# Patient Record
Sex: Female | Born: 1965 | Race: Black or African American | Hispanic: No | Smoking: Current every day smoker
Health system: Southern US, Community
[De-identification: ages and names within clinical notes are randomized; demographics above are authoritative.]

## PROBLEM LIST (undated history)

## (undated) VITALS — BP 93/71 | HR 100 | Temp 97.8°F | Resp 16 | Ht 62.0 in | Wt 146.0 lb

## (undated) DIAGNOSIS — R569 Unspecified convulsions: Secondary | ICD-10-CM

---

## 1997-12-31 ENCOUNTER — Emergency Department (HOSPITAL_COMMUNITY): Admission: EM | Admit: 1997-12-31 | Discharge: 1997-12-31 | Payer: Self-pay | Admitting: Emergency Medicine

## 1998-07-31 ENCOUNTER — Emergency Department (HOSPITAL_COMMUNITY): Admission: EM | Admit: 1998-07-31 | Discharge: 1998-07-31 | Payer: Self-pay | Admitting: Emergency Medicine

## 1998-08-19 ENCOUNTER — Emergency Department (HOSPITAL_COMMUNITY): Admission: EM | Admit: 1998-08-19 | Discharge: 1998-08-19 | Payer: Self-pay | Admitting: Emergency Medicine

## 1999-03-12 ENCOUNTER — Emergency Department (HOSPITAL_COMMUNITY): Admission: EM | Admit: 1999-03-12 | Discharge: 1999-03-12 | Payer: Self-pay | Admitting: Emergency Medicine

## 1999-03-12 ENCOUNTER — Encounter: Payer: Self-pay | Admitting: Emergency Medicine

## 2000-06-22 ENCOUNTER — Emergency Department (HOSPITAL_COMMUNITY): Admission: EM | Admit: 2000-06-22 | Discharge: 2000-06-22 | Payer: Self-pay | Admitting: Emergency Medicine

## 2003-09-05 ENCOUNTER — Emergency Department (HOSPITAL_COMMUNITY): Admission: EM | Admit: 2003-09-05 | Discharge: 2003-09-05 | Payer: Self-pay | Admitting: Emergency Medicine

## 2005-03-18 ENCOUNTER — Emergency Department (HOSPITAL_COMMUNITY): Admission: EM | Admit: 2005-03-18 | Discharge: 2005-03-18 | Payer: Self-pay | Admitting: Emergency Medicine

## 2005-08-03 ENCOUNTER — Emergency Department (HOSPITAL_COMMUNITY): Admission: EM | Admit: 2005-08-03 | Discharge: 2005-08-03 | Payer: Self-pay | Admitting: Emergency Medicine

## 2005-12-20 ENCOUNTER — Emergency Department (HOSPITAL_COMMUNITY): Admission: EM | Admit: 2005-12-20 | Discharge: 2005-12-20 | Payer: Self-pay | Admitting: Emergency Medicine

## 2006-09-19 ENCOUNTER — Emergency Department (HOSPITAL_COMMUNITY): Admission: EM | Admit: 2006-09-19 | Discharge: 2006-09-19 | Payer: Self-pay | Admitting: Emergency Medicine

## 2007-02-22 ENCOUNTER — Emergency Department (HOSPITAL_COMMUNITY): Admission: EM | Admit: 2007-02-22 | Discharge: 2007-02-22 | Payer: Self-pay | Admitting: *Deleted

## 2007-02-24 ENCOUNTER — Emergency Department (HOSPITAL_COMMUNITY): Admission: EM | Admit: 2007-02-24 | Discharge: 2007-02-24 | Payer: Self-pay | Admitting: Emergency Medicine

## 2007-04-02 ENCOUNTER — Emergency Department (HOSPITAL_COMMUNITY): Admission: EM | Admit: 2007-04-02 | Discharge: 2007-04-02 | Payer: Self-pay | Admitting: Emergency Medicine

## 2008-04-04 ENCOUNTER — Emergency Department (HOSPITAL_COMMUNITY): Admission: EM | Admit: 2008-04-04 | Discharge: 2008-04-04 | Payer: Self-pay | Admitting: Emergency Medicine

## 2008-04-14 ENCOUNTER — Emergency Department (HOSPITAL_COMMUNITY): Admission: EM | Admit: 2008-04-14 | Discharge: 2008-04-14 | Payer: Self-pay | Admitting: Emergency Medicine

## 2008-10-26 ENCOUNTER — Emergency Department (HOSPITAL_COMMUNITY): Admission: EM | Admit: 2008-10-26 | Discharge: 2008-10-26 | Payer: Self-pay | Admitting: Emergency Medicine

## 2010-12-25 LAB — URINALYSIS, ROUTINE W REFLEX MICROSCOPIC
Bilirubin Urine: NEGATIVE
Glucose, UA: NEGATIVE mg/dL
Hgb urine dipstick: NEGATIVE
Ketones, ur: NEGATIVE mg/dL
Nitrite: NEGATIVE
Protein, ur: NEGATIVE mg/dL
Specific Gravity, Urine: 1.022 (ref 1.005–1.030)
Urobilinogen, UA: 1 mg/dL (ref 0.0–1.0)
pH: 5.5 (ref 5.0–8.0)

## 2010-12-25 LAB — URINE MICROSCOPIC-ADD ON

## 2011-03-02 ENCOUNTER — Emergency Department (HOSPITAL_COMMUNITY)
Admission: EM | Admit: 2011-03-02 | Discharge: 2011-03-02 | Disposition: A | Discharge status: Home / Self Care | Payer: Self-pay | Attending: Emergency Medicine | Admitting: Emergency Medicine

## 2011-03-02 DIAGNOSIS — R6884 Jaw pain: Secondary | ICD-10-CM | POA: Insufficient documentation

## 2011-03-02 DIAGNOSIS — L2989 Other pruritus: Secondary | ICD-10-CM | POA: Insufficient documentation

## 2011-03-02 DIAGNOSIS — K029 Dental caries, unspecified: Secondary | ICD-10-CM | POA: Insufficient documentation

## 2011-03-02 DIAGNOSIS — R220 Localized swelling, mass and lump, head: Secondary | ICD-10-CM | POA: Insufficient documentation

## 2011-03-02 DIAGNOSIS — R599 Enlarged lymph nodes, unspecified: Secondary | ICD-10-CM | POA: Insufficient documentation

## 2011-03-02 DIAGNOSIS — K089 Disorder of teeth and supporting structures, unspecified: Secondary | ICD-10-CM | POA: Insufficient documentation

## 2011-03-02 DIAGNOSIS — L298 Other pruritus: Secondary | ICD-10-CM | POA: Insufficient documentation

## 2011-03-02 DIAGNOSIS — R059 Cough, unspecified: Secondary | ICD-10-CM | POA: Insufficient documentation

## 2011-03-02 DIAGNOSIS — R05 Cough: Secondary | ICD-10-CM | POA: Insufficient documentation

## 2011-06-24 LAB — RAPID STREP SCREEN (MED CTR MEBANE ONLY): Streptococcus, Group A Screen (Direct): POSITIVE — AB

## 2011-06-26 LAB — WOUND CULTURE

## 2011-10-03 ENCOUNTER — Encounter (HOSPITAL_COMMUNITY): Payer: Self-pay | Admitting: *Deleted

## 2011-10-03 ENCOUNTER — Emergency Department (HOSPITAL_COMMUNITY)
Admission: EM | Admit: 2011-10-03 | Discharge: 2011-10-03 | Disposition: A | Discharge status: Home / Self Care | Payer: Self-pay | Attending: Emergency Medicine | Admitting: Emergency Medicine

## 2011-10-03 DIAGNOSIS — K089 Disorder of teeth and supporting structures, unspecified: Secondary | ICD-10-CM | POA: Insufficient documentation

## 2011-10-03 DIAGNOSIS — K047 Periapical abscess without sinus: Secondary | ICD-10-CM | POA: Insufficient documentation

## 2011-10-03 DIAGNOSIS — R220 Localized swelling, mass and lump, head: Secondary | ICD-10-CM | POA: Insufficient documentation

## 2011-10-03 MED ORDER — PENICILLIN V POTASSIUM 500 MG PO TABS: 500.0000 mg | ORAL_TABLET | Freq: Three times a day (TID) | ORAL | Status: AC

## 2011-10-03 MED ORDER — HYDROCODONE-ACETAMINOPHEN 5-500 MG PO TABS: 1.0000 | ORAL_TABLET | Freq: Four times a day (QID) | ORAL | Status: AC | PRN

## 2011-10-03 NOTE — ED Notes (Signed)
Pt began having dental pain yesterday.  Pain in left lower jaw.  Pt has increased pain and swelling today and is not able to open her mouth for me to visulaize the affected tooth.  Pt denies any fever or chills with this.  No sob with this.  Pt reports 10/10 pain in this tooth.

## 2011-10-03 NOTE — ED Notes (Signed)
Pt states that she has facial swelling and a left lower molar pain. States that it is a "very bad cavity".  Unable to visualize tooth because she cannot open mouth face enough

## 2011-10-03 NOTE — ED Provider Notes (Signed)
History     CSN: [id number]  Arrival date & time 10/03/11  1223   First MD Initiated Contact with Patient 10/03/11 1359      Chief Complaint  Patient presents with  . Dental Pain    (Consider location/radiation/quality/duration/timing/severity/associated sxs/prior treatment) HPI Comments: Patient has been having a several week history of pain to her left lower tooth. Yesterday she started noticing some swelling to that area. Denies any nausea no vomiting no fevers no drainage from the area. She has not yet seen a dentist. She has been taking some Tylenol without relief.  Patient is a 46 y.o. female presenting with tooth pain. The history is provided by the patient.  Dental PainThe primary symptoms include mouth pain. Primary symptoms do not include headaches, fever or shortness of breath.    History reviewed. No pertinent past medical history.  History reviewed. No pertinent past surgical history.  No family history on file.  History  Substance Use Topics  . Smoking status: Current Everyday Smoker -- 0.5 packs/day    Types: Cigarettes  . Smokeless tobacco: Not on file  . Alcohol Use: Yes    OB History    Grav Para Term Preterm Abortions TAB SAB Ect Mult Living                  Review of Systems  Constitutional: Negative for fever.  HENT: Negative for congestion, rhinorrhea and sneezing.   Eyes: Negative.   Respiratory: Negative for shortness of breath.   Cardiovascular: Negative for chest pain.  Gastrointestinal: Negative for nausea, vomiting and abdominal pain.  Genitourinary: Negative.   Musculoskeletal: Negative.   Skin: Negative for rash.  Neurological: Negative for headaches.    Allergies  Review of patient's allergies indicates no known allergies.  Home Medications   Current Outpatient Rx  Name Route Sig Dispense Refill  . ACETAMINOPHEN 325 MG PO TABS Oral Take 650 mg by mouth every 6 (six) hours as needed. For pain    . HYDROCODONE-ACETAMINOPHEN  5-500 MG PO TABS Oral Take 1-2 tablets by mouth every 6 (six) hours as needed for pain. 15 tablet 0  . PENICILLIN V POTASSIUM 500 MG PO TABS Oral Take 1 tablet (500 mg total) by mouth 3 (three) times daily. 30 tablet 0    BP 138/91  Pulse 96  Temp(Src) 97.8 F (36.6 C) (Oral)  Resp 20  Ht 5' 2.5" (1.588 m)  Wt 151 lb (68.493 kg)  BMI 27.18 kg/m2  SpO2 97%  LMP 09/29/2010  Physical Exam  Constitutional: She is oriented to person, place, and time. She appears well-developed and well-nourished.  HENT:  Head: Normocephalic and atraumatic.       Positive tenderness and decayed tooth involving the left lower first molar. There is some swelling and induration around that area with some improvement in drainage around the tooth. There some mild facial swelling around this area no trismus uvula is midline.  Eyes: Pupils are equal, round, and reactive to light.  Neck: Normal range of motion. Neck supple.  Cardiovascular: Normal rate, regular rhythm and normal heart sounds.   Pulmonary/Chest: Effort normal and breath sounds normal. No respiratory distress.  Abdominal: Soft. Bowel sounds are normal. There is no tenderness.  Musculoskeletal: Normal range of motion. She exhibits no edema.  Lymphadenopathy:    She has no cervical adenopathy.  Neurological: She is alert and oriented to person, place, and time.  Skin: Skin is warm and dry. No rash noted.  Psychiatric: She has  a normal mood and affect.    ED Course  Procedures (including critical care time)  Labs Reviewed - No data to display No results found.   1. Dental abscess       MDM  Patient with small periapical abscess. Will start antibiotics pain medicines encourage followup with Dentist. Return for any worsening swelling or other worsening complaints        Rolan Bucco, MD 10/03/11 1413

## 2012-04-05 ENCOUNTER — Encounter (HOSPITAL_COMMUNITY): Payer: Self-pay | Admitting: Emergency Medicine

## 2012-04-05 ENCOUNTER — Emergency Department (HOSPITAL_COMMUNITY)
Admission: EM | Admit: 2012-04-05 | Discharge: 2012-04-05 | Disposition: A | Discharge status: Home / Self Care | Payer: Self-pay | Attending: Emergency Medicine | Admitting: Emergency Medicine

## 2012-04-05 DIAGNOSIS — F17200 Nicotine dependence, unspecified, uncomplicated: Secondary | ICD-10-CM | POA: Insufficient documentation

## 2012-04-05 DIAGNOSIS — M62838 Other muscle spasm: Secondary | ICD-10-CM | POA: Insufficient documentation

## 2012-04-05 DIAGNOSIS — R51 Headache: Secondary | ICD-10-CM | POA: Insufficient documentation

## 2012-04-05 MED ORDER — DIAZEPAM 5 MG PO TABS
5.0000 mg | ORAL_TABLET | Freq: Once | ORAL | Status: AC
  Administered 2012-04-05: 5 mg via ORAL
  Filled 2012-04-05: qty 1

## 2012-04-05 MED ORDER — DIAZEPAM 5 MG PO TABS: 5.0000 mg | ORAL_TABLET | Freq: Three times a day (TID) | ORAL | Status: AC | PRN

## 2012-04-05 NOTE — ED Provider Notes (Signed)
History     CSN: [id number]  Arrival date & time 04/05/12  0455   First MD Initiated Contact with Patient 04/05/12 [phone]      Chief Complaint  Patient presents with  . Neck Pain    (Consider location/radiation/quality/duration/timing/severity/associated sxs/prior treatment) HPI Comments: Patient reports she has had three weeks of left sided neck pain resulting in headache.  Headache is occipital.  Pain is worse with turning head from side to side, moving neck.  Has taken acetaminophen without relief.  States she has been very stressed recently and worried about her sister recently.  Denies fevers, trauma, recent illness, focal neurological deficits.    Patient is a 46 y.o. female presenting with neck pain. The history is provided by the patient and the spouse.  Neck Pain  Pertinent negatives include no chest pain.    History reviewed. No pertinent past medical history.  History reviewed. No pertinent past surgical history.  History reviewed. No pertinent family history.  History  Substance Use Topics  . Smoking status: Current Everyday Smoker -- 0.5 packs/day    Types: Cigarettes  . Smokeless tobacco: Not on file  . Alcohol Use: Yes    OB History    Grav Para Term Preterm Abortions TAB SAB Ect Mult Living                  Review of Systems  Constitutional: Negative for fever.  HENT: Positive for neck pain. Negative for sore throat and dental problem.   Respiratory: Negative for cough and shortness of breath.   Cardiovascular: Negative for chest pain.    Allergies  Review of patient's allergies indicates no known allergies.  Home Medications   Current Outpatient Rx  Name Route Sig Dispense Refill  . IBUPROFEN 200 MG PO TABS Oral Take 400 mg by mouth every 8 (eight) hours as needed. For pain      BP 133/97  Pulse 90  Temp 97.6 F (36.4 C) (Oral)  Resp 16  SpO2 100%  Physical Exam  Nursing note and vitals reviewed. Constitutional: She is oriented to  person, place, and time. She appears well-developed and well-nourished. No distress.  HENT:  Head: Normocephalic and atraumatic.    Mouth/Throat: Oropharynx is clear and moist. No oropharyngeal exudate.  Eyes: Conjunctivae are normal.  Neck: Normal range of motion. Neck supple.  Cardiovascular: Normal rate and regular rhythm.   Pulmonary/Chest: Effort normal and breath sounds normal. No stridor. No respiratory distress. She has no wheezes. She has no rales.  Musculoskeletal:       Cervical back: She exhibits no bony tenderness.       Thoracic back: She exhibits no bony tenderness.       Lumbar back: She exhibits no bony tenderness.  Lymphadenopathy:    She has no cervical adenopathy.  Neurological: She is alert and oriented to person, place, and time. She has normal strength. No sensory deficit. She exhibits normal muscle tone. Coordination and gait normal. GCS eye subscore is 4. GCS verbal subscore is 5. GCS motor subscore is 6.  Skin: She is not diaphoretic.    ED Course  Procedures (including critical care time)  Labs Reviewed - No data to display No results found.   1. Muscle spasm   2. Headache       MDM  Patient with muscle spasm of left neck radiating into occiput causing tension headache. No meningeal signs, no trauma.  Neurologically intact.  Ongoing for three weeks. No red flags  for headache.  Discussed plan and follow up with patient and spouse.  Pt given return precautions.  Pt verbalizes understanding and agrees with plan.           [place], [place] 04/05/12 [phone]

## 2012-04-05 NOTE — ED Notes (Signed)
Patient with neck pain and headache.  She states that it radiates from of her neck and up around to front.  No nausea or vomiting.  No double vision.

## 2012-04-05 NOTE — ED Provider Notes (Signed)
Medical screening examination/treatment/procedure(s) were performed by non-physician practitioner and as supervising physician I was immediately available for consultation/collaboration.  Olivia Mackie, MD 04/05/12 [phone]

## 2014-05-17 ENCOUNTER — Encounter (HOSPITAL_COMMUNITY): Payer: Self-pay | Admitting: Emergency Medicine

## 2014-05-17 ENCOUNTER — Emergency Department (HOSPITAL_COMMUNITY)
Admission: EM | Admit: 2014-05-17 | Discharge: 2014-05-17 | Discharge status: Against Medical Advice | Payer: BC Managed Care – PPO | Attending: Emergency Medicine | Admitting: Emergency Medicine

## 2014-05-17 DIAGNOSIS — K029 Dental caries, unspecified: Secondary | ICD-10-CM | POA: Insufficient documentation

## 2014-05-17 DIAGNOSIS — K089 Disorder of teeth and supporting structures, unspecified: Secondary | ICD-10-CM | POA: Diagnosis not present

## 2014-05-17 DIAGNOSIS — F17200 Nicotine dependence, unspecified, uncomplicated: Secondary | ICD-10-CM | POA: Diagnosis not present

## 2014-05-17 NOTE — ED Notes (Signed)
Pt. reports progressing left lower molar cavity/ pain onset 2 days ago .

## 2014-05-17 NOTE — ED Notes (Signed)
Patient turned in pager and left after triage and before being seen by a physician.

## 2015-06-25 ENCOUNTER — Emergency Department (HOSPITAL_COMMUNITY): Payer: BLUE CROSS/BLUE SHIELD

## 2015-06-25 ENCOUNTER — Emergency Department (HOSPITAL_COMMUNITY)
Admission: EM | Admit: 2015-06-25 | Discharge: 2015-06-25 | Disposition: A | Discharge status: Home / Self Care | Payer: BLUE CROSS/BLUE SHIELD | Attending: Emergency Medicine | Admitting: Emergency Medicine

## 2015-06-25 ENCOUNTER — Encounter (HOSPITAL_COMMUNITY): Payer: Self-pay | Admitting: Emergency Medicine

## 2015-06-25 DIAGNOSIS — X58XXXA Exposure to other specified factors, initial encounter: Secondary | ICD-10-CM | POA: Insufficient documentation

## 2015-06-25 DIAGNOSIS — Y9389 Activity, other specified: Secondary | ICD-10-CM | POA: Insufficient documentation

## 2015-06-25 DIAGNOSIS — Z720 Tobacco use: Secondary | ICD-10-CM | POA: Diagnosis not present

## 2015-06-25 DIAGNOSIS — Y9289 Other specified places as the place of occurrence of the external cause: Secondary | ICD-10-CM | POA: Insufficient documentation

## 2015-06-25 DIAGNOSIS — S8991XA Unspecified injury of right lower leg, initial encounter: Secondary | ICD-10-CM | POA: Insufficient documentation

## 2015-06-25 DIAGNOSIS — Y998 Other external cause status: Secondary | ICD-10-CM | POA: Insufficient documentation

## 2015-06-25 MED ORDER — TRAMADOL HCL 50 MG PO TABS: 50.0000 mg | ORAL_TABLET | Freq: Four times a day (QID) | ORAL | Status: DC | PRN

## 2015-06-25 MED ORDER — TRAMADOL HCL 50 MG PO TABS
50.0000 mg | ORAL_TABLET | Freq: Once | ORAL | Status: AC
  Administered 2015-06-25: 50 mg via ORAL
  Filled 2015-06-25: qty 1

## 2015-06-25 NOTE — ED Notes (Signed)
Hot pack applied to R knee per pt request

## 2015-06-25 NOTE — Discharge Instructions (Signed)
Please contact orthopedics immediately for follow-up evaluation. Please use medications only as directed, return to emergency room if Worsening signs or symptoms present

## 2015-06-25 NOTE — ED Provider Notes (Signed)
CSN: [id number]     Arrival date & time 06/25/15  0544 History   First MD Initiated Contact with Patient 06/25/15 [phone]     Chief Complaint  Patient presents with  . Knee Pain   HPI   49 year old female presents with knee pain. Patient reports that approximately one month ago she fell landing on her right anterior knee, she reports immediate pain and swelling at that time with difficulty ambulating. Notes that symptoms improved but did not completely resolved, but have progressively became worse over the last week. She reports pain, swelling, difficulty with walking. She states that she has continued to work, but this is becoming very difficult. She denies any loss of sensation, strength, function of the distal extremity other than those due to pain. Patient denies any significant past medical history of trauma to the right knee other than those noted above. Patient reports she is able to flex the knee to approximately 90 but has significant pain due to swelling. Patient reports she's been using icy hot at home without symptomatic improvement. Patient denies fever, chills, nausea, vomiting, or any other concerning signs or symptoms  History reviewed. No pertinent past medical history. History reviewed. No pertinent past surgical history. History reviewed. No pertinent family history. Social History  Substance Use Topics  . Smoking status: Current Every Day Smoker -- 1.00 packs/day    Types: Cigarettes  . Smokeless tobacco: None  . Alcohol Use: Yes     Comment: occasionally   OB History    No data available     Review of Systems  All other systems reviewed and are negative.   Allergies  Review of patient's allergies indicates no known allergies.  Home Medications   Prior to Admission medications   Medication Sig Start Date End Date Taking? Authorizing Provider  ibuprofen (ADVIL,MOTRIN) 200 MG tablet Take 400 mg by mouth every 8 (eight) hours as needed. For pain    Historical  Provider, MD  traMADol (ULTRAM) 50 MG tablet Take 1 tablet (50 mg total) by mouth every 6 (six) hours as needed. 06/25/15   Zakiah Beckerman, PA-C   BP 105/58 mmHg  Pulse 92  Temp(Src) 98.8 F (37.1 C) (Oral)  Resp 18  Ht 5\' 2"  (1.575 m)  Wt 153 lb (69.4 kg)  BMI 27.98 kg/m2  SpO2 97%   Physical Exam  Constitutional: She is oriented to person, place, and time. She appears well-developed and well-nourished.  HENT:  Head: Normocephalic and atraumatic.  Eyes: Conjunctivae are normal. Pupils are equal, round, and reactive to light. Right eye exhibits no discharge. Left eye exhibits no discharge. No scleral icterus.  Neck: Normal range of motion. No JVD present. No tracheal deviation present.  Pulmonary/Chest: Effort normal. No stridor.  Musculoskeletal:  Obvious swelling to the right knee, tenderness to palpation of the anterior knee diffusely. Difficult exam due to pain, unable to assess Lachman's or posterior drawer, no gross abnormalities noted. Valgus varus normal. Distal sensation, cap refill, strength intact. Pedal pulses 2+. Not warm to touch, no redness, no other signs of infection. Flexion 90  Neurological: She is alert and oriented to person, place, and time. Coordination normal.  Skin: Skin is warm and dry.  Psychiatric: She has a normal mood and affect. Her behavior is normal. Judgment and thought content normal.  Nursing note and vitals reviewed.   ED Course  Procedures (including critical care time) Labs Review Labs Reviewed - No data to display  Imaging Review Dg Knee Complete 4  Views Right  06/25/2015  CLINICAL DATA:  Right knee pain for 1 month EXAM: RIGHT KNEE - COMPLETE 4+ VIEW COMPARISON:  None. FINDINGS: There is no evidence of fracture, dislocation, or joint effusion. There is no evidence of arthropathy or other focal bone abnormality. Soft tissues are unremarkable. IMPRESSION: Negative. Electronically Signed   By: Burman Nieves M.D.   On: 06/25/2015 06:57    I have personally reviewed and evaluated these images and lab results as part of my medical decision-making.   EKG Interpretation None      MDM   Final diagnoses:  Knee injury, right, initial encounter    Labs:  Imaging: DG knee- No significant findings  Consults:  Therapeutics:  Discharge Meds: Tramadol, ibuprofen  Assessment/Plan:Patient presents with obvious knee injury. Unable to completely evaluate knee due to pain, suspect significant ligamentous or meniscus injury. No signs of septic or gouty joint. Patient will be placed in a knee immobilizer, given crutches, and orthopedic follow-up information. I discussed the importance of immediate follow-up with orthopedic as delaying follow-up could result in disability to the knee. She is instructed to use the knee immobilizer when walking, keep knee elevated and extended with no pillow under the knee but rather under the heel.. Patient requests to go back to work, she was given light duty. Patient struck to use medication only as directed, follow-up with orthopedics, she is given return precautions.         Eyvonne Mechanic, PA-C 06/25/15 0730  Geoffery Lyons, MD 06/25/15 1004

## 2015-06-25 NOTE — Progress Notes (Signed)
Orthopedic Tech Progress Note Patient Details:  Theresa Rios 08-27-1966 [id number]  Ortho Devices Type of Ortho Device: Knee Immobilizer, Crutches Ortho Device/Splint Interventions: Application   [name] 06/25/2015, 7:58 AM

## 2015-06-25 NOTE — ED Notes (Addendum)
Pt reports fall on RIGHT knee one month ago that caused pain and swelling, symptoms improved but now worse; pt reports pain, swelling, and difficulty walking today; CMS intact distally; pt ambulatory to room from triage

## 2015-11-09 ENCOUNTER — Emergency Department (HOSPITAL_COMMUNITY)
Admission: EM | Admit: 2015-11-09 | Discharge: 2015-11-10 | Disposition: A | Discharge status: Home / Self Care | Payer: BLUE CROSS/BLUE SHIELD | Attending: Emergency Medicine | Admitting: Emergency Medicine

## 2015-11-09 ENCOUNTER — Encounter (HOSPITAL_COMMUNITY): Payer: Self-pay | Admitting: *Deleted

## 2015-11-09 ENCOUNTER — Emergency Department (HOSPITAL_COMMUNITY): Payer: BLUE CROSS/BLUE SHIELD

## 2015-11-09 DIAGNOSIS — F17210 Nicotine dependence, cigarettes, uncomplicated: Secondary | ICD-10-CM | POA: Insufficient documentation

## 2015-11-09 DIAGNOSIS — J9801 Acute bronchospasm: Secondary | ICD-10-CM | POA: Insufficient documentation

## 2015-11-09 DIAGNOSIS — J069 Acute upper respiratory infection, unspecified: Secondary | ICD-10-CM | POA: Diagnosis not present

## 2015-11-09 DIAGNOSIS — S8991XA Unspecified injury of right lower leg, initial encounter: Secondary | ICD-10-CM | POA: Diagnosis not present

## 2015-11-09 DIAGNOSIS — Y998 Other external cause status: Secondary | ICD-10-CM | POA: Insufficient documentation

## 2015-11-09 DIAGNOSIS — R079 Chest pain, unspecified: Secondary | ICD-10-CM | POA: Diagnosis present

## 2015-11-09 DIAGNOSIS — Y9289 Other specified places as the place of occurrence of the external cause: Secondary | ICD-10-CM | POA: Insufficient documentation

## 2015-11-09 DIAGNOSIS — Y9389 Activity, other specified: Secondary | ICD-10-CM | POA: Insufficient documentation

## 2015-11-09 DIAGNOSIS — M25561 Pain in right knee: Secondary | ICD-10-CM

## 2015-11-09 DIAGNOSIS — R0789 Other chest pain: Secondary | ICD-10-CM

## 2015-11-09 DIAGNOSIS — W010XXA Fall on same level from slipping, tripping and stumbling without subsequent striking against object, initial encounter: Secondary | ICD-10-CM | POA: Diagnosis not present

## 2015-11-09 LAB — CBC
HCT: 45.8 % (ref 36.0–46.0)
Hemoglobin: 15 g/dL (ref 12.0–15.0)
MCH: 28.2 pg (ref 26.0–34.0)
MCHC: 32.8 g/dL (ref 30.0–36.0)
MCV: 86.3 fL (ref 78.0–100.0)
PLATELETS: 229 10*3/uL (ref 150–400)
RBC: 5.31 MIL/uL — AB (ref 3.87–5.11)
RDW: 13.5 % (ref 11.5–15.5)
WBC: 10.4 10*3/uL (ref 4.0–10.5)

## 2015-11-09 LAB — BASIC METABOLIC PANEL
Anion gap: 12 (ref 5–15)
BUN: 10 mg/dL (ref 6–20)
CALCIUM: 9.4 mg/dL (ref 8.9–10.3)
CHLORIDE: 97 mmol/L — AB (ref 101–111)
CO2: 24 mmol/L (ref 22–32)
CREATININE: 0.75 mg/dL (ref 0.44–1.00)
GFR calc non Af Amer: >60 mL/min (ref >60)
Glucose, Bld: 123 mg/dL — ABNORMAL HIGH (ref 65–99)
Potassium: 3.8 mmol/L (ref 3.5–5.1)
SODIUM: 133 mmol/L — AB (ref 135–145)

## 2015-11-09 LAB — I-STAT TROPONIN, ED: TROPONIN I, POC: 0 ng/mL (ref 0.00–0.08)

## 2015-11-09 NOTE — ED Notes (Signed)
Pt complains of pain in her right knee for the past 2 weeks. Pt also complains of cough and chest tightness for the past 2 weeks. Pt states she has been coughing green mucus for the past 2 weeks.

## 2015-11-09 NOTE — ED Notes (Signed)
Holding off on EKG due to extended cough

## 2015-11-10 MED ORDER — ALBUTEROL SULFATE HFA 108 (90 BASE) MCG/ACT IN AERS
4.0000 | INHALATION_SPRAY | Freq: Once | RESPIRATORY_TRACT | Status: AC
  Administered 2015-11-10: 4 via RESPIRATORY_TRACT
  Filled 2015-11-10: qty 6.7

## 2015-11-10 MED ORDER — METHYLPREDNISOLONE SODIUM SUCC 125 MG IJ SOLR
125.0000 mg | Freq: Once | INTRAMUSCULAR | Status: AC
  Administered 2015-11-10: 125 mg via INTRAVENOUS
  Filled 2015-11-10: qty 2

## 2015-11-10 MED ORDER — KETOROLAC TROMETHAMINE 30 MG/ML IJ SOLN
30.0000 mg | Freq: Once | INTRAMUSCULAR | Status: AC
  Administered 2015-11-10: 30 mg via INTRAVENOUS
  Filled 2015-11-10: qty 1

## 2015-11-10 MED ORDER — GUAIFENESIN-CODEINE 100-10 MG/5ML PO SOLN
10.0000 mL | Freq: Once | ORAL | Status: AC
  Administered 2015-11-10: 10 mL via ORAL
  Filled 2015-11-10: qty 10

## 2015-11-10 MED ORDER — IBUPROFEN 800 MG PO TABS: 800.0000 mg | ORAL_TABLET | Freq: Three times a day (TID) | ORAL | Status: DC | PRN

## 2015-11-10 MED ORDER — GUAIFENESIN-CODEINE 100-10 MG/5ML PO SOLN: 5.0000 mL | Freq: Four times a day (QID) | ORAL | Status: DC | PRN

## 2015-11-10 MED ORDER — PREDNISONE 20 MG PO TABS: 60.0000 mg | ORAL_TABLET | Freq: Every day | ORAL | Status: DC

## 2015-11-10 NOTE — Discharge Instructions (Signed)
You may use your albuterol inhaler 2 to 4 puffs every 2 to 4 hours as needed for wheezing, cough, shortness of breath.   Bronchospasm, Adult A bronchospasm is a spasm or tightening of the airways going into the lungs. During a bronchospasm breathing becomes more difficult because the airways get smaller. When this happens there can be coughing, a whistling sound when breathing (wheezing), and difficulty breathing. Bronchospasm is often associated with asthma, but not all patients who experience a bronchospasm have asthma. CAUSES  A bronchospasm is caused by inflammation or irritation of the airways. The inflammation or irritation may be triggered by:   Allergies (such as to animals, pollen, food, or mold). Allergens that cause bronchospasm may cause wheezing immediately after exposure or many hours later.   Infection. Viral infections are believed to be the most common cause of bronchospasm.   Exercise.   Irritants (such as pollution, cigarette smoke, strong odors, aerosol sprays, and paint fumes).   Weather changes. Winds increase molds and pollens in the air. Rain refreshes the air by washing irritants out. Cold air may cause inflammation.   Stress and emotional upset.  SIGNS AND SYMPTOMS   Wheezing.   Excessive nighttime coughing.   Frequent or severe coughing with a simple cold.   Chest tightness.   Shortness of breath.  DIAGNOSIS  Bronchospasm is usually diagnosed through a history and physical exam. Tests, such as chest X-rays, are sometimes done to look for other conditions. TREATMENT   Inhaled medicines can be given to open up your airways and help you breathe. The medicines can be given using either an inhaler or a nebulizer machine.  Corticosteroid medicines may be given for severe bronchospasm, usually when it is associated with asthma. HOME CARE INSTRUCTIONS   Always have a plan prepared for seeking medical care. Know when to call your health care  provider and local emergency services (911 in the [place] Know where you can access local emergency care.  Only take medicines as directed by your health care provider.  If you were prescribed an inhaler or nebulizer machine, ask your health care provider to explain how to use it correctly. Always use a spacer with your inhaler if you were given one.  It is necessary to remain calm during an attack. Try to relax and breathe more slowly.  Control your home environment in the following ways:   Change your heating and air conditioning filter at least once a month.   Limit your use of fireplaces and wood stoves.  Do not smoke and do not allow smoking in your home.   Avoid exposure to perfumes and fragrances.   Get rid of pests (such as roaches and mice) and their droppings.   Throw away plants if you see mold on them.   Keep your house clean and dust free.   Replace carpet with wood, tile, or vinyl flooring. Carpet can trap dander and dust.   Use allergy-proof pillows, mattress covers, and box spring covers.   Wash bed sheets and blankets every week in hot water and dry them in a dryer.   Use blankets that are made of polyester or cotton.   Wash hands frequently. SEEK MEDICAL CARE IF:   You have muscle aches.   You have chest pain.   The sputum changes from clear or white to yellow, green, gray, or bloody.   The sputum you cough up gets thicker.   There are problems that may be related to the medicine you  are given, such as a rash, itching, swelling, or trouble breathing.  SEEK IMMEDIATE MEDICAL CARE IF:   You have worsening wheezing and coughing even after taking your prescribed medicines.   You have increased difficulty breathing.   You develop severe chest pain. MAKE SURE YOU:   Understand these instructions.  Will watch your condition.  Will get help right away if you are not doing well or get worse.   This information is not intended to  replace advice given to you by your health care provider. Make sure you discuss any questions you have with your health care provider.   Document Released: 08/29/2003 Document Revised: 09/16/2014 Document Reviewed: 02/15/2013 Elsevier Interactive Patient Education 2016 Elsevier Inc[employer]  Upper Respiratory Infection, Adult Most upper respiratory infections (URIs) are a viral infection of the air passages leading to the lungs. A URI affects the nose, throat, and upper air passages. The most common type of URI is nasopharyngitis and is typically referred to as "the common cold." URIs run their course and usually go away on their own. Most of the time, a URI does not require medical attention, but sometimes a bacterial infection in the upper airways can follow a viral infection. This is called a secondary infection. Sinus and middle ear infections are common types of secondary upper respiratory infections. Bacterial pneumonia can also complicate a URI. A URI can worsen asthma and chronic obstructive pulmonary disease (COPD). Sometimes, these complications can require emergency medical care and may be life threatening.  CAUSES Almost all URIs are caused by viruses. A virus is a type of germ and can spread from one person to another.  RISKS FACTORS You may be at risk for a URI if:   You smoke.   You have chronic heart or lung disease.  You have a weakened defense (immune) system.   You are very young or very old.   You have nasal allergies or asthma.  You work in crowded or poorly ventilated areas.  You work in health care facilities or schools. SIGNS AND SYMPTOMS  Symptoms typically develop 2-3 days after you come in contact with a cold virus. Most viral URIs last 7-10 days. However, viral URIs from the influenza virus (flu virus) can last 14-18 days and are typically more severe. Symptoms may include:   Runny or stuffy (congested) nose.   Sneezing.   Cough.   Sore throat.    Headache.   Fatigue.   Fever.   Loss of appetite.   Pain in your forehead, behind your eyes, and over your cheekbones (sinus pain).  Muscle aches.  DIAGNOSIS  Your health care provider may diagnose a URI by:  Physical exam.  Tests to check that your symptoms are not due to another condition such as:  Strep throat.  Sinusitis.  Pneumonia.  Asthma. TREATMENT  A URI goes away on its own with time. It cannot be cured with medicines, but medicines may be prescribed or recommended to relieve symptoms. Medicines may help:  Reduce your fever.  Reduce your cough.  Relieve nasal congestion. HOME CARE INSTRUCTIONS   Take medicines only as directed by your health care provider.   Gargle warm saltwater or take cough drops to comfort your throat as directed by your health care provider.  Use a warm mist humidifier or inhale steam from a shower to increase air moisture. This may make it easier to breathe.  Drink enough fluid to keep your urine clear or pale yellow.   Eat soups  and other clear broths and maintain good nutrition.   Rest as needed.   Return to work when your temperature has returned to normal or as your health care provider advises. You may need to stay home longer to avoid infecting others. You can also use a face mask and careful hand washing to prevent spread of the virus.  Increase the usage of your inhaler if you have asthma.   Do not use any tobacco products, including cigarettes, chewing tobacco, or electronic cigarettes. If you need help quitting, ask your health care provider. PREVENTION  The best way to protect yourself from getting a cold is to practice good hygiene.   Avoid oral or hand contact with people with cold symptoms.   Wash your hands often if contact occurs.  There is no clear evidence that vitamin C, vitamin E, echinacea, or exercise reduces the chance of developing a cold. However, it is always recommended to get plenty  of rest, exercise, and practice good nutrition.  SEEK MEDICAL CARE IF:   You are getting worse rather than better.   Your symptoms are not controlled by medicine.   You have chills.  You have worsening shortness of breath.  You have brown or red mucus.  You have yellow or brown nasal discharge.  You have pain in your face, especially when you bend forward.  You have a fever.  You have swollen neck glands.  You have pain while swallowing.  You have white areas in the back of your throat. SEEK IMMEDIATE MEDICAL CARE IF:   You have severe or persistent:  Headache.  Ear pain.  Sinus pain.  Chest pain.  You have chronic lung disease and any of the following:  Wheezing.  Prolonged cough.  Coughing up blood.  A change in your usual mucus.  You have a stiff neck.  You have changes in your:  Vision.  Hearing.  Thinking.  Mood. MAKE SURE YOU:   Understand these instructions.  Will watch your condition.  Will get help right away if you are not doing well or get worse.   This information is not intended to replace advice given to you by your health care provider. Make sure you discuss any questions you have with your health care provider.   Document Released: 02/19/2001 Document Revised: 01/10/2015 Document Reviewed: 12/01/2013 Elsevier Interactive Patient Education 2016 [employer] Smoking Cessation, Tips for Success If you are ready to quit smoking, congratulations! You have chosen to help yourself be healthier. Cigarettes bring nicotine, tar, carbon monoxide, and other irritants into your body. Your lungs, heart, and blood vessels will be able to work better without these poisons. There are many different ways to quit smoking. Nicotine gum, nicotine patches, a nicotine inhaler, or nicotine nasal spray can help with physical craving. Hypnosis, support groups, and medicines help break the habit of smoking. WHAT THINGS CAN I DO TO MAKE QUITTING EASIER?   Here are some tips to help you quit for good:  Pick a date when you will quit smoking completely. Tell all of your friends and family about your plan to quit on that date.  Do not try to slowly cut down on the number of cigarettes you are smoking. Pick a quit date and quit smoking completely starting on that day.  Throw away all cigarettes.   Clean and remove all ashtrays from your home, work, and car.  On a card, write down your reasons for quitting. Carry the card with you and read  it when you get the urge to smoke.  Cleanse your body of nicotine. Drink enough water and fluids to keep your urine clear or pale yellow. Do this after quitting to flush the nicotine from your body.  Learn to predict your moods. Do not let a bad situation be your excuse to have a cigarette. Some situations in your life might tempt you into wanting a cigarette.  Never have "just one" cigarette. It leads to wanting another and another. Remind yourself of your decision to quit.  Change habits associated with smoking. If you smoked while driving or when feeling stressed, try other activities to replace smoking. Stand up when drinking your coffee. Brush your teeth after eating. Sit in a different chair when you read the paper. Avoid alcohol while trying to quit, and try to drink fewer caffeinated beverages. Alcohol and caffeine may urge you to smoke.  Avoid foods and drinks that can trigger a desire to smoke, such as sugary or spicy foods and alcohol.  Ask people who smoke not to smoke around you.  Have something planned to do right after eating or having a cup of coffee. For example, plan to take a walk or exercise.  Try a relaxation exercise to calm you down and decrease your stress. Remember, you may be tense and nervous for the first 2 weeks after you quit, but this will pass.  Find new activities to keep your hands busy. Play with a pen, coin, or rubber band. Doodle or draw things on paper.  Brush your  teeth right after eating. This will help cut down on the craving for the taste of tobacco after meals. You can also try mouthwash.   Use oral substitutes in place of cigarettes. Try using lemon drops, carrots, cinnamon sticks, or chewing gum. Keep them handy so they are available when you have the urge to smoke.  When you have the urge to smoke, try deep breathing.  Designate your home as a nonsmoking area.  If you are a heavy smoker, ask your health care provider about a prescription for nicotine chewing gum. It can ease your withdrawal from nicotine.  Reward yourself. Set aside the cigarette money you save and buy yourself something nice.  Look for support from others. Join a support group or smoking cessation program. Ask someone at home or at work to help you with your plan to quit smoking.  Always ask yourself, "Do I need this cigarette or is this just a reflex?" Tell yourself, "Today, I choose not to smoke," or "I do not want to smoke." You are reminding yourself of your decision to quit.  Do not replace cigarette smoking with electronic cigarettes (commonly called e-cigarettes). The safety of e-cigarettes is unknown, and some may contain harmful chemicals.  If you relapse, do not give up! Plan ahead and think about what you will do the next time you get the urge to smoke. HOW WILL I FEEL WHEN I QUIT SMOKING? You may have symptoms of withdrawal because your body is used to nicotine (the addictive substance in cigarettes). You may crave cigarettes, be irritable, feel very hungry, cough often, get headaches, or have difficulty concentrating. The withdrawal symptoms are only temporary. They are strongest when you first quit but will go away within 10-14 days. When withdrawal symptoms occur, stay in control. Think about your reasons for quitting. Remind yourself that these are signs that your body is healing and getting used to being without cigarettes. Remember that withdrawal symptoms are  easier to treat than the major diseases that smoking can cause.  Even after the withdrawal is over, expect periodic urges to smoke. However, these cravings are generally short lived and will go away whether you smoke or not. Do not smoke! WHAT RESOURCES ARE AVAILABLE TO HELP ME QUIT SMOKING? Your health care provider can direct you to community resources or hospitals for support, which may include:  Group support.  Education.  Hypnosis.  Therapy.   This information is not intended to replace advice given to you by your health care provider. Make sure you discuss any questions you have with your health care provider.   Document Released: 05/24/2004 Document Revised: 09/16/2014 Document Reviewed: 02/11/2013 Elsevier Interactive Patient Education 2016 Elsevier Inc.    Knee Pain Knee pain is a very common symptom and can have many causes. Knee pain often goes away when you follow your health care provider's instructions for relieving pain and discomfort at home. However, knee pain can develop into a condition that needs treatment. Some conditions may include:  Arthritis caused by wear and tear (osteoarthritis).  Arthritis caused by swelling and irritation (rheumatoid arthritis or gout).  A cyst or growth in your knee.  An infection in your knee joint.  An injury that will not heal.  Damage, swelling, or irritation of the tissues that support your knee (torn ligaments or tendinitis). If your knee pain continues, additional tests may be ordered to diagnose your condition. Tests may include X-rays or other imaging studies of your knee. You may also need to have fluid removed from your knee. Treatment for ongoing knee pain depends on the cause, but treatment may include:  Medicines to relieve pain or swelling.  Steroid injections in your knee.  Physical therapy.  Surgery. HOME CARE INSTRUCTIONS  Take medicines only as directed by your health care provider.  Rest your knee and  keep it raised (elevated) while you are resting.  Do not do things that cause or worsen pain.  Avoid high-impact activities or exercises, such as running, jumping rope, or doing jumping jacks.  Apply ice to the knee area:  Put ice in a plastic bag.  Place a towel between your skin and the bag.  Leave the ice on for 20 minutes, 2-3 times a day.  Ask your health care provider if you should wear an elastic knee support.  Keep a pillow under your knee when you sleep.  Lose weight if you are overweight. Extra weight can put pressure on your knee.  Do not use any tobacco products, including cigarettes, chewing tobacco, or electronic cigarettes. If you need help quitting, ask your health care provider. Smoking may slow the healing of any bone and joint problems that you may have. SEEK MEDICAL CARE IF:  Your knee pain continues, changes, or gets worse.  You have a fever along with knee pain.  Your knee buckles or locks up.  Your knee becomes more swollen. SEEK IMMEDIATE MEDICAL CARE IF:   Your knee joint feels hot to the touch.  You have chest pain or trouble breathing.   This information is not intended to replace advice given to you by your health care provider. Make sure you discuss any questions you have with your health care provider.   Document Released: 06/23/2007 Document Revised: 09/16/2014 Document Reviewed: 04/11/2014 Elsevier Interactive Patient Education 2016 Elsevier Inc.   RICE for Routine Care of Injuries Theroutine careofmanyinjuriesincludes rest, ice, compression, and elevation (RICE therapy). RICE therapy is often recommended for injuries  to soft tissues, such as a muscle strain, ligament injuries, bruises, and overuse injuries. It can also be used for some bony injuries. Using RICE therapy can help to relieve pain, lessen swelling, and enable your body to heal. Rest Rest is required to allow your body to heal. This usually involves reducing your normal  activities and avoiding use of the injured part of your body. Generally, you can return to your normal activities when you are comfortable and have been given permission by your health care provider. Ice Icing your injury helps to keep the swelling down, and it lessens pain. Do not apply ice directly to your skin.  Put ice in a plastic bag.  Place a towel between your skin and the bag.  Leave the ice on for 20 minutes, 2-3 times a day. Do this for as long as you are directed by your health care provider. Compression Compression means putting pressure on the injured area. Compression helps to keep swelling down, gives support, and helps with discomfort. Compression may be done with an elastic bandage. If an elastic bandage has been applied, follow these general tips:  Remove and reapply the bandage every 3-4 hours or as directed by your health care provider.  Make sure the bandage is not wrapped too tightly, because this can cut off circulation. If part of your body beyond the bandage becomes blue, numb, cold, swollen, or more painful, your bandage is most likely too tight. If this occurs, remove your bandage and reapply it more loosely.  See your health care provider if the bandage seems to be making your problems worse rather than better. Elevation Elevation means keeping the injured area raised. This helps to lessen swelling and decrease pain. If possible, your injured area should be elevated at or above the level of your heart or the center of your chest. WHEN SHOULD I SEEK MEDICAL CARE? You should seek medical care if:  Your pain and swelling continue.  Your symptoms are getting worse rather than improving. These symptoms may indicate that further evaluation or further X-rays are needed. Sometimes, X-rays may not show a small broken bone (fracture) until a number of days later. Make a follow-up appointment with your health care provider. WHEN SHOULD I SEEK IMMEDIATE MEDICAL CARE? You  should seek immediate medical care if:  You have sudden severe pain at or below the area of your injury.  You have redness or increased swelling around your injury.  You have tingling or numbness at or below the area of your injury that does not improve after you remove the elastic bandage.   This information is not intended to replace advice given to you by your health care provider. Make sure you discuss any questions you have with your health care provider.   Document Released: 12/08/2000 Document Revised: 05/17/2015 Document Reviewed: 08/03/2014 Elsevier Interactive Patient Education 2016 [employer].  [facility] - Urgent Care Centers    Other Local Resources (Updated 09/2015)  Urgent Care Centers   Hours of Operation and Other Information    Address and Phone Number   at [facility]  Hours: Monday - Friday, 7 am - 8 pm, Saturday and Sunday, 8 am - 4 pm  Accepts Medicare, Medicaid, and private insurance  Offers sliding scale for uninsured [phone] [address] [place], [place] [address]   [facility], [place] [facility]    Hours: Monday - Friday, 8 am - 8 pm, Saturday 9 am - 6 pm, and Sunday  11 am - 6 pm  Accepts Medicare, Medicaid, and private insurance  Offers sliding scale for uninsured [phone] [address] [place], [place] [address]  [facility]   Hours: Monday - Sunday, 1 pm - 8 pm  Accepts Medicare, Medicaid, and private insurance  Offers sliding scale for uninsured [phone] [address] [place], [place] [address]   [facility]   Hours: Monday - Thursday, 8 am - 8:30 pm, Friday 8 am - 6 pm, Saturday and Sunday 8 am - 4 pm  Accepts Medicare, Medicaid, and private insurance  Offers sliding scale for uninsured [phone] [address], [place], [place]

## 2015-11-10 NOTE — ED Provider Notes (Signed)
By signing my name below, I, Theresa Rios, attest that this documentation has been prepared under the direction and in the presence of Antion Andres N Masayo Fera, DO. Electronically Signed: Bethel BornBritney Rios, ED Scribe. 11/10/2015. 12:49 AM.  TIME SEEN: 12:41 AM  CHIEF COMPLAINT: right knee pain; cough  HPI: Theresa Rios is a 50 y.o. female who presents to the Emergency Department complaining of intermittent, moderate,  right knee pain with onset 1 month ago after a fall where she slipped and fell onto her right knee. Did not hit her head. She was not seen after the fall because she thought the pain would go away. Worse with walking.  Is not taking anything at home.  Denies calf tenderness or swelling.   Pt also complains of chest tightness, a cough productive of green sputum, and sore throat with onset 2 weeks ago. She had 3 episodes of diarrhea today. Pt denies SOB, LE swelling, and vomiting. Chest pain only present with coughing. She did not have a flu shot this year. Pt is a smoker.  No history of DVT/PE. No recent long travel by car or plane, surgery or hospitalization, bone fracture, or hormonal therapy.   ROS: See HPI Constitutional: no fever  Eyes: no drainage  ENT: no runny nose   Cardiovascular:  chest pain  Resp: no SOB  GI: no vomiting GU: no dysuria Integumentary: no rash  Allergy: no hives  Musculoskeletal: no leg swelling  Neurological: no slurred speech ROS otherwise negative  PAST MEDICAL HISTORY/PAST SURGICAL HISTORY:  History reviewed. No pertinent past medical history.  MEDICATIONS:  Prior to Admission medications   Medication Sig Start Date End Date Taking? Authorizing Provider  ibuprofen (ADVIL,MOTRIN) 200 MG tablet Take 400 mg by mouth every 8 (eight) hours as needed. For pain   Yes Historical Provider, MD  traMADol (ULTRAM) 50 MG tablet Take 1 tablet (50 mg total) by mouth every 6 (six) hours as needed. Patient not taking: Reported on 11/09/2015 06/25/15   Eyvonne MechanicJeffrey  Hedges, PA-C    ALLERGIES:  No Known Allergies  SOCIAL HISTORY:  Social History  Substance Use Topics  . Smoking status: Current Every Day Smoker -- 1.00 packs/day    Types: Cigarettes  . Smokeless tobacco: Not on file  . Alcohol Use: Yes     Comment: occasionally    FAMILY HISTORY: No family history on file.  EXAM: BP 120/75 mmHg  Pulse 101  Temp(Src) 98.6 F (37 C) (Oral)  Resp 18  SpO2 99% CONSTITUTIONAL: Alert and oriented and responds appropriately to questions. Well-appearing; well-nourished HEAD: Normocephalic EYES: Conjunctivae clear, PERRL ENT: normal nose; no rhinorrhea; moist mucous membranes NECK: Supple, no meningismus, no LAD  CARD: RRR; S1 and S2 appreciated; no murmurs, no clicks, no rubs, no gallops RESP: Normal chest excursion without splinting or tachypnea; breath sounds equal bilaterally; scattered expiratory wheezing, non productive cough,  no rhonchi, no rales, no hypoxia or respiratory distress, speaking full sentences ABD/GI: Normal bowel sounds; non-distended; soft, non-tender, no rebound, no guarding, no peritoneal signs BACK:  The back appears normal and is non-tender to palpation, there is no CVA tenderness EXT: TTP over the medial joint line of the right knee with a small joint effusion, no erythema or warmth, no ligamentous laxity, 2+ DP pulse on the right,  Normal ROM in all joints; otherwise extremities are  non-tender to palpation; no edema; normal capillary refill; no cyanosis, no calf tenderness or swelling    SKIN: Normal color for age and race; warm;  no rash NEURO: Moves all extremities equally, sensation to light touch intact diffusely, cranial nerves II through XII intact PSYCH: The patient's mood and manner are appropriate. Grooming and personal hygiene are appropriate.  MEDICAL DECISION MAKING: Pt here with two separate complaints.  First patient is complaining of right knee pain. Tender over the medial joint line with small joint  effusion. Doubt septic arthritis, gout. No calf tenderness or swelling. Neurovascular intact distally. X-ray shows no fracture dislocation. Advised her to use ibuprofen, ice and elevation. She is requesting crutches. We'll provide knee sleeve. No ligamentous laxity on exam. Will give orthopedic follow-up.   As for patient's chest tightness suspect this is secondary to viral illness. Chest x-ray shows no infiltrate. Atypical for ACS or pulmonary embolus. EKG shows no ischemic abdomen or melena and troponin is negative. Will treat symptomatically with albuterol, Solu-Medrol, guaifenesin with codeine. Will give dose of 4 Toradol for her pain. I do not feel she needs further emergent workup. I do not feel she needs antibiotics. I feel she is safe to be discharged home. Provided with outpatient resources for follow-up. Discussed return precautions. She verbalizes understanding and is comfortable with this plan.  EKG Interpretation  Date/Time:  Thursday November 09 2015 20:20:33 EST Ventricular Rate:  97 PR Interval:  139 QRS Duration: 93 QT Interval:  359 QTC Calculation: 456 R Axis:   -15 Text Interpretation:  Sinus rhythm Left atrial enlargement Left ventricular hypertrophy Nonspecific T abnormalities, diffuse leads New since previous tracing Baseline wander in lead(s) V4 Confirmed by Ethelda Chick  MD, SAM [phone]) on 11/09/2015 8:30:17 PM        I personally performed the services described in this documentation, which was scribed in my presence. The recorded information has been reviewed and is accurate.     Layla Maw Rylinn Linzy, DO 11/10/15 [phone]

## 2016-04-29 ENCOUNTER — Emergency Department (HOSPITAL_COMMUNITY): Payer: BLUE CROSS/BLUE SHIELD

## 2016-04-29 ENCOUNTER — Encounter (HOSPITAL_COMMUNITY): Payer: Self-pay

## 2016-04-29 ENCOUNTER — Emergency Department (HOSPITAL_COMMUNITY)
Admission: EM | Admit: 2016-04-29 | Discharge: 2016-04-29 | Disposition: A | Discharge status: Home / Self Care | Payer: BLUE CROSS/BLUE SHIELD | Attending: Emergency Medicine | Admitting: Emergency Medicine

## 2016-04-29 DIAGNOSIS — Z791 Long term (current) use of non-steroidal anti-inflammatories (NSAID): Secondary | ICD-10-CM | POA: Insufficient documentation

## 2016-04-29 DIAGNOSIS — M79672 Pain in left foot: Secondary | ICD-10-CM | POA: Diagnosis present

## 2016-04-29 DIAGNOSIS — F17210 Nicotine dependence, cigarettes, uncomplicated: Secondary | ICD-10-CM | POA: Insufficient documentation

## 2016-04-29 DIAGNOSIS — M722 Plantar fascial fibromatosis: Secondary | ICD-10-CM | POA: Insufficient documentation

## 2016-04-29 MED ORDER — NAPROXEN 500 MG PO TABS: 500.0000 mg | ORAL_TABLET | Freq: Two times a day (BID) | ORAL | 0 refills | Status: DC | PRN

## 2016-04-29 MED ORDER — NAPROXEN 500 MG PO TABS
500.0000 mg | ORAL_TABLET | Freq: Once | ORAL | Status: AC
  Administered 2016-04-29: 500 mg via ORAL
  Filled 2016-04-29: qty 1

## 2016-04-29 NOTE — Discharge Instructions (Signed)
Ice affected area with frozen water bottle as discussed multiple times throughout the day. Avoid prolonged standing as possible over the next few days. If no improvement in the next week, follow up with the podiatrist (foot doctor) listed. Return to ER for new or worsening symptoms, any additional concerns.

## 2016-04-29 NOTE — ED Provider Notes (Signed)
[facility]-EMERGENCY DEPT Provider Note   CSN: [id number] Arrival date & time: 04/29/16  1154  By [id number] my name below, I, Soijett Blue, attest that this documentation has been prepared under the direction and in the presence of Elizabeth SauerJaime Ward, PA-C Electronically Signed: Soijett Blue, ED Scribe. 04/29/16. 1:56 PM.   History   Chief Complaint Chief Complaint  Patient presents with  . Foot Pain    HPI Theresa Rios is a 50 y.o. female  who presents to the Emergency Department complaining of left foot pain onset 2 weeks. Pt notes that her left foot pain is worsened with ambulation and with the first steps of the day. Pt left foot pain is alleviated mildly with using a frozen water bottle to roll her left foot over. Pt denies having these symptoms in the past. Pt states that she stands for prolonged periods at her job where she has works 12 hour shifts. She has worked this same job for 12 years. Pt denies any recent injury or falls. No increase in activity. Pt is having associated symptoms of gait problem due to pain. She notes that she has tried ibuprofen for the relief of her symptoms. She denies color change, wound, rash, swelling, and any other symptoms. Pt denies having a PCP or ever seeing a provider for her symptoms.    The history is provided by the patient. No language interpreter was used.    History reviewed. No pertinent past medical history.  There are no active problems to display for this patient.   History reviewed. No pertinent surgical history.  OB History    No data available       Home Medications    Prior to Admission medications   Medication Sig Start Date End Date Taking? Authorizing Provider  guaiFENesin-codeine 100-10 MG/5ML syrup Take 5-10 mLs by mouth every 6 (six) hours as needed for cough. 11/10/15   Kristen N Ward, DO  ibuprofen (ADVIL,MOTRIN) 800 MG tablet Take 1 tablet (800 mg total) by mouth every 8 (eight) hours as needed for mild pain. 11/10/15   Kristen N  Ward, DO  naproxen (NAPROSYN) 500 MG tablet Take 1 tablet (500 mg total) by mouth 2 (two) times daily as needed. 04/29/16   Chase PicketJaime Pilcher Ward, PA-C  predniSONE (DELTASONE) 20 MG tablet Take 3 tablets (60 mg total) by mouth daily. 11/10/15   Layla MawKristen N Ward, DO    Family History History reviewed. No pertinent family history.  Social History Social History  Substance Use Topics  . Smoking status: Current Every Day Smoker    Packs/day: 1.00    Types: Cigarettes  . Smokeless tobacco: Never Used  . Alcohol use Yes     Comment: occasionally     Allergies   Review of patient's allergies indicates no known allergies.   Review of Systems Review of Systems  Musculoskeletal: Positive for arthralgias (right foot) and gait problem (due to pain). Negative for joint swelling.  Skin: Negative for color change, rash and wound.     Physical Exam Updated Vital Signs BP 135/99 (BP Location: Left Arm)   Pulse 89   Temp 98.6 F (37 C) (Oral)   Resp 16   SpO2 100%   Physical Exam  Constitutional: She is oriented to person, place, and time. She appears well-developed and well-nourished. No distress.  HENT:  Head: Normocephalic and atraumatic.  Cardiovascular: Normal rate, regular rhythm, normal heart sounds and intact distal pulses.  Exam reveals no gallop and no friction rub.  No murmur heard. Pulmonary/Chest: Effort normal and breath sounds normal. No respiratory distress. She has no wheezes. She has no rales. She exhibits no tenderness.  Musculoskeletal: She exhibits no edema.  FROM of left foot able to wiggle toes. NVI. Full strength. Diffuse TTP of the plantar surface of foot from heel to the ball of foot.   Neurological: She is alert and oriented to person, place, and time.  Skin: Skin is warm and dry.  Nursing note and vitals reviewed.    ED Treatments / Results  DIAGNOSTIC STUDIES: Oxygen Saturation is 97% on RA, nl by my interpretation.    COORDINATION OF CARE: 1:53 PM  Discussed treatment plan with pt at bedside which includes left foot xray, post-op shoe, referral and follow up with podiatrist, and pt agreed to plan.   Radiology Dg Foot Complete Left  Result Date: 04/29/2016 CLINICAL DATA:  Right foot pain for 2 weeks. No known injury. Initial encounter. EXAM: LEFT FOOT - COMPLETE 3+ VIEW COMPARISON:  None. FINDINGS: No acute bony or joint abnormality is identified. No evidence of arthropathy is seen. Dorsal and plantar calcaneal spurs are identified. Soft tissues are unremarkable. IMPRESSION: No acute abnormality or finding to explain the patient's symptoms. Calcaneal spurring. Electronically Signed   By: Drusilla Kannerhomas  Dalessio M.D.   On: 04/29/2016 13:37    Procedures Procedures (including critical care time)  Medications Ordered in ED Medications  naproxen (NAPROSYN) tablet 500 mg (500 mg Oral Given 04/29/16 1402)     Initial Impression / Assessment and Plan / ED Course  I have reviewed the triage vital signs and the nursing notes.  Pertinent imaging results that were available during my care of the patient were reviewed by me and considered in my medical decision making (see chart for details).  Clinical Course   Theresa Rios is a 50 y.o. female who presents to ED for left foot pain 2 weeks. No acute injury or increased activity. Left foot with full range of motion and neurovascularly intact. She does exhibit tenderness along the plantar surface of the foot. X-ray obtained which shows a heel spur, but no other acute findings. Post op shoe provided for comfort at request of patient. Podiatry follow up information given. Home care information and return precautions discussed. All questions answered.   Final Clinical Impressions(s) / ED Diagnoses   Final diagnoses:  Plantar fasciitis    New Prescriptions Discharge Medication List as of 04/29/2016  2:12 PM    START taking these medications   Details  naproxen (NAPROSYN) 500 MG tablet Take 1 tablet  (500 mg total) by mouth 2 (two) times daily as needed., Starting Mon 04/29/2016, Print        I personally performed the services described in this documentation, which was scribed in my presence. The recorded information has been reviewed and is accurate.   Tristar Skyline Medical CenterJaime Pilcher Ward, PA-C 04/29/16 1446    Laurence Spatesachel Morgan Little, MD 05/03/16 [phone]

## 2016-04-29 NOTE — ED Triage Notes (Signed)
Pt here with rt foot pain.  No injury noted.  Pain x 2 weeks.

## 2017-08-22 ENCOUNTER — Emergency Department (HOSPITAL_COMMUNITY): Payer: BLUE CROSS/BLUE SHIELD

## 2017-08-22 ENCOUNTER — Other Ambulatory Visit: Payer: Self-pay

## 2017-08-22 ENCOUNTER — Emergency Department (HOSPITAL_COMMUNITY)
Admission: EM | Admit: 2017-08-22 | Discharge: 2017-08-22 | Disposition: A | Discharge status: Home / Self Care | Payer: BLUE CROSS/BLUE SHIELD | Attending: Emergency Medicine | Admitting: Emergency Medicine

## 2017-08-22 ENCOUNTER — Encounter (HOSPITAL_COMMUNITY): Payer: Self-pay

## 2017-08-22 DIAGNOSIS — Z79899 Other long term (current) drug therapy: Secondary | ICD-10-CM | POA: Insufficient documentation

## 2017-08-22 DIAGNOSIS — M19011 Primary osteoarthritis, right shoulder: Secondary | ICD-10-CM | POA: Diagnosis not present

## 2017-08-22 DIAGNOSIS — G8929 Other chronic pain: Secondary | ICD-10-CM

## 2017-08-22 DIAGNOSIS — M25511 Pain in right shoulder: Secondary | ICD-10-CM | POA: Diagnosis present

## 2017-08-22 DIAGNOSIS — F17210 Nicotine dependence, cigarettes, uncomplicated: Secondary | ICD-10-CM | POA: Insufficient documentation

## 2017-08-22 MED ORDER — IBUPROFEN 600 MG PO TABS: 600.0000 mg | ORAL_TABLET | Freq: Three times a day (TID) | ORAL | 0 refills | Status: DC | PRN

## 2017-08-22 MED ORDER — ACETAMINOPHEN 500 MG PO TABS: 500.0000 mg | ORAL_TABLET | Freq: Four times a day (QID) | ORAL | 0 refills | Status: DC | PRN

## 2017-08-22 MED ORDER — IBUPROFEN 200 MG PO TABS
600.0000 mg | ORAL_TABLET | Freq: Once | ORAL | Status: AC
  Administered 2017-08-22: 600 mg via ORAL
  Filled 2017-08-22: qty 3

## 2017-08-22 NOTE — ED Triage Notes (Signed)
Patient states she has wear and tear of right shoulder from her job of 13 years. 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 Patient states she [facility].

## 2017-08-22 NOTE — Discharge Instructions (Signed)
Medications: Ibuprofen, Tylenol  Treatment: Take ibuprofen every 6 hours as needed for your pain.  You can alternate with Tylenol as prescribed in between.  Use ice and heat alternating 20 minutes on, 20 minutes off.  Wear your sling for comfort, but attempt the shoulder range of motion exercises a few times daily.    Follow-up: Please follow-up with the orthopedic doctor for further evaluation and treatment of your symptoms.  Please return to the emergency department if you develop any new or worsening symptoms including fever, redness of your shoulder, complete numbness of your arm, or any other new or concerning symptoms.

## 2017-08-22 NOTE — ED Provider Notes (Signed)
[facility]-EMERGENCY DEPT Provider Note   CSN: [id number] Arrival date & time: 08/22/17  1451     History   Chief Complaint Chief Complaint  Patient presents with  . Shoulder Pain    HPI Theresa Rios is a 51 y.o. female who presents with a 02-5852-month history of right shoulder pain.  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  She is right-handed.  She has been having worsening pain over the past several months with movement of her shoulder.  She has tried ice and an over-the-counter antihistamine without significant relief.  She denies any numbness or tingling.  She denies any pain in the left shoulder or in her hands.  HPI  History reviewed. No pertinent past medical history.  There are no active problems to display for this patient.   History reviewed. No pertinent surgical history.  OB History    No data available       Home Medications    Prior to Admission medications   Medication Sig Start Date End Date Taking? Authorizing Provider  acetaminophen (TYLENOL) 500 MG tablet Take 1 tablet (500 mg total) by mouth every 6 (six) hours as needed. 08/22/17   Merric Yost, Waylan BogaAlexandra M, PA-C  guaiFENesin-codeine 100-10 MG/5ML syrup Take 5-10 mLs by mouth every 6 (six) hours as needed for cough. 11/10/15   Ward, Layla MawKristen N, DO  ibuprofen (ADVIL,MOTRIN) 600 MG tablet Take 1 tablet (600 mg total) by mouth every 8 (eight) hours as needed for mild pain or moderate pain. 08/22/17   Blaike Vickers, Waylan BogaAlexandra M, PA-C  naproxen (NAPROSYN) 500 MG tablet Take 1 tablet (500 mg total) by mouth 2 (two) times daily as needed. 04/29/16   Ward, Chase PicketJaime Pilcher, PA-C  predniSONE (DELTASONE) 20 MG tablet Take 3 tablets (60 mg total) by mouth daily. 11/10/15   Ward, Layla MawKristen N, DO    Family History History reviewed. No pertinent family history.  Social History Social History   Tobacco Use  . Smoking status: Current Every Day  Smoker    Packs/day: 1.00    Types: Cigarettes  . Smokeless tobacco: Never Used  Substance Use Topics  . Alcohol use: Yes    Comment: occasionally  . Drug use: No     Allergies   Patient has no known allergies.   Review of Systems Review of Systems  Constitutional: Negative for fever.  Musculoskeletal: Positive for arthralgias (R shoulder).     Physical Exam Updated Vital Signs BP 120/79 (BP Location: Left Arm)   Pulse (!) 104   Temp 98 F (36.7 C) (Oral)   Resp 18   Ht 5\' 2"  (1.575 m)   Wt 68 kg (150 lb)   SpO2 98%   BMI 27.44 kg/m   Physical Exam  Constitutional: She appears well-developed and well-nourished. No distress.  HENT:  Head: Normocephalic and atraumatic.  Mouth/Throat: Oropharynx is clear and moist. No oropharyngeal exudate.  Eyes: Conjunctivae are normal. Pupils are equal, round, and reactive to light. Right eye exhibits no discharge. Left eye exhibits no discharge. No scleral icterus.  Neck: Normal range of motion. Neck supple. No thyromegaly present.  Cardiovascular: Normal rate, regular rhythm, normal heart sounds and intact distal pulses. Exam reveals no gallop and no friction rub.  No murmur heard. Pulmonary/Chest: Effort normal and breath sounds normal. No stridor. No respiratory distress. She has no wheezes. She has no rales.  Musculoskeletal: She exhibits no edema.  Right shoulder: She exhibits decreased range of motion, tenderness, bony tenderness, spasm (L upper trapezius) and decreased strength. She exhibits normal pulse.  Bony tenderness over the lateral, anterior, posterior right shoulder and scapula; positive empty can test; range of motion intact, however limited due to pain; no warmth or erythema noted to the joint  Lymphadenopathy:    She has no cervical adenopathy.  Neurological: She is alert. Coordination normal.  Skin: Skin is warm and dry. No rash noted. She is not diaphoretic. No pallor.  Psychiatric: She has a normal mood  and affect.  Nursing note and vitals reviewed.    ED Treatments / Results  Labs (all labs ordered are listed, but only abnormal results are displayed) Labs Reviewed - No data to display  EKG  EKG Interpretation None       Radiology Dg Shoulder Right  Result Date: 08/22/2017 CLINICAL DATA:  Progressive right shoulder pain due to "wear and tear" from her job of 13 years. EXAM: RIGHT SHOULDER - 2+ VIEW COMPARISON:  None. FINDINGS: There is no evidence of fracture or dislocation. Mild AC joint osteoarthritis. Minimal degenerate spurring off the greater tuberosity of the humeral head and along the course of the coracoclavicular ligament. The adjacent ribs and lung are nonacute. IMPRESSION: 1. Mild degenerative spurring off the humeral head and coracoid. 2. Mild AC joint osteoarthritis. 3. No acute osseous abnormality. Electronically Signed   By: Tollie Ethavid  Kwon M.D.   On: 08/22/2017 16:33    Procedures Procedures (including critical care time)  Medications Ordered in ED Medications  ibuprofen (ADVIL,MOTRIN) tablet 600 mg (600 mg Oral Given 08/22/17 1634)     Initial Impression / Assessment and Plan / ED Course  I have reviewed the triage vital signs and the nursing notes.  Pertinent labs & imaging results that were available during my care of the patient were reviewed by me and considered in my medical decision making (see chart for details).     Patient with chronic right shoulder pain, worsening over the past 6-7 months.  X-ray shows mild degenerative spurring of the humeral head and coracoid as well as mild AC joint osteoarthritis.  Will treat supportively with sling, range of motion exercises, ibuprofen, Tylenol, ice/heat.  Refer to orthopedics for further evaluation and treatment.  Return precautions discussed.  Patient understands and agrees with plan.  Patient vitals stable throughout ED course and discharged in satisfactory condition.  Final Clinical Impressions(s) / ED  Diagnoses   Final diagnoses:  Osteoarthritis of right shoulder, unspecified osteoarthritis type  Chronic right shoulder pain    ED Discharge Orders        Ordered    ibuprofen (ADVIL,MOTRIN) 600 MG tablet  Every 8 hours PRN     08/22/17 1654    acetaminophen (TYLENOL) 500 MG tablet  Every 6 hours PRN     08/22/17 1654       Awilda Covin, Waylan Bogalexandra M, PA-C 08/22/17 1713    Lorre NickAllen, Anthony, MD 08/23/17 [phone]

## 2018-01-09 ENCOUNTER — Encounter (HOSPITAL_COMMUNITY): Payer: Self-pay

## 2018-01-09 ENCOUNTER — Emergency Department (HOSPITAL_COMMUNITY)
Admission: EM | Admit: 2018-01-09 | Discharge: 2018-01-09 | Disposition: A | Discharge status: Home / Self Care | Payer: BLUE CROSS/BLUE SHIELD | Attending: Emergency Medicine | Admitting: Emergency Medicine

## 2018-01-09 ENCOUNTER — Other Ambulatory Visit: Payer: Self-pay

## 2018-01-09 ENCOUNTER — Emergency Department (HOSPITAL_COMMUNITY): Payer: BLUE CROSS/BLUE SHIELD

## 2018-01-09 DIAGNOSIS — Y93E1 Activity, personal bathing and showering: Secondary | ICD-10-CM | POA: Insufficient documentation

## 2018-01-09 DIAGNOSIS — R0789 Other chest pain: Secondary | ICD-10-CM | POA: Diagnosis present

## 2018-01-09 DIAGNOSIS — R05 Cough: Secondary | ICD-10-CM | POA: Insufficient documentation

## 2018-01-09 DIAGNOSIS — Y999 Unspecified external cause status: Secondary | ICD-10-CM | POA: Insufficient documentation

## 2018-01-09 DIAGNOSIS — R0981 Nasal congestion: Secondary | ICD-10-CM | POA: Insufficient documentation

## 2018-01-09 DIAGNOSIS — F17210 Nicotine dependence, cigarettes, uncomplicated: Secondary | ICD-10-CM | POA: Diagnosis not present

## 2018-01-09 DIAGNOSIS — W1830XA Fall on same level, unspecified, initial encounter: Secondary | ICD-10-CM | POA: Diagnosis not present

## 2018-01-09 DIAGNOSIS — Y92031 Bathroom in apartment as the place of occurrence of the external cause: Secondary | ICD-10-CM | POA: Diagnosis not present

## 2018-01-09 DIAGNOSIS — R0781 Pleurodynia: Secondary | ICD-10-CM

## 2018-01-09 DIAGNOSIS — R059 Cough, unspecified: Secondary | ICD-10-CM

## 2018-01-09 DIAGNOSIS — S20212A Contusion of left front wall of thorax, initial encounter: Secondary | ICD-10-CM

## 2018-01-09 MED ORDER — HYDROCODONE-ACETAMINOPHEN 5-325 MG PO TABS
1.0000 | ORAL_TABLET | Freq: Once | ORAL | Status: AC
  Administered 2018-01-09: 1 via ORAL
  Filled 2018-01-09: qty 1

## 2018-01-09 MED ORDER — NAPROXEN 500 MG PO TABS: 500.0000 mg | ORAL_TABLET | Freq: Two times a day (BID) | ORAL | 0 refills | Status: DC | PRN

## 2018-01-09 MED ORDER — HYDROCODONE-ACETAMINOPHEN 5-325 MG PO TABS: 1.0000 | ORAL_TABLET | Freq: Four times a day (QID) | ORAL | 0 refills | Status: DC | PRN

## 2018-01-09 NOTE — ED Triage Notes (Signed)
Patient reports she slipped and fell in the shower on Wednesday 01/07/18, and hit the left side of her chest on the side of the shower. Patient reports, since then, she has experienced a sharp pain "like something's poking me" to the left side of her rib cage. Patient also reports congestion and coughing up "a lot of phlegm." Patient denies fever.

## 2018-01-09 NOTE — Discharge Instructions (Addendum)
Your xray does not show a rib fracture, however sometimes rib fractures are hard to see on xray. It's very important that you use the incentive spirometer hourly while awake, in order to keep your lungs fully inflated. Also perform a good hard cough every hour after the incentive spirometer use. You can consider using a LOOSELY WRAPPED ace wrap around the area, but do NOT put it on too tight where it would cause you to not be able to take a full deep breath. Alternate between naprosyn and norco as directed, as needed for pain but do not drive or operate machinery with pain medication use. You may consider using ice and/or heat to the areas of pain, no more than 20 minutes every hour for either one. Continue to stay well-hydrated. Use Mucinex for cough suppression/expectoration of mucus. Use netipot and flonase to help with nasal congestion. May consider over-the-counter Benadryl or other antihistamine to decrease secretions and for help with your symptoms. Follow up with your primary care doctor in 5-7 days for recheck of ongoing symptoms. Return to emergency department for emergent changing or worsening of symptoms.

## 2018-01-09 NOTE — ED Notes (Signed)
Pt given IS and ace wrap at this time. Pt observed using IS and was able to met 1250 goal. View 5 practices and pt performed satisfactory.

## 2018-01-09 NOTE — ED Provider Notes (Signed)
[facility] Note   CSN: [id number] Arrival date & time: 01/09/18  2007     History   Chief Complaint Chief Complaint  Patient presents with  . Fall  . Rib Injury    HPI Theresa Rios is a 52 y.o. female with no reported PMHx, who presents to the ED with complaints of mechanical fall that occurred 2 days ago.  Patient states that she was in the shower and slipped on some soap, falling on her left rib cage on the edge of the tub.  She denies head injury or LOC, denies prodromal lightheadedness or other symptoms leading up to the fall.  Since then she has had gradually worsening left-sided rib cage pain which she describes as 10/10 constant sharp nonradiating left rib cage pain, worse with raising her arm, coughing, sneezing, or any movement, and unrelieved with Tylenol.  She also has cough with clearish green sputum production, rhinorrhea, and sneezing.  She denies any hemoptysis, fevers, chills, shortness of breath, wheezing, sore throat, abdominal pain, n/v/d/c, dysuria, hematuria, myalgias elsewhere, other injuries or arthralgias, numbness, tingling, focal weakness, bruises, abrasions, neck or back pain, or any other complaints at this time.  No other injury sustained during the incident.  She is not on any blood thinners.  The history is provided by the patient and medical records. No language interpreter was used.  Fall  Associated symptoms include chest pain (L ribs). Pertinent negatives include no abdominal pain and no shortness of breath.    History reviewed. No pertinent past medical history.  There are no active problems to display for this patient.   History reviewed. No pertinent surgical history.   OB History   None      Home Medications    Prior to Admission medications   Medication Sig Start Date End Date Taking? Authorizing Provider  acetaminophen (TYLENOL) 500 MG tablet Take 1 tablet (500 mg total) by mouth every 6  (six) hours as needed. 08/22/17  Yes Law, Waylan Boga, PA-C  ibuprofen (ADVIL,MOTRIN) 600 MG tablet Take 1 tablet (600 mg total) by mouth every 8 (eight) hours as needed for mild pain or moderate pain. 08/22/17  Yes Law, Waylan Boga, PA-C  guaiFENesin-codeine 100-10 MG/5ML syrup Take 5-10 mLs by mouth every 6 (six) hours as needed for cough. 11/10/15   Ward, Layla Maw, DO  naproxen (NAPROSYN) 500 MG tablet Take 1 tablet (500 mg total) by mouth 2 (two) times daily as needed. 04/29/16   Ward, Chase Picket, PA-C  predniSONE (DELTASONE) 20 MG tablet Take 3 tablets (60 mg total) by mouth daily. 11/10/15   Ward, Layla Maw, DO    Family History History reviewed. No pertinent family history.  Social History Social History   Tobacco Use  . Smoking status: Current Every Day Smoker    Packs/day: 1.00    Types: Cigarettes  . Smokeless tobacco: Never Used  Substance Use Topics  . Alcohol use: Yes    Comment: occasionally  . Drug use: No     Allergies   Patient has no known allergies.   Review of Systems Review of Systems  Constitutional: Negative for chills and fever.  HENT: Positive for rhinorrhea and sneezing. Negative for facial swelling (no head inj) and sore throat.   Respiratory: Positive for cough. Negative for shortness of breath and wheezing.        No hemoptysis  Cardiovascular: Positive for chest pain (L ribs).  Gastrointestinal: Negative for abdominal pain, constipation,  diarrhea, nausea and vomiting.  Genitourinary: Negative for dysuria and hematuria.  Musculoskeletal: Negative for arthralgias, back pain, myalgias and neck pain.  Skin: Negative for color change and wound.  Allergic/Immunologic: Negative for immunocompromised state.  Neurological: Negative for syncope, weakness, light-headedness and numbness.  Hematological: Does not bruise/bleed easily.  Psychiatric/Behavioral: Negative for confusion.   All other systems reviewed and are negative for acute change except as  noted in the HPI.    Physical Exam Updated Vital Signs BP 139/87 (BP Location: Right Arm)   Pulse 100   Temp 98.1 F (36.7 C) (Oral)   Resp 18   Ht  (1.575 m)   Wt 68 kg (150 lb)   SpO2 100%   BMI 27.44 kg/m   Physical Exam  Constitutional: She is oriented to person, place, and time. Vital signs are normal. She appears well-developed and well-nourished.  Non-toxic appearance. No distress.  Afebrile, nontoxic, NAD  HENT:  Head: Normocephalic and atraumatic.  Mouth/Throat: Oropharynx is clear and moist and mucous membranes are normal.  Eyes: Conjunctivae and EOM are normal. Right eye exhibits no discharge. Left eye exhibits no discharge.  Neck: Normal range of motion. Neck supple. No spinous process tenderness and no muscular tenderness present. No neck rigidity. Normal range of motion present.  FROM intact without spinous process TTP, no bony stepoffs or deformities, no paraspinous muscle TTP or muscle spasms. No rigidity or meningeal signs. No bruising or swelling.   Cardiovascular: Normal rate, regular rhythm, normal heart sounds and intact distal pulses. Exam reveals no gallop and no friction rub.  No murmur heard. Pulmonary/Chest: Effort normal and breath sounds normal. No respiratory distress. She has no decreased breath sounds. She has no wheezes. She has no rhonchi. She has no rales. She exhibits tenderness. She exhibits no crepitus, no deformity and no retraction.  Poor inspiratory effort due to chest wall pain but overall CTAB in all lung fields, no w/r/r, no hypoxia or increased WOB, speaking in full sentences, SpO2 100% on RA Chest wall with moderate TTP along left lower rib cage, wrapping around the the posterior ribs on that side, no bruising/abrasions, no crepitus or deformities, no retractions or subQ air.     Abdominal: Soft. Normal appearance and bowel sounds are normal. She exhibits no distension. There is no tenderness. There is no rigidity, no rebound, no  guarding, no CVA tenderness, no tenderness at McBurney's point and negative Murphy's sign.  Musculoskeletal: Normal range of motion.  C-spine as above, all other spinal levels nonTTP without bony stepoffs or deformities  MAE x4 Strength and sensation grossly intact in all extremities Distal pulses intact Gait steady  Neurological: She is alert and oriented to person, place, and time. She has normal strength. No sensory deficit.  Skin: Skin is warm, dry and intact. No rash noted.  Psychiatric: She has a normal mood and affect.  Nursing note and vitals reviewed.    ED Treatments / Results  Labs (all labs ordered are listed, but only abnormal results are displayed) Labs Reviewed - No data to display  EKG EKG Interpretation  Date/Time:  Friday Jan 09 2018 20:29:53 EDT Ventricular Rate:  90 PR Interval:    QRS Duration: 94 QT Interval:  376 QTC Calculation: 461 R Axis:   -13 Text Interpretation:  Sinus rhythm Left ventricular hypertrophy Borderline T abnormalities, diffuse leads since last tracing no significant change Confirmed by Rolan Bucco [id number]) on 01/09/2018 8:59:03 PM   Radiology Dg Chest 2 View  Result  Date: 01/09/2018 CLINICAL DATA:  LEFT lower rib pain EXAM: CHEST - 2 VIEW COMPARISON:  11/09/2015 FINDINGS: Normal cardiac silhouette. There is mild LEFT basilar atelectasis. No displaced rib fractures identified. No pleural fluid. No pneumothorax. IMPRESSION: LEFT lower lobe atelectasis.  No displaced rib fracture identified. Electronically Signed   By: Genevive Bi M.D.   On: 01/09/2018 20:44    Procedures Procedures (including critical care time)  Medications Ordered in ED Medications  HYDROcodone-acetaminophen (NORCO/VICODIN) 5-325 MG per tablet 1 tablet (1 tablet Oral Given 01/09/18 2144)     Initial Impression / Assessment and Plan / ED Course  I have reviewed the triage vital signs and the nursing notes.  Pertinent labs & imaging results that were  available during my care of the patient were reviewed by me and considered in my medical decision making (see chart for details).     52 y.o. female here with mechanical fall 2 days ago complaining of L rib pain and cough. On exam, diffuse L lower rib cage TTP, no crepitus or deformity, no obvious bruising, no retractions. Clear lungs although she doesn't want to fully inspire due to pain. No increased WOB or hypoxia. Exam otherwise benign. CXR showing LLL atelectasis likely from poor inspiratory effort due to pain, no definite rib fx found. EKG nonischemic. Overall, symptoms c/w rib contusion vs occult rib fx. Will give pain meds and incentive spirometer, advised importance of using this, advised use of OTC remedies for symptomatic relief of cough/symptoms. Will give ace wrap to loosely wrap around the area for comfort, but discussed not placing this too tightly. Ice/heat use advised. F/up with PCP in 5-7 days for recheck. I explained the diagnosis and have given explicit precautions to return to the ER including for any other new or worsening symptoms. The patient understands and accepts the medical plan as it's been dictated and I have answered their questions. Discharge instructions concerning home care and prescriptions have been given. The patient is STABLE and is discharged to home in good condition.   [place] reviewed prior to dispensing controlled substance medications, and 2 year search was notable for: none found. Risks/benefits/alternatives and expectations discussed regarding controlled substances. Side effects of medications discussed. Informed consent obtained.    Final Clinical Impressions(s) / ED Diagnoses   Final diagnoses:  Rib pain on left side  Cough  Contusion of rib on left side, initial encounter    ED Discharge Orders        Ordered    naproxen (NAPROSYN) 500 MG tablet  2 times daily PRN     01/09/18 2122    HYDROcodone-acetaminophen (NORCO) 5-325 MG tablet   Every 6 hours PRN     01/09/18 2122       Ahnyla Mendel, [place], [place] 01/09/18 2157    Rolan Bucco, MD 01/09/18 2207

## 2018-05-08 ENCOUNTER — Other Ambulatory Visit: Payer: Self-pay

## 2018-05-08 ENCOUNTER — Encounter (HOSPITAL_COMMUNITY): Payer: Self-pay | Admitting: Emergency Medicine

## 2018-05-08 ENCOUNTER — Emergency Department (HOSPITAL_COMMUNITY)
Admission: EM | Admit: 2018-05-08 | Discharge: 2018-05-09 | Disposition: A | Discharge status: Home / Self Care | Payer: BLUE CROSS/BLUE SHIELD | Attending: Emergency Medicine | Admitting: Emergency Medicine

## 2018-05-08 DIAGNOSIS — J40 Bronchitis, not specified as acute or chronic: Secondary | ICD-10-CM | POA: Diagnosis not present

## 2018-05-08 DIAGNOSIS — F17210 Nicotine dependence, cigarettes, uncomplicated: Secondary | ICD-10-CM | POA: Insufficient documentation

## 2018-05-08 DIAGNOSIS — R05 Cough: Secondary | ICD-10-CM | POA: Diagnosis present

## 2018-05-08 DIAGNOSIS — R51 Headache: Secondary | ICD-10-CM | POA: Diagnosis not present

## 2018-05-08 DIAGNOSIS — Z79899 Other long term (current) drug therapy: Secondary | ICD-10-CM | POA: Diagnosis not present

## 2018-05-08 NOTE — ED Triage Notes (Signed)
C/o productive cough with yellowish-green phlegm, nasal congestion, and headache x 1 week.

## 2018-05-09 ENCOUNTER — Emergency Department (HOSPITAL_COMMUNITY): Payer: BLUE CROSS/BLUE SHIELD

## 2018-05-09 MED ORDER — ACETAMINOPHEN 500 MG PO TABS
1000.0000 mg | ORAL_TABLET | Freq: Once | ORAL | Status: AC
  Administered 2018-05-09: 1000 mg via ORAL

## 2018-05-09 MED ORDER — ALBUTEROL SULFATE HFA 108 (90 BASE) MCG/ACT IN AERS: 1.0000 | INHALATION_SPRAY | Freq: Once | RESPIRATORY_TRACT | Status: DC

## 2018-05-09 MED ORDER — FLUTICASONE PROPIONATE 50 MCG/ACT NA SUSP: 1.0000 | Freq: Every day | NASAL | 0 refills | Status: DC

## 2018-05-09 MED ORDER — BENZONATATE 100 MG PO CAPS: 100.0000 mg | ORAL_CAPSULE | Freq: Three times a day (TID) | ORAL | 0 refills | Status: DC

## 2018-05-09 MED ORDER — PREDNISONE 20 MG PO TABS: 40.0000 mg | ORAL_TABLET | Freq: Every day | ORAL | 0 refills | Status: AC

## 2018-05-09 MED ORDER — IPRATROPIUM-ALBUTEROL 0.5-2.5 (3) MG/3ML IN SOLN
3.0000 mL | Freq: Once | RESPIRATORY_TRACT | Status: AC
  Administered 2018-05-09: 3 mL via RESPIRATORY_TRACT
  Filled 2018-05-09: qty 3

## 2018-05-09 MED ORDER — IBUPROFEN 400 MG PO TABS: 400.0000 mg | ORAL_TABLET | Freq: Four times a day (QID) | ORAL | 0 refills | Status: DC | PRN

## 2018-05-09 NOTE — Discharge Instructions (Signed)
Use Flonase to help with nasal congestion.  This will help her headache.  He can also take Tylenol and ibuprofen for headache.  Take prednisone as directed.  Take albuterol inhaler as directed to help with coughing.  Use Tessalon Perles as directed.  Follow-up with the [facility] to establish primary care.  Return to emergency department for any fever, difficulty breathing, worsening cough or worsening concerning symptoms.

## 2018-05-09 NOTE — ED Provider Notes (Signed)
[facility] Provider Note   CSN: [id number] Arrival date & time: 05/08/18  2327     History   Chief Complaint Chief Complaint  Patient presents with  . Cough  . Headache  . Nasal Congestion    HPI Theresa Rios is a 52 y.o. female who presents for evaluation of 1 week of cough, nasal congestion, sinus pressure, rhinorrhea, and headache.  Patient reports cough is productive of yellow and green sputum.  She does state that when she gets a coughing fit, she feels like she cannot breathe otherwise and difficulty breathing.  She reports that she has also had intermittent headache.  She states she has been trying to take Tylenol and ibuprofen with minimal improvement.  She states that she has not tried any over-the-counter cough medications.  She does report that she is a current smoker and smokes approximately half pack of cigarettes a day.  Patient reports that she does not know if she has any history of COPD or asthma.  Patient denies any fevers, numbness/weakness of her arms or legs, chest pain.   The history is provided by the patient.    History reviewed. No pertinent past medical history.  There are no active problems to display for this patient.   History reviewed. No pertinent surgical history.   OB History   None      Home Medications    Prior to Admission medications   Medication Sig Start Date End Date Taking? Authorizing Provider  acetaminophen (TYLENOL) 500 MG tablet Take 1 tablet (500 mg total) by mouth every 6 (six) hours as needed. 08/22/17   Law, Waylan BogaAlexandra M, PA-C  benzonatate (TESSALON) 100 MG capsule Take 1 capsule (100 mg total) by mouth every 8 (eight) hours. 05/09/18   Maxwell CaulLayden, Lindsey A, PA-C  fluticasone (FLONASE) 50 MCG/ACT nasal spray Place 1 spray into both nostrils daily for 7 days. 05/09/18 05/16/18  Maxwell CaulLayden, Lindsey A, PA-C  guaiFENesin-codeine 100-10 MG/5ML syrup Take 5-10 mLs by mouth every 6 (six) hours as  needed for cough. 11/10/15   Ward, Layla MawKristen N, DO  HYDROcodone-acetaminophen (NORCO) 5-325 MG tablet Take 1 tablet by mouth every 6 (six) hours as needed for severe pain. 01/09/18   Street, OakvaleMercedes, PA-C  ibuprofen (ADVIL,MOTRIN) 400 MG tablet Take 1 tablet (400 mg total) by mouth every 6 (six) hours as needed. 05/09/18   Maxwell CaulLayden, Lindsey A, PA-C  ibuprofen (ADVIL,MOTRIN) 600 MG tablet Take 1 tablet (600 mg total) by mouth every 8 (eight) hours as needed for mild pain or moderate pain. 08/22/17   Law, Waylan BogaAlexandra M, PA-C  naproxen (NAPROSYN) 500 MG tablet Take 1 tablet (500 mg total) by mouth 2 (two) times daily as needed. 04/29/16   Ward, Chase PicketJaime Pilcher, PA-C  naproxen (NAPROSYN) 500 MG tablet Take 1 tablet (500 mg total) by mouth 2 (two) times daily as needed for mild pain, moderate pain or headache (TAKE WITH MEALS.). 01/09/18   Street, Mercedes, PA-C  predniSONE (DELTASONE) 20 MG tablet Take 2 tablets (40 mg total) by mouth daily for 4 days. 05/09/18 05/13/18  Maxwell CaulLayden, Lindsey A, PA-C    Family History No family history on file.  Social History Social History   Tobacco Use  . Smoking status: Current Every Day Smoker    Packs/day: 1.00    Types: Cigarettes  . Smokeless tobacco: Never Used  Substance Use Topics  . Alcohol use: Yes  . Drug use: No     Allergies  Patient has no known allergies.   Review of Systems Review of Systems  Constitutional: Negative for fever.  HENT: Positive for congestion and sinus pressure.   Respiratory: Positive for cough. Negative for shortness of breath.   Cardiovascular: Negative for chest pain.  Gastrointestinal: Negative for abdominal pain, nausea and vomiting.  Neurological: Positive for headaches.  All other systems reviewed and are negative.    Physical Exam Updated Vital Signs BP (!) 141/88 (BP Location: Left Arm)   Pulse 93   Temp 98.9 F (37.2 C) (Oral)   Resp 18   LMP 05/17/2014   SpO2 96%   Physical Exam  Constitutional: She appears  well-developed and well-nourished.  HENT:  Head: Normocephalic and atraumatic.  Nose: Mucosal edema present. Right sinus exhibits maxillary sinus tenderness. Left sinus exhibits maxillary sinus tenderness.  Mouth/Throat: Uvula is midline, oropharynx is clear and moist and mucous membranes are normal.  Erythematous and edematous nasal turbinates bilaterally.  Eyes: Conjunctivae and EOM are normal. Right eye exhibits no discharge. Left eye exhibits no discharge. No scleral icterus.  Cardiovascular: Normal rate and regular rhythm.  Pulmonary/Chest: Effort normal. She has wheezes.  Intermittently coughing.  Able to speak in full sentences without any difficulty.  Faint wheezing noted throughout all lung fields.  Neurological: She is alert.  CN II-XII intact without difficulty.   Skin: Skin is warm and dry.  Psychiatric: She has a normal mood and affect. Her speech is normal and behavior is normal.  Nursing note and vitals reviewed.    ED Treatments / Results  Labs (all labs ordered are listed, but only abnormal results are displayed) Labs Reviewed - No data to display  EKG None  Radiology Dg Chest 2 View  Result Date: 05/09/2018 CLINICAL DATA:  Patient with productive cough EXAM: CHEST - 2 VIEW COMPARISON:  Chest radiograph 01/09/2018 FINDINGS: Stable enlarged cardiac and mediastinal contours. No consolidative pulmonary opacities. No pleural effusion or pneumothorax. Thoracic spine degenerative changes. IMPRESSION: No acute cardiopulmonary process. Electronically Signed   By: Annia Belt M.D.   On: 05/09/2018 01:02    Procedures Procedures (including critical care time)  Medications Ordered in ED Medications  ipratropium-albuterol (DUONEB) 0.5-2.5 (3) MG/3ML nebulizer solution 3 mL (3 mLs Nebulization Given 05/09/18 0017)  acetaminophen (TYLENOL) tablet 1,000 mg (1,000 mg Oral Given 05/09/18 0136)     Initial Impression / Assessment and Plan / ED Course  I have reviewed the  triage vital signs and the nursing notes.  Pertinent labs & imaging results that were available during my care of the patient were reviewed by me and considered in my medical decision making (see chart for details).     52 year old female who presents for evaluation of 1 week of cough, nasal congestion, rhinorrhea, headache.  Was taking over-the-counter ibuprofen and Tylenol with minimal improvement.  Has not tried any other medications.  Does report she is a smoker.  Does not know if she has a history of COPD or asthma. Patient is afebrile, non-toxic appearing, sitting comfortably on examination table. Vital signs reviewed and stable.  On exam, patient is intermittent coughing.  She does have some faint wheezing noted throughout all lung fields.  Patient also with swollen nasal turbinates.  Consider viral URI versus bronchitis versus pneumonia.  Given wheezing, will plan for breathing treatment and chest x-ray.  Chest x-ray reviewed.  No acute infectious etiology.  Reevaluation after breathing treatments.  Patient has some wheezing.  We will plan to treat as bronchitis.  No  indication for antibiotics at this time.  Will give supportive at home therapies.  Encourage primary care follow-up. Patient had ample opportunity for questions and discussion. All patient's questions were answered with full understanding. Strict return precautions discussed. Patient expresses understanding and agreement to plan.   Final Clinical Impressions(s) / ED Diagnoses   Final diagnoses:  Bronchitis    ED Discharge Orders         Ordered    predniSONE (DELTASONE) 20 MG tablet  Daily     05/09/18 0111    fluticasone (FLONASE) 50 MCG/ACT nasal spray  Daily     05/09/18 0111    ibuprofen (ADVIL,MOTRIN) 400 MG tablet  Every 6 hours PRN     05/09/18 0111    benzonatate (TESSALON) 100 MG capsule  Every 8 hours,   Status:  Discontinued     05/09/18 0112    benzonatate (TESSALON) 100 MG capsule  Every 8 hours      05/09/18 0112           Maxwell Caul, PA-C 05/09/18 1558    Geoffery Lyons, MD 05/10/18 [phone]

## 2018-05-09 NOTE — ED Notes (Signed)
Pt returned after discharge requesting tylenol. Pt undischarged, and given tylenol.

## 2018-05-09 NOTE — ED Notes (Signed)
Pt to the desk requesting discharge paperwork. Pt given paperwork, voiced understanding. Albuterol inhaler given for home use, pt refused discharge vitals.

## 2018-05-09 NOTE — ED Notes (Signed)
Patient transported to X-ray 

## 2018-10-05 ENCOUNTER — Encounter (HOSPITAL_COMMUNITY): Payer: Self-pay | Admitting: *Deleted

## 2018-10-05 ENCOUNTER — Emergency Department (HOSPITAL_COMMUNITY): Payer: Self-pay

## 2018-10-05 ENCOUNTER — Emergency Department (HOSPITAL_COMMUNITY)
Admission: EM | Admit: 2018-10-05 | Discharge: 2018-10-05 | Disposition: A | Discharge status: Home / Self Care | Payer: Self-pay | Attending: Emergency Medicine | Admitting: Emergency Medicine

## 2018-10-05 DIAGNOSIS — Y929 Unspecified place or not applicable: Secondary | ICD-10-CM | POA: Insufficient documentation

## 2018-10-05 DIAGNOSIS — Z79899 Other long term (current) drug therapy: Secondary | ICD-10-CM | POA: Insufficient documentation

## 2018-10-05 DIAGNOSIS — S29012A Strain of muscle and tendon of back wall of thorax, initial encounter: Secondary | ICD-10-CM | POA: Insufficient documentation

## 2018-10-05 DIAGNOSIS — Y999 Unspecified external cause status: Secondary | ICD-10-CM | POA: Insufficient documentation

## 2018-10-05 DIAGNOSIS — F17210 Nicotine dependence, cigarettes, uncomplicated: Secondary | ICD-10-CM | POA: Insufficient documentation

## 2018-10-05 DIAGNOSIS — Y9389 Activity, other specified: Secondary | ICD-10-CM | POA: Insufficient documentation

## 2018-10-05 MED ORDER — CYCLOBENZAPRINE HCL 10 MG PO TABS: 10.0000 mg | ORAL_TABLET | Freq: Three times a day (TID) | ORAL | 0 refills | Status: AC

## 2018-10-05 MED ORDER — ACETAMINOPHEN 500 MG PO TABS
1000.0000 mg | ORAL_TABLET | Freq: Once | ORAL | Status: AC
  Administered 2018-10-05: 1000 mg via ORAL
  Filled 2018-10-05: qty 2
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
## 2018-10-07 ENCOUNTER — Telehealth (HOSPITAL_COMMUNITY): Payer: Self-pay

## 2018-10-07 NOTE — Telephone Encounter (Signed)
Left message with patient regarding mammo scholarship application that was received. Left number for her to call back to schedule that appointment.

## 2018-10-14 ENCOUNTER — Telehealth (HOSPITAL_COMMUNITY): Payer: Self-pay

## 2018-10-14 NOTE — Telephone Encounter (Signed)
Left messagel with patient regarding mammo scholarship application that was received. Left number for her to call back to schedule that appointment.

## 2019-03-03 ENCOUNTER — Emergency Department (HOSPITAL_COMMUNITY)
Admission: EM | Admit: 2019-03-03 | Discharge: 2019-03-04 | Discharge status: Against Medical Advice | Payer: Self-pay | Attending: Emergency Medicine | Admitting: Emergency Medicine

## 2019-03-03 ENCOUNTER — Other Ambulatory Visit: Payer: Self-pay

## 2019-03-03 ENCOUNTER — Emergency Department (HOSPITAL_COMMUNITY): Payer: Self-pay

## 2019-03-03 ENCOUNTER — Encounter (HOSPITAL_COMMUNITY): Payer: Self-pay | Admitting: Emergency Medicine

## 2019-03-03 DIAGNOSIS — Z5321 Procedure and treatment not carried out due to patient leaving prior to being seen by health care provider: Secondary | ICD-10-CM | POA: Insufficient documentation

## 2019-03-03 NOTE — ED Triage Notes (Signed)
Pt c/o shortness of breath x 3 weeks and nose bleed x 2 days. No bleeding noted at this time. Hx everyday smoker.

## 2019-10-11 ENCOUNTER — Emergency Department (HOSPITAL_COMMUNITY): Payer: No Typology Code available for payment source

## 2019-10-11 ENCOUNTER — Encounter (HOSPITAL_COMMUNITY): Payer: Self-pay

## 2019-10-11 ENCOUNTER — Emergency Department (HOSPITAL_COMMUNITY)
Admission: EM | Admit: 2019-10-11 | Discharge: 2019-10-12 | Disposition: A | Discharge status: Home / Self Care | Payer: No Typology Code available for payment source | Attending: Emergency Medicine | Admitting: Emergency Medicine

## 2019-10-11 ENCOUNTER — Other Ambulatory Visit: Payer: Self-pay

## 2019-10-11 DIAGNOSIS — E279 Disorder of adrenal gland, unspecified: Secondary | ICD-10-CM | POA: Diagnosis not present

## 2019-10-11 DIAGNOSIS — R079 Chest pain, unspecified: Secondary | ICD-10-CM

## 2019-10-11 DIAGNOSIS — Y92410 Unspecified street and highway as the place of occurrence of the external cause: Secondary | ICD-10-CM | POA: Insufficient documentation

## 2019-10-11 DIAGNOSIS — Y999 Unspecified external cause status: Secondary | ICD-10-CM | POA: Insufficient documentation

## 2019-10-11 DIAGNOSIS — F17210 Nicotine dependence, cigarettes, uncomplicated: Secondary | ICD-10-CM | POA: Diagnosis not present

## 2019-10-11 DIAGNOSIS — Y939 Activity, unspecified: Secondary | ICD-10-CM | POA: Insufficient documentation

## 2019-10-11 DIAGNOSIS — Z79899 Other long term (current) drug therapy: Secondary | ICD-10-CM | POA: Insufficient documentation

## 2019-10-11 DIAGNOSIS — M25572 Pain in left ankle and joints of left foot: Secondary | ICD-10-CM | POA: Diagnosis not present

## 2019-10-11 DIAGNOSIS — S301XXA Contusion of abdominal wall, initial encounter: Secondary | ICD-10-CM | POA: Insufficient documentation

## 2019-10-11 DIAGNOSIS — M25561 Pain in right knee: Secondary | ICD-10-CM | POA: Diagnosis not present

## 2019-10-11 LAB — URINALYSIS, ROUTINE W REFLEX MICROSCOPIC
Bilirubin Urine: NEGATIVE
Glucose, UA: NEGATIVE mg/dL
Hgb urine dipstick: NEGATIVE
Ketones, ur: NEGATIVE mg/dL
Leukocytes,Ua: NEGATIVE
Nitrite: NEGATIVE
Protein, ur: NEGATIVE mg/dL
Specific Gravity, Urine: 1.008 (ref 1.005–1.030)
pH: 5 (ref 5.0–8.0)

## 2019-10-11 LAB — I-STAT BETA HCG BLOOD, ED (MC, WL, AP ONLY): I-stat hCG, quantitative: <5 m[IU]/mL (ref <5)

## 2019-10-11 LAB — CBC WITH DIFFERENTIAL/PLATELET
Abs Immature Granulocytes: 0.05 10*3/uL (ref 0.00–0.07)
Basophils Absolute: 0.1 10*3/uL (ref 0.0–0.1)
Basophils Relative: 1 %
Eosinophils Absolute: 0.1 10*3/uL (ref 0.0–0.5)
Eosinophils Relative: 1 %
HCT: 42 % (ref 36.0–46.0)
Hemoglobin: 13.3 g/dL (ref 12.0–15.0)
Immature Granulocytes: 1 %
Lymphocytes Relative: 20 %
Lymphs Abs: 2.2 10*3/uL (ref 0.7–4.0)
MCH: 29.2 pg (ref 26.0–34.0)
MCHC: 31.7 g/dL (ref 30.0–36.0)
MCV: 92.3 fL (ref 80.0–100.0)
Monocytes Absolute: 0.9 10*3/uL (ref 0.1–1.0)
Monocytes Relative: 8 %
Neutro Abs: 7.6 10*3/uL (ref 1.7–7.7)
Neutrophils Relative %: 69 %
Platelets: 200 10*3/uL (ref 150–400)
RBC: 4.55 MIL/uL (ref 3.87–5.11)
RDW: 13.5 % (ref 11.5–15.5)
WBC: 10.9 10*3/uL — ABNORMAL HIGH (ref 4.0–10.5)
nRBC: 0 % (ref 0.0–0.2)

## 2019-10-11 LAB — BASIC METABOLIC PANEL
Anion gap: 14 (ref 5–15)
BUN: 8 mg/dL (ref 6–20)
CO2: 22 mmol/L (ref 22–32)
Calcium: 9.1 mg/dL (ref 8.9–10.3)
Chloride: 106 mmol/L (ref 98–111)
Creatinine, Ser: 0.6 mg/dL (ref 0.44–1.00)
GFR calc Af Amer: >60 mL/min (ref >60)
GFR calc non Af Amer: >60 mL/min (ref >60)
Glucose, Bld: 79 mg/dL (ref 70–99)
Potassium: 3.8 mmol/L (ref 3.5–5.1)
Sodium: 142 mmol/L (ref 135–145)

## 2019-10-11 LAB — TROPONIN I (HIGH SENSITIVITY): Troponin I (High Sensitivity): 4 ng/L (ref <18)

## 2019-10-11 MED ORDER — MORPHINE SULFATE (PF) 4 MG/ML IV SOLN
4.0000 mg | Freq: Once | INTRAVENOUS | Status: AC
  Administered 2019-10-11: 20:00:00 4 mg via INTRAVENOUS
  Filled 2019-10-11: qty 1

## 2019-10-11 MED ORDER — METHOCARBAMOL 500 MG PO TABS: 500.0000 mg | ORAL_TABLET | Freq: Two times a day (BID) | ORAL | 0 refills | Status: AC

## 2019-10-11 MED ORDER — SODIUM CHLORIDE 0.9 % IV BOLUS
500.0000 mL | Freq: Once | INTRAVENOUS | Status: AC
  Administered 2019-10-11: 20:00:00 500 mL via INTRAVENOUS

## 2019-10-11 MED ORDER — IOHEXOL 300 MG/ML  SOLN
100.0000 mL | Freq: Once | INTRAMUSCULAR | Status: AC | PRN
  Administered 2019-10-11: 100 mL via INTRAVENOUS
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
## 2019-10-12 LAB — TROPONIN I (HIGH SENSITIVITY): Troponin I (High Sensitivity): 4 ng/L (ref <18)

## 2019-10-12 NOTE — ED Notes (Signed)
Discharge instructions discussed with pt. Pt verbalized understanding. Pt stable and ambulating well with cam walker and crutches

## 2019-10-12 NOTE — ED Notes (Signed)
Pt ambulating in to bathroom with one assist. C/o lower abd pain

## 2020-02-02 ENCOUNTER — Ambulatory Visit: Payer: Self-pay | Attending: Internal Medicine

## 2020-02-02 DIAGNOSIS — Z23 Encounter for immunization: Secondary | ICD-10-CM

## 2020-02-02 NOTE — Progress Notes (Signed)
   Covid-19 Vaccination Clinic  Name:  Theresa Rios    MRN: [id number] DOB: 29-Aug-1966  02/02/2020  [name] was observed post Covid-19 immunization for 15 minutes without incident. She was provided with Vaccine Information Sheet and instruction to access the V-Safe system.   [name] was instructed to call 911 with any severe reactions post vaccine: Marland Kitchen Difficulty breathing  . Swelling of face and throat  . A fast heartbeat  . A bad rash all over body  . Dizziness and weakness   Immunizations Administered    Name Date Dose VIS Date Route   Pfizer COVID-19 Vaccine 02/02/2020  9:34 AM 0.3 mL 11/03/2018 Intramuscular   Manufacturer: [employer], [employer]   Lot: [id number]   NDC: [address]-8

## 2020-02-28 ENCOUNTER — Ambulatory Visit: Payer: Self-pay | Attending: Internal Medicine

## 2020-02-28 ENCOUNTER — Ambulatory Visit: Payer: Self-pay

## 2020-02-28 DIAGNOSIS — Z23 Encounter for immunization: Secondary | ICD-10-CM

## 2020-02-28 NOTE — Progress Notes (Signed)
° °  Covid-19 Vaccination Clinic  Name:  ELGA SANTY    MRN: [id number] DOB: 1966-06-20  02/28/2020  Ms. Borunda was observed post Covid-19 immunization for 15 minutes without incident. She was provided with Vaccine Information Sheet and instruction to access the V-Safe system.   Ms. Oyer was instructed to call 911 with any severe reactions post vaccine:  Difficulty breathing   Swelling of face and throat   A fast heartbeat   A bad rash all over body   Dizziness and weakness   Immunizations Administered    Name Date Dose VIS Date Route   Pfizer COVID-19 Vaccine 02/28/2020 11:50 AM 0.3 mL 11/03/2018 Intramuscular   Manufacturer: [employer], [employer]   Lot: [id number]   NDC: [address]-2

## 2020-11-03 ENCOUNTER — Emergency Department (HOSPITAL_COMMUNITY)
Admission: EM | Admit: 2020-11-03 | Discharge: 2020-11-03 | Disposition: A | Discharge status: Home / Self Care | Payer: Self-pay | Attending: Emergency Medicine | Admitting: Emergency Medicine

## 2020-11-03 ENCOUNTER — Emergency Department (HOSPITAL_COMMUNITY): Payer: Self-pay

## 2020-11-03 ENCOUNTER — Other Ambulatory Visit: Payer: Self-pay

## 2020-11-03 DIAGNOSIS — R42 Dizziness and giddiness: Secondary | ICD-10-CM | POA: Insufficient documentation

## 2020-11-03 DIAGNOSIS — R519 Headache, unspecified: Secondary | ICD-10-CM | POA: Insufficient documentation

## 2020-11-03 DIAGNOSIS — M545 Low back pain, unspecified: Secondary | ICD-10-CM | POA: Insufficient documentation

## 2020-11-03 DIAGNOSIS — W228XXA Striking against or struck by other objects, initial encounter: Secondary | ICD-10-CM | POA: Insufficient documentation

## 2020-11-03 DIAGNOSIS — M549 Dorsalgia, unspecified: Secondary | ICD-10-CM

## 2020-11-03 DIAGNOSIS — F17210 Nicotine dependence, cigarettes, uncomplicated: Secondary | ICD-10-CM | POA: Insufficient documentation

## 2020-11-03 DIAGNOSIS — Y990 Civilian activity done for income or pay: Secondary | ICD-10-CM | POA: Insufficient documentation

## 2020-11-03 LAB — CBC WITH DIFFERENTIAL/PLATELET
Abs Immature Granulocytes: 0.02 10*3/uL (ref 0.00–0.07)
Basophils Absolute: 0 10*3/uL (ref 0.0–0.1)
Basophils Relative: 0 %
Eosinophils Absolute: 0.1 10*3/uL (ref 0.0–0.5)
Eosinophils Relative: 1 %
HCT: 46.4 % — ABNORMAL HIGH (ref 36.0–46.0)
Hemoglobin: 14.4 g/dL (ref 12.0–15.0)
Immature Granulocytes: 0 %
Lymphocytes Relative: 29 %
Lymphs Abs: 2.4 10*3/uL (ref 0.7–4.0)
MCH: 28.5 pg (ref 26.0–34.0)
MCHC: 31 g/dL (ref 30.0–36.0)
MCV: 91.7 fL (ref 80.0–100.0)
Monocytes Absolute: 0.9 10*3/uL (ref 0.1–1.0)
Monocytes Relative: 10 %
Neutro Abs: 4.8 10*3/uL (ref 1.7–7.7)
Neutrophils Relative %: 60 %
Platelets: 267 10*3/uL (ref 150–400)
RBC: 5.06 MIL/uL (ref 3.87–5.11)
RDW: 13.3 % (ref 11.5–15.5)
WBC: 8.3 10*3/uL (ref 4.0–10.5)
nRBC: 0 % (ref 0.0–0.2)

## 2020-11-03 LAB — BASIC METABOLIC PANEL
Anion gap: 13 (ref 5–15)
BUN: <5 mg/dL — ABNORMAL LOW (ref 6–20)
CO2: 22 mmol/L (ref 22–32)
Calcium: 9.5 mg/dL (ref 8.9–10.3)
Chloride: 103 mmol/L (ref 98–111)
Creatinine, Ser: 0.66 mg/dL (ref 0.44–1.00)
GFR, Estimated: >60 mL/min (ref >60)
Glucose, Bld: 124 mg/dL — ABNORMAL HIGH (ref 70–99)
Potassium: 3.7 mmol/L (ref 3.5–5.1)
Sodium: 138 mmol/L (ref 135–145)

## 2020-11-03 MED ORDER — METHOCARBAMOL 500 MG PO TABS: 500.0000 mg | ORAL_TABLET | Freq: Two times a day (BID) | ORAL | 0 refills | Status: DC

## 2020-11-03 MED ORDER — ACETAMINOPHEN 500 MG PO TABS
1000.0000 mg | ORAL_TABLET | Freq: Once | ORAL | Status: AC
  Administered 2020-11-03: 1000 mg via ORAL
  Filled 2020-11-03: qty 2

## 2020-11-03 MED ORDER — LIDOCAINE 5 % EX PTCH
2.0000 | MEDICATED_PATCH | Freq: Once | CUTANEOUS | Status: DC
  Administered 2020-11-03: 2 via TRANSDERMAL
  Filled 2020-11-03: qty 2

## 2020-11-03 MED ORDER — METHOCARBAMOL 500 MG PO TABS
500.0000 mg | ORAL_TABLET | Freq: Once | ORAL | Status: AC
  Administered 2020-11-03: 500 mg via ORAL
  Filled 2020-11-03: qty 1

## 2020-11-03 NOTE — ED Provider Notes (Signed)
[facility] EMERGENCY DEPARTMENT Provider Note   CSN: [id number] Arrival date & time: 11/03/20  1109     History Chief Complaint  Patient presents with  . Back Pain  . Dizziness    SAMIYAH Rios is a 55 y.o. female.  Theresa Rios is a 55 y.o. female is otherwise healthy, does not routinely, presents reporting facial pain and back pain she had boxes fall on her at work several months ago causing her to fall forward striking her face.  She is not sure exactly how many months it has been, initially said August but then thought it had been more recent than that.  She has not been seen or evaluated since this incident occurred at work.  Patient reports she had struck her in the back causing her to move fall forward hitting her face on a rail, she did not lose consciousness, but was worried she may have broken a bone in her face.  Reports initially after the incident she had swelling in her face.  She reports ever since this incident she has been having pain in her mid and upper back that seems to come and go with is worse with movement.  She denies any associated abdominal exam and tingling or weakness in her extremities.  She has been able to walk around.  Patient also reports that since that she has intermittently been feeling dizzy.  She primarily reports that she feels this when she goes to first get up in the morning and if she looks up for prolonged period of time but otherwise does not have dizziness and is currently not dizzy.  She also reports that for several months she has had some blurry vision in her left eye is unsure if this was present before having the incident at work.  Denies any headaches.  Denies chest pain, shortness of breath, abdominal pain, nausea or vomiting.  Patient also mentions that she thinks she may have had a seizure about a month ago, is unsure exactly how long ago this was, states that she called EMS but they told her she was okay and she was not  transported to the hospital, no prior history, is unable to provide further details but reports no similar episodes since then.        No past medical history on file.  There are no problems to display for this patient.   No past surgical history on file.   OB History   No obstetric history on file.     No family history on file.  Social History   Tobacco Use  . Smoking status: Current Every Day Smoker    Packs/day: 1.00    Types: Cigarettes  . Smokeless tobacco: Never Used  Vaping Use  . Vaping Use: Never used  Substance Use Topics  . Alcohol use: Yes  . Drug use: No    Home Medications Prior to Admission medications   Medication Sig Start Date End Date Taking? Authorizing Provider  methocarbamol (ROBAXIN) 500 MG tablet Take 1 tablet (500 mg total) by mouth 2 (two) times daily. 11/03/20  Yes Dartha Lodge, PA-C  acetaminophen (TYLENOL) 500 MG tablet Take 1 tablet (500 mg total) by mouth every 6 (six) hours as needed. 08/22/17   Law, Waylan Boga, PA-C  benzonatate (TESSALON) 100 MG capsule Take 1 capsule (100 mg total) by mouth every 8 (eight) hours. 05/09/18   Maxwell Caul, PA-C  fluticasone (FLONASE) 50 MCG/ACT nasal spray Place  1 spray into both nostrils daily for 7 days. 05/09/18 05/16/18  Maxwell CaulLayden, Lindsey A, PA-C  guaiFENesin-codeine 100-10 MG/5ML syrup Take 5-10 mLs by mouth every 6 (six) hours as needed for cough. 11/10/15   Ward, Layla MawKristen N, DO  HYDROcodone-acetaminophen (NORCO) 5-325 MG tablet Take 1 tablet by mouth every 6 (six) hours as needed for severe pain. 01/09/18   Street, PoulanMercedes, PA-C  ibuprofen (ADVIL,MOTRIN) 400 MG tablet Take 1 tablet (400 mg total) by mouth every 6 (six) hours as needed. 05/09/18   Maxwell CaulLayden, Lindsey A, PA-C  ibuprofen (ADVIL,MOTRIN) 600 MG tablet Take 1 tablet (600 mg total) by mouth every 8 (eight) hours as needed for mild pain or moderate pain. 08/22/17   Law, Waylan BogaAlexandra M, PA-C  naproxen (NAPROSYN) 500 MG tablet Take 1 tablet (500 mg  total) by mouth 2 (two) times daily as needed. 04/29/16   Ward, Chase PicketJaime Pilcher, PA-C  naproxen (NAPROSYN) 500 MG tablet Take 1 tablet (500 mg total) by mouth 2 (two) times daily as needed for mild pain, moderate pain or headache (TAKE WITH MEALS.). 01/09/18   Street, FairplayMercedes, PA-C    Allergies    Patient has no known allergies.  Review of Systems   Review of Systems  Constitutional: Negative for chills and fever.  HENT: Negative for congestion, facial swelling and rhinorrhea.   Eyes: Negative for pain and visual disturbance.  Respiratory: Negative for shortness of breath.   Cardiovascular: Negative for chest pain.  Gastrointestinal: Negative for abdominal pain, nausea and vomiting.  Genitourinary: Negative for dysuria and frequency.  Musculoskeletal: Positive for back pain.  Skin: Negative for color change and rash.  Neurological: Positive for dizziness. Negative for syncope, weakness, numbness and headaches.  All other systems reviewed and are negative.   Physical Exam Updated Vital Signs BP 130/70 (BP Location: Right Arm)   Pulse 94   Temp 98.3 F (36.8 C) (Oral)   Resp 20   LMP 05/17/2014   Physical Exam Vitals and nursing note reviewed.  Constitutional:      General: She is not in acute distress.    Appearance: Normal appearance. She is well-developed, normal weight and well-nourished. She is not ill-appearing or diaphoretic.  HENT:     Head: Normocephalic and atraumatic.     Comments: No evidence of head trauma, no step-off, hematoma or deformity and no significant facial tenderness or swelling.    Mouth/Throat:     Mouth: Oropharynx is clear and moist. Mucous membranes are moist.     Pharynx: Oropharynx is clear.  Eyes:     General:        Right eye: No discharge.        Left eye: No discharge.     Extraocular Movements: Extraocular movements intact and EOM normal.     Pupils: Pupils are equal, round, and reactive to light.  Neck:     Comments: Mild tenderness over  the midline C-spine with no step-off or deformity Cardiovascular:     Rate and Rhythm: Normal rate and regular rhythm.     Pulses: Intact distal pulses.     Heart sounds: Normal heart sounds. No murmur heard. No friction rub. No gallop.   Pulmonary:     Effort: Pulmonary effort is normal. No respiratory distress.     Breath sounds: Normal breath sounds. No wheezing or rales.     Comments: Respirations equal and unlabored, patient able to speak in full sentences, lungs clear to auscultation bilaterally  Abdominal:  General: Bowel sounds are normal. There is no distension.     Palpations: Abdomen is soft. There is no mass.     Tenderness: There is no abdominal tenderness. There is no guarding.     Comments: Abdomen soft, nondistended, nontender to palpation in all quadrants without guarding or peritoneal signs   Musculoskeletal:        General: Tenderness present. No deformity or edema.     Cervical back: Neck supple.     Comments: Tenderness noted throughout the thoracic and lumbar spine both midline and paraspinally without palpable deformity, step-off, patient has increased pain with movement, but is able to lift lower legs without difficulty.  Skin:    General: Skin is warm and dry.     Capillary Refill: Capillary refill takes less than 2 seconds.  Neurological:     Mental Status: She is alert.     Coordination: Coordination normal.     Comments: Speech is clear, able to follow commands CN III-XII intact Normal strength in upper and lower extremities bilaterally including dorsiflexion and plantar flexion, strong and equal grip strength Sensation normal to light and sharp touch Moves extremities without ataxia, coordination intact  Psychiatric:        Mood and Affect: Mood normal.        Behavior: Behavior normal.     ED Results / Procedures / Treatments   Labs (all labs ordered are listed, but only abnormal results are displayed) Labs Reviewed  BASIC METABOLIC PANEL -  Abnormal; Notable for the following components:      Result Value   Glucose, Bld 124 (*)    BUN <5 (*)    All other components within normal limits  CBC WITH DIFFERENTIAL/PLATELET - Abnormal; Notable for the following components:   HCT 46.4 (*)    All other components within normal limits    EKG None  Radiology DG Thoracic Spine 2 View  Result Date: 11/03/2020 CLINICAL DATA:  Back pain, left greater than right. No reported injury. EXAM: THORACIC SPINE 2 VIEWS COMPARISON:  10/05/2018 thoracic spine radiograph FINDINGS: Thoracic vertebral body heights are preserved, with no fracture or subluxation. No suspicious focal osseous lesions. 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 Moderate degenerative disc disease throughout the visualized cervical spine. IMPRESSION: 1. No thoracic spine fracture or subluxation. 2. 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 Electronically Signed   By: Delbert Phenix M.D.   On: 11/03/2020 12:36   DG Lumbar Spine Complete  Result Date: 11/03/2020 CLINICAL DATA:  Back pain beginning at work, left greater than right. No reported injury. EXAM: LUMBAR SPINE - COMPLETE 4+ VIEW COMPARISON:  10/11/2019 CT abdomen/pelvis. FINDINGS: This report assumes 5 non rib-bearing lumbar vertebrae. Lumbar vertebral body heights are preserved, with no fracture. Mild multilevel lumbar degenerative disc disease, most prominent at L4-5 with minimal spondylosis. No spondylolisthesis. No significant facet arthropathy. No aggressive appearing focal osseous lesions. Minimal abdominal aortic atherosclerosis. IMPRESSION: Mild multilevel lumbar degenerative disc disease, most prominent at L4-5. No acute osseous abnormality in the lumbar spine. Electronically Signed   By: Delbert Phenix M.D.   On: 11/03/2020 12:40   CT Head Wo Contrast  Result Date: 11/03/2020 CLINICAL DATA:  Intermittent dizziness since facial trauma a few months ago. EXAM: CT HEAD WITHOUT  CONTRAST CT MAXILLOFACIAL WITHOUT CONTRAST CT CERVICAL SPINE WITHOUT CONTRAST TECHNIQUE: Multidetector CT imaging of the head, cervical spine, and maxillofacial structures were performed using the standard protocol without intravenous contrast. Multiplanar CT image  reconstructions of the cervical spine and maxillofacial structures were also generated. COMPARISON:  CT head and cervical spine dated October 11, 2019. FINDINGS: CT HEAD FINDINGS Brain: No evidence of acute infarction, hemorrhage, hydrocephalus, extra-axial collection or mass lesion/mass effect. Vascular: Atherosclerotic vascular calcification of the carotid siphons. No hyperdense vessel. Skull: Normal. Negative for fracture or focal lesion. Other: None. CT MAXILLOFACIAL FINDINGS Osseous: No acute fracture. Old deformities of the right lamina papyracea and left zygomatic arch, unchanged since February 2021. Orbits: Negative. No acute traumatic or inflammatory finding. Unchanged medial intraorbital fat and slight medial rectus muscle herniation into the lamina papyracea defect. Sinuses: Chronic right maxillary sinus mucosal thickening with small air-fluid level, not significantly changed. Remaining paranasal sinuses and mastoid air cells are clear. Soft tissues: None. CT CERVICAL SPINE FINDINGS Alignment: Reversal of the normal cervical lordosis. No traumatic malalignment. Skull base and vertebrae: No acute fracture. No primary bone lesion or focal pathologic process. Soft tissues and spinal canal: No prevertebral fluid or swelling. No visible canal hematoma. Disc levels: Similar moderate disc height loss with small posterior disc osteophyte complexes and moderate uncovertebral hypertrophy from C4-C5 through C6-C7. Upper chest: Negative. Other: None. IMPRESSION: 1. No acute intracranial abnormality. 2. No acute maxillofacial fracture. Old deformities of the right lamina papyracea and left zygomatic arch, unchanged since February 2021. 3. No acute cervical  spine fracture or traumatic listhesis. Electronically Signed   By: Obie Dredge M.D.   On: 11/03/2020 13:42   CT Cervical Spine Wo Contrast  Result Date: 11/03/2020 CLINICAL DATA:  Intermittent dizziness since facial trauma a few months ago. EXAM: CT HEAD WITHOUT CONTRAST CT MAXILLOFACIAL WITHOUT CONTRAST CT CERVICAL SPINE WITHOUT CONTRAST TECHNIQUE: Multidetector CT imaging of the head, cervical spine, and maxillofacial structures were performed using the standard protocol without intravenous contrast. Multiplanar CT image reconstructions of the cervical spine and maxillofacial structures were also generated. COMPARISON:  CT head and cervical spine dated October 11, 2019. FINDINGS: CT HEAD FINDINGS Brain: No evidence of acute infarction, hemorrhage, hydrocephalus, extra-axial collection or mass lesion/mass effect. Vascular: Atherosclerotic vascular calcification of the carotid siphons. No hyperdense vessel. Skull: Normal. Negative for fracture or focal lesion. Other: None. CT MAXILLOFACIAL FINDINGS Osseous: No acute fracture. Old deformities of the right lamina papyracea and left zygomatic arch, unchanged since February 2021. Orbits: Negative. No acute traumatic or inflammatory finding. Unchanged medial intraorbital fat and slight medial rectus muscle herniation into the lamina papyracea defect. Sinuses: Chronic right maxillary sinus mucosal thickening with small air-fluid level, not significantly changed. Remaining paranasal sinuses and mastoid air cells are clear. Soft tissues: None. CT CERVICAL SPINE FINDINGS Alignment: Reversal of the normal cervical lordosis. No traumatic malalignment. Skull base and vertebrae: No acute fracture. No primary bone lesion or focal pathologic process. Soft tissues and spinal canal: No prevertebral fluid or swelling. No visible canal hematoma. Disc levels: Similar moderate disc height loss with small posterior disc osteophyte complexes and moderate uncovertebral hypertrophy  from C4-C5 through C6-C7. Upper chest: Negative. Other: None. IMPRESSION: 1. No acute intracranial abnormality. 2. No acute maxillofacial fracture. Old deformities of the right lamina papyracea and left zygomatic arch, unchanged since February 2021. 3. No acute cervical spine fracture or traumatic listhesis. Electronically Signed   By: Obie Dredge M.D.   On: 11/03/2020 13:42   CT Maxillofacial WO CM  Result Date: 11/03/2020 CLINICAL DATA:  Intermittent dizziness since facial trauma a few months ago. EXAM: CT HEAD WITHOUT CONTRAST CT MAXILLOFACIAL WITHOUT CONTRAST CT CERVICAL SPINE WITHOUT CONTRAST TECHNIQUE:  Multidetector CT imaging of the head, cervical spine, and maxillofacial structures were performed using the standard protocol without intravenous contrast. Multiplanar CT image reconstructions of the cervical spine and maxillofacial structures were also generated. COMPARISON:  CT head and cervical spine dated October 11, 2019. FINDINGS: CT HEAD FINDINGS Brain: No evidence of acute infarction, hemorrhage, hydrocephalus, extra-axial collection or mass lesion/mass effect. Vascular: Atherosclerotic vascular calcification of the carotid siphons. No hyperdense vessel. Skull: Normal. Negative for fracture or focal lesion. Other: None. CT MAXILLOFACIAL FINDINGS Osseous: No acute fracture. Old deformities of the right lamina papyracea and left zygomatic arch, unchanged since February 2021. Orbits: Negative. No acute traumatic or inflammatory finding. Unchanged medial intraorbital fat and slight medial rectus muscle herniation into the lamina papyracea defect. Sinuses: Chronic right maxillary sinus mucosal thickening with small air-fluid level, not significantly changed. Remaining paranasal sinuses and mastoid air cells are clear. Soft tissues: None. CT CERVICAL SPINE FINDINGS Alignment: Reversal of the normal cervical lordosis. No traumatic malalignment. Skull base and vertebrae: No acute fracture. No primary  bone lesion or focal pathologic process. Soft tissues and spinal canal: No prevertebral fluid or swelling. No visible canal hematoma. Disc levels: Similar moderate disc height loss with small posterior disc osteophyte complexes and moderate uncovertebral hypertrophy from C4-C5 through C6-C7. Upper chest: Negative. Other: None. IMPRESSION: 1. No acute intracranial abnormality. 2. No acute maxillofacial fracture. Old deformities of the right lamina papyracea and left zygomatic arch, unchanged since February 2021. 3. No acute cervical spine fracture or traumatic listhesis. Electronically Signed   By: Obie Dredge M.D.   On: 11/03/2020 13:42    Procedures Procedures   Medications Ordered in ED Medications  lidocaine (LIDODERM) 5 % 2 patch (2 patches Transdermal Patch Applied 11/03/20 1224)  acetaminophen (TYLENOL) tablet 1,000 mg (1,000 mg Oral Given 11/03/20 1224)  methocarbamol (ROBAXIN) tablet 500 mg (500 mg Oral Given 11/03/20 1224)    ED Course  I have reviewed the triage vital signs and the nursing notes.  Pertinent labs & imaging results that were available during my care of the patient were reviewed by me and considered in my medical decision making (see chart for details).    MDM Rules/Calculators/A&P                         55 year old female presents for evaluation of left-sided facial pain and back pain after an injury at work that occurred several months ago, the exact timeline is unclear, patient has never sought evaluation for this until today.  She states she just thought that symptoms would go away.  She has had mid to low back pain persistently that waxes and wanes in intensity.  Also reports striking the left cheek and arm: Concerned she could have a broken bone there.  I explained to the patient that even if she did is been several months and this is likely that she is very concerned about this.  Has been having intermittent episodes of dizziness, typically when she gets up in  the morning but not persistent throughout the day also reports some blurred vision in the left eye which she is not sure how long this has been going on and if it was present prior to the injury.  Also adds that she thinks she may have had a seizure about a month ago, unable to provide further details but states that EMS came out and checked her out, told her she was okay so she did not come  to the hospital.  Overall today her primary complaint seems to be pain in her mid and lower back, she has no focal neurologic deficits.  We will get CTs of the head, maxillofacial bones and neck as well as x-rays of the thoracic and lumbar spine.  Will check basic labs.  Will give symptomatic control for back pain.  I have independently ordered, reviewed and interpreted all labs and imaging: CBC: No leukocytosis, normal hemoglobin BMP: Glucose 124, no other electrolyte derangements, normal renal function  CTs of the head, maxillofacial bones and C-spine overall reassuring, no acute intracranial abnormality, no acute facial fracture, old deformities of the right lamina and left zygomatic arch which are unchanged when compared to imaging on February 2021.  No acute C-spine fracture or traumatic malalignment.  Plain films of the thoracic and lumbar spine show multilevel degenerative changes but no acute injury.  I discussed these reassuring results with the patient, after treatment of her pain here in the ED she has improved mobility.  Stressed the importance of following up with her primary care provider regarding the symptoms that have been ongoing for several months.  At this time there does not appear to be any evidence of an acute emergency medical condition and the patient appears stable for discharge with appropriate outpatient follow up.Diagnosis was discussed with patient who verbalizes understanding and is agreeable to discharge.   Final Clinical Impression(s) / ED Diagnoses Final diagnoses:  Back pain due  to injury  Facial pain  Dizziness    Rx / DC Orders ED Discharge Orders         Ordered    methocarbamol (ROBAXIN) 500 MG tablet  2 times daily        11/03/20 1429           Legrand Rams 11/03/20 1538    Pricilla Loveless, MD 11/04/20 [phone]

## 2020-11-03 NOTE — ED Notes (Signed)
PT transported to CT>

## 2020-11-03 NOTE — ED Triage Notes (Addendum)
Pt states a few months ago she was at work when some boxes fell on her back which then made her hit her face on a rail... States she thinks she broke a bone in her face. States she has dizziness on and off from that. Pt states she had a sz earlier this month.

## 2020-11-03 NOTE — Discharge Instructions (Addendum)
Your imaging and lab work today has been reassuring.  I see no evidence of broken bones from your fall several months ago.  It is important that you follow-up with your regular doctor regarding the symptoms that have been ongoing for several months now.  You can use ibuprofen, Tylenol and prescribed Robaxin to help with back pain you can also use over-the-counter Salonpas lidocaine patches.  Avoid using alcohol because this can contribute to dizziness.  Make sure you are getting up slowly in the morning.  If dizziness becomes more persistent or you develop other new or concerning symptoms please return for reevaluation.

## 2021-12-30 ENCOUNTER — Emergency Department (HOSPITAL_COMMUNITY)
Admission: EM | Admit: 2021-12-30 | Discharge: 2021-12-31 | Disposition: A | Discharge status: Home / Self Care | Payer: Self-pay | Attending: Emergency Medicine | Admitting: Emergency Medicine

## 2021-12-30 ENCOUNTER — Emergency Department (HOSPITAL_COMMUNITY): Payer: Self-pay

## 2021-12-30 ENCOUNTER — Encounter (HOSPITAL_COMMUNITY): Payer: Self-pay

## 2021-12-30 ENCOUNTER — Other Ambulatory Visit: Payer: Self-pay

## 2021-12-30 DIAGNOSIS — R569 Unspecified convulsions: Secondary | ICD-10-CM | POA: Insufficient documentation

## 2021-12-30 LAB — CBC WITH DIFFERENTIAL/PLATELET
Abs Immature Granulocytes: 0.04 K/uL (ref 0.00–0.07)
Basophils Absolute: 0 K/uL (ref 0.0–0.1)
Basophils Relative: 0 %
Eosinophils Absolute: 0.1 K/uL (ref 0.0–0.5)
Eosinophils Relative: 1 %
HCT: 44 % (ref 36.0–46.0)
Hemoglobin: 14.3 g/dL (ref 12.0–15.0)
Immature Granulocytes: 0 %
Lymphocytes Relative: 19 %
Lymphs Abs: 1.8 K/uL (ref 0.7–4.0)
MCH: 28.7 pg (ref 26.0–34.0)
MCHC: 32.5 g/dL (ref 30.0–36.0)
MCV: 88.2 fL (ref 80.0–100.0)
Monocytes Absolute: 0.7 K/uL (ref 0.1–1.0)
Monocytes Relative: 8 %
Neutro Abs: 6.5 K/uL (ref 1.7–7.7)
Neutrophils Relative %: 72 %
Platelets: 248 K/uL (ref 150–400)
RBC: 4.99 MIL/uL (ref 3.87–5.11)
RDW: 13.9 % (ref 11.5–15.5)
WBC: 9.1 K/uL (ref 4.0–10.5)
nRBC: 0 % (ref 0.0–0.2)

## 2021-12-30 LAB — ETHANOL: Alcohol, Ethyl (B): <10 mg/dL (ref <10)

## 2021-12-30 NOTE — ED Provider Notes (Signed)
?[facility] DEPT ?Provider Note ? ? ?CSN: [id number] ?Arrival date & time: 12/30/21  2210 ? ?  ? ?History ? ?Chief Complaint  ?Patient presents with  ? Seizures  ? ? ?Theresa Rios is a 56 y.o. female. ? ?The history is provided by the patient and medical records.  ?Seizures ? ?56 year old female with history of seasonal allergies, presenting to the ED with new onset seizure.  Seizure occurred at home witnessed by husband approximately 45 minutes ago.  Unclear how long this lasted, but self resolved.  Patient states last thing she remembers was lying down to go to sleep.  She denies any new medications, no substance abuse, no history of seizures.  She denies any pain or injuries.  Did have a head injury at work in October 2022, reports some issues since that time. ? ?Home Medications ?Prior to Admission medications   ?Medication Sig Start Date End Date Taking? Authorizing Provider  ?acetaminophen (TYLENOL) 500 MG tablet Take 1 tablet (500 mg total) by mouth every 6 (six) hours as needed. 08/22/17   Frederica Kuster, PA-C  ?benzonatate (TESSALON) 100 MG capsule Take 1 capsule (100 mg total) by mouth every 8 (eight) hours. 05/09/18   Volanda Napoleon, PA-C  ?fluticasone (FLONASE) 50 MCG/ACT nasal spray Place 1 spray into both nostrils daily for 7 days. 05/09/18 05/16/18  [place]idence Lanius A, PA-C  ?guaiFENesin-codeine 100-10 MG/5ML syrup Take 5-10 mLs by mouth every 6 (six) hours as needed for cough. 11/10/15   Ward, Delice Bison, DO  ?HYDROcodone-acetaminophen (NORCO) 5-325 MG tablet Take 1 tablet by mouth every 6 (six) hours as needed for severe pain. 01/09/18   Street, [place], PA-C  ?ibuprofen (ADVIL,MOTRIN) 400 MG tablet Take 1 tablet (400 mg total) by mouth every 6 (six) hours as needed. 05/09/18   Volanda Napoleon, PA-C  ?ibuprofen (ADVIL,MOTRIN) 600 MG tablet Take 1 tablet (600 mg total) by mouth every 8 (eight) hours as needed for mild pain or moderate pain. 08/22/17   Frederica Kuster,  PA-C  ?methocarbamol (ROBAXIN) 500 MG tablet Take 1 tablet (500 mg total) by mouth 2 (two) times daily. 11/03/20   Jacqlyn Larsen, PA-C  ?naproxen (NAPROSYN) 500 MG tablet Take 1 tablet (500 mg total) by mouth 2 (two) times daily as needed. 04/29/16   Ward, Ozella Almond, PA-C  ?naproxen (NAPROSYN) 500 MG tablet Take 1 tablet (500 mg total) by mouth 2 (two) times daily as needed for mild pain, moderate pain or headache (TAKE WITH MEALS.). 01/09/18   Street, [place], PA-C  ?   ? ?Allergies    ?Patient has no known allergies.   ? ?Review of Systems   ?Review of Systems  ?Neurological:  Positive for seizures.  ?All other systems reviewed and are negative. ? ?Physical Exam ?Updated Vital Signs ?BP (!) 146/80 (BP Location: Right Arm)   Pulse 97   Temp 98.1 ?F (36.7 ?C) (Oral)   Resp 14   LMP 05/17/2014   SpO2 96%  ? ?Physical Exam ?Vitals and nursing note reviewed.  ?Constitutional:   ?   Appearance: She is well-developed.  ?HENT:  ?   Head: Normocephalic and atraumatic.  ?   Comments: No visible head trauma ?Eyes:  ?   Conjunctiva/sclera: Conjunctivae normal.  ?   Pupils: Pupils are equal, round, and reactive to light.  ?Cardiovascular:  ?   Rate and Rhythm: Normal rate and regular rhythm.  ?   Heart sounds: Normal heart sounds.  ?Pulmonary:  ?  Effort: Pulmonary effort is normal.  ?   Breath sounds: Normal breath sounds.  ?Abdominal:  ?   General: Bowel sounds are normal.  ?   Palpations: Abdomen is soft.  ?Musculoskeletal:     ?   General: Normal range of motion.  ?   Cervical back: Normal range of motion.  ?Skin: ?   General: Skin is warm and dry.  ?Neurological:  ?   Mental Status: She is alert.  ?   Comments: Alert but seems confused, constantly fidgeting, moving al 4 extremities, does answer some questions and follows most commands (cannot remember to leave arm still for BP cuff reading)  ? ? ?ED Results / Procedures / Treatments   ?Labs ?(all labs ordered are listed, but only abnormal results are  displayed) ?Labs Reviewed - No data to display ? ?EKG ?None ? ?Radiology ?CT HEAD WO CONTRAST (5MM) ? ?Result Date: 12/30/2021 ?CLINICAL DATA:  New onset seizures, with head and neck trauma. EXAM: CT HEAD WITHOUT CONTRAST CT CERVICAL SPINE WITHOUT CONTRAST TECHNIQUE: Multidetector CT imaging of the head and cervical spine was performed following the standard protocol without intravenous contrast. Multiplanar CT image reconstructions of the cervical spine were also generated. RADIATION DOSE REDUCTION: This exam was performed according to the departmental dose-optimization program which includes automated exposure control, adjustment of the mA and/or kV according to patient size and/or use of iterative reconstruction technique. COMPARISON:  Cervical spine CT 11/03/2020, head CT 11/03/2020. FINDINGS: CT HEAD FINDINGS Brain: No evidence of acute infarction, hemorrhage, hydrocephalus, extra-axial collection or mass lesion/mass effect. Vascular: There calcifications at both carotid siphons but no hyperdense central vessels. Skull: Normal. Negative for fracture or focal lesion. Sinuses/Orbits: Chronic right maxillary sinus mucosal thickening with small air-fluid level is unchanged. There is increased left maxillary sinus concentric membrane disease and scattered opacity in the ethmoids. The frontal and sphenoid sinus and mastoid air cells are clear. A chronic depressed fracture of the medial wall right orbit is again noted chronic fracture deformity left zygomatic arch. Findings are unchanged. Other: None. CT CERVICAL SPINE FINDINGS Factors affecting image quality: Patient motion artifact. Image acquisition was attempted x2 with multilevel motion degradation on both attempts. Alignment: Reversed cervical lordosis again noted and similar to the previous exam. There is no AP listhesis or scoliosis. The facet joints are normally aligned but there is up to 3 mm new widening of the left C3-4 facet joint, with facet hypertrophy  disproportionately at this level unclear whether this is due to a left facet joint effusion or left-sided ligamentous injury. There are no joint surface erosions visible. The prior study demonstrated asymmetric facet hypertrophy at this level as well but no joint space widening. The other facet joints show lesser degenerative features without widening. Skull base and vertebrae: No acute fracture. No primary bone lesion or focal pathologic process. Soft tissues and spinal canal: No prevertebral fluid or swelling. No visible canal hematoma. There are mild calcifications in the proximal cervical ICA. Disc levels: Moderately advanced degenerative discopathy and bidirectional osteophytes are again noted C4-5 through C7-T1 associated with ventral cord surface flattening and deformity and spinal canal stenosis which is most significant at C4-5, C5-6 and C7-T1, where there is suspected mild spondylotic cord compression. This was seen previously. At C1-2, osteophytes and narrowing of the anterior atlantodental joint appear similar to the previous exam. The C2-3 and C3-4 discs are normal in heights. As above there is mild facet spurring except at the left C3-4 facet joint where the osteophytes  are more prominent. Additional uncinate spurring is seen with foraminal stenosis which is moderate on the left at C3-4, moderate to severe on the right at C4-5, bilaterally moderate to severe C5-6, bilaterally moderate at C6-7, mild-to-moderate on the left at C7-T1. Upper chest: Negative. Other: None. IMPRESSION: 1. No acute intracranial CT findings, depressed skull fractures or interval changes. 2. Chronic membrane thickening and fluid right maxillary sinus, new moderate membrane thickening left maxillary sinus common bilateral ethmoids. 3. Old right lamina papyracea depressed fracture. 4. No evidence of cervical spine fractures or listhesis. 5. Up to 3 mm new widening of the left C3-4 facet joint. There is disproportionately prominent  facet hypertrophy of this joint. The findings could be due to a new joint effusion or ligamentous injury. No joint surface erosion is seen. 6. Degenerative changes with 3 levels with posterior disc osteophyte complexes mo
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
## 2021-12-31 LAB — COMPREHENSIVE METABOLIC PANEL
ALT: 21 U/L (ref 0–44)
AST: 27 U/L (ref 15–41)
Albumin: 4.1 g/dL (ref 3.5–5.0)
Alkaline Phosphatase: 76 U/L (ref 38–126)
Anion gap: 5 (ref 5–15)
BUN: 10 mg/dL (ref 6–20)
CO2: 26 mmol/L (ref 22–32)
Calcium: 9.5 mg/dL (ref 8.9–10.3)
Chloride: 108 mmol/L (ref 98–111)
Creatinine, Ser: 0.56 mg/dL (ref 0.44–1.00)
GFR, Estimated: >60 mL/min (ref >60)
Glucose, Bld: 133 mg/dL — ABNORMAL HIGH (ref 70–99)
Potassium: 3.9 mmol/L (ref 3.5–5.1)
Sodium: 139 mmol/L (ref 135–145)
Total Bilirubin: 0.5 mg/dL (ref 0.3–1.2)
Total Protein: 7.8 g/dL (ref 6.5–8.1)

## 2021-12-31 LAB — MAGNESIUM: Magnesium: 2.4 mg/dL (ref 1.7–2.4)

## 2021-12-31 NOTE — Discharge Instructions (Signed)
No driving until seizure free for 6 months or until cleared by neurology. ?Please make sure to follow up in the neurology clinic-- I have placed a referral, they should be in contact with you to get this scheduled.  If you do not hear from them in the next few days, please call and follow-up on this. ?Return here for any new or acute changes. ?

## 2022-03-14 ENCOUNTER — Emergency Department (HOSPITAL_COMMUNITY): Payer: Self-pay

## 2022-03-14 ENCOUNTER — Other Ambulatory Visit: Payer: Self-pay

## 2022-03-14 ENCOUNTER — Encounter (HOSPITAL_COMMUNITY): Payer: Self-pay

## 2022-03-14 ENCOUNTER — Emergency Department (HOSPITAL_COMMUNITY)
Admission: EM | Admit: 2022-03-14 | Discharge: 2022-03-14 | Discharge status: Against Medical Advice | Payer: Self-pay | Attending: Emergency Medicine | Admitting: Emergency Medicine

## 2022-03-14 DIAGNOSIS — Z5321 Procedure and treatment not carried out due to patient leaving prior to being seen by health care provider: Secondary | ICD-10-CM | POA: Insufficient documentation

## 2022-03-14 DIAGNOSIS — R4182 Altered mental status, unspecified: Secondary | ICD-10-CM

## 2022-03-14 DIAGNOSIS — R569 Unspecified convulsions: Secondary | ICD-10-CM | POA: Insufficient documentation

## 2022-03-14 DIAGNOSIS — F19929 Other psychoactive substance use, unspecified with intoxication, unspecified: Secondary | ICD-10-CM

## 2022-03-14 HISTORY — DX: Unspecified convulsions: R56.9

## 2022-03-14 LAB — AMMONIA: Ammonia: 48 umol/L — ABNORMAL HIGH (ref 9–35)

## 2022-03-14 LAB — CBC WITH DIFFERENTIAL/PLATELET
Abs Immature Granulocytes: 0.02 10*3/uL (ref 0.00–0.07)
Basophils Absolute: 0 10*3/uL (ref 0.0–0.1)
Basophils Relative: 1 %
Eosinophils Absolute: 0.1 10*3/uL (ref 0.0–0.5)
Eosinophils Relative: 2 %
HCT: 39.6 % (ref 36.0–46.0)
Hemoglobin: 12.7 g/dL (ref 12.0–15.0)
Immature Granulocytes: 0 %
Lymphocytes Relative: 43 %
Lymphs Abs: 2.8 10*3/uL (ref 0.7–4.0)
MCH: 28.7 pg (ref 26.0–34.0)
MCHC: 32.1 g/dL (ref 30.0–36.0)
MCV: 89.4 fL (ref 80.0–100.0)
Monocytes Absolute: 0.7 10*3/uL (ref 0.1–1.0)
Monocytes Relative: 11 %
Neutro Abs: 2.8 10*3/uL (ref 1.7–7.7)
Neutrophils Relative %: 43 %
Platelets: 263 10*3/uL (ref 150–400)
RBC: 4.43 MIL/uL (ref 3.87–5.11)
RDW: 14.4 % (ref 11.5–15.5)
WBC: 6.5 10*3/uL (ref 4.0–10.5)
nRBC: 0 % (ref 0.0–0.2)

## 2022-03-14 LAB — URINALYSIS, ROUTINE W REFLEX MICROSCOPIC
Bacteria, UA: NONE SEEN
Bilirubin Urine: NEGATIVE
Glucose, UA: NEGATIVE mg/dL
Hgb urine dipstick: NEGATIVE
Ketones, ur: NEGATIVE mg/dL
Nitrite: NEGATIVE
Protein, ur: NEGATIVE mg/dL
Specific Gravity, Urine: 1.011 (ref 1.005–1.030)
pH: 5 (ref 5.0–8.0)

## 2022-03-14 LAB — COMPREHENSIVE METABOLIC PANEL
ALT: 19 U/L (ref 0–44)
AST: 21 U/L (ref 15–41)
Albumin: 3.9 g/dL (ref 3.5–5.0)
Alkaline Phosphatase: 67 U/L (ref 38–126)
Anion gap: 8 (ref 5–15)
BUN: 10 mg/dL (ref 6–20)
CO2: 23 mmol/L (ref 22–32)
Calcium: 9.4 mg/dL (ref 8.9–10.3)
Chloride: 110 mmol/L (ref 98–111)
Creatinine, Ser: 0.75 mg/dL (ref 0.44–1.00)
GFR, Estimated: >60 mL/min (ref >60)
Glucose, Bld: 130 mg/dL — ABNORMAL HIGH (ref 70–99)
Potassium: 3.7 mmol/L (ref 3.5–5.1)
Sodium: 141 mmol/L (ref 135–145)
Total Bilirubin: 0.3 mg/dL (ref 0.3–1.2)
Total Protein: 7.5 g/dL (ref 6.5–8.1)

## 2022-03-14 LAB — RAPID URINE DRUG SCREEN, HOSP PERFORMED
Amphetamines: NOT DETECTED
Barbiturates: NOT DETECTED
Benzodiazepines: NOT DETECTED
Cocaine: POSITIVE — AB
Opiates: NOT DETECTED
Tetrahydrocannabinol: NOT DETECTED

## 2022-03-14 LAB — TROPONIN I (HIGH SENSITIVITY)
Troponin I (High Sensitivity): 4 ng/L (ref <18)
Troponin I (High Sensitivity): 4 ng/L (ref <18)

## 2022-03-14 LAB — ACETAMINOPHEN LEVEL: Acetaminophen (Tylenol), Serum: <10 ug/mL — ABNORMAL LOW (ref 10–30)

## 2022-03-14 LAB — LACTIC ACID, PLASMA: Lactic Acid, Venous: 1.6 mmol/L (ref 0.5–1.9)

## 2022-03-14 LAB — ETHANOL: Alcohol, Ethyl (B): 202 mg/dL — ABNORMAL HIGH (ref <10)

## 2022-03-14 LAB — CK: Total CK: 69 U/L (ref 38–234)

## 2022-03-14 LAB — SALICYLATE LEVEL: Salicylate Lvl: <7 mg/dL — ABNORMAL LOW (ref 7.0–30.0)

## 2022-03-14 LAB — CBG MONITORING, ED: Glucose-Capillary: 127 mg/dL — ABNORMAL HIGH (ref 70–99)
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
## 2022-03-14 NOTE — ED Notes (Signed)
Pt A&Ox4, pt ambulates w/out assistance needed and is able to tolerate water and food w/out complications, nausea, or vomiting.  PA [place] notified and aware.

## 2022-03-14 NOTE — ED Notes (Signed)
Pt refusing to wait for dc by provider.  Pt left ama.  Pt refused to sign ama form.  Pt states understanding of possible risks of leaving AMA.  Pt ambulated out of ed with steady gait.
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
## 2022-03-19 ENCOUNTER — Encounter (HOSPITAL_COMMUNITY): Payer: Self-pay | Admitting: Emergency Medicine

## 2022-03-19 ENCOUNTER — Emergency Department (HOSPITAL_COMMUNITY)
Admission: EM | Admit: 2022-03-19 | Discharge: 2022-03-21 | Disposition: A | Discharge status: Other Acute Inpatient Hospital | Payer: Self-pay | Attending: Emergency Medicine | Admitting: Emergency Medicine

## 2022-03-19 DIAGNOSIS — T6592XA Toxic effect of unspecified substance, intentional self-harm, initial encounter: Secondary | ICD-10-CM

## 2022-03-19 DIAGNOSIS — F1010 Alcohol abuse, uncomplicated: Secondary | ICD-10-CM | POA: Insufficient documentation

## 2022-03-19 DIAGNOSIS — Z20822 Contact with and (suspected) exposure to covid-19: Secondary | ICD-10-CM | POA: Insufficient documentation

## 2022-03-19 DIAGNOSIS — R000 Tachycardia, unspecified: Secondary | ICD-10-CM | POA: Insufficient documentation

## 2022-03-19 DIAGNOSIS — X838XXA Intentional self-harm by other specified means, initial encounter: Secondary | ICD-10-CM | POA: Insufficient documentation

## 2022-03-19 DIAGNOSIS — T50902A Poisoning by unspecified drugs, medicaments and biological substances, intentional self-harm, initial encounter: Secondary | ICD-10-CM | POA: Insufficient documentation

## 2022-03-19 DIAGNOSIS — Y906 Blood alcohol level of 120-199 mg/100 ml: Secondary | ICD-10-CM | POA: Insufficient documentation

## 2022-03-19 DIAGNOSIS — F1490 Cocaine use, unspecified, uncomplicated: Secondary | ICD-10-CM | POA: Insufficient documentation

## 2022-03-19 DIAGNOSIS — Z79899 Other long term (current) drug therapy: Secondary | ICD-10-CM | POA: Insufficient documentation

## 2022-03-19 HISTORY — DX: Poisoning by unspecified drugs, medicaments and biological substances, intentional self-harm, initial encounter: T50.902A

## 2022-03-19 LAB — URINALYSIS, ROUTINE W REFLEX MICROSCOPIC
Bilirubin Urine: NEGATIVE
Glucose, UA: NEGATIVE mg/dL
Ketones, ur: NEGATIVE mg/dL
Nitrite: NEGATIVE
Protein, ur: NEGATIVE mg/dL
Specific Gravity, Urine: 1.004 — ABNORMAL LOW (ref 1.005–1.030)
pH: 5 (ref 5.0–8.0)

## 2022-03-19 LAB — CBC WITH DIFFERENTIAL/PLATELET
Abs Immature Granulocytes: 0.02 10*3/uL (ref 0.00–0.07)
Basophils Absolute: 0.1 10*3/uL (ref 0.0–0.1)
Basophils Relative: 1 %
Eosinophils Absolute: 0.1 10*3/uL (ref 0.0–0.5)
Eosinophils Relative: 2 %
HCT: 41.3 % (ref 36.0–46.0)
Hemoglobin: 13.4 g/dL (ref 12.0–15.0)
Immature Granulocytes: 0 %
Lymphocytes Relative: 45 %
Lymphs Abs: 3.1 10*3/uL (ref 0.7–4.0)
MCH: 28.2 pg (ref 26.0–34.0)
MCHC: 32.4 g/dL (ref 30.0–36.0)
MCV: 86.9 fL (ref 80.0–100.0)
Monocytes Absolute: 0.9 10*3/uL (ref 0.1–1.0)
Monocytes Relative: 12 %
Neutro Abs: 2.8 10*3/uL (ref 1.7–7.7)
Neutrophils Relative %: 40 %
Platelets: 233 10*3/uL (ref 150–400)
RBC: 4.75 MIL/uL (ref 3.87–5.11)
RDW: 14.3 % (ref 11.5–15.5)
WBC: 6.9 10*3/uL (ref 4.0–10.5)
nRBC: 0 % (ref 0.0–0.2)

## 2022-03-19 LAB — COMPREHENSIVE METABOLIC PANEL
ALT: 21 U/L (ref 0–44)
AST: 22 U/L (ref 15–41)
Albumin: 4.1 g/dL (ref 3.5–5.0)
Alkaline Phosphatase: 68 U/L (ref 38–126)
Anion gap: 14 (ref 5–15)
BUN: 8 mg/dL (ref 6–20)
CO2: 22 mmol/L (ref 22–32)
Calcium: 9.9 mg/dL (ref 8.9–10.3)
Chloride: 108 mmol/L (ref 98–111)
Creatinine, Ser: 0.58 mg/dL (ref 0.44–1.00)
GFR, Estimated: >60 mL/min (ref >60)
Glucose, Bld: 94 mg/dL (ref 70–99)
Potassium: 3.6 mmol/L (ref 3.5–5.1)
Sodium: 144 mmol/L (ref 135–145)
Total Bilirubin: 0.4 mg/dL (ref 0.3–1.2)
Total Protein: 7.7 g/dL (ref 6.5–8.1)

## 2022-03-19 LAB — RESP PANEL BY RT-PCR (FLU A&B, COVID) ARPGX2
Influenza A by PCR: NEGATIVE
Influenza B by PCR: NEGATIVE
SARS Coronavirus 2 by RT PCR: NEGATIVE

## 2022-03-19 LAB — RAPID URINE DRUG SCREEN, HOSP PERFORMED
Amphetamines: NOT DETECTED
Barbiturates: NOT DETECTED
Benzodiazepines: NOT DETECTED
Cocaine: POSITIVE — AB
Opiates: NOT DETECTED
Tetrahydrocannabinol: NOT DETECTED

## 2022-03-19 LAB — ETHANOL: Alcohol, Ethyl (B): 178 mg/dL — ABNORMAL HIGH (ref <10)

## 2022-03-19 LAB — ACETAMINOPHEN LEVEL: Acetaminophen (Tylenol), Serum: <10 ug/mL — ABNORMAL LOW (ref 10–30)

## 2022-03-19 LAB — PREGNANCY, URINE: Preg Test, Ur: NEGATIVE

## 2022-03-19 LAB — CBG MONITORING, ED: Glucose-Capillary: 127 mg/dL — ABNORMAL HIGH (ref 70–99)

## 2022-03-19 LAB — SALICYLATE LEVEL: Salicylate Lvl: <7 mg/dL — ABNORMAL LOW (ref 7.0–30.0)

## 2022-03-19 LAB — MAGNESIUM: Magnesium: 2.4 mg/dL (ref 1.7–2.4)

## 2022-03-19 MED ORDER — HALOPERIDOL LACTATE 5 MG/ML IJ SOLN: 1.0000 mg | Freq: Once | INTRAMUSCULAR | Status: DC

## 2022-03-19 MED ORDER — PANTOPRAZOLE SODIUM 40 MG IV SOLR
40.0000 mg | Freq: Once | INTRAVENOUS | Status: AC
  Administered 2022-03-19: 40 mg via INTRAVENOUS
  Filled 2022-03-19: qty 10

## 2022-03-19 MED ORDER — SODIUM CHLORIDE 0.9 % IV BOLUS
1000.0000 mL | Freq: Once | INTRAVENOUS | Status: AC
  Administered 2022-03-19: 1000 mL via INTRAVENOUS

## 2022-03-19 MED ORDER — LORAZEPAM 2 MG/ML IJ SOLN: 0.0000 mg | Freq: Two times a day (BID) | INTRAMUSCULAR | Status: DC

## 2022-03-19 MED ORDER — LORAZEPAM 2 MG/ML IJ SOLN
0.0000 mg | Freq: Four times a day (QID) | INTRAMUSCULAR | Status: DC
  Administered 2022-03-19: 2 mg via INTRAVENOUS
  Filled 2022-03-19: qty 1

## 2022-03-19 MED ORDER — LORAZEPAM 1 MG PO TABS
0.0000 mg | ORAL_TABLET | Freq: Four times a day (QID) | ORAL | Status: DC
  Administered 2022-03-19: 1 mg via ORAL
  Filled 2022-03-19: qty 1

## 2022-03-19 MED ORDER — LORAZEPAM 1 MG PO TABS
0.0000 mg | ORAL_TABLET | Freq: Two times a day (BID) | ORAL | Status: DC
  Filled 2022-03-19: qty 1

## 2022-03-19 MED ORDER — THIAMINE HCL 100 MG/ML IJ SOLN
100.0000 mg | Freq: Every day | INTRAMUSCULAR | Status: DC
  Administered 2022-03-19: 100 mg via INTRAVENOUS
  Filled 2022-03-19: qty 1
  Filled 2022-03-19: qty 2

## 2022-03-19 MED ORDER — LORAZEPAM 2 MG/ML IJ SOLN
0.5000 mg | Freq: Once | INTRAMUSCULAR | Status: AC
Start: 2022-03-19 — End: 2022-03-19
  Administered 2022-03-19: 0.5 mg via INTRAVENOUS
  Filled 2022-03-19: qty 1

## 2022-03-19 MED ORDER — SODIUM CHLORIDE 0.9 % IV BOLUS
500.0000 mL | Freq: Once | INTRAVENOUS | Status: AC
Start: 2022-03-19 — End: 2022-03-19
  Administered 2022-03-19: 500 mL via INTRAVENOUS

## 2022-03-19 MED ORDER — THIAMINE HCL 100 MG PO TABS
100.0000 mg | ORAL_TABLET | Freq: Every day | ORAL | Status: DC
  Administered 2022-03-20: 100 mg via ORAL
  Filled 2022-03-19: qty 1

## 2022-03-19 NOTE — ED Provider Notes (Signed)
I provided a substantive portion of the care of this patient.  I personally performed the entirety of the medical decision making for this encounter.      Patient is EKG per my interpretation shows borderline prolonged QT.  The patient has no significant QRS widening.  Patient took a handful of melatonin and likely some cyclobenzaprine.  She required four-point restraints as well as chemical sedation with Ativan.  Plan will be for admission for observation   Lorre Nick, MD 03/19/22 [phone]

## 2022-03-19 NOTE — Progress Notes (Signed)
Inpatient Behavioral Health Placement  Pt meets inpatient criteria per Eligha Bridegroom, NP. There are no available beds at [facility] Joslyn Devon, RN. Referral was sent to the following facilities;    Destination Service Provider Address Phone Fax  [facility]  [address], [place] [address] [phone]  [facility]  [address] [place] [address] [phone]  [facility] [address]  [address] [place] [address] [phone]  [facility]  [address] [place], [place] [address] [phone]  [facility]  [address] [place] [address] [phone]  [facility]  [address] [place], [place] [address] [phone]  [facility]  [address] [place] [address] [phone]  [facility]  [address] [place] [address] [phone]  [facility]  [address], [place] [address] [phone]  [facility]  [address] [place] [address] [phone]  [facility]  [address] [place] [address] [phone]  [facility]  [address] [place] [address] [phone]  [facility]  [address], [place] [address] [phone]  [facility]  [address] [place] [address] [phone]  [facility]  [address] [place], [place] [address] [phone]  [facility]  [address] [place] [address] [phone]  [facility]  [address],  [place]  [place] [address] [phone]   Situation ongoing,  CSW will follow up.   Maryjean Ka, MSW, LCSWA 03/19/2022  @ 1:32 PM

## 2022-03-19 NOTE — ED Notes (Signed)
Placed bilateral wrist restraints on pt. Will wait to place ankle restraints, (although order covers these as well). Pt's significant other is at the bedside. Pt continues to be agitated and trying to get out of the bed, pulling at lines, not following directions. Pt's significant other understands purpose of restraints and is remaining at bedside at this time.

## 2022-03-19 NOTE — ED Notes (Signed)
Pt has experienced intermittent episodes of gross body jerking and urinary incontinence. Pt remains difficult to arouse.  Charge RN made aware of same and pt assigned room.
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
## 2022-03-19 NOTE — ED Notes (Signed)
Pt now agitated and trying to get out of the bed. Pt's significant other at bedside along with Pa. Pt is still very confused and not following instructions.

## 2022-03-19 NOTE — Consult Note (Signed)
[facility] ED ASSESSMENT   Reason for Consult:  Suicide attempt Referring Physician:  Karmen Bongo, PA Patient Identification: Theresa Rios MRN:  [id number] ED Chief Complaint: Suicide attempt by drug ingestion [facility])  Diagnosis:  Principal Problem:   Suicide attempt by drug ingestion [facility])   ED Assessment Time Calculation: Start Time: 1300 Stop Time: 1330 Total Time in Minutes (Assessment Completion): 30   Subjective:   Theresa Rios is a 56 y.o. female patient BIB EMS for ingestion of unknown amount of melatonin. Husband was initially present at admission and reported he watched her ingest medications.  HPI:   Patient seen in her room at [facility] for face to face evaluation. Pt was previously in 4 pt restraints due to agitation and attempting to get out of bed, pull at lines, and not following directions. She was given IV Ativan 0.5mg  for agitation. When I see patient she is alert, oriented, and restraints have been removed. She is able to tell me her name, DOB, and current location. Her voice is hoarse and she says it hurts to speak so her responses are minimal. I ask her if she had a suicide attempt today and she tells me no, she only took 4 ibuprofen and there was no suicide attempt. When I tell her that usually 4 ibuprofen does not result in an ED admission for possible overdose she shrugs her shoulders and has no response. When I ask her if she has any suicidal thoughts she stares at me and will not give me an answer. When I ask if she has been struggling with depression recently she becomes tearful and shakes her head indicating yes. She denies any auditory or visual hallucinations. She would not talk to me about her cocaine use. I asked if it was okay if I could call her husband, she agreed to this.   Attempted to call her husband, Theresa Rios, at [phone] two separate times. He did not answer and his voicemail is full so I was unable to leave VM.   Theresa Rios is very minimal with her  responses, and it appears she is denying a suicide attempt although her husband has confirmed he watched her ingest unknown amount of pills. Theresa Rios is not able to tell me any details about previous psych hx or go into depth with current symptoms leading up to potential SA. She would not respond when I asked if she is taking any psychotropic medications. She is selective with her responses, and appears to be very dismissive of today's events. At this time psychiatry will recommend inpatient psychiatric treatment.     Past Psychiatric History:  unknown  Risk to Self or Others: Is the patient at risk to self? Yes Has the patient been a risk to self in the past 6 months? No Has the patient been a risk to self within the distant past? No Is the patient a risk to others? No Has the patient been a risk to others in the past 6 months? No Has the patient been a risk to others within the distant past? No  [place] Scale:  Flowsheet Row ED from 03/14/2022 in [facility] from 12/30/2021 in [facility] from 11/03/2020 in [facility] EMERGENCY DEPARTMENT  C-SSRS RISK CATEGORY No Risk No Risk No Risk       Past Medical History:  Past Medical History:  Diagnosis Date   Seizures (HCC)    History reviewed. No pertinent surgical  history. Family History: History reviewed. No pertinent family history.  Social History:  Social History   Substance and Sexual Activity  Alcohol Use Yes     Social History   Substance and Sexual Activity  Drug Use No    Social History   Socioeconomic History   Marital status: Single    Spouse name: Not on file   Number of children: Not on file   Years of education: Not on file   Highest education level: Not on file  Occupational History   Not on file  Tobacco Use   Smoking status: Every Day    Packs/day: 1.00    Types: Cigarettes   Smokeless tobacco: Never  Vaping Use    Vaping Use: Never used  Substance and Sexual Activity   Alcohol use: Yes   Drug use: No   Sexual activity: Not on file  Other Topics Concern   Not on file  Social History Narrative   Not on file   Social Determinants of Health   Financial Resource Strain: Not on file  Food Insecurity: Not on file  Transportation Needs: Not on file  Physical Activity: Not on file  Stress: Not on file  Social Connections: Not on file   Additional Social History:    Allergies:  No Known Allergies  Labs:  Results for orders placed or performed during the hospital encounter of 03/19/22 (from the past 48 hour(s))  Comprehensive metabolic panel     Status: None   Collection Time: 03/19/22  6:51 AM  Result Value Ref Range   Sodium 144 135 - 145 mmol/L   Potassium 3.6 3.5 - 5.1 mmol/L   Chloride 108 98 - 111 mmol/L   CO2 22 22 - 32 mmol/L   Glucose, Bld 94 70 - 99 mg/dL    Comment: Glucose reference range applies only to samples taken after fasting for at least 8 hours.   BUN 8 6 - 20 mg/dL   Creatinine, Ser 1.69 0.44 - 1.00 mg/dL   Calcium 9.9 8.9 - 67.8 mg/dL   Total Protein 7.7 6.5 - 8.1 g/dL   Albumin 4.1 3.5 - 5.0 g/dL   AST 22 15 - 41 U/L   ALT 21 0 - 44 U/L   Alkaline Phosphatase 68 38 - 126 U/L   Total Bilirubin 0.4 0.3 - 1.2 mg/dL   GFR, Estimated >93 >81 mL/min    Comment: (NOTE) Calculated using the CKD-EPI Creatinine Equation (2021)    Anion gap 14 5 - 15    Comment: Performed at [facility], [address] [place], [place] [address]  Salicylate level     Status: Abnormal   Collection Time: 03/19/22  6:51 AM  Result Value Ref Range   Salicylate Lvl <7.0 (L) 7.0 - 30.0 mg/dL    Comment: Performed at [facility], [address], [place], [place] [address]  Acetaminophen level     Status: Abnormal   Collection Time: 03/19/22  6:51 AM  Result Value Ref Range   Acetaminophen (Tylenol), Serum <10 (L) 10 - 30 ug/mL    Comment: (NOTE) Therapeutic concentrations  vary significantly. A range of 10-30 ug/mL  may be an effective concentration for many patients. However, some  are best treated at concentrations outside of this range. Acetaminophen concentrations >150 ug/mL at 4 hours after ingestion  and >50 ug/mL at 12 hours after ingestion are often associated with  toxic reactions.  Performed at [facility], [address] [place], [place] [address]   CBC with Differential     Status: None   Collection Time: 03/19/22  6:51 AM  Result Value Ref Range   WBC 6.9 4.0 - 10.5 K/uL   RBC 4.75 3.87 - 5.11 MIL/uL   Hemoglobin 13.4 12.0 - 15.0 g/dL   HCT 91.441.3 78.236.0 - 95.646.0 %   MCV 86.9 80.0 - 100.0 fL   MCH 28.2 26.0 - 34.0 pg   MCHC 32.4 30.0 - 36.0 g/dL   RDW 21.314.3 08.611.5 - 57.815.5 %   Platelets 233 150 - 400 K/uL   nRBC 0.0 0.0 - 0.2 %   Neutrophils Relative % 40 %   Neutro Abs 2.8 1.7 - 7.7 K/uL   Lymphocytes Relative 45 %   Lymphs Abs 3.1 0.7 - 4.0 K/uL   Monocytes Relative 12 %   Monocytes Absolute 0.9 0.1 - 1.0 K/uL   Eosinophils Relative 2 %   Eosinophils Absolute 0.1 0.0 - 0.5 K/uL   Basophils Relative 1 %   Basophils Absolute 0.1 0.0 - 0.1 K/uL   Immature Granulocytes 0 %   Abs Immature Granulocytes 0.02 0.00 - 0.07 K/uL    Comment: Performed at [facility], [address] [place], [place] [address]  Ethanol     Status: Abnormal   Collection Time: 03/19/22  6:51 AM  Result Value Ref Range   Alcohol, Ethyl (B) 178 (H) <10 mg/dL    Comment: (NOTE) Lowest detectable limit for serum alcohol is 10 mg/dL.  For medical purposes only. Performed at [facility], [address] [place], [place] [address]   Magnesium     Status: None   Collection Time: 03/19/22  7:36 AM  Result Value Ref Range   Magnesium 2.4 1.7 - 2.4 mg/dL    Comment: Performed at [facility]  Lab, [address] [place], [place] [address]  CBG monitoring, ED     Status: Abnormal   Collection Time: 03/19/22  8:21 AM  Result Value Ref Range    Glucose-Capillary 127 (H) 70 - 99 mg/dL    Comment: Glucose reference range applies only to samples taken after fasting for at least 8 hours.  Urinalysis, Routine w reflex microscopic Urine, Clean Catch     Status: Abnormal   Collection Time: 03/19/22  8:32 AM  Result Value Ref Range   Color, Urine YELLOW YELLOW   APPearance CLOUDY (A) CLEAR   Specific Gravity, Urine 1.004 (L) 1.005 - 1.030   pH 5.0 5.0 - 8.0   Glucose, UA NEGATIVE NEGATIVE mg/dL   Hgb urine dipstick LARGE (A) NEGATIVE   Bilirubin Urine NEGATIVE NEGATIVE   Ketones, ur NEGATIVE NEGATIVE mg/dL   Protein, ur NEGATIVE NEGATIVE mg/dL   Nitrite NEGATIVE NEGATIVE   Leukocytes,Ua MODERATE (A) NEGATIVE   RBC / HPF 6-10 0 - 5 RBC/hpf   WBC, UA 11-20 0 - 5 WBC/hpf   Bacteria, UA RARE (A) NONE SEEN   Squamous Epithelial / LPF 11-20 0 - 5   Mucus PRESENT     Comment: Performed at [facility]  Lab, [address] [place], [place] [address]  Rapid urine drug screen (hospital performed)     Status: Abnormal   Collection Time: 03/19/22  8:32 AM  Result Value Ref Range   Opiates NONE DETECTED NONE DETECTED   Cocaine POSITIVE (A) NONE DETECTED   Benzodiazepines NONE DETECTED NONE DETECTED   Amphetamines NONE DETECTED NONE DETECTED   Tetrahydrocannabinol NONE  DETECTED NONE DETECTED   Barbiturates NONE DETECTED NONE DETECTED    Comment: (NOTE) DRUG SCREEN FOR MEDICAL PURPOSES ONLY.  IF CONFIRMATION IS NEEDED FOR ANY PURPOSE, NOTIFY LAB WITHIN 5 DAYS.  LOWEST DETECTABLE LIMITS FOR URINE DRUG SCREEN Drug Class                     Cutoff (ng/mL) Amphetamine and metabolites    1000 Barbiturate and metabolites    200 Benzodiazepine                 200 Tricyclics and metabolites     300 Opiates and metabolites        300 Cocaine and metabolites        300 THC                            50 Performed at [facility] Lab, [address], [place], [place] [address]   Resp Panel by RT-PCR (Flu A&B, Covid) Anterior Nasal Swab      Status: None   Collection Time: 03/19/22 10:52 AM   Specimen: Anterior Nasal Swab  Result Value Ref Range   SARS Coronavirus 2 by RT PCR NEGATIVE NEGATIVE    Comment: (NOTE) SARS-CoV-2 target nucleic acids are NOT DETECTED.  The SARS-CoV-2 RNA is generally detectable in upper respiratory specimens during the acute phase of infection. The lowest concentration of SARS-CoV-2 viral copies this assay can detect is 138 copies/mL. A negative result does not preclude SARS-Cov-2 infection and should not be used as the sole basis for treatment or other patient management decisions. A negative result may occur with  improper specimen collection/handling, submission of specimen other than nasopharyngeal swab, presence of viral mutation(s) within the areas targeted by this assay, and inadequate number of viral copies(<138 copies/mL). A negative result must be combined with clinical observations, patient history, and epidemiological information. The expected result is Negative.  Fact Sheet for Patients:  BloggerCourse.com  Fact Sheet for Healthcare Providers:  SeriousBroker.it  This test is no t yet approved or cleared by the [place] FDA and  has been authorized for detection and/or diagnosis of SARS-CoV-2 by FDA under an Emergency Use Authorization (EUA). This EUA will remain  in effect (meaning this test can be used) for the duration of the COVID-19 declaration under Section 564(b)(1) of the Act, 21 U.S.C.section 360bbb-3(b)(1), unless the authorization is terminated  or revoked sooner.       Influenza A by PCR NEGATIVE NEGATIVE   Influenza B by PCR NEGATIVE NEGATIVE    Comment: (NOTE) The Xpert Xpress SARS-CoV-2/FLU/RSV plus assay is intended as an aid in the diagnosis of influenza from Nasopharyngeal swab specimens and should not be used as a sole basis for treatment. Nasal washings and aspirates are unacceptable for Xpert  Xpress SARS-CoV-2/FLU/RSV testing.  Fact Sheet for Patients: BloggerCourse.com  Fact Sheet for Healthcare Providers: SeriousBroker.it  This test is not yet approved or cleared by the [place] FDA and has been authorized for detection and/or diagnosis of SARS-CoV-2 by FDA under an Emergency Use Authorization (EUA). This EUA will remain in effect (meaning this test can be used) for the duration of the COVID-19 declaration under Section 564(b)(1) of the Act, 21 U.S.C. section 360bbb-3(b)(1), unless the authorization is terminated or revoked.  Performed at [facility] Lab, [address], [place], [place] [address]   Pregnancy, urine     Status: None  Collection Time: 03/19/22 11:08 AM  Result Value Ref Range   Preg Test, Ur NEGATIVE NEGATIVE    Comment:        THE SENSITIVITY OF THIS METHODOLOGY IS >20 mIU/mL. Performed at [facility] Lab, [address] [place], [place] [address]     Current Facility-Administered Medications  Medication Dose Route Frequency Provider Last Rate Last Admin   LORazepam (ATIVAN) injection 0-4 mg  0-4 mg Intravenous Q6H Al Decant, PA-C   2 mg at 03/19/22 1130   Or   LORazepam (ATIVAN) tablet 0-4 mg  0-4 mg Oral Q6H Al Decant, PA-C       [START ON 03/21/2022] LORazepam (ATIVAN) injection 0-4 mg  0-4 mg Intravenous Q12H Delice Bison F, PA-C       Or   [START ON 03/21/2022] LORazepam (ATIVAN) tablet 0-4 mg  0-4 mg Oral Q12H Delice Bison F, PA-C       thiamine tablet 100 mg  100 mg Oral Daily Delice Bison F, PA-C       Or   thiamine (B-1) injection 100 mg  100 mg Intravenous Daily Delice Bison F, PA-C   100 mg at 03/19/22 1129   Current Outpatient Medications  Medication Sig Dispense Refill   amoxicillin (AMOXIL) 500 MG tablet Take 500 mg by mouth 3 (three) times daily.     cyclobenzaprine (FLEXERIL) 10 MG tablet Take 10 mg by mouth at bedtime  as needed.     HYDROcodone-acetaminophen (NORCO) 5-325 MG tablet Take 1 tablet by mouth every 6 (six) hours as needed for severe pain. 10 tablet 0   ibuprofen (ADVIL) 800 MG tablet Take 800 mg by mouth 3 (three) times daily as needed for moderate pain.      Psychiatric Specialty Exam: Presentation  General Appearance: Disheveled  Eye Contact:Minimal  Speech:Slow  Speech Volume:Decreased  Handedness:No data recorded  Mood and Affect  Mood:Depressed  Affect:Congruent   Thought Process  Thought Processes:Coherent  Descriptions of Associations:Intact  Orientation:Full (Time, Place and Person)  Thought Content:Logical  History of Schizophrenia/Schizoaffective disorder:No data recorded Duration of Psychotic Symptoms:No data recorded Hallucinations:Hallucinations: None  Ideas of Reference:None  Suicidal Thoughts:Suicidal Thoughts: -- (would not answer)  Homicidal Thoughts:Homicidal Thoughts: No   Sensorium  Memory:Immediate Fair  Judgment:Poor  Insight:Poor   Executive Functions  Concentration:Fair  Attention Span:Fair  Recall:No data recorded Fund of Knowledge:Fair  Language:Fair   Psychomotor Activity  Psychomotor Activity:Psychomotor Activity: Normal   Assets  Assets:Social Support; Physical Health; Housing    Sleep  Sleep:Sleep: Fair   Physical Exam: Physical Exam Neurological:     Mental Status: She is alert.  Psychiatric:        Mood and Affect: Mood is depressed. Affect is flat.    Review of Systems  Psychiatric/Behavioral:  Positive for depression and substance abuse.    Blood pressure (!) 172/99, pulse (!) 110, temperature (!) 97.4 F (36.3 C), temperature source Axillary, resp. rate (!) 32, last menstrual period 05/17/2014, SpO2 96 %. There is no height or weight on file to calculate BMI.  Medical Decision Making: Case reviewed and discussed with Dr. Lucianne Muss. Will recommend psychiatric inpatient treatment. No current available  beds at [facility]. Psychiatry CSW notified to seek placement. LCSW, RN, and EDP notified of disposition.   - Will not start any psychotropic medications at this time. Would like to get a better history from patient or from husband about family history, past possible diagnosis or psychotropic medication trials.   Disposition: Recommend  psychiatric Inpatient admission when medically cleared.  Eligha Bridegroom, NP 03/19/2022 1:33 PM

## 2022-03-19 NOTE — ED Notes (Signed)
Pt appears alert now. Soft wrist restraints removed,

## 2022-03-19 NOTE — ED Notes (Addendum)
IVC documents complete and served; copy in medical records, original in [employer] forces logistics/support/administrative officer. Informed Dr. Freida Busman and RN [facility] of status of IVC, placed copies of IVC paper work in [employer] in [employer].IVC expires 03/26/22

## 2022-03-19 NOTE — ED Notes (Signed)
Poison control called to check on patient. They are closing there file with no further follow up since patient is back to baseline.

## 2022-03-19 NOTE — ED Provider Triage Note (Signed)
Emergency Medicine Provider Triage Evaluation Note  Theresa Rios , a 56 y.o. female  was evaluated in triage.  Pt complains of altered mental status.  Level 5 caveat.  Brought in by EMS with complaint of unknown amount of melatonin ingestion.  Per EMS the patient had erratic behavior and then fell asleep.  Patient is difficult to arouse in triage with snoring respirations.  She will open her eyes to sternal rub and then goes immediately back to sleep without a verbal response.  She also has intermittent episodes of shaking of her right upper extremity and has urinated herself.  Review of Systems  Positive: See above Negative:   Physical Exam  BP (!) 152/80 (BP Location: Right Arm)   Pulse 88   Temp (!) 97.4 F (36.3 C) (Axillary)   Resp 12   LMP 05/17/2014   SpO2 96%  Gen:   Somnolent Resp:  Normal effort, snoring MSK:    Other:  Abdomen is soft  Medical Decision Making  Medically screening exam initiated at 6:51 AM.  Appropriate orders placed.  Theresa Rios was informed that the remainder of the evaluation will be completed by another provider, this initial triage assessment does not replace that evaluation, and the importance of remaining in the ED until their evaluation is complete.  Triage RN aware that patient needs expedited room   Cristopher Peru, PA-C 03/19/22 [id number]

## 2022-03-19 NOTE — ED Triage Notes (Signed)
Pt brought to ED by [facility] with c/o ingestion of unknown amount if melatonin. EMS reports pt initially had erratic behavior and then fell asleep in transit to this facility. Pt currently asleep and difficult to arouse at time of triage.

## 2022-03-19 NOTE — ED Notes (Signed)
Pt up to the bathroom and to use the telephone.

## 2022-03-20 NOTE — Progress Notes (Signed)
Pt was accepted to [facility] 03/20/22; Bed Assignment 406-2   Please fax IVC faxed to [phone].  Pt meets inpatient criteria per   Attending Physician will be Dr. Phineas Inches  Report can be called to: Adult unit: [phone]  Pt can arrive after: 2200  Care Team notified: Irena Reichmann, RN, [facility] Rona Ravens, RN, and Eligha Bridegroom, NP.  Kelton Pillar, LCSWA 03/20/2022 @ 1:40 PM

## 2022-03-20 NOTE — ED Notes (Signed)
Breakfast order placed ?

## 2022-03-20 NOTE — ED Notes (Signed)
Patient will be transported to [facility] per request of receiving RN at [facility] to be called at 12am for transport due to IVC-Monique,RN

## 2022-03-20 NOTE — Progress Notes (Signed)
[facility] Psych ED Progress Note  03/20/2022 11:37 AM Theresa Rios  MRN:  [id number]   Subjective:   Patient seen this morning in her room for face to face evaluation. Pt states she does not remember anything from yesterday, and only remembers waking up in the hospital. She denies a suicide attempt by OD. She states she was just taking some ibuprofen. She is still very minimal in her responses and dismissive of the situation. She tells me her relationship with her partner is fine, and she does not need to be in the hospital. She denies current SI, HI, and AVH. She denies any depressive symptoms outside the hospital, and denies wanting to start any psychotropic medications. She denies any hx of psychiatric dx or medication trials. I ask her if she feels like she needs treatment for her substance abuse and she laughed and stated no.   I was able to speak with her partner, Mr. Gwynneth Aliment. He tells me they are not married, but have been together for a long time. He says that she is very depressed and needs help. He has been trying to leave her for around a year, and each time he tells her he does not want to continue the relationship she threatens to kill herself. He stated she previously tried overdosing on ibuprofen after an argument. He said prior to hospital they were drinking and using cocaine and they got into an argument where he said he did not want to be together and then she took a handful of purple pills. He also mentions to me that they are homeless. He is concerned for her and feels like she is lying about her current mental health status.   At this time, we will continue to recommend an IP search. Her partner brought up concerning facts, and I do feel like the patient is being very dismissive of the events of yesterday. She has little insight and is just focused on discharging.   Principal Problem: Suicide attempt by drug ingestion (HCC) Diagnosis:  Principal Problem:   Suicide attempt by drug  ingestion [facility])   ED Assessment Time Calculation: Start Time: 0940 Stop Time: 1000 Total Time in Minutes (Assessment Completion): 20   Past Psychiatric History:  No previous hx  [place] Scale:  Flowsheet Row ED from 03/19/2022 in [facility] EMERGENCY DEPART[facility] ED from 03/14/2022 in [facility] from 12/30/2021 in [facility]  C-SSRS RISK CATEGORY No Risk No Risk No Risk       Past Medical History:  Past Medical History:  Diagnosis Date   Seizures (HCC)    History reviewed. No pertinent surgical history. Family History: History reviewed. No pertinent family history.  Social History:  Social History   Substance and Sexual Activity  Alcohol Use Yes     Social History   Substance and Sexual Activity  Drug Use No    Social History   Socioeconomic History   Marital status: Single    Spouse name: Not on file   Number of children: Not on file   Years of education: Not on file   Highest education level: Not on file  Occupational History   Not on file  Tobacco Use   Smoking status: Every Day    Packs/day: 1.00    Types: Cigarettes   Smokeless tobacco: Never  Vaping Use   Vaping Use: Never used  Substance and Sexual Activity   Alcohol use: Yes   Drug  use: No   Sexual activity: Not on file  Other Topics Concern   Not on file  Social History Narrative   Not on file   Social Determinants of Health   Financial Resource Strain: Not on file  Food Insecurity: Not on file  Transportation Needs: Not on file  Physical Activity: Not on file  Stress: Not on file  Social Connections: Not on file    Sleep: Good  Appetite:  Good  Current Medications: Current Facility-Administered Medications  Medication Dose Route Frequency Provider Last Rate Last Admin   LORazepam (ATIVAN) injection 0-4 mg  0-4 mg Intravenous Q6H Delice Bison F, PA-C   2 mg at 03/19/22 1130   Or    LORazepam (ATIVAN) tablet 0-4 mg  0-4 mg Oral Q6H Delice Bison F, PA-C   1 mg at 03/19/22 1646   [START ON 03/21/2022] LORazepam (ATIVAN) injection 0-4 mg  0-4 mg Intravenous Q12H Delice Bison F, PA-C       Or   [START ON 03/21/2022] LORazepam (ATIVAN) tablet 0-4 mg  0-4 mg Oral Q12H Delice Bison F, PA-C       thiamine tablet 100 mg  100 mg Oral Daily Delice Bison F, PA-C   100 mg at 03/20/22 1039   Or   thiamine (B-1) injection 100 mg  100 mg Intravenous Daily Delice Bison F, PA-C   100 mg at 03/19/22 1129   Current Outpatient Medications  Medication Sig Dispense Refill   ibuprofen (ADVIL) 800 MG tablet Take 800 mg by mouth 3 (three) times daily as needed for moderate pain.     HYDROcodone-acetaminophen (NORCO) 5-325 MG tablet Take 1 tablet by mouth every 6 (six) hours as needed for severe pain. (Patient not taking: Reported on 03/19/2022) 10 tablet 0    Lab Results:  Results for orders placed or performed during the hospital encounter of 03/19/22 (from the past 48 hour(s))  Comprehensive metabolic panel     Status: None   Collection Time: 03/19/22  6:51 AM  Result Value Ref Range   Sodium 144 135 - 145 mmol/L   Potassium 3.6 3.5 - 5.1 mmol/L   Chloride 108 98 - 111 mmol/L   CO2 22 22 - 32 mmol/L   Glucose, Bld 94 70 - 99 mg/dL    Comment: Glucose reference range applies only to samples taken after fasting for at least 8 hours.   BUN 8 6 - 20 mg/dL   Creatinine, Ser 3.21 0.44 - 1.00 mg/dL   Calcium 9.9 8.9 - 22.4 mg/dL   Total Protein 7.7 6.5 - 8.1 g/dL   Albumin 4.1 3.5 - 5.0 g/dL   AST 22 15 - 41 U/L   ALT 21 0 - 44 U/L   Alkaline Phosphatase 68 38 - 126 U/L   Total Bilirubin 0.4 0.3 - 1.2 mg/dL   GFR, Estimated >82 >50 mL/min    Comment: (NOTE) Calculated using the CKD-EPI Creatinine Equation (2021)    Anion gap 14 5 - 15    Comment: Performed at [facility], [address], [place], [place] [address]  Salicylate level     Status: Abnormal    Collection Time: 03/19/22  6:51 AM  Result Value Ref Range   Salicylate Lvl <7.0 (L) 7.0 - 30.0 mg/dL    Comment: Performed at [facility], [address] [place], [place] [address]  Acetaminophen level     Status: Abnormal   Collection Time: 03/19/22  6:51 AM  Result Value Ref Range   Acetaminophen (Tylenol), Serum <10 (L) 10 - 30 ug/mL    Comment: (NOTE) Therapeutic concentrations vary significantly. A range of 10-30 ug/mL  may be an effective concentration for many patients. However, some  are best treated at concentrations outside of this range. Acetaminophen concentrations >150 ug/mL at 4 hours after ingestion  and >50 ug/mL at 12 hours after ingestion are often associated with  toxic reactions.  Performed at [facility], [place] [address] [place], [place] [address]   CBC with Differential     Status: None   Collection Time: 03/19/22  6:51 AM  Result Value Ref Range   WBC 6.9 4.0 - 10.5 K/uL   RBC 4.75 3.87 - 5.11 MIL/uL   Hemoglobin 13.4 12.0 - 15.0 g/dL   HCT 41.3 36.0 - 46.0 %   MCV 86.9 80.0 - 100.0 fL   MCH 28.2 26.0 - 34.0 pg   MCHC 32.4 30.0 - 36.0 g/dL   RDW 14.3 11.5 - 15.5 %   Platelets 233 150 - 400 K/uL   nRBC 0.0 0.0 - 0.2 %   Neutrophils Relative % 40 %   Neutro Abs 2.8 1.7 - 7.7 K/uL   Lymphocytes Relative 45 %   Lymphs Abs 3.1 0.7 - 4.0 K/uL   Monocytes Relative 12 %   Monocytes Absolute 0.9 0.1 - 1.0 K/uL   Eosinophils Relative 2 %   Eosinophils Absolute 0.1 0.0 - 0.5 K/uL   Basophils Relative 1 %   Basophils Absolute 0.1 0.0 - 0.1 K/uL   Immature Granulocytes 0 %   Abs Immature Granulocytes 0.02 0.00 - 0.07 K/uL    Comment: Performed at [facility], [address] [place], [place] [address]  Ethanol     Status: Abnormal   Collection Time: 03/19/22  6:51 AM  Result Value Ref Range   Alcohol, Ethyl (B) 178 (H) <10 mg/dL    Comment: (NOTE) Lowest detectable limit for serum alcohol is 10 mg/dL.  For medical purposes  only. Performed at [facility], [place] [address] [place], [place] [address]   Magnesium     Status: None   Collection Time: 03/19/22  7:36 AM  Result Value Ref Range   Magnesium 2.4 1.7 - 2.4 mg/dL    Comment: Performed at [place] [address], [place],  [address]  CBG monitoring, ED     Status: Abnormal   Collection Time: 03/19/22  8:21 AM  Result Value Ref Range   Glucose-Capillary 127 (H) 70 - 99 mg/dL    Comment: Glucose reference range applies only to samples taken after fasting for at least 8 hours.  Urinalysis, Routine w reflex microscopic Urine, Clean Catch     Status: Abnormal   Collection Time: 03/19/22  8:32 AM  Result Value Ref Range   Color, Urine YELLOW YELLOW   APPearance CLOUDY (A) CLEAR   Specific Gravity, Urine 1.004 (L) 1.005 - 1.030   pH 5.0 5.0 - 8.0   Glucose, UA NEGATIVE NEGATIVE mg/dL   Hgb urine dipstick LARGE (A) NEGATIVE   Bilirubin Urine NEGATIVE NEGATIVE   Ketones, ur NEGATIVE NEGATIVE mg/dL   Protein, ur NEGATIVE NEGATIVE mg/dL   Nitrite NEGATIVE NEGATIVE   Leukocytes,Ua MODERATE (A) NEGATIVE   RBC / HPF 6-10 0 - 5 RBC/hpf   WBC, UA 11-20 0 - 5 WBC/hpf   Bacteria, UA RARE (A) NONE SEEN   Squamous Epithelial / LPF 11-20  0 - 5   Mucus PRESENT     Comment: Performed at [facility], [place] [address] [place], [place] [address]  Rapid urine drug screen (hospital performed)     Status: Abnormal   Collection Time: 03/19/22  8:32 AM  Result Value Ref Range   Opiates NONE DETECTED NONE DETECTED   Cocaine POSITIVE (A) NONE DETECTED   Benzodiazepines NONE DETECTED NONE DETECTED   Amphetamines NONE DETECTED NONE DETECTED   Tetrahydrocannabinol NONE DETECTED NONE DETECTED   Barbiturates NONE DETECTED NONE DETECTED    Comment: (NOTE) DRUG SCREEN FOR MEDICAL PURPOSES ONLY.  IF CONFIRMATION IS NEEDED FOR ANY PURPOSE, NOTIFY LAB WITHIN 5 DAYS.  LOWEST DETECTABLE LIMITS FOR URINE DRUG SCREEN Drug Class                      Cutoff (ng/mL) Amphetamine and metabolites    1000 Barbiturate and metabolites    200 Benzodiazepine                 [id number] Tricyclics and metabolites     300 Opiates and metabolites        300 Cocaine and metabolites        300 THC                            50 Performed at [facility], [place] [address] [place], [place] [address]   Resp Panel by RT-PCR (Flu A&B, Covid) Anterior Nasal Swab     Status: None   Collection Time: 03/19/22 10:52 AM   Specimen: Anterior Nasal Swab  Result Value Ref Range   SARS Coronavirus 2 by RT PCR NEGATIVE NEGATIVE    Comment: (NOTE) SARS-CoV-2 target nucleic acids are NOT DETECTED.  The SARS-CoV-2 RNA is generally detectable in upper respiratory specimens during the acute phase of infection. The lowest concentration of SARS-CoV-2 viral copies this assay can detect is 138 copies/mL. A negative result does not preclude SARS-Cov-2 infection and should not be used as the sole basis for treatment or other patient management decisions. A negative result may occur with  improper specimen collection/handling, submission of specimen other than nasopharyngeal swab, presence of viral mutation(s) within the areas targeted by this assay, and inadequate number of viral copies(<138 copies/mL). A negative result must be combined with clinical observations, patient history, and epidemiological information. The expected result is Negative.  Fact Sheet for Patients:  EntrepreneurPulse.com.au  Fact Sheet for Healthcare Providers:  IncredibleEmployment.be  This test is no t yet approved or cleared by the [place] FDA and  has been authorized for detection and/or diagnosis of SARS-CoV-2 by FDA under an Emergency Use Authorization (EUA). This EUA will remain  in effect (meaning this test can be used) for the duration of the COVID-19 declaration under Section 564(b)(1) of the Act, 21 U.S.C.section 360bbb-3(b)(1), unless the  authorization is terminated  or revoked sooner.       Influenza A by PCR NEGATIVE NEGATIVE   Influenza B by PCR NEGATIVE NEGATIVE    Comment: (NOTE) The Xpert Xpress SARS-CoV-2/FLU/RSV plus assay is intended as an aid in the diagnosis of influenza from Nasopharyngeal swab specimens and should not be used as a sole basis for treatment. Nasal washings and aspirates are unacceptable for Xpert Xpress SARS-CoV-2/FLU/RSV testing.  Fact Sheet for Patients: EntrepreneurPulse.com.au  Fact Sheet for Healthcare Providers: IncredibleEmployment.be  This test is not yet approved or cleared by the  [place] Architectural technologist and has been authorized for detection and/or diagnosis of SARS-CoV-2 by FDA under an Print production planner (EUA). This EUA will remain in effect (meaning this test can be used) for the duration of the COVID-19 declaration under Section 564(b)(1) of the Act, 21 U.S.C. section 360bbb-3(b)(1), unless the authorization is terminated or revoked.  Performed at [facility], [place] [address] [place], [place] [address]   Pregnancy, urine     Status: None   Collection Time: 03/19/22 11:08 AM  Result Value Ref Range   Preg Test, Ur NEGATIVE NEGATIVE    Comment:        THE SENSITIVITY OF THIS METHODOLOGY IS >20 mIU/mL. Performed at [facility], [place] [address] [place],  [address]     Blood Alcohol level:  Lab Results  Component Value Date   ETH 178 (H) 03/19/2022   ETH 202 (H) 03/14/2022    Physical Findings:  CIWA:  CIWA-Ar Total: 0  Psychiatric Specialty Exam:  Presentation  General Appearance: Fairly Groomed  Eye Contact:Fair  Speech:Clear and Coherent  Speech Volume:Normal  Handedness:No data recorded  Mood and Affect  Mood:Depressed  Affect:Flat   Thought Process  Thought Processes:Coherent  Descriptions of Associations:Intact  Orientation:Full (Time, Place and Person)  Thought  Content:Logical  History of Schizophrenia/Schizoaffective disorder:No data recorded Duration of Psychotic Symptoms:No data recorded Hallucinations:Hallucinations: None  Ideas of Reference:None  Suicidal Thoughts:Suicidal Thoughts: No  Homicidal Thoughts:Homicidal Thoughts: No   Sensorium  Memory:Immediate Fair  Judgment:Poor  Insight:Fair   Executive Functions  Concentration:Fair  Attention Span:Good  [place] of Knowledge:Good  Language:Good   Psychomotor Activity  Psychomotor Activity:Psychomotor Activity: Normal   Assets  Assets:Desire for Improvement; Physical Health; Social Support   Sleep  Sleep:Sleep: Fair    Physical Exam: Physical Exam Neurological:     Mental Status: She is alert and oriented to person, place, and time.    Review of Systems  Psychiatric/Behavioral:  Positive for depression.   All other systems reviewed and are negative.  Blood pressure 118/66, pulse 81, temperature 97.9 F (36.6 C), temperature source Oral, resp. rate 15, last menstrual period 05/17/2014, SpO2 97 %. There is no height or weight on file to calculate BMI.   Medical Decision Making: -Offered to start antidepressant trial if she felt like she was struggling with depression, pt declined - At this time will continue to recommend IVC and inpatient treatment. LCSW, EDP, and RN notified. There are no [facility] beds available at this time, CSW is faxing out to other facilities.   Vesta Mixer, NP 03/20/2022, 11:37 AM

## 2022-03-20 NOTE — Progress Notes (Addendum)
Pt accepted to [facility] 03/20/22.  Kelton Pillar, LCSWA 03/20/2022 @ 1:19 PM

## 2022-03-21 ENCOUNTER — Other Ambulatory Visit: Payer: Self-pay

## 2022-03-21 ENCOUNTER — Encounter (HOSPITAL_COMMUNITY): Payer: Self-pay | Admitting: Nurse Practitioner

## 2022-03-21 ENCOUNTER — Inpatient Hospital Stay (HOSPITAL_COMMUNITY)
Admission: AD | Admit: 2022-03-21 | Discharge: 2022-03-27 | DRG: 885 | Disposition: A | Discharge status: Home / Self Care | Payer: Federal, State, Local not specified - Other | Source: Intra-hospital | Attending: Psychiatry | Admitting: Psychiatry

## 2022-03-21 DIAGNOSIS — N390 Urinary tract infection, site not specified: Secondary | ICD-10-CM | POA: Diagnosis present

## 2022-03-21 DIAGNOSIS — F17210 Nicotine dependence, cigarettes, uncomplicated: Secondary | ICD-10-CM | POA: Diagnosis present

## 2022-03-21 DIAGNOSIS — R7303 Prediabetes: Secondary | ICD-10-CM | POA: Diagnosis present

## 2022-03-21 DIAGNOSIS — Z5900 Homelessness unspecified: Secondary | ICD-10-CM | POA: Diagnosis not present

## 2022-03-21 DIAGNOSIS — F1090 Alcohol use, unspecified, uncomplicated: Secondary | ICD-10-CM

## 2022-03-21 DIAGNOSIS — F411 Generalized anxiety disorder: Secondary | ICD-10-CM

## 2022-03-21 DIAGNOSIS — F322 Major depressive disorder, single episode, severe without psychotic features: Secondary | ICD-10-CM | POA: Diagnosis present

## 2022-03-21 DIAGNOSIS — F329 Major depressive disorder, single episode, unspecified: Secondary | ICD-10-CM | POA: Diagnosis present

## 2022-03-21 DIAGNOSIS — G4700 Insomnia, unspecified: Secondary | ICD-10-CM

## 2022-03-21 DIAGNOSIS — F332 Major depressive disorder, recurrent severe without psychotic features: Secondary | ICD-10-CM | POA: Insufficient documentation

## 2022-03-21 DIAGNOSIS — M549 Dorsalgia, unspecified: Secondary | ICD-10-CM | POA: Diagnosis present

## 2022-03-21 DIAGNOSIS — Z23 Encounter for immunization: Secondary | ICD-10-CM

## 2022-03-21 DIAGNOSIS — F1490 Cocaine use, unspecified, uncomplicated: Secondary | ICD-10-CM | POA: Diagnosis present

## 2022-03-21 DIAGNOSIS — F1010 Alcohol abuse, uncomplicated: Secondary | ICD-10-CM | POA: Diagnosis present

## 2022-03-21 HISTORY — DX: Generalized anxiety disorder: F41.1

## 2022-03-21 LAB — URINE CULTURE

## 2022-03-21 MED ORDER — LORAZEPAM 1 MG PO TABS: 1.0000 mg | ORAL_TABLET | Freq: Four times a day (QID) | ORAL | Status: AC | PRN

## 2022-03-21 MED ORDER — MAGNESIUM HYDROXIDE 400 MG/5ML PO SUSP: 30.0000 mL | Freq: Every day | ORAL | Status: DC | PRN

## 2022-03-21 MED ORDER — ACETAMINOPHEN 325 MG PO TABS
650.0000 mg | ORAL_TABLET | Freq: Four times a day (QID) | ORAL | Status: DC | PRN
  Administered 2022-03-22 – 2022-03-26 (×8): 650 mg via ORAL
  Filled 2022-03-21 (×9): qty 2

## 2022-03-21 MED ORDER — LORAZEPAM 1 MG PO TABS
1.0000 mg | ORAL_TABLET | Freq: Four times a day (QID) | ORAL | Status: AC
  Administered 2022-03-21 (×3): 1 mg via ORAL
  Filled 2022-03-21 (×3): qty 1

## 2022-03-21 MED ORDER — ONDANSETRON 4 MG PO TBDP: 4.0000 mg | ORAL_TABLET | Freq: Four times a day (QID) | ORAL | Status: AC | PRN

## 2022-03-21 MED ORDER — LOPERAMIDE HCL 2 MG PO CAPS: 2.0000 mg | ORAL_CAPSULE | ORAL | Status: AC | PRN

## 2022-03-21 MED ORDER — SERTRALINE HCL 50 MG PO TABS
50.0000 mg | ORAL_TABLET | Freq: Every day | ORAL | Status: DC
  Administered 2022-03-21 – 2022-03-22 (×2): 50 mg via ORAL
  Filled 2022-03-21 (×5): qty 1

## 2022-03-21 MED ORDER — HYDROXYZINE HCL 25 MG PO TABS
25.0000 mg | ORAL_TABLET | Freq: Three times a day (TID) | ORAL | Status: DC | PRN
  Administered 2022-03-21 – 2022-03-27 (×6): 25 mg via ORAL
  Filled 2022-03-21 (×2): qty 10
  Filled 2022-03-21 (×6): qty 1

## 2022-03-21 MED ORDER — ADULT MULTIVITAMIN W/MINERALS CH
1.0000 | ORAL_TABLET | Freq: Every day | ORAL | Status: DC
  Administered 2022-03-21 – 2022-03-27 (×7): 1 via ORAL
  Filled 2022-03-21 (×10): qty 1

## 2022-03-21 MED ORDER — LORAZEPAM 1 MG PO TABS
1.0000 mg | ORAL_TABLET | Freq: Two times a day (BID) | ORAL | Status: AC
  Administered 2022-03-23 (×2): 1 mg via ORAL
  Filled 2022-03-21 (×2): qty 1

## 2022-03-21 MED ORDER — NICOTINE 14 MG/24HR TD PT24
14.0000 mg | MEDICATED_PATCH | Freq: Every day | TRANSDERMAL | Status: DC
  Administered 2022-03-21 – 2022-03-27 (×7): 14 mg via TRANSDERMAL
  Filled 2022-03-21 (×10): qty 1

## 2022-03-21 MED ORDER — THIAMINE HCL 100 MG PO TABS
100.0000 mg | ORAL_TABLET | Freq: Every day | ORAL | Status: DC
  Administered 2022-03-22 – 2022-03-27 (×6): 100 mg via ORAL
  Filled 2022-03-21 (×8): qty 1

## 2022-03-21 MED ORDER — PNEUMOCOCCAL 20-VAL CONJ VACC 0.5 ML IM SUSY
0.5000 mL | PREFILLED_SYRINGE | INTRAMUSCULAR | Status: AC
  Administered 2022-03-22: 0.5 mL via INTRAMUSCULAR
  Filled 2022-03-21: qty 0.5

## 2022-03-21 MED ORDER — GABAPENTIN 100 MG PO CAPS
100.0000 mg | ORAL_CAPSULE | Freq: Three times a day (TID) | ORAL | Status: DC
  Administered 2022-03-21 – 2022-03-22 (×3): 100 mg via ORAL
  Filled 2022-03-21 (×9): qty 1

## 2022-03-21 MED ORDER — LORAZEPAM 1 MG PO TABS
1.0000 mg | ORAL_TABLET | Freq: Three times a day (TID) | ORAL | Status: AC
  Administered 2022-03-22 (×3): 1 mg via ORAL
  Filled 2022-03-21 (×3): qty 1

## 2022-03-21 MED ORDER — ALUM & MAG HYDROXIDE-SIMETH 200-200-20 MG/5ML PO SUSP: 30.0000 mL | ORAL | Status: DC | PRN

## 2022-03-21 MED ORDER — LORAZEPAM 1 MG PO TABS
1.0000 mg | ORAL_TABLET | Freq: Every day | ORAL | Status: AC
  Administered 2022-03-24: 1 mg via ORAL
  Filled 2022-03-21: qty 1

## 2022-03-21 NOTE — [facility] Counselor (Signed)
Adult Comprehensive Assessment  Patient ID: Theresa Rios, female   DOB: 01-26-1966, 56 y.o.   MRN: [id number]  Information Source: Information source: Patient  Current Stressors:  Patient states their primary concerns and needs for treatment are:: "Depression, anxiety, and a suicide attempt" Patient states their goals for this hospitilization and ongoing recovery are:: "To get help with housing and finances" Educational / Learning stressors: Pt reports having a 12th grade education Employment / Job issues: Pt reports being unemployed Family Relationships: Pt reports being close to 3 of her 12 siblings Surveyor, quantity / Lack of resources (include bankruptcy): Pt reports having no income and no Water engineer / Lack of housing: Pt reports they are experiencing homelessness Physical health (include injuries & life threatening diseases): Pt reports no stressors Social relationships: Pt reports having few social relationships Substance abuse: Pt reports using Alcohol daily Bereavement / Loss: Pt reports her mother passed away 3 years ago and her father passed away "several years ago"  Living/Environment/Situation:  Living Arrangements: Alone Living conditions (as described by patient or guardian): Experiencing Homelessness Who else lives in the home?: Alone How long has patient lived in current situation?: 2 months What is atmosphere in current home: Temporary, Dangerous  Family History:  Marital status: Separated Separated, when?: 6 months ago What types of issues is patient dealing with in the relationship?: "He moved back to his mother's home" Are you sexually active?: No What is your sexual orientation?: Heterosexual Has your sexual activity been affected by drugs, alcohol, medication, or emotional stress?: No Does patient have children?: No  Childhood History:  By whom was/is the patient raised?: Both parents Description of patient's relationship with caregiver when they  were a child: "I was close with my mother but I dont really remember my father but I know he was there" Patient's description of current relationship with people who raised him/her: "My mother passed away 3 years ago and my father passed away several years ago" How were you disciplined when you got in trouble as a child/adolescent?: Spankings Does patient have siblings?: Yes Number of Siblings: 51 Description of patient's current relationship with siblings: "I had 11 siblings and I only talk to 3 of them now but I am distant with them too" Did patient suffer any verbal/emotional/physical/sexual abuse as a child?: No Did patient suffer from severe childhood neglect?: No Has patient ever been sexually abused/assaulted/raped as an adolescent or adult?: No Was the patient ever a victim of a crime or a disaster?: No Witnessed domestic violence?: No Has patient been affected by domestic violence as an adult?: No  Education:  Highest grade of school patient has completed: 12th grade Currently a student?: No Learning disability?: No  Employment/Work Situation:   Employment Situation: Unemployed Patient's Job has Been Impacted by Current Illness: No What is the Longest Time Patient has Held a Job?: 13 years Where was the Patient Employed at that Time?: [employer] Has Patient ever Been in the [employer]?: No  Financial Resources:   Surveyor, quantity resources: [employer], No income Does patient have a Lawyer or guardian?: No  Alcohol/Substance Abuse:   What has been your use of drugs/alcohol within the last 12 months?: Pt reports using Alcohol daily If attempted suicide, did drugs/alcohol play a role in this?: No Alcohol/Substance Abuse Treatment Hx: Denies past history Has alcohol/substance abuse ever caused legal problems?: No  Social Support System:   Conservation officer, nature Support System: None Describe Community Support System: "I don't have one" Type of faith/religion:  Christian How  does patient's faith help to cope with current illness?: Church and Prayer  Leisure/Recreation:   Do You Have Hobbies?: Yes Leisure and Hobbies: "Going to the park"  Strengths/Needs:   What is the patient's perception of their strengths?: Being consistant and resiliant Patient states they can use these personal strengths during their treatment to contribute to their recovery: Pt did not specify Patient states these barriers may affect/interfere with their treatment: None Patient states these barriers may affect their return to the community: None Other important information patient would like considered in planning for their treatment: None  Discharge Plan:   Currently receiving community mental health services: No Patient states concerns and preferences for aftercare planning are: Pt is interested in therapy and medication management Patient states they will know when they are safe and ready for discharge when: "I am ready to leave now" Does patient have access to transportation?: Yes (Husband) Does patient have financial barriers related to discharge medications?: Yes Patient description of barriers related to discharge medications: No income or medical insurance Plan for living situation after discharge: Pt will be provided with a list of shelter and housing resources Will patient be returning to same living situation after discharge?: No  Summary/Recommendations:   Summary and Recommendations (to be completed by the evaluator): Theresa Rios is a 56 year old, female, who was admitted to the hospital due to worsening depression, anxiety, and a suicide attempt.  The Pt reports taking an unknown amound of sleep aids.  She states that her symptoms began to worsen 2 months ago when she began experiencing homelessness.  The Pt reports that she is current separated from her husband due to him moving back to his mother's home.  She states that she has contact with 3 of her 11 siblings and only  speaks to them occasionally.  She reports that her mother passed away 3 years ago and her father passed away "several years ago".  She denies any childhood abuse or trauma.  The Pt reports being unemployed and states that she has no income and no Aeronautical engineer.  The Pt reports that she drinks alcohol daily and she denies any other type of substance use at this time.  The Pt also denies any current or previous substance use treatment and states that she is not interested in attendinig those services at this time.  While in the hospital the Pt can benefit from crisis stabilization, medication evaluation, group therapy, psycho-education, case management, and discharge planning.  Upon discharge the Pt would like to go to a shelter.  The Pt will be provided with a list of shelter and housing resources within the local area.  It is recommended that the Pt follow-up with a local outpatient provider for therapy and medication management services.  Aram Beecham. 03/21/2022

## 2022-03-21 NOTE — [facility] Counselor (Signed)
CSW provided the Pt with a packet that contains information including shelter and housing resources, list of [place] [facility], free and reduced price food information, clothing resources, crisis center information, a GoodRX card, and suicide prevention information.   

## 2022-03-21 NOTE — Tx Team (Signed)
Initial Treatment Plan 03/21/2022 4:52 AM Theresa Rios SPQ:[id number]    PATIENT STRESSORS: Financial difficulties   Occupational concerns   Substance abuse     PATIENT STRENGTHS: Average or above average intelligence  Motivation for treatment/growth  Supportive family/friends    PATIENT IDENTIFIED PROBLEMS: Substance abuse - cocaine use d/o and alcohol use d/o and tobacco use d/o  Depression  Possible o/d on melatonin  Homeless  (Pt wants to get help with housing, coping and however else she can be helped)             DISCHARGE CRITERIA:  Adequate post-discharge living arrangements Improved stabilization in mood, thinking, and/or behavior Motivation to continue treatment in a less acute level of care Verbal commitment to aftercare and medication compliance  PRELIMINARY DISCHARGE PLAN: Attend aftercare/continuing care group Outpatient therapy Placement in alternative living arrangements  PATIENT/FAMILY INVOLVEMENT: This treatment plan has been presented to and reviewed with the patient, Theresa Rios, and/or family member.  The patient and family have been given the opportunity to ask questions and make suggestions.  Victorino December, RN 03/21/2022, 4:52 AM

## 2022-03-21 NOTE — [facility] Suicide Risk Assessment (Cosign Needed)
Suicide Risk Assessment  Admission Assessment    [facility] Admission Suicide Risk Assessment   Nursing information obtained from:  Patient Demographic factors:  Low socioeconomic status, Unemployed Current Mental Status:  NA Loss Factors:  Financial problems / change in socioeconomic status, Decrease in vocational status (recently lost job and place to stay) Historical Factors:  NA Risk Reduction Factors:  Positive social support, Living with another person, especially a relative  Total Time spent with patient: 1.5 hours Principal Problem: MDD (major depressive episode), single episode, severe, no psychosis (HCC) Diagnosis:  Principal Problem:   MDD (major depressive episode), single episode, severe, no psychosis (HCC) Active Problems:   Alcohol use disorder   Anxiety state   Insomnia  History of Present Illness: Theresa Rios is a 56 y.o [place] American female with no prior mental health diagnoses & a history of ETOH abuse who was taken to the [facility] ED by EMS after suspected overdose on Melatonin. She was medically cleared and transferred to this [facility] for treatment and stabilization of her mood.   Continued Clinical Symptoms:  She reports feelings of  hopelessness, worthlessness, helplessness, poor appetite, insomnia as well as feelings of sadness, loneliness & feeling overwhelmed. She reports that she took the overdose after her boyfriend stated that he was going to leave her. She reports that her intent was to die after taking the pills. Current symptoms along with suicide attempt place pt at a high risk for suicide and continous hospitalization is necessary to treat and stabilize mood.  Alcohol Use Disorder Identification Test Final Score (AUDIT): 11 The "Alcohol Use Disorders Identification Test", Guidelines for Use in Primary Care, Second Edition.  World Science writer [facility]). Score between 0-7:  no or low risk or alcohol related problems. Score between 8-15:  moderate risk  of alcohol related problems. Score between 16-19:  high risk of alcohol related problems. Score 20 or above:  warrants further diagnostic evaluation for alcohol dependence and treatment.  CLINICAL FACTORS:   Depression:   Anhedonia Comorbid alcohol abuse/dependence Hopelessness Impulsivity Insomnia Severe  Musculoskeletal: Strength & Muscle Tone: within normal limits Gait & Station: normal Patient leans: N/A  Psychiatric Specialty Exam:  Presentation  General Appearance: Disheveled  Eye Contact:Good  Speech:Clear and Coherent  Speech Volume:Normal  Handedness:Right   Mood and Affect  Mood:Anxious  Affect:Congruent   Thought Process  Thought Processes:Coherent  Descriptions of Associations:Intact  Orientation:Full (Time, Place and Person)  Thought Content:Logical  History of Schizophrenia/Schizoaffective disorder:No data recorded Duration of Psychotic Symptoms:No data recorded Hallucinations:Hallucinations: None  Ideas of Reference:None  Suicidal Thoughts:Suicidal Thoughts: No  Homicidal Thoughts:Homicidal Thoughts: No   Sensorium  Memory:Immediate Good; Remote Poor  Judgment:Fair  Insight:Fair   Executive Functions  Concentration:Fair  Attention Span:Fair  Recall:Fair  Fund of Knowledge:Fair  Language:Fair   Psychomotor Activity  Psychomotor Activity:Psychomotor Activity: Restlessness   Assets  Assets:Communication Skills   Sleep  Sleep:Sleep: Poor    Physical Exam: Physical Exam Constitutional:      Appearance: Normal appearance.  HENT:     Head: Normocephalic.     Nose: Nose normal. No congestion or rhinorrhea.  Eyes:     Pupils: Pupils are equal, round, and reactive to light.  Pulmonary:     Effort: Pulmonary effort is normal.  Musculoskeletal:     Cervical back: Normal range of motion.  Neurological:     Mental Status: She is alert and oriented to person, place, and time.     Sensory: No sensory deficit.  Coordination: Coordination normal.  Psychiatric:        Behavior: Behavior normal.        Thought Content: Thought content normal.    Review of Systems  Constitutional: Negative.  Negative for fever.  HENT: Negative.    Eyes: Negative.   Respiratory: Negative.    Cardiovascular: Negative.   Gastrointestinal: Negative.   Genitourinary: Negative.   Musculoskeletal: Negative.   Skin: Negative.   Neurological: Negative.   Endo/Heme/Allergies: Negative.   Psychiatric/Behavioral:  Positive for depression and substance abuse. Negative for hallucinations, memory loss and suicidal ideas. The patient is nervous/anxious and has insomnia.    Blood pressure (!) 145/90, pulse 89, temperature 98.5 F (36.9 C), temperature source Oral, resp. rate 16, height 5\' 2"  (1.575 m), weight 66.7 kg, last menstrual period 05/17/2014, SpO2 99 %. Body mass index is 26.89 kg/m.   COGNITIVE FEATURES THAT CONTRIBUTE TO RISK:  None    SUICIDE RISK:   Moderate:  Frequent suicidal ideation with limited intensity, and duration, some specificity in terms of plans, no associated intent, good self-control, limited dysphoria/symptomatology, some risk factors present, and identifiable protective factors, including available and accessible social support.  Treatment Plan Summary: Daily contact with patient to assess and evaluate symptoms and progress in treatment and Medication management   Observation Level/Precautions:  15 minute checks  Laboratory:  Labs reviewed   Psychotherapy:  Unit Group sessions  Medications:  See [facility]  Consultations:  To be determined   Discharge Concerns:  Safety, medication compliance, mood stability  Estimated LOS: 5-7 days  Other:  N/A    PLAN Safety and Monitoring: Voluntary admission to inpatient psychiatric unit for safety, stabilization and treatment Daily contact with patient to assess and evaluate symptoms and progress in treatment Patient's case to be discussed in  multi-disciplinary team meeting Observation Level : q15 minute checks Vital signs: q12 hours Precautions: safety Monitor for signs of withdrawal Abstinence from substances encouraged  SW to look into options for outpatient SA treatment at discharge    Long Term Goal(s): Improvement in symptoms so as ready for discharge   Short Term Goals: Ability to identify changes in lifestyle to reduce recurrence of condition will improve, Ability to disclose and discuss suicidal ideas, Ability to demonstrate self-control will improve, Ability to identify and develop effective coping behaviors will improve, Ability to maintain clinical measurements within normal limits will improve, and Ability to identify triggers associated with substance abuse/mental health issues will improve   Diagnoses Principal Problem:   MDD (major depressive episode), single episode, severe, no psychosis (HCC) Active Problems:   Alcohol use disorder   Anxiety state   Insomnia   Medications -Start Ativan detox protocol for alcohol use -Start Hydroxyzine 25 mg TID PRN for anxiety -Start Oral thiamine and MVI replacement as per MAR -Start Gabapentin 100 mg TID for alcohol use d/o/anxiety -Start Zoloft 50 mg daily for depression -Start Nicotine patch 14 mg daily for nicotine dependence   Other PRNS -Continue Tylenol 650 mg every 6 hours PRN for mild pain -Continue Maalox 30 mg every 4 hrs PRN for indigestion -Continue Imodium 2-4 mg as needed for diarrhea -Continue Milk of Magnesia as needed every 6 hrs for constipation -Continue Zofran disintegrating tabs every 6 hrs PRN for nausea    Labs Reviewed: Repeat UA ordered due to large amounts of blood in urine and moderate leukocytes. Lipid panel, TSH & hemoglobin A1C ordered. Repeat EKG ordered for prolonged QT of 499.   Discharge Planning: Social work  and case management to assist with discharge planning and identification of hospital follow-up needs prior to  discharge Estimated LOS: 5-7 days Discharge Concerns: Need to establish a safety plan; Medication compliance and effectiveness Discharge Goals: Return home with outpatient referrals for mental health follow-up including medication management/psychotherapy  I certify that inpatient services furnished can reasonably be expected to improve the patient's condition.   Starleen Blue, NP 03/21/2022, 5:14 PM

## 2022-03-21 NOTE — Progress Notes (Signed)
   03/21/22 0547  Sleep  Number of Hours 1 (late admission)

## 2022-03-21 NOTE — Progress Notes (Signed)
[facility] Group Notes:  (Nursing/MHT/Case Management/Adjunct)  Date:  03/21/2022  Time:  2000  Type of Therapy:   wrap up group  Participation Level:  Active  Participation Quality:  Appropriate, Attentive, Sharing, and Supportive  Affect:  Appropriate  Cognitive:  Alert  Insight:  Improving  Engagement in Group:  Engaged  Modes of Intervention:  Clarification, Education, and Support  Summary of Progress/Problems: Positive thinking and positive change were discussed.   Marcille Buffy 03/21/2022, 9:59 PM

## 2022-03-21 NOTE — H&P (Signed)
Psychiatric Admission Assessment Adult  Patient Identification: Theresa Rios MRN:  [id number] Date of Evaluation:  03/21/2022 Chief Complaint:  MDD (major depressive disorder) [F32.9] Principal Diagnosis: MDD (major depressive episode), single episode, severe, no psychosis (HCC) Diagnosis:  Principal Problem:   MDD (major depressive episode), single episode, severe, no psychosis (HCC) Active Problems:   Alcohol use disorder   Anxiety state   Insomnia  History of Present Illness: Theresa Rios is a 56 y.o [place] American female with no prior mental health diagnoses & a history of ETOH abuse who was taken to the [facility] ED by EMS after suspected overdose on Melatonin. She was medically cleared and transferred to this [facility] for treatment and stabilization of her mood.   On assessment, pt reports depressive symptoms "for a while now", which worsened over the past for  couple of weeks. She reports feelings of  hopelessness, worthlessness, helplessness, poor appetite, insomnia as well as feelings of sadness, loneliness & feeling overwhelmed. She reports that she took the overdose after her boyfriend stated that he was going to leave her. She reports that her intent was to die after taking the pills. She reports that the pills consisted of sleep aides that she purchased from [employer]", and is not sure what ingredients were in them. `  Past Psychiatric Hx: Patient denies any prior mental health diagnoses, and denies any behavioral health related hospitalizations prior to this one. She denies any suicide attempts in the past, but as per chart review, collateral information obtained from her boyfriend while she was at the [facility] suggests that she has a history of ingesting substances whenever BF threatens to leave. Pt does not collaborate this information. She denies any history self injurious behaviors, denies any history of physical/emotional or sexual abuse.   Substance use history:  Patient  reports a history of ETOH use which started 10 yrs ago. She denies any ETOH related blackouts, but states that she  had episodes of "shaking. As per chart review, pt has a history of visiting the ER for seizures, and while at the [facility], she was observed to have jerky movements of her extremities with incontinence. She reports that she drinks 6 bottles of beer nightly. As per collateral information from BF obtained by the ER, pt drinks 12 beers nightly. She reports a history of cocaine use twice monthly, and states that she uses $10 worth each time that she uses. She reports that she smokes a pack of cigarettes daily. Nicotine patch ordered. She denies any other substance abuse. She reports that she is currently not interested in substance abuse rehab.  Past psychiatric medication history: Pt denies any past trials of psychotropic medications.  Family history:  Patient denies any history of mental illness in her family. She reports that her parents had 8 children, but only 3 including herself are alive.  Past Medical History: Pt denies any current medical conditions  Prior Surgeries: Denies Head trauma, LOC, concussions, seizures: Denies a history of head trauma, but there is a history of ER visits for seizure related activity. Pt denies that she takes any medications for seizures. Allergies: Denies LMP: unable to recall Contraception: none PCP: No PCP, but would like referral to a PCP at discharge  Additional Social History: Pt reports that she is currently homeless, and was living with her boyfriend. She reports that she plans to get a motel room at discharge, and plans to go to "[employer]" to get a job and build up income  so that she can get a one bedroom apartment. She reports that she does not have any children, but has a sister who is supportive.   Current Presentation: During this encounter, pt presents with a euphoric & anxious mood, & affect is congruent. She talks rapidly with some  pressured speech and is visibly restless. She is oriented to person, place, time & situation, and knows the current president.  Her attention to personal hygiene and grooming is poor, and the need to tend to personal hygiene and grooming is reiterated. Eye contact is good, speech is clear & coherent. Thought contents are organized and logical, and pt currently denies SI/HI/AVH. She denies paranoia, and there is no evidence of delusional thinking.  Medication plan: Medications discussed with pt, and pt educated on the rationales, benefits and possible side effects of Zoloft for the management of her depressive symptoms and anxiety. Pt agreeable to trial of Zoloft. Zoloft 50 mg daily ordered, Hydroxyzine 25 mg TID PRN ordered for anxiety, Trazodone 50 mg PRN nightly for insomnia ordered. Gabapentin 100 mg TID started for alcohol use d/o and questionable seizures and anxiety. Ativan alcohol detox protocol ordered. Will continue to monitor.  Total Time spent with patient: 1.5 hours  Associated Signs/Symptoms: Depression Symptoms:   depressed mood, anhedonia, insomnia, feelings of worthlessness/guilt, difficulty concentrating, hopelessness, suicidal attempt, anxiety, Duration of Depression Symptoms: No data recorded (Hypo) Manic Symptoms:   Impulsivity, Anxiety Symptoms:   Excessive Worry, Psychotic Symptoms:   N/a PTSD Symptoms: NA   Associated Signs/Symptoms: Depression Symptoms:  depressed mood, insomnia, feelings of worthlessness/guilt, difficulty concentrating, suicidal attempt, anxiety, Duration of Depression Symptoms: At least 2 weeks (Hypo) Manic Symptoms:  Impulsivity, Anxiety Symptoms:  Excessive Worry, Psychotic Symptoms:   n/a PTSD Symptoms: NA Total Time spent with patient: 1.5 hours  Past Psychiatric History: ETOH abuse  Is the patient at risk to self? Yes.    Has the patient been a risk to self in the past 6 months? Yes.    Has the patient been a risk to self  within the distant past? No.  Is the patient a risk to others? No.  Has the patient been a risk to others in the past 6 months? No.  Has the patient been a risk to others within the distant past? No.   Prior Inpatient Therapy:   Prior Outpatient Therapy:    Alcohol Screening: 1. How often do you have a drink containing alcohol?: 4 or more times a week 2. How many drinks containing alcohol do you have on a typical day when you are drinking?: 5 or 6 3. How often do you have six or more drinks on one occasion?: Less than monthly AUDIT-C Score: 7 4. How often during the last year have you found that you were not able to stop drinking once you had started?: Never 5. How often during the last year have you failed to do what was normally expected from you because of drinking?: Never 6. How often during the last year have you needed a first drink in the morning to get yourself going after a heavy drinking session?: Never 7. How often during the last year have you had a feeling of guilt of remorse after drinking?: Never 8. How often during the last year have you been unable to remember what happened the night before because you had been drinking?: Never 9. Have you or someone else been injured as a result of your drinking?: No 10. Has a relative or friend  or a doctor or another health worker been concerned about your drinking or suggested you cut down?: Yes, during the last year Alcohol Use Disorder Identification Test Final Score (AUDIT): 11 Alcohol Brief Interventions/Follow-up: Patient Refused Substance Abuse History in the last 12 months:  Yes.   Consequences of Substance Abuse: Medical Consequences:  Questionable seizure activity Previous Psychotropic Medications: No  Psychological Evaluations: No  Past Medical History:  Past Medical History:  Diagnosis Date   Seizures (HCC)    History reviewed. No pertinent surgical history. Family History: History reviewed. No pertinent family  history. Family Psychiatric  History: none reported Tobacco Screening:   Social History:  Social History   Substance and Sexual Activity  Alcohol Use Yes   Comment: 4-5 beers a day most days     Social History   Substance and Sexual Activity  Drug Use Yes   Types: Cocaine, Marijuana   Comment: last marijuana use: 2 mos; last cocaine use: 3 days ago    Additional Social History: Marital status: Separated Separated, when?: 6 months ago What types of issues is patient dealing with in the relationship?: "He moved back to his mother's home" Are you sexually active?: No What is your sexual orientation?: Heterosexual Has your sexual activity been affected by drugs, alcohol, medication, or emotional stress?: No Does patient have children?: No     Allergies:  No Known Allergies Lab Results: No results found for this or any previous visit (from the past 48 hour(s)).  Blood Alcohol level:  Lab Results  Component Value Date   ETH 178 (H) 03/19/2022   ETH 202 (H) 03/14/2022   Metabolic Disorder Labs:  No results found for: "HGBA1C", "MPG" No results found for: "PROLACTIN" No results found for: "CHOL", "TRIG", "HDL", "CHOLHDL", "VLDL", "LDLCALC"  Current Medications: Current Facility-Administered Medications  Medication Dose Route Frequency Provider Last Rate Last Admin   acetaminophen (TYLENOL) tablet 650 mg  650 mg Oral Q6H PRN Eligha Bridegroom, NP       alum & mag hydroxide-simeth (MAALOX/MYLANTA) 200-200-20 MG/5ML suspension 30 mL  30 mL Oral Q4H PRN Eligha Bridegroom, NP       gabapentin (NEURONTIN) capsule 100 mg  100 mg Oral TID Starleen Blue, NP   100 mg at 03/21/22 1707   hydrOXYzine (ATARAX) tablet 25 mg  25 mg Oral TID PRN Eligha Bridegroom, NP       loperamide (IMODIUM) capsule 2-4 mg  2-4 mg Oral PRN Starleen Blue, NP       LORazepam (ATIVAN) tablet 1 mg  1 mg Oral Q6H PRN Starleen Blue, NP       LORazepam (ATIVAN) tablet 1 mg  1 mg Oral QID Starleen Blue, NP   1 mg  at 03/21/22 1707   Followed by   Melene Muller ON 03/22/2022] LORazepam (ATIVAN) tablet 1 mg  1 mg Oral TID Starleen Blue, NP       Followed by   Melene Muller ON 03/23/2022] LORazepam (ATIVAN) tablet 1 mg  1 mg Oral BID Starleen Blue, NP       Followed by   Melene Muller ON 03/24/2022] LORazepam (ATIVAN) tablet 1 mg  1 mg Oral Daily Niva Murren, NP       magnesium hydroxide (MILK OF MAGNESIA) suspension 30 mL  30 mL Oral Daily PRN Eligha Bridegroom, NP       multivitamin with minerals tablet 1 tablet  1 tablet Oral Daily Starleen Blue, NP   1 tablet at 03/21/22 1311   nicotine (NICODERM CQ -  dosed in mg/24 hours) patch 14 mg  14 mg Transdermal Daily Onuoha, Chinwendu V, NP   14 mg at 03/21/22 1310   ondansetron (ZOFRAN-ODT) disintegrating tablet 4 mg  4 mg Oral Q6H PRN Starleen BlueNkwenti, Kree Rafter, NP       [START ON 03/22/2022] pneumococcal 20-valent conjugate vaccine (PREVNAR 20) injection 0.5 mL  0.5 mL Intramuscular Tomorrow-1000 Onuoha, Chinwendu V, NP       sertraline (ZOLOFT) tablet 50 mg  50 mg Oral Daily Emiliya Chretien, NP   50 mg at 03/21/22 1311   [START ON 03/22/2022] thiamine tablet 100 mg  100 mg Oral Daily Philopater Mucha, Tyler Aasoris, NP       PTA Medications: Medications Prior to Admission  Medication Sig Dispense Refill Last Dose   HYDROcodone-acetaminophen (NORCO) 5-325 MG tablet Take 1 tablet by mouth every 6 (six) hours as needed for severe pain. (Patient not taking: Reported on 03/19/2022) 10 tablet 0    ibuprofen (ADVIL) 800 MG tablet Take 800 mg by mouth 3 (three) times daily as needed for moderate pain.      Musculoskeletal: Strength & Muscle Tone: within normal limits Gait & Station: normal Patient leans: N/A  Psychiatric Specialty Exam:  Presentation  General Appearance: Disheveled  Eye Contact:Good  Speech:Clear and Coherent  Speech Volume:Normal  Handedness:Right   Mood and Affect  Mood:Anxious  Affect:Congruent   Thought Process  Thought Processes:Coherent  Duration of Psychotic  Symptoms: No data recorded Past Diagnosis of Schizophrenia or Psychoactive disorder: No data recorded Descriptions of Associations:Intact  Orientation:Full (Time, Place and Person)  Thought Content:Logical  Hallucinations:Hallucinations: None  Ideas of Reference:None  Suicidal Thoughts:Suicidal Thoughts: No  Homicidal Thoughts:Homicidal Thoughts: No   Sensorium  Memory:Immediate Good; Remote Poor  Judgment:Fair  Insight:Fair   Executive Functions  Concentration:Fair  Attention Span:Fair  Recall:Fair  Fund of Knowledge:Fair  Language:Fair   Psychomotor Activity  Psychomotor Activity:Psychomotor Activity: Restlessness   Assets  Assets:Communication Skills   Sleep  Sleep:Sleep: Poor    Physical Exam: Physical Exam Constitutional:      Appearance: Normal appearance.  HENT:     Head: Normocephalic.     Nose: Nose normal. No congestion or rhinorrhea.  Eyes:     Pupils: Pupils are equal, round, and reactive to light.  Musculoskeletal:     Cervical back: Normal range of motion.  Neurological:     Mental Status: She is alert and oriented to person, place, and time.     Sensory: No sensory deficit.  Psychiatric:        Behavior: Behavior normal.        Thought Content: Thought content normal.    Review of Systems  Constitutional: Negative.   HENT: Negative.    Eyes: Negative.   Respiratory: Negative.    Cardiovascular: Negative.   Gastrointestinal: Negative.   Genitourinary: Negative.   Musculoskeletal: Negative.   Skin: Negative.   Neurological: Negative.   Endo/Heme/Allergies: Negative.   Psychiatric/Behavioral:  Positive for depression and substance abuse. Negative for hallucinations, memory loss and suicidal ideas. The patient is nervous/anxious and has insomnia.    Blood pressure (!) 145/90, pulse 89, temperature 98.5 F (36.9 C), temperature source Oral, resp. rate 16, height 5\' 2"  (1.575 m), weight 66.7 kg, last menstrual period  05/17/2014, SpO2 99 %. Body mass index is 26.89 kg/m.  Treatment Plan Summary: Daily contact with patient to assess and evaluate symptoms and progress in treatment and Medication management  Observation Level/Precautions:  15 minute checks  Laboratory:  Labs reviewed  Psychotherapy:  Unit Group sessions  Medications:  See [facility]  Consultations:  To be determined   Discharge Concerns:  Safety, medication compliance, mood stability  Estimated LOS: 5-7 days  Other:  N/A   PLAN Safety and Monitoring: Voluntary admission to inpatient psychiatric unit for safety, stabilization and treatment Daily contact with patient to assess and evaluate symptoms and progress in treatment Patient's case to be discussed in multi-disciplinary team meeting Observation Level : q15 minute checks Vital signs: q12 hours Precautions: safety Monitor for signs of withdrawal Abstinence from substances encouraged  SW to look into options for outpatient SA treatment at discharge   Long Term Goal(s): Improvement in symptoms so as ready for discharge  Short Term Goals: Ability to identify changes in lifestyle to reduce recurrence of condition will improve, Ability to disclose and discuss suicidal ideas, Ability to demonstrate self-control will improve, Ability to identify and develop effective coping behaviors will improve, Ability to maintain clinical measurements within normal limits will improve, and Ability to identify triggers associated with substance abuse/mental health issues will improve  Diagnoses Principal Problem:   MDD (major depressive episode), single episode, severe, no psychosis (HCC) Active Problems:   Alcohol use disorder   Anxiety state   Insomnia  Medications -Start Ativan detox protocol for alcohol use -Start Hydroxyzine 25 mg TID PRN for anxiety -Start Oral thiamine and MVI replacement as per MAR -Start Gabapentin 100 mg TID for alcohol use d/o/anxiety -Start Zoloft 50 mg daily for  depression -Start Nicotine patch 14 mg daily for nicotine dependence  Other PRNS -Continue Tylenol 650 mg every 6 hours PRN for mild pain -Continue Maalox 30 mg every 4 hrs PRN for indigestion -Continue Imodium 2-4 mg as needed for diarrhea -Continue Milk of Magnesia as needed every 6 hrs for constipation -Continue Zofran disintegrating tabs every 6 hrs PRN for nausea   Labs Reviewed: Repeat UA ordered due to large amounts of blood in urine and moderate leukocytes. Lipid panel, TSH & hemoglobin A1C ordered. Repeat EKG ordered for prolonged QT of 499.  Discharge Planning: Social work and case management to assist with discharge planning and identification of hospital follow-up needs prior to discharge Estimated LOS: 5-7 days Discharge Concerns: Need to establish a safety plan; Medication compliance and effectiveness Discharge Goals: Return home with outpatient referrals for mental health follow-up including medication management/psychotherapy  I certify that inpatient services furnished can reasonably be expected to improve the patient's condition.    Starleen Blue, NP 7/13/20235:10 PM

## 2022-03-21 NOTE — Progress Notes (Signed)
Patient ID: Theresa Rios, female   DOB: 26-Nov-1965, 56 y.o.   MRN: [id number]  D: Pt here IVC from [facility]. Pt denies SI/HI/AVH and pain at this time. Pt is tearful. She recently lost her job and her place to stay. Her husband is living with his mother and she was living with a friend until recently. Pt is newly homeless. Pt wants assistance with housing and coping. Pt drinks 4-5 beers most days of the week. Pt uses cocaine and marijuana. Last marijuana use: 2 months ago. Last cocaine use: 3 days ago. Pt states that her husband has told her that she drinks too much. Pt also smokes half a pack of cigarettes daily. Pt states she has never withdrawn from alcohol. No symptoms at present.   Pt denies anxiety and is non-specific about depression. Pt is worried about her livelihood and how she will live. Pt does not have a primary doctor and needs to see an eye doctor for impaired vision. Can see well enough to ambulate but has trouble reading words on a page unless paper is very close or writing is enlarged. Pt would like referrals for a primary doctor. Pt denies history of physical, verbal or sexual abuse. This is patient's first inpatient admission.  A: Pt was offered support and encouragement. Pt is cooperative during assessment. VS assessed and admission paperwork signed. Belongings searched and contraband items placed in locker. Non-invasive skin search completed: pt has healed scar on right upper back. Pt offered food and drink and drink accepted. Pt introduced to unit milieu by nursing staff. Q 15 minute checks were started for safety.   R: Pt in room. Pt safety maintained on unit.

## 2022-03-21 NOTE — Progress Notes (Signed)
   03/21/22 0400  Psych Admission Type (Psych Patients Only)  Admission Status Involuntary  Psychosocial Assessment  Patient Complaints Depression;Crying spells;Sadness;Worrying  Eye Contact Fair  Facial Expression Anxious  Affect Anxious  Speech Logical/coherent  Interaction Assertive  Motor Activity Other (Comment) (wnl)  Appearance/Hygiene Unremarkable;In hospital gown  Behavior Characteristics Cooperative;Appropriate to situation;Anxious  Mood Depressed;Pleasant  Thought Process  Coherency WDL  Content WDL  Delusions None reported or observed  Perception WDL  Hallucination None reported or observed  Judgment Impaired  Confusion None  Danger to Self  Current suicidal ideation? Denies  Danger to Others  Danger to Others None reported or observed

## 2022-03-21 NOTE — Progress Notes (Signed)
Dar Note: Patient presents with anxious mood and affect.  Denies suicidal thoughts, auditory and visual hallucinations.  Medications given as prescribed.  Routine safety checks maintained.  Patient visible in milieu interacting well with staff and peers.   Support and encouragement offered as needed.  Patient is safe on and off the unit.

## 2022-03-21 NOTE — Progress Notes (Signed)
   03/21/22 2000  Psych Admission Type (Psych Patients Only)  Admission Status Involuntary  Psychosocial Assessment  Patient Complaints Substance abuse;Depression  Eye Contact Fair  Facial Expression Anxious  Affect Anxious  Speech Logical/coherent  Interaction Assertive  Motor Activity Slow  Appearance/Hygiene Unremarkable  Behavior Characteristics Cooperative  Mood Depressed  Aggressive Behavior  Effect No apparent injury  Thought Process  Coherency WDL  Content WDL  Delusions WDL  Perception WDL  Hallucination None reported or observed  Judgment Impaired  Confusion None  Danger to Self  Current suicidal ideation? Denies  Danger to Others  Danger to Others None reported or observed

## 2022-03-22 ENCOUNTER — Encounter (HOSPITAL_COMMUNITY): Payer: Self-pay

## 2022-03-22 LAB — LIPID PANEL
Cholesterol: 223 mg/dL — ABNORMAL HIGH (ref 0–200)
HDL: 91 mg/dL (ref >40)
LDL Cholesterol: 112 mg/dL — ABNORMAL HIGH (ref 0–99)
Total CHOL/HDL Ratio: 2.5 RATIO
Triglycerides: 102 mg/dL (ref <150)
VLDL: 20 mg/dL (ref 0–40)

## 2022-03-22 LAB — URINALYSIS, COMPLETE (UACMP) WITH MICROSCOPIC
Bilirubin Urine: NEGATIVE
Glucose, UA: NEGATIVE mg/dL
Hgb urine dipstick: NEGATIVE
Ketones, ur: NEGATIVE mg/dL
Nitrite: NEGATIVE
Protein, ur: NEGATIVE mg/dL
Specific Gravity, Urine: 1.009 (ref 1.005–1.030)
pH: 6 (ref 5.0–8.0)

## 2022-03-22 LAB — HEMOGLOBIN A1C
Hgb A1c MFr Bld: 5.7 % — ABNORMAL HIGH (ref 4.8–5.6)
Mean Plasma Glucose: 116.89 mg/dL

## 2022-03-22 LAB — TSH: TSH: 1.567 u[IU]/mL (ref 0.350–4.500)

## 2022-03-22 MED ORDER — TRAZODONE HCL 50 MG PO TABS
50.0000 mg | ORAL_TABLET | Freq: Every evening | ORAL | Status: DC | PRN
  Administered 2022-03-22 – 2022-03-24 (×3): 50 mg via ORAL
  Filled 2022-03-22 (×3): qty 1

## 2022-03-22 MED ORDER — GABAPENTIN 100 MG PO CAPS
200.0000 mg | ORAL_CAPSULE | Freq: Three times a day (TID) | ORAL | Status: DC
  Administered 2022-03-22 – 2022-03-25 (×9): 200 mg via ORAL
  Filled 2022-03-22 (×17): qty 2

## 2022-03-22 MED ORDER — NITROFURANTOIN MONOHYD MACRO 100 MG PO CAPS
100.0000 mg | ORAL_CAPSULE | Freq: Two times a day (BID) | ORAL | Status: DC
  Administered 2022-03-22 – 2022-03-27 (×10): 100 mg via ORAL
  Filled 2022-03-22 (×15): qty 1

## 2022-03-22 MED ORDER — SERTRALINE HCL 50 MG PO TABS
75.0000 mg | ORAL_TABLET | Freq: Every day | ORAL | Status: DC
  Administered 2022-03-23 – 2022-03-24 (×2): 75 mg via ORAL
  Filled 2022-03-22 (×5): qty 1

## 2022-03-22 NOTE — Group Note (Signed)
Recreation Therapy Group Note   Group Topic:Problem Solving  Group Date: 03/22/2022 Start Time: 0935 End Time: 0950 Facilitators: Caroll Rancher, LRT,CTRS Location: [address] Dayroom   Goal Area(s) Addresses:  Patient will effectively work with peer towards shared goal.  Patient will identify skills used to make activity successful.  Patient will identify how skills used during activity can be used to reach post d/c goals.    Group Description:  Landing Pad. In teams of 3-5, patients were given 12 plastic drinking straws and an equal length of masking tape. Using the materials provided, patients were asked to build a landing pad to catch a golf ball dropped from approximately 5 feet in the air. All materials were required to be used by the team in their design. LRT facilitated post-activity discussion.   Affect/Mood: N/A   Participation Level: Did not attend   Participation Quality: Independent   Behavior: Appropriate   Speech/Thought Process: Focused   Insight: Good   Judgement: Good   Modes of Intervention: STEM Activity   Patient Response to Interventions:  Engaged   Education Outcome:  Acknowledges education and In group clarification offered    Clinical Observations/Individualized Feedback:     Plan: Continue to engage patient in RT group sessions 2-3x/week.   Caroll Rancher, Antonietta Jewel  03/22/2022 12:54 PM

## 2022-03-22 NOTE — Progress Notes (Signed)
   03/22/22 0530  Sleep  Number of Hours 6.75

## 2022-03-22 NOTE — Progress Notes (Signed)
[facility]  03/22/2022 4:07 PM Otilio Connorsna L Retzloff  MRN:  [id number]  Reason For Admission: Zykia T. Viviann Spareownsend is a 56 y.o [place] female with no prior mental health diagnoses & a history of ETOH abuse who was [facility] by [facility] after suspected overdose on Melatonin. [facility] and [facility] of her mood.   24 hr Chart Review: Pt's chart reviewed, his case discussed with his treatment team. Pt slept for  6.7 last night as per nursing flow sheets. She has been compliant with scheduled medications, and has been participating in unit activities over the past 24 hrs. As per nursing documentation, she has been cooperative, but anxious over the past 24 hrs. Nursing staff has been asked to check V/S on patient.   Today's patient assessment note: On assessment today, pt presents with an anxious mood & affect is congruent. Her attention to personal hygiene and grooming is fair, eye contact is good, speech is clear & coherent. Thought contents are organized and logical, and pt currently denies SI/HI/AVH or paranoia. There is no evidence of delusional thoughts.    Pt reports that her sleep quality last night was good, and reports that her appetite is good. She denies any medication related side effects. Baseline urinalysis obtained on 7/11 showed moderate leukocytes, large amounts of blood, rare bacteria and was cloudy in color. A repeat UA was ordered, and pt denied symptoms yesterday. Today, she is symptomatic and reporting itching in her vaginal area. Macrobid 100 mg BID is ordered x 7 days for UTI. Neurontin increased to 200 mg TID for anxiety, and Zoloft increased to 75 mg daily starting tomorrow morning for depression/anxiety. Will continue other meds as listed below. Pt is requesting to be checked for STDs. Will order GC Chlamydia, HIV & RPR panel. Pt agreeable to these tests..  Principal Problem: MDD (major depressive  episode), single episode, severe, no psychosis (HCC) Diagnosis: Principal Problem:   MDD (major depressive episode), single episode, severe, no psychosis (HCC) Active Problems:   Alcohol use disorder   Anxiety state   Insomnia  Total Time spent with patient: 30 minutes  Past Psychiatric History: As above  Past Medical History:  Past Medical History:  Diagnosis Date   Seizures (HCC)    History reviewed. No pertinent surgical history. Family History: History reviewed. No pertinent family history. Family Psychiatric  History: none reported Social History:  Social History   Substance and Sexual Activity  Alcohol Use Yes   Comment: 4-5 beers a day most days     Social History   Substance and Sexual Activity  Drug Use Yes   Types: Cocaine, Marijuana   Comment: last marijuana use: 2 mos; last cocaine use: 3 days ago    Social History   Socioeconomic History   Marital status: Media plannerDomestic Partner    Spouse name: Not on file   Number of children: 0   Years of education: Not on file   Highest education level: Not on file  Occupational History   Not on file  Tobacco Use   Smoking status: Every Day    Packs/day: 0.50    Types: Cigarettes   Smokeless tobacco: Never  Vaping Use   Vaping Use: Never used  Substance and Sexual Activity   Alcohol use: Yes    Comment: 4-5 beers a day most days   Drug use: Yes    Types: Cocaine, Marijuana  Comment: last marijuana use: 2 mos; last cocaine use: 3 days ago   Sexual activity: Not on file  Other Topics Concern   Not on file  Social History Narrative   Pt lives in [place] with a friend. Her husband is living with his mother. Pt recently lost her job and her place to stay.   Social Determinants of Health   Financial Resource Strain: Not on file  Food Insecurity: Not on file  Transportation Needs: Not on file  Physical Activity: Not on file  Stress: Not on file  Social Connections: Not on file   Additional Social History:    Sleep: Good  Appetite:  Good  Current Medications: Current Facility-Administered Medications  Medication Dose Route Frequency Provider Last Rate Last Admin   acetaminophen (TYLENOL) tablet 650 mg  650 mg Oral Q6H PRN Eligha Bridegroom, NP   650 mg at 03/22/22 0800   alum & mag hydroxide-simeth (MAALOX/MYLANTA) 200-200-20 MG/5ML suspension 30 mL  30 mL Oral Q4H PRN Eligha Bridegroom, NP       gabapentin (NEURONTIN) capsule 200 mg  200 mg Oral TID Starleen Blue, NP       hydrOXYzine (ATARAX) tablet 25 mg  25 mg Oral TID PRN Eligha Bridegroom, NP   25 mg at 03/21/22 2132   loperamide (IMODIUM) capsule 2-4 mg  2-4 mg Oral PRN Starleen Blue, NP       LORazepam (ATIVAN) tablet 1 mg  1 mg Oral Q6H PRN Starleen Blue, NP       LORazepam (ATIVAN) tablet 1 mg  1 mg Oral TID Starleen Blue, NP   1 mg at 03/22/22 1220   Followed by   Melene Muller ON 03/23/2022] LORazepam (ATIVAN) tablet 1 mg  1 mg Oral BID Starleen Blue, NP       Followed by   Melene Muller ON 03/24/2022] LORazepam (ATIVAN) tablet 1 mg  1 mg Oral Daily Jesika Men, NP       magnesium hydroxide (MILK OF MAGNESIA) suspension 30 mL  30 mL Oral Daily PRN Eligha Bridegroom, NP       multivitamin with minerals tablet 1 tablet  1 tablet Oral Daily Starleen Blue, NP   1 tablet at 03/22/22 0800   nicotine (NICODERM CQ - dosed in mg/24 hours) patch 14 mg  14 mg Transdermal Daily Onuoha, Chinwendu V, NP   14 mg at 03/22/22 0802   nitrofurantoin (macrocrystal-monohydrate) (MACROBID) capsule 100 mg  100 mg Oral Q12H Edin Kon, NP       ondansetron (ZOFRAN-ODT) disintegrating tablet 4 mg  4 mg Oral Q6H PRN Starleen Blue, NP       [START ON 03/23/2022] sertraline (ZOLOFT) tablet 75 mg  75 mg Oral Daily Brydon Spahr, NP       thiamine tablet 100 mg  100 mg Oral Daily Jacqui Headen, NP   100 mg at 03/22/22 0800   traZODone (DESYREL) tablet 50 mg  50 mg Oral QHS PRN Starleen Blue, NP        Lab Results:  Results for orders placed or performed during  the hospital encounter of 03/21/22 (from the past 48 hour(s))  Lipid panel     Status: Abnormal   Collection Time: 03/22/22  6:48 AM  Result Value Ref Range   Cholesterol 223 (H) 0 - 200 mg/dL   Triglycerides 573 <220 mg/dL   HDL 91 >25 mg/dL   Total CHOL/HDL Ratio 2.5 RATIO   VLDL 20 0 - 40 mg/dL   LDL Cholesterol 427 (  H) 0 - 99 mg/dL    Comment:        Total Cholesterol/HDL:CHD Risk Coronary Heart Disease Risk Table                     Men   Women  1/2 Average Risk   3.4   3.3  Average Risk       5.0   4.4  2 X Average Risk   9.6   7.1  3 X Average Risk  23.4   11.0        Use the calculated Patient Ratio above and the CHD Risk Table to determine the patient's CHD Risk.        ATP III CLASSIFICATION (LDL):  <100     mg/dL   Optimal  622-633  mg/dL   Near or Above                    Optimal  130-159  mg/dL   Borderline  354-562  mg/dL   High  >563     mg/dL   Very High Performed at [facility], [address], [place], [place] [address]   TSH     Status: None   Collection Time: 03/22/22  6:48 AM  Result Value Ref Range   TSH 1.567 0.350 - 4.500 uIU/mL    Comment: Performed by a 3rd Generation assay with a functional sensitivity of <=0.01 uIU/mL. Performed at [facility], [address], [place], [place] [address]   Hemoglobin A1c     Status: Abnormal   Collection Time: 03/22/22  6:48 AM  Result Value Ref Range   Hgb A1c MFr Bld 5.7 (H) 4.8 - 5.6 %    Comment: (NOTE) Pre diabetes:          5.7%-6.4%  Diabetes:              >6.4%  Glycemic control for   <7.0% adults with diabetes    Mean Plasma Glucose 116.89 mg/dL    Comment: Performed at [facility], [address] [place], [place] [address]    Blood Alcohol level:  Lab Results  Component Value Date   ETH 178 (H) 03/19/2022   ETH 202 (H) 03/14/2022    Metabolic Disorder Labs: Lab Results  Component Value Date   HGBA1C 5.7 (H) 03/22/2022   MPG 116.89  03/22/2022   No results found for: "PROLACTIN" Lab Results  Component Value Date   CHOL 223 (H) 03/22/2022   TRIG 102 03/22/2022   HDL 91 03/22/2022   CHOLHDL 2.5 03/22/2022   VLDL 20 03/22/2022   LDLCALC 112 (H) 03/22/2022    Physical Findings: AIMS:  , ,  ,  ,    CIWA:  CIWA-Ar Total: 0 COWS:     Musculoskeletal: Strength & Muscle Tone: within normal limits Gait & Station: normal Patient leans: N/A  Psychiatric Specialty Exam:  Presentation  General Appearance: Appropriate for Environment; Fairly Groomed  Eye Contact:Good  Speech:Clear and Coherent  Speech Volume:Normal  Handedness:Right   Mood and Affect  Mood:Euthymic  Affect:Congruent   Thought Process  Thought Processes:Coherent  Descriptions of Associations:Intact  Orientation:Full (Time, Place and Person)  Thought Content:Logical  History of Schizophrenia/Schizoaffective disorder:No data recorded Duration of Psychotic Symptoms:No data recorded Hallucinations:Hallucinations: None  Ideas of Reference:None  Suicidal Thoughts:Suicidal Thoughts: No  Homicidal Thoughts:Homicidal Thoughts: No   Sensorium  Memory:Immediate Good  Judgment:Good  Insight:Good   Art therapist  Concentration:Good  Attention Span:Good  Recall:Good  Fund of Knowledge:Good  Language:Good  Psychomotor Activity  Psychomotor Activity:Psychomotor Activity: Normal  Assets  Assets:Communication Skills  Sleep  Sleep:Sleep: Good  Physical Exam: Physical Exam Constitutional:      Appearance: Normal appearance.  HENT:     Head: Normocephalic.     Nose: Nose normal. No congestion or rhinorrhea.  Eyes:     Pupils: Pupils are equal, round, and reactive to light.  Pulmonary:     Effort: Pulmonary effort is normal.  Musculoskeletal:     Cervical back: Normal range of motion.  Neurological:     Mental Status: She is alert and oriented to person, place, and time.     Sensory: No sensory deficit.      Coordination: Coordination normal.     Deep Tendon Reflexes: Reflexes normal.  Psychiatric:        Behavior: Behavior normal.    Review of Systems  Constitutional: Negative.  Negative for fever.  HENT: Negative.    Eyes: Negative.   Respiratory: Negative.    Cardiovascular: Negative.   Gastrointestinal: Negative.   Genitourinary: Negative.   Musculoskeletal: Negative.   Skin: Negative.   Neurological: Negative.   Endo/Heme/Allergies: Negative.   Psychiatric/Behavioral:  Positive for depression and substance abuse. Negative for hallucinations, memory loss and suicidal ideas. The patient is nervous/anxious and has insomnia.    Blood pressure (!) 135/94, pulse (!) 102, temperature 98.5 F (36.9 C), temperature source Oral, resp. rate 16, height 5\' 2"  (1.575 m), weight 66.7 kg, last menstrual period 05/17/2014, SpO2 99 %. Body mass index is 26.89 kg/m.  Treatment Plan Summary: Daily contact with patient to assess and evaluate symptoms and progress in treatment and Medication management   Observation Level/Precautions:  15 minute checks  Laboratory:  Labs reviewed   Psychotherapy:  Unit Group sessions  Medications:  See [facility]  Consultations:  To be determined   Discharge Concerns:  Safety, medication compliance, mood stability  Estimated LOS: 5-7 days  Other:  N/A    PLAN Safety and Monitoring: Voluntary admission to inpatient psychiatric unit for safety, stabilization and treatment Daily contact with patient to assess and evaluate symptoms and progress in treatment Patient's case to be discussed in multi-disciplinary team meeting Observation Level : q15 minute checks Vital signs: q12 hours Precautions: safety Monitor for signs of withdrawal Abstinence from substances encouraged  SW to look into options for outpatient SA treatment at discharge    Long Term Goal(s): Improvement in symptoms so as ready for discharge   Short Term Goals: Ability to identify changes in  lifestyle to reduce recurrence of condition will improve, Ability to disclose and discuss suicidal ideas, Ability to demonstrate self-control will improve, Ability to identify and develop effective coping behaviors will improve, Ability to maintain clinical measurements within normal limits will improve, and Ability to identify triggers associated with substance abuse/mental health issues will improve   Diagnoses Principal Problem:   MDD (major depressive episode), single episode, severe, no psychosis (HCC) Active Problems:   Alcohol use disorder   Anxiety state   Insomnia   Medications -Continue Ativan detox protocol for alcohol use -Continue Hydroxyzine 25 mg TID PRN for anxiety -Continue Oral thiamine and MVI replacement as per MAR -Increase Gabapentin to 200 mg TID for alcohol use d/o/anxiety -Increase Zoloft to 75 mg daily for depression starting 7/15 -Continue Nicotine patch 14 mg daily for nicotine dependence -Start Trazodone 50 mg nightly PRN for insomnia   Other PRNS -Continue Tylenol  650 mg every 6 hours PRN for mild pain -Continue Maalox 30 mg every 4 hrs PRN for indigestion -Continue Imodium 2-4 mg as needed for diarrhea -Continue Milk of Magnesia as needed every 6 hrs for constipation -Continue Zofran disintegrating tabs every 6 hrs PRN for nausea    Labs Reviewed: Urine culture ordered due to large amounts of blood in urine and moderate leukocytes with rare bacteria. C/o itching in vaginal area, and started on Macrobid 100 mg BID for UTI. Lipid panel with cholesterol 223, LDL-112. Educated on healthy food choices and exercise. TSH WNL. Repeat EKG ordered for prolonged QT of 499. Hemoglobin A1C 5.7 (borderline diabetes). Educated on fact that will need PCP f/u at discharge.    Discharge Planning: Social work and case management to assist with discharge planning and identification of hospital follow-up needs prior to discharge Estimated LOS: 5-7 days Discharge Concerns:  Need to establish a safety plan; Medication compliance and effectiveness Discharge Goals: Return home with outpatient referrals for mental health follow-up including medication management/psychotherapy  Starleen Blue, NP 03/22/2022, 4:07 PM

## 2022-03-22 NOTE — [facility] Suicide Risk Assessment (Signed)
[facility] INPATIENT:  Family/Significant Other Suicide Prevention Education  Suicide Prevention Education:  Contact Attempts: Alverda Skeans [phone] (Husband) has been identified by the patient as the family member/significant other with whom the patient will be residing, and identified as the person(s) who will aid the patient in the event of a mental health crisis.  With written consent from the patient, two attempts were made to provide suicide prevention education, prior to and/or following the patient's discharge.  We were unsuccessful in providing suicide prevention education.  A suicide education pamphlet was given to the patient to share with family/significant other.  Date and time of first attempt: 03/21/2022 at 3:49pm Date and time of second attempt: 03/22/2022 at 10:47am   There was no voicemail set up on the individuals phone.  CSW was unable to leave a message.    Met[employer] Kung Terren Jandreau 03/22/2022, 10:48 AM

## 2022-03-22 NOTE — Group Note (Signed)
LCSW Group Therapy Note  Group Date: 03/22/2022 Start Time: 1300 End Time: 1400   Type of Therapy and Topic:  Group Therapy - Healthy vs Unhealthy Coping Skills  Participation Level:  Did Not Attend   Description of Group The focus of this group was to determine what unhealthy coping techniques typically are used by group members and what healthy coping techniques would be helpful in coping with various problems. Patients were guided in becoming aware of the differences between healthy and unhealthy coping techniques. Patients were asked to identify 2-3 healthy coping skills they would like to learn to use more effectively.  Therapeutic Goals Patients learned that coping is what human beings do all day long to deal with various situations in their lives Patients defined and discussed healthy vs unhealthy coping techniques Patients identified their preferred coping techniques and identified whether these were healthy or unhealthy Patients determined 2-3 healthy coping skills they would like to become more familiar with and use more often. Patients provided support and ideas to each other   Summary of Patient Progress:  Did not attend    Therapeutic Modalities Cognitive Behavioral Therapy Motivational Interviewing  Aram Beecham, Theresia Majors 03/22/2022  1:48 PM

## 2022-03-22 NOTE — Progress Notes (Signed)
   03/22/22 0900  Psych Admission Type (Psych Patients Only)  Admission Status Involuntary  Psychosocial Assessment  Patient Complaints Substance abuse;Anxiety;Depression  Eye Contact Fair  Facial Expression Anxious  Affect Anxious  Speech Logical/coherent  Interaction Assertive  Motor Activity Slow  Appearance/Hygiene Unremarkable  Behavior Characteristics Cooperative  Mood Depressed  Aggressive Behavior  Effect No apparent injury  Thought Process  Coherency WDL  Content WDL  Delusions WDL  Perception WDL  Hallucination None reported or observed  Judgment Impaired  Confusion None  Danger to Self  Current suicidal ideation? Denies  Danger to Others  Danger to Others None reported or observed

## 2022-03-22 NOTE — BH IP Treatment Plan (Signed)
Interdisciplinary Treatment and Diagnostic Plan Update  03/22/2022 Time of Session: 1000 Theresa Rios MRN: [id number]  Principal Diagnosis: MDD (major depressive episode), single episode, severe, no psychosis (HCC)  Secondary Diagnoses: Principal Problem:   MDD (major depressive episode), single episode, severe, no psychosis (HCC) Active Problems:   Alcohol use disorder   Anxiety state   Insomnia   Current Medications:  Current Facility-Administered Medications  Medication Dose Route Frequency Provider Last Rate Last Admin   acetaminophen (TYLENOL) tablet 650 mg  650 mg Oral Q6H PRN Eligha Bridegroom, NP   650 mg at 03/22/22 0800   alum & mag hydroxide-simeth (MAALOX/MYLANTA) 200-200-20 MG/5ML suspension 30 mL  30 mL Oral Q4H PRN Eligha Bridegroom, NP       gabapentin (NEURONTIN) capsule 100 mg  100 mg Oral TID Starleen Blue, NP   100 mg at 03/22/22 1220   hydrOXYzine (ATARAX) tablet 25 mg  25 mg Oral TID PRN Eligha Bridegroom, NP   25 mg at 03/21/22 2132   loperamide (IMODIUM) capsule 2-4 mg  2-4 mg Oral PRN Starleen Blue, NP       LORazepam (ATIVAN) tablet 1 mg  1 mg Oral Q6H PRN Starleen Blue, NP       LORazepam (ATIVAN) tablet 1 mg  1 mg Oral TID Starleen Blue, NP   1 mg at 03/22/22 1220   Followed by   Melene Muller ON 03/23/2022] LORazepam (ATIVAN) tablet 1 mg  1 mg Oral BID Starleen Blue, NP       Followed by   Melene Muller ON 03/24/2022] LORazepam (ATIVAN) tablet 1 mg  1 mg Oral Daily Nkwenti, Doris, NP       magnesium hydroxide (MILK OF MAGNESIA) suspension 30 mL  30 mL Oral Daily PRN Eligha Bridegroom, NP       multivitamin with minerals tablet 1 tablet  1 tablet Oral Daily Nkwenti, Doris, NP   1 tablet at 03/22/22 0800   nicotine (NICODERM CQ - dosed in mg/24 hours) patch 14 mg  14 mg Transdermal Daily Onuoha, Chinwendu V, NP   14 mg at 03/22/22 0802   ondansetron (ZOFRAN-ODT) disintegrating tablet 4 mg  4 mg Oral Q6H PRN Starleen Blue, NP       sertraline (ZOLOFT) tablet 50 mg  50  mg Oral Daily Nkwenti, Doris, NP   50 mg at 03/22/22 0800   thiamine tablet 100 mg  100 mg Oral Daily Starleen Blue, NP   100 mg at 03/22/22 0800   PTA Medications: Medications Prior to Admission  Medication Sig Dispense Refill Last Dose   HYDROcodone-acetaminophen (NORCO) 5-325 MG tablet Take 1 tablet by mouth every 6 (six) hours as needed for severe pain. (Patient not taking: Reported on 03/19/2022) 10 tablet 0    ibuprofen (ADVIL) 800 MG tablet Take 800 mg by mouth 3 (three) times daily as needed for moderate pain.       Patient Stressors: Financial difficulties   Occupational concerns   Substance abuse    Patient Strengths: Average or above average intelligence  Motivation for treatment/growth  Supportive family/friends   Treatment Modalities: Medication Management, Group therapy, Case management,  1 to 1 session with clinician, Psychoeducation, Recreational therapy.   Physician Treatment Plan for Primary Diagnosis: MDD (major depressive episode), single episode, severe, no psychosis (HCC) Long Term Goal(s): Improvement in symptoms so as ready for discharge   Short Term Goals: Ability to identify changes in lifestyle to reduce recurrence of condition will improve Ability to disclose and discuss suicidal  ideas Ability to demonstrate self-control will improve Ability to identify and develop effective coping behaviors will improve Ability to maintain clinical measurements within normal limits will improve Ability to identify triggers associated with substance abuse/mental health issues will improve  Medication Management: Evaluate patient's response, side effects, and tolerance of medication regimen.  Therapeutic Interventions: 1 to 1 sessions, Unit Group sessions and Medication administration.  Evaluation of Outcomes: Progressing  Physician Treatment Plan for Secondary Diagnosis: Principal Problem:   MDD (major depressive episode), single episode, severe, no psychosis  (HCC) Active Problems:   Alcohol use disorder   Anxiety state   Insomnia  Long Term Goal(s): Improvement in symptoms so as ready for discharge   Short Term Goals: Ability to identify changes in lifestyle to reduce recurrence of condition will improve Ability to disclose and discuss suicidal ideas Ability to demonstrate self-control will improve Ability to identify and develop effective coping behaviors will improve Ability to maintain clinical measurements within normal limits will improve Ability to identify triggers associated with substance abuse/mental health issues will improve     Medication Management: Evaluate patient's response, side effects, and tolerance of medication regimen.  Therapeutic Interventions: 1 to 1 sessions, Unit Group sessions and Medication administration.  Evaluation of Outcomes: Progressing   RN Treatment Plan for Primary Diagnosis: MDD (major depressive episode), single episode, severe, no psychosis (HCC) Long Term Goal(s): Knowledge of disease and therapeutic regimen to maintain health will improve  Short Term Goals: Ability to remain free from injury will improve, Ability to verbalize frustration and anger appropriately will improve, Ability to demonstrate self-control, Ability to participate in decision making will improve, Ability to verbalize feelings will improve, Ability to disclose and discuss suicidal ideas, Ability to identify and develop effective coping behaviors will improve, and Compliance with prescribed medications will improve  Medication Management: RN will administer medications as ordered by provider, will assess and evaluate patient's response and provide education to patient for prescribed medication. RN will report any adverse and/or side effects to prescribing provider.  Therapeutic Interventions: 1 on 1 counseling sessions, Psychoeducation, Medication administration, Evaluate responses to treatment, Monitor vital signs and CBGs as  ordered, Perform/monitor CIWA, COWS, AIMS and Fall Risk screenings as ordered, Perform wound care treatments as ordered.  Evaluation of Outcomes: Progressing   LCSW Treatment Plan for Primary Diagnosis: MDD (major depressive episode), single episode, severe, no psychosis (HCC) Long Term Goal(s): Safe transition to appropriate next level of care at discharge, Engage patient in therapeutic group addressing interpersonal concerns.  Short Term Goals: Engage patient in aftercare planning with referrals and resources, Increase social support, Increase ability to appropriately verbalize feelings, Increase emotional regulation, Facilitate acceptance of mental health diagnosis and concerns, Facilitate patient progression through stages of change regarding substance use diagnoses and concerns, Identify triggers associated with mental health/substance abuse issues, and Increase skills for wellness and recovery  Therapeutic Interventions: Assess for all discharge needs, 1 to 1 time with Social worker, Explore available resources and support systems, Assess for adequacy in community support network, Educate family and significant other(s) on suicide prevention, Complete Psychosocial Assessment, Interpersonal group therapy.  Evaluation of Outcomes: Progressing   Progress in Treatment: Attending groups: Yes. Participating in groups: Yes. Taking medication as prescribed: Yes. Toleration medication: Yes. Family/Significant other contact made: No, will contact:  CSW will make additional attempts to reach [facility], husband.  Patient understands diagnosis: Yes. Discussing patient identified problems/goals with staff: Yes. Medical problems stabilized or resolved: Yes. Denies suicidal/homicidal ideation: No. Issues/concerns per patient  self-inventory: Yes. Other: none   New problem(s) identified: No, Describe:  none  New Short Term/Long Term Goal(s): Patient to work towards detox, elimination of symptoms  of psychosis, medication management for mood stabilization; elimination of SI thoughts; development of comprehensive mental wellness/sobriety plan.  Patient Goals:  Patient states her treatment goals is to improve "my behavior for one and learn as much as I can."   Discharge Plan or Barriers: No psychosocial barriers identified at this time, patient to return to place of residence when appropriate for discharge.   Reason for Continuation of Hospitalization: Anxiety Depression Active Substance Use  Estimated Length of Stay: 1-7 days   Scribe for Treatment Team: Larose Kells 03/22/2022 2:29 PM

## 2022-03-23 LAB — HIV ANTIBODY (ROUTINE TESTING W REFLEX): HIV Screen 4th Generation wRfx: NONREACTIVE

## 2022-03-23 LAB — RPR: RPR Ser Ql: NONREACTIVE

## 2022-03-23 NOTE — Progress Notes (Signed)
Adult Psychoeducational Group Note  Date:  03/23/2022 Time:  9:25 PM  Group Topic/Focus:  Wrap-Up Group:   The focus of this group is to help patients review their daily goal of treatment and discuss progress on daily workbooks.  Participation Level:  Active  Participation Quality:  Appropriate and Attentive  Affect:  Appropriate  Cognitive:  Alert and Appropriate  Insight: Appropriate and Limited  Engagement in Group:  Engaged  Modes of Intervention:  Discussion  Additional Comments:  Pt states she had a good day today due to the fact that she's been able to get three square meals in each day. Pt expressed some concerns about life after D/C and seems very occupied with her stay here. Pt states that a few of her goals is to keep up the job she has acquire housing, and work on her bad habits  Theresa Rios 03/23/2022, 9:25 PM

## 2022-03-23 NOTE — Progress Notes (Signed)
   03/23/22 2100  Psych Admission Type (Psych Patients Only)  Admission Status Involuntary  Psychosocial Assessment  Patient Complaints Substance abuse;Depression  Eye Contact Fair  Facial Expression Anxious  Affect Anxious  Speech Logical/coherent  Interaction Assertive  Motor Activity Slow  Appearance/Hygiene Unremarkable  Behavior Characteristics Cooperative  Mood Depressed  Aggressive Behavior  Effect No apparent injury  Thought Process  Coherency WDL  Content WDL  Delusions WDL  Perception WDL  Hallucination None reported or observed  Judgment Impaired  Confusion None  Danger to Self  Current suicidal ideation? Denies  Danger to Others  Danger to Others None reported or observed

## 2022-03-23 NOTE — Progress Notes (Signed)
   03/23/22 1000  Psych Admission Type (Psych Patients Only)  Admission Status Involuntary  Psychosocial Assessment  Patient Complaints Substance abuse;Anxiety  Eye Contact Fair  Facial Expression Anxious  Affect Anxious  Speech Logical/coherent  Interaction Assertive  Motor Activity Slow  Appearance/Hygiene Unremarkable  Behavior Characteristics Cooperative  Mood Depressed  Aggressive Behavior  Effect No apparent injury  Thought Process  Coherency WDL  Content WDL  Delusions WDL  Perception WDL  Hallucination None reported or observed  Judgment Impaired  Confusion None  Danger to Self  Current suicidal ideation? Denies  Danger to Others  Danger to Others None reported or observed

## 2022-03-23 NOTE — Group Note (Signed)
LCSW Group Therapy Note  03/23/2022   10:30-11:30am   Topic:  Anger and its Underlying Emotions  Participation Level:  Did Not Attend  Description of Group:   In this group, patients identified the primary emotions they often have in situations that eventually provoke them to anger.  Focus was placed on how helpful it is to recognize the underlying emotions to anger in order to address these for more permanent resolution.  Emphasis was also on identifying possible replacement thoughts for the automatic thoughts generated in various situations shared by the group.  Therapeutic Goals: Patients will share emotions that commonly incite their anger and how they typically respond Patients will identify how their coping skills work for them and/or against them Patients will explore possible alternative thoughts to their automatic ones Patients will learn that anger itself is normal and that healthier reactions can assist with resolving conflict rather than worsening situations  Summary of Patient Progress:  The patient was invited to group, did not attend.  Therapeutic Modalities:   Cognitive Behavioral Therapy Processing  Millianna Szymborski J Grossman-Orr, MSW, LCSW  

## 2022-03-23 NOTE — Progress Notes (Signed)

## 2022-03-23 NOTE — Progress Notes (Signed)
[facility] MD Progress Note  03/23/2022 12:08 PM Theresa Rios  MRN:  [id number]  Subjective:  Theresa Rios reported " I am going to start working with a program called [employer]"   Evaluation: Seen and evaluated face-to-face by this provider.  Patient observed resting in bed.  Reports her mood has been better since her admission.  Initially patient requested to discharge tomorrow 03/24/2022, then requested to speak to this provider stated " next Saturday would be better."  Theresa Rios is denying suicidal or homicidal ideations.  Denies auditory visual hallucinations.  Reports she has plans to follow-up with [facility] to start working and get her life back on track. States she is hopeful that this program will" pan out" because she will be able to continue to see her boyfriend and will be able to have stable housing.  She is denying any withdrawal symptoms or alcohol cravings.    She reports " I have been catching up on my rest."  States she has plans to attend afternoon group sessions.  Patient was initiated on Zoloft with a recent titration to 75 mg daily, she is to continue gabapentin 200 mg p.o. 3 times daily.  She reports taking and tolerating well.   Theresa Rios denied any other safety concerns during this assessment.  Support, encouragement reassurance was provided. Staff to continue to monitor for safety.    Principal Problem: MDD (major depressive episode), single episode, severe, no psychosis (HCC) Diagnosis: Principal Problem:   MDD (major depressive episode), single episode, severe, no psychosis (HCC) Active Problems:   Alcohol use disorder   Anxiety state   Insomnia  Total Time spent with patient: 15 minutes  Past Psychiatric History:   Past Medical History:  Past Medical History:  Diagnosis Date   Seizures (HCC)    History reviewed. No pertinent surgical history. Family History: History reviewed. No pertinent family history. Family Psychiatric  History:  Social History:  Social History    Substance and Sexual Activity  Alcohol Use Yes   Comment: 4-5 beers a day most days     Social History   Substance and Sexual Activity  Drug Use Yes   Types: Cocaine, Marijuana   Comment: last marijuana use: 2 mos; last cocaine use: 3 days ago    Social History   Socioeconomic History   Marital status: Media planner    Spouse name: Not on file   Number of children: 0   Years of education: Not on file   Highest education level: Not on file  Occupational History   Not on file  Tobacco Use   Smoking status: Every Day    Packs/day: 0.50    Types: Cigarettes   Smokeless tobacco: Never  Vaping Use   Vaping Use: Never used  Substance and Sexual Activity   Alcohol use: Yes    Comment: 4-5 beers a day most days   Drug use: Yes    Types: Cocaine, Marijuana    Comment: last marijuana use: 2 mos; last cocaine use: 3 days ago   Sexual activity: Not on file  Other Topics Concern   Not on file  Social History Narrative   Pt lives in [place] with a friend. Her husband is living with his mother. Pt recently lost her job and her place to stay.   Social Determinants of Health   Financial Resource Strain: Not on file  Food Insecurity: Not on file  Transportation Needs: Not on file  Physical Activity: Not on file  Stress: Not  on file  Social Connections: Not on file   Additional Social History:                         Sleep: Fair  Appetite:  Good  Current Medications: Current Facility-Administered Medications  Medication Dose Route Frequency Provider Last Rate Last Admin   acetaminophen (TYLENOL) tablet 650 mg  650 mg Oral Q6H PRN Eligha Bridegroom, NP   650 mg at 03/23/22 0726   alum & mag hydroxide-simeth (MAALOX/MYLANTA) 200-200-20 MG/5ML suspension 30 mL  30 mL Oral Q4H PRN Eligha Bridegroom, NP       gabapentin (NEURONTIN) capsule 200 mg  200 mg Oral TID Starleen Blue, NP   200 mg at 03/23/22 1610   hydrOXYzine (ATARAX) tablet 25 mg  25 mg Oral TID PRN  Eligha Bridegroom, NP   25 mg at 03/22/22 2126   loperamide (IMODIUM) capsule 2-4 mg  2-4 mg Oral PRN Starleen Blue, NP       LORazepam (ATIVAN) tablet 1 mg  1 mg Oral Q6H PRN Starleen Blue, NP       LORazepam (ATIVAN) tablet 1 mg  1 mg Oral BID Starleen Blue, NP   1 mg at 03/23/22 9604   Followed by   Melene Muller ON 03/24/2022] LORazepam (ATIVAN) tablet 1 mg  1 mg Oral Daily Nkwenti, Doris, NP       magnesium hydroxide (MILK OF MAGNESIA) suspension 30 mL  30 mL Oral Daily PRN Eligha Bridegroom, NP       multivitamin with minerals tablet 1 tablet  1 tablet Oral Daily Starleen Blue, NP   1 tablet at 03/23/22 5409   nicotine (NICODERM CQ - dosed in mg/24 hours) patch 14 mg  14 mg Transdermal Daily Onuoha, Chinwendu V, NP   14 mg at 03/23/22 1146   nitrofurantoin (macrocrystal-monohydrate) (MACROBID) capsule 100 mg  100 mg Oral Q12H Nkwenti, Doris, NP   100 mg at 03/23/22 0727   ondansetron (ZOFRAN-ODT) disintegrating tablet 4 mg  4 mg Oral Q6H PRN Starleen Blue, NP       sertraline (ZOLOFT) tablet 75 mg  75 mg Oral Daily Starleen Blue, NP   75 mg at 03/23/22 8119   thiamine tablet 100 mg  100 mg Oral Daily Starleen Blue, NP   100 mg at 03/23/22 1478   traZODone (DESYREL) tablet 50 mg  50 mg Oral QHS PRN Starleen Blue, NP   50 mg at 03/22/22 2126    Lab Results:  Results for orders placed or performed during the hospital encounter of 03/21/22 (from the past 48 hour(s))  Lipid panel     Status: Abnormal   Collection Time: 03/22/22  6:48 AM  Result Value Ref Range   Cholesterol 223 (H) 0 - 200 mg/dL   Triglycerides 295 <621 mg/dL   HDL 91 >30 mg/dL   Total CHOL/HDL Ratio 2.5 RATIO   VLDL 20 0 - 40 mg/dL   LDL Cholesterol 865 (H) 0 - 99 mg/dL    Comment:        Total Cholesterol/HDL:CHD Risk Coronary Heart Disease Risk Table                     Men   Women  1/2 Average Risk   3.4   3.3  Average Risk       5.0   4.4  2 X Average Risk   9.6   7.1  3 X Average Risk  23.4  11.0        Use  the calculated Patient Ratio above and the CHD Risk Table to determine the patient's CHD Risk.        ATP III CLASSIFICATION (LDL):  <100     mg/dL   Optimal  161-096  mg/dL   Near or Above                    Optimal  130-159  mg/dL   Borderline  045-409  mg/dL   High  >811     mg/dL   Very High Performed at [facility], [address], [place], [place] [address]   TSH     Status: None   Collection Time: 03/22/22  6:48 AM  Result Value Ref Range   TSH 1.567 0.350 - 4.500 uIU/mL    Comment: Performed by a 3rd Generation assay with a functional sensitivity of <=0.01 uIU/mL. Performed at [facility], [address] [place], [place] [address]   Hemoglobin A1c     Status: Abnormal   Collection Time: 03/22/22  6:48 AM  Result Value Ref Range   Hgb A1c MFr Bld 5.7 (H) 4.8 - 5.6 %    Comment: (NOTE) Pre diabetes:          5.7%-6.4%  Diabetes:              >6.4%  Glycemic control for   <7.0% adults with diabetes    Mean Plasma Glucose 116.89 mg/dL    Comment: Performed at [facility], [address] [place], [place] [address]  Urinalysis, Complete w Microscopic Urine, Clean Catch     Status: Abnormal   Collection Time: 03/22/22  5:56 PM  Result Value Ref Range   Color, Urine STRAW (A) YELLOW   APPearance CLEAR CLEAR   Specific Gravity, Urine 1.009 1.005 - 1.030   pH 6.0 5.0 - 8.0   Glucose, UA NEGATIVE NEGATIVE mg/dL   Hgb urine dipstick NEGATIVE NEGATIVE   Bilirubin Urine NEGATIVE NEGATIVE   Ketones, ur NEGATIVE NEGATIVE mg/dL   Protein, ur NEGATIVE NEGATIVE mg/dL   Nitrite NEGATIVE NEGATIVE   Leukocytes,Ua LARGE (A) NEGATIVE   RBC / HPF 0-5 0 - 5 RBC/hpf   WBC, UA 6-10 0 - 5 WBC/hpf   Bacteria, UA RARE (A) NONE SEEN   Squamous Epithelial / LPF 0-5 0 - 5    Comment: Performed at [facility], [address] [place], [place] [address]  RPR     Status: None   Collection Time: 03/22/22  6:15 PM  Result  Value Ref Range   RPR Ser Ql NON REACTIVE NON REACTIVE    Comment: Performed at [facility], [address], [place], [place] [address]  HIV Antibody (routine testing w rflx)     Status: None   Collection Time: 03/22/22  6:15 PM  Result Value Ref Range   HIV Screen 4th Generation wRfx Non Reactive Non Reactive    Comment: Performed at [facility], [address], [place], [place] [address]    Blood Alcohol level:  Lab Results  Component Value Date   ETH 178 (H) 03/19/2022   ETH 202 (H) 03/14/2022    Metabolic Disorder Labs: Lab Results  Component Value Date   HGBA1C 5.7 (H) 03/22/2022   MPG 116.89 03/22/2022   No results found for: "PROLACTIN" Lab Results  Component Value Date   CHOL 223 (H) 03/22/2022  TRIG 102 03/22/2022   HDL 91 03/22/2022   CHOLHDL 2.5 03/22/2022   VLDL 20 03/22/2022   LDLCALC 112 (H) 03/22/2022    Physical Findings: AIMS:  , ,  ,  ,    CIWA:  CIWA-Ar Total: 0 COWS:     Musculoskeletal: Strength & Muscle Tone: within normal limits Gait & Station: normal Patient leans: N/A  Psychiatric Specialty Exam:  Presentation  General Appearance: Appropriate for Environment; Fairly Groomed  Eye Contact:Good  Speech:Clear and Coherent  Speech Volume:Normal  Handedness:Right   Mood and Affect  Mood:Euthymic  Affect:Congruent   Thought Process  Thought Processes:Coherent  Descriptions of Associations:Intact  Orientation:Full (Time, Place and Person)  Thought Content:Logical  History of Schizophrenia/Schizoaffective disorder:No data recorded Duration of Psychotic Symptoms:No data recorded Hallucinations:Hallucinations: None  Ideas of Reference:None  Suicidal Thoughts:Suicidal Thoughts: No  Homicidal Thoughts:Homicidal Thoughts: No   Sensorium  Memory:Immediate Good  Judgment:Good  Insight:Good   Executive Functions  Concentration:Good  Attention Span:Good  Recall:Good  Fund of  Knowledge:Good  Language:Good   Psychomotor Activity  Psychomotor Activity:Psychomotor Activity: Normal   Assets  Assets:Communication Skills   Sleep  Sleep:Sleep: Good    Physical Exam: Physical Exam Vitals and nursing note reviewed.  Constitutional:      Appearance: Normal appearance.  HENT:     Head: Normocephalic.  Cardiovascular:     Rate and Rhythm: Normal rate and regular rhythm.  Neurological:     General: No focal deficit present.     Mental Status: She is alert and oriented to person, place, and time.  Psychiatric:        Mood and Affect: Mood normal.        Thought Content: Thought content normal.    Review of Systems  HENT: Negative.    Cardiovascular: Negative.   Psychiatric/Behavioral:  Positive for depression and substance abuse. The patient is nervous/anxious.   All other systems reviewed and are negative.  Blood pressure 119/87, pulse (!) 104, temperature 98.2 F (36.8 C), temperature source Oral, resp. rate 18, height 5\' 2"  (1.575 m), weight 66.7 kg, last menstrual period 05/17/2014, SpO2 100 %. Body mass index is 26.89 kg/m.   Treatment Plan Summary: Daily contact with patient to assess and evaluate symptoms and progress in treatment and Medication management  Continue with current treatment plan on 03/23/2022 as listed below except were noted Major depressive disorder: Generalized anxiety disorder: Substance abuse disorder:  Continue gabapentin 200 mg p.o. 3 times daily Continue use Zoloft 75 mg p.o. daily CIWA protocol for Ativan see chart Continue trazodone 50 mg p.o. nightly as needed  CSW to continue working on discharge disposition Patient encouraged to participate within the therapeutic milieu  03/25/2022, NP 03/23/2022, 12:08 PM

## 2022-03-23 NOTE — [facility] Group Notes (Signed)
[facility] Group Notes:  (Nursing/MHT/Case Management/Adjunct)  Date:  03/23/2022  Time:  10:11 AM  Type of Therapy:   Orientation/Goals group   Participation Level:  Did Not Attend  Participation Quality:      Affect:      Cognitive:      Insight:  None  Engagement in Group:      Modes of Intervention:      Summary of Progress/Problems: pt did not attend group.   Fatima Blank 03/23/2022, 10:11 AM

## 2022-03-24 LAB — URINE CULTURE

## 2022-03-24 MED ORDER — SERTRALINE HCL 100 MG PO TABS
100.0000 mg | ORAL_TABLET | Freq: Every day | ORAL | Status: DC
  Administered 2022-03-25 – 2022-03-27 (×3): 100 mg via ORAL
  Filled 2022-03-24 (×4): qty 1

## 2022-03-24 NOTE — Progress Notes (Signed)
Adult Psychoeducational Group Note  Date:  03/24/2022 Time:  8:58 PM  Group Topic/Focus:  Wrap-Up Group:   The focus of this group is to help patients review their daily goal of treatment and discuss progress on daily workbooks.  Participation Level:  Active  Participation Quality:  Appropriate and Attentive  Affect:  Appropriate  Cognitive:  Alert and Appropriate  Insight: Appropriate  Engagement in Group:  Engaged  Modes of Intervention:  Discussion  Additional Comments:   Pt was engaged but tangential during group discussion. Pt was limited in her discussion during group. Pt did convey that despite the issues she's had today (with peers) she had a good day.  Vevelyn Pat 03/24/2022, 8:58 PM

## 2022-03-24 NOTE — Group Note (Signed)
[facility] LCSW Group Therapy Note  Date/Time:  03/24/2022 10:00am-11:00am  Type of Therapy and Topic:  Group Therapy:  Healthy and Unhealthy Supports  Participation Level:  Did Not Attend   Description of Group:  Patients in this group were introduced to the idea of adding a variety of healthy supports to address the various needs in their lives.  Patients discussed what additional healthy supports could be helpful in their recovery and wellness after discharge in order to prevent future hospitalizations such as counselor, doctor, other levels of psychiatric care such as ACTT services, therapy groups, 12-step groups, and problem-specific support groups.  A demonstration was given about how to set boundaries which patients expressed was beneficial.  Several songs were played to inspire patients to be more self-supportive.  Therapeutic Goals:   1)  discuss importance of adding supports to stay well once out of the hospital  2)  compare healthy versus unhealthy supports and identify some examples of each  3)  generate ideas and descriptions of healthy supports that can be added  4)  offer mutual support about how to address unhealthy supports  5)  encourage active participation in and adherence to discharge plan    Summary of Patient Progress:  The patient was invited to group, did not attend.   Therapeutic Modalities:   Motivational Interviewing Brief Solution-Focused Therapy  Ambrose Mantle, LCSW

## 2022-03-24 NOTE — Progress Notes (Addendum)
[facility] MD Progress Note  03/24/2022 11:35 AM Theresa Rios  MRN:  [id number]  Evaluation:  Theresa Rios observed resting in bed.  States that she is attempting to " catch up on sleep."  States she has been in contact with her husband who has been supportive.  She continues to report [employer] will assist her with employment after discharge. [place] reports she is expecting to be back on her feet by the middle of next week. "  I have some income coming like Wednesday or Thursday."  Patient stated she is unsure who she will follow-up with for medication management at discharge.   Theresa Rios is denying suicidal or homicidal ideations.  Denies auditory visual hallucinations.  Denies cravings for alcohol or cocaine use.  Patient was restarted on medication during this admission.  Discussed increasing Zoloft 75 mg to 100 mg daily.  She was receptive to plan.  Reports a good appetite.  States she is resting well throughout the night.  Staff to continue to monitor for safety.  Support, encouragement reassurance was provided.  During evaluation Theresa Rios is resting in bed  in no acute distress. She  is alert/oriented x 3; calm/cooperative; and mood congruent with affect. She is speaking in a clear tone at moderate volume, and normal pace; with good eye contact. Her thought process is coherent and relevant; There is no indication that she is currently responding to internal/external stimuli or experiencing delusional thought content; and she has denied suicidal/self-harm/homicidal ideation, psychosis, and paranoia. Patient has remained calm throughout assessment and has answered questions appropriately.      Principal Problem: MDD (major depressive episode), single episode, severe, no psychosis (HCC) Diagnosis: Principal Problem:   MDD (major depressive episode), single episode, severe, no psychosis (HCC) Active Problems:   Alcohol use disorder   Anxiety state   Insomnia  Total Time spent with patient: 15 minutes  Past  Psychiatric History:   Past Medical History:  Past Medical History:  Diagnosis Date   Seizures (HCC)    History reviewed. No pertinent surgical history. Family History: History reviewed. No pertinent family history. Family Psychiatric  History:  Social History:  Social History   Substance and Sexual Activity  Alcohol Use Yes   Comment: 4-5 beers a day most days     Social History   Substance and Sexual Activity  Drug Use Yes   Types: Cocaine, Marijuana   Comment: last marijuana use: 2 mos; last cocaine use: 3 days ago    Social History   Socioeconomic History   Marital status: Media planner    Spouse name: Not on file   Number of children: 0   Years of education: Not on file   Highest education level: Not on file  Occupational History   Not on file  Tobacco Use   Smoking status: Every Day    Packs/day: 0.50    Types: Cigarettes   Smokeless tobacco: Never  Vaping Use   Vaping Use: Never used  Substance and Sexual Activity   Alcohol use: Yes    Comment: 4-5 beers a day most days   Drug use: Yes    Types: Cocaine, Marijuana    Comment: last marijuana use: 2 mos; last cocaine use: 3 days ago   Sexual activity: Not on file  Other Topics Concern   Not on file  Social History Narrative   Pt lives in [place] with a friend. Her husband is living with his mother. Pt recently lost her job and her  place to stay.   Social Determinants of Health   Financial Resource Strain: Not on file  Food Insecurity: Not on file  Transportation Needs: Not on file  Physical Activity: Not on file  Stress: Not on file  Social Connections: Not on file   Additional Social History:                         Sleep: Fair  Appetite:  Good  Current Medications: Current Facility-Administered Medications  Medication Dose Route Frequency Provider Last Rate Last Admin   acetaminophen (TYLENOL) tablet 650 mg  650 mg Oral Q6H PRN Eligha Bridegroom, NP   650 mg at 03/23/22 2137    alum & mag hydroxide-simeth (MAALOX/MYLANTA) 200-200-20 MG/5ML suspension 30 mL  30 mL Oral Q4H PRN Eligha Bridegroom, NP       gabapentin (NEURONTIN) capsule 200 mg  200 mg Oral TID Starleen Blue, NP   200 mg at 03/24/22 0732   hydrOXYzine (ATARAX) tablet 25 mg  25 mg Oral TID PRN Eligha Bridegroom, NP   25 mg at 03/22/22 2126   loperamide (IMODIUM) capsule 2-4 mg  2-4 mg Oral PRN Starleen Blue, NP       LORazepam (ATIVAN) tablet 1 mg  1 mg Oral Q6H PRN Starleen Blue, NP       magnesium hydroxide (MILK OF MAGNESIA) suspension 30 mL  30 mL Oral Daily PRN Eligha Bridegroom, NP       multivitamin with minerals tablet 1 tablet  1 tablet Oral Daily Starleen Blue, NP   1 tablet at 03/24/22 0732   nicotine (NICODERM CQ - dosed in mg/24 hours) patch 14 mg  14 mg Transdermal Daily Onuoha, Chinwendu V, NP   14 mg at 03/24/22 0737   nitrofurantoin (macrocrystal-monohydrate) (MACROBID) capsule 100 mg  100 mg Oral Q12H Nkwenti, Doris, NP   100 mg at 03/24/22 0733   ondansetron (ZOFRAN-ODT) disintegrating tablet 4 mg  4 mg Oral Q6H PRN Starleen Blue, NP       sertraline (ZOLOFT) tablet 75 mg  75 mg Oral Daily Starleen Blue, NP   75 mg at 03/24/22 0732   thiamine tablet 100 mg  100 mg Oral Daily Starleen Blue, NP   100 mg at 03/24/22 0732   traZODone (DESYREL) tablet 50 mg  50 mg Oral QHS PRN Starleen Blue, NP   50 mg at 03/23/22 2137    Lab Results:  Results for orders placed or performed during the hospital encounter of 03/21/22 (from the past 48 hour(s))  Urinalysis, Complete w Microscopic Urine, Clean Catch     Status: Abnormal   Collection Time: 03/22/22  5:56 PM  Result Value Ref Range   Color, Urine STRAW (A) YELLOW   APPearance CLEAR CLEAR   Specific Gravity, Urine 1.009 1.005 - 1.030   pH 6.0 5.0 - 8.0   Glucose, UA NEGATIVE NEGATIVE mg/dL   Hgb urine dipstick NEGATIVE NEGATIVE   Bilirubin Urine NEGATIVE NEGATIVE   Ketones, ur NEGATIVE NEGATIVE mg/dL   Protein, ur NEGATIVE NEGATIVE  mg/dL   Nitrite NEGATIVE NEGATIVE   Leukocytes,Ua LARGE (A) NEGATIVE   RBC / HPF 0-5 0 - 5 RBC/hpf   WBC, UA 6-10 0 - 5 WBC/hpf   Bacteria, UA RARE (A) NONE SEEN   Squamous Epithelial / LPF 0-5 0 - 5    Comment: Performed at [facility], [address] [place], [place] [address]  Urine Culture  Status: Abnormal   Collection Time: 03/22/22  5:56 PM   Specimen: Urine, Clean Catch  Result Value Ref Range   Specimen Description      URINE, CLEAN CATCH Performed at [facility], [address] [place], [place] [address]    Special Requests      NONE Performed at [facility] [address] [place], [place] [address]    Culture MULTIPLE SPECIES PRESENT, SUGGEST RECOLLECTION (A)    Report Status 03/24/2022 FINAL   RPR     Status: None   Collection Time: 03/22/22  6:15 PM  Result Value Ref Range   RPR Ser Ql NON REACTIVE NON REACTIVE    Comment: Performed at [facility], [address], [place], [place] [address]  HIV Antibody (routine testing w rflx)     Status: None   Collection Time: 03/22/22  6:15 PM  Result Value Ref Range   HIV Screen 4th Generation wRfx Non Reactive Non Reactive    Comment: Performed at [facility], [address] [place], [place] [address]    Blood Alcohol level:  Lab Results  Component Value Date   ETH 178 (H) 03/19/2022   ETH 202 (H) 03/14/2022    Metabolic Disorder Labs: Lab Results  Component Value Date   HGBA1C 5.7 (H) 03/22/2022   MPG 116.89 03/22/2022   No results found for: "PROLACTIN" Lab Results  Component Value Date   CHOL 223 (H) 03/22/2022   TRIG 102 03/22/2022   HDL 91 03/22/2022   CHOLHDL 2.5 03/22/2022   VLDL 20 03/22/2022   LDLCALC 112 (H) 03/22/2022    Physical Findings: AIMS:  , ,  ,  ,    CIWA:  CIWA-Ar Total: 0 COWS:     Musculoskeletal: Strength & Muscle Tone: within normal limits Gait & Station: normal Patient leans:  N/A  Psychiatric Specialty Exam:  Presentation  General Appearance: Appropriate for Environment; Fairly Groomed  Eye Contact:Good  Speech:Clear and Coherent  Speech Volume:Normal  Handedness:Right   Mood and Affect  Mood:Euthymic  Affect:Congruent   Thought Process  Thought Processes:Coherent  Descriptions of Associations:Intact  Orientation:Full (Time, Place and Person)  Thought Content:Logical  History of Schizophrenia/Schizoaffective disorder:No data recorded Duration of Psychotic Symptoms:No data recorded Hallucinations:No data recorded  Ideas of Reference:None  Suicidal Thoughts:No data recorded  Homicidal Thoughts:No data recorded   Sensorium  Memory:Immediate Good  Judgment:Good  Insight:Good   Executive Functions  Concentration:Good  Attention Span:Good  Recall:Good  Fund of Knowledge:Good  Language:Good   Psychomotor Activity  Psychomotor Activity:No data recorded   Assets  Assets:Communication Skills   Sleep  Sleep:No data recorded    Physical Exam: Physical Exam Vitals and nursing note reviewed.  Constitutional:      Appearance: Normal appearance.  HENT:     Head: Normocephalic.  Cardiovascular:     Rate and Rhythm: Normal rate and regular rhythm.  Neurological:     General: No focal deficit present.     Mental Status: She is alert and oriented to person, place, and time.  Psychiatric:        Mood and Affect: Mood normal.        Thought Content: Thought content normal.   Review of Systems  HENT: Negative.    Cardiovascular: Negative.   Psychiatric/Behavioral:  Positive for depression and substance abuse. The patient is nervous/anxious.   All other systems reviewed and are negative.  Blood pressure 138/87, pulse 90, temperature 98.4 F (  36.9 C), temperature source Oral, resp. rate 16, height 5\' 2"  (1.575 m), weight 66.7 kg, last menstrual period 05/17/2014, SpO2 99 %. Body mass index is 26.89  kg/m.   Treatment Plan Summary: Daily contact with patient to assess and evaluate symptoms and progress in treatment and Medication management  Continue with current treatment plan on 03/24/2022 as listed below except were noted:  Major depressive disorder: Generalized anxiety disorder: Substance abuse disorder:  Continue gabapentin 200 mg p.o. 3 times daily Increased  Zoloft 75 mg to 100 mg   p.o. daily CIWA protocol for Ativan see chart Continue trazodone 50 mg p.o. nightly as needed  CSW to continue working on discharge disposition Patient encouraged to participate within the therapeutic milieu  03/26/2022, NP 03/24/2022, 11:35 AM

## 2022-03-24 NOTE — Progress Notes (Signed)
Pt presents with bright affect and happy mood.  Pt denies SI/HI/AVH.  Pt is pleasant and cooperative.  Pt took medications without incident and no adverse reactions were noted.  RN will continue to monitor pt's progress and provide assistance as needed.

## 2022-03-25 LAB — GC/CHLAMYDIA PROBE AMP (~~LOC~~) NOT AT ARMC
Chlamydia: NEGATIVE
Comment: NEGATIVE
Comment: NORMAL
Neisseria Gonorrhea: NEGATIVE

## 2022-03-25 MED ORDER — TRAZODONE HCL 100 MG PO TABS
100.0000 mg | ORAL_TABLET | Freq: Every evening | ORAL | Status: DC | PRN
  Administered 2022-03-25 – 2022-03-26 (×2): 100 mg via ORAL
  Filled 2022-03-25: qty 1
  Filled 2022-03-25: qty 7
  Filled 2022-03-25: qty 1

## 2022-03-25 MED ORDER — GABAPENTIN 300 MG PO CAPS
300.0000 mg | ORAL_CAPSULE | Freq: Three times a day (TID) | ORAL | Status: DC
  Administered 2022-03-25 – 2022-03-26 (×3): 300 mg via ORAL
  Filled 2022-03-25 (×8): qty 1

## 2022-03-25 NOTE — Plan of Care (Signed)
  Problem: Safety: Goal: Non-violent Restraint(s) Outcome: Progressing   Problem: Education: Goal: Knowledge of [facility] Education information/materials will improve Outcome: Progressing Goal: Emotional status will improve Outcome: Progressing Goal: Mental status will improve Outcome: Progressing Goal: Verbalization of understanding the information provided will improve Outcome: Progressing   Problem: Activity: Goal: Interest or engagement in activities will improve Outcome: Progressing Goal: Sleeping patterns will improve Outcome: Progressing   Problem: Coping: Goal: Ability to verbalize frustrations and anger appropriately will improve Outcome: Progressing Goal: Ability to demonstrate self-control will improve Outcome: Progressing   Problem: Health Behavior/Discharge Planning: Goal: Identification of resources available to assist in meeting health care needs will improve Outcome: Progressing Goal: Compliance with treatment plan for underlying cause of condition will improve Outcome: Progressing   Problem: Physical Regulation: Goal: Ability to maintain clinical measurements within normal limits will improve Outcome: Progressing   Problem: Safety: Goal: Periods of time without injury will increase Outcome: Progressing   Problem: Education: Goal: Utilization of techniques to improve thought processes will improve Outcome: Progressing Goal: Knowledge of the prescribed therapeutic regimen will improve Outcome: Progressing   Problem: Activity: Goal: Interest or engagement in leisure activities will improve Outcome: Progressing Goal: Imbalance in normal sleep/wake cycle will improve Outcome: Progressing   Problem: Coping: Goal: Coping ability will improve Outcome: Progressing Goal: Will verbalize feelings Outcome: Progressing   Problem: Health Behavior/Discharge Planning: Goal: Ability to make decisions will improve Outcome: Progressing Goal:  Compliance with therapeutic regimen will improve Outcome: Progressing   Problem: Role Relationship: Goal: Will demonstrate positive changes in social behaviors and relationships Outcome: Progressing   Problem: Safety: Goal: Ability to disclose and discuss suicidal ideas will improve Outcome: Progressing Goal: Ability to identify and utilize support systems that promote safety will improve Outcome: Progressing   Problem: Self-Concept: Goal: Will verbalize positive feelings about self Outcome: Progressing Goal: Level of anxiety will decrease Outcome: Progressing   Problem: Education: Goal: Knowledge of disease or condition will improve Outcome: Progressing Goal: Understanding of discharge needs will improve Outcome: Progressing   Problem: Health Behavior/Discharge Planning: Goal: Ability to identify changes in lifestyle to reduce recurrence of condition will improve Outcome: Progressing Goal: Identification of resources available to assist in meeting health care needs will improve Outcome: Progressing   Problem: Physical Regulation: Goal: Complications related to the disease process, condition or treatment will be avoided or minimized Outcome: Progressing   Problem: Safety: Goal: Ability to remain free from injury will improve Outcome: Progressing

## 2022-03-25 NOTE — [facility] Group Notes (Signed)
Spiritual care group on grief and loss facilitated by chaplain Katy Mose Colaizzi, [facility]   Group Goal:   Support / Education around grief and loss   Members engage in facilitated group support and psycho-social education.   Group Description:   Following introductions and group rules, group members engaged in facilitated group dialog and support around topic of loss, with particular support around experiences of loss in their lives. Group Identified types of loss (relationships / self / things) and identified patterns, circumstances, and changes that precipitate losses. Reflected on thoughts / feelings around loss, normalized grief responses, and recognized variety in grief experience. Group noted Worden's four tasks of grief in discussion.   Group drew on Adlerian / Rogerian, narrative, MI,   Patient Progress: Did not attend.  

## 2022-03-25 NOTE — Progress Notes (Signed)
Adult Psychoeducational Group Note  Date:  03/25/2022 Time:  10:24 PM  Group Topic/Focus:  AA  Participation Level:  Active  Participation Quality:  Appropriate and Attentive  Affect:  Appropriate  Cognitive:  Appropriate  Insight: Appropriate  Engagement in Group:  Engaged  Modes of Intervention:  Discussion  Additional Comments:   Pt attended and participated in the AA group.  Edmund Hilda Saliah Crisp 03/25/2022, 10:24 PM

## 2022-03-25 NOTE — Progress Notes (Signed)
[facility] MD Progress Note  03/25/2022 5:00 PM Theresa Rios  MRN:  [id number]  Reason For Admission: Theresa Rios is a 56 y.o [place] American female with no prior mental health diagnoses & a history of ETOH abuse who was taken to the [facility] ED by EMS after suspected overdose on Melatonin. She was medically cleared and transferred to this [facility] for treatment and stabilization of her mood.   24 hr Chart Review: Pt's chart reviewed, his case discussed with his treatment team. Pt slept for  5.5 hrs last night as per nursing flow sheets. She has been compliant with scheduled medications, and has been participating in some unit activities over the past 24 hrs. As per nursing documentation, she has been cooperative, but anxious & animated over the past 24 hrs.V/S earlier today morning were WNL, with the exception of a slightly elevated HR of 101. She required a dose of Trazodone 50 mg last night for insomnia.  Today's patient assessment note: On assessment today, pt presents with an anxious mood & affect is congruent. Her attention to personal hygiene and grooming is fair, eye contact is good, speech is clear & coherent. Thought contents are organized and logical, and pt currently denies SI/HI/AVH or paranoia. There is no evidence of delusional thoughts.    Pt reports that her sleep quality last night poor, and she reports a good appetite. She reports back pain and has been educated that she has Tylenol 650 mg PRN for pain. Pt denies any medication related side effects, and states that she plans on going to "[employer]" to get a job, and to get a motel room once she discharges. She has been educated that discharge will most likely be on Wednesday if her f/u MH appointments are in place, and verbalizes understanding. She states that her boyfriend will get a motel room for her. CSW will be made aware. Pt denies any other concerns. We are increasing her PRN Trazodone to 100 mg PRN nightly for insomnia. Will  also increase Gabapentin to 300 mg TID for anxiety. Will continue other medications as below.   Principal Problem: MDD (major depressive episode), single episode, severe, no psychosis (HCC) Diagnosis: Principal Problem:   MDD (major depressive episode), single episode, severe, no psychosis (HCC) Active Problems:   Alcohol use disorder   Anxiety state   Insomnia  Total Time spent with patient: 30 minutes  Past Psychiatric History: As above  Past Medical History:  Past Medical History:  Diagnosis Date   Seizures (HCC)    History reviewed. No pertinent surgical history. Family History: History reviewed. No pertinent family history. Family Psychiatric  History: none reported Social History:  Social History   Substance and Sexual Activity  Alcohol Use Yes   Comment: 4-5 beers a day most days     Social History   Substance and Sexual Activity  Drug Use Yes   Types: Cocaine, Marijuana   Comment: last marijuana use: 2 mos; last cocaine use: 3 days ago    Social History   Socioeconomic History   Marital status: Media planner    Spouse name: Not on file   Number of children: 0   Years of education: Not on file   Highest education level: Not on file  Occupational History   Not on file  Tobacco Use   Smoking status: Every Day    Packs/day: 0.50    Types: Cigarettes   Smokeless tobacco: Never  Vaping Use   Vaping Use: Never  used  Substance and Sexual Activity   Alcohol use: Yes    Comment: 4-5 beers a day most days   Drug use: Yes    Types: Cocaine, Marijuana    Comment: last marijuana use: 2 mos; last cocaine use: 3 days ago   Sexual activity: Not on file  Other Topics Concern   Not on file  Social History Narrative   Pt lives in [place] with a friend. Her husband is living with his mother. Pt recently lost her job and her place to stay.   Social Determinants of Health   Financial Resource Strain: Not on file  Food Insecurity: Not on file  Transportation  Needs: Not on file  Physical Activity: Not on file  Stress: Not on file  Social Connections: Not on file   Additional Social History:   Sleep: Good  Appetite:  Good  Current Medications: Current Facility-Administered Medications  Medication Dose Route Frequency Provider Last Rate Last Admin   acetaminophen (TYLENOL) tablet 650 mg  650 mg Oral Q6H PRN Eligha Bridegroom, NP   650 mg at 03/25/22 0801   alum & mag hydroxide-simeth (MAALOX/MYLANTA) 200-200-20 MG/5ML suspension 30 mL  30 mL Oral Q4H PRN Eligha Bridegroom, NP       gabapentin (NEURONTIN) capsule 300 mg  300 mg Oral TID Starleen Blue, NP       hydrOXYzine (ATARAX) tablet 25 mg  25 mg Oral TID PRN Eligha Bridegroom, NP   25 mg at 03/24/22 2142   magnesium hydroxide (MILK OF MAGNESIA) suspension 30 mL  30 mL Oral Daily PRN Eligha Bridegroom, NP       multivitamin with minerals tablet 1 tablet  1 tablet Oral Daily Kimyah Frein, NP   1 tablet at 03/25/22 0757   nicotine (NICODERM CQ - dosed in mg/24 hours) patch 14 mg  14 mg Transdermal Daily Onuoha, Chinwendu V, NP   14 mg at 03/25/22 0757   nitrofurantoin (macrocrystal-monohydrate) (MACROBID) capsule 100 mg  100 mg Oral Q12H Mia Milan, NP   100 mg at 03/25/22 0757   sertraline (ZOLOFT) tablet 100 mg  100 mg Oral Daily Oneta Rack, NP   100 mg at 03/25/22 0757   thiamine tablet 100 mg  100 mg Oral Daily Paw Karstens, Tyler Aas, NP   100 mg at 03/25/22 0757   traZODone (DESYREL) tablet 100 mg  100 mg Oral QHS PRN Starleen Blue, NP        Lab Results:  No results found for this or any previous visit (from the past 48 hour(s)).   Blood Alcohol level:  Lab Results  Component Value Date   ETH 178 (H) 03/19/2022   ETH 202 (H) 03/14/2022    Metabolic Disorder Labs: Lab Results  Component Value Date   HGBA1C 5.7 (H) 03/22/2022   MPG 116.89 03/22/2022   No results found for: "PROLACTIN" Lab Results  Component Value Date   CHOL 223 (H) 03/22/2022   TRIG 102 03/22/2022    HDL 91 03/22/2022   CHOLHDL 2.5 03/22/2022   VLDL 20 03/22/2022   LDLCALC 112 (H) 03/22/2022    Physical Findings: AIMS:  , ,  ,  ,    CIWA:  CIWA-Ar Total: 0 COWS:     Musculoskeletal: Strength & Muscle Tone: within normal limits Gait & Station: normal Patient leans: N/A  Psychiatric Specialty Exam:  Presentation  General Appearance: Appropriate for Environment; Fairly Groomed  Eye Contact:Fair  Speech:Clear and Coherent  Speech Volume:Normal  Handedness:Right  Mood and Affect  Mood:Anxious  Affect:Congruent   Thought Process  Thought Processes:Coherent  Descriptions of Associations:Intact  Orientation:Full (Time, Place and Person)  Thought Content:Logical  History of Schizophrenia/Schizoaffective disorder:No data recorded Duration of Psychotic Symptoms:No data recorded Hallucinations:Hallucinations: None   Ideas of Reference:None  Suicidal Thoughts:Suicidal Thoughts: No   Homicidal Thoughts:Homicidal Thoughts: No    Sensorium  Memory:Immediate Good  Judgment:Fair  Insight:Fair   Executive Functions  Concentration:Fair  Attention Span:Fair  Recall:Fair  Fund of Knowledge:Fair  Language:Fair  Psychomotor Activity  Psychomotor Activity:Psychomotor Activity: Normal   Assets  Assets:Communication Skills  Sleep  Sleep:Sleep: Poor   Physical Exam: Physical Exam Constitutional:      Appearance: Normal appearance.  HENT:     Head: Normocephalic.     Nose: Nose normal. No congestion or rhinorrhea.  Eyes:     Pupils: Pupils are equal, round, and reactive to light.  Pulmonary:     Effort: Pulmonary effort is normal.  Musculoskeletal:     Cervical back: Normal range of motion.  Neurological:     Mental Status: She is alert and oriented to person, place, and time.     Sensory: No sensory deficit.     Coordination: Coordination normal.     Deep Tendon Reflexes: Reflexes normal.  Psychiatric:        Behavior: Behavior  normal.    Review of Systems  Constitutional: Negative.  Negative for fever.  HENT: Negative.    Eyes: Negative.   Respiratory: Negative.    Cardiovascular: Negative.   Gastrointestinal: Negative.   Genitourinary: Negative.   Musculoskeletal: Negative.   Skin: Negative.   Neurological: Negative.   Endo/Heme/Allergies: Negative.   Psychiatric/Behavioral:  Positive for depression and substance abuse. Negative for hallucinations, memory loss and suicidal ideas. The patient is nervous/anxious and has insomnia.    Blood pressure 104/77, pulse (!) 101, temperature 98.8 F (37.1 C), resp. rate 16, height 5\' 2"  (1.575 m), weight 66.7 kg, last menstrual period 05/17/2014, SpO2 99 %. Body mass index is 26.89 kg/m.  Treatment Plan Summary: Daily contact with patient to assess and evaluate symptoms and progress in treatment and Medication management   Observation Level/Precautions:  15 minute checks  Laboratory:  Labs reviewed   Psychotherapy:  Unit Group sessions  Medications:  See [facility] [place]  Consultations:  To be determined   Discharge Concerns:  Safety, medication compliance, mood stability  Estimated LOS: 5-7 days  Other:  N/A    PLAN Safety and Monitoring: Voluntary admission to inpatient psychiatric unit for safety, stabilization and treatment Daily contact with patient to assess and evaluate symptoms and progress in treatment Patient's case to be discussed in multi-disciplinary team meeting Observation Level : q15 minute checks Vital signs: q12 hours Precautions: safety Monitor for signs of withdrawal Abstinence from substances encouraged  SW to look into options for outpatient SA treatment at discharge    Long Term Goal(s): Improvement in symptoms so as ready for discharge   Short Term Goals: Ability to identify changes in lifestyle to reduce recurrence of condition will improve, Ability to disclose and discuss suicidal ideas, Ability to demonstrate self-control will improve,  Ability to identify and develop effective coping behaviors will improve, Ability to maintain clinical measurements within normal limits will improve, and Ability to identify triggers associated with substance abuse/mental health issues will improve   Diagnoses Principal Problem:   MDD (major depressive episode), single episode, severe, no psychosis (HCC) Active Problems:   Alcohol use disorder   Anxiety state  Insomnia   Medications -Continue Ativan detox protocol for alcohol use -Continue Hydroxyzine 25 mg TID PRN for anxiety -Continue Oral thiamine and MVI replacement as per MAR -Increase Gabapentin to 300 mg TID for alcohol use d/o/anxiety -Continue Zoloft 100 mg daily for depression  -Continue Nicotine patch 14 mg daily for nicotine dependence -Increase Trazodone to 100 mg nightly PRN for insomnia   Other PRNS -Continue Tylenol 650 mg every 6 hours PRN for mild pain -Continue Maalox 30 mg every 4 hrs PRN for indigestion -Continue Imodium 2-4 mg as needed for diarrhea -Continue Milk of Magnesia as needed every 6 hrs for constipation -Continue Zofran disintegrating tabs every 6 hrs PRN for nausea    Labs Reviewed: Urine culture ordered due to large amounts of blood in urine and moderate leukocytes with rare bacteria. C/o itching in vaginal area, and started on Macrobid 100 mg BID for UTI. Lipid panel with cholesterol 223, LDL-112. Educated on healthy food choices and exercise. TSH WNL. Repeat EKG ordered for prolonged QT of 499. Hemoglobin A1C 5.7 (borderline diabetes). Educated on fact that will need PCP f/u at discharge.    Discharge Planning: Social work and case management to assist with discharge planning and identification of hospital follow-up needs prior to discharge Estimated LOS: 5-7 days Discharge Concerns: Need to establish a safety plan; Medication compliance and effectiveness Discharge Goals: Return home with outpatient referrals for mental health follow-up including  medication management/psychotherapy  Starleen Blue, NP 03/25/2022, 5:00 PMPatient ID: Theresa Rios, female   DOB: 1966/09/01, 56 y.o.   MRN: [id number]

## 2022-03-25 NOTE — Progress Notes (Signed)
   03/25/22 0000  Psych Admission Type (Psych Patients Only)  Admission Status Involuntary  Psychosocial Assessment  Patient Complaints Depression;Substance abuse  Eye Contact Fair  Facial Expression Anxious  Affect Anxious  Speech Logical/coherent  Interaction Assertive  Motor Activity Slow  Appearance/Hygiene Unremarkable  Behavior Characteristics Cooperative  Mood Depressed  Thought Process  Coherency WDL  Content WDL  Delusions WDL  Perception WDL  Hallucination None reported or observed  Judgment Impaired  Confusion None  Danger to Self  Current suicidal ideation? Denies  Danger to Others  Danger to Others None reported or observed

## 2022-03-25 NOTE — Group Note (Signed)

## 2022-03-25 NOTE — Group Note (Signed)
Recreation Therapy Group Note   Group Topic:Stress Management  Group Date: 03/25/2022 Start Time: 0930 End Time: 4481 Facilitators: Caroll Rancher, [place] Location: [address] Dayroom   Goal Area(s) Addresses:  Patient will identify positive stress management techniques. Patient will identify benefits of using stress management post d/c.  Group Description:  Meditation.  LRT played a meditation that focused seeing your higher self.  It also speaks on renewing your mind, body and spirit as you go into your day.    Affect/Mood: Appropriate and Sad   Participation Level: Engaged   Participation Quality: Independent   Behavior: Appropriate and Tearful   Speech/Thought Process: Focused   Insight: Good   Judgement: Good   Modes of Intervention: Meditation   Patient Response to Interventions:  Engaged   Education Outcome:  Acknowledges education and In group clarification offered    Clinical Observations/Individualized Feedback: Pt was bright and energetic at the start of group. At the end of the meditation, pt was tearful and crying.  Pt expressed the meditation mad her sad because "it brought me back to why she was here in the first place" which was a suicide attempt.  LRT reassured pt, it wasn't ment to make her feel that way.  Pt asked for a song to cheer her up and make her feel better.  LRT played her the song to which pt began dancing, singing and was feeling better place.    Plan: Continue to engage patient in RT group sessions 2-3x/week.   Caroll Rancher, LRT,CTRS 03/25/2022 12:50 PM

## 2022-03-25 NOTE — Progress Notes (Signed)
Patient appears anxious. Patient denies SI/HI/AVH. Pt goal for the day is to "drink more water".  Patient complied with morning medication with no reported side effects. Patient remains safe on Q62min checks and contracts for safety.      03/25/22 0906  Psych Admission Type (Psych Patients Only)  Admission Status Involuntary  Psychosocial Assessment  Patient Complaints Depression;Anxiety  Eye Contact Fair  Facial Expression Anxious;Animated  Affect Anxious  Speech Logical/coherent  Interaction Assertive  Motor Activity Slow  Appearance/Hygiene Unremarkable  Behavior Characteristics Cooperative  Mood Anxious  Thought Process  Coherency WDL  Content WDL  Delusions None reported or observed  Perception WDL  Hallucination None reported or observed  Judgment Impaired  Confusion None  Danger to Self  Current suicidal ideation? Denies  Danger to Others  Danger to Others None reported or observed

## 2022-03-26 MED ORDER — IBUPROFEN 400 MG PO TABS
400.0000 mg | ORAL_TABLET | Freq: Four times a day (QID) | ORAL | Status: DC | PRN
  Administered 2022-03-26 (×2): 400 mg via ORAL
  Filled 2022-03-26 (×2): qty 1

## 2022-03-26 MED ORDER — GABAPENTIN 300 MG PO CAPS
600.0000 mg | ORAL_CAPSULE | Freq: Every day | ORAL | Status: DC
  Filled 2022-03-26: qty 2

## 2022-03-26 MED ORDER — GABAPENTIN 300 MG PO CAPS
300.0000 mg | ORAL_CAPSULE | Freq: Every day | ORAL | Status: DC
  Administered 2022-03-27: 300 mg via ORAL
  Filled 2022-03-26 (×2): qty 1

## 2022-03-26 NOTE — Plan of Care (Signed)
  Problem: Education: Goal: Knowledge of [facility] Education information/materials will improve Outcome: Progressing Goal: Emotional status will improve Outcome: Progressing Goal: Mental status will improve Outcome: Progressing Goal: Verbalization of understanding the information provided will improve Outcome: Progressing   Problem: Activity: Goal: Interest or engagement in activities will improve Outcome: Progressing Goal: Sleeping patterns will improve Outcome: Progressing   

## 2022-03-26 NOTE — Progress Notes (Signed)
[facility] MD Progress Note  03/26/2022 3:33 PM Theresa Rios  MRN:  [id number]  Reason For Admission: Theresa Rios is a 56 y.o [place] female with no prior mental health diagnoses & a history of ETOH abuse who was taken to the [place] ED by EMS after suspected overdose on Melatonin. She was medically cleared and transferred to this [facility] for treatment and stabilization of her mood.   24 hr Chart Review: Pt's chart reviewed, his case discussed with his treatment team. Pt slept for  4.5 hrs last night as per nursing flow sheets. She has been compliant with scheduled medications, and has been participating in some unit activities over the past 24 hrs. As per nursing documentation, she has been cooperative & engaged, but also  anxious & animated over the past 24 hrs. V/S for the last 24 hrs are WNL. She required a dose of Trazodone 100 mg last night for insomnia.  Today's patient assessment note: On assessment today, pt presents with a euthymic mood & affect is congruent. Her attention to personal hygiene and grooming is fair, eye contact is good, speech is clear & coherent. Thoughts are organized and logical, and pt currently denies SI/HI/AVH or paranoia. There is no evidence of delusional thoughts.    Pt reports that her sleep quality last night was good, but as per flow sheets, she only slept for 4.5 hrs. She continues to report back pain and states that Tylenol 650 mg is not effective in alleviating the pain. Orders placed for Ibuprofen 400 mg PRN Q 6 H for moderate pain. Pt denies any medication related side effects, and states that she still plans on going to "[employer]" to get a job, and to get a motel room once she discharges.  She states that her boyfriend will get a motel room for her tomorrow after discharge. CSW will be made aware so that f/u appointments can be in place. We are continuing her PRN Trazodone to 150 mg nightly for insomnia. Will change  Gabapentin to 300 mg in the  mornings and 600 mg nightly so that it can also help with insomnia as well as anxiety. Will continue other medications as below.   Principal Problem: MDD (major depressive episode), single episode, severe, no psychosis ([place]) Diagnosis: Principal Problem:   MDD (major depressive episode), single episode, severe, no psychosis ([place]) Active Problems:   Alcohol use disorder   Anxiety state   Insomnia  Total Time spent with patient: 30 minutes  Past Psychiatric History: As above  Past Medical History:  Past Medical History:  Diagnosis Date   Seizures ([place])    History reviewed. No pertinent surgical history. Family History: History reviewed. No pertinent family history. Family Psychiatric  History: none reported Social History:  Social History   Substance and Sexual Activity  Alcohol Use Yes   Comment: 4-5 beers a day most days     Social History   Substance and Sexual Activity  Drug Use Yes   Types: Cocaine, Marijuana   Comment: last marijuana use: 2 mos; last cocaine use: 3 days ago    Social History   Socioeconomic History   Marital status: Soil scientist    Spouse name: Not on file   Number of children: 0   Years of education: Not on file   Highest education level: Not on file  Occupational History   Not on file  Tobacco Use   Smoking status: Every Day    Packs/day: 0.50  Types: Cigarettes   Smokeless tobacco: Never  Vaping Use   Vaping Use: Never used  Substance and Sexual Activity   Alcohol use: Yes    Comment: 4-5 beers a day most days   Drug use: Yes    Types: Cocaine, Marijuana    Comment: last marijuana use: 2 mos; last cocaine use: 3 days ago   Sexual activity: Not on file  Other Topics Concern   Not on file  Social History Narrative   Pt lives in [place] with a friend. Her husband is living with his mother. Pt recently lost her job and her place to stay.   Social Determinants of Health   Financial Resource Strain: Not on file  Food  Insecurity: Not on file  Transportation Needs: Not on file  Physical Activity: Not on file  Stress: Not on file  Social Connections: Not on file   Additional Social History:   Sleep: Good  Appetite:  Good  Current Medications: Current Facility-Administered Medications  Medication Dose Route Frequency Provider Last Rate Last Admin   acetaminophen (TYLENOL) tablet 650 mg  650 mg Oral Q6H PRN Eligha Bridegroom, NP   650 mg at 03/26/22 0805   alum & mag hydroxide-simeth (MAALOX/MYLANTA) 200-200-20 MG/5ML suspension 30 mL  30 mL Oral Q4H PRN Eligha Bridegroom, NP       [START ON 03/27/2022] gabapentin (NEURONTIN) capsule 300 mg  300 mg Oral Daily Starleen Blue, NP       [START ON 03/27/2022] gabapentin (NEURONTIN) capsule 600 mg  600 mg Oral QHS Tajuanna Burnett, NP       hydrOXYzine (ATARAX) tablet 25 mg  25 mg Oral TID PRN Eligha Bridegroom, NP   25 mg at 03/25/22 2026   ibuprofen (ADVIL) tablet 400 mg  400 mg Oral Q6H PRN Starleen Blue, NP   400 mg at 03/26/22 1159   magnesium hydroxide (MILK OF MAGNESIA) suspension 30 mL  30 mL Oral Daily PRN Eligha Bridegroom, NP       multivitamin with minerals tablet 1 tablet  1 tablet Oral Daily Edna Grover, NP   1 tablet at 03/26/22 6644   nicotine (NICODERM CQ - dosed in mg/24 hours) patch 14 mg  14 mg Transdermal Daily Onuoha, Chinwendu V, NP   14 mg at 03/26/22 0803   nitrofurantoin (macrocrystal-monohydrate) (MACROBID) capsule 100 mg  100 mg Oral Q12H Owen Pagnotta, NP   100 mg at 03/26/22 0803   sertraline (ZOLOFT) tablet 100 mg  100 mg Oral Daily Oneta Rack, NP   100 mg at 03/26/22 0802   thiamine tablet 100 mg  100 mg Oral Daily Lyriq Finerty, NP   100 mg at 03/26/22 0802   traZODone (DESYREL) tablet 100 mg  100 mg Oral QHS PRN Starleen Blue, NP   100 mg at 03/25/22 2026    Lab Results:  No results found for this or any previous visit (from the past 48 hour(s)).   Blood Alcohol level:  Lab Results  Component Value Date   ETH 178  (H) 03/19/2022   ETH 202 (H) 03/14/2022    Metabolic Disorder Labs: Lab Results  Component Value Date   HGBA1C 5.7 (H) 03/22/2022   MPG 116.89 03/22/2022   No results found for: "PROLACTIN" Lab Results  Component Value Date   CHOL 223 (H) 03/22/2022   TRIG 102 03/22/2022   HDL 91 03/22/2022   CHOLHDL 2.5 03/22/2022   VLDL 20 03/22/2022   LDLCALC 112 (H) 03/22/2022  Physical Findings: AIMS:  , ,  ,  ,    CIWA:  CIWA-Ar Total: 0 COWS:     Musculoskeletal: Strength & Muscle Tone: within normal limits Gait & Station: normal Patient leans: N/A  Psychiatric Specialty Exam:  Presentation  General Appearance: Appropriate for Environment; Fairly Groomed  Eye Contact:Good  Speech:Clear and Coherent  Speech Volume:Normal  Handedness:Right   Mood and Affect  Mood:Euthymic  Affect:Appropriate; Congruent   Thought Process  Thought Processes:Coherent  Descriptions of Associations:Intact  Orientation:Full (Time, Place and Person)  Thought Content:Logical  History of Schizophrenia/Schizoaffective disorder:No data recorded Duration of Psychotic Symptoms:No data recorded Hallucinations:Hallucinations: None   Ideas of Reference:None  Suicidal Thoughts:Suicidal Thoughts: No   Homicidal Thoughts:Homicidal Thoughts: No    Sensorium  Memory:Immediate Good  Judgment:Good  Insight:Good   Executive Functions  Concentration:Good  Attention Span:Good  [place] of Knowledge:Good  Language:Good  Psychomotor Activity  Psychomotor Activity:Psychomotor Activity: Normal   Assets  Assets:Communication Skills  Sleep  Sleep:Sleep: Fair   Physical Exam: Physical Exam Constitutional:      Appearance: Normal appearance.  HENT:     Head: Normocephalic.     Nose: Nose normal. No congestion or rhinorrhea.  Eyes:     Pupils: Pupils are equal, round, and reactive to light.  Pulmonary:     Effort: Pulmonary effort is normal.   Musculoskeletal:     Cervical back: Normal range of motion.  Neurological:     Mental Status: She is alert and oriented to person, place, and time.     Sensory: No sensory deficit.     Coordination: Coordination normal.     Deep Tendon Reflexes: Reflexes normal.  Psychiatric:        Behavior: Behavior normal.    Review of Systems  Constitutional: Negative.  Negative for fever.  HENT: Negative.    Eyes: Negative.   Respiratory: Negative.    Cardiovascular: Negative.   Gastrointestinal: Negative.   Genitourinary: Negative.   Musculoskeletal: Negative.   Skin: Negative.   Neurological: Negative.   Endo/Heme/Allergies: Negative.   Psychiatric/Behavioral:  Positive for depression and substance abuse. Negative for hallucinations, memory loss and suicidal ideas. The patient is nervous/anxious and has insomnia.    Blood pressure 128/79, pulse 88, temperature 98.3 F (36.8 C), temperature source Oral, resp. rate 16, height 5\' 2"  (1.575 m), weight 66.7 kg, last menstrual period 05/17/2014, SpO2 98 %. Body mass index is 26.89 kg/m.  Treatment Plan Summary: Daily contact with patient to assess and evaluate symptoms and progress in treatment and Medication management   Observation Level/Precautions:  15 minute checks  Laboratory:  Labs reviewed   Psychotherapy:  Unit Group sessions  Medications:  See [facility]  Consultations:  To be determined   Discharge Concerns:  Safety, medication compliance, mood stability  Estimated LOS: 5-7 days  Other:  N/A    PLAN Safety and Monitoring: Voluntary admission to inpatient psychiatric unit for safety, stabilization and treatment Daily contact with patient to assess and evaluate symptoms and progress in treatment Patient's case to be discussed in multi-disciplinary team meeting Observation Level : q15 minute checks Vital signs: q12 hours Precautions: safety Monitor for signs of withdrawal Abstinence from substances encouraged  SW to look into  options for outpatient SA treatment at discharge    Long Term Goal(s): Improvement in symptoms so as ready for discharge   Short Term Goals: Ability to identify changes in lifestyle to reduce recurrence of condition will improve, Ability to disclose and discuss suicidal  ideas, Ability to demonstrate self-control will improve, Ability to identify and develop effective coping behaviors will improve, Ability to maintain clinical measurements within normal limits will improve, and Ability to identify triggers associated with substance abuse/mental health issues will improve   Diagnoses Principal Problem:   MDD (major depressive episode), single episode, severe, no psychosis ([facility]) Active Problems:   Alcohol use disorder   Anxiety state   Insomnia               Medications -Continue Ativan detox protocol for alcohol use -Continue Hydroxyzine 25 mg TID PRN for anxiety -Continue Oral thiamine and MVI replacement as per MAR -Change Gabapentin to 300 mg in the mornings and 600 mg nightly for alcohol use d/o/anxiety -Continue Zoloft 100 mg daily for depression  -Continue Nicotine patch 14 mg daily for nicotine dependence -Continue Trazodone 100 mg nightly PRN for insomnia   Other PRNS -Continue Tylenol 650 mg every 6 hours PRN for mild pain -Continue Maalox 30 mg every 4 hrs PRN for indigestion -Continue Imodium 2-4 mg as needed for diarrhea -Continue Milk of Magnesia as needed every 6 hrs for constipation -Continue Zofran disintegrating tabs every 6 hrs PRN for nausea    Labs Reviewed: Urine culture with multiple species growing. UA with large amounts of blood in urine and moderate leukocytes with rare bacteria. Continues to c/o itching in vaginal area especially in the mornings. We are continuing Macrobid 100 mg BID for UTI. Lipid panel with cholesterol 223, LDL-112. Educated on healthy food choices and exercise. TSH WNL. Repeat EKG ordered for prolonged QT of 499 & pending. Hemoglobin A1C 5.7  (borderline diabetes). Educated on fact that will need PCP f/u at discharge.    Discharge Planning: Social work and case management to assist with discharge planning and identification of hospital follow-up needs prior to discharge Estimated LOS: 5-7 days Discharge Concerns: Need to establish a safety plan; Medication compliance and effectiveness Discharge Goals: Return home with outpatient referrals for mental health follow-up including medication management/psychotherapy  Starleen Blue, NP 03/26/2022, 3:33 PMPatient ID: Theresa Rios, female   DOB: 1965-11-03, 56 y.o.   MRN: [id number] Patient ID: Theresa Rios, female   DOB: December 07, 1965, 56 y.o.   MRN: [id number]

## 2022-03-26 NOTE — Progress Notes (Signed)
   03/26/22 0500  Psych Admission Type (Psych Patients Only)  Admission Status Involuntary  Psychosocial Assessment  Patient Complaints Anxiety;Depression  Eye Contact Fair  Facial Expression Animated;Anxious  Affect Anxious  Speech Logical/coherent  Interaction Assertive  Motor Activity Slow  Appearance/Hygiene Unremarkable  Behavior Characteristics Cooperative  Mood Anxious  Thought Process  Coherency WDL  Content WDL  Delusions None reported or observed  Perception WDL  Hallucination None reported or observed  Judgment Impaired  Confusion None  Danger to Self  Current suicidal ideation? Denies  Danger to Others  Danger to Others None reported or observed

## 2022-03-26 NOTE — Progress Notes (Signed)
   03/26/22 0600  Sleep  Number of Hours 4.5

## 2022-03-26 NOTE — Group Note (Signed)
Recreation Therapy Group Note   Group Topic:Animal Assisted Therapy   Group Date: 03/26/2022 Start Time: 1430 End Time: 1515 Facilitators: Caroll Rancher, LRT,CTRS Location: [address] Dayroom   Animal-Assisted Activity (AAA) Program Checklist/Progress Notes Patient Eligibility Criteria Checklist & Daily Group note for Rec Tx Intervention  AAA/T Program Assumption of Risk Form signed by Patient/ or Parent Legal Guardian Yes  Patient is free of allergies or severe asthma Yes  Patient reports no fear of animals Yes  Patient reports no history of cruelty to animals Yes  Patient understands his/her participation is voluntary Yes  Patient washes hands before animal contact Yes  Patient washes hands after animal contact Yes   Affect/Mood: Anxious   Participation Level: Moderate    Clinical Observations/Individualized Feedback: Pt stated she was afraid of dogs but kept going back and forth to the dayroom.  Pt was also asking questions to the dog therapy from the hallway.    Plan: Continue to engage patient in RT group sessions 2-3x/week.   Caroll Rancher, LRT,CTRS 03/26/2022 4:01 PM

## 2022-03-26 NOTE — Progress Notes (Signed)
   03/26/22 0800  Psych Admission Type (Psych Patients Only)  Admission Status Involuntary  Psychosocial Assessment  Patient Complaints Anxiety;Depression  Eye Contact Fair  Facial Expression Anxious  Affect Anxious  Speech Logical/coherent  Interaction Assertive  Motor Activity Slow  Appearance/Hygiene Unremarkable  Behavior Characteristics Cooperative  Mood Anxious  Aggressive Behavior  Effect No apparent injury  Thought Process  Coherency WDL  Content WDL  Delusions None reported or observed  Perception WDL  Hallucination None reported or observed  Judgment Impaired  Confusion None  Danger to Self  Current suicidal ideation? Denies  Danger to Others  Danger to Others None reported or observed

## 2022-03-27 ENCOUNTER — Encounter (HOSPITAL_COMMUNITY): Payer: Self-pay

## 2022-03-27 MED ORDER — GABAPENTIN 300 MG PO CAPS: 600.0000 mg | ORAL_CAPSULE | Freq: Every day | ORAL | 0 refills | Status: DC

## 2022-03-27 MED ORDER — HYDROXYZINE HCL 25 MG PO TABS
25.0000 mg | ORAL_TABLET | Freq: Three times a day (TID) | ORAL | 0 refills | Status: DC | PRN
Start: 2022-03-27 — End: 2022-04-03

## 2022-03-27 MED ORDER — GABAPENTIN 300 MG PO CAPS: 300.0000 mg | ORAL_CAPSULE | Freq: Every morning | ORAL | 0 refills | Status: DC

## 2022-03-27 MED ORDER — NITROFURANTOIN MONOHYD MACRO 100 MG PO CAPS
100.0000 mg | ORAL_CAPSULE | Freq: Two times a day (BID) | ORAL | 0 refills | Status: AC
Start: 2022-03-27 — End: 2022-03-30

## 2022-03-27 MED ORDER — TRAZODONE HCL 100 MG PO TABS
100.0000 mg | ORAL_TABLET | Freq: Every evening | ORAL | 0 refills | Status: DC | PRN
Start: 2022-03-27 — End: 2022-05-10

## 2022-03-27 MED ORDER — SERTRALINE HCL 100 MG PO TABS: 100.0000 mg | ORAL_TABLET | Freq: Every day | ORAL | 0 refills | Status: DC

## 2022-03-27 MED ORDER — NICOTINE 14 MG/24HR TD PT24
14.0000 mg | MEDICATED_PATCH | Freq: Every day | TRANSDERMAL | 0 refills | Status: DC
Start: 2022-03-28 — End: 2022-04-03

## 2022-03-27 NOTE — Discharge Summary (Signed)
Physician Discharge Summary Note  Patient:  Theresa Rios is an 56 y.o., female MRN:  [id number] DOB:  1966-03-13 Patient phone:  [phone] (home)  Patient address:   [place] [address],  Total Time spent with patient: 30 minutes  Date of Admission:  03/21/2022 Date of Discharge: 03/27/2022  Reason for Admission: Theresa Rios is a 56 y.o [place] American female with no prior mental health diagnoses & a history of ETOH abuse who was taken to the [facility] ED by EMS after suspected overdose on Melatonin. She was medically cleared and transferred to this [facility] for treatment and stabilization of her mood.   Principal Problem: MDD (major depressive episode), single episode, severe, no psychosis (HCC) Discharge Diagnoses: Principal Problem:   MDD (major depressive episode), single episode, severe, no psychosis (HCC) Active Problems:   Alcohol use disorder   Anxiety state   Insomnia  Past Psychiatric History: As above  Past Medical History:  Past Medical History:  Diagnosis Date   Seizures (HCC)    History reviewed. No pertinent surgical history. Family History: History reviewed. No pertinent family history. Family Psychiatric  History: none reported Social History:  Social History   Substance and Sexual Activity  Alcohol Use Yes   Comment: 4-5 beers a day most days     Social History   Substance and Sexual Activity  Drug Use Yes   Types: Cocaine, Marijuana   Comment: last marijuana use: 2 mos; last cocaine use: 3 days ago    Social History   Socioeconomic History   Marital status: Media planner    Spouse name: Not on file   Number of children: 0   Years of education: Not on file   Highest education level: Not on file  Occupational History   Not on file  Tobacco Use   Smoking status: Every Day    Packs/day: 0.50    Types: Cigarettes   Smokeless tobacco: Never  Vaping Use   Vaping Use: Never used  Substance and Sexual Activity   Alcohol use:  Yes    Comment: 4-5 beers a day most days   Drug use: Yes    Types: Cocaine, Marijuana    Comment: last marijuana use: 2 mos; last cocaine use: 3 days ago   Sexual activity: Not on file  Other Topics Concern   Not on file  Social History Narrative   Pt lives in [place] with a friend. Her husband is living with his mother. Pt recently lost her job and her place to stay.   Social Determinants of Health   Financial Resource Strain: Not on file  Food Insecurity: Not on file  Transportation Needs: Not on file  Physical Activity: Not on file  Stress: Not on file  Social Connections: Not on file                                   HOSPITAL COURSE  During the patient's hospitalization, patient had extensive initial psychiatric evaluation, and follow-up psychiatric evaluations every day.  Psychiatric diagnoses provided upon initial assessment were as follows: Principal Problem:   MDD (major depressive episode), single episode, severe, no psychosis (HCC) Active Problems:   Alcohol use disorder   Anxiety state   Insomnia   Patient's psychiatric medications were adjusted on admission as follows: -Started Ativan detox protocol for alcohol use -Started Hydroxyzine 25 mg TID PRN for anxiety -Started Oral thiamine and  MVI replacement as per MAR -Started Gabapentin 100 mg TID for alcohol use d/o/anxiety -Started Zoloft 50 mg daily for depression -Started Nicotine patch 14 mg daily for nicotine dependence   During the hospitalization, other adjustments were made to the patient's psychiatric medication regimen with medications at discharge being as follows:   -Continue Hydroxyzine 25 mg TID PRN for anxiety -Continue  Gabapentin to 300 mg in the mornings and 600 mg nightly for alcohol use d/o/anxiety -Continue Zoloft 100 mg daily for depression  -Continue Nicotine patch 14 mg daily for nicotine dependence -Continue Trazodone 100 mg nightly PRN for insomnia -Continue Macrobid 100 mg x 5 doses  for UTI, then stop   Patient's care was discussed during the interdisciplinary team meeting every day during the hospitalization. The patient denies having side effects to prescribed psychiatric medication. The patient was evaluated each day by a clinical provider to ascertain response to treatment. Improvement was noted by the patient's report of decreasing symptoms, improved sleep and appetite, affect, medication tolerance, behavior, and participation in unit programming.  Patient was asked each day to complete a self inventory noting mood, mental status, pain, new symptoms, anxiety and concerns.     Symptoms were reported as significantly decreased or resolved completely by discharge. On day of discharge, the patient reports that their mood is stable. The patient denied having suicidal thoughts for more than 48 hours prior to discharge.  Patient denies having homicidal thoughts.  Patient denies having auditory hallucinations.  Patient denies any visual hallucinations or other symptoms of psychosis. The patient was motivated to continue taking medication with a goal of continued improvement in mental health.    The patient reports their target psychiatric symptoms of depression, anxiety & depression responded well to the psychiatric medications, and the patient reports overall benefit from this psychiatric hospitalization. Supportive psychotherapy was provided to the patient. The patient also participated in regular group therapy while hospitalized. Coping skills, problem solving as well as relaxation therapies were also part of the unit programming.   Labs were reviewed with the patient, and abnormal results were discussed with the patient. Cholesterol is 223, and LDL is 112. Pt has been educated on healthy food choices and exercise. Hemoglobin A1C is 5.7, and pt is prediabetic. She has been educated on the need to f/u with PCP and verbalizes understanding. Pt is being treated for a UTI, and has been  educated to take Macrobid 100 mg for 5 more doses, then stop, and if symptoms persist, she should f/u this up with her PCP as well. She has been provided with an appointment at the [facility] wellness center in November as below, but has also been educated to walk into any [facility] if there is a need to be seen sooner.  Physical Findings: AIMS: Facial and Oral Movements Muscles of Facial Expression: None, normal Lips and Perioral Area: None, normal Jaw: None, normal Tongue: None, normal,Extremity Movements Upper (arms, wrists, hands, fingers): None, normal Lower (legs, knees, ankles, toes): None, normal, Trunk Movements Neck, shoulders, hips: None, normal, Overall Severity Severity of abnormal movements (highest score from questions above): None, normal Incapacitation due to abnormal movements: None, normal Patient's awareness of abnormal movements (rate only patient's report): No Awareness, Dental Status Current problems with teeth and/or dentures?: No Does patient usually wear dentures?: No  CIWA:  CIWA-Ar Total: 2 COWS:  n/a  Musculoskeletal: Strength & Muscle Tone: within normal limits Gait & Station: normal Patient leans: N/A  Psychiatric Specialty Exam:  Presentation  General Appearance: Appropriate for Environment; Fairly Groomed  Eye Contact:Fair  Speech:Clear and Coherent  Speech Volume:Normal  Handedness:Right  Mood and Affect  Mood:Euthymic  Affect:Appropriate; Congruent  Thought Process  Thought Processes:Coherent  Descriptions of Associations:Intact  Orientation:Full (Time, Place and Person)  Thought Content:Logical  History of Schizophrenia/Schizoaffective disorder:No data recorded Duration of Psychotic Symptoms:No data recorded Hallucinations:Hallucinations: None  Ideas of Reference:None  Suicidal Thoughts:Suicidal Thoughts: No  Homicidal Thoughts:Homicidal Thoughts: No  Sensorium  Memory:Immediate  Good  Judgment:Good  Insight:Good  Executive Functions  Concentration:Good  Attention Span:Good  Recall:Good  Fund of Knowledge:Good  Language:Good  Psychomotor Activity  Psychomotor Activity:Psychomotor Activity: Normal  Assets  Assets:Communication Skills  Sleep  Sleep:Sleep: Good   Physical Exam: Physical Exam Constitutional:      Appearance: Normal appearance.  HENT:     Head: Normocephalic.     Nose: Nose normal. No congestion or rhinorrhea.  Eyes:     Pupils: Pupils are equal, round, and reactive to light.  Pulmonary:     Effort: Pulmonary effort is normal. No respiratory distress.  Musculoskeletal:        General: Normal range of motion.     Cervical back: Normal range of motion. No rigidity.  Neurological:     Mental Status: She is alert and oriented to person, place, and time.     Sensory: No sensory deficit.  Psychiatric:        Behavior: Behavior normal.        Thought Content: Thought content normal.    Review of Systems  Constitutional: Negative.   HENT: Negative.    Eyes: Negative.   Respiratory: Negative.    Cardiovascular: Negative.   Gastrointestinal: Negative.   Genitourinary: Negative.   Musculoskeletal: Negative.   Skin: Negative.   Neurological: Negative.   Endo/Heme/Allergies: Negative.   Psychiatric/Behavioral:  Positive for depression and substance abuse (Educated on the need to abstain from substances of abuse. Declined rehab for ETOH use). Negative for hallucinations, memory loss and suicidal ideas. The patient is not nervous/anxious and does not have insomnia.    Blood pressure 117/83, pulse 90, temperature 98.1 F (36.7 C), temperature source Oral, resp. rate 16, height 5\' 2"  (1.575 m), weight 66.7 kg, last menstrual period 05/17/2014, SpO2 100 %. Body mass index is 26.89 kg/m.  Social History   Tobacco Use  Smoking Status Every Day   Packs/day: 0.50   Types: Cigarettes  Smokeless Tobacco Never   Tobacco Cessation:   N/A, patient does not currently use tobacco products  Blood Alcohol level:  Lab Results  Component Value Date   ETH 178 (H) 03/19/2022   ETH 202 (H) 03/14/2022   Metabolic Disorder Labs:  Lab Results  Component Value Date   HGBA1C 5.7 (H) 03/22/2022   MPG 116.89 03/22/2022   No results found for: "PROLACTIN" Lab Results  Component Value Date   CHOL 223 (H) 03/22/2022   TRIG 102 03/22/2022   HDL 91 03/22/2022   CHOLHDL 2.5 03/22/2022   VLDL 20 03/22/2022   LDLCALC 112 (H) 03/22/2022    See Psychiatric Specialty Exam and Suicide Risk Assessment completed by Attending Physician prior to discharge.  Discharge destination:  Home  Is patient on multiple antipsychotic therapies at discharge:  No   Has Patient had three or more failed trials of antipsychotic monotherapy by history:  No  Recommended Plan for Multiple Antipsychotic Therapies: NA  Discharge Instructions     Diet - low sodium heart healthy   Complete by:  As directed    Increase activity slowly   Complete by: As directed       Allergies as of 03/27/2022   No Known Allergies      Medication List     STOP taking these medications    HYDROcodone-acetaminophen 5-325 MG tablet Commonly known as: Norco   ibuprofen 800 MG tablet Commonly known as: ADVIL       TAKE these medications      Indication  gabapentin 300 MG capsule Commonly known as: NEURONTIN Take 2 capsules (600 mg total) by mouth at bedtime.  Indication: Abuse or Misuse of Alcohol, Social Anxiety Disorder   gabapentin 300 MG capsule Commonly known as: NEURONTIN Take 1 capsule (300 mg total) by mouth in the morning.  Indication: Abuse or Misuse of Alcohol, Social Anxiety Disorder   hydrOXYzine 25 MG tablet Commonly known as: ATARAX Take 1 tablet (25 mg total) by mouth 3 (three) times daily as needed for anxiety.  Indication: Feeling Anxious   nicotine 14 mg/24hr patch Commonly known as: NICODERM CQ - dosed in mg/24 hours Place  1 patch (14 mg total) onto the skin daily for 28 days. Start taking on: March 28, 2022  Indication: Nicotine Addiction   nitrofurantoin (macrocrystal-monohydrate) 100 MG capsule Commonly known as: MACROBID Take 1 capsule (100 mg total) by mouth every 12 (twelve) hours for 5 doses.  Indication: Simple Infection of the Urinary Tract   sertraline 100 MG tablet Commonly known as: ZOLOFT Take 1 tablet (100 mg total) by mouth daily. Start taking on: March 28, 2022  Indication: Major Depressive Disorder   traZODone 100 MG tablet Commonly known as: DESYREL Take 1 tablet (100 mg total) by mouth at bedtime as needed for sleep.  Indication: Trouble Sleeping        Follow-up Information     [facility]. Go on 07/15/2022.   Why: You have an appointment to establish care for primary care services on  07/15/22 at 1:30 pm.     This appointment will be held in person. Contact information: [address] [place] [address] [phone]        [facility]. Go to.   Specialty: Behavioral Health Why: Please go to this provider to obtain an assessment for therapy and medication management services on Monday or Wednesday mornings; Arrive by 7:30 am.  Services are provided on a first come, first served basis. Contact information: [address] [place] [address] [phone]               Follow-up recommendations:   The patient is able to verbalize their individual safety plan to this provider.   # It is recommended to the patient to continue psychiatric medications as prescribed, after discharge from the hospital.     # It is recommended to the patient to follow up with your outpatient psychiatric provider and PCP.   # It was discussed with the patient, the impact of alcohol, drugs, tobacco have been there overall psychiatric and medical wellbeing, and total abstinence from substance  use was recommended the patient.ed.   # Prescriptions provided or sent directly to preferred pharmacy at discharge. Patient agreeable to plan. Given opportunity to ask questions. Appears to feel comfortable with discharge.    # In the event of worsening symptoms, the patient is instructed to call the crisis hotline (988), 911 and or go to the nearest ED for appropriate evaluation and treatment  of symptoms. To follow-up with primary care provider for other medical issues, concerns and or health care needs   # Patient was discharged home with a plan to follow up as noted above.    Signed: Starleen Blue, NP 03/27/2022, 2:13 PM

## 2022-03-27 NOTE — Progress Notes (Signed)
  [facility] Adult Case Management Discharge Plan :  Will you be returning to the same living situation after discharge:  No. Home with Husband  At discharge, do you have transportation home?: Yes,  Taxi Do you have the ability to pay for your medications: Yes,  Community Support  Release of information consent forms completed and in the chart;  Patient's signature needed at discharge.  Patient to Follow up at:  Follow-up Information     [facility]. Go on 07/15/2022.   Why: You have an appointment to establish care for primary care services on  07/15/22 at 1:30 pm.     This appointment will be held in person. Contact information: [address] [place] [address] [phone]        [facility] Go to.   Specialty: Behavioral Health Why: Please go to this provider to obtain an assessment for therapy and medication management services on Monday or Wednesday mornings; Arrive by 7:30 am.  Services are provided on a first come, first served basis. Contact information: [address] [place] [address] [phone]                Next level of care provider has access to [facility] Link:yes  Safety Planning and Suicide Prevention discussed: Yes,  with patient      Has patient been referred to the Quitline?: Patient refused referral  Patient has been referred for addiction treatment: Pt. refused referral  Aram Beecham, LCSWA 03/27/2022, 9:42 AM

## 2022-03-27 NOTE — BH IP Treatment Plan (Signed)
Interdisciplinary Treatment and Diagnostic Plan Update  03/27/2022 Time of Session: 10:20am  Theresa Rios MRN: [id number]  Principal Diagnosis: MDD (major depressive episode), single episode, severe, no psychosis (HCC)  Secondary Diagnoses: Principal Problem:   MDD (major depressive episode), single episode, severe, no psychosis (HCC) Active Problems:   Alcohol use disorder   Anxiety state   Insomnia   Current Medications:  Current Facility-Administered Medications  Medication Dose Route Frequency Provider Last Rate Last Admin   acetaminophen (TYLENOL) tablet 650 mg  650 mg Oral Q6H PRN Eligha Bridegroom, NP   650 mg at 03/26/22 0805   alum & mag hydroxide-simeth (MAALOX/MYLANTA) 200-200-20 MG/5ML suspension 30 mL  30 mL Oral Q4H PRN Eligha Bridegroom, NP       gabapentin (NEURONTIN) capsule 300 mg  300 mg Oral Daily Nkwenti, Doris, NP   300 mg at 03/27/22 0726   gabapentin (NEURONTIN) capsule 600 mg  600 mg Oral QHS Nkwenti, Doris, NP       hydrOXYzine (ATARAX) tablet 25 mg  25 mg Oral TID PRN Eligha Bridegroom, NP   25 mg at 03/27/22 6295   ibuprofen (ADVIL) tablet 400 mg  400 mg Oral Q6H PRN Starleen Blue, NP   400 mg at 03/26/22 2026   magnesium hydroxide (MILK OF MAGNESIA) suspension 30 mL  30 mL Oral Daily PRN Eligha Bridegroom, NP       multivitamin with minerals tablet 1 tablet  1 tablet Oral Daily Nkwenti, Doris, NP   1 tablet at 03/27/22 0727   nicotine (NICODERM CQ - dosed in mg/24 hours) patch 14 mg  14 mg Transdermal Daily Onuoha, Chinwendu V, NP   14 mg at 03/27/22 2841   nitrofurantoin (macrocrystal-monohydrate) (MACROBID) capsule 100 mg  100 mg Oral Q12H Nkwenti, Doris, NP   100 mg at 03/27/22 3244   sertraline (ZOLOFT) tablet 100 mg  100 mg Oral Daily Oneta Rack, NP   100 mg at 03/27/22 0102   thiamine tablet 100 mg  100 mg Oral Daily Nkwenti, Tyler Aas, NP   100 mg at 03/27/22 7253   traZODone (DESYREL) tablet 100 mg  100 mg Oral QHS PRN Starleen Blue, NP   100 mg at  03/26/22 2027   PTA Medications: Medications Prior to Admission  Medication Sig Dispense Refill Last Dose   HYDROcodone-acetaminophen (NORCO) 5-325 MG tablet Take 1 tablet by mouth every 6 (six) hours as needed for severe pain. (Patient not taking: Reported on 03/19/2022) 10 tablet 0    ibuprofen (ADVIL) 800 MG tablet Take 800 mg by mouth 3 (three) times daily as needed for moderate pain.       Patient Stressors: Financial difficulties   Occupational concerns   Substance abuse    Patient Strengths: Average or above average intelligence  Motivation for treatment/growth  Supportive family/friends   Treatment Modalities: Medication Management, Group therapy, Case management,  1 to 1 session with clinician, Psychoeducation, Recreational therapy.   Physician Treatment Plan for Primary Diagnosis: MDD (major depressive episode), single episode, severe, no psychosis (HCC) Long Term Goal(s): Improvement in symptoms so as ready for discharge   Short Term Goals: Ability to identify changes in lifestyle to reduce recurrence of condition will improve Ability to disclose and discuss suicidal ideas Ability to demonstrate self-control will improve Ability to identify and develop effective coping behaviors will improve Ability to maintain clinical measurements within normal limits will improve Ability to identify triggers associated with substance abuse/mental health issues will improve  Medication Management: Evaluate patient's  response, side effects, and tolerance of medication regimen.  Therapeutic Interventions: 1 to 1 sessions, Unit Group sessions and Medication administration.  Evaluation of Outcomes: Adequate for Discharge  Physician Treatment Plan for Secondary Diagnosis: Principal Problem:   MDD (major depressive episode), single episode, severe, no psychosis (HCC) Active Problems:   Alcohol use disorder   Anxiety state   Insomnia  Long Term Goal(s): Improvement in symptoms so as  ready for discharge   Short Term Goals: Ability to identify changes in lifestyle to reduce recurrence of condition will improve Ability to disclose and discuss suicidal ideas Ability to demonstrate self-control will improve Ability to identify and develop effective coping behaviors will improve Ability to maintain clinical measurements within normal limits will improve Ability to identify triggers associated with substance abuse/mental health issues will improve     Medication Management: Evaluate patient's response, side effects, and tolerance of medication regimen.  Therapeutic Interventions: 1 to 1 sessions, Unit Group sessions and Medication administration.  Evaluation of Outcomes: Adequate for Discharge   RN Treatment Plan for Primary Diagnosis: MDD (major depressive episode), single episode, severe, no psychosis (HCC) Long Term Goal(s): Knowledge of disease and therapeutic regimen to maintain health will improve  Short Term Goals: Ability to remain free from injury will improve, Ability to participate in decision making will improve, Ability to verbalize feelings will improve, Ability to disclose and discuss suicidal ideas, and Ability to identify and develop effective coping behaviors will improve  Medication Management: RN will administer medications as ordered by provider, will assess and evaluate patient's response and provide education to patient for prescribed medication. RN will report any adverse and/or side effects to prescribing provider.  Therapeutic Interventions: 1 on 1 counseling sessions, Psychoeducation, Medication administration, Evaluate responses to treatment, Monitor vital signs and CBGs as ordered, Perform/monitor CIWA, COWS, AIMS and Fall Risk screenings as ordered, Perform wound care treatments as ordered.  Evaluation of Outcomes: Adequate for Discharge   LCSW Treatment Plan for Primary Diagnosis: MDD (major depressive episode), single episode, severe, no  psychosis (HCC) Long Term Goal(s): Safe transition to appropriate next level of care at discharge, Engage patient in therapeutic group addressing interpersonal concerns.  Short Term Goals: Engage patient in aftercare planning with referrals and resources, Increase social support, Increase emotional regulation, Facilitate acceptance of mental health diagnosis and concerns, Identify triggers associated with mental health/substance abuse issues, and Increase skills for wellness and recovery  Therapeutic Interventions: Assess for all discharge needs, 1 to 1 time with Social worker, Explore available resources and support systems, Assess for adequacy in community support network, Educate family and significant other(s) on suicide prevention, Complete Psychosocial Assessment, Interpersonal group therapy.  Evaluation of Outcomes: Adequate for Discharge   Progress in Treatment: Attending groups: Yes. Participating in groups: Yes. Taking medication as prescribed: Yes. Toleration medication: Yes. Family/Significant other contact made: No, will contact:  CSW will make additional attempts to reach [facility], husband.  Patient understands diagnosis: Yes. Discussing patient identified problems/goals with staff: Yes. Medical problems stabilized or resolved: Yes. Denies suicidal/homicidal ideation: No. Issues/concerns per patient self-inventory: Yes. Other: none    New problem(s) identified: No, Describe:  none   New Short Term/Long Term Goal(s): Patient to work towards detox, elimination of symptoms of psychosis, medication management for mood stabilization; elimination of SI thoughts; development of comprehensive mental wellness/sobriety plan.   Patient Goals:  Patient states her treatment goals is to improve "my behavior for one and learn as much as I can."    Discharge  Plan or Barriers: Pt will return home with husband and will follow up with [facility] for [facility]CP and [facility] for therapy  and medication management    Reason for Continuation of Hospitalization: Medication Management    Estimated Length of Stay:Adequate for discharge    Last 3 [place] Suicide Severity Risk Score: Flowsheet Row [facility] ([facility]2022 [facility] ED from 03/14/2022 in [facility]  C-SSRS RISK CATEGORY High Risk No Risk No Risk       Last PHQ 2/9 Scores:     No data to display          Scribe for Treatment Team: Aram Beecham, Theresia Majors 03/27/2022 10:40 AM

## 2022-03-27 NOTE — Group Note (Signed)
Recreation Therapy Group Note   Group Topic:Stress Management  Group Date: 03/27/2022 Start Time: 0930 End Time: 1000 Facilitators: Caroll Rancher, LRT,CTRS Location: [address] Dayroom   Goal Area(s) Addresses:  Patient will actively participate in stress management techniques presented during session.  Patient will successfully identify benefit of practicing stress management post d/c.   Group Description: Guided Imagery. LRT provided education, instruction, and demonstration on practice of visualization via guided imagery. Patient was asked to participate in the technique introduced during session. LRT debriefed including topics of mindfulness, stress management and specific scenarios each patient could use these techniques. Patients were given suggestions of ways to access scripts post d/c and encouraged to explore Youtube and other apps available on smartphones, tablets, and computers.  Clinical Observations/Individualized Feedback: Unable to conduct group due to orientation group running over 20 minutes into recreation therapy group time.   Plan: Continue to engage patient in RT group sessions 2-3x/week.   Caroll Rancher, LRT,CTRS 03/27/2022 1:08 PM

## 2022-03-27 NOTE — [facility] Suicide Risk Assessment (Signed)
Suicide Risk Assessment  Discharge Assessment    [facility] Discharge Suicide Risk Assessment   Principal Problem: MDD (major depressive episode), single episode, severe, no psychosis (HCC) Discharge Diagnoses: Principal Problem:   MDD (major depressive episode), single episode, severe, no psychosis (HCC) Active Problems:   Alcohol use disorder   Anxiety state   Insomnia  Reason For Admission: Theresa Rios is a 56 y.o [place] American female with no prior mental health diagnoses & a history of ETOH abuse who was taken to the [facility] ED by EMS after suspected overdose on Melatonin. She was medically cleared and transferred to this [facility] for treatment and stabilization of her mood.                                  HOSPITAL COURSE  During the patient's hospitalization, patient had extensive initial psychiatric evaluation, and follow-up psychiatric evaluations every day.  Psychiatric diagnoses provided upon initial assessment were as follows: Principal Problem:   MDD (major depressive episode), single episode, severe, no psychosis (HCC) Active Problems:   Alcohol use disorder   Anxiety state   Insomnia  Patient's psychiatric medications were adjusted on admission as follows: -Started Ativan detox protocol for alcohol use -Started Hydroxyzine 25 mg TID PRN for anxiety -Started Oral thiamine and MVI replacement as per MAR -Started Gabapentin 100 mg TID for alcohol use d/o/anxiety -Started Zoloft 50 mg daily for depression -Started Nicotine patch 14 mg daily for nicotine dependence  During the hospitalization, other adjustments were made to the patient's psychiatric medication regimen with medications at discharge being as follows:  -Continue Hydroxyzine 25 mg TID PRN for anxiety -Continue  Gabapentin to 300 mg in the mornings and 600 mg nightly for alcohol use d/o/anxiety -Continue Zoloft 100 mg daily for depression  -Continue Nicotine patch 14 mg daily for nicotine  dependence -Continue Trazodone 100 mg nightly PRN for insomnia -Continue Macrobid 100 mg x 5 doses for UTI, then stop  Patient's care was discussed during the interdisciplinary team meeting every day during the hospitalization. The patient denies having side effects to prescribed psychiatric medication. The patient was evaluated each day by a clinical provider to ascertain response to treatment. Improvement was noted by the patient's report of decreasing symptoms, improved sleep and appetite, affect, medication tolerance, behavior, and participation in unit programming.  Patient was asked each day to complete a self inventory noting mood, mental status, pain, new symptoms, anxiety and concerns.    Symptoms were reported as significantly decreased or resolved completely by discharge. On day of discharge, the patient reports that their mood is stable. The patient denied having suicidal thoughts for more than 48 hours prior to discharge.  Patient denies having homicidal thoughts.  Patient denies having auditory hallucinations.  Patient denies any visual hallucinations or other symptoms of psychosis. The patient was motivated to continue taking medication with a goal of continued improvement in mental health.   The patient reports their target psychiatric symptoms of depression, anxiety & depression responded well to the psychiatric medications, and the patient reports overall benefit from this psychiatric hospitalization. Supportive psychotherapy was provided to the patient. The patient also participated in regular group therapy while hospitalized. Coping skills, problem solving as well as relaxation therapies were also part of the unit programming.  Labs were reviewed with the patient, and abnormal results were discussed with the patient. Cholesterol is 223, and LDL is 112. Pt has been  educated on healthy food choices and exercise. Hemoglobin A1C is 5.7, and pt is prediabetic. She has been educated on the  need to f/u with PCP and verbalizes understanding. Pt is being treated for a UTI, and has been educated to take Macrobid 100 mg for 5 more doses, then stop, and if symptoms persist, she should f/u this up with her PCP as well. She has been provided with an appointment at the [facility] health [facility] wellness center in November as below, but has also been educated to walk into any [facility] if there is a need to be seen sooner.  Total Time spent with patient: 30 minutes  Musculoskeletal: Strength & Muscle Tone: within normal limits Gait & Station: normal Patient leans: N/A  Psychiatric Specialty Exam  Presentation  General Appearance: Appropriate for Environment; Fairly Groomed  Eye Contact:Fair  Speech:Clear and Coherent  Speech Volume:Normal  Handedness:Right  Mood and Affect  Mood:Euthymic  Duration of Depression Symptoms: No data recorded Affect:Appropriate; Congruent  Thought Process  Thought Processes:Coherent  Descriptions of Associations:Intact  Orientation:Full (Time, Place and Person)  Thought Content:Logical  History of Schizophrenia/Schizoaffective disorder:No data recorded Duration of Psychotic Symptoms:No data recorded Hallucinations:Hallucinations: None  Ideas of Reference:None  Suicidal Thoughts:Suicidal Thoughts: No  Homicidal Thoughts:Homicidal Thoughts: No   Sensorium  Memory:Immediate Good  Judgment:Good  Insight:Good   Executive Functions  Concentration:Good  Attention Span:Good  Recall:Good  Fund of Knowledge:Good  Language:Good   Psychomotor Activity  Psychomotor Activity:Psychomotor Activity: Normal   Assets  Assets:Communication Skills   Sleep  Sleep:Sleep: Good   Physical Exam: Physical Exam Constitutional:      Appearance: Normal appearance.  HENT:     Head: Normocephalic.     Nose: Nose normal. No congestion or rhinorrhea.  Eyes:     Pupils: Pupils are equal, round, and reactive to light.  Pulmonary:      Effort: Pulmonary effort is normal. No respiratory distress.  Musculoskeletal:        General: Normal range of motion.     Cervical back: Normal range of motion.  Neurological:     Mental Status: She is alert and oriented to person, place, and time.     Sensory: No sensory deficit.  Psychiatric:        Behavior: Behavior normal.        Thought Content: Thought content normal.    Review of Systems  Constitutional: Negative.  Negative for fever.  HENT: Negative.    Eyes: Negative.   Respiratory: Negative.    Cardiovascular: Negative.   Gastrointestinal: Negative.   Genitourinary: Negative.   Musculoskeletal: Negative.   Skin: Negative.   Neurological: Negative.   Endo/Heme/Allergies: Negative.   Psychiatric/Behavioral:  Positive for depression (Patient denies SI/HI/AVH, denies paranoia and verbally contracts for safety outside of this [facility]) and substance abuse (Educated on the need to abstain from alcohol and verbalizes understanding. Not interested in substance abuse rehab at this time. States she plans to go to "[employer]" at discharge, to be assisted with a job, and states that her bf will get motel room). Negative for hallucinations, memory loss and suicidal ideas. The patient is not nervous/anxious and does not have insomnia.    Blood pressure 117/83, pulse 90, temperature 98.1 F (36.7 C), temperature source Oral, resp. rate 16, height 5\' 2"  (1.575 m), weight 66.7 kg, last menstrual period 05/17/2014, SpO2 100 %. Body mass index is 26.89 kg/m.  Mental Status Per Nursing Assessment::   On Admission:  NA  Demographic Factors:  Low socioeconomic status  Loss Factors: NA  Historical Factors: Impulsivity  Risk Reduction Factors:   Living with another person, especially a relative  Continued Clinical Symptoms:  Depression:   Impulsivity Insomnia-Patient reports that her depressive symptoms are resolving & have significantly improved since admission . She  currently denies SI/HI/AVH, denies paranoia and is verbally contracting for safety outside of this [facility].  Cognitive Features That Contribute To Risk:  None    Suicide Risk:  Minimal: No identifiable suicidal ideation.  Patients presenting with no risk factors but with morbid ruminations; may be classified as minimal risk based on the severity of the depressive symptoms   Follow-up Information     [facility]. Go on 07/15/2022.   Why: You have an appointment to establish care for primary care services on  07/15/22 at 1:30 pm.     This appointment will be held in person. Contact information: [address] [place] [address] [phone]        [facility]. Go to.   Specialty: Behavioral Health Why: Please go to this provider to obtain an assessment for therapy and medication management services on Monday or Wednesday mornings; Arrive by 7:30 am.  Services are provided on a first come, first served basis. Contact information: [address] [place] [address] [phone]               Plan Of Care/Follow-up recommendations:   The patient is able to verbalize their individual safety plan to this provider.  # It is recommended to the patient to continue psychiatric medications as prescribed, after discharge from the hospital.    # It is recommended to the patient to follow up with your outpatient psychiatric provider and PCP.  # It was discussed with the patient, the impact of alcohol, drugs, tobacco have been there overall psychiatric and medical wellbeing, and total abstinence from substance use was recommended the patient.ed.  # Prescriptions provided or sent directly to preferred pharmacy at discharge. Patient agreeable to plan. Given opportunity to ask questions. Appears to feel comfortable with discharge.    # In the event of worsening symptoms, the patient is  instructed to call the crisis hotline (988), 911 and or go to the nearest ED for appropriate evaluation and treatment of symptoms. To follow-up with primary care provider for other medical issues, concerns and or health care needs  # Patient was discharged home with a plan to follow up as noted above.   Starleen Blue, NP 03/27/2022, 12:44 PM

## 2022-03-27 NOTE — Discharge Instructions (Signed)
-  Follow-up with your outpatient psychiatric provider -instructions on appointment date, time, and address (location) are provided to you in discharge paperwork.  -Take your psychiatric medications as prescribed at discharge - instructions are provided to you in the discharge paperwork  -Follow-up with outpatient primary care doctor and other specialists -for management of chronic medical disease and UTI - please go to urgent care for follow-up of UTI. You have 5 more doses of antibiotics to take for your uti.    -Recommend abstinence from alcohol, tobacco, and other illicit drug use at discharge.   -If your psychiatric symptoms recur, worsen, or if you have side effects to your psychiatric medications, call your outpatient psychiatric provider, 911, [phone] or go to the nearest emergency department.  -If suicidal thoughts recur, call your outpatient psychiatric provider, 911, [phone] or go to the nearest emergency department.

## 2022-03-27 NOTE — Progress Notes (Signed)
Discharge note: SSP complete. RN met with pt and reviewed pt's discharge instructions. Pt verbalized understanding of discharge instructions and pt did not have any questions. RN reviewed and provided pt with a copy of SRA, AVS and Transition Record. RN returned pt's belongings to pt. Samples were given to pt. Pt denied SI/HI/AVH and voiced no concerns. Pt was appreciative of the care pt received at [facility]. Pt extremely eager and anxious to leave. Patient discharged to the lobby without incident.   03/27/22 0728  Psych Admission Type (Psych Patients Only)  Admission Status Involuntary  Psychosocial Assessment  Patient Complaints Anxiety  Eye Contact Fair  Facial Expression Animated;Anxious  Affect Anxious;Appropriate to circumstance  Speech Logical/coherent;Rapid  Interaction Assertive  Motor Activity Other (Comment) (WDL)  Appearance/Hygiene In scrubs;Unremarkable  Behavior Characteristics Cooperative;Appropriate to situation;Anxious  Mood Anxious;Pleasant  Thought Process  Coherency WDL  Content WDL  Delusions None reported or observed  Perception WDL  Hallucination None reported or observed  Judgment Impaired  Confusion None  Danger to Self  Current suicidal ideation? Denies  Danger to Others  Danger to Others None reported or observed

## 2022-04-03 ENCOUNTER — Ambulatory Visit (INDEPENDENT_AMBULATORY_CARE_PROVIDER_SITE_OTHER): Payer: Federal, State, Local not specified - Other | Admitting: Psychiatry

## 2022-04-03 VITALS — BP 159/70 | HR 77

## 2022-04-03 DIAGNOSIS — F17201 Nicotine dependence, unspecified, in remission: Secondary | ICD-10-CM

## 2022-04-03 DIAGNOSIS — F324 Major depressive disorder, single episode, in partial remission: Secondary | ICD-10-CM | POA: Diagnosis not present

## 2022-04-03 MED ORDER — HYDROXYZINE HCL 25 MG PO TABS: 25.0000 mg | ORAL_TABLET | Freq: Three times a day (TID) | ORAL | 1 refills | Status: DC | PRN

## 2022-04-03 MED ORDER — SERTRALINE HCL 100 MG PO TABS: 100.0000 mg | ORAL_TABLET | Freq: Every day | ORAL | 1 refills | Status: DC

## 2022-04-03 MED ORDER — NICOTINE 14 MG/24HR TD PT24: 14.0000 mg | MEDICATED_PATCH | Freq: Every day | TRANSDERMAL | 1 refills | Status: DC

## 2022-04-03 NOTE — Progress Notes (Signed)
Psychiatric Initial Adult Assessment   Patient Identification: Theresa Rios MRN:  [id number] Date of Evaluation:  04/03/2022 Referral Source: [facility] Chief Complaint:  No chief complaint on file.  Visit Diagnosis:    ICD-10-CM   1. MDD (major depressive disorder), single episode, in partial remission (HCC)  F32.4 sertraline (ZOLOFT) 100 MG tablet    hydrOXYzine (ATARAX) 25 MG tablet    2. Nicotine dependence in remission, unspecified nicotine product type  F17.201 nicotine (NICODERM CQ - DOSED IN MG/24 HOURS) 14 mg/24hr patch      History of Present Illness: Patient is a 56 year old female with past psychiatric history of MDD, alcohol use disorder, anxiety and recent hospital admission for suicidal attempt by melatonin overdose presented to [facility] outpatient clinic for hospital follow-up.  Patient states that she has been doing well since discharge and her medications has been helping her a lot.  She reports that when she wakes up she feels low , sad and anxious but after taking her medications she feels happy and less depressed all day.  She reports her anxiety medication also helps her with her anxiety.  She reports not taking gabapentin as prescribed and takes 1 pill of gabapentin in the morning and and 1 pill at night.  She reports that she sometimes wake up in the middle of the night and has not been sleeping well.  Discussed that her dose of gabapentin is 1 tabs in the morning and 2 tabs at night and she should take as prescribed.  She states that she will start taking 2 tabs at night.  She reports stable appetite.  She endorses low energy, fatigue, poor memory, poor concentration, helplessness, and worthlessness sometimes.  She denies anhedonia and hopelessness.  She reports some irritability.  She denies any anxiety today as she took her medicine this morning.  Currently, she denies active or passive SI, HI, AVH.  She has been tolerating her medications well without any side effects. She  reports one recent suicidal attempt by overdosing on alcohol with melatonin and one psychiatric admission at [facility] recently.  She was discharged on Zoloft 100 mg daily, trazodone 100 mg nightly, gabapentin 300 mg in the morning and 600 mg at night, Vistaril 25 mg 3 times daily as needed for anxiety, and nicotine patch 14 mg daily.  She reports that she got some samples from [facility] and has been taking her medications regularly but has not picked up medications from pharmacy. She denies any medical illnesses but c/o of tooth ache and pain in right hand.  Discussed that she needs to follow-up with PCP and dentist.  She verbalizes understanding.  She denies any allergies. She reports that she was drinking 4-5 beers per day before her recent admission but she has cut down on alcohol and now drinks occasionally 1-2 beer only.  Encouraged complete cessation of alcohol.  She verbalizes understanding. She is smokes half a pack of cigarettes per day and wants to continue nicotine patch.  She lives with her partner.  She has been with him for 44 years.  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  She denies access to guns and denies any problem with Patent examiner. Patient was given referrals for PCP.   Depression Symptoms:  fatigue, feelings of worthlessness/guilt, difficulty concentrating, impaired memory, anxiety, loss of energy/fatigue, disturbed sleep, (Hypo) Manic Symptoms:  Irritable Mood, Anxiety Symptoms:  Excessive Worry, Psychotic Symptoms:  Hallucinations: None PTSD Symptoms: Negative  Past Psychiatric History: She reports one recent suicidal attempt by overdosing on alcohol with melatonin and one psychiatric admission at [facility] recently.  She was discharged on Zoloft 100 mg daily, trazodone 100 mg nightly, gabapentin 300 mg in the morning and 600 mg at night, Vistaril 25 mg 3 times daily as needed for anxiety, and nicotine  patch 14 mg daily.  She reports that she got some samples from [facility] and has been taking her medications regularly but has not picked up medications from pharmacy.  Previous Psychotropic Medications: Yes   Substance Abuse History in the last 12 months:  Yes.   Alcohol only Consequences of Substance Abuse: Medical Consequences:   Recent hospital admission due to excessive alcohol use and overdosing on melatonin  Past Medical History:  Past Medical History:  Diagnosis Date   Seizures (HCC)    No past surgical history on file.  Family Psychiatric History: Denies  Family History: No family history on file.  Social History:   Social History   Socioeconomic History   Marital status: Media planner    Spouse name: Not on file   Number of children: 0   Years of education: Not on file   Highest education level: Not on file  Occupational History   Not on file  Tobacco Use   Smoking status: Every Day    Packs/day: 0.50    Types: Cigarettes   Smokeless tobacco: Never  Vaping Use   Vaping Use: Never used  Substance and Sexual Activity   Alcohol use: Yes    Comment: 4-5 beers a day most days   Drug use: Yes    Types: Cocaine, Marijuana    Comment: last marijuana use: 2 mos; last cocaine use: 3 days ago   Sexual activity: Not on file  Other Topics Concern   Not on file  Social History Narrative   Pt lives in [place] with a friend. Her husband is living with his mother. Pt recently lost her job and her place to stay.   Social Determinants of Health   Financial Resource Strain: Not on file  Food Insecurity: Not on file  Transportation Needs: Not on file  Physical Activity: Not on file  Stress: Not on file  Social Connections: Not on file    Additional Social History: She lives with her partner.  She has been with him for 44 years.  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 She does not have any  kids.  She denies access to guns and denies any problem with law enforcement.  Allergies:  No Known Allergies  Metabolic Disorder Labs: Lab Results  Component Value Date   HGBA1C 5.7 (H) 03/22/2022   MPG 116.89 03/22/2022   No results found for: "PROLACTIN" Lab Results  Component Value Date   CHOL 223 (H) 03/22/2022   TRIG 102 03/22/2022   HDL 91 03/22/2022   CHOLHDL 2.5 03/22/2022   VLDL 20 03/22/2022   LDLCALC 112 (H) 03/22/2022   Lab Results  Component Value Date   TSH 1.567 03/22/2022    Therapeutic Level Labs: No results found for: "LITHIUM" No results found for: "CBMZ" No results found for: "VALPROATE"  Current Medications: Current Outpatient Medications  Medication Sig Dispense Refill   gabapentin (NEURONTIN) 300 MG capsule Take 2 capsules (600 mg total) by mouth at bedtime. 60 capsule 0   gabapentin (NEURONTIN) 300 MG  capsule Take 1 capsule (300 mg total) by mouth in the morning. 30 capsule 0   hydrOXYzine (ATARAX) 25 MG tablet Take 1 tablet (25 mg total) by mouth 3 (three) times daily as needed for anxiety. 90 tablet 1   nicotine (NICODERM CQ - DOSED IN MG/24 HOURS) 14 mg/24hr patch Place 1 patch (14 mg total) onto the skin daily. 28 patch 1   sertraline (ZOLOFT) 100 MG tablet Take 1 tablet (100 mg total) by mouth daily. 30 tablet 1   traZODone (DESYREL) 100 MG tablet Take 1 tablet (100 mg total) by mouth at bedtime as needed for sleep. 30 tablet 0   No current facility-administered medications for this visit.    Musculoskeletal: Strength & Muscle Tone: within normal limits Gait & Station: normal Patient leans: N/A  Psychiatric Specialty Exam: Review of Systems  Blood pressure (!) 159/70, pulse 77, last menstrual period 05/17/2014, SpO2 100 %.There is no height or weight on file to calculate BMI.  General Appearance: Casual  Eye Contact:  Good  Speech:  Clear and Coherent and Normal Rate  Volume:  Normal  Mood:  Euthymic  Affect:  Full Range  Thought  Process:  Coherent and Linear  Orientation:  Full (Time, Place, and Person)  Thought Content:  Logical  Suicidal Thoughts:  No  Homicidal Thoughts:  No  Memory:  Immediate;   Fair Recent;   Fair  Judgement:  Good  Insight:  Good  Psychomotor Activity:  Normal  Concentration:  Concentration: Fair and Attention Span: Fair  Recall:  [employer] of Knowledge:Good  Language: Good  Akathisia:  No  Handed:  Right  AIMS (if indicated):  not done  Assets:  Desire for Improvement Intimacy Resilience Social Support  ADL's:  Intact  Cognition: WNL  Sleep:  Fair   Screenings: AIMS    Flowsheet Row [facility]  AIMS Total Score 0      AUDIT    Flowsheet Row [facility]  Alcohol Use Disorder Identification Test Final Score (AUDIT) 11      PHQ2-9    Flowsheet Row Office Visit from 04/03/2022 in [place]  PHQ-2 Total Score 0      Flowsheet Row [facility]) [facility] from 03/19/2022 in [facility] ED from [facility]/14/2022 in [facility]  C-SSRS RISK CATEGORY High Risk No Risk No Risk       Assessment and Plan: Patient is a 56 year old female with history of MDD and recent hospital admission for suicidal attempt by melatonin overdose presented to [facility] outpatient clinic for hospital follow-up.  Patient reports improvement in her depression and anxiety since starting medications.  She has been tolerating her medications well without any side effects.  Patient is still reporting some difficulty with sleep.  She reported that she is not taking gabapentin as prescribed and only takes 1 pill in the morning and 1 pill at night.  Discussed continuing gabapentin in as prescribed. PHQ 2  scored at  0. MDD, single episode, in partial remission Anxiety -Continue Zoloft 100 mg daily.  30-day prescription with 1 refill sent to patient's pharmacy -Continue Vistaril 25 mg 3 times daily as needed for anxiety.  30-day prescription with 1 refill sent to patient's pharmacy  Alcohol cravings. -Encourage complete alcohol cessation -Continue gabapentin  300 mg in the morning and 600 mg at night.  Patient already has prescription at pharmacy  Nicotine dependence.  -Continue nicotine patch 14 mg daily.  28-day prescription with 1 refill sent to patient pharmacy.  Tooth ache Rt Hand pain -Given referrals for PCP. Patient was instructed to follow-up with PCP and dentist ASAP  Follow-up in 4 weeks  Collaboration of Care: Other None at this time   Patient/Guardian was advised Release of Information must be obtained prior to any record release in order to collaborate their care with an outside provider. Patient/Guardian was advised if they have not already done so to contact the registration department to sign all necessary forms in order for [place] to release information regarding their care.   Consent: Patient/Guardian gives verbal consent for treatment and assignment of benefits for services provided during this visit. Patient/Guardian expressed understanding and agreed to proceed.   Karsten Ro, MD 7/26/20238:44 AM

## 2022-04-03 NOTE — Patient Instructions (Signed)
Follow-up in 4 weeks

## 2022-04-05 ENCOUNTER — Encounter (HOSPITAL_COMMUNITY): Payer: Self-pay | Admitting: Psychiatry

## 2022-04-11 ENCOUNTER — Inpatient Hospital Stay (HOSPITAL_COMMUNITY)
Admission: EM | Admit: 2022-04-11 | Discharge: 2022-04-13 | DRG: 101 | Disposition: A | Discharge status: Home / Self Care | Payer: Self-pay | Attending: Internal Medicine | Admitting: Internal Medicine

## 2022-04-11 ENCOUNTER — Encounter (HOSPITAL_COMMUNITY): Payer: Self-pay

## 2022-04-11 ENCOUNTER — Other Ambulatory Visit: Payer: Self-pay

## 2022-04-11 ENCOUNTER — Emergency Department (HOSPITAL_COMMUNITY): Payer: Self-pay

## 2022-04-11 ENCOUNTER — Inpatient Hospital Stay (HOSPITAL_COMMUNITY): Payer: Self-pay

## 2022-04-11 DIAGNOSIS — E8721 Acute metabolic acidosis: Secondary | ICD-10-CM

## 2022-04-11 DIAGNOSIS — F17210 Nicotine dependence, cigarettes, uncomplicated: Secondary | ICD-10-CM | POA: Diagnosis present

## 2022-04-11 DIAGNOSIS — E860 Dehydration: Secondary | ICD-10-CM | POA: Diagnosis present

## 2022-04-11 DIAGNOSIS — R569 Unspecified convulsions: Principal | ICD-10-CM

## 2022-04-11 DIAGNOSIS — N179 Acute kidney failure, unspecified: Secondary | ICD-10-CM | POA: Diagnosis present

## 2022-04-11 DIAGNOSIS — N760 Acute vaginitis: Secondary | ICD-10-CM | POA: Diagnosis present

## 2022-04-11 DIAGNOSIS — N390 Urinary tract infection, site not specified: Secondary | ICD-10-CM | POA: Diagnosis present

## 2022-04-11 DIAGNOSIS — E872 Acidosis, unspecified: Secondary | ICD-10-CM | POA: Diagnosis present

## 2022-04-11 DIAGNOSIS — F329 Major depressive disorder, single episode, unspecified: Secondary | ICD-10-CM

## 2022-04-11 DIAGNOSIS — R32 Unspecified urinary incontinence: Secondary | ICD-10-CM | POA: Diagnosis present

## 2022-04-11 DIAGNOSIS — E861 Hypovolemia: Secondary | ICD-10-CM | POA: Diagnosis present

## 2022-04-11 DIAGNOSIS — Z79899 Other long term (current) drug therapy: Secondary | ICD-10-CM

## 2022-04-11 DIAGNOSIS — E8889 Other specified metabolic disorders: Secondary | ICD-10-CM | POA: Diagnosis present

## 2022-04-11 DIAGNOSIS — F1290 Cannabis use, unspecified, uncomplicated: Secondary | ICD-10-CM

## 2022-04-11 DIAGNOSIS — F1490 Cocaine use, unspecified, uncomplicated: Secondary | ICD-10-CM | POA: Diagnosis present

## 2022-04-11 DIAGNOSIS — G4089 Other seizures: Secondary | ICD-10-CM

## 2022-04-11 DIAGNOSIS — A599 Trichomoniasis, unspecified: Secondary | ICD-10-CM | POA: Diagnosis present

## 2022-04-11 DIAGNOSIS — Z9151 Personal history of suicidal behavior: Secondary | ICD-10-CM

## 2022-04-11 DIAGNOSIS — F1090 Alcohol use, unspecified, uncomplicated: Secondary | ICD-10-CM

## 2022-04-11 DIAGNOSIS — F322 Major depressive disorder, single episode, severe without psychotic features: Secondary | ICD-10-CM | POA: Diagnosis present

## 2022-04-11 DIAGNOSIS — F10239 Alcohol dependence with withdrawal, unspecified: Secondary | ICD-10-CM | POA: Diagnosis present

## 2022-04-11 DIAGNOSIS — F10139 Alcohol abuse with withdrawal, unspecified: Secondary | ICD-10-CM | POA: Diagnosis present

## 2022-04-11 LAB — DIFFERENTIAL
Abs Immature Granulocytes: 0.07 10*3/uL (ref 0.00–0.07)
Basophils Absolute: 0 10*3/uL (ref 0.0–0.1)
Basophils Relative: 0 %
Eosinophils Absolute: 0.1 10*3/uL (ref 0.0–0.5)
Eosinophils Relative: 1 %
Immature Granulocytes: 1 %
Lymphocytes Relative: 19 %
Lymphs Abs: 2.7 10*3/uL (ref 0.7–4.0)
Monocytes Absolute: 1 10*3/uL (ref 0.1–1.0)
Monocytes Relative: 7 %
Neutro Abs: 10.2 10*3/uL — ABNORMAL HIGH (ref 1.7–7.7)
Neutrophils Relative %: 72 %

## 2022-04-11 LAB — URINALYSIS, ROUTINE W REFLEX MICROSCOPIC
Bilirubin Urine: NEGATIVE
Glucose, UA: NEGATIVE mg/dL
Hgb urine dipstick: NEGATIVE
Ketones, ur: 20 mg/dL — AB
Nitrite: NEGATIVE
Protein, ur: NEGATIVE mg/dL
Specific Gravity, Urine: 1.014 (ref 1.005–1.030)
Trans Epithel, UA: NONE SEEN
pH: 6 (ref 5.0–8.0)

## 2022-04-11 LAB — COMPREHENSIVE METABOLIC PANEL
ALT: 24 U/L (ref 0–44)
AST: 38 U/L (ref 15–41)
Albumin: 3.8 g/dL (ref 3.5–5.0)
Alkaline Phosphatase: 68 U/L (ref 38–126)
Anion gap: 19 — ABNORMAL HIGH (ref 5–15)
BUN: 11 mg/dL (ref 6–20)
CO2: 15 mmol/L — ABNORMAL LOW (ref 22–32)
Calcium: 9.2 mg/dL (ref 8.9–10.3)
Chloride: 105 mmol/L (ref 98–111)
Creatinine, Ser: 1.01 mg/dL — ABNORMAL HIGH (ref 0.44–1.00)
GFR, Estimated: >60 mL/min (ref >60)
Glucose, Bld: 152 mg/dL — ABNORMAL HIGH (ref 70–99)
Potassium: 3.9 mmol/L (ref 3.5–5.1)
Sodium: 139 mmol/L (ref 135–145)
Total Bilirubin: 0.7 mg/dL (ref 0.3–1.2)
Total Protein: 7.4 g/dL (ref 6.5–8.1)

## 2022-04-11 LAB — CBC
HCT: 43.6 % (ref 36.0–46.0)
Hemoglobin: 13.9 g/dL (ref 12.0–15.0)
MCH: 28.4 pg (ref 26.0–34.0)
MCHC: 31.9 g/dL (ref 30.0–36.0)
MCV: 89 fL (ref 80.0–100.0)
Platelets: 253 10*3/uL (ref 150–400)
RBC: 4.9 MIL/uL (ref 3.87–5.11)
RDW: 14.2 % (ref 11.5–15.5)
WBC: 14.2 10*3/uL — ABNORMAL HIGH (ref 4.0–10.5)
nRBC: 0 % (ref 0.0–0.2)

## 2022-04-11 LAB — RAPID URINE DRUG SCREEN, HOSP PERFORMED
Amphetamines: NOT DETECTED
Barbiturates: NOT DETECTED
Benzodiazepines: POSITIVE — AB
Cocaine: POSITIVE — AB
Opiates: NOT DETECTED
Tetrahydrocannabinol: NOT DETECTED

## 2022-04-11 LAB — LACTIC ACID, PLASMA: Lactic Acid, Venous: 1.1 mmol/L (ref 0.5–1.9)

## 2022-04-11 LAB — ETHANOL: Alcohol, Ethyl (B): <10 mg/dL (ref <10)

## 2022-04-11 MED ORDER — THIAMINE HCL 100 MG/ML IJ SOLN: 100.0000 mg | Freq: Every day | INTRAMUSCULAR | Status: DC

## 2022-04-11 MED ORDER — LORAZEPAM 2 MG/ML IJ SOLN
1.0000 mg | INTRAMUSCULAR | Status: DC | PRN
  Filled 2022-04-11: qty 1

## 2022-04-11 MED ORDER — LORAZEPAM 2 MG/ML IJ SOLN
1.0000 mg | Freq: Once | INTRAMUSCULAR | Status: AC
  Administered 2022-04-11: 1 mg via INTRAVENOUS
  Filled 2022-04-11: qty 1

## 2022-04-11 MED ORDER — IBUPROFEN 400 MG PO TABS
400.0000 mg | ORAL_TABLET | Freq: Once | ORAL | Status: AC
  Administered 2022-04-11: 400 mg via ORAL
  Filled 2022-04-11: qty 1

## 2022-04-11 MED ORDER — FOLIC ACID 1 MG PO TABS
1.0000 mg | ORAL_TABLET | Freq: Every day | ORAL | Status: DC
  Administered 2022-04-12 – 2022-04-13 (×2): 1 mg via ORAL
  Filled 2022-04-11 (×2): qty 1

## 2022-04-11 MED ORDER — ENOXAPARIN SODIUM 40 MG/0.4ML IJ SOSY
40.0000 mg | PREFILLED_SYRINGE | INTRAMUSCULAR | Status: DC
  Administered 2022-04-11 – 2022-04-13 (×3): 40 mg via SUBCUTANEOUS
  Filled 2022-04-11 (×3): qty 0.4

## 2022-04-11 MED ORDER — LORAZEPAM 1 MG PO TABS: 1.0000 mg | ORAL_TABLET | ORAL | Status: DC | PRN

## 2022-04-11 MED ORDER — ACETAMINOPHEN 325 MG PO TABS
650.0000 mg | ORAL_TABLET | Freq: Four times a day (QID) | ORAL | Status: DC | PRN
  Administered 2022-04-11: 650 mg via ORAL
  Filled 2022-04-11: qty 2

## 2022-04-11 MED ORDER — ADULT MULTIVITAMIN W/MINERALS CH
1.0000 | ORAL_TABLET | Freq: Every day | ORAL | Status: DC
  Administered 2022-04-12 – 2022-04-13 (×2): 1 via ORAL
  Filled 2022-04-11 (×2): qty 1

## 2022-04-11 MED ORDER — THIAMINE HCL 100 MG PO TABS: 100.0000 mg | ORAL_TABLET | Freq: Every day | ORAL | Status: DC

## 2022-04-11 MED ORDER — THIAMINE HCL 100 MG/ML IJ SOLN
250.0000 mg | Freq: Every day | INTRAVENOUS | Status: DC
  Administered 2022-04-13: 250 mg via INTRAVENOUS
  Filled 2022-04-11: qty 2.5

## 2022-04-11 MED ORDER — THIAMINE HCL 100 MG/ML IJ SOLN
500.0000 mg | Freq: Three times a day (TID) | INTRAVENOUS | Status: AC
  Administered 2022-04-11 – 2022-04-12 (×6): 500 mg via INTRAVENOUS
  Filled 2022-04-11 (×9): qty 5

## 2022-04-11 MED ORDER — LEVETIRACETAM IN NACL 1500 MG/100ML IV SOLN
1500.0000 mg | Freq: Once | INTRAVENOUS | Status: AC
  Administered 2022-04-11: 1500 mg via INTRAVENOUS
  Filled 2022-04-11: qty 100

## 2022-04-11 MED ORDER — LIDOCAINE 5 % EX PTCH
1.0000 | MEDICATED_PATCH | CUTANEOUS | Status: DC
  Administered 2022-04-11 – 2022-04-12 (×2): 1 via TRANSDERMAL
  Filled 2022-04-11 (×2): qty 1

## 2022-04-11 MED ORDER — LEVETIRACETAM IN NACL 1000 MG/100ML IV SOLN
1000.0000 mg | Freq: Two times a day (BID) | INTRAVENOUS | Status: DC
  Administered 2022-04-11 – 2022-04-13 (×4): 1000 mg via INTRAVENOUS
  Filled 2022-04-11 (×4): qty 100

## 2022-04-11 MED ORDER — SODIUM CHLORIDE 0.9 % IV BOLUS (SEPSIS)
2000.0000 mL | Freq: Once | INTRAVENOUS | Status: AC
  Administered 2022-04-11: 2000 mL via INTRAVENOUS

## 2022-04-11 NOTE — Procedures (Signed)
Patient Name: Theresa Rios  MRN: [id number]  Epilepsy Attending: Charlsie Quest  Referring Physician/Provider: Jefferson Fuel, MD  Date: 04/11/2022 Duration: 23.20 mins  Patient history: 56 y.o. female with past medical history of seizures on no medications, MDD, ETOH use, cocaine use and smoker, recent hospitalization @ [facility] suspected overdose of melatoninfor who presented to the ED for 3 witnessed seizures at home. EEG to evaluate for seizure  Level of alertness: Awake, asleep  AEDs during EEG study: LEV, Ativan  Technical aspects: This EEG study was done with scalp electrodes positioned according to the 10-20 International system of electrode placement. Electrical activity was reviewed with band pass filter of 1-70Hz , sensitivity of 7 uV/mm, display speed of 53mm/sec with a 60Hz  notched filter applied as appropriate. EEG data were recorded continuously and digitally stored.  Video monitoring was available and reviewed as appropriate.  Description: No clear posterior dominant rhythm was seen. Sleep was characterized by sleep spindles (12 to 14 Hz), maximal frontocentral region.  There is an excessive amount of 15 to 18 Hz beta activity distributed symmetrically and diffusely. Hyperventilation and photic stimulation were not performed.     ABNORMALITY - Excessive beta, generalized  IMPRESSION: This study is within normal limits. The excessive beta activity seen in the background is most likely due to the effect of benzodiazepine and is a benign EEG pattern. No seizures or epileptiform discharges were seen throughout the recording.  [name] 

## 2022-04-11 NOTE — ED Notes (Signed)
Entered room found a basin on the floor with urine it, urine on the floor, room smelled like pt had a BM, found chucks in the trash can with stool on it. Pt has been educated multiple times to not do this to use the call bell and we would help her to the bathroom.

## 2022-04-11 NOTE — Progress Notes (Signed)
Pt was Transferred from ED to 3W23. MB went from Room 3W23 back down to ED to retrieve equipment to re-hook Patient. After arriving back to 3W Pt was admitted to a different room other than the most recent..room [phone]

## 2022-04-11 NOTE — ED Notes (Signed)
Pt re-attached to EEG machine

## 2022-04-11 NOTE — ED Notes (Signed)
Neurology at bedside.

## 2022-04-11 NOTE — ED Provider Notes (Signed)
While patient was awaiting discharge, she began to have a seizure.  Seizure terminated spontaneously, she is now on the monitor pulse ox over 95%.  She is now having postictal confusion.  She will need to be admitted   Zadie Rhine, MD 04/11/22 [phone]

## 2022-04-11 NOTE — Consult Note (Signed)
Neurology Consultation  Reason for Consult: Seizures Referring Physician: Dr. Bebe Shaggy  CC: Seizures  History is obtained from:medical record   HPI: Theresa Rios is a 56 y.o. female with past medical history of seizures on no medications, MDD, ETOH use, cocaine use and smoker, recent hospitalization @ [facility] suspected overdose of melatoninfor who presented to the ED for 3 witnessed at home. Spoke with husband at the bedside, he states she started having seizures about 2 years ago, was never put on any medications. He reports she drinks alcohol daily, unable to quantify how much at least 7 bottles but it depends on how much money she has available. She also uses cocaine with last use on Tuesday. He states yesterday she only had 1 beer prior to having these seizures. She smokes 1/2-1 pack per day of cigarettes. He endorses that she is unresponsive and incontinent during the seizures and then she is confused afterwards and these episodes last about 30 seconds or so.  He states she will have a seizure at least once a week. Asked husband if these seizures occur in the presence of using cocaine or in the context of less alcohol intake and he states it does not correlate. He is not aware of an alcohol withdrawal, and states she was detoxed in [facility] but she has started drinking again.   Today while waiting to be discharged she was witnessed to have a seizure with her arm in the air shaking and locked up which lasted about 1 minute and stopped spontaneously. She was given 1 mg ativan and loaded with Keppra 40mg /kg IV. CT head has been ordered. Neurology called for assistance    ROS:  Unable to obtain due to altered mental status.   Past Medical History:  Diagnosis Date   Seizures (HCC)      History reviewed. No pertinent family history.   Social History:   reports that she has been smoking cigarettes. She has been smoking an average of .5 packs per day. She has never used smokeless tobacco. She reports  current alcohol use. She reports current drug use. Drugs: Cocaine and Marijuana.  Medications  Current Facility-Administered Medications:    levETIRAcetam (KEPPRA) IVPB 1500 mg/ 100 mL premix, 1,500 mg, Intravenous, Once, , MD   levETIRAcetam (KEPPRA) IVPB 1500 mg/ 100 mL premix, 1,500 mg, Intravenous, Once, Zadie Rhine, MD, Last Rate: 400 mL/hr at 04/11/22 0735, 1,500 mg at 04/11/22 0735  Current Outpatient Medications:    gabapentin (NEURONTIN) 300 MG capsule, Take 2 capsules (600 mg total) by mouth at bedtime., Disp: 60 capsule, Rfl: 0   gabapentin (NEURONTIN) 300 MG capsule, Take 1 capsule (300 mg total) by mouth in the morning., Disp: 30 capsule, Rfl: 0   hydrOXYzine (ATARAX) 25 MG tablet, Take 1 tablet (25 mg total) by mouth 3 (three) times daily as needed for anxiety., Disp: 90 tablet, Rfl: 1   nicotine (NICODERM CQ - DOSED IN MG/24 HOURS) 14 mg/24hr patch, Place 1 patch (14 mg total) onto the skin daily., Disp: 28 patch, Rfl: 1   sertraline (ZOLOFT) 100 MG tablet, Take 1 tablet (100 mg total) by mouth daily., Disp: 30 tablet, Rfl: 1   traZODone (DESYREL) 100 MG tablet, Take 1 tablet (100 mg total) by mouth at bedtime as needed for sleep., Disp: 30 tablet, Rfl: 0   Exam: Current vital signs: BP (!) 143/68   Pulse 88   Temp 98.4 F (36.9 C) (Oral)   Resp 16   Ht 5\' 5"  (  1.651 m)   Wt 70 kg   LMP 05/17/2014   SpO2 100%   BMI 25.68 kg/m  Vital signs in last 24 hours: Temp:  [98.3 F (36.8 C)-98.8 F (37.1 C)] 98.4 F (36.9 C) (08/03 0607) Pulse Rate:  [78-110] 88 (08/03 0718) Resp:  [16-22] 16 (08/03 0718) BP: (120-169)/(68-112) 143/68 (08/03 0718) SpO2:  [93 %-100 %] 100 % (08/03 0718) Weight:  [70 kg] 70 kg (08/03 0343)  GENERAL: lethargic In NAD HEENT: - Normocephalic and atraumatic, dry mm LUNGS - Clear to auscultation bilaterally with no wheezes CV - S1S2 RRR, no m/r/g, equal pulses bilaterally. ABDOMEN - Soft, nontender, nondistended with  normoactive BS Ext: warm, well perfused, intact peripheral pulses, no edema  NEURO:  Mental Status: lethargic, recently received ativan. She opens her eyes to voice briefly, does not follow commands, able to mumble her name, moving all extremities Language: speech is unable to assess.  Cranial Nerves: PERRL, EOMI, visual fields full to threat bilaterally, no facial asymmetry, facial sensation intact, hearing intact, tongue/uvula/soft palate midline, normal sternocleidomastoid and trapezius muscle strength. No evidence of tongue atrophy or fibrillations Motor: moving all extremities spontaneously Tone: is normal and bulk is normal Sensation- Intact to noxious stimuli Coordination: Unable to assess Gait- deferred   Labs I have reviewed labs in epic and the results pertinent to this consultation are:  CBC    Component Value Date/Time   WBC 14.2 (H) 04/11/2022 0030   RBC 4.90 04/11/2022 0030   HGB 13.9 04/11/2022 0030   HCT 43.6 04/11/2022 0030   PLT 253 04/11/2022 0030   MCV 89.0 04/11/2022 0030   MCH 28.4 04/11/2022 0030   MCHC 31.9 04/11/2022 0030   RDW 14.2 04/11/2022 0030   LYMPHSABS 2.7 04/11/2022 0030   MONOABS 1.0 04/11/2022 0030   EOSABS 0.1 04/11/2022 0030   BASOSABS 0.0 04/11/2022 0030    CMP     Component Value Date/Time   NA 139 04/11/2022 0030   K 3.9 04/11/2022 0030   CL 105 04/11/2022 0030   CO2 15 (L) 04/11/2022 0030   GLUCOSE 152 (H) 04/11/2022 0030   BUN 11 04/11/2022 0030   CREATININE 1.01 (H) 04/11/2022 0030   CALCIUM 9.2 04/11/2022 0030   PROT 7.4 04/11/2022 0030   ALBUMIN 3.8 04/11/2022 0030   AST 38 04/11/2022 0030   ALT 24 04/11/2022 0030   ALKPHOS 68 04/11/2022 0030   BILITOT 0.7 04/11/2022 0030   GFRNONAA >60 04/11/2022 0030   GFRAA >60 10/11/2019 2000    Lipid Panel     Component Value Date/Time   CHOL 223 (H) 03/22/2022 0648   TRIG 102 03/22/2022 0648   HDL 91 03/22/2022 0648   CHOLHDL 2.5 03/22/2022 0648   VLDL 20 03/22/2022  0648   LDLCALC 112 (H) 03/22/2022 0648    LABS:  Ethanol negative WBC 14.2 Glucose 152 Cr 1.01  Head CT NAICP (personally reviewed by Dr. Selina Cooley)  Assessment:  Theresa Rios is a 56 y.o. female with past medical history of seizures on no medications, MDD, ETOH use, cocaine use and smoker, recent hospitalization @ [facility] suspected overdose of melatoninfor who presented to the ED for 3 witnessed at home. Spoke with husband at the bedside, he states she started having seizures about 2 years ago, was never put on any medications. He reports she drinks alcohol daily, unable to quantify how much at least 7 bottles but it depends on how much money she has available. She also uses cocaine  with last use on Tuesday. He states yesterday she only had 1 beer prior to having these seizures. She smokes 1/2-1 pack per day of cigarettes. He endorses that she is unresponsive and incontinent during the seizures and then she is confused afterwards and these episodes last about 30 seconds or so.  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   Recommendations: - admit to medicine  - Seizure precautions - Check routine EEG and then LTM with MRI compatible leads - MRI brain wo contrast this AM was nondiagnostic 2/2 movement - please order MRI brain with and without contrast when patient is able to cooperate - Keppra 1000MG  IV BID - given recurrent episodes of withdrawal-related seizures patient should remain on AED until she is sober, and at that time may be titrated off slowly at the discretion of her outpatient neurologist - Check UA and UDS, fentanyl blood and urine screens sent to [facility] - CIWA protocol - High dose thiamine 500mg  IV Q8h x 2 days, 250mg  IV  Daily x 2 days then 100mg  daily (wernicke dosing) - May need nicotine patch  - EtOH/substance abuse counseling when able  to participate - Driving restrictions x6 mos after last seizure (pt should be counseled when no longer sedated) - Seizure precautions - Amb referral to neurology at time of discharge  DNP, ACNPC-AG  Neurology Attending Attestation   I examined the patient and discussed plan with Ms. NP. Above note has been edited by me to reflect my findings and recommendations. I personally reviewed all CNS imaging.   , MD [facility] [phone]   If 7pm- 7am, please page neurology on call as listed in AMION.

## 2022-04-11 NOTE — ED Notes (Signed)
Pt transported to CT ?

## 2022-04-11 NOTE — ED Notes (Addendum)
Pt is now c/o a headache at this time. Per pt she isn't sure why she is here and per pt she is compliant with her medication for seizures. Provider sent a message reference same

## 2022-04-11 NOTE — Progress Notes (Signed)
LTM EEG hooked up and running - no initial skin breakdown - push button tested - neuro notified.  

## 2022-04-11 NOTE — ED Notes (Signed)
This NT went in the patients room twice to adjust the cuff to get vitals, and both times the patient has gotten a bedpan out of the cabinet, urinated in it, and laid it on the floor. Both rails are up due to seizure activity. Large amounts of urine were on the floor. This NT educated the patient both times to press the call button for assistance. Patient agreed to both times. This NT cleaned up the urine. Call light is within reach. Nurse notified.

## 2022-04-11 NOTE — ED Provider Notes (Signed)
Patient now more alert but not back to baseline.  She will need to be admitted.  Discussed with neurology who recommends Keppra 40 mg/kg IV.  Recommend CT head and admission to hospitalist.  They will evaluate the patient  Signout to Dr. Manus Gunning with report to be called   Zadie Rhine, MD 04/11/22 [phone]

## 2022-04-11 NOTE — ED Notes (Signed)
Pt transported to MRI 

## 2022-04-11 NOTE — ED Triage Notes (Signed)
Patient arrives via EMS from home due to having 3 wtinessed seizures. Patient does have history of seizures but non compliant with medications. Patient was incontinent of urine. EMS gave 2.5 versed prior to arrival. BP 196/68 CBG 136, patient arrives on 2 liters of oxygen via nasal cannula.

## 2022-04-11 NOTE — ED Notes (Signed)
Walking by pt in the hall way and noted to her arm was up in the air shaking and locked up. Witnessed seizure like activity that lasted approximately 1 min. Dr. Bebe Shaggy made aware and at bedside. Pt was moved to room 28 and placed on O2 via NRB, noted bloody secretions out of her mouth. Difficult time getting pt to lay still to place on monitor and start a PIV.

## 2022-04-11 NOTE — ED Provider Notes (Signed)
Admission d/w Dr. Cyndie Chime. Vitals stable. Protecting airway.   Glynn Octave, MD 04/11/22 [phone]

## 2022-04-11 NOTE — ED Notes (Signed)
Pt moved to hallway to wait for transportation home

## 2022-04-11 NOTE — ED Notes (Signed)
Pt returned from MRI; per MRI tech, unable to complete MRI due to pt moving, pulling at EEG leads; Dr. Cyndie Chime notified

## 2022-04-11 NOTE — ED Notes (Signed)
MRI called this RN stating pt c/o 8/10 back pain, asking for pain meds; MD notified

## 2022-04-11 NOTE — Discharge Instructions (Addendum)
Please be aware you may have another seizure  Do not drive until seen by your physician for your condition  Do not climb ladders/roofs/trees as a seizure can occur at that height and cause serious harm  Do not bathe/swim alone as a seizure can occur and cause serious harm  Please followup with your physician or neurologist for further testing and possible treatment  You were hospitalized for seizures which we think are likely due to alcohol withdrawal.  We discussed that we were treating you for alcohol withdrawal here and that if you went home and did not drink you could go back into withdrawals.  Going back into alcohol withdrawals could cause you to have another seizure and possibly die. Thank you for allowing [place] to be part of your care.   We arranged for you to follow up at:   Please call your your primary care physician office and make an appointment for this week with Theresa Rios.  Please note these changes made to your medications:   Please START taking:  Keppra 1 g twice a day Please continue taking metronidazole 500 mg twice a day for the next 5 days and your husband should take 100 mg twice a day for the next 7 days.  Please make sure to call your doctor or come to the hospital if you plan to stop drinking or cut back significantly.  We recommend that you talk to your primary care physician about alcohol cessation and the programs and medications that can be used to help if you are interested.  Please call our clinic if you have any questions or concerns, we may be able to help and keep you from a long and expensive emergency room wait. Our clinic and after hours phone number is [phone], the best time to call is Monday through Friday 9 am to 4 pm but there is always someone available 24/7 if you have an emergency. If you need medication refills please notify your pharmacy one week in advance and they will send [place] a request.

## 2022-04-11 NOTE — ED Notes (Signed)
This NT left a bedside commode in the patients room. This NT educated the patient how important it was to use the call bell due to both side rails being up, and recent seizure activity, and explained we would help her to the bedside commode. Patient responded "Okay, great great" Nurse notified.

## 2022-04-11 NOTE — ED Provider Notes (Signed)
[facility] EMERGENCY DEPARTMENT Provider Note   CSN: [id number] Arrival date & time: 04/11/22  0020     History  Chief Complaint  Patient presents with   Seizures  Level 5 caveat due to altered mental status  Theresa Rios is a 56 y.o. female.  The history is provided by the EMS personnel.  Seizures  This reported patient presents from home after having 3 seizures.  It is reported patient has a history of seizures but is not on any medications.  It is reported patient had urinary incontinence.  EMS did witness 1 seizure and patient was given 2.5 mg of Versed. No other details known on arrival    Home Medications Prior to Admission medications   Medication Sig Start Date End Date Taking? Authorizing Provider  gabapentin (NEURONTIN) 300 MG capsule Take 2 capsules (600 mg total) by mouth at bedtime. 03/27/22 04/26/22  Massengill, Ovid Curd, MD  gabapentin (NEURONTIN) 300 MG capsule Take 1 capsule (300 mg total) by mouth in the morning. 03/27/22 04/26/22  Massengill, Ovid Curd, MD  hydrOXYzine (ATARAX) 25 MG tablet Take 1 tablet (25 mg total) by mouth 3 (three) times daily as needed for anxiety. 04/03/22   Armando Reichert, MD  nicotine (NICODERM CQ - DOSED IN MG/24 HOURS) 14 mg/24hr patch Place 1 patch (14 mg total) onto the skin daily. 04/03/22   Armando Reichert, MD  sertraline (ZOLOFT) 100 MG tablet Take 1 tablet (100 mg total) by mouth daily. 04/03/22   Armando Reichert, MD  traZODone (DESYREL) 100 MG tablet Take 1 tablet (100 mg total) by mouth at bedtime as needed for sleep. 03/27/22 04/26/22  Janine Limbo, MD      Allergies    Patient has no known allergies.    Review of Systems   Review of Systems  Unable to perform ROS: Mental status change  Neurological:  Positive for seizures.    Physical Exam Updated Vital Signs BP (!) 146/86   Pulse 78   Temp 98.8 F (37.1 C) (Oral)   Resp 17   Ht 1.651 m (5\' 5" )   Wt 70 kg   LMP 05/17/2014   SpO2 95%   BMI 25.68  kg/m  Physical Exam CONSTITUTIONAL: Disheveled and somnolent HEAD: Normocephalic/atraumatic EYES: EOMI/PERRL, no nystagmus ENMT: Mucous membranes moist NECK: supple no meningeal signs SPINE/BACK:entire spine nontender CV: S1/S2 noted, no murmurs/rubs/gallops noted LUNGS: Lungs are clear to auscultation bilaterally, no apparent distress ABDOMEN: soft, nontender NEURO: Pt is somnolent but arousable to voice and pain.  She moves all extremities x4 EXTREMITIES: pulses normal/equal, full ROM SKIN: warm, color normal PSYCH: Unable to assess  ED Results / Procedures / Treatments   Labs (all labs ordered are listed, but only abnormal results are displayed) Labs Reviewed  CBC - Abnormal; Notable for the following components:      Result Value   WBC 14.2 (*)    All other components within normal limits  DIFFERENTIAL - Abnormal; Notable for the following components:   Neutro Abs 10.2 (*)    All other components within normal limits  COMPREHENSIVE METABOLIC PANEL - Abnormal; Notable for the following components:   CO2 15 (*)    Glucose, Bld 152 (*)    Creatinine, Ser 1.01 (*)    Anion gap 19 (*)    All other components within normal limits  ETHANOL    EKG EKG Interpretation  Date/Time:  Thursday April 11 2022 00:32:24 EDT Ventricular Rate:  93 PR Interval:  156 QRS  Duration: 97 QT Interval:  381 QTC Calculation: 474 R Axis:   -17 Text Interpretation: Sinus rhythm Probable left atrial enlargement Abnormal R-wave progression, early transition Left ventricular hypertrophy Confirmed by Zadie Rhine ([id number]) on 04/11/2022 1:21:56 AM  Radiology DG Chest Port 1 View  Result Date: 04/11/2022 CLINICAL DATA:  Recent seizure activity EXAM: PORTABLE CHEST 1 VIEW COMPARISON:  03/14/2022 FINDINGS: Cardiac shadow is enlarged but stable. The lungs are well aerated bilaterally. Mild right basilar atelectasis is seen. No acute bony abnormality is noted. IMPRESSION: Mild right basilar  atelectasis. Electronically Signed   By: Alcide Clever M.D.   On: 04/11/2022 01:32    Procedures Procedures    Medications Ordered in ED Medications  sodium chloride 0.9 % bolus 2,000 mL (0 mLs Intravenous Stopped 04/11/22 0509)  ibuprofen (ADVIL) tablet 400 mg (400 mg Oral Given 04/11/22 0348)    ED Course/ Medical Decision Making/ A&P Clinical Course as of 04/11/22 0549  Thu Apr 11, 2022  0123 CO2(!): 15 Dehydration/anion gap likely due to seizure [DW]  0127 Patient presents after reportedly having 3 seizures prior to arrival.  It is reported she has a history of seizures but not on medications.  Patient appears to be in a postictal state but also received Versed.  She has had neuroimaging recently, will defer CT head for now [DW]  0127 Patient did have coughing and is on oxygen, will obtain chest x-ray. [DW]  0127 Labs reveal dehydration, as well as leukocytosis [DW]  0218 Patient resting comfortably, no acute distress. [DW]  0224 Discussed with husband via phone.  He reports that patient had been doing well after recent discharge from [facility].  She had cut back on her alcohol use.  However she had 3 seizures tonight.  He does not think she is ever actually seen a neurologist. [DW]  0530 Patient improved.  She is awake and alert.  She denies any complaints.  She is answer questions appropriate.  She has been ambulatory.  She reports she has never seen neurology.  She will need close outpatient follow-up [DW]  [phone] Patient given IV fluids for the dehydration after the seizure. [DW]  [phone] Patient awake and alert, no acute distress.  She has been ambulatory.  No signs of any neurologic deficits.  Discussed multiple times the need for close neurology follow-up as she will likely need medications.  Discussed that she cannot drive or be in water alone due to risk of seizure.  Patient understands this [DW]    Clinical Course User Index [DW] Zadie Rhine, MD                           Medical  Decision Making Amount and/or Complexity of Data Reviewed Labs: ordered. Decision-making details documented in ED Course. Radiology: ordered.  Risk Prescription drug management.   This patient presents to the ED for concern of altered mental status and seizure, this involves an extensive number of treatment options, and is a complaint that carries with it a high risk of complications and morbidity.  The differential diagnosis includes but is not limited to status epilepticus, postictal syndrome, drug intoxication, alcohol intoxication, head injury  Comorbidities that complicate the patient evaluation: Patient's presentation is complicated by their history of seizures  Social Determinants of Health: Patient's  substance use   increases the complexity of managing their presentation, lack of primary care  Additional history obtained: Additional history obtained from spouse   Lab  Tests: I Ordered, and personally interpreted labs.  The pertinent results include: deHydration    Cardiac Monitoring: The patient was maintained on a cardiac monitor.  I personally viewed and interpreted the cardiac monitor which showed an underlying rhythm of:  sinus rhythm  Medicines ordered and prescription drug management: I ordered medication including IV fluids for dehydration Reevaluation of the patient after these medicines showed that the patient    improved  Test Considered: Insert neuroimaging the patient has had multiple evaluations previously No signs of head trauma And back to baseline no indication for neuroimaging  Reevaluation: After the interventions noted above, I reevaluated the patient and found that they have :improved  Complexity of problems addressed: Patient's presentation is most consistent with  acute presentation with potential threat to life or bodily function  Disposition: After consideration of the diagnostic results and the patient's response to treatment,  I feel  that the patent would benefit from discharge   .           Final Clinical Impression(s) / ED Diagnoses Final diagnoses:  Seizure (HCC)    Rx / DC Orders ED Discharge Orders          Ordered    Ambulatory referral to Neurology       Comments: An appointment is requested in approximately: 1 week   04/11/22 0531              Zadie Rhine, MD 04/11/22 [phone]

## 2022-04-11 NOTE — Progress Notes (Signed)
EEG completed, results pending. 

## 2022-04-11 NOTE — ED Notes (Signed)
Lunch tray at bedside. ?

## 2022-04-11 NOTE — ED Notes (Signed)
ED TO INPATIENT HANDOFF REPORT  ED Nurse Name and Phone #: [phone]  S Name/Age/Gender Theresa Rios 56 y.o. female Room/Bed: 028C/028C  Code Status   Code Status: Full Code  Home/SNF/Other Home Patient oriented to: self, place, time, and situation Is this baseline? Yes   Triage Complete: Triage complete  Chief Complaint Seizure [facility]) [R56.9]  Triage Note Patient arrives via EMS from home due to having 3 wtinessed seizures. Patient does have history of seizures but non compliant with medications. Patient was incontinent of urine. EMS gave 2.5 versed prior to arrival. BP 196/68 CBG 136, patient arrives on 2 liters of oxygen via nasal cannula.    Allergies No Known Allergies  Level of Care/Admitting Diagnosis ED Disposition     ED Disposition  Admit   Condition  --   Comment  Hospital Area: [facility] [[id number]]  Level of Care: Progressive [102]  Admit to Progressive based on following criteria: NEUROLOGICAL AND NEUROSURGICAL complex patients with significant risk of instability, who do not meet ICU criteria, yet require close observation or frequent assessment (< / = every 2 - 4 hours) with medical / nursing intervention.  May admit patient to [facility] or [facility] if equivalent level of care is available:: No  Covid Evaluation: Asymptomatic - no recent exposure (last 10 days) testing not required  Diagnosis: Seizure [facility]) [[id number]]  Admitting Physician: Inez Catalina [phone]  Attending Physician: Inez Catalina [phone]  Certification:: I certify this patient will need inpatient services for at least 2 midnights  Estimated Length of Stay: 2          B Medical/Surgery History Past Medical History:  Diagnosis Date   Seizures (HCC)    History reviewed. No pertinent surgical history.   A IV Location/Drains/Wounds Patient Lines/Drains/Airways Status     Active Line/Drains/Airways     Name Placement date Placement time Site Days    Peripheral IV 04/11/22 18 G Left Antecubital 04/11/22  0705  Antecubital  less than 1   Peripheral IV 04/11/22 20 G Anterior;Right Forearm 04/11/22  0807  Forearm  less than 1            Intake/Output Last 24 hours  Intake/Output Summary (Last 24 hours) at 04/11/2022 1451 Last data filed at 04/11/2022 0509 Gross per 24 hour  Intake 2000 ml  Output --  Net 2000 ml    Labs/Imaging Results for orders placed or performed during the hospital encounter of 04/11/22 (from the past 48 hour(s))  Ethanol     Status: None   Collection Time: 04/11/22 12:30 AM  Result Value Ref Range   Alcohol, Ethyl (B) <10 <10 mg/dL    Comment: (NOTE) Lowest detectable limit for serum alcohol is 10 mg/dL.  For medical purposes only. Performed at [facility], [address], [place], [place] [address]   CBC     Status: Abnormal   Collection Time: 04/11/22 12:30 AM  Result Value Ref Range   WBC 14.2 (H) 4.0 - 10.5 K/uL   RBC 4.90 3.87 - 5.11 MIL/uL   Hemoglobin 13.9 12.0 - 15.0 g/dL   HCT 36.1 44.3 - 15.4 %   MCV 89.0 80.0 - 100.0 fL   MCH 28.4 26.0 - 34.0 pg   MCHC 31.9 30.0 - 36.0 g/dL   RDW 00.8 67.6 - 19.5 %   Platelets 253 150 - 400 K/uL   nRBC 0.0 0.0 - 0.2 %    Comment: Performed at [facility]  [facility], [address] [place], [place] [address]  Differential     Status: Abnormal   Collection Time: 04/11/22 12:30 AM  Result Value Ref Range   Neutrophils Relative % 72 %   Neutro Abs 10.2 (H) 1.7 - 7.7 K/uL   Lymphocytes Relative 19 %   Lymphs Abs 2.7 0.7 - 4.0 K/uL   Monocytes Relative 7 %   Monocytes Absolute 1.0 0.1 - 1.0 K/uL   Eosinophils Relative 1 %   Eosinophils Absolute 0.1 0.0 - 0.5 K/uL   Basophils Relative 0 %   Basophils Absolute 0.0 0.0 - 0.1 K/uL   Immature Granulocytes 1 %   Abs Immature Granulocytes 0.07 0.00 - 0.07 K/uL    Comment: Performed at [facility], [address] [place], [place] [address]  Comprehensive metabolic panel     Status: Abnormal    Collection Time: 04/11/22 12:30 AM  Result Value Ref Range   Sodium 139 135 - 145 mmol/L   Potassium 3.9 3.5 - 5.1 mmol/L   Chloride 105 98 - 111 mmol/L   CO2 15 (L) 22 - 32 mmol/L   Glucose, Bld 152 (H) 70 - 99 mg/dL    Comment: Glucose reference range applies only to samples taken after fasting for at least 8 hours.   BUN 11 6 - 20 mg/dL   Creatinine, Ser 1.60 (H) 0.44 - 1.00 mg/dL   Calcium 9.2 8.9 - 73.7 mg/dL   Total Protein 7.4 6.5 - 8.1 g/dL   Albumin 3.8 3.5 - 5.0 g/dL   AST 38 15 - 41 U/L   ALT 24 0 - 44 U/L   Alkaline Phosphatase 68 38 - 126 U/L   Total Bilirubin 0.7 0.3 - 1.2 mg/dL   GFR, Estimated >10 >62 mL/min    Comment: (NOTE) Calculated using the CKD-EPI Creatinine Equation (2021)    Anion gap 19 (H) 5 - 15    Comment: Performed at [facility], [address], [place], [place] [address]  Lactic acid, plasma     Status: None   Collection Time: 04/11/22  9:34 AM  Result Value Ref Range   Lactic Acid, Venous 1.1 0.5 - 1.9 mmol/L    Comment: Performed at [facility], [address] [place], [place] [address]  Rapid urine drug screen (hospital performed)     Status: Abnormal   Collection Time: 04/11/22 10:45 AM  Result Value Ref Range   Opiates NONE DETECTED NONE DETECTED   Cocaine POSITIVE (A) NONE DETECTED   Benzodiazepines POSITIVE (A) NONE DETECTED   Amphetamines NONE DETECTED NONE DETECTED   Tetrahydrocannabinol NONE DETECTED NONE DETECTED   Barbiturates NONE DETECTED NONE DETECTED    Comment: (NOTE) DRUG SCREEN FOR MEDICAL PURPOSES ONLY.  IF CONFIRMATION IS NEEDED FOR ANY PURPOSE, NOTIFY LAB WITHIN 5 DAYS.  LOWEST DETECTABLE LIMITS FOR URINE DRUG SCREEN Drug Class                     Cutoff (ng/mL) Amphetamine and metabolites    1000 Barbiturate and metabolites    200 Benzodiazepine                 200 Tricyclics and metabolites     300 Opiates and metabolites        300 Cocaine and metabolites        300 THC  50 Performed at [facility], [address], [place], [place] [address]   Urinalysis, Routine w reflex microscopic     Status: Abnormal   Collection Time: 04/11/22 10:45 AM  Result Value Ref Range   Color, Urine YELLOW YELLOW   APPearance HAZY (A) CLEAR   Specific Gravity, Urine 1.014 1.005 - 1.030   pH 6.0 5.0 - 8.0   Glucose, UA NEGATIVE NEGATIVE mg/dL   Hgb urine dipstick NEGATIVE NEGATIVE   Bilirubin Urine NEGATIVE NEGATIVE   Ketones, ur 20 (A) NEGATIVE mg/dL   Protein, ur NEGATIVE NEGATIVE mg/dL   Nitrite NEGATIVE NEGATIVE   Leukocytes,Ua MODERATE (A) NEGATIVE   RBC / HPF 11-20 0 - 5 RBC/hpf   WBC, UA 21-50 0 - 5 WBC/hpf   Bacteria, UA FEW (A) NONE SEEN   Squamous Epithelial / LPF 6-10 0 - 5   Trans Epithel, UA NONE SEEN     Comment: CORRECTED RESULTS CALLED TOJolayne Panther RN @ 1203 04/11/22 LEONARD,A CORRECTED ON 08/03 AT 1203: PREVIOUSLY REPORTED AS <1    Urine-Other TRICHOMONAS PRESENT     Comment: Performed at [facility], [address] [place], [place] [address]   MR BRAIN WO CONTRAST  Result Date: 04/11/2022 CLINICAL DATA:  Seizures. EXAM: MRI HEAD WITHOUT CONTRAST TECHNIQUE: Multiplanar, multiecho pulse sequences of the brain and surrounding structures were obtained without intravenous contrast. COMPARISON:  Head CT 04/11/2022 FINDINGS: Despite receiving premedication, the patient could not tolerate the examination which had to be terminated. Only localizer and axial diffusion weighted sequences were obtained and are severely motion degraded. No large acute infarct is evident. There is no midline shift or sizable extra-axial fluid collection. The ventricles are normal in size. IMPRESSION: Severely motion degraded, incomplete examination. No large acute infarct. Electronically Signed   By: Sebastian Ache M.D.   On: 04/11/2022 13:21   EEG adult  Result Date: 04/11/2022 Charlsie Quest, MD     04/11/2022 11:09 AM Patient Name: Theresa Rios MRN: [id number]  Epilepsy Attending: Charlsie Quest Referring Physician/Provider: Jefferson Fuel, MD Date: 04/11/2022 Duration: 23.20 mins Patient history: 56 y.o. female with past medical history of seizures on no medications, MDD, ETOH use, cocaine use and smoker, recent hospitalization @ [facility] suspected overdose of melatoninfor who presented to the ED for 3 witnessed seizures at home. EEG to evaluate for seizure Level of alertness: Awake, asleep AEDs during EEG study: LEV, Ativan Technical aspects: This EEG study was done with scalp electrodes positioned according to the 10-20 International system of electrode placement. Electrical activity was reviewed with band pass filter of 1-70Hz , sensitivity of 7 uV/mm, display speed of 90mm/sec with a 60Hz  notched filter applied as appropriate. EEG data were recorded continuously and digitally stored.  Video monitoring was available and reviewed as appropriate. Description: No clear posterior dominant rhythm was seen. Sleep was characterized by sleep spindles (12 to 14 Hz), maximal frontocentral region.  There is an excessive amount of 15 to 18 Hz beta activity distributed symmetrically and diffusely. Hyperventilation and photic stimulation were not performed.   ABNORMALITY - Excessive beta, generalized IMPRESSION: This study is within normal limits. The excessive beta activity seen in the background is most likely due to the effect of benzodiazepine and is a benign EEG pattern. No seizures or epileptiform discharges were seen throughout the recording.   CT Head Wo Contrast  Result Date: 04/11/2022 CLINICAL DATA:  Seizure. EXAM: CT HEAD WITHOUT CONTRAST TECHNIQUE: Contiguous axial  images were obtained from the base of the skull through the vertex without intravenous contrast. RADIATION DOSE REDUCTION: This exam was performed according to the departmental dose-optimization program which includes automated exposure control, adjustment of the mA and/or kV according to  patient size and/or use of iterative reconstruction technique. COMPARISON:  March 14, 2022. FINDINGS: Brain: No evidence of acute infarction, hemorrhage, hydrocephalus, extra-axial collection or mass lesion/mass effect. Vascular: No hyperdense vessel or unexpected calcification. Skull: Normal. Negative for fracture or focal lesion. Sinuses/Orbits: No acute finding. Other: None. IMPRESSION: No acute intracranial abnormality seen. Electronically Signed   By: Lupita Raider M.D.   On: 04/11/2022 08:57   DG Chest Port 1 View  Result Date: 04/11/2022 CLINICAL DATA:  Recent seizure activity EXAM: PORTABLE CHEST 1 VIEW COMPARISON:  03/14/2022 FINDINGS: Cardiac shadow is enlarged but stable. The lungs are well aerated bilaterally. Mild right basilar atelectasis is seen. No acute bony abnormality is noted. IMPRESSION: Mild right basilar atelectasis. Electronically Signed   By: Alcide Clever M.D.   On: 04/11/2022 01:32    Pending Labs Unresulted Labs (From admission, onward)     Start     Ordered   04/12/22 0500  Basic metabolic panel  Tomorrow morning,   R        04/11/22 0927   04/12/22 0500  CBC  Tomorrow morning,   R        04/11/22 0927   04/11/22 0852  Miscellaneous [facility] test (send-out)  Once,   URGENT       Question:  Test name / description:  Answer:  TEST ID: [id number] fentanyl and analogues, urine - send out to [facility]   04/11/22 0852   04/11/22 0851  Miscellaneous [facility] test (send-out)  Once,   URGENT       Question:  Test name / description:  Answer:  TEST ID: fentanyl and metabolite serum/plasma/whole blood send out to [facility]   04/11/22 0852            Vitals/Pain Today's Vitals   04/11/22 1405 04/11/22 1411 04/11/22 1430 04/11/22 1433  BP: (!) 148/79  (!) 145/78   Pulse: 73  78   Resp:   19   Temp:  99 F (37.2 C)    TempSrc:  Oral    SpO2:   98%   Weight:      Height:      PainSc:    Asleep    Isolation Precautions No active isolations  Medications Medications   thiamine (VITAMIN B1) 500 mg in normal saline (50 mL) IVPB (0 mg Intravenous Stopped 04/11/22 1042)    Followed by  thiamine (VITAMIN B1) 250 mg in sodium chloride 0.9 % 50 mL IVPB (has no administration in time range)    Followed by  thiamine (VITAMIN B1) injection 100 mg (has no administration in time range)  levETIRAcetam (KEPPRA) IVPB 1000 mg/100 mL premix (has no administration in time range)  LORazepam (ATIVAN) tablet 1-4 mg (has no administration in time range)    Or  LORazepam (ATIVAN) injection 1-4 mg (has no administration in time range)  folic acid (FOLVITE) tablet 1 mg (1 mg Oral Not Given 04/11/22 0955)  multivitamin with minerals tablet 1 tablet (1 tablet Oral Not Given 04/11/22 0955)  enoxaparin (LOVENOX) injection 40 mg (40 mg Subcutaneous Given 04/11/22 1003)  acetaminophen (TYLENOL) tablet 650 mg (650 mg Oral Given 04/11/22 1117)  sodium chloride 0.9 % bolus 2,000 mL (0 mLs Intravenous Stopped 04/11/22 0509)  ibuprofen (  ADVIL) tablet 400 mg (400 mg Oral Given 04/11/22 0348)  LORazepam (ATIVAN) injection 1 mg (1 mg Intravenous Given 04/11/22 0705)  levETIRAcetam (KEPPRA) IVPB 1500 mg/ 100 mL premix (0 mg Intravenous Stopped 04/11/22 0835)  levETIRAcetam (KEPPRA) IVPB 1500 mg/ 100 mL premix (0 mg Intravenous Stopped 04/11/22 0759)    Mobility walks High fall risk   Focused Assessments    R Recommendations: See Admitting Provider Note  Report given to:   Additional Notes:

## 2022-04-11 NOTE — ED Notes (Signed)
ED provider at bedside at this time 

## 2022-04-11 NOTE — H&P (Addendum)
Date: 04/11/2022               Patient Name:  Theresa Rios MRN: [id number]  DOB: 05/24/1966 Age / Sex: 56 y.o., female   PCP: Hilbert Corrigan Chales Abrahams, NP         Medical Service: Internal Medicine Teaching Service         Attending Physician: Dr. Inez Catalina, MD    First Contact: Dr. Rocky Morel Pager: [phone]  Second Contact: Dr. Elenore Paddy Pager: [phone]       After Hours (After 5p/  First Contact Pager: [phone]  weekends / holidays): Second Contact Pager: [phone]   Chief Concern: Seizure  History of Present Illness:   Theresa Rios is a 56 year old female with past medical history of alcohol use disorder, major depressive disorder, seizure not on medication, who presented to the hospital after having 3 seizure episodes.  Very limited history obtained from patient due to postictal state.  Patient was able to mumble her name, place but is not oriented to time.  She answered 1968 when asked about the year.  She states that last alcoholic drink was 2 days ago but could not tell me how much she was drinking.  I obtain more history from her spouse Theresa Rios.  He reports history of seizure 10 years ago but patient was not on medication.  They were not aware of the underlying cause of his seizure.  He reports we seizures episode last night when patient was lying on bed.  States that she has about 10 to 15 minutes between each episode.  He said the patient was in good health up until this episode.  Declining lack of sleep.  Patient is more stressed lately with her behavioral health issue.  Husband said the patient has been drinking alcohol for at least 40 years.  He is not sure the amount daily, which depends on how much money patient has.  Typically she will drink 12 pack of beer a day.  Her last drink was yesterday but she only had 1 can of beer.  He is not aware of any alcohol withdrawal in the past.  York Spaniel the patient was never hospitalized for alcohol  withdrawal.  Patient was recently admitted to inpatient psych on 7/13 for intentional overdose on melatonin.  She also underwent alcohol detoxification protocol there.  She was started on Zoloft, gabapentin, trazodone and hydroxyzine.  Unfortunately husband states that patient resumed alcohol consumption after discharge.  EMS did witness seizure episode and patient received 2.5 mg of Versed.  Right before discharge, patient had another seizure episode in the emergency room around 7 AM which was aborted with 1 mg of Ativan.  She was loaded with Keppra 3000 mg and 1g BID.   Meds:  No outpatient medications have been marked as taking for the 04/11/22 encounter [facility] Encounter).    Allergies: Allergies as of 04/11/2022   (No Known Allergies)   Past Medical History:  Diagnosis Date   Seizures (HCC)     Family History:  -Unable to obtain due to mental status -Husband is unsure of any pertinent family medical history  Social History:  Lives with her spouse Does not work Drink alcohol daily, per HPI Smoke 1 pack a day for many years Husband reports marijuana and cocaine use  Review of Systems: A complete ROS was negative except as per HPI. Per HPI  Physical Exam: Blood pressure (!) 140/94, pulse 87, temperature 99 F (37.2 C),  temperature source Oral, resp. rate 20, height 5\' 5"  (1.651 m), weight 70 kg, last menstrual period 05/17/2014, SpO2 96 %. Physical Exam Constitutional:      General: She is not in acute distress.    Appearance: She is not ill-appearing.  HENT:     Head: Normocephalic and atraumatic.  Eyes:     General:        Right eye: No discharge.        Left eye: No discharge.     Conjunctiva/sclera: Conjunctivae normal.  Cardiovascular:     Rate and Rhythm: Normal rate and regular rhythm.  Pulmonary:     Effort: Pulmonary effort is normal. No respiratory distress.     Breath sounds: Normal breath sounds. No wheezing.  Abdominal:     General: Bowel sounds are  normal. There is no distension.     Palpations: Abdomen is soft.     Tenderness: There is no abdominal tenderness.  Musculoskeletal:        General: No deformity or signs of injury. Normal range of motion.     Right lower leg: No edema.     Left lower leg: No edema.  Skin:    General: Skin is warm.  Neurological:     Mental Status: She is alert.     Comments: She is somnolent but awakes to verbal stimulation.  She falls back to sleep fairly quickly during the interview.  She was able to mumble her name and place but is not oriented to time.  Unable to perform a full neuro exam given postictal state.      EKG: personally reviewed my interpretation is normal sinus rhythm  CXR: personally reviewed my interpretation is mild atelectasis in the right base  Assessment & Plan by Problem: Principal Problem:   Seizure (HCC) Active Problems:   MDD (major depressive episode), single episode, severe, no psychosis (HCC)   Alcohol use disorder  Theresa Rios is a 56 year old female with past medical history of alcohol use disorder, major depressive disorder, seizure, who was admitted for multiple witnessed seizure, likely secondary to alcohol withdrawal.  Alcohol withdrawal seizure Most likely underlying cause is related to alcohol use disorder.  She also use other substances such as marijuana and cocaine which higher risk.  Otherwise no signs of infection, no structural change on CT head for lack of sleep recently. Her last alcoholic drink was about 2 days ago ethanol level confirms this. -Appreciate neurology recommendation -Pending for MRI and EEG -UDS -Loaded with Keppra 3 g. Continue Keppra 1 g BID -CIWA with Ativan protocol -Thiamine and folic acid -N.p.o. until improvement of mental status -TOC for substance use disorder  Anion gap metabolic acidosis This is either lactic acidosis vs starvation ketosis in the setting of alcohol use.  -Pending UA and lactic acid -BMP in  a.m.  AKI Baseline Cr 0.5. Likely pre-renal. S/p 2L bolus NS -BMP in AM  Major depressive disorder She is following [facility] last seen on 7/26.  No suicidal -Will resume Zoloft when she is more awake  Full code Diet n.p.o. VTE: Lovenox IVF: s/p 2L NS  Dispo: Admit patient to Inpatient with expected length of stay greater than 2 midnights.  Signed: 8/26, DO 04/11/2022, 10:10 AM  Pager: [phone] After 5pm on weekdays and 1pm on weekends: On Call pager: [phone]

## 2022-04-11 NOTE — ED Notes (Signed)
Pt's husband and neurology at bedside

## 2022-04-11 NOTE — ED Notes (Signed)
ED provider at bedside.

## 2022-04-11 NOTE — Progress Notes (Signed)
Patient arrived on floor from ED.  Patient hooked up to telemetry monitor.  Alert and oriented x4 but tired and falls asleep while answering questions..  Patient is currently hooked up to EEG monitor in room.  Will continue to monitor.

## 2022-04-11 NOTE — ED Notes (Signed)
This NT Dc'd the patients IV and obtained vitals. Patient had used the bedside commode without calling for assistance. The patient grabbed a chuck and wrapped it around her head.This NT moved the patient to H19 to wait on her ride per Bonita Quin, Charity fundraiser.

## 2022-04-11 NOTE — ED Notes (Addendum)
Dr. Cyndie Chime at bedside; pt alert to voice, answering questions appropriately

## 2022-04-11 NOTE — ED Notes (Signed)
This NT was just notified by Bonita Quin, RN that the patient had gotten up again and had a bowel movement. This NT and Nurse went to assess further.The patient had left a wash bin on the floor full of urine, and urine cups on the counter. Urine was all over the floor. Chucks with the bowel movement are in the trash.This NT cleaned up, and educated the patient on the call button again, patient acknowledged. Nurse aware.

## 2022-04-12 ENCOUNTER — Inpatient Hospital Stay (HOSPITAL_COMMUNITY): Payer: Self-pay

## 2022-04-12 DIAGNOSIS — N390 Urinary tract infection, site not specified: Secondary | ICD-10-CM

## 2022-04-12 LAB — CBC
HCT: 39.6 % (ref 36.0–46.0)
Hemoglobin: 13.3 g/dL (ref 12.0–15.0)
MCH: 28.5 pg (ref 26.0–34.0)
MCHC: 33.6 g/dL (ref 30.0–36.0)
MCV: 84.8 fL (ref 80.0–100.0)
Platelets: 204 10*3/uL (ref 150–400)
RBC: 4.67 MIL/uL (ref 3.87–5.11)
RDW: 13.8 % (ref 11.5–15.5)
WBC: 7.2 10*3/uL (ref 4.0–10.5)
nRBC: 0 % (ref 0.0–0.2)

## 2022-04-12 LAB — BASIC METABOLIC PANEL
Anion gap: 13 (ref 5–15)
BUN: 8 mg/dL (ref 6–20)
CO2: 22 mmol/L (ref 22–32)
Calcium: 9.5 mg/dL (ref 8.9–10.3)
Chloride: 104 mmol/L (ref 98–111)
Creatinine, Ser: 0.73 mg/dL (ref 0.44–1.00)
GFR, Estimated: >60 mL/min (ref >60)
Glucose, Bld: 79 mg/dL (ref 70–99)
Potassium: 3.1 mmol/L — ABNORMAL LOW (ref 3.5–5.1)
Sodium: 139 mmol/L (ref 135–145)

## 2022-04-12 MED ORDER — LOPERAMIDE HCL 2 MG PO CAPS: 2.0000 mg | ORAL_CAPSULE | ORAL | Status: DC | PRN

## 2022-04-12 MED ORDER — SODIUM CHLORIDE 0.9 % IV SOLN: INTRAVENOUS | Status: DC

## 2022-04-12 MED ORDER — METRONIDAZOLE 500 MG PO TABS
500.0000 mg | ORAL_TABLET | Freq: Two times a day (BID) | ORAL | Status: DC
  Administered 2022-04-12 – 2022-04-13 (×3): 500 mg via ORAL
  Filled 2022-04-12 (×3): qty 1

## 2022-04-12 MED ORDER — CHLORDIAZEPOXIDE HCL 5 MG PO CAPS
25.0000 mg | ORAL_CAPSULE | Freq: Four times a day (QID) | ORAL | Status: AC
  Administered 2022-04-12 (×4): 25 mg via ORAL
  Filled 2022-04-12 (×4): qty 5

## 2022-04-12 MED ORDER — ONDANSETRON 4 MG PO TBDP: 4.0000 mg | ORAL_TABLET | Freq: Four times a day (QID) | ORAL | Status: DC | PRN

## 2022-04-12 MED ORDER — CHLORDIAZEPOXIDE HCL 5 MG PO CAPS: 25.0000 mg | ORAL_CAPSULE | Freq: Every day | ORAL | Status: DC

## 2022-04-12 MED ORDER — ORAL CARE MOUTH RINSE
15.0000 mL | OROMUCOSAL | Status: DC | PRN
Start: 2022-04-12 — End: 2022-04-13

## 2022-04-12 MED ORDER — LORAZEPAM 2 MG/ML IJ SOLN
1.0000 mg | Freq: Once | INTRAMUSCULAR | Status: DC | PRN
Start: 2022-04-12 — End: 2022-04-12
  Filled 2022-04-12: qty 1

## 2022-04-12 MED ORDER — LORAZEPAM 2 MG/ML IJ SOLN
1.0000 mg | Freq: Once | INTRAMUSCULAR | Status: AC | PRN
  Administered 2022-04-12: 1 mg via INTRAVENOUS
  Filled 2022-04-12: qty 1

## 2022-04-12 MED ORDER — SERTRALINE HCL 100 MG PO TABS
100.0000 mg | ORAL_TABLET | Freq: Every day | ORAL | Status: DC
  Administered 2022-04-12 – 2022-04-13 (×2): 100 mg via ORAL
  Filled 2022-04-12 (×2): qty 1

## 2022-04-12 MED ORDER — CHLORDIAZEPOXIDE HCL 5 MG PO CAPS: 25.0000 mg | ORAL_CAPSULE | ORAL | Status: DC

## 2022-04-12 MED ORDER — CHLORDIAZEPOXIDE HCL 5 MG PO CAPS
25.0000 mg | ORAL_CAPSULE | Freq: Three times a day (TID) | ORAL | Status: DC
  Administered 2022-04-13: 25 mg via ORAL
  Filled 2022-04-12: qty 5

## 2022-04-12 MED ORDER — HYDROMORPHONE HCL 1 MG/ML IJ SOLN
0.5000 mg | Freq: Once | INTRAMUSCULAR | Status: AC
  Administered 2022-04-13: 0.5 mg via INTRAVENOUS
  Filled 2022-04-12: qty 0.5

## 2022-04-12 NOTE — Progress Notes (Addendum)
HD#1 Subjective:   Summary: Patient resting in bed comfortably and wakes up easily to her name.  She states that she is tired and hungry but denies any complaints overnight and states her back pain that she was having last night was relieved by the lidocaine patch.  Overnight Events: Per report CIWA's were consistently 0     Objective:  Vital signs in last 24 hours: Vitals:   04/12/22 0013 04/12/22 0434 04/12/22 0500 04/12/22 0700  BP: (!) 154/82 136/81  (!) 140/84  Pulse: 64 66  73  Resp: 20 18  18   Temp: 98.4 F (36.9 C) 97.9 F (36.6 C)  97.9 F (36.6 C)  TempSrc: Oral Oral  Oral  SpO2: 98% 97%  97%  Weight:   69 kg   Height:       Supplemental O2: Room Air SpO2: 97 % O2 Flow Rate (L/min): 2 L/min   Physical Exam:  Constitutional: well-appearing female sitting in bed with EEG stickers on her head, in no acute distress HENT: normocephalic atraumatic Eyes: conjunctiva non-erythematous Cardiovascular: regular rate and rhythm Pulmonary/Chest: normal work of breathing on room air, lungs clear to auscultation bilaterally Abdominal: soft, non-tender, non-distended MSK: normal bulk and tone Neurological: alert & oriented x 3 Skin: warm and dry Psych: Affect appropriate for situation.  Filed Weights   04/11/22 0343 04/12/22 0500  Weight: 70 kg 69 kg     Intake/Output Summary (Last 24 hours) at 04/12/2022 1108 Last data filed at 04/12/2022 0000 Gross per 24 hour  Intake 249.38 ml  Output --  Net 249.38 ml   Net IO Since Admission: 2,249.38 mL [04/12/22 1108]  Pertinent Labs:    Latest Ref Rng & Units 04/12/2022    5:58 AM 04/11/2022   12:30 AM 03/19/2022    6:51 AM  CBC  WBC 4.0 - 10.5 K/uL 7.2  14.2  6.9   Hemoglobin 12.0 - 15.0 g/dL 05/20/2022  64.4  03.4   Hematocrit 36.0 - 46.0 % 39.6  43.6  41.3   Platelets 150 - 400 K/uL 204  253  233        Latest Ref Rng & Units 04/12/2022    5:58 AM 04/11/2022   12:30 AM 03/19/2022    6:51 AM  CMP  Glucose 70 - 99 mg/dL  79  05/20/2022  94   BUN 6 - 20 mg/dL 8  11  8    Creatinine 0.44 - 1.00 mg/dL 595   6.38   Sodium 135 - 145 mmol/L 139  139  144   Potassium 3.5 - 5.1 mmol/L 3.1  3.9  3.6   Chloride 98 - 111 mmol/L 104  105  108   CO2 22 - 32 mmol/L 22  15  22    Calcium 8.9 - 10.3 mg/dL 9.5  9.2  9.9   Total Protein 6.5 - 8.1 g/dL  7.4  7.7   Total Bilirubin 0.3 - 1.2 mg/dL  0.7  0.4   Alkaline Phos 38 - 126 U/L  68  68   AST 15 - 41 U/L  38  22   ALT 0 - 44 U/L  24  21     Imaging: Overnight EEG with video  Result Date: 04/12/2022 4.33, MD     04/12/2022  9:05 AM Patient Name: Theresa Rios MRN: Theresa Rios Epilepsy Attending: 06/12/2022 Referring Physician/Provider: Otilio Connors, NP  Duration: 04/11/2022 1005 to 04/12/2022 0900  Patient history: 56  y.o. female with past medical history of seizures on no medications, MDD, ETOH use, cocaine use and smoker, recent hospitalization @ [facility] suspected overdose of melatoninfor who presented to the ED for 3 witnessed seizures at home. EEG to evaluate for seizure  Level of alertness: Awake, asleep  AEDs during EEG study: LEV, Chlordiazepoxide  Technical aspects: This EEG study was done with scalp electrodes positioned according to the 10-20 International system of electrode placement. Electrical activity was reviewed with band pass filter of 1-70Hz , sensitivity of 7 uV/mm, display speed of 25mm/sec with a 60Hz  notched filter applied as appropriate. EEG data were recorded continuously and digitally stored.  Video monitoring was available and reviewed as appropriate.  Description: No clear posterior dominant rhythm was seen. Sleep was characterized by sleep spindles (12 to 14 Hz), maximal frontocentral region.  There is an excessive amount of 15 to 18 Hz beta activity distributed symmetrically and diffusely. Hyperventilation and photic stimulation were not performed.    ABNORMALITY - Excessive beta, generalized  IMPRESSION: This study is within normal limits. The  excessive beta activity seen in the background is most likely due to the effect of benzodiazepine and is a benign EEG pattern. No seizures or epileptiform discharges were seen throughout the recording.    MR BRAIN WO CONTRAST  Result Date: 04/11/2022 CLINICAL DATA:  Seizures. EXAM: MRI HEAD WITHOUT CONTRAST TECHNIQUE: Multiplanar, multiecho pulse sequences of the brain and surrounding structures were obtained without intravenous contrast. COMPARISON:  Head CT 04/11/2022 FINDINGS: Despite receiving premedication, the patient could not tolerate the examination which had to be terminated. Only localizer and axial diffusion weighted sequences were obtained and are severely motion degraded. No large acute infarct is evident. There is no midline shift or sizable extra-axial fluid collection. The ventricles are normal in size. IMPRESSION: Severely motion degraded, incomplete examination. No large acute infarct. Electronically Signed   By: 06/11/2022 M.D.   On: 04/11/2022 13:21    Assessment/Plan:   Principal Problem:   Seizure (HCC) Active Problems:   MDD (major depressive episode), single episode, severe, no psychosis (HCC)   Alcohol use disorder   Patient Summary: Theresa Rios is a 56 y.o. with a pertinent PMH of alcohol use disorder, major depressive disorder, and seizures who presented with recurrent seizures and admitted for evaluation and management suspected alcohol withdrawal seizures.   Alcohol withdrawal seizure Most likely due to alcohol withdrawal possible other substance abuse (positive UDS for cocaine and benzodiazepines).  EEG was unremarkable and neuro continues to follow. -Appreciate neurology recommendation -Repeat MRI brain due to severe motion artifact on first attempt -Continue Keppra 1 g BID -CIWA with Ativan protocol and added Librium taper -Thiamine and folic acid -TOC for substance use disorder   Anion gap metabolic acidosis Likely starvation ketosis  at this resolved with fluids and p.o. intake.  No signs of lactic acidosis. -Continue to monitor with daily BMP   AKI Baseline Cr 0.5.  Creatinine down from 1.01-0.73 Likely pre-renal. S/p 2L bolus NS and currently on 125 mL/h -Continue to monitor with daily BMP   Major depressive disorder She is following [facility] last seen on 7/26.  No suicidal -Resume home Zoloft  UTI UA showed leukocytes, bacteria, white blood cells, and trichomonas was noted on microscopic evaluation. - We will start metronidazole 500 mg twice daily for 7 days (stop date August 11)    Diet: Normal IVF: NS,125cc/hr VTE: Enoxaparin Code: Full PT/OT recs: None, none. TOC  recs: Pending   Dispo: Anticipated discharge to Home pending further work-up.  Rocky Morel DO Internal Medicine Resident PGY-1 Pager: [phone]  Please contact the on call pager after 5 pm and on weekends at [phone].

## 2022-04-12 NOTE — Plan of Care (Signed)
Neurology brief update  Please see neurology consult note from yesterday for full findings and recommendations. EEG WNL. Patient has refused MRI brain multiple times. As such, no further inpatient neurologic workup indicated.   Final recommendations: - Keppra 1000MG  po BID - given recurrent episodes of withdrawal-related seizures patient should remain on AED until she is sober, and at that time may be titrated off slowly at the discretion of her outpatient neurologist. Please give patient multiple refills in case she gets lost to outpatient f/u for some time - Continue thiamine 100mg  daily - EtOH/ substance abuse counseling - Driving restrictions x6 mos after last seizure (pt should be counseled when no longer sedated) - Amb referral to neurology at time of discharge  Neurology to sign off, but please re-engage if any new neurologic concerns arise.  , MD [facility] [phone]  If 7pm- 7am, please page neurology on call as listed in AMION.

## 2022-04-12 NOTE — Progress Notes (Signed)
LTM disconnected. [facility] notified.  No visible skin breakdown.

## 2022-04-12 NOTE — Procedures (Addendum)
Patient Name: Theresa Rios  MRN: [id number]  Epilepsy Attending: Charlsie Quest  Referring Physician/Provider: Mathews Argyle, NP   Duration: 04/11/2022 1005 to 04/12/2022 1052   Patient history: 56 y.o. female with past medical history of seizures on no medications, MDD, ETOH use, cocaine use and smoker, recent hospitalization @ [facility] suspected overdose of melatoninfor who presented to the ED for 3 witnessed seizures at home. EEG to evaluate for seizure   Level of alertness: Awake, asleep   AEDs during EEG study: LEV, Chlordiazepoxide   Technical aspects: This EEG study was done with scalp electrodes positioned according to the 10-20 International system of electrode placement. Electrical activity was reviewed with band pass filter of 1-70Hz , sensitivity of 7 uV/mm, display speed of 47mm/sec with a 60Hz  notched filter applied as appropriate. EEG data were recorded continuously and digitally stored.  Video monitoring was available and reviewed as appropriate.   Description: No clear posterior dominant rhythm was seen. Sleep was characterized by sleep spindles (12 to 14 Hz), maximal frontocentral region.  There is an excessive amount of 15 to 18 Hz beta activity distributed symmetrically and diffusely. Hyperventilation and photic stimulation were not performed.      ABNORMALITY - Excessive beta, generalized   IMPRESSION: This study is within normal limits. The excessive beta activity seen in the background is most likely due to the effect of benzodiazepine and is a benign EEG pattern. No seizures or epileptiform discharges were seen throughout the recording.   Shaday Rayborn 

## 2022-04-12 NOTE — TOC CAGE-AID Note (Signed)
Transition of Care [facility]) - CAGE-AID Screening   Patient Details  Name: Theresa Rios MRN: [id number] Date of Birth: October 12, 1965  Transition of Care [facility]) CM/SW Contact:    Kermit Balo, RN Phone Number: 04/12/2022, 12:26 PM   Clinical Narrative: Pt offered inpatient/ outpatient alcohol/ drug counseling resources and she refused.    CAGE-AID Screening:    Have You Ever Felt You Ought to Cut Down on Your Drinking or Drug Use?: Yes Have People Annoyed You By Critizing Your Drinking Or Drug Use?: No Have You Felt Bad Or Guilty About Your Drinking Or Drug Use?: Yes Have You Ever Had a Drink or Used Drugs First Thing In The Morning to Steady Your Nerves or to Get Rid of a Hangover?: No CAGE-AID Score: 2  Substance Abuse Education Offered: Yes (pt refused)

## 2022-04-12 NOTE — Progress Notes (Signed)
Performed maintenance.  Fixed EKG.  All under 10.  No visible skin breakdown.

## 2022-04-12 NOTE — TOC Initial Note (Signed)
Transition of Care [facility]) - Initial/Assessment Note    Patient Details  Name: Theresa Rios MRN: [id number] Date of Birth: Oct 26, 1965  Transition of Care [facility]) CM/SW Contact:    Kermit Balo, RN Phone Number: 04/12/2022, 12:28 PM  Clinical Narrative:                 Pt is from home with her spouse that works during the daytime. She doesn't have any DME. She states she is not on any home medications.  She goes to the [facility] for her PCP as she has no health insurance. [facility] Any d/c medications will need to go through the [facility] pharmacy.  Her mother in law provides needed transportation.  TOC following.  Expected Discharge Plan: Home/Self Care Barriers to Discharge: Continued Medical Work up   Patient Goals and CMS Choice        Expected Discharge Plan and Services Expected Discharge Plan: Home/Self Care   Discharge Planning Services: CM Consult   Living arrangements for the past 2 months: Apartment                                      Prior Living Arrangements/Services Living arrangements for the past 2 months: Apartment Lives with:: Spouse Patient language and need for interpreter reviewed:: Yes Do you feel safe going back to the place where you live?: Yes        Care giver support system in place?: Yes (comment) (spouse works during the day)   Criminal Activity/Legal Involvement Pertinent to Current Situation/Hospitalization: No - Comment as needed  Activities of Daily Living      Permission Sought/Granted                  Emotional Assessment Appearance:: Appears stated age Attitude/Demeanor/Rapport: Engaged Affect (typically observed): Accepting Orientation: : Oriented to Self, Oriented to Place, Oriented to  Time, Oriented to Situation Alcohol / Substance Use: Alcohol Use, Illicit Drugs Psych Involvement: No (comment)  Admission diagnosis:  Seizure [facility]) [R56.9] Patient Active Problem  List   Diagnosis Date Noted   Seizure (HCC) 04/11/2022   MDD (major depressive episode), single episode, severe, no psychosis (HCC) 03/21/2022   Alcohol use disorder 03/21/2022   Anxiety state 03/21/2022   Insomnia 03/21/2022   Suicide attempt by drug ingestion (HCC) 03/19/2022   PCP:  Lavinia Sharps, NP Pharmacy:   [facility] [id number]), [place] - [address] [place] ([place]) [place] [address] Phone: [phone] Fax: [phone]  [facility] - [place], [place] - [address] [place] [address] Phone: [phone] Fax: [phone]     Social Determinants of Health (SDOH) Interventions    Readmission Risk Interventions     No data to display

## 2022-04-13 ENCOUNTER — Inpatient Hospital Stay (HOSPITAL_COMMUNITY): Payer: Self-pay

## 2022-04-13 LAB — BASIC METABOLIC PANEL
Anion gap: 11 (ref 5–15)
BUN: 13 mg/dL (ref 6–20)
CO2: 22 mmol/L (ref 22–32)
Calcium: 9.4 mg/dL (ref 8.9–10.3)
Chloride: 104 mmol/L (ref 98–111)
Creatinine, Ser: 0.7 mg/dL (ref 0.44–1.00)
GFR, Estimated: >60 mL/min (ref >60)
Glucose, Bld: 124 mg/dL — ABNORMAL HIGH (ref 70–99)
Potassium: 3.6 mmol/L (ref 3.5–5.1)
Sodium: 137 mmol/L (ref 135–145)

## 2022-04-13 LAB — CBC
HCT: 39.2 % (ref 36.0–46.0)
Hemoglobin: 13.2 g/dL (ref 12.0–15.0)
MCH: 28.6 pg (ref 26.0–34.0)
MCHC: 33.7 g/dL (ref 30.0–36.0)
MCV: 84.8 fL (ref 80.0–100.0)
Platelets: 200 10*3/uL (ref 150–400)
RBC: 4.62 MIL/uL (ref 3.87–5.11)
RDW: 13.5 % (ref 11.5–15.5)
WBC: 8.9 10*3/uL (ref 4.0–10.5)
nRBC: 0 % (ref 0.0–0.2)

## 2022-04-13 MED ORDER — LIDOCAINE 5 % EX PTCH
1.0000 | MEDICATED_PATCH | CUTANEOUS | 0 refills | Status: DC
Start: 2022-04-13 — End: 2022-05-07

## 2022-04-13 MED ORDER — METRONIDAZOLE 500 MG PO TABS
500.0000 mg | ORAL_TABLET | Freq: Two times a day (BID) | ORAL | 0 refills | Status: DC
Start: 2022-04-13 — End: 2022-05-07

## 2022-04-13 MED ORDER — LORAZEPAM 2 MG/ML IJ SOLN
2.0000 mg | Freq: Once | INTRAMUSCULAR | Status: AC
  Administered 2022-04-13: 2 mg via INTRAVENOUS

## 2022-04-13 MED ORDER — LEVETIRACETAM 1000 MG PO TABS: 1000.0000 mg | ORAL_TABLET | Freq: Two times a day (BID) | ORAL | 3 refills | Status: DC

## 2022-04-13 MED ORDER — METRONIDAZOLE 500 MG PO TABS: 500.0000 mg | ORAL_TABLET | Freq: Two times a day (BID) | ORAL | 0 refills | Status: DC

## 2022-04-13 MED ORDER — LEVETIRACETAM IN NACL 1000 MG/100ML IV SOLN
1000.0000 mg | Freq: Two times a day (BID) | INTRAVENOUS | 3 refills | Status: DC
Start: 2022-04-13 — End: 2022-04-13

## 2022-04-13 NOTE — Hospital Course (Signed)
Theresa Rios is a 56 y.o. with a pertinent PMH of alcohol use disorder, major depressive disorder, and seizures who presented with recurrent seizures and admitted for evaluation and management suspected alcohol withdrawal seizures.    Alcohol withdrawal seizure Patient arrived after having multiple seizures roughly 15 minutes apart on Tuesday night after stopping drinking on Monday.  She has a past history of seizures but her last one was over 10 years ago.  Negative alcohol on arrival to ED.  Positive UDS for cocaine and benzodiazepines.  Patient was placed on CIWA with as needed Ativan which she did not require any of.  Neuro was consulted, 3 g loading dose of Keppra and 1 g twice daily was started.  EEG as well as MRI of the brain were performed which were unremarkable.  Neurology recommended patient continue on Keppra and see them at follow-up to slowly taper off.  Librium taper was started while she was here and on day 2 of 4 she expressed her desire to leave the hospital.  We discussed the risks of leaving the hospital at this point in the context of alcohol withdrawal that may have caused a seizure.  We discussed what we are treating with the Librium and as well as what we are treating with the Keppra.  She states her understanding of her situation and that she is going through alcohol withdrawals that we are treating.  We discussed the risk of having more seizures as well as the risk of death if she were to leave the hospital at this point and not finish the Librium taper.  She states her understanding and expressed her desire still be discharged and to continue consuming alcohol when she got home.   Anion gap metabolic acidosis Patient arrived with an anion gap of 19 and bicarb of 15.  This quickly resolved with IV fluid rehydration and was likely secondary to starvation ketosis.  Her anion gap remained closed and her bicarb remained within normal limits for the remainder of her admission.    AKI Patient arrived with a creatinine of 1.01 which was above her baseline Cr 0.5.  Her creatinine trended down from 1.01 to 0.70 after IV fluid rehydration.  We suspect this was likely prerenal in the setting of hypovolemia.   Major depressive disorder She is following [facility] last seen on 7/26.  She did not express any suicidal behavior or ideation during this admission we continued her home Zoloft after her mental status to baseline.   UTI Her admission UA showed leukocytes, bacteria, white blood cells, and trichomonas was noted on microscopic evaluation. We started her on metronidazole 500 mg twice daily for 7 days and we will continue her on this outpatient as well as prescribed some for her husband.

## 2022-04-13 NOTE — Progress Notes (Signed)
Pt denies any back pain or anxiety prior to MRI.   IV dilaudid given prior to MRI to help pt lay comfortably on MRI table. SWOT RN went with pt to monitor.  In MRI notified by SWOT RN that pt still unable to stay still. MD notified and 2mg  ativan IV ordered for MRI and given by SWOT RN.

## 2022-04-13 NOTE — Discharge Summary (Signed)
Name: Theresa Rios MRN: [id number] DOB: Oct 11, 1965 56 y.o. PCP: Lavinia Sharps, NP  Date of Admission: 04/11/2022 12:20 AM Date of Discharge: 04/13/2022 Attending Physician: Dr. Antony Contras  Discharge Diagnosis: Principal Problem:   Seizure [facility]) Active Problems:   MDD (major depressive episode), single episode, severe, no psychosis (HCC)   Alcohol use disorder    Discharge Medications: Allergies as of 04/13/2022   No Known Allergies      Medication List     STOP taking these medications    hydrOXYzine 25 MG tablet Commonly known as: ATARAX   ibuprofen 800 MG tablet Commonly known as: ADVIL       TAKE these medications    cyclobenzaprine 10 MG tablet Commonly known as: FLEXERIL Take 10 mg by mouth 3 (three) times daily as needed for muscle spasms.   gabapentin 300 MG capsule Commonly known as: NEURONTIN Take 2 capsules (600 mg total) by mouth at bedtime. What changed: Another medication with the same name was removed. Continue taking this medication, and follow the directions you see here.   levETIRAcetam 1000 MG/100ML Soln Commonly known as: KEPPRA Inject 100 mLs (1,000 mg total) into the vein every 12 (twelve) hours.   lidocaine 5 % Commonly known as: LIDODERM Place 1 patch onto the skin daily. Remove & Discard patch within 12 hours or as directed by MD   metroNIDAZOLE 500 MG tablet Commonly known as: FLAGYL Take 1 tablet (500 mg total) by mouth 2 (two) times daily. Please take this medication as prescribed for 5 more days and have your husband take it also as prescribed for 7 days.   nicotine 14 mg/24hr patch Commonly known as: NICODERM CQ - dosed in mg/24 hours Place 1 patch (14 mg total) onto the skin daily.   sertraline 100 MG tablet Commonly known as: ZOLOFT Take 1 tablet (100 mg total) by mouth daily.   traZODone 100 MG tablet Commonly known as: DESYREL Take 1 tablet (100 mg total) by mouth at bedtime as needed for sleep.         Disposition and follow-up:   TheresaRitamarie L Rios was discharged from [facility] in Stable condition.  At the hospital follow up visit please address:  1.  Follow-up:  a.  Her alcohol use as we discussed in detail with her the risks of stopping her drinking and that her happening this week could have caused her seizure.  We discussed the options available when and if she wants help with alcohol cessation.    b.  Please discuss her new prescription of Keppra and determine if she is taking it as prescribed.  Please also ask about her neurology follow-up.   c.  Please check CMP to evaluate continued resolution of her metabolic acidosis and AKI.   d.  Please ensure that she and her husband completed the metronidazole prescription for her bacterial vaginosis found during this admission.  2.  Labs / imaging needed at time of follow-up: CBC, CMP, ethanol, UA  3.  Pending labs/ test needing follow-up: None  4.  Medication Changes  Abx -  metronidazole 100 mg twice daily for her and her husband End Date: August 7 for the patient in August 9 for her husband  Follow-up Appointments:  Follow-up Information     Placey, Chales Abrahams, NP Follow up.   Contact information: [address] [place] [address] [phone]         Charlsie Quest, MD Follow up.  Specialty: Neurology Contact information: [address] [place] [address] [phone]                 Hospital Course by problem list: Theresa Rios is a 56 y.o. with a pertinent PMH of alcohol use disorder, major depressive disorder, and seizures who presented with recurrent seizures and admitted for evaluation and management suspected alcohol withdrawal seizures.    Alcohol withdrawal seizure Patient arrived after having multiple seizures roughly 15 minutes apart on Tuesday night after stopping drinking on Monday.  She has a past history of seizures but her last one was over 10 years ago.   Negative alcohol on arrival to ED.  Positive UDS for cocaine and benzodiazepines.  Patient was placed on CIWA with as needed Ativan which she did not require any of.  Neuro was consulted, 3 g loading dose of Keppra and 1 g twice daily was started.  EEG as well as MRI of the brain were performed which were unremarkable.  Neurology recommended patient continue on Keppra and see them at follow-up to slowly taper off.  Librium taper was started while she was here and on day 2 of 4 she expressed her desire to leave the hospital.  We discussed the risks of leaving the hospital at this point in the context of alcohol withdrawal that may have caused a seizure.  We discussed what we are treating with the Librium and as well as what we are treating with the Keppra.  She states her understanding of her situation and that she is going through alcohol withdrawals that we are treating.  We discussed the risk of having more seizures as well as the risk of death if she were to leave the hospital at this point and not finish the Librium taper.  She states her understanding and expressed her desire still be discharged and to continue consuming alcohol when she got home.   Anion gap metabolic acidosis Patient arrived with an anion gap of 19 and bicarb of 15.  This quickly resolved with IV fluid rehydration and was likely secondary to starvation ketosis.  Her anion gap remained closed and her bicarb remained within normal limits for the remainder of her admission.   AKI Patient arrived with a creatinine of 1.01 which was above her baseline Cr 0.5.  Her creatinine trended down from 1.01 to 0.70 after IV fluid rehydration.  We suspect this was likely prerenal in the setting of hypovolemia.   Major depressive disorder She is following [facility] last seen on 7/26.  She did not express any suicidal behavior or ideation during this admission we continued her home Zoloft after her mental status to  baseline.   UTI Her admission UA showed leukocytes, bacteria, white blood cells, and trichomonas was noted on microscopic evaluation. We started her on metronidazole 500 mg twice daily for 7 days and we will continue her on this outpatient as well as prescribed some for her husband.   Discharge Subjective: Patient expressed her desire to leave and stated that she would resume drinking when she went home.  She did not want to stay to finish the Librium taper even if the risk of going home with the risk of having another seizure and even dying.  She explicitly stated her understanding of these risks.   Pertinent Labs, Studies, and Procedures:     Latest Ref Rng & Units 04/13/2022    2:41 AM 04/12/2022    5:58 AM 04/11/2022  12:30 AM  CBC  WBC 4.0 - 10.5 K/uL 8.9  7.2  14.2   Hemoglobin 12.0 - 15.0 g/dL 69.6  29.5  28.4   Hematocrit 36.0 - 46.0 % 39.2  39.6  43.6   Platelets 150 - 400 K/uL 200  204  253        Latest Ref Rng & Units 04/13/2022    2:41 AM 04/12/2022    5:58 AM 04/11/2022   12:30 AM  CMP  Glucose 70 - 99 mg/dL 132  79  440   BUN 6 - 20 mg/dL 13  8  11    Creatinine 0.44 - 1.00 mg/dL  1.02  7.25   Sodium 135 - 145 mmol/L 137  139  139   Potassium 3.5 - 5.1 mmol/L 3.6  3.1  3.9   Chloride 98 - 111 mmol/L 104  104  105   CO2 22 - 32 mmol/L 22  22  15    Calcium 8.9 - 10.3 mg/dL 9.4  9.5  9.2   Total Protein 6.5 - 8.1 g/dL   7.4   Total Bilirubin 0.3 - 1.2 mg/dL   0.7   Alkaline Phos 38 - 126 U/L   68   AST 15 - 41 U/L   38   ALT 0 - 44 U/L   24     Overnight EEG with video  Result Date: 04/12/2022 , MD     04/13/2022 10:55 AM Patient Name: TAREN DYMEK MRN: 06/13/2022 Epilepsy Attending: Otilio Connors Referring Physician/Provider: [id number], NP  Duration: 04/11/2022 1005 to 04/12/2022 1052  Patient history: 56 y.o. female with past medical history of seizures on no medications, MDD, ETOH use, cocaine use and smoker, recent hospitalization @ [facility]  suspected overdose of melatoninfor who presented to the ED for 3 witnessed seizures at home. EEG to evaluate for seizure  Level of alertness: Awake, asleep  AEDs during EEG study: LEV, Chlordiazepoxide  Technical aspects: This EEG study was done with scalp electrodes positioned according to the 10-20 International system of electrode placement. Electrical activity was reviewed with band pass filter of 1-70Hz , sensitivity of 7 uV/mm, display speed of 69mm/sec with a 60Hz  notched filter applied as appropriate. EEG data were recorded continuously and digitally stored.  Video monitoring was available and reviewed as appropriate.  Description: No clear posterior dominant rhythm was seen. Sleep was characterized by sleep spindles (12 to 14 Hz), maximal frontocentral region.  There is an excessive amount of 15 to 18 Hz beta activity distributed symmetrically and diffusely. Hyperventilation and photic stimulation were not performed.    ABNORMALITY - Excessive beta, generalized  IMPRESSION: This study is within normal limits. The excessive beta activity seen in the background is most likely due to the effect of benzodiazepine and is a benign EEG pattern. No seizures or epileptiform discharges were seen throughout the recording.  58   MR BRAIN WO CONTRAST  Result Date: 04/11/2022 CLINICAL DATA:  Seizures. EXAM: MRI HEAD WITHOUT CONTRAST TECHNIQUE: Multiplanar, multiecho pulse sequences of the brain and surrounding structures were obtained without intravenous contrast. COMPARISON:  Head CT 04/11/2022 FINDINGS: Despite receiving premedication, the patient could not tolerate the examination which had to be terminated. Only localizer and axial diffusion weighted sequences were obtained and are severely motion degraded. No large acute infarct is evident. There is no midline shift or sizable extra-axial fluid collection. The ventricles are normal in size. IMPRESSION: Severely motion degraded, incomplete  examination.  No large acute infarct. Electronically Signed   By: Sebastian Ache M.D.   On: 04/11/2022 13:21   EEG adult  Result Date: 04/11/2022 Charlsie Quest, MD     04/11/2022 11:09 AM Patient Name: ONYX EDGLEY MRN: [id number] Epilepsy Attending: Charlsie Quest Referring Physician/Provider: Jefferson Fuel, MD Date: 04/11/2022 Duration: 23.20 mins Patient history: 56 y.o. female with past medical history of seizures on no medications, MDD, ETOH use, cocaine use and smoker, recent hospitalization @ [facility] suspected overdose of melatoninfor who presented to the ED for 3 witnessed seizures at home. EEG to evaluate for seizure Level of alertness: Awake, asleep AEDs during EEG study: LEV, Ativan Technical aspects: This EEG study was done with scalp electrodes positioned according to the 10-20 International system of electrode placement. Electrical activity was reviewed with band pass filter of 1-70Hz , sensitivity of 7 uV/mm, display speed of 23mm/sec with a 60Hz  notched filter applied as appropriate. EEG data were recorded continuously and digitally stored.  Video monitoring was available and reviewed as appropriate. Description: No clear posterior dominant rhythm was seen. Sleep was characterized by sleep spindles (12 to 14 Hz), maximal frontocentral region.  There is an excessive amount of 15 to 18 Hz beta activity distributed symmetrically and diffusely. Hyperventilation and photic stimulation were not performed.   ABNORMALITY - Excessive beta, generalized IMPRESSION: This study is within normal limits. The excessive beta activity seen in the background is most likely due to the effect of benzodiazepine and is a benign EEG pattern. No seizures or epileptiform discharges were seen throughout the recording.   CT Head Wo Contrast  Result Date: 04/11/2022 CLINICAL DATA:  Seizure. EXAM: CT HEAD WITHOUT CONTRAST TECHNIQUE: Contiguous axial images were obtained from the base of the skull through the  vertex without intravenous contrast. RADIATION DOSE REDUCTION: This exam was performed according to the departmental dose-optimization program which includes automated exposure control, adjustment of the mA and/or kV according to patient size and/or use of iterative reconstruction technique. COMPARISON:  March 14, 2022. FINDINGS: Brain: No evidence of acute infarction, hemorrhage, hydrocephalus, extra-axial collection or mass lesion/mass effect. Vascular: No hyperdense vessel or unexpected calcification. Skull: Normal. Negative for fracture or focal lesion. Sinuses/Orbits: No acute finding. Other: None. IMPRESSION: No acute intracranial abnormality seen. Electronically Signed   By: March 16, 2022 M.D.   On: 04/11/2022 08:57   DG Chest Port 1 View  Result Date: 04/11/2022 CLINICAL DATA:  Recent seizure activity EXAM: PORTABLE CHEST 1 VIEW COMPARISON:  03/14/2022 FINDINGS: Cardiac shadow is enlarged but stable. The lungs are well aerated bilaterally. Mild right basilar atelectasis is seen. No acute bony abnormality is noted. IMPRESSION: Mild right basilar atelectasis. Electronically Signed   By: 05/15/2022 M.D.   On: 04/11/2022 01:32     Discharge Instructions: Discharge Instructions     Ambulatory referral to Neurology   Complete by: As directed    An appointment is requested in approximately: 1 week   Call MD for:  difficulty breathing, headache or visual disturbances   Complete by: As directed    Call MD for:  extreme fatigue   Complete by: As directed    Call MD for:  persistant dizziness or light-headedness   Complete by: As directed    Call MD for:  persistant nausea and vomiting   Complete by: As directed    Call MD for:  severe uncontrolled pain   Complete by: As directed    Call MD for:  temperature >100.4   Complete by: As directed    Diet - low sodium heart healthy   Complete by: As directed    Increase activity slowly   Complete by: As directed        Signed: Rocky MorelGoodwin,  Cassadee Vanzandt, DO 04/13/2022, 2:16 PM   Pager: [phone]

## 2022-04-13 NOTE — Progress Notes (Signed)
In regards to pt's currently ordered MRI, 3rd attempt made for scan per department notes. Pt premedicated for exam by RN then further medicated mid-way through exam by RN due to motion, pt began grabbing at head coil required for imaging, hitting the inside of the scanner, and ripping off monitoring. Scan aborted at that point for pt safety. Able to obtain limited study but, unable to reach contrasted potion of exam. Will change order to WO to reflect inability to reach contrast and will complete exam as limited study. Able to obtain AX DWI, COR DWI, SAG T1, AX T2, AX T2 FLAIR, AX SWI, and AX T1. Best possible images obtained.

## 2022-04-13 NOTE — TOC Transition Note (Signed)
Transition of Care [facility]) - CM/SW Discharge Note   Patient Details  Name: Theresa Rios MRN: [id number] Date of Birth: 11/15/1965  [facility]) CM/SW Contact:  Lawerance Sabal, RN Phone Number: 04/13/2022, 2:16 PM   Clinical Narrative:     MATCH letter provided to cover Rx for weekend DC.     Barriers to Discharge: Continued Medical Work up   Patient Goals and CMS Choice        Discharge Placement                       Discharge Plan and Services   Discharge Planning Services: CM Consult                                 Social Determinants of Health (SDOH) Interventions     Readmission Risk Interventions     No data to display

## 2022-04-13 NOTE — Progress Notes (Signed)
HD#2 Subjective:   Summary: Patient sleeping in bed when it is here and she woke up easily to her name.  She states that she was hungry but otherwise denies any issues overnight and expressed her desire to go home soon.  Overnight Events: CIWA was consistently 0 and she had no acute events overnight.     Objective:  Vital signs in last 24 hours: Vitals:   04/12/22 1957 04/12/22 2310 04/13/22 0401 04/13/22 0726  BP: 112/61 127/74 122/84 133/86  Pulse: 87 71 83 69  Resp: 18 20 20 18   Temp: 98.8 F (37.1 C) 98.5 F (36.9 C) 97.7 F (36.5 C) 98.4 F (36.9 C)  TempSrc: Oral Oral Axillary Oral  SpO2: 98% 100% 100% 96%  Weight:      Height:       Supplemental O2: Room Air SpO2: 96 % O2 Flow Rate (L/min): 2 L/min   Physical Exam:  Constitutional: well-appearing female sitting in bed, in no acute distress HENT: normocephalic atraumatic Neck: supple Pulmonary/Chest: normal work of breathing on room air Abdominal: soft, non-tender, non-distended MSK: normal bulk and tone Neurological: alert & oriented x 3 Skin: warm and dry   Filed Weights   04/11/22 0343 04/12/22 0500  Weight: 70 kg 69 kg     Intake/Output Summary (Last 24 hours) at 04/13/2022 0958 Last data filed at 04/13/2022 0020 Gross per 24 hour  Intake 534.11 ml  Output --  Net 534.11 ml   Net IO Since Admission: 2,783.49 mL [04/13/22 0958]  Pertinent Labs:    Latest Ref Rng & Units 04/13/2022    2:41 AM 04/12/2022    5:58 AM 04/11/2022   12:30 AM  CBC  WBC 4.0 - 10.5 K/uL 8.9  7.2  14.2   Hemoglobin 12.0 - 15.0 g/dL 06/11/2022  93.9  03.0   Hematocrit 36.0 - 46.0 % 39.2  39.6  43.6   Platelets 150 - 400 K/uL 200  204  253        Latest Ref Rng & Units 04/13/2022    2:41 AM 04/12/2022    5:58 AM 04/11/2022   12:30 AM  CMP  Glucose 70 - 99 mg/dL 06/11/2022  79  330   BUN 6 - 20 mg/dL 13  8  11    Creatinine 0.44 - 1.00 mg/dL 076   2.26   Sodium 135 - 145 mmol/L 137  139  139   Potassium 3.5 - 5.1 mmol/L 3.6   3.1  3.9   Chloride 98 - 111 mmol/L 104  104  105   CO2 22 - 32 mmol/L 22  22  15    Calcium 8.9 - 10.3 mg/dL 9.4  9.5  9.2   Total Protein 6.5 - 8.1 g/dL   7.4   Total Bilirubin 0.3 - 1.2 mg/dL   0.7   Alkaline Phos 38 - 126 U/L   68   AST 15 - 41 U/L   38   ALT 0 - 44 U/L   24     Imaging: MR BRAIN WO CONTRAST  Result Date: 04/13/2022 CLINICAL DATA:  Seizure disorder EXAM: MRI HEAD WITHOUT CONTRAST TECHNIQUE: Multiplanar, multiecho pulse sequences of the brain and surrounding structures were obtained without intravenous contrast. COMPARISON:  04/11/2022 FINDINGS: Brain: No acute infarct, mass effect or extra-axial collection. No acute or chronic hemorrhage. Normal white matter signal, parenchymal volume and CSF spaces. The midline structures are normal. Vascular: Major flow voids are preserved. Skull and upper cervical  spine: Normal calvarium and skull base. Visualized upper cervical spine and soft tissues are normal. Sinuses/Orbits:No paranasal sinus fluid levels or advanced mucosal thickening. No mastoid or middle ear effusion. Normal orbits. IMPRESSION: Normal brain MRI. Electronically Signed   By: Deatra Robinson M.D.   On: 04/13/2022 01:56    Assessment/Plan:   Principal Problem:   Seizure (HCC) Active Problems:   MDD (major depressive episode), single episode, severe, no psychosis (HCC)   Alcohol use disorder   Patient Summary: Theresa Rios is a 56 y.o. with a pertinent PMH of alcohol use disorder, major depressive disorder, and seizures who presented with recurrent seizures and admitted for evaluation and management suspected alcohol withdrawal seizures.    Alcohol withdrawal seizure Most likely due to alcohol withdrawal possible other substance abuse (positive UDS for cocaine and benzodiazepines).  EEG and brain MRI were unremarkable. -Appreciate neurology recommendation, okay to DC from their standpoint with scription for Keppra -Continue Keppra 1 g BID -CIWA with Ativan  protocol and added Librium taper which will finish on Monday, August 7 -Thiamine and folic acid   Anion gap metabolic acidosis-resolved Likely starvation ketosis at this resolved with fluids and p.o. intake.  No signs of lactic acidosis. -Continue to monitor with daily BMP   AKI-resolved Baseline Cr 0.5.  Creatinine down from 1.01-0.73 Likely pre-renal. S/p 2L bolus NS and maintenance 125 mL/h -DC IVF -Continue to monitor with daily BMP   Major depressive disorder She is following [facility] last seen on 7/26.  No suicidal -Continue home Zoloft   UTI UA showed leukocytes, bacteria, white blood cells, and trichomonas was noted on microscopic evaluation. - Metronidazole 500 mg twice daily for 7 days (stop date August 11)       Diet: Normal IVF: None VTE: Enoxaparin Code: Full TOC recs: Expected discharge home with self-care     Dispo: Anticipated discharge to Home pending discussion about alcohol cessation and Librium taper.  Rocky Morel DO Internal Medicine Resident PGY-1 Pager: [phone]  Please contact the on call pager after 5 pm and on weekends at [phone].

## 2022-04-25 ENCOUNTER — Encounter: Payer: Self-pay | Admitting: Neurology

## 2022-04-25 ENCOUNTER — Inpatient Hospital Stay: Payer: Self-pay | Admitting: Neurology

## 2022-04-26 ENCOUNTER — Encounter (HOSPITAL_COMMUNITY): Payer: Self-pay | Admitting: Psychiatry

## 2022-04-26 ENCOUNTER — Ambulatory Visit (HOSPITAL_COMMUNITY): Payer: Self-pay | Admitting: Mental Health

## 2022-05-05 ENCOUNTER — Encounter (HOSPITAL_COMMUNITY): Payer: Self-pay | Admitting: Psychiatry

## 2022-05-05 ENCOUNTER — Other Ambulatory Visit: Payer: Self-pay

## 2022-05-05 ENCOUNTER — Emergency Department (HOSPITAL_COMMUNITY): Payer: Self-pay

## 2022-05-05 ENCOUNTER — Ambulatory Visit (HOSPITAL_COMMUNITY)
Admission: AD | Admit: 2022-05-05 | Discharge: 2022-05-05 | Disposition: A | Discharge status: Home / Self Care | Payer: No Payment, Other | Attending: Emergency Medicine | Admitting: Emergency Medicine

## 2022-05-05 DIAGNOSIS — F1010 Alcohol abuse, uncomplicated: Secondary | ICD-10-CM | POA: Insufficient documentation

## 2022-05-05 DIAGNOSIS — Z20822 Contact with and (suspected) exposure to covid-19: Secondary | ICD-10-CM | POA: Insufficient documentation

## 2022-05-05 DIAGNOSIS — F1090 Alcohol use, unspecified, uncomplicated: Secondary | ICD-10-CM

## 2022-05-05 DIAGNOSIS — F411 Generalized anxiety disorder: Secondary | ICD-10-CM | POA: Insufficient documentation

## 2022-05-05 DIAGNOSIS — R569 Unspecified convulsions: Secondary | ICD-10-CM | POA: Insufficient documentation

## 2022-05-05 DIAGNOSIS — R443 Hallucinations, unspecified: Secondary | ICD-10-CM | POA: Insufficient documentation

## 2022-05-05 LAB — CBC
HCT: 37.9 % (ref 36.0–46.0)
Hemoglobin: 12.3 g/dL (ref 12.0–15.0)
MCH: 28.5 pg (ref 26.0–34.0)
MCHC: 32.5 g/dL (ref 30.0–36.0)
MCV: 87.7 fL (ref 80.0–100.0)
Platelets: 187 10*3/uL (ref 150–400)
RBC: 4.32 MIL/uL (ref 3.87–5.11)
RDW: 13.5 % (ref 11.5–15.5)
WBC: 11.2 10*3/uL — ABNORMAL HIGH (ref 4.0–10.5)
nRBC: 0 % (ref 0.0–0.2)

## 2022-05-05 LAB — HEPATIC FUNCTION PANEL
ALT: 30 U/L (ref 0–44)
AST: 19 U/L (ref 15–41)
Albumin: 3.6 g/dL (ref 3.5–5.0)
Alkaline Phosphatase: 69 U/L (ref 38–126)
Bilirubin, Direct: 0.2 mg/dL (ref 0.0–0.2)
Indirect Bilirubin: 0.9 mg/dL (ref 0.3–0.9)
Total Bilirubin: 1.1 mg/dL (ref 0.3–1.2)
Total Protein: 6.9 g/dL (ref 6.5–8.1)

## 2022-05-05 LAB — TROPONIN I (HIGH SENSITIVITY): Troponin I (High Sensitivity): 4 ng/L (ref <18)

## 2022-05-05 LAB — URINALYSIS, ROUTINE W REFLEX MICROSCOPIC
Bilirubin Urine: NEGATIVE
Glucose, UA: NEGATIVE mg/dL
Hgb urine dipstick: NEGATIVE
Ketones, ur: 5 mg/dL — AB
Leukocytes,Ua: NEGATIVE
Nitrite: NEGATIVE
Protein, ur: NEGATIVE mg/dL
Specific Gravity, Urine: 1.014 (ref 1.005–1.030)
pH: 6 (ref 5.0–8.0)

## 2022-05-05 LAB — ETHANOL: Alcohol, Ethyl (B): <10 mg/dL (ref <10)

## 2022-05-05 LAB — I-STAT BETA HCG BLOOD, ED (MC, WL, AP ONLY): I-stat hCG, quantitative: <5 m[IU]/mL (ref <5)

## 2022-05-05 LAB — BASIC METABOLIC PANEL
Anion gap: 10 (ref 5–15)
BUN: 6 mg/dL (ref 6–20)
CO2: 22 mmol/L (ref 22–32)
Calcium: 9.2 mg/dL (ref 8.9–10.3)
Chloride: 108 mmol/L (ref 98–111)
Creatinine, Ser: 0.62 mg/dL (ref 0.44–1.00)
GFR, Estimated: >60 mL/min (ref >60)
Glucose, Bld: 104 mg/dL — ABNORMAL HIGH (ref 70–99)
Potassium: 3.1 mmol/L — ABNORMAL LOW (ref 3.5–5.1)
Sodium: 140 mmol/L (ref 135–145)

## 2022-05-05 LAB — RAPID URINE DRUG SCREEN, HOSP PERFORMED
Amphetamines: NOT DETECTED
Barbiturates: NOT DETECTED
Benzodiazepines: POSITIVE — AB
Cocaine: POSITIVE — AB
Opiates: NOT DETECTED
Tetrahydrocannabinol: NOT DETECTED

## 2022-05-05 LAB — SARS CORONAVIRUS 2 BY RT PCR: SARS Coronavirus 2 by RT PCR: NEGATIVE

## 2022-05-05 LAB — SALICYLATE LEVEL: Salicylate Lvl: <7 mg/dL — ABNORMAL LOW (ref 7.0–30.0)

## 2022-05-05 LAB — CBG MONITORING, ED: Glucose-Capillary: 103 mg/dL — ABNORMAL HIGH (ref 70–99)

## 2022-05-05 LAB — ACETAMINOPHEN LEVEL: Acetaminophen (Tylenol), Serum: <10 ug/mL — ABNORMAL LOW (ref 10–30)

## 2022-05-05 MED ORDER — POTASSIUM CHLORIDE CRYS ER 20 MEQ PO TBCR: 40.0000 meq | EXTENDED_RELEASE_TABLET | Freq: Two times a day (BID) | ORAL | Status: DC

## 2022-05-05 MED ORDER — CHLORDIAZEPOXIDE HCL 25 MG PO CAPS: ORAL_CAPSULE | ORAL | 0 refills | Status: DC

## 2022-05-05 MED ORDER — LEVETIRACETAM IN NACL 1000 MG/100ML IV SOLN
1000.0000 mg | Freq: Once | INTRAVENOUS | Status: AC
  Administered 2022-05-05: 1000 mg via INTRAVENOUS
  Filled 2022-05-05: qty 100

## 2022-05-05 MED ORDER — POTASSIUM CHLORIDE CRYS ER 20 MEQ PO TBCR
40.0000 meq | EXTENDED_RELEASE_TABLET | Freq: Once | ORAL | Status: AC
Start: 2022-05-05 — End: 2022-05-05
  Administered 2022-05-05: 40 meq via ORAL
  Filled 2022-05-05: qty 2

## 2022-05-05 NOTE — H&P (Cosign Needed Addendum)
Behavioral Health Medical Screening Exam  Theresa Rios is an 56 y.o. female with documented past history of anxiety, MDD, alcohol use disorder, substance use disorder who presented to [facility] voluntarily with her significant other who provided majority of information for assessment.   AC Kelly notified NP of concern for patient's presentation. Patient presents slouched in chair wearing pajamas; increased work of breathing, diminished cognition, weak, SOB when talking. Denies any alcohol or substance ingestion. Reports "not feeling good". She shakes head and speaks lowly; provided permission to speak to partner.    Collateral: Marlon (Significant other) Denies any substance or alcohol use. Reports pt suffered head trauma at work ([employer]) last year which he believes initiated onset of seizures. Denies any history of seizures otherwise. Reports pt does smoke cigarettes; denies any diagnoses of emphysema or COPD. States pt has been experiencing changes in cognition, mood, and memory. Reports significant seizure activity x3 Tuesday with jerking, foaming at the mouth, loss of consciousness; has been "hallucinating" since reported seizures. Reports pt was recently discharged from [facility] after "taking a bunch of pills" in an attempt to harm herself;denies any active suicidal ideations or self harming behaviors since discharge.   Total Time spent with patient: 30 minutes  Psychiatric Specialty Exam: Physical Exam Vitals and nursing note reviewed.  Constitutional:      Appearance: She is ill-appearing.  HENT:     Head: Normocephalic.     Nose: Nose normal.     Mouth/Throat:     Pharynx: Oropharynx is clear.  Eyes:     Pupils: Pupils are equal, round, and reactive to light.  Cardiovascular:     Rate and Rhythm: Tachycardia present.     Pulses: Normal pulses.  Pulmonary:     Effort: Accessory muscle usage and retractions present.     Comments: Increased work of breathing. Abdominal:     General: There is  distension.  Musculoskeletal:     Comments: Decreased strength  Skin:    General: Skin is dry.  Neurological:     Mental Status: She is disoriented.  Psychiatric:        Attention and Perception: She is inattentive.        Mood and Affect: Affect is inappropriate.        Speech: Speech is delayed and slurred.        Behavior: Behavior is withdrawn.        Thought Content: Thought content is not paranoid or delusional. Thought content does not include homicidal or suicidal ideation. Thought content does not include homicidal or suicidal plan.        Cognition and Memory: Cognition is impaired. Memory is impaired.        Judgment: Judgment is inappropriate.   Review of Systems  Constitutional:  Positive for activity change and appetite change.  Respiratory:  Positive for shortness of breath.   Psychiatric/Behavioral:  Positive for confusion.    Last menstrual period 05/17/2014.There is no height or weight on file to calculate BMI. General Appearance: Disheveled Eye Contact:  Poor Speech:  Slow Volume:  Decreased Mood:  NA Affect:  Inappropriate Thought Process:  NA Orientation:  Other:  person, place "hospital" Thought Content:  Illogical Suicidal Thoughts:  No Homicidal Thoughts:  No Memory:  Immediate;   Poor Recent;   Poor Judgement:  Impaired Insight:  Shallow Psychomotor Activity:  Decreased Concentration: Concentration: Fair and Attention Span: Fair Recall:  Poor Fund of Knowledge:Poor Language: Fair Akathisia:  NA Handed:  Right AIMS (if  indicated):    Assets:  Housing Resilience Social Support Vocational/Educational Sleep:     Musculoskeletal: Strength & Muscle Tone: decreased Gait & Station:  did not fully assess due to current state Patient leans: N/A  Last menstrual period 05/17/2014.  [place] Scale:  Flowsheet Row [facility]) [facility]19/2022 in [facility]GENC[facility]PART[facility] ED from 03/14/2022 in [facility]  C-SSRS RISK CATEGORY High Risk No Risk No Risk       Recommendations: Based on my evaluation the patient appears to have an emergency medical condition for which I recommend the patient be transferred to the emergency department for further evaluation. Patient transferred to [facility] via EMS for further observation and stabilization. Report called to EDP Wallace Cullens, MD [phone].   Loletta Parish, NP 05/05/2022, 8:55 AM

## 2022-05-05 NOTE — Discharge Summary (Signed)
[facility] Psych ED Discharge  05/05/2022 6:26 PM WYNN KERNES  MRN:  [id number]  Principal Problem: Alcohol use disorder Discharge Diagnoses: Principal Problem:   Alcohol use disorder  Clinical Impression:  Final diagnoses:  Alcohol use disorder  Anxiety state   Subjective: Theresa Rios is an 56 y.o. female with documented past history of anxiety, MDD, alcohol use disorder, substance use disorder who presented to [facility] voluntarily as a walk-in. She was transferred to [facility] for medical clearance.  Patient states that she became depressed last year after she hit her head at work. She states that she then started drinking alcohol and at some point began having seizures. Patient states that she drinks 4-5 (12 ounce) beers daily. Reports having "seizures," unclear if these are withdrawal related. Patient reports occasional use of cocaine. BAL<10. UDS + benzodiazepines and cocaine.   On evaluation patient is alert and oriented x 3, pleasant, and cooperative. Speech is clear and coherent. Mood is anxious/depressed and affect is congruent with mood. Thought process is coherent and thought content is logical. Denies auditory and visual hallucinations. No indication that patient is responding to internal stimuli. No evidence of delusional thought content. Denies suicidal ideations. Denies homicidal ideations.   Patient was inpatient at [facility] 07/13-07/19/23 after a suicide attempt by overdose. Patient was discharged on gabapentin 300 mg daily and 600 mg QHS for alcohol use disorder, hydroxyzine 25 mg TID prn for anxiety, sertraline 100 mg daily for depression, and trazodone 100 mg QHS prn for sleep. Patient reports that she has since followed up with Mee Hives, NP at [facility] [place] and has a follow up appointment scheduled for 05/15/2022.   ED Assessment Time Calculation: Start Time: 1755 Stop Time: 1820 Total Time in Minutes (Assessment Completion): 25   Past Psychiatric History: see  above  Past Medical History:  Past Medical History:  Diagnosis Date   Seizures (HCC)    History reviewed. No pertinent surgical history. Family History: No family history on file. Social History:  Social History   Substance and Sexual Activity  Alcohol Use Yes   Comment: 4-5 beers a day most days     Social History   Substance and Sexual Activity  Drug Use Yes   Types: Cocaine, Marijuana   Comment: last marijuana use: 2 mos; last cocaine use: 3 days ago    Social History   Socioeconomic History   Marital status: Media planner    Spouse name: Not on file   Number of children: 0   Years of education: Not on file   Highest education level: Not on file  Occupational History   Not on file  Tobacco Use   Smoking status: Every Day    Packs/day: 0.50    Types: Cigarettes   Smokeless tobacco: Never  Vaping Use   Vaping Use: Never used  Substance and Sexual Activity   Alcohol use: Yes    Comment: 4-5 beers a day most days   Drug use: Yes    Types: Cocaine, Marijuana    Comment: last marijuana use: 2 mos; last cocaine use: 3 days ago   Sexual activity: Not on file  Other Topics Concern   Not on file  Social History Narrative   Pt lives in [place] with a friend. Her husband is living with his mother. Pt recently lost her job and her place to stay.   Social Determinants of Health   Financial Resource Strain: Not on file  Food Insecurity: Not  on file  Transportation Needs: Not on file  Physical Activity: Not on file  Stress: Not on file  Social Connections: Not on file    Tobacco Cessation:  A prescription for an FDA-approved tobacco cessation medication was offered at discharge and the patient refused  Current Medications: No current facility-administered medications for this encounter.   Current Outpatient Medications  Medication Sig Dispense Refill   cyclobenzaprine (FLEXERIL) 10 MG tablet Take 10 mg by mouth 3 (three) times daily as needed for muscle  spasms.     gabapentin (NEURONTIN) 300 MG capsule Take 2 capsules (600 mg total) by mouth at bedtime. (Patient not taking: Reported on 04/12/2022) 60 capsule 0   levETIRAcetam (KEPPRA) 1000 MG tablet Take 1 tablet (1,000 mg total) by mouth 2 (two) times daily. 60 tablet 3   lidocaine (LIDODERM) 5 % Place 1 patch onto the skin daily. Remove & Discard patch within 12 hours or as directed by MD 30 patch 0   metroNIDAZOLE (FLAGYL) 500 MG tablet Take 1 tablet (500 mg total) by mouth 2 (two) times daily. Please take this medication as prescribed for 5 more days and have your husband take it also as prescribed for 7 days. 24 tablet 0   nicotine (NICODERM CQ - DOSED IN MG/24 HOURS) 14 mg/24hr patch Place 1 patch (14 mg total) onto the skin daily. (Patient not taking: Reported on 04/12/2022) 28 patch 1   sertraline (ZOLOFT) 100 MG tablet Take 1 tablet (100 mg total) by mouth daily. (Patient not taking: Reported on 04/12/2022) 30 tablet 1   traZODone (DESYREL) 100 MG tablet Take 1 tablet (100 mg total) by mouth at bedtime as needed for sleep. (Patient not taking: Reported on 04/12/2022) 30 tablet 0   PTA Medications: (Not in a hospital admission)   G[place] Scale:  Flowsheet Row ED from 05/05/2022 in [facility] Admission (Discharged) [facility] from 03/19/2022 in [facility] EMERGENCY DEPARTMENT  C-SSRS RISK CATEGORY No Risk High Risk No Risk       Musculoskeletal: Strength & Muscle Tone: within normal limits   Psychiatric Specialty Exam: Presentation  General Appearance: Appropriate for Environment; Fairly Groomed  Eye Contact:Good  Speech:Clear and Coherent; Normal Rate  Speech Volume:Normal  Handedness:Right   Mood and Affect  Mood:Anxious; Depressed  Affect:Congruent   Thought Process  Thought Processes:Coherent; Goal Directed; Linear  Descriptions of  Associations:Intact  Orientation:Full (Time, Place and Person)  Thought Content:Logical  History of Schizophrenia/Schizoaffective disorder:No data recorded Duration of Psychotic Symptoms:No data recorded Hallucinations:Hallucinations: None  Ideas of Reference:None  Suicidal Thoughts:Suicidal Thoughts: No  Homicidal Thoughts:Homicidal Thoughts: No   Sensorium  Memory:Immediate Good; Recent Good  Judgment:Fair  Insight:Fair   Executive Functions  Concentration:Fair  Attention Span:Fair  Recall:Good  Fund of Knowledge:Good  Language:Good   Psychomotor Activity  Psychomotor Activity:Psychomotor Activity: Normal   Assets  Assets:Communication Skills; Desire for Improvement   Sleep  Sleep:Sleep: Fair    Physical Exam: Physical Exam Constitutional:      General: She is not in acute distress.    Appearance: She is not ill-appearing, toxic-appearing or diaphoretic.  HENT:     Right Ear: External ear normal.     Left Ear: External ear normal.  Eyes:     General:        Right eye: No discharge.        Left eye: No discharge.     Pupils: Pupils are equal,  round, and reactive to light.  Cardiovascular:     Rate and Rhythm: Normal rate.  Pulmonary:     Effort: Pulmonary effort is normal. No respiratory distress.  Musculoskeletal:        General: Normal range of motion.     Cervical back: Normal range of motion.  Neurological:     Mental Status: She is alert and oriented to person, place, and time.  Psychiatric:        Mood and Affect: Mood is anxious and depressed.        Behavior: Behavior is cooperative.        Thought Content: Thought content is not paranoid. Thought content does not include homicidal or suicidal ideation.    Review of Systems  Respiratory:  Negative for cough and shortness of breath.   Cardiovascular:  Negative for chest pain.  Gastrointestinal:  Negative for diarrhea, nausea and vomiting.  Psychiatric/Behavioral:  Positive for  depression and substance abuse. Negative for hallucinations, memory loss and suicidal ideas. The patient is nervous/anxious and has insomnia.    Blood pressure (!) 126/107, pulse 83, temperature 98.4 F (36.9 C), temperature source Oral, resp. rate (!) 26, height 5\' 2"  (1.575 m), weight 71.7 kg, last menstrual period 05/17/2014, SpO2 100 %. Body mass index is 28.9 kg/m.   Demographic Factors:  Low socioeconomic status and Unemployed  Loss Factors: Financial problems/change in socioeconomic status  Historical Factors: Prior suicide attempts and Family history of mental illness or substance abuse  Risk Reduction Factors:   Sense of responsibility to family, Religious beliefs about death, Living with another person, especially a relative, and Positive social support  Continued Clinical Symptoms:  Alcohol/Substance Abuse/Dependencies Previous Psychiatric Diagnoses and Treatments Medical Diagnoses and Treatments/Surgeries  Cognitive Features That Contribute To Risk:  Closed-mindedness    Suicide Risk:  Mild:  Suicidal ideation of limited frequency, intensity, duration, and specificity.  There are no identifiable plans, no associated intent, mild dysphoria and related symptoms, good self-control (both objective and subjective assessment), few other risk factors, and identifiable protective factors, including available and accessible social support.   Follow-up Information     Services, [facility]. Go to.   Why: Substance abuse treatment programs Contact information: [address] [place] [phone]         Placey, [phone], NP. Go on 05/15/2022.   Why: Keep Appointment with 07/15/2022, NP Contact information: [address] [place] [phone]                Medical Decision Making: At time of discharge, patient denies SI, HI, AVH and is able to contract for safety. She demonstrated no overt evidence of psychosis or  mania. Prior to discharge the verbalized that she understood warning signs, triggers, and symptoms of worsening mental health and how to access emergency mental health care if they felt it was needed. Patient was instructed to call 911 or return to the emergency room if they experienced any concerning symptoms after discharge. Patient voiced understanding and agreed to this.  Problem 1: Alcohol Use Disorder  Problem 2: Cocaine Use Disorder  Disposition: No evidence of imminent risk to self or others at present.   Patient does not meet criteria for psychiatric inpatient admission. Discussed crisis plan, support from social network, calling 911, coming to the Emergency Department, and calling Suicide Hotline.     Discharge Instructions        Discharge recommendations:  Patient is to take  medications as prescribed. Please see information for follow-up appointment with psychiatry and therapy. Please follow up with your primary care provider for all medical related needs.   Therapy: We recommend that patient participate in individual therapy to address mental health concerns.  Medications: The patient  is to contact a medical professional and/or outpatient provider to address any new side effects that develop. Patient should update outpatient providers of any new medications and/or medication changes.   Safety:  The patient should abstain from use of illicit substances/drugs and abuse of any medications. If symptoms worsen or do not continue to improve or if the patient becomes actively suicidal or homicidal then it is recommended that the patient return to the closest hospital emergency department, [facility], or call 911 for further evaluation and treatment. National Suicide Prevention Lifeline 1-800-SUICIDE or 601-417-91361-469 353 6740.  About 988 988 offers 24/7 access to trained crisis counselors who can help people experiencing mental health-related  distress. People can call or text 988 or chat 988lifeline.org for themselves or if they are worried about a loved one who may need crisis support.  Crisis Mobile: [facility]:                     (249)662-18981-905-260-9751 (for crisis response 24 hours a day) [facility] Hotline:                                            320-421-34491-781-067-3563   Please refrain from using alcohol or illicit substances, as they can affect your mood and can cause depression, anxiety or other concerning symptoms.  Alcohol can increase the chance that a person will make reckless decisions, like attempting suicide, and can increase the lethality of a drug overdose.   ___________________________________________ Substance Abuse Treatment Programs   Intensive Outpatient Programs [facility]                                   [address]                                                        [place], [place]                                                                         [phone]                                                     [facility] [address] [place], [place] [phone]   [facility]                             (  Inpatient and outpatient)                                             [address]                                                                                                                [phone]                 [facility] [phone] (Suboxone and Methadone)   [address]                                                           [place], [place] [address]                                       [phone]                                                     [address] [place], [place] [phone]   [facility] (Outpatient/Inpatient, [facility])                    (insurance only) [phone]                                                                                                                                     [facility] (Groups & Residential) [place], [place] [phone]                            [facility]                                        [address]  [place], [place]                                                           [phone]                                                     [facility] (for caregivers and family) [phone] [place], [place] [phone] etc.          Jackelyn Poling, NP 05/05/2022, 6:26 PM

## 2022-05-05 NOTE — ED Provider Notes (Signed)
[facility]-EMERGENC[facility] DEPT Provider Note   CSN: [id number] Arrival date & time: 05/05/22  2202     History {Add pertinent medical, surgical, social history, OB history to HPI:1} No chief complaint on file.   Theresa Rios is a 56 y.o. female.  HPI      Was admitted with concern for possible alcohol withdrawal seizure Went to [facility] today with concern for depression  Had a seizure last Tuesday NO seizures since then Drinking etoh, last drink was yesterday , 4 cans per day, no other drug use, is smoking cigarettes  Feeling a little better now compared to this morning. Was hallucinating this AM, reaching out for stuff that wasn't there.  Has had hallucinations before, thinks it was from the head injury she suffered.  Was feeling dizzy before, lightheaded, not room spinning No chest pain or dyspnea No fevers, cough, urinary symptoms Denies numbness, weakness, difficulty talking or walking, visual changes or facial droop.    Is alert, oriented to place, date, self   Home Medications Prior to Admission medications   Medication Sig Start Date End Date Taking? Authorizing Provider  cyclobenzaprine (FLEXERIL) 10 MG tablet Take 10 mg by mouth 3 (three) times daily as needed for muscle spasms.    [provider]  gabapentin (NEURONTIN) 300 MG capsule Take 2 capsules (600 mg total) by mouth at bedtime. Patient not taking: Reported on 04/12/2022 03/27/22 04/26/22  Phineas Inches, MD  levETIRAcetam (KEPPRA) 1000 MG tablet Take 1 tablet (1,000 mg total) by mouth 2 (two) times daily. 04/13/22   Rocky Morel, DO  lidocaine (LIDODERM) 5 % Place 1 patch onto the skin daily. Remove & Discard patch within 12 hours or as directed by MD 04/13/22   Rocky Morel, DO  metroNIDAZOLE (FLAGYL) 500 MG tablet Take 1 tablet (500 mg total) by mouth 2 (two) times daily. Please take this medication as prescribed for 5 more days and have your husband take it also as prescribed  for 7 days. 04/13/22   Rocky Morel, DO  nicotine (NICODERM CQ - DOSED IN MG/24 HOURS) 14 mg/24hr patch Place 1 patch (14 mg total) onto the skin daily. Patient not taking: Reported on 04/12/2022 04/03/22   Karsten Ro, MD  sertraline (ZOLOFT) 100 MG tablet Take 1 tablet (100 mg total) by mouth daily. Patient not taking: Reported on 04/12/2022 04/03/22   Karsten Ro, MD  traZODone (DESYREL) 100 MG tablet Take 1 tablet (100 mg total) by mouth at bedtime as needed for sleep. Patient not taking: Reported on 04/12/2022 03/27/22 04/26/22  Phineas Inches, MD      Allergies    Patient has no known allergies.    Review of Systems   Review of Systems  Physical Exam Updated Vital Signs BP 125/71   Pulse 88   Temp 98.6 F (37 C) (Oral)   Resp (!) 27   LMP 05/17/2014   SpO2 93%  Physical Exam  ED Results / Procedures / Treatments   Labs (all labs ordered are listed, but only abnormal results are displayed) Labs Reviewed  BASIC METABOLIC PANEL - Abnormal; Notable for the following components:      Result Value   Potassium 3.1 (*)    Glucose, Bld 104 (*)    All other components within normal limits  CBC - Abnormal; Notable for the following components:   WBC 11.2 (*)    All other components within normal limits  CBG MONITORING, ED - Abnormal; Notable for the following components:  Glucose-Capillary 103 (*)    All other components within normal limits  SARS CORONAVIRUS 2 BY RT PCR  URINALYSIS, ROUTINE W REFLEX MICROSCOPIC  I-STAT BETA HCG BLOOD, ED ([facility], [facility], [facility] ONLY)    EKG EKG Interpretation  Date/Time:  Sunday May 05 2022 10:53:56 EDT Ventricular Rate:  93 PR Interval:  165 QRS Duration: 97 QT Interval:  389 QTC Calculation: 484 R Axis:   -20 Text Interpretation: Sinus rhythm Probable left atrial enlargement LVH with secondary repolarization abnormality Baseline wander in lead(s) V4 ST abnormality new in comparison to most recent abx Confirmed by Alvira Monday ([id number])  on 05/05/2022 2:47:49 PM  Radiology No results found.  Procedures Procedures  {Document cardiac monitor, telemetry assessment procedure when appropriate:1}  Medications Ordered in ED Medications  potassium chloride SA (KLOR-CON M) CR tablet 40 mEq (has no administration in time range)    ED Course/ Medical Decision Making/ A&P                           Medical Decision Making Risk Prescription drug management.   ***  {Document critical care time when appropriate:1} {Document review of labs and clinical decision tools ie heart score, Chads2Vasc2 etc:1}  {Document your independent review of radiology images, and any outside records:1} {Document your discussion with family members, caretakers, and with consultants:1} {Document social determinants of health affecting pt's care:1} {Document your decision making why or why not admission, treatments were needed:1} Final Clinical Impression(s) / ED Diagnoses Final diagnoses:  None    Rx / DC Orders ED Discharge Orders     None

## 2022-05-05 NOTE — Discharge Instructions (Addendum)
Discharge recommendations:  Patient is to take medications as prescribed. Please see information for follow-up appointment with psychiatry and therapy. Please follow up with your primary care provider for all medical related needs.   Therapy: We recommend that patient participate in individual therapy to address mental health concerns.  Medications: The patient  is to contact a medical professional and/or outpatient provider to address any new side effects that develop. Patient should update outpatient providers of any new medications and/or medication changes.   Safety:  The patient should abstain from use of illicit substances/drugs and abuse of any medications. If symptoms worsen or do not continue to improve or if the patient becomes actively suicidal or homicidal then it is recommended that the patient return to the closest hospital emergency department, the [facility], or call 911 for further evaluation and treatment. National Suicide Prevention Lifeline 1-800-SUICIDE or 7241897263.  About 988 988 offers 24/7 access to trained crisis counselors who can help people experiencing mental health-related distress. People can call or text 988 or chat 988lifeline.org for themselves or if they are worried about a loved one who may need crisis support.  Crisis Mobile: [facility]:                     [phone] (for crisis response 24 hours a day) [facility]:                                            [phone]   Please refrain from using alcohol or illicit substances, as they can affect your mood and can cause depression, anxiety or other concerning symptoms.  Alcohol can increase the chance that a person will make reckless decisions, like attempting suicide, and can increase the lethality of a drug overdose.   ___________________________________________ Substance Abuse Treatment Programs   Intensive Outpatient  Programs [facility]                                   [address]                                                        [place], [place]                                                                         [phone]                                                     [facility] [address] [place] [address] [place], [place] [phone]   [facility]                             (  Inpatient and outpatient)                                             [address]                                                                                                                [place] [phone] (Suboxone and Methadone)   [place], [place] [address]                                       [phone]                                                     [address] [place], [place]   [facility] ([place], [place])                    (insurance only) [phone]                                                                                                                                    [facility] ([place]) [place], [place]                            [facility]                                        [place]  [place], [place]                                                           [phone]                                                     [facility] (for caregivers and family) [phone] [address] [place], [place] [phone] etc.

## 2022-05-05 NOTE — Progress Notes (Signed)
Pt presents as walk in to [facility]. Pt leaning back in chair in her pajamas. Person with her says pt is here because she is having seizures. Pt states she does not feel well. NP notified and assessed pt in the lobby. NP decision to send to [facility] ED for medical clearance given complaints and presentation. EMS called.  Rosey Bath

## 2022-05-05 NOTE — ED Triage Notes (Signed)
Patient coming from behavior health with c/o dizziness and weakness. Dizziness since this morning and weakness is been about a week.

## 2022-05-06 ENCOUNTER — Inpatient Hospital Stay (HOSPITAL_COMMUNITY)
Admission: AD | Admit: 2022-05-06 | Discharge: 2022-05-10 | DRG: 885 | Disposition: A | Discharge status: Home / Self Care | Payer: Federal, State, Local not specified - Other | Attending: Psychiatry | Admitting: Psychiatry

## 2022-05-06 ENCOUNTER — Other Ambulatory Visit: Payer: Self-pay

## 2022-05-06 ENCOUNTER — Emergency Department (HOSPITAL_COMMUNITY): Payer: Self-pay

## 2022-05-06 ENCOUNTER — Emergency Department (HOSPITAL_COMMUNITY)
Admission: EM | Admit: 2022-05-06 | Discharge: 2022-05-06 | Disposition: A | Discharge status: Home / Self Care | Payer: Self-pay | Attending: Emergency Medicine | Admitting: Emergency Medicine

## 2022-05-06 ENCOUNTER — Encounter (HOSPITAL_COMMUNITY): Payer: Self-pay

## 2022-05-06 ENCOUNTER — Encounter (HOSPITAL_COMMUNITY): Payer: Self-pay | Admitting: Psychiatry

## 2022-05-06 DIAGNOSIS — F411 Generalized anxiety disorder: Secondary | ICD-10-CM | POA: Diagnosis present

## 2022-05-06 DIAGNOSIS — N390 Urinary tract infection, site not specified: Secondary | ICD-10-CM | POA: Diagnosis present

## 2022-05-06 DIAGNOSIS — K30 Functional dyspepsia: Secondary | ICD-10-CM | POA: Diagnosis present

## 2022-05-06 DIAGNOSIS — F1090 Alcohol use, unspecified, uncomplicated: Secondary | ICD-10-CM | POA: Diagnosis present

## 2022-05-06 DIAGNOSIS — F1020 Alcohol dependence, uncomplicated: Secondary | ICD-10-CM | POA: Diagnosis present

## 2022-05-06 DIAGNOSIS — Z87820 Personal history of traumatic brain injury: Secondary | ICD-10-CM

## 2022-05-06 DIAGNOSIS — Z79899 Other long term (current) drug therapy: Secondary | ICD-10-CM

## 2022-05-06 DIAGNOSIS — F1490 Cocaine use, unspecified, uncomplicated: Secondary | ICD-10-CM | POA: Diagnosis present

## 2022-05-06 DIAGNOSIS — Z5901 Sheltered homelessness: Secondary | ICD-10-CM | POA: Diagnosis not present

## 2022-05-06 DIAGNOSIS — Z9151 Personal history of suicidal behavior: Secondary | ICD-10-CM

## 2022-05-06 DIAGNOSIS — Z79891 Long term (current) use of opiate analgesic: Secondary | ICD-10-CM | POA: Diagnosis not present

## 2022-05-06 DIAGNOSIS — F332 Major depressive disorder, recurrent severe without psychotic features: Secondary | ICD-10-CM | POA: Diagnosis present

## 2022-05-06 DIAGNOSIS — G4700 Insomnia, unspecified: Secondary | ICD-10-CM | POA: Diagnosis present

## 2022-05-06 DIAGNOSIS — F324 Major depressive disorder, single episode, in partial remission: Secondary | ICD-10-CM

## 2022-05-06 DIAGNOSIS — F331 Major depressive disorder, recurrent, moderate: Secondary | ICD-10-CM | POA: Diagnosis not present

## 2022-05-06 DIAGNOSIS — Z7141 Alcohol abuse counseling and surveillance of alcoholic: Secondary | ICD-10-CM | POA: Diagnosis not present

## 2022-05-06 DIAGNOSIS — Z7151 Drug abuse counseling and surveillance of drug abuser: Secondary | ICD-10-CM

## 2022-05-06 DIAGNOSIS — R569 Unspecified convulsions: Secondary | ICD-10-CM

## 2022-05-06 DIAGNOSIS — F322 Major depressive disorder, single episode, severe without psychotic features: Principal | ICD-10-CM

## 2022-05-06 DIAGNOSIS — K4090 Unilateral inguinal hernia, without obstruction or gangrene, not specified as recurrent: Secondary | ICD-10-CM | POA: Insufficient documentation

## 2022-05-06 LAB — CBC
HCT: 38.1 % (ref 36.0–46.0)
Hemoglobin: 12.4 g/dL (ref 12.0–15.0)
MCH: 28.6 pg (ref 26.0–34.0)
MCHC: 32.5 g/dL (ref 30.0–36.0)
MCV: 88 fL (ref 80.0–100.0)
Platelets: 189 10*3/uL (ref 150–400)
RBC: 4.33 MIL/uL (ref 3.87–5.11)
RDW: 13.5 % (ref 11.5–15.5)
WBC: 9.4 10*3/uL (ref 4.0–10.5)
nRBC: 0 % (ref 0.0–0.2)

## 2022-05-06 LAB — URINALYSIS, ROUTINE W REFLEX MICROSCOPIC
Bilirubin Urine: NEGATIVE
Glucose, UA: NEGATIVE mg/dL
Hgb urine dipstick: NEGATIVE
Ketones, ur: NEGATIVE mg/dL
Nitrite: POSITIVE — AB
Protein, ur: NEGATIVE mg/dL
Specific Gravity, Urine: 1.014 (ref 1.005–1.030)
pH: 6 (ref 5.0–8.0)

## 2022-05-06 LAB — BASIC METABOLIC PANEL
Anion gap: 7 (ref 5–15)
BUN: 7 mg/dL (ref 6–20)
CO2: 25 mmol/L (ref 22–32)
Calcium: 8.9 mg/dL (ref 8.9–10.3)
Chloride: 103 mmol/L (ref 98–111)
Creatinine, Ser: 0.57 mg/dL (ref 0.44–1.00)
GFR, Estimated: >60 mL/min (ref >60)
Glucose, Bld: 179 mg/dL — ABNORMAL HIGH (ref 70–99)
Potassium: 3.5 mmol/L (ref 3.5–5.1)
Sodium: 135 mmol/L (ref 135–145)

## 2022-05-06 MED ORDER — ACETAMINOPHEN 325 MG PO TABS: 650.0000 mg | ORAL_TABLET | Freq: Four times a day (QID) | ORAL | Status: DC | PRN

## 2022-05-06 MED ORDER — BENZTROPINE MESYLATE 1 MG PO TABS: 1.0000 mg | ORAL_TABLET | Freq: Four times a day (QID) | ORAL | Status: DC | PRN

## 2022-05-06 MED ORDER — CEFADROXIL 500 MG PO CAPS: 500.0000 mg | ORAL_CAPSULE | Freq: Two times a day (BID) | ORAL | 0 refills | Status: DC

## 2022-05-06 MED ORDER — HALOPERIDOL 5 MG PO TABS: 5.0000 mg | ORAL_TABLET | Freq: Four times a day (QID) | ORAL | Status: DC | PRN

## 2022-05-06 MED ORDER — IOHEXOL 300 MG/ML  SOLN
100.0000 mL | Freq: Once | INTRAMUSCULAR | Status: AC | PRN
  Administered 2022-05-06: 100 mL via INTRAVENOUS

## 2022-05-06 MED ORDER — NICOTINE POLACRILEX 2 MG MT GUM
2.0000 mg | CHEWING_GUM | OROMUCOSAL | Status: DC | PRN
  Administered 2022-05-06 – 2022-05-10 (×12): 2 mg via ORAL
  Filled 2022-05-06 (×8): qty 1

## 2022-05-06 MED ORDER — SODIUM CHLORIDE (PF) 0.9 % IJ SOLN
INTRAMUSCULAR | Status: AC
  Filled 2022-05-06: qty 50

## 2022-05-06 MED ORDER — HYDROXYZINE HCL 25 MG PO TABS
25.0000 mg | ORAL_TABLET | Freq: Three times a day (TID) | ORAL | Status: DC | PRN
  Administered 2022-05-06 – 2022-05-07 (×2): 25 mg via ORAL
  Filled 2022-05-06: qty 1

## 2022-05-06 MED ORDER — ALUM & MAG HYDROXIDE-SIMETH 200-200-20 MG/5ML PO SUSP: 30.0000 mL | ORAL | Status: DC | PRN

## 2022-05-06 MED ORDER — TRAZODONE HCL 50 MG PO TABS
50.0000 mg | ORAL_TABLET | Freq: Every evening | ORAL | Status: DC | PRN
  Administered 2022-05-06: 50 mg via ORAL

## 2022-05-06 MED ORDER — MAGNESIUM HYDROXIDE 400 MG/5ML PO SUSP: 30.0000 mL | Freq: Every day | ORAL | Status: DC | PRN

## 2022-05-06 NOTE — ED Triage Notes (Signed)
Patient said she has a knot above her pubic bone that is moving into her groin on the left side. She said she thinks it is cancer.

## 2022-05-06 NOTE — Progress Notes (Signed)
Pt is a 56 y.o. female who voluntarily presented at [facility] via walk in with her boyfriend.  Pt says that she has become "more irritable" lately, and she is sad that she relapsed on alcohol after having been sober for 1 week.  Pt says she drinks 5 Busch beers per day.  Smokes 1 ppd of cigarettes.  Pt denies SI/HI.  Pt said she was having what she thought was visual hallucinations this past Sunday where she saw "spots" in front of her.    Pt said she is scheduled to have hernia repair surgery soon. No other medical diagnoses were identified.   Pt has hx of depression and anxiety and "bipolar" disorder.    Skin search completed with no contraband found.  Notations of skin findings documented on flow sheets.   Pt said her goal for this hospitalization was to learn ways how to control her behavior when pt gets  irritable.    Pt signed admission paperwork and verbalized understanding of plan of care.

## 2022-05-06 NOTE — Discharge Instructions (Addendum)
Please return to the ED with any new or worsening symptoms such as fevers, inability to have bowel movements, nonreducible hernia Please read attached guide concerning inguinal hernias Please follow-up with the general surgery team I referred you to.  You will need to call and make an appointment to be seen. Please begin taking antibiotics I prescribed for you.  Your urinalysis shows a urinary tract infection.

## 2022-05-06 NOTE — Progress Notes (Signed)
D) Pt received calm, visible, participating in milieu, and in no acute distress. Pt A & O x4. Pt denies SI, HI, A/ V H, depression, anxiety and pain at this time. A) Pt encouraged to drink fluids. Pt encouraged to come to staff with needs. Pt encouraged to attend and participate in groups. Pt encouraged to set reachable goals.  R) Pt remained safe on unit, in no acute distress, will continue to assess.      05/06/22 1930  Psych Admission Type (Psych Patients Only)  Admission Status Voluntary  Psychosocial Assessment  Patient Complaints Anxiety;Insomnia  Eye Contact Brief  Facial Expression Animated  Affect Appropriate to circumstance  Speech Logical/coherent  Interaction Assertive  Motor Activity Fidgety  Appearance/Hygiene Unremarkable  Behavior Characteristics Cooperative  Mood Anxious  Thought Process  Coherency WDL  Content WDL  Delusions None reported or observed  Perception Hallucinations  Hallucination Visual (reported)  Judgment Impaired  Confusion None  Danger to Self  Current suicidal ideation? Denies  Danger to Others  Danger to Others None reported or observed

## 2022-05-06 NOTE — Progress Notes (Signed)
Adult Psychoeducational Group Note  Date:  05/06/2022 Time:  8:51 PM  Group Topic/Focus:  AA/Wrap-Up  Participation Level:  Did Not Attend  Participation Quality:   n/a  Affect:   n/a  Cognitive:   n/a  Insight: None  Engagement in Group:   n/a  Modes of Intervention:  Discussion and Education  Additional Comments:   Pt did not attend the AA/Wrap-Up group  Linsey Hirota M Marigold Mom 05/06/2022, 8:51 PM

## 2022-05-06 NOTE — ED Provider Notes (Signed)
[facility]ENCY [facility] Provider Note   CSN: [id number] Arrival date & time: 05/06/22  8850     History  Chief Complaint  Patient presents with   Groin Pain    Theresa Rios is a 56 y.o. female with medical history significant for seizures, alcohol abuse.  Patient presents to ED for evaluation of left groin pain and swelling.  Patient reports that for the last 1 year she has had a spot on her left groin that is progressively worsened, gotten larger.  The patient states that she is concerned "that this is cancer".  The patient states that she has not been seen by any medical professionals about this issue.  Patient denies any dysuria, fevers, nausea or vomiting, night sweats, body aches or chills, weight loss.  Groin Pain       Home Medications Prior to Admission medications   Medication Sig Start Date End Date Taking? Authorizing Provider  cefadroxil (DURICEF) 500 MG capsule Take 1 capsule (500 mg total) by mouth 2 (two) times daily. 05/06/22  Yes Al Decant, PA-C  chlordiazePOXIDE (LIBRIUM) 25 MG capsule 50mg  PO TID x 1D, then 25-50mg  PO BID X 1D, then 25-50mg  PO QD X 1D 05/05/22   05/07/22, MD  cyclobenzaprine (FLEXERIL) 10 MG tablet Take 10 mg by mouth 3 (three) times daily as needed for muscle spasms.    [provider]  gabapentin (NEURONTIN) 300 MG capsule Take 2 capsules (600 mg total) by mouth at bedtime. Patient not taking: Reported on 04/12/2022 03/27/22 04/26/22  04/28/22, MD  levETIRAcetam (KEPPRA) 1000 MG tablet Take 1 tablet (1,000 mg total) by mouth 2 (two) times daily. 04/13/22   06/13/22, DO  lidocaine (LIDODERM) 5 % Place 1 patch onto the skin daily. Remove & Discard patch within 12 hours or as directed by MD 04/13/22   06/13/22, DO  metroNIDAZOLE (FLAGYL) 500 MG tablet Take 1 tablet (500 mg total) by mouth 2 (two) times daily. Please take this medication as prescribed for 5 more days and have your  husband take it also as prescribed for 7 days. 04/13/22   06/13/22, DO  nicotine (NICODERM CQ - DOSED IN MG/24 HOURS) 14 mg/24hr patch Place 1 patch (14 mg total) onto the skin daily. Patient not taking: Reported on 04/12/2022 04/03/22   04/05/22, MD  sertraline (ZOLOFT) 100 MG tablet Take 1 tablet (100 mg total) by mouth daily. Patient not taking: Reported on 04/12/2022 04/03/22   04/05/22, MD  traZODone (DESYREL) 100 MG tablet Take 1 tablet (100 mg total) by mouth at bedtime as needed for sleep. Patient not taking: Reported on 04/12/2022 03/27/22 04/26/22  04/28/22, MD      Allergies    Patient has no known allergies.    Review of Systems   Review of Systems  Constitutional:  Negative for chills, diaphoresis, fever and unexpected weight change.  Gastrointestinal:  Negative for nausea and vomiting.  Genitourinary:  Negative for dysuria.  All other systems reviewed and are negative.   Physical Exam Updated Vital Signs BP (!) 140/106 (BP Location: Left Arm)   Pulse 97   Temp 98.4 F (36.9 C) (Oral)   Resp 16   LMP 05/17/2014   SpO2 100%  Physical Exam Vitals and nursing note reviewed.  Constitutional:      General: She is not in acute distress.    Appearance: Normal appearance. She is not ill-appearing, toxic-appearing or diaphoretic.  HENT:  Head: Normocephalic and atraumatic.     Nose: Nose normal. No congestion.     Mouth/Throat:     Mouth: Mucous membranes are moist.     Pharynx: Oropharynx is clear.  Eyes:     Extraocular Movements: Extraocular movements intact.     Conjunctiva/sclera: Conjunctivae normal.     Pupils: Pupils are equal, round, and reactive to light.  Cardiovascular:     Rate and Rhythm: Normal rate and regular rhythm.  Pulmonary:     Effort: Pulmonary effort is normal.     Breath sounds: Normal breath sounds. No wheezing.  Abdominal:     General: Abdomen is flat. Bowel sounds are normal.     Palpations: Abdomen is soft.      Tenderness: There is no abdominal tenderness.  Musculoskeletal:     Cervical back: Normal range of motion and neck supple. No tenderness.       Legs:  Skin:    General: Skin is warm and dry.     Capillary Refill: Capillary refill takes less than 2 seconds.  Neurological:     Mental Status: She is alert and oriented to person, place, and time.     ED Results / Procedures / Treatments   Labs (all labs ordered are listed, but only abnormal results are displayed) Labs Reviewed  BASIC METABOLIC PANEL - Abnormal; Notable for the following components:      Result Value   Glucose, Bld 179 (*)    All other components within normal limits  URINALYSIS, ROUTINE W REFLEX MICROSCOPIC - Abnormal; Notable for the following components:   APPearance HAZY (*)    Nitrite POSITIVE (*)    Leukocytes,Ua SMALL (*)    Bacteria, UA MANY (*)    All other components within normal limits  URINE CULTURE  CBC    EKG None  Radiology CT Head Wo Contrast  Result Date: 05/05/2022 CLINICAL DATA:  Headache. EXAM: CT HEAD WITHOUT CONTRAST TECHNIQUE: Contiguous axial images were obtained from the base of the skull through the vertex without intravenous contrast. RADIATION DOSE REDUCTION: This exam was performed according to the departmental dose-optimization program which includes automated exposure control, adjustment of the mA and/or kV according to patient size and/or use of iterative reconstruction technique. COMPARISON:  April 11, 2022 FINDINGS: Brain: No evidence of acute infarction, hemorrhage, hydrocephalus, extra-axial collection or mass lesion/mass effect. Vascular: No hyperdense vessel or unexpected calcification. Skull: Normal. Negative for fracture or focal lesion. Sinuses/Orbits: There appears to be a small amount of fluid dependently in the maxillary sinuses. Other: None. IMPRESSION: There is a small amount of fluid in the maxillary sinuses which could be due to acute sinusitis in the appropriate  clinical setting. Recommend clinical correlation. No acute intracranial abnormalities. Electronically Signed   By: Gerome Sam III M.D.   On: 05/05/2022 16:03    Procedures Procedures   Medications Ordered in ED Medications  sodium chloride (PF) 0.9 % injection (has no administration in time range)  iohexol (OMNIPAQUE) 300 MG/ML solution 100 mL (100 mLs Intravenous Contrast Given 05/06/22 6195)    ED Course/ Medical Decision Making/ A&P                           Medical Decision Making Amount and/or Complexity of Data Reviewed Labs: ordered. Radiology: ordered.  Risk Prescription drug management.   56 year old female presents to the ED for evaluation.  Please see HPI for further details.  On examination the  patient is afebrile and nontachycardic.  The patient lung sounds are clear bilaterally, she is not hypoxic.  The patient abdomen is soft and compressible.  The patient does have a hard nonmobile mass to the left side of her groin, there is no overlying skin change.  This could represent a inguinal hernia however it is nonreducible.  Patient worked up utilizing the following labs and imaging studies interpreted by me personally: - BMP unremarkable - CBC unremarkable - Urinalysis positive for nitrites, patient recently seen and treated for UTI/trichomoniasis.  Patient states she finished her course of antibiotics on Friday. - CT abdomen pelvis shows a large left-sided inguinal hernia which is most likely accountable for the patient's symptoms  After CT scan was obtained, patient was placed in proper stretcher bed to allow for reduction.  The patient hernia was reduced without issue.  The patient will be discharged home and advised to follow-up with general surgery for possible hernia repair.  Patient will be sent home on 7 days of Duricef for UTI, patient urine cultured. The patient was given return precautions and she voiced understanding.  The patient had all of her questions  answered to her satisfaction prior to discharge.  The patient stable for discharge at this time.  Final Clinical Impression(s) / ED Diagnoses Final diagnoses:  Unilateral inguinal hernia without obstruction or gangrene, recurrence not specified  Urinary tract infection without hematuria, site unspecified    Rx / DC Orders ED Discharge Orders          Ordered    cefadroxil (DURICEF) 500 MG capsule  2 times daily        05/06/22 1117              Al Decant, PA-C 05/06/22 1118    Kneller, Mendes K, DO 05/06/22 1318

## 2022-05-06 NOTE — H&P (Signed)
Behavioral Health Medical Screening Exam  HPI: Theresa Rios is a 56 y.o. African-American female who presents as a voluntary walk-in accompanied by her female friend to [facility] behavioral health from [facility] emergency room for complaint of seizures that took place last Tuesday, August 22nd 2023, depression, drug use and alcohol dependence.  Patient was admitted to [facility] last month in July 2023, treated and discharge.  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  Patient is homeless and currently living in a motel with a friend.  Assessment: On assessment today, patient is seen face-to-face and examined in the screen room.  Patient appears agitated and reporting she needs help for her seizures, depression, drug use, and alcohol dependence.  Chart reviewed and findings shared with the treatment team and discussed with Dr. Lucianne Muss.  Patient alert and oriented to name place time and situation.  Able to maintain fair eye contact with the provider and speech slightly garbled and decreased in volume.  Presents with anxious, depressed, hopeless and worthless mood and affect congruent.  Thought process coherent and thought content within normal limits.  Sensorium fair.  Patient denies suicidal ideation, homicidal ideation, auditory hallucinations or paranoia/delusions.. Reports visual hallucination of seeing colored spots continuously Reports suicidal attempt in the in July 2023 by attempting to OD on melatonin. Denies self-injurious behavior and denies family history of mental illness. Patient reports sleeping over 7 hours last night at the hospital and reports support system as his female friend. Reports being followed for her psychiatric problems by Dr. Azzie Glatter who also manages her medications. Patient reports history of  alcohol use and dependence by drinking 6 cans of 12 ounces beer daily, reported drug use of cocaine with last cocaine use on Wednesday the May 01, 2022, however, felt to report the quantity of cocaine consumed.  Instructions provided on the impact of illicit drugs and alcohol drinking and overall psychiatric and mental wellbeing.  Encouragement provided on total abstinence from substance use. Patient reports anxiety and rated as 6/10, with 10 being the worst. Reports symptoms of depression and characterized as crying spells, irritability, hopelessness, worthlessness, anhedonia and poor concentration. Denies history of trauma or abuse and denies access to firearms at home.  Disposition: Based on my evaluation, patient meets the criteria for inpatient psychiatric admission.  Patient has negative COVID-19 test result and preadmission orders initiated.  Total Time spent with patient: 1 hour  Psychiatric Specialty Exam:  Presentation  General Appearance: Appropriate for Environment; Fairly Groomed  Eye Contact:Fair  Speech:Garbled  Speech Volume:Decreased  Handedness:Right  Mood and Affect  Mood:Anxious; Depressed; Hopeless; Worthless  Affect:Congruent  Art gallery manager Processes:Coherent  Descriptions of Associations:Intact  Orientation:Full (Time, Place and Person)  Thought Content:WDL  History of Schizophrenia/Schizoaffective disorder:No data recorded Duration of Psychotic Symptoms:No data recorded Hallucinations:Hallucinations: Visual Description of Visual Hallucinations: Seeing specks of light  Ideas of Reference:None  Suicidal Thoughts:Suicidal Thoughts: No  Homicidal Thoughts:Homicidal Thoughts: No  Sensorium  Memory:Immediate Fair; Recent Fair; Remote Fair  Judgment:Fair  Insight:Fair  Executive Functions  Concentration:Good  Attention Span:Good  Recall:Fair  Fund of Knowledge:Fair  Language:Fair  Psychomotor Activity  Psychomotor  Activity:Psychomotor Activity: Increased; Restlessness  Assets  Assets:Communication Skills; Physical Health  Sleep  Sleep:Sleep: Good Number of Hours of Sleep: 7  Physical Exam: Physical Exam Vitals and nursing note reviewed.  HENT:  Head: Normocephalic.     Right Ear: External ear normal.     Left Ear: External ear normal.     Mouth/Throat:     Mouth: Mucous membranes are moist.     Pharynx: Oropharynx is clear.  Eyes:     Conjunctiva/sclera: Conjunctivae normal.     Pupils: Pupils are equal, round, and reactive to light.  Cardiovascular:     Rate and Rhythm: Normal rate.     Pulses: Normal pulses.  Pulmonary:     Effort: Pulmonary effort is normal.  Abdominal:     Palpations: Abdomen is soft.  Genitourinary:    Comments: deferred Musculoskeletal:        General: Normal range of motion.     Cervical back: Normal range of motion.  Skin:    General: Skin is warm.  Neurological:     General: No focal deficit present.     Mental Status: She is alert.  Psychiatric:        Mood and Affect: Mood normal.    Review of Systems  Constitutional: Negative.  Negative for chills and fever.  HENT: Negative.  Negative for hearing loss and tinnitus.   Eyes: Negative.  Negative for blurred vision and double vision.  Respiratory: Negative.  Negative for cough, sputum production, shortness of breath and wheezing.   Cardiovascular: Negative.  Negative for chest pain and palpitations.  Gastrointestinal: Negative.  Negative for heartburn and nausea.  Genitourinary: Negative.  Negative for dysuria and urgency.  Musculoskeletal: Negative.  Negative for myalgias and neck pain.  Skin: Negative.  Negative for itching and rash.  Neurological:  Positive for seizures (Hx of seizures). Negative for dizziness, tingling and headaches.  Endo/Heme/Allergies: Negative.  Negative for environmental allergies and polydipsia. Does not bruise/bleed easily.  Psychiatric/Behavioral:  Positive for  depression and substance abuse. The patient is nervous/anxious.    Blood pressure 126/77, pulse 95, temperature 99 F (37.2 C), temperature source Oral, resp. rate 18, last menstrual period 05/17/2014. There is no height or weight on file to calculate BMI.  Musculoskeletal: Strength & Muscle Tone: within normal limits Gait & Station: normal Patient leans: N/A  [place] Scale:  Flowsheet Row ED from 05/06/2022 in [facility] Admission (Discharged) [facility] from 03[facility]19/2022 in [facility] EMERGENCY DEPARTMENT  C-SSRS RISK CATEGORY No Risk High Risk No Risk       Recommendations:  Based on my evaluation the patient appears to have an emergency medical condition for which I recommend the patient be transferred to the emergency department for further evaluation.  Cecilie Lowers, FNP 05/06/2022, 4:05 PM

## 2022-05-06 NOTE — Tx Team (Signed)
Initial Treatment Plan 05/06/2022 6:01 PM [name] VWP:[id number]    PATIENT STRESSORS: Financial difficulties   Medication change or noncompliance   Substance abuse     PATIENT STRENGTHS: Communication skills  Supportive family/friends    PATIENT IDENTIFIED PROBLEMS: Irritable   Substance use  Anxiety                 DISCHARGE CRITERIA:  Improved stabilization in mood, thinking, and/or behavior Motivation to continue treatment in a less acute level of care  PRELIMINARY DISCHARGE PLAN: Attend 12-step recovery group Outpatient therapy  PATIENT/FAMILY INVOLVEMENT: This treatment plan has been presented to and reviewed with the patient, Theresa Rios.  The patient has been given the opportunity to ask questions and make suggestions.  Garnette Scheuermann, RN 05/06/2022, 6:01 PM

## 2022-05-07 DIAGNOSIS — F332 Major depressive disorder, recurrent severe without psychotic features: Secondary | ICD-10-CM | POA: Diagnosis not present

## 2022-05-07 LAB — CBC
HCT: 41 % (ref 36.0–46.0)
Hemoglobin: 12.9 g/dL (ref 12.0–15.0)
MCH: 28.4 pg (ref 26.0–34.0)
MCHC: 31.5 g/dL (ref 30.0–36.0)
MCV: 90.1 fL (ref 80.0–100.0)
Platelets: 209 10*3/uL (ref 150–400)
RBC: 4.55 MIL/uL (ref 3.87–5.11)
RDW: 13.7 % (ref 11.5–15.5)
WBC: 8.4 10*3/uL (ref 4.0–10.5)
nRBC: 0 % (ref 0.0–0.2)

## 2022-05-07 LAB — URINALYSIS, COMPLETE (UACMP) WITH MICROSCOPIC
Bilirubin Urine: NEGATIVE
Bilirubin Urine: NEGATIVE
Glucose, UA: NEGATIVE mg/dL
Glucose, UA: NEGATIVE mg/dL
Hgb urine dipstick: NEGATIVE
Hgb urine dipstick: NEGATIVE
Ketones, ur: NEGATIVE mg/dL
Ketones, ur: NEGATIVE mg/dL
Nitrite: NEGATIVE
Nitrite: NEGATIVE
Protein, ur: NEGATIVE mg/dL
Protein, ur: NEGATIVE mg/dL
Specific Gravity, Urine: 1.036 — ABNORMAL HIGH (ref 1.005–1.030)
Specific Gravity, Urine: 1.011 (ref 1.005–1.030)
pH: 6 (ref 5.0–8.0)
pH: 6 (ref 5.0–8.0)

## 2022-05-07 LAB — LEVETIRACETAM LEVEL: Levetiracetam Lvl: <2 ug/mL — ABNORMAL LOW (ref 10.0–40.0)

## 2022-05-07 LAB — TSH: TSH: 2.965 u[IU]/mL (ref 0.350–4.500)

## 2022-05-07 LAB — COMPREHENSIVE METABOLIC PANEL
ALT: 25 U/L (ref 0–44)
AST: 18 U/L (ref 15–41)
Albumin: 3.6 g/dL (ref 3.5–5.0)
Alkaline Phosphatase: 63 U/L (ref 38–126)
Anion gap: 6 (ref 5–15)
BUN: 8 mg/dL (ref 6–20)
CO2: 27 mmol/L (ref 22–32)
Calcium: 9.3 mg/dL (ref 8.9–10.3)
Chloride: 109 mmol/L (ref 98–111)
Creatinine, Ser: 0.65 mg/dL (ref 0.44–1.00)
GFR, Estimated: >60 mL/min (ref >60)
Glucose, Bld: 124 mg/dL — ABNORMAL HIGH (ref 70–99)
Potassium: 3.8 mmol/L (ref 3.5–5.1)
Sodium: 142 mmol/L (ref 135–145)
Total Bilirubin: 0.4 mg/dL (ref 0.3–1.2)
Total Protein: 7.3 g/dL (ref 6.5–8.1)

## 2022-05-07 LAB — RAPID URINE DRUG SCREEN, HOSP PERFORMED
Amphetamines: NOT DETECTED
Barbiturates: NOT DETECTED
Benzodiazepines: POSITIVE — AB
Cocaine: POSITIVE — AB
Opiates: NOT DETECTED
Tetrahydrocannabinol: NOT DETECTED

## 2022-05-07 LAB — MISC LABCORP TEST (SEND OUT): Labcorp test code: 71543

## 2022-05-07 MED ORDER — SERTRALINE HCL 100 MG PO TABS
100.0000 mg | ORAL_TABLET | Freq: Every day | ORAL | Status: DC
  Administered 2022-05-07 – 2022-05-10 (×4): 100 mg via ORAL
  Filled 2022-05-07: qty 7
  Filled 2022-05-07 (×6): qty 1

## 2022-05-07 MED ORDER — GABAPENTIN 300 MG PO CAPS
600.0000 mg | ORAL_CAPSULE | Freq: Every day | ORAL | Status: DC
  Administered 2022-05-07 – 2022-05-09 (×3): 600 mg via ORAL
  Filled 2022-05-07 (×5): qty 2

## 2022-05-07 MED ORDER — TRAZODONE HCL 100 MG PO TABS
100.0000 mg | ORAL_TABLET | Freq: Every evening | ORAL | Status: DC | PRN
  Administered 2022-05-07 – 2022-05-09 (×3): 100 mg via ORAL
  Filled 2022-05-07 (×3): qty 1

## 2022-05-07 MED ORDER — HYDROXYZINE HCL 25 MG PO TABS
25.0000 mg | ORAL_TABLET | Freq: Four times a day (QID) | ORAL | Status: AC | PRN
  Administered 2022-05-08 – 2022-05-10 (×6): 25 mg via ORAL
  Filled 2022-05-07 (×2): qty 1
  Filled 2022-05-07: qty 10
  Filled 2022-05-07 (×5): qty 1

## 2022-05-07 MED ORDER — LOPERAMIDE HCL 2 MG PO CAPS: 2.0000 mg | ORAL_CAPSULE | ORAL | Status: AC | PRN

## 2022-05-07 MED ORDER — GABAPENTIN 300 MG PO CAPS
300.0000 mg | ORAL_CAPSULE | Freq: Every day | ORAL | Status: DC
  Administered 2022-05-08 – 2022-05-10 (×3): 300 mg via ORAL
  Filled 2022-05-07 (×2): qty 1
  Filled 2022-05-07: qty 21
  Filled 2022-05-07 (×2): qty 1

## 2022-05-07 MED ORDER — CEFADROXIL 500 MG PO CAPS
500.0000 mg | ORAL_CAPSULE | Freq: Two times a day (BID) | ORAL | Status: DC
  Administered 2022-05-07 – 2022-05-10 (×6): 500 mg via ORAL
  Filled 2022-05-07 (×3): qty 1
  Filled 2022-05-07: qty 8
  Filled 2022-05-07 (×2): qty 1
  Filled 2022-05-07: qty 8
  Filled 2022-05-07 (×3): qty 1

## 2022-05-07 MED ORDER — ADULT MULTIVITAMIN W/MINERALS CH
1.0000 | ORAL_TABLET | Freq: Every day | ORAL | Status: DC
  Administered 2022-05-07 – 2022-05-10 (×4): 1 via ORAL
  Filled 2022-05-07 (×6): qty 1

## 2022-05-07 MED ORDER — LORAZEPAM 1 MG PO TABS: 1.0000 mg | ORAL_TABLET | Freq: Four times a day (QID) | ORAL | Status: AC | PRN

## 2022-05-07 MED ORDER — ONDANSETRON 4 MG PO TBDP: 4.0000 mg | ORAL_TABLET | Freq: Four times a day (QID) | ORAL | Status: AC | PRN

## 2022-05-07 MED ORDER — THIAMINE HCL 100 MG PO TABS
100.0000 mg | ORAL_TABLET | Freq: Every day | ORAL | Status: DC
  Administered 2022-05-08 – 2022-05-10 (×3): 100 mg via ORAL
  Filled 2022-05-07 (×5): qty 1

## 2022-05-07 NOTE — [facility] Counselor (Signed)
Adult Comprehensive Assessment  Patient ID: Theresa Rios, female   DOB: 05/16/1966, 56 y.o.   MRN: [id number]  Information Source: Information source: Patient  Current Stressors:  Patient states their primary concerns and needs for treatment are:: "To feel better.  I am having bad thoughts and I don't want to go anywhere" Patient states their goals for this hospitilization and ongoing recovery are:: "to get treatment and go to a place where I can get clean" Educational / Learning stressors: 12th grade education Employment / Job issues: unemployed Family Relationships: reports that her "husband" left her about a month ago Surveyor, quantity / Lack of resources (include bankruptcy): Patient reports that she has no income Housing / Lack of housing: homeless Physical health (include injuries & life threatening diseases): denies Social relationships: Reports a few social relationships such as her sister Substance abuse: alcohol and cocaine Bereavement / Loss: parents are decreased  Living/Environment/Situation:  Living Arrangements: Spouse/significant other Living conditions (as described by patient or guardian): homeless Who else lives in the home?: sometimes her "husband" How long has patient lived in current situation?: "few months" What is atmosphere in current home: Dangerous  Family History:  Marital status: Separated Separated, when?: "few months ago, maybe 3 months" What types of issues is patient dealing with in the relationship?: "He left" Are you sexually active?: No What is your sexual orientation?: heterosexual Has your sexual activity been affected by drugs, alcohol, medication, or emotional stress?: no Does patient have children?: No  Childhood History:  By whom was/is the patient raised?: Both parents Description of patient's relationship with caregiver when they were a child: "It was ok" Patient's description of current relationship with people who raised him/her: "they are  decreased" How were you disciplined when you got in trouble as a child/adolescent?: "spankings Does patient have siblings?: Yes Number of Siblings: 11 Description of patient's current relationship with siblings: "I have 3 living siblings" Did patient suffer any verbal/emotional/physical/sexual abuse as a child?: No Did patient suffer from severe childhood neglect?: No Has patient ever been sexually abused/assaulted/raped as an adolescent or adult?: No Was the patient ever a victim of a crime or a disaster?: No Witnessed domestic violence?: No Has patient been affected by domestic violence as an adult?: No  Education:  Highest grade of school patient has completed: 12th Currently a student?: No Learning disability?: No  Employment/Work Situation:   Employment Situation: Unemployed Patient's Job has Been Impacted by Current Illness: No What is the Longest Time Patient has Held a Job?: "I don't know" Has Patient ever Been in the [employer]?: No  Financial Resources:   Financial resources: No income Does patient have a Lawyer or guardian?: No  Alcohol/Substance Abuse:   What has been your use of drugs/alcohol within the last 12 months?: reports daily alcohol intake and cocaine several times per week If attempted suicide, did drugs/alcohol play a role in this?: No Alcohol/Substance Abuse Treatment Hx: Denies past history Has alcohol/substance abuse ever caused legal problems?: No  Social Support System:   Forensic psychologist System: None Describe Community Support System: "no one" Type of faith/religion: Christian How does patient's faith help to cope with current illness?: pray  Leisure/Recreation:   Do You Have Hobbies?: No  Strengths/Needs:   What is the patient's perception of their strengths?: none Patient states they can use these personal strengths during their treatment to contribute to their recovery: unknown Patient states these barriers may  affect/interfere with their treatment: none Patient states these barriers  may affect their return to the community: none Other important information patient would like considered in planning for their treatment: none  Discharge Plan:   Currently receiving community mental health services: No Patient states concerns and preferences for aftercare planning are: request therapy Patient states they will know when they are safe and ready for discharge when: "when I can get treatment outside of this place" Does patient have access to transportation?: Yes Does patient have financial barriers related to discharge medications?: Yes Patient description of barriers related to discharge medications: no income and no insurance Plan for living situation after discharge: "with a friend, maybe" Will patient be returning to same living situation after discharge?: No  Summary/Recommendations:   Summary and Recommendations (to be completed by the evaluator): Theresa Rios is a 56 year old woman that was admitted into [facility] after an increase in depression, anxiety and suicidal thoughts.  This is her second admission in the past month. She reports that she is homeless and has been the past few months.  She used the term "husband" and "boyfriend" throughout the assessment.  She reports that her significant other "left me" about 2 months ago and moved in with his mother.  She reports that they are separated.  She reports that her parents are currently deceased but reports no trauma throughout childhood. She reports that she does not work, nor does she have insurance. Patient drinks alcohol daily and uses cocaine intermittently. While here, Diamon can benefit from crisis stabilization, medication management, therapeutic milieu, and referrals for services.   Marinda Elk. 05/07/2022

## 2022-05-07 NOTE — H&P (Signed)
Psychiatric Admission Assessment Adult  Patient Identification: Theresa Rios MRN:  [id number] Date of Evaluation:  05/07/2022 Chief Complaint:  MDD (major depressive disorder), recurrent severe, without psychosis (HCC) [F33.2] Principal Diagnosis: MDD (major depressive disorder), recurrent severe, without psychosis (HCC) Diagnosis:  Principal Problem:   MDD (major depressive disorder), recurrent severe, without psychosis (HCC) Active Problems:   Alcohol use disorder   Insomnia  History of Present Illness: Theresa Rios is a 56 yo [place] American female with a history of MDD, seizures & alcohol use d/o who initially presented to the [facility] with complaints of L groin pain & swelling & concerns for it being cancerous. CT scan revealed a large L sided inguinal hernia, antibiotics were ordered & pt was advised to f/u with general surgery, but she presented to this [facility] instead requesting assistance with depressive symptoms & substance use. Pt was last admitted to this [facility] on 03/21/2022 after a suicide attempt by overdosing on sleep aides.   On assessment, pt reports feelings of hopelessness, helplessness, worthlessness, poor appetite, anhedonia, low energy levels & anxiety which led to her presenting back to this [facility]. She denies that she was suicidal prior to admission, and admitted to taking her medications prior to presenting to this [facility]. She is a poor historian, and states that she followed up with her discharge plan from the last admission and visited a psychiatrist, but is unable to state which Psychiatrist she went to.   Pt reports that she was drinking 4-5 cans of beers daily and doing approximately $10 worth of  cocaine daily as well. She reports that she has been drinking alcohol for over 10 years now, and has also been smoking crack for over 10 yrs as well. She reports that she smokes a pack of cigarettes on a daily basis. She denies any other substance use. Pt denies any history of emotional,  physical or sexual abuse. She reports a history of sustaining a head injury from working at the [employer] a year ago, and states that boxes fell on her back and on her face and caused her to have a fractured nose at that time.   Pt reports a history of seizures, states that the last one was last Tuesday. She states that she woke up with incontinence, and it only happens after a seizure. She states that her BF took her to the hospital after this happened. She denies any other medical issues. She denies any history of mental health issues in her family. She reports some support in her BF, denies being in touch with her family and states that she does not have any children. She denies any history of surgeries in the past, and denies having any allergies to food or medications.  During this encounter, pt presents with an anxious mood & affect is congruent. Attention to personal hygiene and grooming is fair, eye contact is good, speech is clear & coherent. Thought contents are organized and logical. There is no evidence of delusional thoughts. She reports seeing "spots" prior to presenting to this [facility], but pt currently denies SI/HI/AVH or paranoia.   We will restart of pt's home medications as follows;Restarting Zoloft 100 mg daily for depression, Gabapentin 300 mg daily in the mornings, Gabapentin 600 mg nightly for anxiety, Trazodone 100 mg nightly PRN for insomnia & Hydroxyzine 25 mg Q6 H PRN. Pt educated on rationales, benefits and possible side effects of all medications, and is agreeable to restarting them. Pt started on 500 mg duricef BID x  7 days for UTI while in the ED. Will continue this medication. Will also start Ativan 1 mg PRN Q 6 H with CIWAs >10.  Associated Signs/Symptoms: Depression Symptoms:  depressed mood, anhedonia, insomnia, feelings of worthlessness/guilt, hopelessness, anxiety, Duration of Depression Symptoms: No data recorded (Hypo) Manic Symptoms:   denies Anxiety Symptoms:   Excessive Worry, Psychotic Symptoms:   n/a PTSD Symptoms: NA Total Time spent with patient: 1 hour  Past Psychiatric History: MDD, ETOH use d/o  Is the patient at risk to self? No.  Has the patient been a risk to self in the past 6 months? Yes.    Has the patient been a risk to self within the distant past? Yes.    Is the patient a risk to others? No.  Has the patient been a risk to others in the past 6 months? No.  Has the patient been a risk to others within the distant past? No.   [place] Scale:  Flowsheet Row Admission (Current) [facility] Most recent reading at 05/06/2022  5:15 PM ED from 05/06/2022 in [facility] Most recent reading at 05/06/2022  7:01 AM Admission (Discharged) [facility] Most recent reading at 03/21/2022  3:50 AM  C-SSRS RISK CATEGORY No Risk No Risk High Risk        Prior Inpatient Therapy:   Prior Outpatient Therapy:    Alcohol Screening: 1. How often do you have a drink containing alcohol?: 4 or more times a week 2. How many drinks containing alcohol do you have on a typical day when you are drinking?: 5 or 6 3. How often do you have six or more drinks on one occasion?: Monthly AUDIT-C Score: 8 4. How often during the last year have you found that you were not able to stop drinking once you had started?: Never 5. How often during the last year have you failed to do what was normally expected from you because of drinking?: Never 6. How often during the last year have you needed a first drink in the morning to get yourself going after a heavy drinking session?: Never 7. How often during the last year have you had a feeling of guilt of remorse after drinking?: Never 8. How often during the last year have you been unable to remember what happened the night before because you had been drinking?: Never 9. Have  you or someone else been injured as a result of your drinking?: No 10. Has a relative or friend or a doctor or another health worker been concerned about your drinking or suggested you cut down?: Yes, during the last year Alcohol Use Disorder Identification Test Final Score (AUDIT): 12 Alcohol Brief Interventions/Follow-up: Alcohol education/Brief advice Substance Abuse History in the last 12 months:  Yes.   Consequences of Substance Abuse: NA Previous Psychotropic Medications: Yes  Psychological Evaluations: No  Past Medical History:  Past Medical History:  Diagnosis Date   Seizures (HCC)    History reviewed. No pertinent surgical history. Family History: History reviewed. No pertinent family history. Family Psychiatric  History: n/a Tobacco Screening:   Social History:  Social History   Substance and Sexual Activity  Alcohol Use Yes   Comment: 4-5 beers a day most days     Social History   Substance and Sexual Activity  Drug Use Yes   Types: Cocaine, Marijuana   Comment: last marijuana use:  2 mos; last cocaine use: 3 days ago    Additional Social History: Marital status: Separated Separated, when?: "few months ago, maybe 3 months" What types of issues is patient dealing with in the relationship?: "He left" Are you sexually active?: No What is your sexual orientation?: heterosexual Has your sexual activity been affected by drugs, alcohol, medication, or emotional stress?: no Does patient have children?: NoAllergies:  No Known Allergies Lab Results:  Results for orders placed or performed during the hospital encounter of 05/06/22 (from the past 48 hour(s))  Urinalysis, Complete w Microscopic Urine, Random     Status: Abnormal   Collection Time: 05/06/22  5:26 PM  Result Value Ref Range   Color, Urine YELLOW YELLOW   APPearance CLEAR CLEAR   Specific Gravity, Urine 1.036 (H) 1.005 - 1.030   pH 6.0 5.0 - 8.0   Glucose, UA NEGATIVE NEGATIVE mg/dL   Hgb urine dipstick  NEGATIVE NEGATIVE   Bilirubin Urine NEGATIVE NEGATIVE   Ketones, ur NEGATIVE NEGATIVE mg/dL   Protein, ur NEGATIVE NEGATIVE mg/dL   Nitrite NEGATIVE NEGATIVE   Leukocytes,Ua MODERATE (A) NEGATIVE   RBC / HPF 6-10 0 - 5 RBC/hpf   WBC, UA 21-50 0 - 5 WBC/hpf   Bacteria, UA RARE (A) NONE SEEN   Squamous Epithelial / LPF 0-5 0 - 5   Ca Oxalate Crys, UA PRESENT     Comment: Performed at [facility], [address] [place], [place] [address]  Rapid urine drug screen (hospital performed) not at [facility]     Status: Abnormal   Collection Time: 05/06/22  5:26 PM  Result Value Ref Range   Opiates NONE DETECTED NONE DETECTED   Cocaine POSITIVE (A) NONE DETECTED   Benzodiazepines POSITIVE (A) NONE DETECTED   Amphetamines NONE DETECTED NONE DETECTED   Tetrahydrocannabinol NONE DETECTED NONE DETECTED   Barbiturates NONE DETECTED NONE DETECTED    Comment: (NOTE) DRUG SCREEN FOR MEDICAL PURPOSES ONLY.  IF CONFIRMATION IS NEEDED FOR ANY PURPOSE, NOTIFY LAB WITHIN 5 DAYS.  LOWEST DETECTABLE LIMITS FOR URINE DRUG SCREEN Drug Class                     Cutoff (ng/mL) Amphetamine and metabolites    1000 Barbiturate and metabolites    200 Benzodiazepine                 200 Tricyclics and metabolites     300 Opiates and metabolites        300 Cocaine and metabolites        300 THC                            50 Performed at [facility], [address] [place], [place] [address]   CBC     Status: None   Collection Time: 05/07/22  6:46 AM  Result Value Ref Range   WBC 8.4 4.0 - 10.5 K/uL   RBC 4.55 3.87 - 5.11 MIL/uL   Hemoglobin 12.9 12.0 - 15.0 g/dL   HCT 09.841.0 11.936.0 - 14.746.0 %   MCV 90.1 80.0 - 100.0 fL   MCH 28.4 26.0 - 34.0 pg   MCHC 31.5 30.0 - 36.0 g/dL   RDW 82.913.7 56.211.5 - 13.015.5 %   Platelets 209 150 - 400 K/uL   nRBC 0.0 0.0 - 0.2 %    Comment: Performed at [facility], [address]  [place], [place] [address]  Comprehensive  metabolic panel     Status: Abnormal   Collection Time: 05/07/22  6:46 AM  Result Value Ref Range   Sodium 142 135 - 145 mmol/L    Comment: DELTA CHECK NOTED   Potassium 3.8 3.5 - 5.1 mmol/L   Chloride 109 98 - 111 mmol/L   CO2 27 22 - 32 mmol/L   Glucose, Bld 124 (H) 70 - 99 mg/dL    Comment: Glucose reference range applies only to samples taken after fasting for at least 8 hours.   BUN 8 6 - 20 mg/dL   Creatinine, Ser 6.04 0.44 - 1.00 mg/dL   Calcium 9.3 8.9 - 54.0 mg/dL   Total Protein 7.3 6.5 - 8.1 g/dL   Albumin 3.6 3.5 - 5.0 g/dL   AST 18 15 - 41 U/L   ALT 25 0 - 44 U/L   Alkaline Phosphatase 63 38 - 126 U/L   Total Bilirubin 0.4 0.3 - 1.2 mg/dL   GFR, Estimated >98 >11 mL/min    Comment: (NOTE) Calculated using the CKD-EPI Creatinine Equation (2021)    Anion gap 6 5 - 15    Comment: Performed at [facility], [address], [place], [place] [address]  TSH     Status: None   Collection Time: 05/07/22  6:46 AM  Result Value Ref Range   TSH 2.965 0.350 - 4.500 uIU/mL    Comment: Performed by a 3rd Generation assay with a functional sensitivity of <=0.01 uIU/mL. Performed at [facility], [address], [place], [place] [address]    Blood Alcohol level:  Lab Results  Component Value Date   [facility] <10 05/05/2022   ETH <10 04/11/2022   Metabolic Disorder Labs:  Lab Results  Component Value Date   HGBA1C 5.7 (H) 03/22/2022   MPG 116.89 03/22/2022   No results found for: "PROLACTIN" Lab Results  Component Value Date   CHOL 223 (H) 03/22/2022   TRIG 102 03/22/2022   HDL 91 03/22/2022   CHOLHDL 2.5 03/22/2022   VLDL 20 03/22/2022   LDLCALC 112 (H) 03/22/2022   Current Medications: Current Facility-Administered Medications  Medication Dose Route Frequency Provider Last Rate Last Admin   acetaminophen (TYLENOL) tablet 650 mg  650 mg Oral Q6H PRN Ntuen, Jesusita Oka, FNP       alum & mag hydroxide-simeth (MAALOX/MYLANTA) 200-200-20  MG/5ML suspension 30 mL  30 mL Oral Q4H PRN Ntuen, Jesusita Oka, FNP       haloperidol (HALDOL) tablet 5 mg  5 mg Oral Q6H PRN Ntuen, Jesusita Oka, FNP       And   benztropine (COGENTIN) tablet 1 mg  1 mg Oral Q6H PRN Ntuen, Jesusita Oka, FNP       cefadroxil (DURICEF) capsule 500 mg  500 mg Oral BID Starleen Blue, NP       [START ON 05/08/2022] gabapentin (NEURONTIN) capsule 300 mg  300 mg Oral Daily Kamani Magnussen, NP       gabapentin (NEURONTIN) capsule 600 mg  600 mg Oral QHS Shunsuke Granzow, NP       hydrOXYzine (ATARAX) tablet 25 mg  25 mg Oral Q6H PRN Christell Steinmiller, NP       loperamide (IMODIUM) capsule 2-4 mg  2-4 mg Oral PRN Corisa Montini, NP       LORazepam (ATIVAN) tablet 1 mg  1 mg Oral Q6H PRN Starleen Blue, NP       magnesium hydroxide (  MILK OF MAGNESIA) suspension 30 mL  30 mL Oral Daily PRN Ntuen, Jesusita Oka, FNP       multivitamin with minerals tablet 1 tablet  1 tablet Oral Daily Tilford Deaton, NP       nicotine polacrilex (NICORETTE) gum 2 mg  2 mg Oral PRN Massengill, Harrold Donath, MD   2 mg at 05/07/22 1323   ondansetron (ZOFRAN-ODT) disintegrating tablet 4 mg  4 mg Oral Q6H PRN Starleen Blue, NP       sertraline (ZOLOFT) tablet 100 mg  100 mg Oral Daily Starleen Blue, NP       [START ON 05/08/2022] thiamine (VITAMIN B1) tablet 100 mg  100 mg Oral Daily Syris Brookens, NP       traZODone (DESYREL) tablet 100 mg  100 mg Oral QHS PRN Starleen Blue, NP       PTA Medications: Medications Prior to Admission  Medication Sig Dispense Refill Last Dose   cefadroxil (DURICEF) 500 MG capsule Take 1 capsule (500 mg total) by mouth 2 (two) times daily. (Patient taking differently: Take 500 mg by mouth 2 (two) times daily. Take for 7 days starting on 05/06/22.) 14 capsule 0 not started yet   chlordiazePOXIDE (LIBRIUM) 25 MG capsule 50mg  PO TID x 1D, then 25-50mg  PO BID X 1D, then 25-50mg  PO QD X 1D (Patient taking differently: Take 25-50 mg by mouth See admin instructions. Take two capsules three times daily  for 1 day, then one to two capsules twice daily for 1 day and then one to two capsules daily for 1 day.) 10 capsule 0 not started yet   cyclobenzaprine (FLEXERIL) 10 MG tablet Take 10 mg by mouth at bedtime.   05/06/2022   gabapentin (NEURONTIN) 600 MG tablet Take 600 mg by mouth at bedtime.   05/05/2022   levETIRAcetam (KEPPRA) 1000 MG tablet Take 1 tablet (1,000 mg total) by mouth 2 (two) times daily. 60 tablet 3 05/05/2022   metroNIDAZOLE (FLAGYL) 500 MG tablet Take 500 mg by mouth See admin instructions. Take 1 tablet (500 mg total) by mouth 2 (two) times daily starting on 04/13/22. Please take this medication as prescribed for 5 more days and have your husband take it also as prescribed for 7 days.   05/05/2022   sertraline (ZOLOFT) 100 MG tablet Take 1 tablet (100 mg total) by mouth daily. (Patient taking differently: Take 100 mg by mouth at bedtime.) 30 tablet 1 05/05/2022   traZODone (DESYREL) 100 MG tablet Take 1 tablet (100 mg total) by mouth at bedtime as needed for sleep. (Patient taking differently: Take 100 mg by mouth at bedtime.) 30 tablet 0 05/06/2022   Musculoskeletal: Strength & Muscle Tone: within normal limits Gait & Station: normal Patient leans: N/A Psychiatric Specialty Exam:  Presentation  General Appearance: Appropriate for Environment; Fairly Groomed  Eye Contact:Fair  Speech:Clear and Coherent  Speech Volume:Normal  Handedness:Right  Mood and Affect  Mood:Anxious  Affect:Congruent  Thought Process  Thought Processes:Coherent  Duration of Psychotic Symptoms: No data recorded Past Diagnosis of Schizophrenia or Psychoactive disorder: No data recorded Descriptions of Associations:Intact  Orientation:Full (Time, Place and Person)  Thought Content:Logical  Hallucinations:Hallucinations: None Description of Visual Hallucinations: Seeing specks of light  Ideas of Reference:None  Suicidal Thoughts:Suicidal Thoughts: No  Homicidal Thoughts:Homicidal Thoughts:  No  Sensorium  Memory:Immediate Good  Judgment:Fair  Insight:Fair  Executive Functions  Concentration:Fair  Attention Span:Fair  Recall:Fair  Fund of Knowledge:Fair  Language:Fair  Psychomotor Activity  Psychomotor Activity:Psychomotor Activity: Normal  Assets  Assets:Communication Skills  Sleep  Sleep:Sleep: Poor Number of Hours of Sleep: 7  Physical Exam: Physical Exam Constitutional:      Appearance: Normal appearance.  HENT:     Head: Normocephalic.     Nose: Nose normal.  Eyes:     Pupils: Pupils are equal, round, and reactive to light.  Pulmonary:     Effort: Pulmonary effort is normal.  Musculoskeletal:        General: Normal range of motion.     Cervical back: Normal range of motion.  Neurological:     Mental Status: She is alert and oriented to person, place, and time.    Review of Systems  Constitutional: Negative.   HENT: Negative.    Eyes: Negative.   Respiratory: Negative.    Cardiovascular: Negative.   Gastrointestinal: Negative.   Genitourinary: Negative.   Musculoskeletal: Negative.   Skin: Negative.   Neurological: Negative.   Psychiatric/Behavioral:  Positive for depression and substance abuse. Negative for hallucinations, memory loss and suicidal ideas. The patient is nervous/anxious and has insomnia.    Blood pressure 133/77, pulse 78, temperature 98.5 F (36.9 C), resp. rate 16, height 5\' 2"  (1.575 m), weight 66.2 kg, last menstrual period 05/17/2014, SpO2 99 %. Body mass index is 26.7 kg/m.  Treatment Plan Summary: Daily contact with patient to assess and evaluate symptoms and progress in treatment and Medication management  Observation Level/Precautions:  15 minute checks  Laboratory:  Labs reviewed   Psychotherapy:  Unit Group sessions  Medications:  See [facility]  Consultations:  To be determined   Discharge Concerns:  Safety, medication compliance, mood stability  Estimated LOS: 5-7 days  Other:  N/A   PLAN Safety and  Monitoring: Voluntary admission to inpatient psychiatric unit for safety, stabilization and treatment Daily contact with patient to assess and evaluate symptoms and progress in treatment Patient's case to be discussed in multi-disciplinary team meeting Observation Level : q15 minute checks Vital signs: q12 hours Precautions: Safety  Labs Reviewed: Toxicology screen +Benzos, +Cocaine. ETOH<10. Pt denies using Benzos. EKG with sinus rhythm and probably large atrial enlargement. Will repeat in a day/two. [facility]. TSH from 7/14 1.567, Hemoglobin A1C-5.7, Lipid panel from 7/14 with total cholesterol at 223 and LDL-112. Pt educated on the importance of healthy food choices and exercise. UA with moderate leukocytes and rare bacteria. Pt started on 500 mg duricef BID x 7 days for UTI while in the ED. Will continue this medication.  Long Term Goal(s): Improvement in symptoms so as ready for discharge  Short Term Goals: Ability to identify changes in lifestyle to reduce recurrence of condition will improve, Ability to verbalize feelings will improve, Ability to disclose and discuss suicidal ideas, Ability to identify and develop effective coping behaviors will improve, Compliance with prescribed medications will improve, and Ability to identify triggers associated with substance abuse/mental health issues will improve  Diagnoses:  Principal Problem:   MDD (major depressive disorder), recurrent severe, without psychosis (HCC) Active Problems:   Alcohol use disorder   Insomnia  MDD recurrent no psychosis-Restart home meds as follows: -Start Zoloft 100 mg daily   Insomnia -Start Trazodone 100 mg nightly PRN  Anxiety -Start Gabapentin 300 mg in the mornings -Start Gabapentin 600 mg nightly  CIWA < or =10 -Continue Hydroxyzine 25 mg every 6 hours PRN  CIWA > 10 -Continue Ativan 1mg  tabs every 6 hours PRN  UTI -Start Duricef 500 mg BID x 7 days (ends 9/05 @ 0800)  Other PRNS -Continue  Tylenol 650 mg every 6 hours PRN for mild pain -Continue Maalox 30 mg every 4 hrs PRN for indigestion -Continue Imodium 2-4 mg as needed for diarrhea -Continue Milk of Magnesia as needed every 6 hrs for constipation -Continue Zofran disintegrating tabs every 6 hrs PRN for nausea   Discharge Planning: Social work and case management to assist with discharge planning and identification of hospital follow-up needs prior to discharge Estimated LOS: 5-7 days Discharge Concerns: Need to establish a safety plan; Medication compliance and effectiveness Discharge Goals: Return home with outpatient referrals for mental health follow-up including medication management/psychotherapy  I certify that inpatient services furnished can reasonably be expected to improve the patient's condition.    Starleen Blue, NP 8/29/20233:40 PM

## 2022-05-07 NOTE — Progress Notes (Signed)
Psychoeducational Group Note  Date:  05/07/2022 Time:  2024  Group Topic/Focus:  Wrap-Up Group:   The focus of this group is to help patients review their daily goal of treatment and discuss progress on daily workbooks.  Participation Level: Did Not Attend  Participation Quality:  Not Applicable  Affect:  Not Applicable  Cognitive:  Not Applicable  Insight:  Not Applicable  Engagement in Group: Not Applicable  Additional Comments:  The patient walked away when asked to attend wrap up group.   Hazle Coca S 05/07/2022, 8:24 PM

## 2022-05-07 NOTE — Plan of Care (Addendum)
  Problem: Activity: Goal: Interest or engagement in activities will improve Outcome: Progressing   Problem: Coping: Goal: Ability to verbalize frustrations and anger appropriately will improve Outcome: Not Progressing   Problem: Safety: Goal: Periods of time without injury will increase Outcome: Progressing   Problem: Coping: Goal: Ability to identify and develop effective coping behavior will improve Outcome: Not Progressing

## 2022-05-07 NOTE — Progress Notes (Signed)
   05/07/22 0921  Psych Admission Type (Psych Patients Only)  Admission Status Voluntary  Psychosocial Assessment  Patient Complaints Irritability;Anxiety  Eye Contact Brief  Facial Expression Animated  Affect Irritable  Speech Logical/coherent  Interaction Assertive  Motor Activity Fidgety  Appearance/Hygiene Unremarkable  Behavior Characteristics Irritable;Cooperative  Mood Irritable;Euthymic  Thought Process  Coherency WDL  Content WDL  Delusions None reported or observed  Perception WDL (pt denied VH)  Hallucination None reported or observed (pt denied VH)  Danger to Self  Current suicidal ideation? Denies  Agreement Not to Harm Self Yes  Description of Agreement verbally contracts for safety  Danger to Others  Danger to Others None reported or observed

## 2022-05-07 NOTE — Group Note (Deleted)
Recreation Therapy Group Note   Group Topic:Coping Skills  Group Date: 05/07/2022 Start Time: 1005 End Time: 1045 Facilitators: Bjorn Loser, NT Location: [address] Dayroom       Affect/Mood: {RT BHH Affect/Mood:26271}   Participation Level: {RT BHH Participation [employer]   Participation Quality: {RT BHH Participation Quality:26268}   Behavior: {RT BHH Group Behavior:26269}   Speech/Thought Process: {RT BHH Speech/Thought:26276}   Insight: {RT BHH Insight:26272}   Judgement: {RT BHH Judgement:26278}   Modes of Intervention: {RT BHH Modes of Intervention:26277}   Patient Response to Interventions:  {RT BHH Patient Response to Intervention:26274}   Education Outcome:  {RT BHH Education Outcome:26279}   Clinical Observations/Individualized Feedback: *** was *** in their participation of session activities and group discussion. Pt identified ***   Plan: {RT BHH Tx ZOXW:96045}   Bjorn Loser, NT,  05/07/2022 12:26 PM

## 2022-05-07 NOTE — [facility] Suicide Risk Assessment (Signed)
Suicide Risk Assessment  Admission Assessment    [facility] Admission Suicide Risk Assessment   Nursing information obtained from:  Patient Demographic factors:  Low socioeconomic status, Unemployed Current Mental Status:  NA Loss Factors:  NA Historical Factors:  Impulsivity Risk Reduction Factors:  Positive social support  Total Time spent with patient: 1 hour Principal Problem: MDD (major depressive disorder), recurrent severe, without psychosis (HCC) Diagnosis:  Principal Problem:   MDD (major depressive disorder), recurrent severe, without psychosis (HCC) Active Problems:   Alcohol use disorder   Insomnia  History of Present Illness: Kimberlye L. Rios is a 56 yo [place] American female with a history of MDD, seizures & alcohol use d/o who initially presented to the [facility] with complaints of L groin pain & swelling & concerns for it being cancerous. CT scan revealed a large L sided inguinal hernia, antibiotics were ordered & pt was advised to f/u with general surgery, but she presented to this [facility] instead requesting assistance with depressive symptoms & substance use. Pt was last admitted to this [facility] on 03/21/2022 after a suicide attempt by overdosing on sleep aides.   Continued Clinical Symptoms: On assessment, pt reports feelings of hopelessness, helplessness, worthlessness, poor appetite, anhedonia, low energy levels & anxiety for at least two weeks which led to her presenting back to this [facility]. Pt states that she was taking her medications, but it is highly unlikely that she was compliant with meds and discharge plan from the last admission. Continuous hospitalization is necessary to treat and stabilize her mood.  Alcohol Use Disorder Identification Test Final Score (AUDIT): 12 The "Alcohol Use Disorders Identification Test", Guidelines for Use in Primary Care, Second Edition.  World Science writer [facility]). Score between 0-7:  no or low risk or alcohol related problems. Score between 8-15:   moderate risk of alcohol related problems. Score between 16-19:  high risk of alcohol related problems. Score 20 or above:  warrants further diagnostic evaluation for alcohol dependence and treatment.  CLINICAL FACTORS:   Depression:   Anhedonia Hopelessness Impulsivity Insomnia  Musculoskeletal: Strength & Muscle Tone: within normal limits Gait & Station: normal Patient leans: N/A  Psychiatric Specialty Exam:  Presentation  General Appearance: Appropriate for Environment; Fairly Groomed  Eye Contact:Fair  Speech:Clear and Coherent  Speech Volume:Normal  Handedness:Right   Mood and Affect  Mood:Anxious  Affect:Congruent   Thought Process  Thought Processes:Coherent  Descriptions of Associations:Intact  Orientation:Full (Time, Place and Person)  Thought Content:Logical  History of Schizophrenia/Schizoaffective disorder:No data recorded Duration of Psychotic Symptoms:No data recorded Hallucinations:Hallucinations: None Description of Visual Hallucinations: Seeing specks of light  Ideas of Reference:None  Suicidal Thoughts:Suicidal Thoughts: No  Homicidal Thoughts:Homicidal Thoughts: No   Sensorium  Memory:Immediate Good  Judgment:Fair  Insight:Fair  Executive Functions  Concentration:Fair  Attention Span:Fair  Recall:Fair  Fund of Knowledge:Fair  Language:Fair  Psychomotor Activity  Psychomotor Activity:Psychomotor Activity: Normal  Assets  Assets:Communication Skills  Sleep  Sleep:Sleep: Poor Number of Hours of Sleep: 7   Physical Exam: Physical Exam Constitutional:      Appearance: Normal appearance.  HENT:     Head: Normocephalic.     Nose: Nose normal. No congestion or rhinorrhea.  Eyes:     Pupils: Pupils are equal, round, and reactive to light.  Pulmonary:     Effort: Pulmonary effort is normal.  Musculoskeletal:        General: Normal range of motion.     Cervical back: Normal range of motion.  Neurological:  Mental Status: She is alert and oriented to person, place, and time.     Sensory: No sensory deficit.    Review of Systems  Constitutional: Negative.   HENT: Negative.    Eyes: Negative.   Respiratory: Negative.    Cardiovascular: Negative.   Gastrointestinal: Negative.   Genitourinary: Negative.   Musculoskeletal: Negative.   Skin: Negative.   Neurological: Negative.   Psychiatric/Behavioral:  Positive for depression and substance abuse. Negative for hallucinations, memory loss and suicidal ideas. The patient is nervous/anxious and has insomnia.    Blood pressure 133/77, pulse 78, temperature 98.5 F (36.9 C), resp. rate 16, height 5\' 2"  (1.575 m), weight 66.2 kg, last menstrual period 05/17/2014, SpO2 99 %. Body mass index is 26.7 kg/m.  COGNITIVE FEATURES THAT CONTRIBUTE TO RISK:  None    SUICIDE RISK:   Mild:  Suicidal ideation of limited frequency, intensity, duration, and specificity.  There are no identifiable plans, no associated intent, mild dysphoria and related symptoms, good self-control (both objective and subjective assessment), few other risk factors, and identifiable protective factors, including available and accessible social support.  Treatment Plan Summary: Daily contact with patient to assess and evaluate symptoms and progress in treatment and Medication management   Observation Level/Precautions:  15 minute checks  Laboratory:  Labs reviewed   Psychotherapy:  Unit Group sessions  Medications:  See [facility]  Consultations:  To be determined   Discharge Concerns:  Safety, medication compliance, mood stability  Estimated LOS: 5-7 days  Other:  N/A    PLAN Safety and Monitoring: Voluntary admission to inpatient psychiatric unit for safety, stabilization and treatment Daily contact with patient to assess and evaluate symptoms and progress in treatment Patient's case to be discussed in multi-disciplinary team meeting Observation Level : q15 minute checks Vital  signs: q12 hours Precautions: Safety   Labs Reviewed: Toxicology screen +Benzos, +Cocaine. ETOH<10. Pt denies using Benzos. EKG with sinus rhythm and probably large atrial enlargement. Will repeat in a day/two. [facility]. TSH from 7/14 1.567, Hemoglobin A1C-5.7, Lipid panel from 7/14 with total cholesterol at 223 and LDL-112. Pt educated on the importance of healthy food choices and exercise. UA with moderate leukocytes and rare bacteria. Pt started on 500 mg duricef BID x 7 days for UTI while in the ED. Will continue this medication.   Long Term Goal(s): Improvement in symptoms so as ready for discharge   Short Term Goals: Ability to identify changes in lifestyle to reduce recurrence of condition will improve, Ability to verbalize feelings will improve, Ability to disclose and discuss suicidal ideas, Ability to identify and develop effective coping behaviors will improve, Compliance with prescribed medications will improve, and Ability to identify triggers associated with substance abuse/mental health issues will improve   Diagnoses:  Principal Problem:   MDD (major depressive disorder), recurrent severe, without psychosis (HCC) Active Problems:   Alcohol use disorder   Insomnia   MDD recurrent no psychosis-Restart home meds as follows: -Start Zoloft 100 mg daily    Insomnia -Start Trazodone 100 mg nightly PRN   Anxiety -Start Gabapentin 300 mg in the mornings -Start Gabapentin 600 mg nightly   CIWA < or =10 -Continue Hydroxyzine 25 mg every 6 hours PRN   CIWA > 10 -Continue Ativan 1mg  tabs every 6 hours PRN   UTI -Start Duricef 500 mg BID x 7 days (ends 9/05 @ 0800)   Other PRNS -Continue Tylenol 650 mg every 6 hours PRN for mild pain -Continue Maalox 30 mg every 4  hrs PRN for indigestion -Continue Imodium 2-4 mg as needed for diarrhea -Continue Milk of Magnesia as needed every 6 hrs for constipation -Continue Zofran disintegrating tabs every 6 hrs PRN for nausea     Discharge Planning: Social work and case management to assist with discharge planning and identification of hospital follow-up needs prior to discharge Estimated LOS: 5-7 days Discharge Concerns: Need to establish a safety plan; Medication compliance and effectiveness Discharge Goals: Return home with outpatient referrals for mental health follow-up including medication management/psychotherapy  I certify that inpatient services furnished can reasonably be expected to improve the patient's condition.   Starleen Blue, NP 05/07/2022, 3:49 PM

## 2022-05-08 ENCOUNTER — Encounter (HOSPITAL_COMMUNITY): Payer: Self-pay

## 2022-05-08 ENCOUNTER — Other Ambulatory Visit: Payer: Self-pay | Admitting: Nurse Practitioner

## 2022-05-08 DIAGNOSIS — F332 Major depressive disorder, recurrent severe without psychotic features: Secondary | ICD-10-CM | POA: Diagnosis not present

## 2022-05-08 LAB — URINE CULTURE: Culture: >=100000 — AB

## 2022-05-08 MED ORDER — NAPROXEN 500 MG PO TABS
500.0000 mg | ORAL_TABLET | Freq: Two times a day (BID) | ORAL | Status: DC | PRN
  Administered 2022-05-08 – 2022-05-10 (×5): 500 mg via ORAL
  Filled 2022-05-08 (×5): qty 1

## 2022-05-08 NOTE — Progress Notes (Signed)
Patient did not attend morning orientation/goal setting group, although she was made aware of it.  

## 2022-05-08 NOTE — Progress Notes (Signed)
Pt in day room this afternoon. Pt did not attend all groups, remains medication compliant. Pt requesting muscle relaxer for back pain, provider notified, new order noted for naproxen. Pt endorses anxiety this morning, as well as tingling to hands bilaterally. Pt denies other symptoms of withdrawal. Pt denies SI/HI/ self harm thoughts as well as a/v hallucinations.

## 2022-05-08 NOTE — [facility] Counselor (Signed)
CSW provided the Pt with a packet that contains information including shelter and housing resources, free and reduced price food information, clothing resources, crisis center information, a [employer] card, and suicide prevention information.   

## 2022-05-08 NOTE — Progress Notes (Signed)
Pts family brought in her medications in the original bottle Trazodone 100mg  q hs Cyclobenzaprine 10mg  q hs Gabapentin 600mg  q hs Flagyl 500mg  5 pills left in the bottle.  Bottles were placed in her locker.

## 2022-05-08 NOTE — Progress Notes (Addendum)
[facility]  05/08/2022 2:24 PM Theresa Rios  MRN:  [id number]  Reasons for admission:  Theresa Rios is a 56 yo [place] American female with a history of MDD, seizures & alcohol use d/o who [facility] with complaints of L groin pain & swelling & concerns for it being cancerous. CT scan revealed a large L sided inguinal hernia, antibiotics were ordered & pt was advised to f/u with general surgery, but [facility] requesting assistance with depressive symptoms & substance use. [facility] after a suicide attempt by overdosing on sleep aides.   24 hour chart review: V/S are WNL for the past 24 hrs. As per nursing flow sheets, pt slept for a total of 6 hrs last night. She is compliant with her scheduled medications, and required Trazodone 100 mg last night for insomnia, as well as Hydroxyzine earlier today morning for anxiety. Pt has not been attending unit group sessions.  Today's patient assessment note: Pt presents today with an anxious mood & affect is congruent. Attention to personal hygiene and grooming is fair, eye contact is good, speech is clear & coherent. Thought contents are organized and logical, and pt currently denies SI/HI/AVH or paranoia. There is no evidence of delusional thoughts.    Pt reports a good sleep quality last night, and reports a good appetite. She reports generalized body pain and states that she takes Flexeril at home for body pain. She demands for nursing staff to retrieve this medication from the unit lockers. Pt has been educated that this medication will only be retrieved for her at discharge, and we will not order this medication for her since we will not provide a prescription for it at discharge. Pt educated that Naproxen will be ordered PRN for pain, and is agreeable. We will continue medications as listed below. Pt requesting to talk to CSW regarding rehab for ETOH use. CSW will  be informed. Current tentative plan is to discharge patient, on Friday, 05/10/22.  Principal Problem: MDD (major depressive disorder), recurrent severe, without psychosis (HCC) Diagnosis: Principal Problem:   MDD (major depressive disorder), recurrent severe, without psychosis (HCC) Active Problems:   Alcohol use disorder   Insomnia  Total Time spent with patient: 30 minutes  Past Psychiatric History: as above  Past Medical History:  Past Medical History:  Diagnosis Date   Seizures (HCC)    History reviewed. No pertinent surgical history. Family History: History reviewed. No pertinent family history. Family Psychiatric  History: none reported Social History:  Social History   Substance and Sexual Activity  Alcohol Use Yes   Comment: 4-5 beers a day most days     Social History   Substance and Sexual Activity  Drug Use Yes   Types: Cocaine, Marijuana   Comment: last marijuana use: 2 mos; last cocaine use: 3 days ago    Social History   Socioeconomic History   Marital status: Media plannerDomestic Partner    Spouse name: Not on file   Number of children: 0   Years of education: Not on file   Highest education level: Not on file  Occupational History   Not on file  Tobacco Use   Smoking status: Every Day    Packs/day: 0.50    Types: Cigarettes   Smokeless tobacco: Never  Vaping Use   Vaping Use: Never used  Substance and Sexual Activity   Alcohol use: Yes  Comment: 4-5 beers a day most days   Drug use: Yes    Types: Cocaine, Marijuana    Comment: last marijuana use: 2 mos; last cocaine use: 3 days ago   Sexual activity: Not on file  Other Topics Concern   Not on file  Social History Narrative   Pt lives in [place] with a friend. Her husband is living with his mother. Pt recently lost her job and her place to stay.   Social Determinants of Health   Financial Resource Strain: Not on file  Food Insecurity: Not on file  Transportation Needs: Not on file  Physical  Activity: Not on file  Stress: Not on file  Social Connections: Not on file   Additional Social History:   Sleep: Good  Appetite:  Good  Current Medications: Current Facility-Administered Medications  Medication Dose Route Frequency Provider Last Rate Last Admin   acetaminophen (TYLENOL) tablet 650 mg  650 mg Oral Q6H PRN Ntuen, Jesusita Oka, FNP       alum & mag hydroxide-simeth (MAALOX/MYLANTA) 200-200-20 MG/5ML suspension 30 mL  30 mL Oral Q4H PRN Ntuen, Jesusita Oka, FNP       haloperidol (HALDOL) tablet 5 mg  5 mg Oral Q6H PRN Ntuen, Jesusita Oka, FNP       And   benztropine (COGENTIN) tablet 1 mg  1 mg Oral Q6H PRN Ntuen, Jesusita Oka, FNP       cefadroxil (DURICEF) capsule 500 mg  500 mg Oral BID Starleen Blue, NP   500 mg at 05/08/22 0741   gabapentin (NEURONTIN) capsule 300 mg  300 mg Oral Daily Starleen Blue, NP   300 mg at 05/08/22 0741   gabapentin (NEURONTIN) capsule 600 mg  600 mg Oral QHS Starleen Blue, NP   600 mg at 05/07/22 2114   hydrOXYzine (ATARAX) tablet 25 mg  25 mg Oral Q6H PRN Starleen Blue, NP   25 mg at 05/08/22 9628   loperamide (IMODIUM) capsule 2-4 mg  2-4 mg Oral PRN Starleen Blue, NP       LORazepam (ATIVAN) tablet 1 mg  1 mg Oral Q6H PRN Yesika Rispoli, NP       magnesium hydroxide (MILK OF MAGNESIA) suspension 30 mL  30 mL Oral Daily PRN Ntuen, Jesusita Oka, FNP       multivitamin with minerals tablet 1 tablet  1 tablet Oral Daily Starleen Blue, NP   1 tablet at 05/08/22 0742   naproxen (NAPROSYN) tablet 500 mg  500 mg Oral BID PRN Starleen Blue, NP       nicotine polacrilex (NICORETTE) gum 2 mg  2 mg Oral PRN Massengill, Harrold Donath, MD   2 mg at 05/08/22 0743   ondansetron (ZOFRAN-ODT) disintegrating tablet 4 mg  4 mg Oral Q6H PRN Starleen Blue, NP       sertraline (ZOLOFT) tablet 100 mg  100 mg Oral Daily Kirk Sampley, NP   100 mg at 05/08/22 3662   thiamine (VITAMIN B1) tablet 100 mg  100 mg Oral Daily Starleen Blue, NP   100 mg at 05/08/22 9476   traZODone (DESYREL)  tablet 100 mg  100 mg Oral QHS PRN Starleen Blue, NP   100 mg at 05/07/22 2114    Lab Results:  Results for orders placed or performed during the hospital encounter of 05/06/22 (from the past 48 hour(s))  Urinalysis, Complete w Microscopic Urine, Random     Status: Abnormal   Collection Time: 05/06/22  5:26 PM  Result Value Ref  Range   Color, Urine YELLOW YELLOW   APPearance CLEAR CLEAR   Specific Gravity, Urine 1.036 (H) 1.005 - 1.030   pH 6.0 5.0 - 8.0   Glucose, UA NEGATIVE NEGATIVE mg/dL   Hgb urine dipstick NEGATIVE NEGATIVE   Bilirubin Urine NEGATIVE NEGATIVE   Ketones, ur NEGATIVE NEGATIVE mg/dL   Protein, ur NEGATIVE NEGATIVE mg/dL   Nitrite NEGATIVE NEGATIVE   Leukocytes,Ua MODERATE (A) NEGATIVE   RBC / HPF 6-10 0 - 5 RBC/hpf   WBC, UA 21-50 0 - 5 WBC/hpf   Bacteria, UA RARE (A) NONE SEEN   Squamous Epithelial / LPF 0-5 0 - 5   Ca Oxalate Crys, UA PRESENT     Comment: Performed at [facility], [address], [place], [place] [address]  Rapid urine drug screen (hospital performed) not at [facility]     Status: Abnormal   Collection Time: 05/06/22  5:26 PM  Result Value Ref Range   Opiates NONE DETECTED NONE DETECTED   Cocaine POSITIVE (A) NONE DETECTED   Benzodiazepines POSITIVE (A) NONE DETECTED   Amphetamines NONE DETECTED NONE DETECTED   Tetrahydrocannabinol NONE DETECTED NONE DETECTED   Barbiturates NONE DETECTED NONE DETECTED    Comment: (NOTE) DRUG SCREEN FOR MEDICAL PURPOSES ONLY.  IF CONFIRMATION IS NEEDED FOR ANY PURPOSE, NOTIFY LAB WITHIN 5 DAYS.  LOWEST DETECTABLE LIMITS FOR URINE DRUG SCREEN Drug Class                     Cutoff (ng/mL) Amphetamine and metabolites    1000 Barbiturate and metabolites    200 Benzodiazepine                 200 Tricyclics and metabolites     300 Opiates and metabolites        300 Cocaine and metabolites        300 THC                            50 Performed at [facility], [address], [place], [place] [address]   CBC     Status: None   Collection Time: 05/07/22  6:46 AM  Result Value Ref Range   WBC 8.4 4.0 - 10.5 K/uL   RBC 4.55 3.87 - 5.11 MIL/uL   Hemoglobin 12.9 12.0 - 15.0 g/dL   HCT 64.3 32.9 - 51.8 %   MCV 90.1 80.0 - 100.0 fL   MCH 28.4 26.0 - 34.0 pg   MCHC 31.5 30.0 - 36.0 g/dL   RDW 84.1 66.0 - 63.0 %   Platelets 209 150 - 400 K/uL   nRBC 0.0 0.0 - 0.2 %    Comment: Performed at [facility], [address], [place], [place] [address]  Comprehensive metabolic panel     Status: Abnormal   Collection Time: 05/07/22  6:46 AM  Result Value Ref Range   Sodium 142 135 - 145 mmol/L    Comment: DELTA CHECK NOTED   Potassium 3.8 3.5 - 5.1 mmol/L   Chloride 109 98 - 111 mmol/L   CO2 27 22 - 32 mmol/L   Glucose, Bld 124 (H) 70 - 99 mg/dL    Comment: Glucose reference range applies only to samples taken after fasting for at least 8 hours.   BUN 8 6 - 20 mg/dL   Creatinine, Ser 9.32 0.44 - 1.00 mg/dL   Calcium 9.3 8.9 -  10.3 mg/dL   Total Protein 7.3 6.5 - 8.1 g/dL   Albumin 3.6 3.5 - 5.0 g/dL   AST 18 15 - 41 U/L   ALT 25 0 - 44 U/L   Alkaline Phosphatase 63 38 - 126 U/L   Total Bilirubin 0.4 0.3 - 1.2 mg/dL   GFR, Estimated >45 >80 mL/min    Comment: (NOTE) Calculated using the CKD-EPI Creatinine Equation (2021)    Anion gap 6 5 - 15    Comment: Performed at [facility], [address], [place], [place] [address]  TSH     Status: None   Collection Time: 05/07/22  6:46 AM  Result Value Ref Range   TSH 2.965 0.350 - 4.500 uIU/mL    Comment: Performed by a 3rd Generation assay with a functional sensitivity of <=0.01 uIU/mL. Performed at [facility], [address] [place], [place] [address]   Urinalysis, Complete w Microscopic Urine, Clean Catch     Status: Abnormal   Collection Time: 05/07/22 12:56 PM  Result Value Ref Range   Color, Urine YELLOW YELLOW   APPearance CLEAR CLEAR    Specific Gravity, Urine 1.011 1.005 - 1.030   pH 6.0 5.0 - 8.0   Glucose, UA NEGATIVE NEGATIVE mg/dL   Hgb urine dipstick NEGATIVE NEGATIVE   Bilirubin Urine NEGATIVE NEGATIVE   Ketones, ur NEGATIVE NEGATIVE mg/dL   Protein, ur NEGATIVE NEGATIVE mg/dL   Nitrite NEGATIVE NEGATIVE   Leukocytes,Ua MODERATE (A) NEGATIVE   RBC / HPF 0-5 0 - 5 RBC/hpf   WBC, UA 0-5 0 - 5 WBC/hpf   Bacteria, UA RARE (A) NONE SEEN   Squamous Epithelial / LPF 0-5 0 - 5    Comment: Performed at [facility], [address] [place], [place] [address]    Blood Alcohol level:  Lab Results  Component Value Date   [facility] <10 05/05/2022   ETH <10 04/11/2022    Metabolic Disorder Labs: Lab Results  Component Value Date   HGBA1C 5.7 (H) 03/22/2022   MPG 116.89 03/22/2022   No results found for: "PROLACTIN" Lab Results  Component Value Date   CHOL 223 (H) 03/22/2022   TRIG 102 03/22/2022   HDL 91 03/22/2022   CHOLHDL 2.5 03/22/2022   VLDL 20 03/22/2022   LDLCALC 112 (H) 03/22/2022    Physical Findings: AIMS:  , ,  ,  ,    CIWA:  CIWA-Ar Total: 4 COWS:     Musculoskeletal: Strength & Muscle Tone: within normal limits Gait & Station: normal Patient leans: N/A  Psychiatric Specialty Exam:  Presentation  General Appearance: Appropriate for Environment; Fairly Groomed  Eye Contact:Good  Speech:Clear and Coherent  Speech Volume:Normal  Handedness:Right   Mood and Affect  Mood:Euthymic  Affect:Congruent   Thought Process  Thought Processes:Linear  Descriptions of Associations:Intact  Orientation:Full (Time, Place and Person)  Thought Content:Logical  History of Schizophrenia/Schizoaffective disorder:No data recorded Duration of Psychotic Symptoms:No data recorded Hallucinations:Hallucinations: None  Ideas of Reference:None  Suicidal Thoughts:Suicidal Thoughts: No  Homicidal Thoughts:Homicidal Thoughts: No   Sensorium  Memory:Immediate  Good  Judgment:Fair  Insight:Fair  Executive Functions  Concentration:Good  Attention Span:Good  Recall:Good  Fund of Knowledge:Good  Language:Good  Psychomotor Activity  Psychomotor Activity:Psychomotor Activity: Normal  Assets  Assets:Communication Skills  Sleep  Sleep:Sleep: Good  Physical Exam: Physical Exam Constitutional:      Appearance: Normal appearance.  HENT:     Head: Normocephalic.     Nose: Nose  normal.  Eyes:     Pupils: Pupils are equal, round, and reactive to light.  Pulmonary:     Effort: Pulmonary effort is normal.  Musculoskeletal:        General: Normal range of motion.     Cervical back: Normal range of motion.  Neurological:     Mental Status: She is alert and oriented to person, place, and time.     Sensory: No sensory deficit.     Coordination: Coordination normal.  Psychiatric:        Behavior: Behavior normal.        Thought Content: Thought content normal.    Review of Systems  Constitutional: Negative.  Negative for fever.  HENT: Negative.    Eyes: Negative.   Respiratory: Negative.    Cardiovascular: Negative.   Gastrointestinal: Negative.   Genitourinary: Negative.   Musculoskeletal: Negative.   Skin: Negative.   Neurological: Negative.   Psychiatric/Behavioral:  Positive for depression and substance abuse. Negative for hallucinations, memory loss and suicidal ideas. The patient is nervous/anxious. The patient does not have insomnia.    Blood pressure 119/80, pulse 95, temperature 97.7 F (36.5 C), temperature source Oral, resp. rate 16, height  (1.575 m), weight 66.2 kg, last menstrual period 05/17/2014, SpO2 96 %. Body mass index is 26.7 kg/m. Treatment Plan Summary: Daily contact with patient to assess and evaluate symptoms and progress in treatment and Medication management   Observation Level/Precautions:  15 minute checks  Laboratory:  Labs reviewed   Psychotherapy:  Unit Group sessions  Medications:  See  [facility]  Consultations:  To be determined   Discharge Concerns:  Safety, medication compliance, mood stability  Estimated LOS: 5-7 days  Other:  N/A    PLAN Safety and Monitoring: Voluntary admission to inpatient psychiatric unit for safety, stabilization and treatment Daily contact with patient to assess and evaluate symptoms and progress in treatment Patient's case to be discussed in multi-disciplinary team meeting Observation Level : q15 minute checks Vital signs: q12 hours Precautions: Safety   Labs Reviewed: Toxicology screen +Benzos, +Cocaine. ETOH<10. Pt denies using Benzos. EKG with sinus rhythm and probably large atrial enlargement. Will repeat in a day/two. [id number]. TSH from 7/14 1.567, Hemoglobin A1C-5.7, Lipid panel from 7/14 with total cholesterol at 223 and LDL-112. Pt educated on the importance of healthy food choices and exercise. UA with moderate leukocytes and rare bacteria. Pt started on 500 mg duricef BID x 7 days for UTI while in the ED. Will continue this medication.   Long Term Goal(s): Improvement in symptoms so as ready for discharge   Short Term Goals: Ability to identify changes in lifestyle to reduce recurrence of condition will improve, Ability to verbalize feelings will improve, Ability to disclose and discuss suicidal ideas, Ability to identify and develop effective coping behaviors will improve, Compliance with prescribed medications will improve, and Ability to identify triggers associated with substance abuse/mental health issues will improve   Diagnoses:  Principal Problem:   MDD (major depressive disorder), recurrent severe, without psychosis (HCC) Active Problems:   Alcohol use disorder   Insomnia   MDD recurrent no psychosis- We are continuing home today, 05/08/2022 as follows meds as follows: -Continue Zoloft 100 mg daily    Insomnia -Continue Trazodone 100 mg nightly PRN   Anxiety -Continue Gabapentin 300 mg in the mornings -Continue Gabapentin  600 mg nightly  Pain -Start Naproxen 500 mg BID PRN   CIWA < or =10 -Continue Hydroxyzine 25 mg every 6 hours  PRN   CIWA > 10 -Continue Ativan 1mg  tabs every 6 hours PRN   UTI -Continue Duricef 500 mg BID x 7 days (ends 9/05 @ 0800)   Other PRNS -Continue Tylenol 650 mg every 6 hours PRN for mild pain -Continue Maalox 30 mg every 4 hrs PRN for indigestion -Continue Imodium 2-4 mg as needed for diarrhea -Continue Milk of Magnesia as needed every 6 hrs for constipation -Continue Zofran disintegrating tabs every 6 hrs PRN for nausea    Discharge Planning: Social work and case management to assist with discharge planning and identification of hospital follow-up needs prior to discharge Estimated LOS: 5-7 days Discharge Concerns: Need to establish a safety plan; Medication compliance and effectiveness Discharge Goals: Return home with outpatient referrals for mental health follow-up including medication management/psychotherapy   I certify that inpatient services furnished can reasonably be expected to improve the patient's condition.      11/05, NP 05/08/2022, 2:24 PM

## 2022-05-08 NOTE — Group Note (Signed)
LCSW Group Therapy Note   Group Date: 05/08/2022 Start Time: 1300 End Time: 1400  Type of Therapy and Topic:  Group Therapy:  Healthy and Unhealthy Supports  Participation Level:  None   Description of Group:  Patients in this group were introduced to the idea of adding a variety of healthy supports to address the various needs in their lives, especially in reference to their plans and focus for the new year.  Patients discussed what additional healthy supports could be helpful in their recovery and wellness after discharge in order to prevent future hospitalizations.   An emphasis was placed on using counselor, doctor, therapy groups, 12-step groups, and problem-specific support groups to expand supports.    Therapeutic Goals:   1)  discuss importance of adding supports to stay well once out of the hospital  2)  compare healthy versus unhealthy supports and identify some examples of each  3)  generate ideas and descriptions of healthy supports that can be added  4)  offer mutual support about how to address unhealthy supports  5)  encourage active participation in and adherence to discharge plan    Summary of Patient Progress:  Did not attend but sat outside of the group room.  She stated that she was waiting on a phone call.  The Pt did accept the worksheets from CSW.    Therapeutic Modalities:   Motivational Interviewing Brief Solution-Focused Therapy  Aram Beecham, [place] 05/08/2022  1:45 PM

## 2022-05-08 NOTE — BH IP Treatment Plan (Signed)
Interdisciplinary Treatment and Diagnostic Plan Update  05/08/2022 Time of Session: 9:35am  Theresa Rios MRN: [id number]  Principal Diagnosis: MDD (major depressive disorder), recurrent severe, without psychosis ([place])  Secondary Diagnoses: Principal Problem:   MDD (major depressive disorder), recurrent severe, without psychosis ([place]) Active Problems:   Alcohol use disorder   Insomnia   Current Medications:  Current Facility-Administered Medications  Medication Dose Route Frequency Provider Last Rate Last Admin   acetaminophen (TYLENOL) tablet 650 mg  650 mg Oral Q6H PRN Ntuen, Kris Hartmann, FNP       alum & mag hydroxide-simeth (MAALOX/MYLANTA) 200-200-20 MG/5ML suspension 30 mL  30 mL Oral Q4H PRN Ntuen, Kris Hartmann, FNP       haloperidol (HALDOL) tablet 5 mg  5 mg Oral Q6H PRN Ntuen, Kris Hartmann, FNP       And   benztropine (COGENTIN) tablet 1 mg  1 mg Oral Q6H PRN Ntuen, Kris Hartmann, FNP       cefadroxil (DURICEF) capsule 500 mg  500 mg Oral BID Nicholes Rough, NP   500 mg at 05/08/22 0741   gabapentin (NEURONTIN) capsule 300 mg  300 mg Oral Daily Nicholes Rough, NP   300 mg at 05/08/22 0741   gabapentin (NEURONTIN) capsule 600 mg  600 mg Oral QHS Nicholes Rough, NP   600 mg at 05/07/22 2114   hydrOXYzine (ATARAX) tablet 25 mg  25 mg Oral Q6H PRN Nicholes Rough, NP   25 mg at 05/08/22 0093   loperamide (IMODIUM) capsule 2-4 mg  2-4 mg Oral PRN Nicholes Rough, NP       LORazepam (ATIVAN) tablet 1 mg  1 mg Oral Q6H PRN Nkwenti, Doris, NP       magnesium hydroxide (MILK OF MAGNESIA) suspension 30 mL  30 mL Oral Daily PRN Ntuen, Kris Hartmann, FNP       multivitamin with minerals tablet 1 tablet  1 tablet Oral Daily Nicholes Rough, NP   1 tablet at 05/08/22 0742   naproxen (NAPROSYN) tablet 500 mg  500 mg Oral BID PRN Nicholes Rough, NP   500 mg at 05/08/22 1427   nicotine polacrilex (NICORETTE) gum 2 mg  2 mg Oral PRN Janine Limbo, MD   2 mg at 05/08/22 0743   ondansetron (ZOFRAN-ODT) disintegrating  tablet 4 mg  4 mg Oral Q6H PRN Nicholes Rough, NP       sertraline (ZOLOFT) tablet 100 mg  100 mg Oral Daily Nkwenti, Doris, NP   100 mg at 05/08/22 8182   thiamine (VITAMIN B1) tablet 100 mg  100 mg Oral Daily Nicholes Rough, NP   100 mg at 05/08/22 9937   traZODone (DESYREL) tablet 100 mg  100 mg Oral QHS PRN Nicholes Rough, NP   100 mg at 05/07/22 2114   PTA Medications: Medications Prior to Admission  Medication Sig Dispense Refill Last Dose   cefadroxil (DURICEF) 500 MG capsule Take 1 capsule (500 mg total) by mouth 2 (two) times daily. (Patient taking differently: Take 500 mg by mouth 2 (two) times daily. Take for 7 days starting on 05/06/22.) 14 capsule 0 not started yet   chlordiazePOXIDE (LIBRIUM) 25 MG capsule 63m PO TID x 1D, then 25-519mPO BID X 1D, then 25-502mO QD X 1D (Patient taking differently: Take 25-50 mg by mouth See admin instructions. Take two capsules three times daily for 1 day, then one to two capsules twice daily for 1 day and then one to two capsules daily for 1 day.)  10 capsule 0 not started yet   cyclobenzaprine (FLEXERIL) 10 MG tablet Take 10 mg by mouth at bedtime.   05/06/2022   gabapentin (NEURONTIN) 600 MG tablet Take 600 mg by mouth at bedtime.   05/05/2022   levETIRAcetam (KEPPRA) 1000 MG tablet Take 1 tablet (1,000 mg total) by mouth 2 (two) times daily. 60 tablet 3 05/05/2022   metroNIDAZOLE (FLAGYL) 500 MG tablet Take 500 mg by mouth See admin instructions. Take 1 tablet (500 mg total) by mouth 2 (two) times daily starting on 04/13/22. Please take this medication as prescribed for 5 more days and have your husband take it also as prescribed for 7 days.   05/05/2022   sertraline (ZOLOFT) 100 MG tablet Take 1 tablet (100 mg total) by mouth daily. (Patient taking differently: Take 100 mg by mouth at bedtime.) 30 tablet 1 05/05/2022   traZODone (DESYREL) 100 MG tablet Take 1 tablet (100 mg total) by mouth at bedtime as needed for sleep. 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   Physician Treatment Plan for Primary Diagnosis: MDD (major depressive disorder), recurrent severe, without psychosis ([place]) Long Term Goal(s): Improvement in symptoms so as ready for discharge   Short Term Goals: Ability to identify changes in lifestyle to reduce recurrence of condition will improve Ability to verbalize feelings will improve Ability to disclose and discuss suicidal ideas Ability to identify and develop effective coping behaviors will improve Compliance with prescribed medications will improve Ability to identify triggers associated with substance abuse/mental health issues will improve  Medication Management: Evaluate patient's response, side effects, and tolerance of medication regimen.  Therapeutic Interventions: 1 to 1 sessions, Unit Group sessions and Medication administration.  Evaluation of Outcomes: Not Met  Physician Treatment Plan for Secondary Diagnosis: Principal Problem:   MDD (major depressive disorder), recurrent severe, without psychosis ([place]) Active Problems:   Alcohol use disorder   Insomnia  Long Term Goal(s): Improvement in symptoms so as ready for discharge   Short Term Goals: Ability to identify changes in lifestyle to reduce recurrence of condition will improve Ability to verbalize feelings will improve Ability to disclose and discuss suicidal ideas Ability to identify and develop effective coping behaviors will improve Compliance with prescribed medications will improve Ability to identify triggers associated with substance abuse/mental health issues will improve      Medication Management: Evaluate patient's response, side effects, and tolerance of medication regimen.  Therapeutic Interventions: 1 to 1 sessions, Unit Group sessions and Medication administration.  Evaluation of Outcomes: Not Met   RN Treatment Plan for Primary Diagnosis: MDD (major depressive disorder), recurrent severe, without psychosis ([place]) Long Term Goal(s): Knowledge of disease and therapeutic regimen to maintain health will improve  Short Term Goals: Ability to remain free from injury will improve, Ability to participate in decision making will improve, Ability to verbalize feelings will improve, Ability to disclose and discuss suicidal ideas, and Ability to identify and develop effective coping behaviors will improve  Medication Management: RN will administer medications as ordered by provider, will assess and evaluate patient's response and provide education to patient for prescribed medication. RN will report any adverse and/or side effects to prescribing provider.  Therapeutic  Interventions: 1 on 1 counseling sessions, Psychoeducation, Medication administration, Evaluate responses to treatment, Monitor vital signs and CBGs as ordered, Perform/monitor CIWA, COWS, AIMS and Fall Risk screenings as ordered, Perform wound care treatments as ordered.  Evaluation of Outcomes: Not Met   LCSW Treatment Plan for Primary Diagnosis: MDD (major depressive disorder), recurrent severe, without psychosis ([place]) Long Term Goal(s): Safe transition to appropriate next level of care at discharge, Engage patient in therapeutic group addressing interpersonal concerns.  Short Term Goals: Engage patient in aftercare planning with referrals and resources, Increase social support, Increase emotional regulation, Facilitate acceptance of mental health diagnosis and concerns, Identify triggers associated with mental health/substance abuse issues, and Increase skills for wellness and recovery  Therapeutic  Interventions: Assess for all discharge needs, 1 to 1 time with Social worker, Explore available resources and support systems, Assess for adequacy in community support network, Educate family and significant other(s) on suicide prevention, Complete Psychosocial Assessment, Interpersonal group therapy.  Evaluation of Outcomes: Not Met   Progress in Treatment: Attending groups: No. Participating in groups: No. Taking medication as prescribed: Yes. Toleration medication: Yes. Family/Significant other contact made: Yes, individual(s) contacted:  Spouse Patient understands diagnosis: No. Discussing patient identified problems/goals with staff: Yes. Medical problems stabilized or resolved: Yes. Denies suicidal/homicidal ideation: Yes. Issues/concerns per patient self-inventory: No.   New problem(s) identified: No, Describe:  None   New Short Term/Long Term Goal(s): medication stabilization, elimination of SI thoughts, development of comprehensive mental wellness plan.   Patient Goals: "To go to a 30-day treatment center"  Discharge Plan or Barriers: Patient recently admitted. CSW will continue to follow and assess for appropriate referrals and possible discharge planning.   Reason for Continuation of Hospitalization: Anxiety Depression Medication stabilization Suicidal ideation  Estimated Length of Stay: 3 to 5 days   Last [place] Suicide Severity Risk Score: [place] Admission (Current) from OP Visit from 05/06/2022 in [facility] Most recent reading at 05/06/2022  5:15 PM ED from 05/06/2022 in [facility] DEPT Most recent reading at 05/06/2022  7:01 AM Admission (Discharged) from 03/21/2022 in [facility] Most recent reading at 03/21/2022  3:50 AM  C-SSRS RISK CATEGORY No Risk No Risk High Risk       Last PHQ 2/9 Scores:    04/03/2022    8:34 AM  Depression screen PHQ 2/9   Decreased Interest 0  Down, Depressed, Hopeless 0  PHQ - 2 Score 0    Scribe for Treatment Team: Darleen Crocker, Latanya Presser 05/08/2022 2:30 PM

## 2022-05-08 NOTE — Progress Notes (Signed)
The patient rated her day as a 10 out of 10 since she remained awake all day long. Her coping skill is to use more positive words.

## 2022-05-08 NOTE — Progress Notes (Signed)
Theresa Rios was irritable at the beginning of the shift.  she was much calmer when her belongings were brought in.  She denied SI/HI or AVH.  She took her hs medications without difficulty.  No complaints voiced.  She is currently resting with her eyes closed and appears to be asleep.    05/07/22 2114  Psych Admission Type (Psych Patients Only)  Admission Status Voluntary  Psychosocial Assessment  Patient Complaints Anxiety;Insomnia  Eye Contact Fair  Facial Expression Anxious  Affect Irritable  Speech Logical/coherent  Interaction Assertive  Motor Activity Slow  Appearance/Hygiene Unremarkable  Behavior Characteristics Irritable  Mood Irritable  Thought Process  Coherency WDL  Content WDL  Delusions None reported or observed  Perception WDL  Hallucination None reported or observed  Judgment Impaired  Confusion WDL  Danger to Self  Current suicidal ideation? Denies  Danger to Others  Danger to Others None reported or observed

## 2022-05-09 ENCOUNTER — Telehealth: Payer: Self-pay | Admitting: *Deleted

## 2022-05-09 DIAGNOSIS — F332 Major depressive disorder, recurrent severe without psychotic features: Secondary | ICD-10-CM

## 2022-05-09 NOTE — Telephone Encounter (Signed)
Post ED Visit - Positive Culture Follow-up  Culture report reviewed by antimicrobial stewardship pharmacist: [facility] []  [address] Pharm.D. []  [place], Pharm.D., BCPS AQ-ID []  , Pharm.D., BCPS []  Celedonio Miyamoto, .D., BCPS []  [place], .D., BCPS, AAHIVP []  Georgina Pillion, Pharm.D., BCPS, AAHIVP []  [address], PharmD, BCPS []  , PharmD, BCPS []  [place], PharmD, BCPS []  [place], PharmD []  , PharmD, BCPS []  Estella Husk, PharmD  Pharmacy Team []  Lysle Pearl, PharmD []  , PharmD []  Phillips Climes, PharmD []  , Rph []  Agapito Games) , PharmD []  Verlan Friends, PharmD []  , PharmD []  Mervyn Gay, PharmD []  , PharmD []  Vinnie Level, PharmD []  [facility], PharmD []  , PharmD []  Len Childs, PharmD   Positive urine culture Treated with Cefadroxil, organism sensitive to the same and no further patient follow-up is required at this time.  , PharmD  Greer Pickerel Talley 05/09/2022, 12:11 PM

## 2022-05-09 NOTE — [facility] Suicide Risk Assessment (Signed)
[facility] INPATIENT:  Family/Significant Other Suicide Prevention Education  Suicide Prevention Education:  Contact Attempts: Reece Levy [phone] (Spouse) has been identified by the patient as the family member/significant other with whom the patient will be residing, and identified as the person(s) who will aid the patient in the event of a mental health crisis.  With written consent from the patient, two attempts were made to provide suicide prevention education, prior to and/or following the patient's discharge.  We were unsuccessful in providing suicide prevention education.  A suicide education pamphlet was given to the patient to share with family/significant other.  Date and time of first attempt: 05/07/2022 at 2:15pm  Date and time of second attempt: 05/09/2022 at 11:01am   Aram Beecham 05/09/2022, 11:02 AM

## 2022-05-09 NOTE — Progress Notes (Signed)
     05/09/22 2032  Psych Admission Type (Psych Patients Only)  Admission Status Voluntary  Psychosocial Assessment  Patient Complaints Anxiety  Eye Contact Fair  Facial Expression Anxious  Affect Anxious  Speech Logical/coherent  Interaction Assertive;Attention-seeking;Needy  Motor Activity Fidgety  Appearance/Hygiene Unremarkable  Behavior Characteristics Cooperative;Anxious  Mood Pleasant;Anxious  Thought Process  Coherency WDL  Content WDL  Delusions None reported or observed  Perception WDL  Hallucination None reported or observed  Judgment Limited  Confusion WDL  Danger to Self  Current suicidal ideation? Denies  Agreement Not to Harm Self Yes  Description of Agreement verbal  Danger to Others  Danger to Others None reported or observed

## 2022-05-09 NOTE — [facility] Group Notes (Signed)
Adult Psychoeducational Group Note  Date:  05/09/2022 Time:  10:14 AM  Group Topic/Focus:  Goals Group:   The focus of this group is to help patients establish daily goals to achieve during treatment and discuss how the patient can incorporate goal setting into their daily lives to aide in recovery.  Participation Level:  Active  Participation Quality:  Appropriate  Affect:  Appropriate  Cognitive:  Appropriate  Insight: Appropriate  Engagement in Group:  Engaged  Modes of Intervention:  Discussion  Additional Comments:  Patient attended goals group and was attentive the duration of it. Patient's goal was to get a discharge plan.   Donell Sliwinski T Greysen Devino 05/09/2022, 10:14 AM

## 2022-05-09 NOTE — Progress Notes (Signed)
Patient appears pleasant and coopertative. Patient denies SI/HI. Pt reports she was "seeing trees fall into my room but then I realized it wasn't real". Pt reported 10/10 pain and was given medication per [facility] which was effective. Pt accidentally took the wrong folder and made another pt angry. Pt apologized for taking the wrong folder and asked for some medication for anxiety after this situation. Patient complied with morning medication with no reported side effects. Patient remains safe on Q68min checks and contracts for safety.       05/09/22 0933  Psych Admission Type (Psych Patients Only)  Admission Status Voluntary  Psychosocial Assessment  Patient Complaints Anxiety  Eye Contact Fair  Facial Expression Anxious  Affect Anxious  Speech Logical/coherent  Interaction Assertive;Attention-seeking;Needy  Motor Activity Fidgety  Appearance/Hygiene Unremarkable  Behavior Characteristics Cooperative;Anxious  Mood Anxious  Thought Process  Coherency WDL  Content WDL  Delusions None reported or observed  Perception WDL  Hallucination None reported or observed  Judgment Limited  Confusion WDL  Danger to Self  Current suicidal ideation? Denies  Agreement Not to Harm Self Yes  Description of Agreement verbal  Danger to Others  Danger to Others None reported or observed

## 2022-05-09 NOTE — [facility] Counselor (Signed)
CSW placed a referral for the Pt to [facility].  CSW spoke with Marcelino Duster at [facility] who states that the Pt will need to wait 14 days from the date of her last Seizure and remain on all of her medications before she can apply again.  Marcelino Duster states that the Pt may contact the agency again in 14 days to seek admission.  CSW will inform the Pt and the providers.

## 2022-05-09 NOTE — Progress Notes (Signed)
     05/08/22 2040  Psych Admission Type (Psych Patients Only)  Admission Status Voluntary  Psychosocial Assessment  Patient Complaints Anxiety  Eye Contact Fair  Facial Expression Anxious  Affect Anxious  Speech Logical/coherent  Interaction Assertive;Attention-seeking;Needy  Motor Activity Other (Comment) (WDL)  Appearance/Hygiene Unremarkable  Behavior Characteristics Cooperative  Mood Anxious  Thought Process  Coherency WDL  Content WDL  Delusions None reported or observed  Perception WDL  Hallucination None reported or observed  Judgment Limited  Confusion WDL  Danger to Self  Current suicidal ideation? Denies  Agreement Not to Harm Self Yes  Description of Agreement verbal  Danger to Others  Danger to Others None reported or observed

## 2022-05-09 NOTE — Plan of Care (Signed)
  Problem: Education: Goal: Emotional status will improve Outcome: Progressing Goal: Mental status will improve Outcome: Progressing   Problem: Activity: Goal: Interest or engagement in activities will improve Outcome: Progressing Goal: Sleeping patterns will improve Outcome: Progressing   Problem: Coping: Goal: Ability to verbalize frustrations and anger appropriately will improve Outcome: Progressing Goal: Ability to demonstrate self-control will improve Outcome: Progressing

## 2022-05-09 NOTE — [facility] Group Notes (Signed)
Scale 1-10 10-10 Goal: work on discharge tomorrow Accomplish: yes

## 2022-05-09 NOTE — Plan of Care (Signed)
  Problem: Education: Goal: Emotional status will improve Outcome: Progressing   Problem: Activity: Goal: Interest or engagement in activities will improve Outcome: Progressing   Problem: Education: Goal: Mental status will improve Outcome: Not Progressing   

## 2022-05-09 NOTE — Plan of Care (Signed)
  Problem: Education: Goal: Knowledge of [place] [facility] Education information/materials will improve Outcome: Progressing Goal: Emotional status will improve Outcome: Progressing Goal: Mental status will improve Outcome: Progressing Goal: Verbalization of understanding the information provided will improve Outcome: Progressing   Problem: Activity: Goal: Interest or engagement in activities will improve Outcome: Progressing Goal: Sleeping patterns will improve Outcome: Progressing   Problem: Coping: Goal: Ability to verbalize frustrations and anger appropriately will improve Outcome: Progressing Goal: Ability to demonstrate self-control will improve Outcome: Progressing   Problem: Health Behavior/Discharge Planning: Goal: Identification of resources available to assist in meeting health care needs will improve Outcome: Progressing Goal: Compliance with treatment plan for underlying cause of condition will improve Outcome: Progressing   Problem: Physical Regulation: Goal: Ability to maintain clinical measurements within normal limits will improve Outcome: Progressing   Problem: Safety: Goal: Periods of time without injury will increase Outcome: Progressing   Problem: Education: Goal: Ability to state activities that reduce stress will improve Outcome: Progressing   Problem: Coping: Goal: Ability to identify and develop effective coping behavior will improve Outcome: Progressing   Problem: Self-Concept: Goal: Ability to identify factors that promote anxiety will improve Outcome: Progressing Goal: Level of anxiety will decrease Outcome: Progressing Goal: Ability to modify response to factors that promote anxiety will improve Outcome: Progressing   Problem: Education: Goal: Knowledge of disease or condition will improve Outcome: Progressing Goal: Understanding of discharge needs will improve Outcome: Progressing   Problem: Health Behavior/Discharge  Planning: Goal: Ability to identify changes in lifestyle to reduce recurrence of condition will improve Outcome: Progressing Goal: Identification of resources available to assist in meeting health care needs will improve Outcome: Progressing   Problem: Physical Regulation: Goal: Complications related to the disease process, condition or treatment will be avoided or minimized Outcome: Progressing   Problem: Safety: Goal: Ability to remain free from injury will improve Outcome: Progressing   

## 2022-05-09 NOTE — Progress Notes (Addendum)
[facility] MD Progress Note  05/09/2022 11:48 AM DODIE PARISI  MRN:  [id number]  Reasons for admission:  Chisa L. Karch is a 56 yo [place] American female with a history of MDD, seizures & alcohol use d/o who initially presented to the [facility] with complaints of L groin pain & swelling & concerns for it being cancerous. CT scan revealed a large L sided inguinal hernia, antibiotics were ordered & pt was advised to f/u with general surgery, but she presented to this [facility] instead requesting assistance with depressive symptoms & substance use. Pt was last admitted to this [facility] on 03/21/2022 after a suicide attempt by overdosing on sleep aides.   24 hour chart review: The patient's chart was reviewed and nursing notes were reviewed. The patient's case was discussed in multidisciplinary team meeting. Per [facility], patient was taking medications appropriately. Per nursing, patient is calm and cooperative and attended 1 group session. CSW states that the patient may contact the [facility] again in 14 days to seek admission. The following PRN medications were given: atarax 3x, naproxen 3x, nicorette gum 3x, trazodone 1x    Today's patient assessment note:  The patient was seen and evaluated on the unit . On assessment, the patient feels "fine" today. Patient reports having good sleep and appetite with no issues on bowel movement, nausea, vomiting, and abdominal pain. She feels that the medications have been helpful and denies adverse effects. Patient feels the group session have been helpful. When asked about low mood, patient reports it has been improving. She notes anxiety regarding discharge but plans to be picked up by her friend tomorrow evening and she feels excited regarding her plan to stay with the friend. Denies SI, HI, AVH, delusions, paranoia, thought broadcasting, and ideas of reference.   Principal Problem: MDD (major depressive disorder), recurrent severe, without psychosis (HCC) Diagnosis: Principal Problem:    MDD (major depressive disorder), recurrent severe, without psychosis (HCC) Active Problems:   Alcohol use disorder   Insomnia  Total Time spent with patient: 30 minutes  Past Psychiatric History: see H&P  Past Medical History:  Past Medical History:  Diagnosis Date   Seizures (HCC)    History reviewed. No pertinent surgical history. Family History: History reviewed. No pertinent family history. Family Psychiatric  History: none reported Social History:  Social History   Substance and Sexual Activity  Alcohol Use Yes   Comment: 4-5 beers a day most days     Social History   Substance and Sexual Activity  Drug Use Yes   Types: Cocaine, Marijuana   Comment: last marijuana use: 2 mos; last cocaine use: 3 days ago    Social History   Socioeconomic History   Marital status: Media planner    Spouse name: Not on file   Number of children: 0   Years of education: Not on file   Highest education level: Not on file  Occupational History   Not on file  Tobacco Use   Smoking status: Every Day    Packs/day: 0.50    Types: Cigarettes   Smokeless tobacco: Never  Vaping Use   Vaping Use: Never used  Substance and Sexual Activity   Alcohol use: Yes    Comment: 4-5 beers a day most days   Drug use: Yes    Types: Cocaine, Marijuana    Comment: last marijuana use: 2 mos; last cocaine use: 3 days ago   Sexual activity: Not on file  Other Topics Concern   Not on file  Social History Narrative   Pt lives in [place] with a friend. Her husband is living with his mother. Pt recently lost her job and her place to stay.   Social Determinants of Health   Financial Resource Strain: Not on file  Food Insecurity: Not on file  Transportation Needs: Not on file  Physical Activity: Not on file  Stress: Not on file  Social Connections: Not on file   Sleep:  good  Appetite: good   Current Medications: Current Facility-Administered Medications  Medication Dose Route  Frequency Provider Last Rate Last Admin   acetaminophen (TYLENOL) tablet 650 mg  650 mg Oral Q6H PRN Ntuen, Jesusita Oka, FNP       alum & mag hydroxide-simeth (MAALOX/MYLANTA) 200-200-20 MG/5ML suspension 30 mL  30 mL Oral Q4H PRN Ntuen, Jesusita Oka, FNP       haloperidol (HALDOL) tablet 5 mg  5 mg Oral Q6H PRN Ntuen, Jesusita Oka, FNP       And   benztropine (COGENTIN) tablet 1 mg  1 mg Oral Q6H PRN Ntuen, Jesusita Oka, FNP       cefadroxil (DURICEF) capsule 500 mg  500 mg Oral BID Starleen Blue, NP   500 mg at 05/09/22 0749   gabapentin (NEURONTIN) capsule 300 mg  300 mg Oral Daily Starleen Blue, NP   300 mg at 05/09/22 0749   gabapentin (NEURONTIN) capsule 600 mg  600 mg Oral QHS Starleen Blue, NP   600 mg at 05/08/22 2040   hydrOXYzine (ATARAX) tablet 25 mg  25 mg Oral Q6H PRN Starleen Blue, NP   25 mg at 05/09/22 1021   loperamide (IMODIUM) capsule 2-4 mg  2-4 mg Oral PRN Starleen Blue, NP       LORazepam (ATIVAN) tablet 1 mg  1 mg Oral Q6H PRN Nkwenti, Doris, NP       magnesium hydroxide (MILK OF MAGNESIA) suspension 30 mL  30 mL Oral Daily PRN Ntuen, Jesusita Oka, FNP       multivitamin with minerals tablet 1 tablet  1 tablet Oral Daily Nkwenti, Doris, NP   1 tablet at 05/09/22 0750   naproxen (NAPROSYN) tablet 500 mg  500 mg Oral BID PRN Starleen Blue, NP   500 mg at 05/09/22 0254   nicotine polacrilex (NICORETTE) gum 2 mg  2 mg Oral PRN Massengill, Harrold Donath, MD   2 mg at 05/09/22 1132   ondansetron (ZOFRAN-ODT) disintegrating tablet 4 mg  4 mg Oral Q6H PRN Starleen Blue, NP       sertraline (ZOLOFT) tablet 100 mg  100 mg Oral Daily Nkwenti, Doris, NP   100 mg at 05/09/22 0750   thiamine (VITAMIN B1) tablet 100 mg  100 mg Oral Daily Starleen Blue, NP   100 mg at 05/09/22 0750   traZODone (DESYREL) tablet 100 mg  100 mg Oral QHS PRN Starleen Blue, NP   100 mg at 05/08/22 2040    Lab Results:  Results for orders placed or performed during the hospital encounter of 05/06/22 (from the past 48 hour(s))   Urinalysis, Complete w Microscopic Urine, Clean Catch     Status: Abnormal   Collection Time: 05/07/22 12:56 PM  Result Value Ref Range   Color, Urine YELLOW YELLOW   APPearance CLEAR CLEAR   Specific Gravity, Urine 1.011 1.005 - 1.030   pH 6.0 5.0 - 8.0   Glucose, UA NEGATIVE NEGATIVE mg/dL   Hgb urine dipstick NEGATIVE NEGATIVE   Bilirubin Urine NEGATIVE NEGATIVE   Ketones,  ur NEGATIVE NEGATIVE mg/dL   Protein, ur NEGATIVE NEGATIVE mg/dL   Nitrite NEGATIVE NEGATIVE   Leukocytes,Ua MODERATE (A) NEGATIVE   RBC / HPF 0-5 0 - 5 RBC/hpf   WBC, UA 0-5 0 - 5 WBC/hpf   Bacteria, UA RARE (A) NONE SEEN   Squamous Epithelial / LPF 0-5 0 - 5    Comment: Performed at [facility], [place] [address] [place],  [address]    Blood Alcohol level:  Lab Results  Component Value Date   ETH <10 05/05/2022   ETH <10 [id number]    Metabolic Disorder Labs: Lab Results  Component Value Date   HGBA1C 5.7 (H) 03/22/2022   MPG 116.89 03/22/2022   No results found for: "PROLACTIN" Lab Results  Component Value Date   CHOL 223 (H) 03/22/2022   TRIG 102 03/22/2022   HDL 91 03/22/2022   CHOLHDL 2.5 03/22/2022   VLDL 20 03/22/2022   LDLCALC 112 (H) 03/22/2022    Physical Findings: AIMS:  , ,  ,  ,    CIWA:  CIWA-Ar Total: 2 COWS:     Musculoskeletal: Strength & Muscle Tone: within normal limits Gait & Station: normal Patient leans: N/A  Psychiatric Specialty Exam:  General Appearance: appears at stated age, casually dressed and groomed  Behavior: pleasant and cooperative  Psychomotor Activity: no psychomotor agitation or retardation noted   Eye Contact: good Speech: normal amount, tone, volume and fluency   Mood: euthymic Affect: congruent, pleasant and interactive  Thought Process: linear, goal directed, no circumstantial or tangential thought process noted, no racing thoughts or flight of ideas Descriptions of Associations: intact Thought Content: no  bizarre content, logical and future-oriented Hallucinations: denies AH, VH , does not appear responding to stimuli Delusions: no paranoia, delusions of control, grandeur, ideas of reference, thought broadcasting, and magical thinking Suicidal Thoughts: denies SI, intention, plan  Homicidal Thoughts: denies HI, intention, plan   Alertness/Orientation: alert and fully oriented  Insight: fair, improved Judgment: fair, improved  Memory: intact  Executive Functions  Concentration: intact  Attention Span: fair Recall: intact Fund of Knowledge: fair  Physical Exam Constitutional:      Appearance: Normal appearance.  Cardiovascular:     Rate and Rhythm: Normal rate.  Pulmonary:     Effort: Pulmonary effort is normal.  Neurological:     General: No focal deficit present.     Mental Status: Alert and oriented to person, place, and time.    Review of Systems  Constitutional: Negative.  Negative for chills, fever and weight loss.  HENT: Negative.    Eyes: Negative.   Respiratory: Negative.    Cardiovascular: Negative.   Gastrointestinal:  Negative for constipation, diarrhea, nausea and vomiting.  Genitourinary: Negative.   Musculoskeletal: Negative.   Skin: Negative.   Neurological: Negative.  Negative for tingling.     Blood pressure 109/61, pulse 95, temperature 97.8 F (36.6 C), temperature source Oral, resp. rate 16, height 5\' 2"  (1.575 m), weight 66.2 kg, last menstrual period 05/17/2014, SpO2 99 %. Body mass index is 26.7 kg/m. Treatment Plan Summary: Daily contact with patient to assess and evaluate symptoms and progress in treatment and Medication management    PLAN Safety and Monitoring: Voluntary admission to inpatient psychiatric unit for safety, stabilization and treatment Daily contact with patient to assess and evaluate symptoms and progress in treatment Patient's case to be discussed in multi-disciplinary team meeting Observation Level : q15 minute  checks Vital signs: q12 hours Precautions: Safety  Labs Reviewed: Toxicology screen +Benzos, +Cocaine. ETOH<10. Pt denies using Benzos. EKG with sinus rhythm and probably large atrial enlargement. Will repeat in a day/two. [id number]. TSH from 7/14 1.567, Hemoglobin A1C-5.7, Lipid panel from 7/14 with total cholesterol at 223 and LDL-112. Pt educated on the importance of healthy food choices and exercise. UA with moderate leukocytes and rare bacteria. Pt started on 500 mg duricef BID x 7 days for UTI while in the ED. Will continue this medication.   Long Term Goal(s): Improvement in symptoms so as ready for discharge   Short Term Goals: Ability to identify changes in lifestyle to reduce recurrence of condition will improve, Ability to verbalize feelings will improve, Ability to disclose and discuss suicidal ideas, Ability to identify and develop effective coping behaviors will improve, Compliance with prescribed medications will improve, and Ability to identify triggers associated with substance abuse/mental health issues will improve   Diagnoses:  Principal Problem:   MDD (major depressive disorder), recurrent severe, without psychosis ([place]) Active Problems:   Alcohol use disorder   Insomnia   MDD recurrent no psychosis- We are continuing home tomorrow 9/1 as follows meds as follows: -Continue Zoloft 100 mg daily    Insomnia -Continue Trazodone 100 mg nightly PRN   Anxiety -Continue Gabapentin 300 mg in the mornings -Continue Gabapentin 600 mg nightly  Pain -continue Naproxen 500 mg BID PRN   CIWA < or =10 -Continue Hydroxyzine 25 mg every 6 hours PRN   CIWA > 10 -Continue Ativan 1mg  tabs every 6 hours PRN   UTI -Continue Duricef 500 mg BID x 7 days (ends 9/05 @ 0800)   Other PRNS -Continue Tylenol 650 mg every 6 hours PRN for mild pain -Continue Maalox 30 mg every 4 hrs PRN for indigestion -Continue Imodium 2-4 mg as needed for diarrhea -Continue Milk of Magnesia as needed  every 6 hrs for constipation -Continue Zofran disintegrating tabs every 6 hrs PRN for nausea    Discharge Planning: Social work and case management to assist with discharge planning and identification of hospital follow-up needs prior to discharge Estimated LOS: 5-7 days Discharge Concerns: Need to establish a safety plan; Medication compliance and effectiveness Discharge Goals: Return home with outpatient referrals for mental health follow-up including medication management/psychotherapy   I certify that inpatient services furnished can reasonably be expected to improve the patient's condition.      Stormy Fabian, MD 05/09/2022, 11:49 AM

## 2022-05-10 ENCOUNTER — Ambulatory Visit: Payer: Self-pay | Admitting: Nurse Practitioner

## 2022-05-10 DIAGNOSIS — F331 Major depressive disorder, recurrent, moderate: Secondary | ICD-10-CM | POA: Diagnosis not present

## 2022-05-10 DIAGNOSIS — F332 Major depressive disorder, recurrent severe without psychotic features: Secondary | ICD-10-CM | POA: Diagnosis present

## 2022-05-10 MED ORDER — SERTRALINE HCL 100 MG PO TABS: 100.0000 mg | ORAL_TABLET | Freq: Every day | ORAL | 0 refills | Status: DC

## 2022-05-10 MED ORDER — GABAPENTIN 300 MG PO CAPS: ORAL_CAPSULE | ORAL | 0 refills | Status: DC

## 2022-05-10 MED ORDER — CEFADROXIL 500 MG PO CAPS: 500.0000 mg | ORAL_CAPSULE | Freq: Two times a day (BID) | ORAL | 0 refills | Status: DC

## 2022-05-10 MED ORDER — NICOTINE POLACRILEX 2 MG MT GUM
2.0000 mg | CHEWING_GUM | OROMUCOSAL | 0 refills | Status: DC | PRN
Start: 2022-05-10 — End: 2022-08-02

## 2022-05-10 MED ORDER — TRAZODONE HCL 100 MG PO TABS: 100.0000 mg | ORAL_TABLET | Freq: Every evening | ORAL | 0 refills | Status: DC | PRN

## 2022-05-10 MED ORDER — HYDROXYZINE HCL 25 MG PO TABS
25.0000 mg | ORAL_TABLET | Freq: Four times a day (QID) | ORAL | 0 refills | Status: DC | PRN
Start: 2022-05-10 — End: 2022-08-02

## 2022-05-10 NOTE — B[facility] Group Notes (Signed)
Adult Psychoeducational Group Note  Date:  05/10/2022 Time:  9:30 AM  Group Topic/Focus:  Goals Group:   The focus of this group is to help patients establish daily goals to achieve during treatment and discuss how the patient can incorporate goal setting into their daily lives to aide in recovery.  Participation Level:  Active  Participation Quality:  Appropriate  Affect:  Appropriate  Cognitive:  Appropriate  Insight: Appropriate  Engagement in Group:  Engaged  Modes of Intervention:  Discussion  Additional Comments:  Patient 's goal for today is to be discharged. She said she has already discussed her discharged plan. She also named her coping skills she will used outside of the hospital.   Theresa Rios Theresa Rios 05/10/2022, 9:30 AM

## 2022-05-10 NOTE — Plan of Care (Signed)

## 2022-05-10 NOTE — Progress Notes (Signed)
Theresa Rios  D/C'd Home per MD order.  Discussed with the patient and all questions fully answered.  An After Visit Summary was printed and given to the patient. Patient received prescription paper to pick up her medication at the pharmacy of choice.   D/c education completed with patient including follow up instructions, medication list, d/c activities limitations if indicated, with other d/c instructions as indicated by MD - patient able to verbalize understanding, all questions fully answered.   Patient instructed to return to ED, call 911, or call MD for any changes in condition.   Patient escorted to the main entrance and D/C home via private auto.  Theresa Rios 05/10/2022 4:38 PM

## 2022-05-10 NOTE — Progress Notes (Signed)
  [facility] Adult Case Management Discharge Plan :  Will you be returning to the same living situation after discharge:  No.  Home with Friend  At discharge, do you have transportation home?: Yes,  Friend  Do you have the ability to pay for your medications: Yes,  Community Support   Release of information consent forms completed and in the chart;  Patient's signature needed at discharge.  Patient to Follow up at:  Follow-up Information     [facility]. Go to.   Specialty: Behavioral Health Why: Please go to this provider for an assessment, to obtain therapy and medication management services on Monday or Wednesday, arrive by 7:30 am.  Services are provided on a first come, first served basis. Contact information: [address] [place] [address] [phone]        [facility] Call.   Specialty: Addiction Medicine Why: A referral has been made for you.  Please contact this agency to complete an intake assessment and daily to check on available beds. Contact information: [address] [place] [address] [phone]         Services, [facility]. Call.   Why: You will need to take all of your medications and experience no seizure activity for 14 days.  Please contact this provider on 05/14/2022 to reapply for admission. Contact information: [address] [place] [address] [phone]                 Next level of care provider has access to [facility] Link:yes  Safety Planning and Suicide Prevention discussed: Yes,  with patient      Has patient been referred to the Quitline?: Patient refused referral  Patient has been referred for addiction treatment: Yes  Aram Beecham, LCSWA 05/10/2022, 11:01 AM

## 2022-05-10 NOTE — Discharge Summary (Signed)
Physician Discharge Summary Note  Patient:  Theresa Rios is an 56 y.o., female MRN:  [id number] DOB:  11/04/1965 Patient phone:  [phone] (home)  Patient address:   [address] [place] [address],  Total Time spent with patient: 30 minutes  Date of Admission:  05/06/2022  Date of Discharge: 05-10-22  Reason for Admission: Requested assistance with depressive symptoms & substance use.  Principal Problem: MDD (major depressive disorder), recurrent episode, moderate (HCC)  Discharge Diagnoses: Principal Problem:   MDD (major depressive disorder), recurrent episode, moderate (HCC) Active Problems:   Alcohol use disorder   Insomnia  Past Psychiatric History: See H&P.  Past Medical History:  Past Medical History:  Diagnosis Date   Seizures (HCC)    History reviewed. No pertinent surgical history.  Family History: History reviewed. No pertinent family history.  Family Psychiatric  History: See H&P  Social History:  Social History   Substance and Sexual Activity  Alcohol Use Yes   Comment: 4-5 beers a day most days     Social History   Substance and Sexual Activity  Drug Use Yes   Types: Cocaine, Marijuana   Comment: last marijuana use: 2 mos; last cocaine use: 3 days ago    Social History   Socioeconomic History   Marital status: Media planner    Spouse name: Not on file   Number of children: 0   Years of education: Not on file   Highest education level: Not on file  Occupational History   Not on file  Tobacco Use   Smoking status: Every Day    Packs/day: 0.50    Types: Cigarettes   Smokeless tobacco: Never  Vaping Use   Vaping Use: Never used  Substance and Sexual Activity   Alcohol use: Yes    Comment: 4-5 beers a day most days   Drug use: Yes    Types: Cocaine, Marijuana    Comment: last marijuana use: 2 mos; last cocaine use: 3 days ago   Sexual activity: Not on file  Other Topics Concern   Not on file  Social History  Narrative   Pt lives in [place] with a friend. Her husband is living with his mother. Pt recently lost her job and her place to stay.   Social Determinants of Health   Financial Resource Strain: Not on file  Food Insecurity: Not on file  Transportation Needs: Not on file  Physical Activity: Not on file  Stress: Not on file  Social Connections: Not on file   HOSPITAL COURSE: (See the admission evaluation notes): 56 yo [place] American female with a history of MDD, seizures & alcohol use d/o who initially presented to the [facility] with complaints of L groin pain & swelling & concerns for it being cancerous. CT scan revealed a large L sided inguinal hernia, antibiotics were ordered & pt was advised to f/u with general surgery, but she presented to this [facility] instead requesting assistance with depressive symptoms & substance use. Pt was last admitted to this [facility] on 03/21/2022 after a suicide attempt by overdosing on sleep aides.   Prior to this discharge, Theresa Rios was seen & evaluated for mental health stability. The current laboratory findings were reviewed (stable), nurses notes & vital signs were reviewed as well. There are no current mental health or medical issues that should prevent this discharge at this time. Patient is being discharged to continue mental health care as noted below.   This is the second psychiatric admissions/discharge  summaries from this [name] for this 56 year old AA female with hx of mental illness & substance use disorders. She was recently admitted in this hospital, treated, stabilized/discharged on medications with an outpatient psychiatric referral services to continue mental treatment after discharge. On that her first [facility] admission, she attempted suicide by overdose on Melatonin tablets. This time around, she presented to the [facility] seeking help for her mental health issues & substance abuse problems.   After her admission evaluation this time around, Theresa Rios's presenting symptoms were  noted. She was recommended for mood stabilization treatments. The medication regimen targeting those presenting symptoms were discussed & initiated with her consent. She was medicated, stabilized & discharged on the medications as listed on her discharge medication lists below. Besides the mood stabilization treatments, patient was was also enrolled & participated in the group counseling sessions being offered & held on this unit. She learned coping skills. She presented other significant pre-existing medical issues that required treatment. She was treated & discharged on the medications used for those medical issues. She tolerated her treatment regimen without any adverse effects or reactions reported. Theresa Rios's symptoms responded well to her treatment regimen warranting this discharge. Patient is also mentally/medically stable & agreeable to this discharge.  During the course of this this hospitalization, there were no instances of behavioral issues that required restraints or immediate intervention. Patient remained safe on the unit. There were no instances of self-harming behaviors noted or reported by staff. There were no threats to other patients/staff. Theresa Rios remained cooperative to her daily routines & in taking her treatment regimen as recommended by her treatment team. She participated in the group sessions and interacted with staff and other patients appropriately.   Over the course of this hospitalization, patient's symptoms responded well to her treatment regimen & her mood has improved. On this day of her hospital discharge, patient denies any thoughts of self-harm, suicidal/homicidal ideations. She denies any AVH, delusional thoughts or paranoia. She is not observed to be responding to internal any internal stimuli. She has been compliant with her recommended treatment regimen. She is currently showing readiness for discharge & is future-oriented thinking. Patient is encouraged to keep all psychiatric  appointments and continue with her medications as prescribed. She left [facility] with all personal belongings in no apparent distress. Transportation per her family friend.       Physical Findings: AIMS:  , ,  ,  ,    CIWA:  CIWA-Ar Total: 3 COWS:  n/a  Musculoskeletal: Strength & Muscle Tone: within normal limits Gait & Station: normal Patient leans: N/A  Psychiatric Specialty Exam:  Presentation  General Appearance: Appropriate for Environment; Casual; Fairly Groomed  Eye Contact:Good  Speech:Clear and Coherent; Normal Rate  Speech Volume:Normal  Handedness:Right  Mood and Affect  Mood:Euthymic  Affect:Appropriate; Congruent  Thought Process  Thought Processes:Coherent; Goal Directed; Linear  Descriptions of Associations:Intact  Orientation:Full (Time, Place and Person)  Thought Content:Logical  History of Schizophrenia/Schizoaffective disorder:No data recorded Duration of Psychotic Symptoms:No data recorded Hallucinations:Hallucinations: None Description of Visual Hallucinations: NA   Ideas of Reference:None  Suicidal Thoughts:Suicidal Thoughts: No   Homicidal Thoughts:Homicidal Thoughts: No   Sensorium  Memory:Immediate Good; Recent Good; Remote Good  Judgment:Good  Insight:Good; Present  Executive Functions  Concentration:Good  Attention Span:Good  [place]cer of Knowledge:Good  Language:Good  Psychomotor Activity  Psychomotor Activity:Psychomotor Activity: Normal   Assets  Assets:Communication Skills; Desire for Improvement; Housing; Physical Health; Financial Resources/Insurance; Resilience; Social Support  Sleep  Sleep:Sleep: Good  Number of Hours of Sleep: 6.5   Physical Exam: Physical Exam Constitutional:      Appearance: Normal appearance.  HENT:     Head: Normocephalic.     Nose: Nose normal.     Mouth/Throat:     Pharynx: Oropharynx is clear.  Eyes:     Pupils: Pupils are equal, round, and reactive to light.   Cardiovascular:     Rate and Rhythm: Normal rate.     Pulses: Normal pulses.  Pulmonary:     Effort: Pulmonary effort is normal.  Genitourinary:    Comments: Deferred Musculoskeletal:        General: Normal range of motion.     Cervical back: Normal range of motion.  Skin:    General: Skin is warm and dry.  Neurological:     General: No focal deficit present.     Mental Status: She is alert and oriented to person, place, and time. Mental status is at baseline.    Review of Systems  Constitutional:  Negative for chills, diaphoresis, fever and malaise/fatigue.  HENT: Negative.  Negative for congestion and sore throat.   Eyes:  Negative for blurred vision.  Respiratory:  Negative for cough, shortness of breath and wheezing.   Cardiovascular:  Negative for chest pain and palpitations.  Gastrointestinal:  Negative for abdominal pain, constipation, diarrhea, heartburn, nausea and vomiting.  Genitourinary:  Negative for dysuria.  Musculoskeletal:  Negative for joint pain and myalgias.  Skin:  Negative for itching and rash.  Neurological:  Negative for dizziness, tingling, tremors, sensory change, speech change, focal weakness, seizures, loss of consciousness, weakness and headaches.  Endo/Heme/Allergies:        Allergies: NKDA  Psychiatric/Behavioral:  Positive for depression (Hx of (stable on medication).) and substance abuse (Hx. Benzodiazepine & stimulant use disorder.). Negative for hallucinations, memory loss and suicidal ideas. The patient has insomnia (Hx of (stable on medication).). The patient is not nervous/anxious (Stable upon discharge.).    Blood pressure 93/71, pulse 100, temperature 97.8 F (36.6 C), temperature source Oral, resp. rate 16, height 5\' 2"  (1.575 m), weight 66.2 kg, last menstrual period 05/17/2014, SpO2 100 %. Body mass index is 26.7 kg/m.  Social History   Tobacco Use  Smoking Status Every Day   Packs/day: 0.50   Types: Cigarettes  Smokeless Tobacco  Never   Tobacco Cessation:  N/A, patient does not currently use tobacco products  Blood Alcohol level:  Lab Results  Component Value Date   ETH <10 05/05/2022   ETH <10 [id number]   Metabolic Disorder Labs:  Lab Results  Component Value Date   HGBA1C 5.7 (H) 03/22/2022   MPG 116.89 03/22/2022   No results found for: "PROLACTIN" Lab Results  Component Value Date   CHOL 223 (H) 03/22/2022   TRIG 102 03/22/2022   HDL 91 03/22/2022   CHOLHDL 2.5 03/22/2022   VLDL 20 03/22/2022   LDLCALC 112 (H) 03/22/2022   See Psychiatric Specialty Exam and Suicide Risk Assessment completed by Attending Physician prior to discharge.  Discharge destination:  Home  Is patient on multiple antipsychotic therapies at discharge:  No   Has Patient had three or more failed trials of antipsychotic monotherapy by history:  No  Recommended Plan for Multiple Antipsychotic Therapies: NA  Allergies as of 05/10/2022   No Known Allergies      Medication List     STOP taking these medications    chlordiazePOXIDE 25 MG capsule Commonly known as: LIBRIUM   cyclobenzaprine 10  MG tablet Commonly known as: FLEXERIL   gabapentin 600 MG tablet Commonly known as: NEURONTIN Replaced by: gabapentin 300 MG capsule   levETIRAcetam 1000 MG tablet Commonly known as: Keppra   metroNIDAZOLE 500 MG tablet Commonly known as: FLAGYL       TAKE these medications      Indication  cefadroxil 500 MG capsule Commonly known as: DURICEF Take 1 capsule (500 mg total) by mouth 2 (two) times daily. For infection What changed: additional instructions  Indication: Infection.   gabapentin 300 MG capsule Commonly known as: NEURONTIN Take 1 capsule (300 mg) po daily & 2 capsules (600 mg) po Q bedtime: For agitation. Start taking on: May 11, 2022 Replaces: gabapentin 600 MG tablet  Indication: Agitation   hydrOXYzine 25 MG tablet Commonly known as: ATARAX Take 1 tablet (25 mg total) by mouth every 6  (six) hours as needed. For anxiety  Indication: Feeling Anxious   nicotine polacrilex 2 MG gum Commonly known as: NICORETTE Take 1 each (2 mg total) by mouth as needed. (May buy from over the counter): For smoking cessation.  Indication: Nicotine Addiction   sertraline 100 MG tablet Commonly known as: ZOLOFT Take 1 tablet (100 mg total) by mouth daily. For depression What changed: additional instructions  Indication: Major Depressive Disorder   traZODone 100 MG tablet Commonly known as: DESYREL Take 1 tablet (100 mg total) by mouth at bedtime as needed for sleep. What changed:  when to take this reasons to take this  Indication: Trouble Sleeping         Follow-up Information     [facility]. Go to.   Specialty: Behavioral Health Why: Please go to this provider for an assessment, to obtain therapy and medication management services on Monday or Wednesday, arrive by 7:30 am.  Services are provided on a first come, first served basis. Contact information: [address] [place] [address] [phone]        [facility] Call.   Specialty: Addiction Medicine Why: A referral has been made for you.  Please contact this agency to complete an intake assessment and daily to check on available beds. Contact information: [address] [place] [address] [phone]         Services, [facility]. Call.   Why: You will need to take all of your medications and experience no seizure activity for 14 days.  Please contact this provider on 05/14/2022 to reapply for admission. Contact information: [address] [place] [address] [phone]                Follow-up recommendations: Activity:  As tolerated Diet: As recommended by your primary care doctor. Keep all scheduled follow-up appointments as recommended.    Comments: Comments: Patient is recommended to follow-up care  on an outpatient basis as noted above. Prescriptions sent to pt's pharmacy of choice at discharge.   Patient agreeable to plan.   Given opportunity to ask questions.   Appears to feel comfortable with discharge denies any current suicidal or homicidal thought. Patient is also instructed prior to discharge to: Take all medications as prescribed by his/her mental healthcare provider. Report any adverse effects and or reactions from the medicines to his/her outpatient provider promptly. Patient has been instructed & cautioned: To not engage in alcohol and or illegal drug use while on prescription medicines. In the event of worsening symptoms, patient is instructed to call the crisis hotline, 911  and or go to the nearest ED for appropriate evaluation and treatment of symptoms. To follow-up with his/her primary care provider for your other medical issues, concerns and or health care needs.   Signed: Lindell Spar, NP, pmhnp, fnp-bc. 05/10/2022, 3:39 PM

## 2022-05-10 NOTE — Group Note (Signed)
LCSW Group Therapy Note   Group Date: 05/10/2022 Start Time: 1300 End Time: 1400  Type of Therapy and Topic: Group Therapy: Anger Management   Participation Level:  Did Not Attend  Description of Group: In this group, patients will learn helpful strategies and techniques to manage anger, express anger in alternative ways, change hostile attitudes, and prevent aggressive acts, such as verbal abuse and violence.This group will be process-oriented and eductional, with patients participating in exploration of their own experiences as well as giving and receiving support and challenge from other group members.  Therapeutic Goals: Patient will learn to manage anger. Patient will learn to stop violence or the threat of violence. Patient will learn to develop self control over thoughts and actions. Patient will receive support and feedback from others  Summary of Patient Progress:  Did not attend    Therapeutic Modalities: Cognitive Behavioral Therapy Solution Focused Therapy Motivational Interviewing  Aram Beecham, Theresia Majors 05/10/2022  1:41 PM

## 2022-05-10 NOTE — [facility]HH Suicide Risk Assessment (Signed)
Suicide Risk Assessment  Discharge Assessment    [facility] Discharge Suicide Risk Assessment   Principal Problem: MDD (major depressive disorder), recurrent episode, moderate (HCC)  Discharge Diagnoses: Principal Problem:   MDD (major depressive disorder), recurrent episode, moderate (HCC) Active Problems:   Alcohol use disorder   Insomnia  Total Time spent with patient:  Greater than 30 minutes  Musculoskeletal: Strength & Muscle Tone: within normal limits Gait & Station: normal Patient leans: N/A  Psychiatric Specialty Exam  Presentation  General Appearance: Appropriate for Environment; Casual; Fairly Groomed  Eye Contact:Good  Speech:Clear and Coherent; Normal Rate  Speech Volume:Normal  Handedness:Right   Mood and Affect  Mood:Euthymic  Duration of Depression Symptoms: No data recorded Affect:Appropriate; Congruent   Thought Process  Thought Processes:Coherent; Goal Directed; Linear  Descriptions of Associations:Intact  Orientation:Full (Time, Place and Person)  Thought Content:Logical  History of Schizophrenia/Schizoaffective disorder:No data recorded Duration of Psychotic Symptoms:No data recorded Hallucinations:Hallucinations: None Description of Visual Hallucinations: NA  Ideas of Reference:None  Suicidal Thoughts:Suicidal Thoughts: No  Homicidal Thoughts:Homicidal Thoughts: No   Sensorium  Memory:Immediate Good; Recent Good; Remote Good  Judgment:Good  Insight:Good; Present   Executive Functions  Concentration:Good  Attention Span:Good  Recall:Good  Fund of Knowledge:Good  Language:Good   Psychomotor Activity  Psychomotor Activity:Psychomotor Activity: Normal  Assets  Assets:Communication Skills; Desire for Improvement; Housing; Physical Health; Financial Resources/Insurance; Resilience; Social Support   Sleep  Sleep:Sleep: Good Number of Hours of Sleep: 6.5  Physical Exam:  Blood pressure 93/71, pulse 100, temperature  97.8 F (36.6 C), temperature source Oral, resp. rate 16, height 5\' 2"  (1.575 m), weight 66.2 kg, last menstrual period 05/17/2014, SpO2 100 %. Body mass index is 26.7 kg/m.  Mental Status Per Nursing Assessment::   On Admission:  NA  Demographic Factors:  Adolescent or young adult and Low socioeconomic status  Loss Factors: Financial problems/change in socioeconomic status and NA  Historical Factors: Impulsivity  Risk Reduction Factors:   Religious beliefs about death, Living with another person, especially a relative, Positive social support, Positive therapeutic relationship, and Positive coping skills or problem solving skills  Continued Clinical Symptoms:  Depression:   Hopelessness Impulsivity Insomnia Alcohol/Substance Abuse/Dependencies More than one psychiatric diagnosis Previous Psychiatric Diagnoses and Treatments  Cognitive Features That Contribute To Risk:  Closed-mindedness, Polarized thinking, and Thought constriction (tunnel vision)    Suicide Risk:  Minimal: No identifiable suicidal ideation.  Patients presenting with no risk factors but with morbid ruminations; may be classified as minimal risk based on the severity of the depressive symptoms   Follow-up Information     [facility]. Go to.   Specialty: Behavioral Health Why: Please go to this provider for an assessment, to obtain therapy and medication management services on Monday or Wednesday, arrive by 7:30 am.  Services are provided on a first come, first served basis. Contact information: [address] [place] [phone]        [facility] Call.   Specialty: Addiction Medicine Why: A referral has been made for you.  Please contact this agency to complete an intake assessment and daily to check on available beds. Contact information: [address] [place] [phone]         Services,  [facility]. Call.   Why: You will need to take all of your medications and experience no seizure activity for 14 days.  Please contact this provider on 05/14/2022 to reapply for admission. Contact information:  [address] [place] [address] [phone]                Plan Of Care/Follow-up recommendations:  See the discharge recommendations above.  Armandina Stammer, NP, pmhnp, fnp-bc. 05/10/2022, 9:49 AM

## 2022-06-12 ENCOUNTER — Ambulatory Visit (INDEPENDENT_AMBULATORY_CARE_PROVIDER_SITE_OTHER): Payer: Self-pay | Admitting: Primary Care

## 2022-06-24 ENCOUNTER — Ambulatory Visit: Payer: Self-pay | Admitting: Family Medicine

## 2022-07-03 IMAGING — CT CT HEAD W/O CM
4 of 7 series · 14 of 47 positions shown, 16 images · non-contrast
Comparison: Cervical spine CT 11/03/2020, head CT 11/03/2020.

CLINICAL DATA: New onset seizures, with head and neck trauma.



[Series 4: head wo · axial · 0.40mm/px · z∈[+1233,+1348]mm · 6 of 33 slices shown, 8 images (1 of 2)]
[im 5/33  brain]
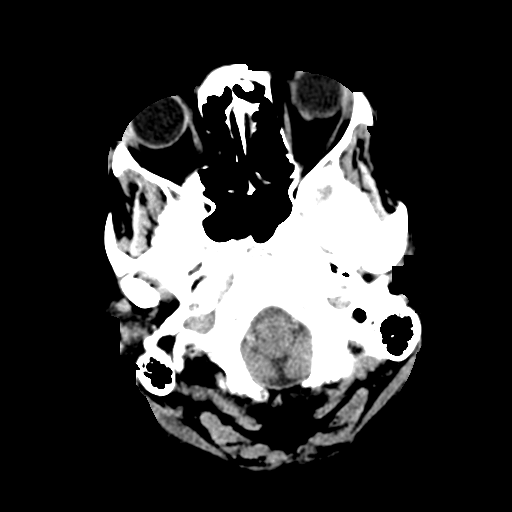
[im 5/33  bone]
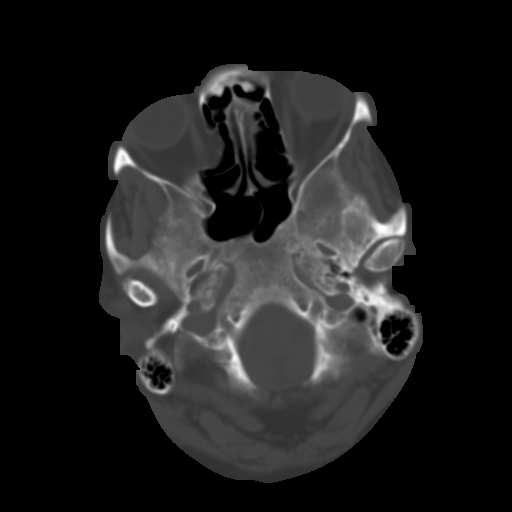
[im 10/33  brain]
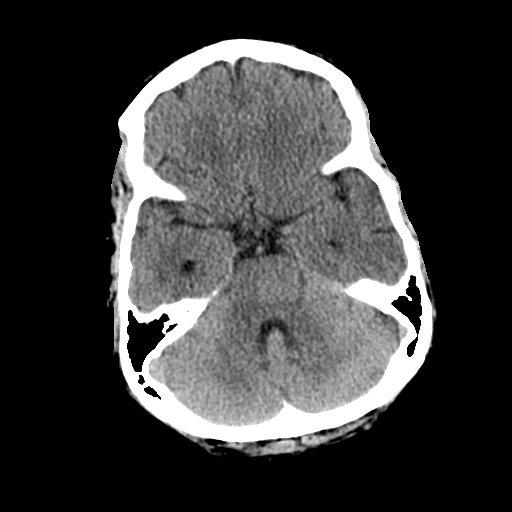
[im 14/33  brain]
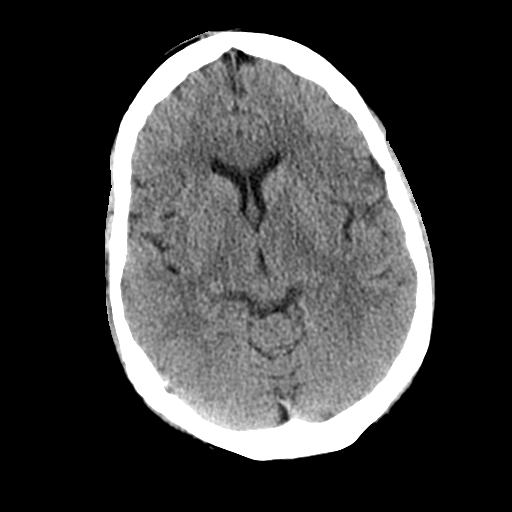
[im 19/33  brain]
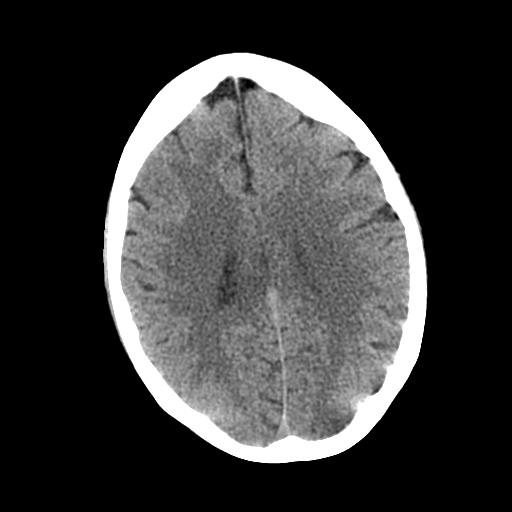
[im 23/33  brain]
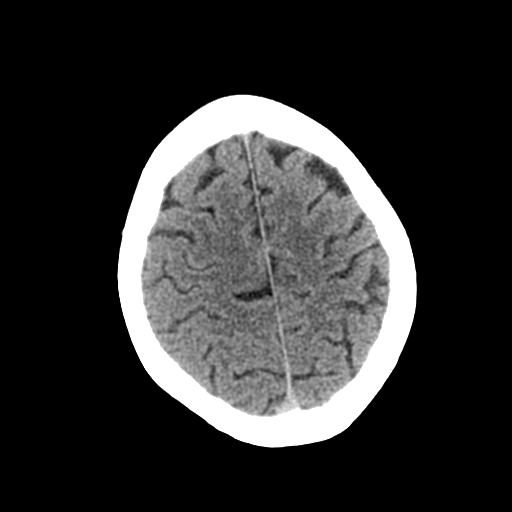
[im 23/33  bone]
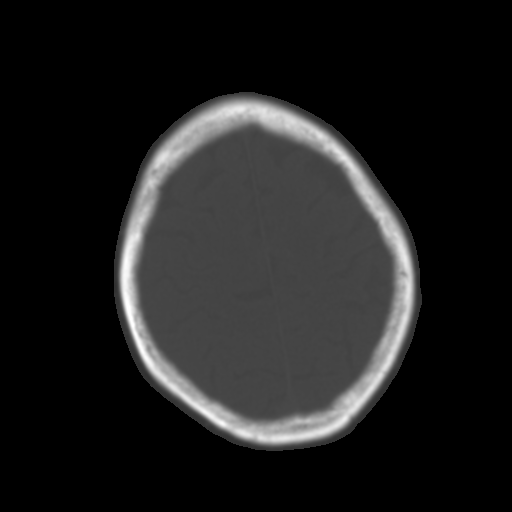
[im 28/33  brain]
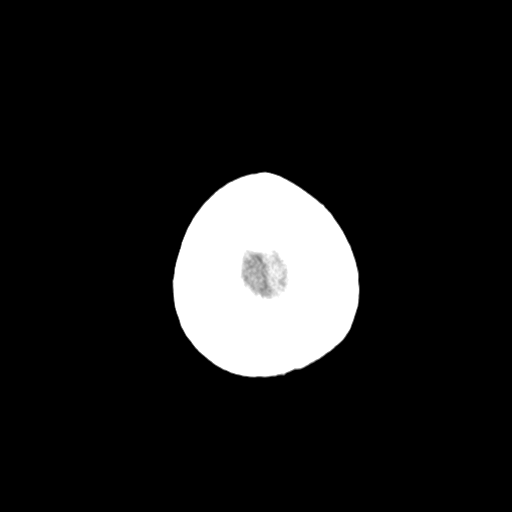

[Series 8: head wo · axial · 0.40mm/px · z∈[+1233,+1278]mm · 3 of 33 slices shown (2 of 2)]
[im 5/33  brain]
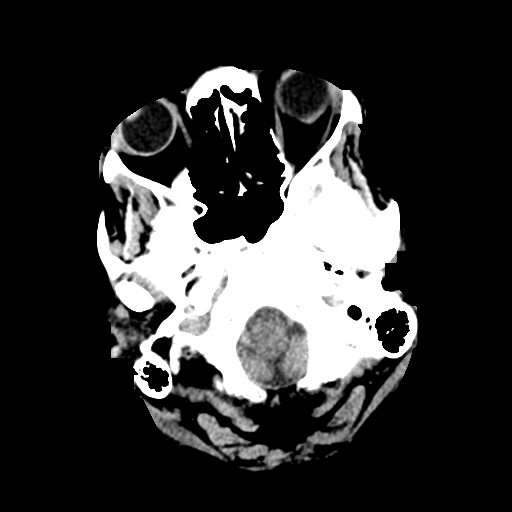
[im 10/33  brain]
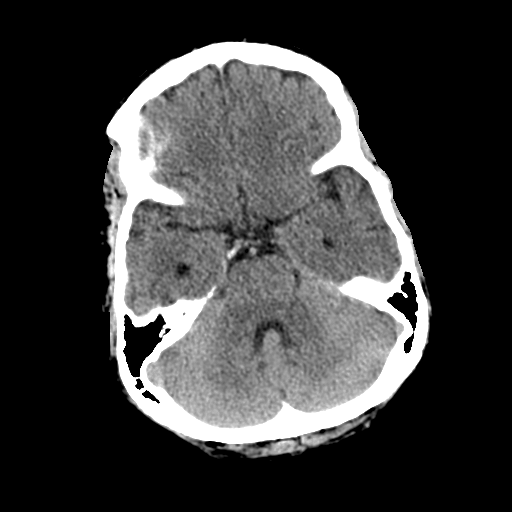
[im 14/33  brain]
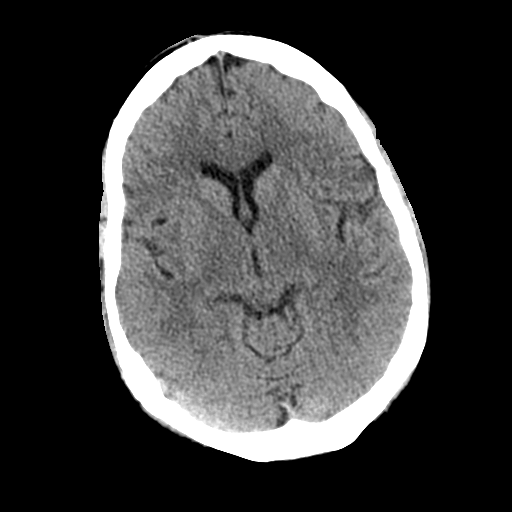

[Series 11: coronal soft tissue · coronal · 0.33mm/px · 3 of 69 slices shown]
[im 18/69  brain]
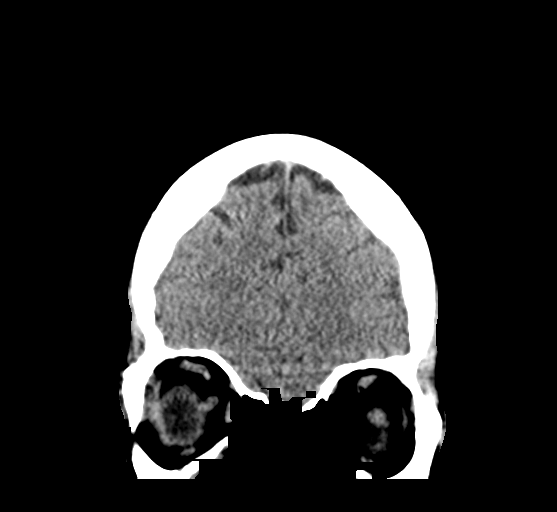
[im 35/69  brain]
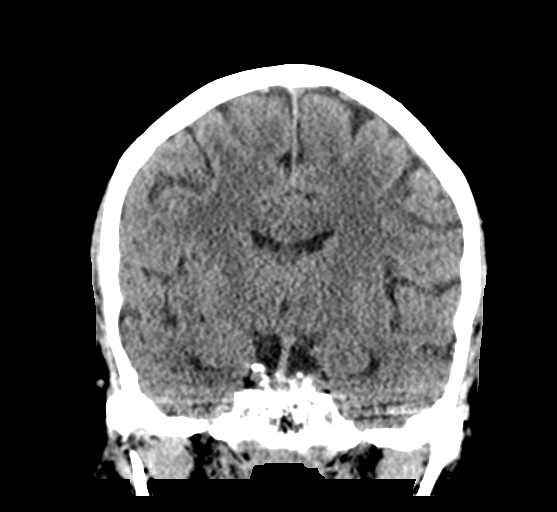
[im 52/69  brain]
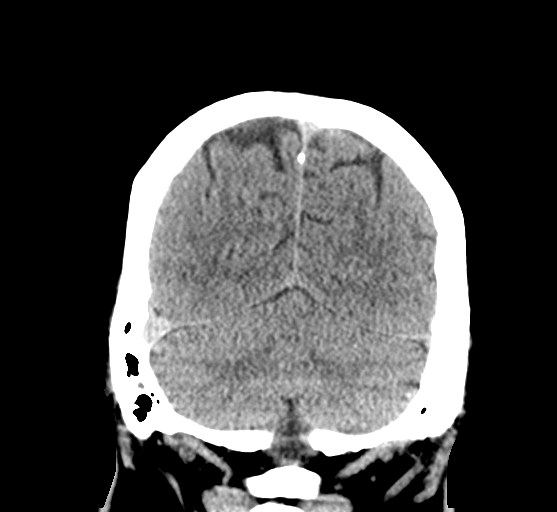

[Series 12: sagittal soft tissue · sagittal · 0.32mm/px · 2 of 55 slices shown]
[im 19/55  brain]
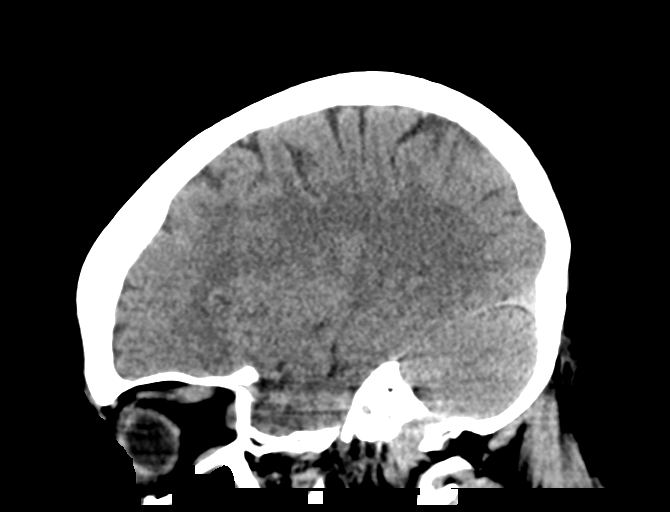
[im 37/55  brain]
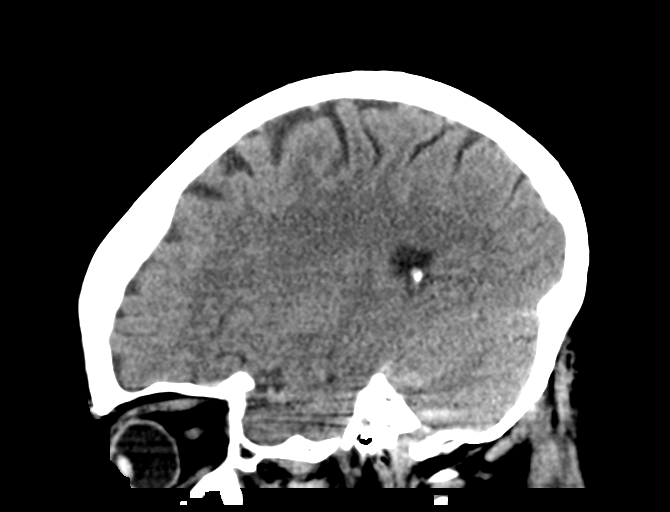

[14 of 47 positions shown; findings below may reference images not displayed]

FINDINGS: CT HEAD FINDINGS

Brain: No evidence of acute infarction, hemorrhage, hydrocephalus,
extra-axial collection or mass lesion/mass effect.

Vascular: There calcifications at both carotid siphons but no
hyperdense central vessels.

Skull: Normal. Negative for fracture or focal lesion.

Sinuses/Orbits: Chronic right maxillary sinus mucosal thickening
with small air-fluid level is unchanged. There is increased left
maxillary sinus concentric membrane disease and scattered opacity in
the ethmoids.

The frontal and sphenoid sinus and mastoid air cells are clear. A
chronic depressed fracture of the medial wall right orbit is again
noted chronic fracture deformity left zygomatic arch. Findings are
unchanged.

Other: None.

CT CERVICAL SPINE FINDINGS

Factors affecting image quality: Patient motion artifact. Image
acquisition was attempted x2 with multilevel motion degradation on
both attempts.

Alignment: Reversed cervical lordosis again noted and similar to the
previous exam. There is no AP listhesis or scoliosis.

The facet joints are normally aligned but there is up to 3 mm new
widening of the left C3-4 facet joint, with facet hypertrophy
disproportionately at this level unclear whether this is due to a
left facet joint effusion or left-sided ligamentous injury.

There are no joint surface erosions visible. The prior study
demonstrated asymmetric facet hypertrophy at this level as well but
no joint space widening.

The other facet joints show lesser degenerative features without
widening.

Skull base and vertebrae: No acute fracture. No primary bone lesion
or focal pathologic process.

Soft tissues and spinal canal: No prevertebral fluid or swelling. No
visible canal hematoma. There are mild calcifications in the
proximal cervical ICA.

Disc levels: Moderately advanced degenerative discopathy and
bidirectional osteophytes are again noted C4-5 through C7-T1
associated with ventral cord surface flattening and deformity and
spinal canal stenosis which is most significant at C4-5, C5-6 and
C7-T1, where there is suspected mild spondylotic cord compression.
This was seen previously.

At C1-2, osteophytes and narrowing of the anterior atlantodental
joint appear similar to the previous exam. The C2-3 and C3-4 discs
are normal in heights.

As above there is mild facet spurring except at the left C3-4 facet
joint where the osteophytes are more prominent.

Additional uncinate spurring is seen with foraminal stenosis which
is moderate on the left at C3-4, moderate to severe on the right at
C4-5, bilaterally moderate to severe C5-6, bilaterally moderate at
C6-7, mild-to-moderate on the left at C7-T1.

Upper chest: Negative.

Other: None.
IMPRESSION: 1. No acute intracranial CT findings, depressed skull fractures or
interval changes.
2. Chronic membrane thickening and fluid right maxillary sinus, new
moderate membrane thickening left maxillary sinus common bilateral
ethmoids.
3. Old right lamina papyracea depressed fracture.
4. No evidence of cervical spine fractures or listhesis.
5. Up to 3 mm new widening of the left C3-4 facet joint. There is
disproportionately prominent facet hypertrophy of this joint. The
findings could be due to a new joint effusion or ligamentous injury.
No joint surface erosion is seen.
6. Degenerative changes with 3 levels with posterior disc osteophyte
complexes moderately encroaching on the cord surface. Multilevel
foraminal stenosis. This was seen previously.

## 2022-07-03 IMAGING — CT CT CERVICAL SPINE W/O CM
4 of 8 series · 12 of 33 positions shown, 13 images · non-contrast
Comparison: Cervical spine CT 11/03/2020, head CT 11/03/2020.

CLINICAL DATA: New onset seizures, with head and neck trauma.



[Series 6: orthogonal bone · axial · 0.23mm/px · z∈[+1126,+1182]mm · 2 of 97 slices shown, 3 images (1 of 2)]
[im 33/97  soft-tissue]
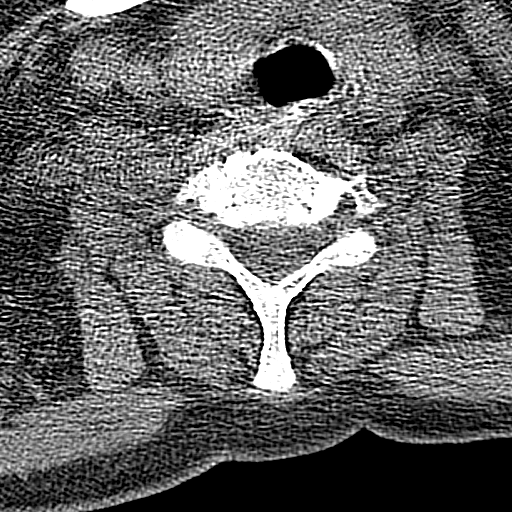
[im 33/97  bone]
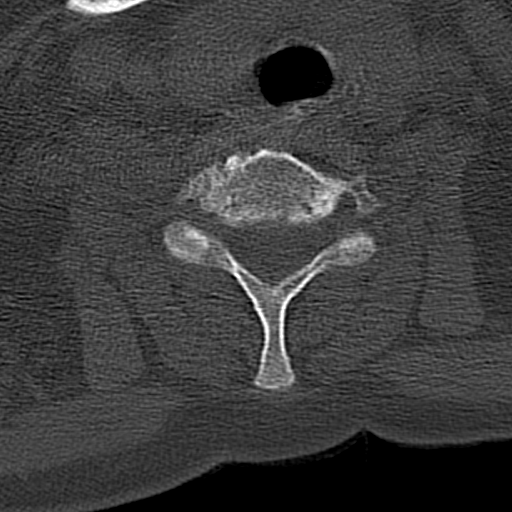
[im 65/97  bone]
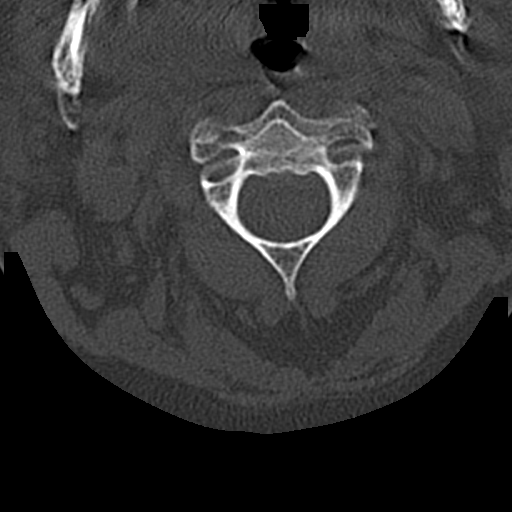

[Series 7: coronal bone · coronal · 0.26mm/px · 3 of 65 slices shown]
[im 7/65  bone]
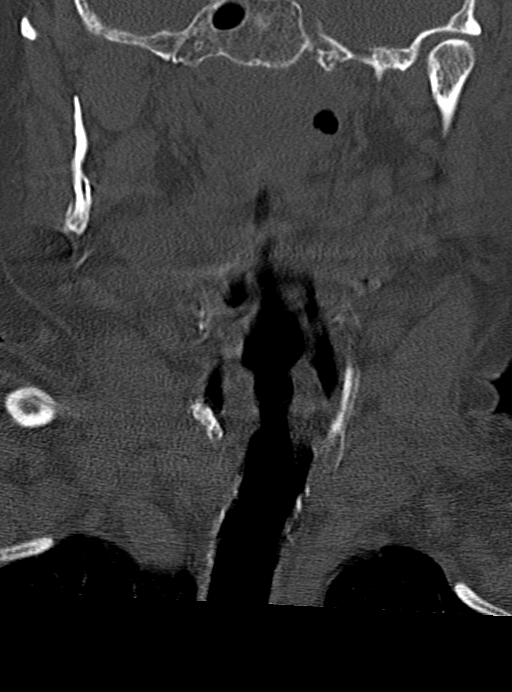
[im 33/65  bone]
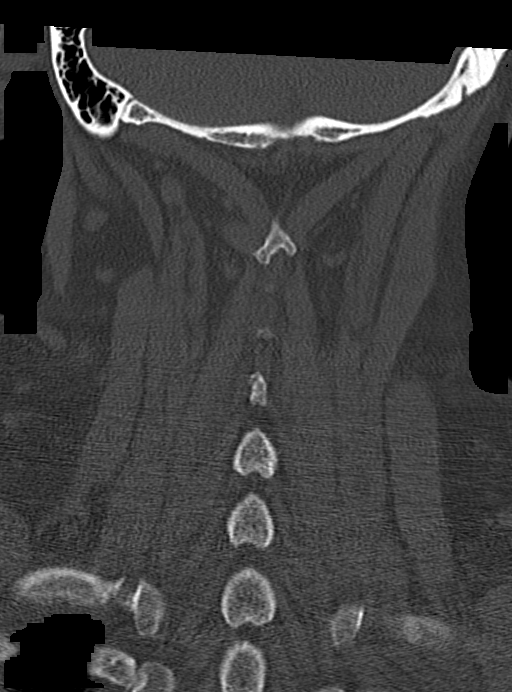
[im 60/65  bone]
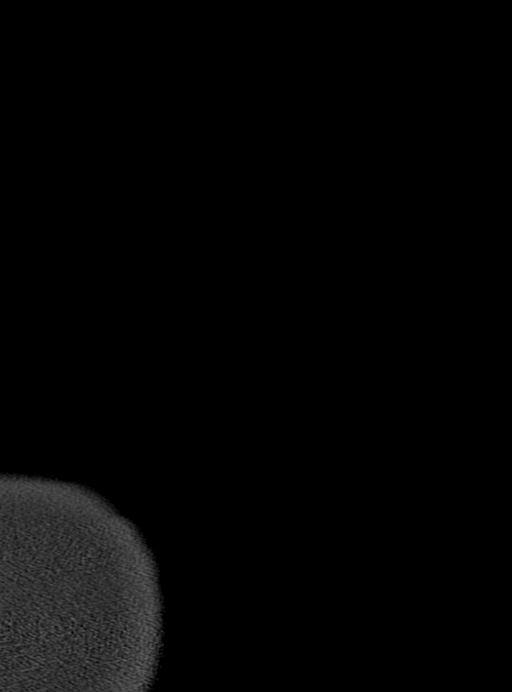

[Series 8: sagittal bone · sagittal · 0.32mm/px · 5 of 65 slices shown]
[im 11/65  bone]
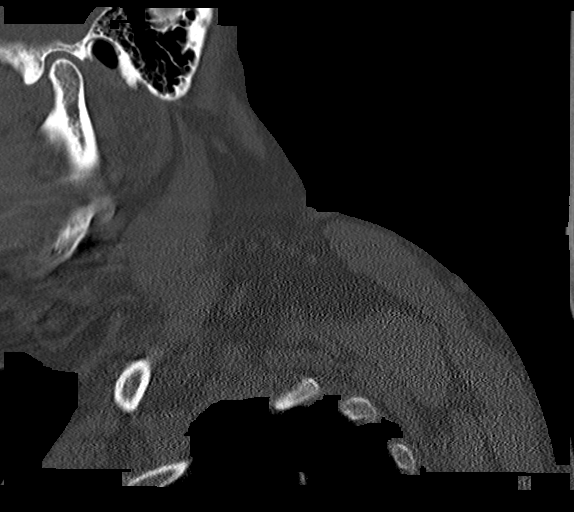
[im 22/65  bone]
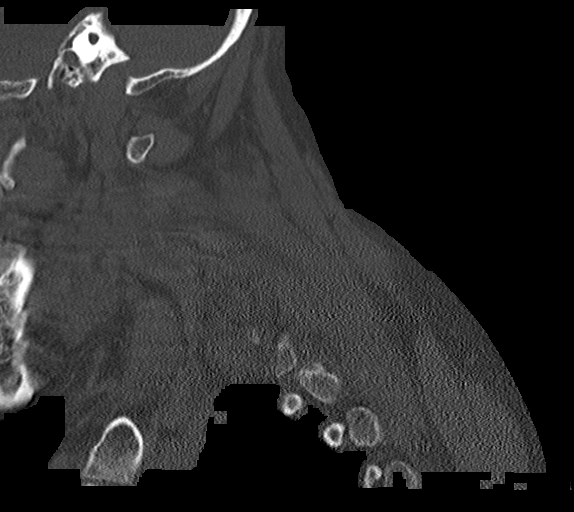
[im 33/65  bone]
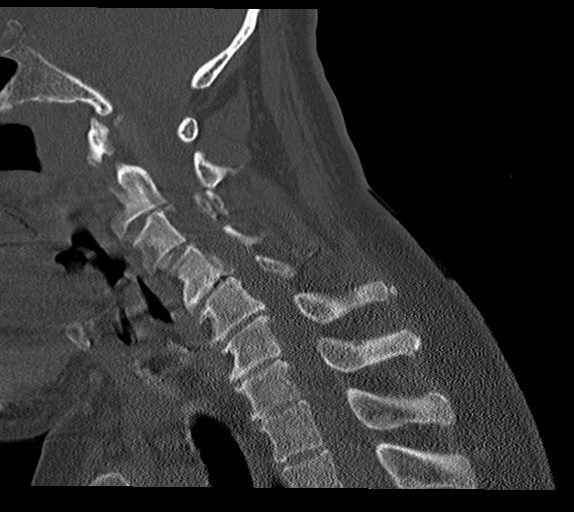
[im 43/65  bone]
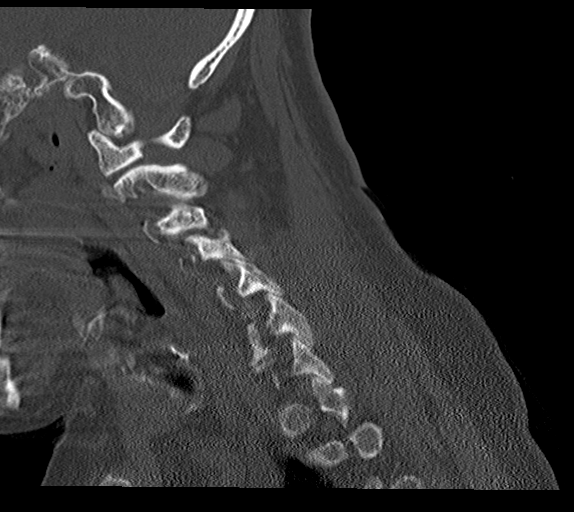
[im 54/65  bone]
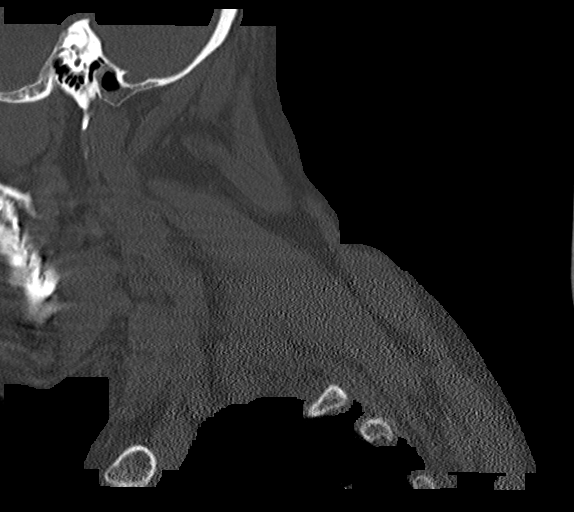

[Series 12: orthogonal bone · axial · 0.23mm/px · z∈[+1127,+1182]mm · 2 of 97 slices shown (2 of 2)]
[im 33/97  bone]
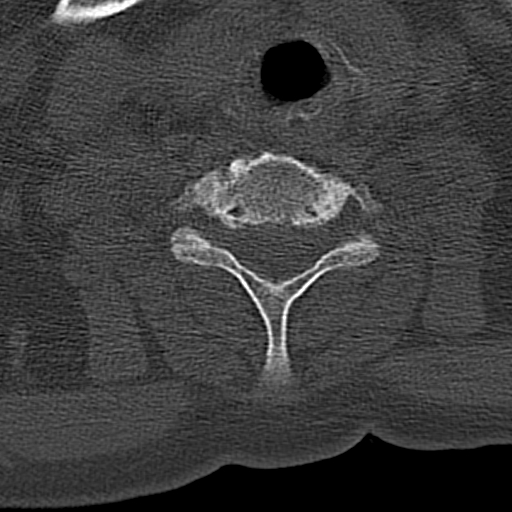
[im 65/97  bone]
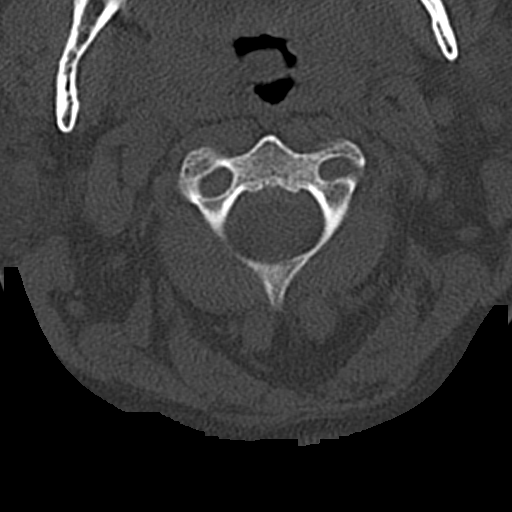

[12 of 33 positions shown; findings below may reference images not displayed]

FINDINGS: CT HEAD FINDINGS

Brain: No evidence of acute infarction, hemorrhage, hydrocephalus,
extra-axial collection or mass lesion/mass effect.

Vascular: There calcifications at both carotid siphons but no
hyperdense central vessels.

Skull: Normal. Negative for fracture or focal lesion.

Sinuses/Orbits: Chronic right maxillary sinus mucosal thickening
with small air-fluid level is unchanged. There is increased left
maxillary sinus concentric membrane disease and scattered opacity in
the ethmoids.

The frontal and sphenoid sinus and mastoid air cells are clear. A
chronic depressed fracture of the medial wall right orbit is again
noted chronic fracture deformity left zygomatic arch. Findings are
unchanged.

Other: None.

CT CERVICAL SPINE FINDINGS

Factors affecting image quality: Patient motion artifact. Image
acquisition was attempted x2 with multilevel motion degradation on
both attempts.

Alignment: Reversed cervical lordosis again noted and similar to the
previous exam. There is no AP listhesis or scoliosis.

The facet joints are normally aligned but there is up to 3 mm new
widening of the left C3-4 facet joint, with facet hypertrophy
disproportionately at this level unclear whether this is due to a
left facet joint effusion or left-sided ligamentous injury.

There are no joint surface erosions visible. The prior study
demonstrated asymmetric facet hypertrophy at this level as well but
no joint space widening.

The other facet joints show lesser degenerative features without
widening.

Skull base and vertebrae: No acute fracture. No primary bone lesion
or focal pathologic process.

Soft tissues and spinal canal: No prevertebral fluid or swelling. No
visible canal hematoma. There are mild calcifications in the
proximal cervical ICA.

Disc levels: Moderately advanced degenerative discopathy and
bidirectional osteophytes are again noted C4-5 through C7-T1
associated with ventral cord surface flattening and deformity and
spinal canal stenosis which is most significant at C4-5, C5-6 and
C7-T1, where there is suspected mild spondylotic cord compression.
This was seen previously.

At C1-2, osteophytes and narrowing of the anterior atlantodental
joint appear similar to the previous exam. The C2-3 and C3-4 discs
are normal in heights.

As above there is mild facet spurring except at the left C3-4 facet
joint where the osteophytes are more prominent.

Additional uncinate spurring is seen with foraminal stenosis which
is moderate on the left at C3-4, moderate to severe on the right at
C4-5, bilaterally moderate to severe C5-6, bilaterally moderate at
C6-7, mild-to-moderate on the left at C7-T1.

Upper chest: Negative.

Other: None.
IMPRESSION: 1. No acute intracranial CT findings, depressed skull fractures or
interval changes.
2. Chronic membrane thickening and fluid right maxillary sinus, new
moderate membrane thickening left maxillary sinus common bilateral
ethmoids.
3. Old right lamina papyracea depressed fracture.
4. No evidence of cervical spine fractures or listhesis.
5. Up to 3 mm new widening of the left C3-4 facet joint. There is
disproportionately prominent facet hypertrophy of this joint. The
findings could be due to a new joint effusion or ligamentous injury.
No joint surface erosion is seen.
6. Degenerative changes with 3 levels with posterior disc osteophyte
complexes moderately encroaching on the cord surface. Multilevel
foraminal stenosis. This was seen previously.

## 2022-07-12 ENCOUNTER — Ambulatory Visit: Payer: Self-pay | Admitting: Internal Medicine

## 2022-07-15 ENCOUNTER — Ambulatory Visit: Payer: Self-pay | Admitting: Internal Medicine

## 2022-07-30 ENCOUNTER — Other Ambulatory Visit: Payer: Self-pay

## 2022-07-30 ENCOUNTER — Encounter (HOSPITAL_COMMUNITY): Payer: Self-pay

## 2022-07-30 ENCOUNTER — Emergency Department (HOSPITAL_COMMUNITY)
Admission: EM | Admit: 2022-07-30 | Discharge: 2022-07-30 | Disposition: A | Discharge status: Home / Self Care | Payer: Self-pay | Attending: Emergency Medicine | Admitting: Emergency Medicine

## 2022-07-30 DIAGNOSIS — R519 Headache, unspecified: Secondary | ICD-10-CM | POA: Insufficient documentation

## 2022-07-30 DIAGNOSIS — F1410 Cocaine abuse, uncomplicated: Secondary | ICD-10-CM | POA: Insufficient documentation

## 2022-07-30 DIAGNOSIS — Y901 Blood alcohol level of 20-39 mg/100 ml: Secondary | ICD-10-CM | POA: Insufficient documentation

## 2022-07-30 DIAGNOSIS — D72829 Elevated white blood cell count, unspecified: Secondary | ICD-10-CM | POA: Insufficient documentation

## 2022-07-30 DIAGNOSIS — F1010 Alcohol abuse, uncomplicated: Secondary | ICD-10-CM | POA: Insufficient documentation

## 2022-07-30 DIAGNOSIS — R45851 Suicidal ideations: Secondary | ICD-10-CM | POA: Insufficient documentation

## 2022-07-30 LAB — COMPREHENSIVE METABOLIC PANEL
ALT: 23 U/L (ref 0–44)
AST: 23 U/L (ref 15–41)
Albumin: 3.8 g/dL (ref 3.5–5.0)
Alkaline Phosphatase: 86 U/L (ref 38–126)
Anion gap: 10 (ref 5–15)
BUN: 7 mg/dL (ref 6–20)
CO2: 27 mmol/L (ref 22–32)
Calcium: 9.6 mg/dL (ref 8.9–10.3)
Chloride: 103 mmol/L (ref 98–111)
Creatinine, Ser: 0.69 mg/dL (ref 0.44–1.00)
GFR, Estimated: >60 mL/min (ref >60)
Glucose, Bld: 105 mg/dL — ABNORMAL HIGH (ref 70–99)
Potassium: 3.5 mmol/L (ref 3.5–5.1)
Sodium: 140 mmol/L (ref 135–145)
Total Bilirubin: 0.4 mg/dL (ref 0.3–1.2)
Total Protein: 7.4 g/dL (ref 6.5–8.1)

## 2022-07-30 LAB — CBC WITH DIFFERENTIAL/PLATELET
Abs Immature Granulocytes: 0.05 10*3/uL (ref 0.00–0.07)
Basophils Absolute: 0 10*3/uL (ref 0.0–0.1)
Basophils Relative: 0 %
Eosinophils Absolute: 0.1 10*3/uL (ref 0.0–0.5)
Eosinophils Relative: 1 %
HCT: 46.8 % — ABNORMAL HIGH (ref 36.0–46.0)
Hemoglobin: 14.5 g/dL (ref 12.0–15.0)
Immature Granulocytes: 0 %
Lymphocytes Relative: 22 %
Lymphs Abs: 2.7 10*3/uL (ref 0.7–4.0)
MCH: 27.5 pg (ref 26.0–34.0)
MCHC: 31 g/dL (ref 30.0–36.0)
MCV: 88.8 fL (ref 80.0–100.0)
Monocytes Absolute: 1.1 10*3/uL — ABNORMAL HIGH (ref 0.1–1.0)
Monocytes Relative: 9 %
Neutro Abs: 8.2 10*3/uL — ABNORMAL HIGH (ref 1.7–7.7)
Neutrophils Relative %: 68 %
Platelets: 338 10*3/uL (ref 150–400)
RBC: 5.27 MIL/uL — ABNORMAL HIGH (ref 3.87–5.11)
RDW: 14.5 % (ref 11.5–15.5)
WBC: 12.2 10*3/uL — ABNORMAL HIGH (ref 4.0–10.5)
nRBC: 0 % (ref 0.0–0.2)

## 2022-07-30 LAB — RAPID URINE DRUG SCREEN, HOSP PERFORMED
Amphetamines: NOT DETECTED
Barbiturates: NOT DETECTED
Benzodiazepines: NOT DETECTED
Cocaine: POSITIVE — AB
Opiates: NOT DETECTED
Tetrahydrocannabinol: NOT DETECTED

## 2022-07-30 LAB — SALICYLATE LEVEL: Salicylate Lvl: <7 mg/dL — ABNORMAL LOW (ref 7.0–30.0)

## 2022-07-30 LAB — I-STAT BETA HCG BLOOD, ED (MC, WL, AP ONLY): I-stat hCG, quantitative: <5 m[IU]/mL (ref <5)

## 2022-07-30 LAB — ETHANOL: Alcohol, Ethyl (B): 32 mg/dL — ABNORMAL HIGH (ref <10)

## 2022-07-30 MED ORDER — IBUPROFEN 400 MG PO TABS
600.0000 mg | ORAL_TABLET | Freq: Once | ORAL | Status: AC
  Administered 2022-07-30: 600 mg via ORAL
  Filled 2022-07-30: qty 1

## 2022-07-30 MED ORDER — BUTALBITAL-APAP-CAFFEINE 50-325-40 MG PO TABS
1.0000 | ORAL_TABLET | Freq: Once | ORAL | Status: AC
  Administered 2022-07-30: 1 via ORAL
  Filled 2022-07-30: qty 1

## 2022-07-30 NOTE — ED Provider Triage Note (Signed)
Emergency Medicine Provider Triage Evaluation Note  Theresa Rios , a 56 y.o. female  was evaluated in triage.  Pt complains of headache started this morning.  Patient states she feel like she is going to have another seizure.  States she thought of hurting herself by taking pills.  Denies HI.  Denies any tremors, chest pain, shortness of breath, nausea, vomiting, bowel changes, urinary symptoms, fever.  Review of Systems  Positive: As above Negative: As above  Physical Exam  BP 124/81 (BP Location: Right Arm)   Pulse 84   Temp 98.2 F (36.8 C) (Oral)   Resp 16   LMP 05/17/2014   SpO2 95%  Gen:   Awake, no distress   Resp:  Normal effort  MSK:   Moves extremities without difficulty  Other:  Neuroexam unremarkable  Medical Decision Making  Medically screening exam initiated at 6:25 PM.  Appropriate orders placed.  Theresa Rios was informed that the remainder of the evaluation will be completed by another provider, this initial triage assessment does not replace that evaluation, and the importance of remaining in the ED until their evaluation is complete.     Jeanelle Malling, [place] 07/30/22 2131

## 2022-07-30 NOTE — ED Triage Notes (Addendum)
Pt arrives via EMS from a [facility]. Pt called 911 because she thought she was going to have a seizure. Pt is AxOx4. C/o headache all day. During triage, patient states she has been having thoughts of suicide, with a plan to overdose. Pt denies HI.

## 2022-07-30 NOTE — ED Provider Notes (Addendum)
[facility] EMERGENCY DEPARTMENT Provider Note   CSN: [id number] Arrival date & time: 07/30/22  1759     History  Chief Complaint  Patient presents with   Headache   Suicidal    Theresa Rios is a 56 y.o. female. With pmh MDD, seizures & alcohol use who presents with headache.  Of note, her triage note had mentioned that she had plans of overdosing and suicidal ideation.  However when I speak to the patient she denies any active plans for overdose or suicide and denies any active suicidal ideation or homicidal ideation or hallucinations.  She is mainly here because she has a headache that she woke up with today.  Is been steadily worsening.  Compared to previous headaches, this is milder than prior.  It was not severe or sudden in onset.  Is not associated with any visual changes, focal weakness numbness or tingling.  She has not taken any medications for her pain.  She drank 2 beers today.  She notes intermittently smoking cocaine.  Denies any chest pain or shortness of breath.  Denies any fevers, neck pain or recent injuries.   Headache      Home Medications Prior to Admission medications   Medication Sig Start Date End Date Taking? Authorizing Provider  cefadroxil (DURICEF) 500 MG capsule Take 1 capsule (500 mg total) by mouth 2 (two) times daily. For infection 05/10/22   Lindell Spar I, NP  gabapentin (NEURONTIN) 300 MG capsule Take 1 capsule (300 mg) po daily & 2 capsules (600 mg) po Q bedtime: For agitation. 05/11/22   Lindell Spar I, NP  hydrOXYzine (ATARAX) 25 MG tablet Take 1 tablet (25 mg total) by mouth every 6 (six) hours as needed. For anxiety 05/10/22   Lindell Spar I, NP  nicotine polacrilex (NICORETTE) 2 MG gum Take 1 each (2 mg total) by mouth as needed. (May buy from over the counter): For smoking cessation. 05/10/22   Lindell Spar I, NP  sertraline (ZOLOFT) 100 MG tablet Take 1 tablet (100 mg total) by mouth daily. For depression 05/10/22   Lindell Spar I,  NP  traZODone (DESYREL) 100 MG tablet Take 1 tablet (100 mg total) by mouth at bedtime as needed for sleep. 05/10/22 06/09/22  Encarnacion Slates, NP      Allergies    Patient has no known allergies.    Review of Systems   Review of Systems  Neurological:  Positive for headaches.    Physical Exam Updated Vital Signs BP 109/75   Pulse 92   Temp 97.9 F (36.6 C) (Oral)   Resp 16   LMP 05/17/2014   SpO2 96%  Physical Exam Constitutional: Alert and oriented.  Chronically ill-appearing but no acute distress  eyes: Conjunctivae are normal. ENT      Head: Normocephalic and atraumatic.      Nose: No congestion.      Mouth/Throat: Mucous membranes are moist.      Neck: No stridor. Cardiovascular: S1, S2,  Normal and symmetric distal pulses are present in all extremities.Warm and well perfused. Respiratory: Normal respiratory effort.  O2 sat 93-95 on RA Gastrointestinal: Soft and nontender.  Musculoskeletal: Normal range of motion in all extremities. Neurologic: Normal speech and language without aphasia. AAOx4. PERRL. EOMI without nystagmus. Face symmetrical without droop. CN II-XII intact. Normal facial sensation. Tongue midline. 5/5 strength in upper and lower extremities.  Normal sensation to light touch in all extremities. Normal finger-to-nose. Normal gait. No focal neurologic  deficits are appreciated. Skin: Skin is warm, dry and intact. No rash noted. Psychiatric: Mood and affect are normal. Speech and behavior are normal.  ED Results / Procedures / Treatments   Labs (all labs ordered are listed, but only abnormal results are displayed) Labs Reviewed  COMPREHENSIVE METABOLIC PANEL - Abnormal; Notable for the following components:      Result Value   Glucose, Bld 105 (*)    All other components within normal limits  ETHANOL - Abnormal; Notable for the following components:   Alcohol, Ethyl (B) 32 (*)    All other components within normal limits  RAPID URINE DRUG SCREEN, HOSP  PERFORMED - Abnormal; Notable for the following components:   Cocaine POSITIVE (*)    All other components within normal limits  CBC WITH DIFFERENTIAL/PLATELET - Abnormal; Notable for the following components:   WBC 12.2 (*)    RBC 5.27 (*)    HCT 46.8 (*)    Neutro Abs 8.2 (*)    Monocytes Absolute 1.1 (*)    All other components within normal limits  SALICYLATE LEVEL - Abnormal; Notable for the following components:   Salicylate Lvl <7.0 (*)    All other components within normal limits  I-STAT BETA HCG BLOOD, ED ([facility], [facility], [facility] ONLY)    EKG EKG Interpretation  Date/Time:  Tuesday July 30 2022 20:19:34 EST Ventricular Rate:  78 PR Interval:  147 QRS Duration: 96 QT Interval:  422 QTC Calculation: 481 R Axis:   -22 Text Interpretation: Sinus rhythm Consider right atrial enlargement Left ventricular hypertrophy Nonspecific T abnormalities, inferior leads No acute changes Confirmed by Vivien Rossetti ([id number]) on 07/30/2022 8:35:53 PM  Radiology No results found.  Procedures Procedures    Medications Ordered in ED Medications  ibuprofen (ADVIL) tablet 600 mg (600 mg Oral Given 07/30/22 2227)  butalbital-acetaminophen-caffeine (FIORICET) 50-325-40 MG per tablet 1 tablet (1 tablet Oral Given 07/30/22 2228)    ED Course/ Medical Decision Making/ A&P                           Medical Decision Making Theresa Rios is a 56 y.o. female. With pmh MDD, seizures & alcohol use who presents with headache.  Regarding the patient's headache, I suspect it is most consistent with primary headache in nature such as tension or migraine headache. I do not think CVA as the patient has no focal neurologic deficits. Additionally, I do not think SAH as the patient did not have severe onset of pain at beginning of headache, they do not have focal neurologic deficits, and are overall non-toxic in appearance. I do not suspect meningitis with no illness symptoms, no rash, nontoxic appearance, and  no meningismus on exam.  Patient given Motrin and Fioricet for symptom control with improvement.   Out of an abundance of caution, CT head was offered to patient which she declined.  Labs were obtained because patient was initially endorsing SI at triage however as a explained by note, she is denying any active SI/HI/AVH.  She is clinically not intoxicated.  I do not think she requires a behavioral health evaluation at this time.  Her labs had a mild leukocytosis 12.2 with left shift without infectious symptoms however she was positive for cocaine which I suspect is reactive from cocaine use.  Glucose 105.  Creatinine 0.69 within normal limits.  I discussed with patient return precautions, continued supportive care with po fluids and tylenol/motrin and advise close  follow-up with PCP regarding headache as well as be headache as needed for any further behavioral complaints.  She is in agreement with plan and safe for discharge at this time.  Amount and/or Complexity of Data Reviewed Labs: ordered. Radiology: ordered.  Risk Prescription drug management.    Final Clinical Impression(s) / ED Diagnoses Final diagnoses:  Bad headache    Rx / DC Orders ED Discharge Orders     None         Mardene Sayer, MD 07/30/22 2246    Mardene Sayer, MD 07/30/22 [phone]

## 2022-07-30 NOTE — Discharge Instructions (Signed)
You have been seen in the Emergency Department (ED) for a headache.  Please use Tylenol or Motrin as needed for symptoms, but only as written on the box. You also need to drink PLENTY of water.  As we have discussed, please follow up with your primary care doctor as soon as possible, ideally within one week, regarding today's ED visit and your headache symptoms.  If you have any behavioral health concerns you can go to the behavioral Health Center listed in your discharge paperwork to discuss.  Call your doctor or return to the ED if you have a worsening headache, sudden and severe headache, confusion, slurred speech, facial droop, fainting spells, weakness or numbness in any arm or leg, extreme fatigue, or other symptoms that concern you.

## 2022-08-01 ENCOUNTER — Ambulatory Visit (HOSPITAL_COMMUNITY)
Admission: EM | Admit: 2022-08-01 | Discharge: 2022-08-02 | Disposition: A | Discharge status: Other Acute Inpatient Hospital | Payer: No Payment, Other | Attending: Student | Admitting: Student

## 2022-08-01 DIAGNOSIS — F1020 Alcohol dependence, uncomplicated: Secondary | ICD-10-CM | POA: Insufficient documentation

## 2022-08-01 DIAGNOSIS — R45851 Suicidal ideations: Secondary | ICD-10-CM | POA: Insufficient documentation

## 2022-08-01 DIAGNOSIS — F333 Major depressive disorder, recurrent, severe with psychotic symptoms: Secondary | ICD-10-CM | POA: Insufficient documentation

## 2022-08-01 DIAGNOSIS — Z1152 Encounter for screening for COVID-19: Secondary | ICD-10-CM | POA: Diagnosis not present

## 2022-08-01 DIAGNOSIS — Z79899 Other long term (current) drug therapy: Secondary | ICD-10-CM | POA: Insufficient documentation

## 2022-08-01 DIAGNOSIS — F1410 Cocaine abuse, uncomplicated: Secondary | ICD-10-CM | POA: Insufficient documentation

## 2022-08-01 DIAGNOSIS — F1090 Alcohol use, unspecified, uncomplicated: Secondary | ICD-10-CM

## 2022-08-01 LAB — CBC WITH DIFFERENTIAL/PLATELET
Abs Immature Granulocytes: 0.04 10*3/uL (ref 0.00–0.07)
Basophils Absolute: 0.1 10*3/uL (ref 0.0–0.1)
Basophils Relative: 0 %
Eosinophils Absolute: 0.2 10*3/uL (ref 0.0–0.5)
Eosinophils Relative: 1 %
HCT: 41.2 % (ref 36.0–46.0)
Hemoglobin: 13.1 g/dL (ref 12.0–15.0)
Immature Granulocytes: 0 %
Lymphocytes Relative: 31 %
Lymphs Abs: 3.4 10*3/uL (ref 0.7–4.0)
MCH: 28.4 pg (ref 26.0–34.0)
MCHC: 31.8 g/dL (ref 30.0–36.0)
MCV: 89.2 fL (ref 80.0–100.0)
Monocytes Absolute: 1.1 10*3/uL — ABNORMAL HIGH (ref 0.1–1.0)
Monocytes Relative: 10 %
Neutro Abs: 6.4 10*3/uL (ref 1.7–7.7)
Neutrophils Relative %: 58 %
Platelets: 306 10*3/uL (ref 150–400)
RBC: 4.62 MIL/uL (ref 3.87–5.11)
RDW: 14.8 % (ref 11.5–15.5)
WBC: 11.2 10*3/uL — ABNORMAL HIGH (ref 4.0–10.5)
nRBC: 0 % (ref 0.0–0.2)

## 2022-08-01 LAB — ETHANOL: Alcohol, Ethyl (B): 112 mg/dL — ABNORMAL HIGH (ref <10)

## 2022-08-01 LAB — POC SARS CORONAVIRUS 2 AG: SARSCOV2ONAVIRUS 2 AG: NEGATIVE

## 2022-08-01 LAB — RESP PANEL BY RT-PCR (FLU A&B, COVID) ARPGX2
Influenza A by PCR: NEGATIVE
Influenza B by PCR: NEGATIVE
SARS Coronavirus 2 by RT PCR: NEGATIVE

## 2022-08-01 LAB — TSH: TSH: 0.618 u[IU]/mL (ref 0.350–4.500)

## 2022-08-01 MED ORDER — OLANZAPINE 5 MG PO TBDP: 5.0000 mg | ORAL_TABLET | Freq: Three times a day (TID) | ORAL | Status: DC | PRN

## 2022-08-01 MED ORDER — LORAZEPAM 1 MG PO TABS: 1.0000 mg | ORAL_TABLET | Freq: Four times a day (QID) | ORAL | Status: DC | PRN

## 2022-08-01 MED ORDER — MAGNESIUM HYDROXIDE 400 MG/5ML PO SUSP: 30.0000 mL | Freq: Every day | ORAL | Status: DC | PRN

## 2022-08-01 MED ORDER — ADULT MULTIVITAMIN W/MINERALS CH
1.0000 | ORAL_TABLET | Freq: Every day | ORAL | Status: DC
  Administered 2022-08-01 – 2022-08-02 (×2): 1 via ORAL
  Filled 2022-08-01 (×3): qty 1

## 2022-08-01 MED ORDER — LOPERAMIDE HCL 2 MG PO CAPS: 2.0000 mg | ORAL_CAPSULE | ORAL | Status: DC | PRN

## 2022-08-01 MED ORDER — HYDROXYZINE HCL 25 MG PO TABS: 25.0000 mg | ORAL_TABLET | Freq: Three times a day (TID) | ORAL | Status: DC | PRN

## 2022-08-01 MED ORDER — THIAMINE HCL 100 MG/ML IJ SOLN
100.0000 mg | Freq: Once | INTRAMUSCULAR | Status: AC
  Administered 2022-08-01: 100 mg via INTRAMUSCULAR
  Filled 2022-08-01: qty 2

## 2022-08-01 MED ORDER — OLANZAPINE 5 MG PO TBDP
5.0000 mg | ORAL_TABLET | Freq: Every day | ORAL | Status: DC
  Administered 2022-08-01: 5 mg via ORAL
  Filled 2022-08-01: qty 1

## 2022-08-01 MED ORDER — ALUM & MAG HYDROXIDE-SIMETH 200-200-20 MG/5ML PO SUSP: 30.0000 mL | ORAL | Status: DC | PRN

## 2022-08-01 MED ORDER — ACETAMINOPHEN 325 MG PO TABS: 650.0000 mg | ORAL_TABLET | Freq: Four times a day (QID) | ORAL | Status: DC | PRN

## 2022-08-01 MED ORDER — TRAZODONE HCL 100 MG PO TABS: 100.0000 mg | ORAL_TABLET | Freq: Every evening | ORAL | Status: DC | PRN

## 2022-08-01 MED ORDER — HYDROXYZINE HCL 25 MG PO TABS: 25.0000 mg | ORAL_TABLET | Freq: Four times a day (QID) | ORAL | Status: DC | PRN

## 2022-08-01 MED ORDER — ZIPRASIDONE MESYLATE 20 MG IM SOLR: 20.0000 mg | INTRAMUSCULAR | Status: DC | PRN

## 2022-08-01 MED ORDER — LORAZEPAM 1 MG PO TABS: 1.0000 mg | ORAL_TABLET | ORAL | Status: DC | PRN

## 2022-08-01 MED ORDER — ONDANSETRON 4 MG PO TBDP: 4.0000 mg | ORAL_TABLET | Freq: Four times a day (QID) | ORAL | Status: DC | PRN

## 2022-08-01 MED ORDER — THIAMINE MONONITRATE 100 MG PO TABS
100.0000 mg | ORAL_TABLET | Freq: Every day | ORAL | Status: DC
  Administered 2022-08-02: 100 mg via ORAL
  Filled 2022-08-01: qty 1

## 2022-08-01 NOTE — BH Assessment (Addendum)
Theresa Rios, urgent; 55 year old presents to [facility] voluntarily and unaccompanied.  Pt reports SI with a plan to take pills, "I don't want to be here anymore, I am alone, I don't have no one".   Pt presents crying, anxious and sad.  Pt reports "the voices are telling me to hurt myself, take the pills".  Pt denies HI.  Pt reports drinking one beer on 07/31/22.  Pt admits to  prior MH diagnosis or prescribed medication for symptom management.  MSE signed by patient.

## 2022-08-01 NOTE — ED Notes (Signed)
Pt resting in bed. A&O x4, calm and cooperative. Denies current SI/HI/AVH. No signs of distress noted. Monitoring for safety. 

## 2022-08-01 NOTE — ED Provider Notes (Signed)
[facility] Continuous Assessment Admission H&P  Date: 08/01/22 Patient Name: Theresa Rios MRN: [id number] Chief Complaint:  Chief Complaint  Patient presents with   Suicidal      Diagnoses:  Final diagnoses:  None    HPI: Haliegh Whisenant is a 56 year old female with a psychiatric history of MDD-recurrent/severe/without psychotic features, alcohol use disorder, anxiety, and insomnia as well as two inpatient psychiatric admissions within the past 6 months who presents voluntarily for SI with a plan to overdose on pills.  Patient is very tearful, paranoid appearing, tangential, and disorganized throughout the assessment.  It is difficult to obtain history from her.  She reports that she has SI with a plan to overdose on pills and command auditory hallucinations telling her to take those pills, which has been ongoing over the past week.  Patient reports that she presented to the ED 2 days ago on 11/21, in which she reported the suicidal ideation, but was discharged after workup for chest pain.  She was informed that her UDS at the time was positive for cocaine, and she had a slightly elevated BAC.  She seemed surprised about cocaine in her urine, and stated that she has not used any drugs since.  She did endorse drinking one additional 12 ounce beer within the past 24 hours.  She denies that she has ever had any withdrawal symptoms/seizures.  Patient reports that she was unable to pick up her medications yesterday, and she has been noncompliant with her medications since last week.  She is unsure of her current medication regimen.  She reports that her last seizure was approximately 2.5 weeks ago, and they were caused by head trauma, denying them being due to alcohol.  In her tearfulness, patient endorses that she feels worthless, like a failure because she is unmarried and without kids.  She says that she has been beating herself up.  At the time of this tangent, patient appears to respond to  someone else.  She continues to endorse SI today, but denies HI and VH.  She denies access to weapons, but was evasive when asked about her living situation, stating "I am fine."   Collateral: Spoke to friend, Judie Petit [phone])- He reports that patient needs help and is unstable. She has difficulty focusing, speaks non-sensibly, and makes poor decisions. He was perseverant on the fact that she needs help and was unable to provide further information.  He was appreciative of the information knowing that patient is safe.  PHQ 2-9:   [place] ED from 07/30/2022 in [place] Most recent reading at 07/30/2022  6:09 PM Admission (Discharged) from OP Visit from 05/06/2022 in [place] [facility] Most recent reading at 05/06/2022  5:15 PM ED from 05/06/2022 in [place] DEPT Most recent reading at 05/06/2022  7:01 AM  C-SSRS RISK CATEGORY High Risk No Risk No Risk        Total Time spent with patient: 45 minutes  Musculoskeletal  Strength & Muscle Tone: within normal limits Gait & Station: normal Patient leans: N/A  Psychiatric Specialty Exam  Presentation General Appearance:  Disheveled (and malodorous)  Eye Contact: Fleeting (Darting around the room as though paranoid)  Speech: Garbled (As patient was tearful)  Speech Volume: Normal  Handedness: Right   Mood and Affect  Mood: Dysphoric  Affect: Depressed; Tearful   Thought Process  Thought Processes: Disorganized  Descriptions of Associations:Tangential  Orientation:Full (Time, Place and Person)  Thought Content:Tangential; Paranoid Ideation  Diagnosis of Schizophrenia or Schizoaffective disorder in past: No  Duration of Psychotic Symptoms: Less than six months  Hallucinations:Hallucinations: Auditory; Command Description of Command Hallucinations: Telling her to OD on pills  Ideas of  Reference:None  Suicidal Thoughts:Suicidal Thoughts: Yes, Active SI Active Intent and/or Plan: With Intent; With Plan  Homicidal Thoughts:Homicidal Thoughts: No   Sensorium  Memory: Immediate Fair; Recent Fair  Judgment: Impaired  Insight: Shallow   Executive Functions  Concentration: Fair  Attention Span: Fair  Recall: [employer] of Knowledge: Fair  Language: Fair   Psychomotor Activity  Psychomotor Activity: Psychomotor Activity: Normal   Assets  Assets: Desire for Improvement; Financial Resources/Insurance; Leisure Time; Social Support   Sleep  Sleep: Sleep: Fair   Nutritional Assessment (For OBS and FBC admissions only) Has the patient had a weight loss or gain of 10 pounds or more in the last 3 months?: No Has the patient had a decrease in food intake/or appetite?: No Does the patient have dental problems?: Yes Does the patient have eating habits or behaviors that may be indicators of an eating disorder including binging or inducing vomiting?: No Has the patient recently lost weight without trying?: 0 Has the patient been eating poorly because of a decreased appetite?: 0 Malnutrition Screening Tool Score: 0    Physical Exam Vitals reviewed.  Constitutional:      General: She is not in acute distress.    Appearance: She is not toxic-appearing.  HENT:     Head: Normocephalic.     Comments: Well-healed scar on nose without active bleeding, erythema, or evidence of infection.    Mouth/Throat:     Mouth: Mucous membranes are moist.     Pharynx: Oropharynx is clear.  Pulmonary:     Effort: Pulmonary effort is normal.  Neurological:     Mental Status: She is alert and oriented to person, place, and time.    Review of Systems  Cardiovascular:        No further chest pain since ED presentation on 11/21  Gastrointestinal: Negative.   Genitourinary: Negative.   Neurological:  Positive for seizures. Negative for dizziness, tremors and  headaches.       Last seizure reported 2.5 weeks ago    Blood pressure 100/65, pulse 100, temperature 98.2 F (36.8 C), temperature source Oral, resp. rate 19, last menstrual period 05/17/2014, SpO2 95 %. There is no height or weight on file to calculate BMI.  Past Psychiatric History: MDD, ETOH use d/o.  Inpatient psychiatric admission 03/2022 and 04/2022 at [facility]  Is the patient at risk to self? Yes  Has the patient been a risk to self in the past 6 months? Yes .    Has the patient been a risk to self within the distant past? No   Is the patient a risk to others? No   Has the patient been a risk to others in the past 6 months? No   Has the patient been a risk to others within the distant past? No   Past Medical History:  Past Medical History:  Diagnosis Date   Seizures ([place])    No past surgical history on file.  Family History: No family history on file.  Social History:  Social History   Socioeconomic History   Marital status: Soil scientist    Spouse name: Not on file   Number of children: 0   Years of education: Not on file   Highest education level: Not  on file  Occupational History   Not on file  Tobacco Use   Smoking status: Every Day    Packs/day: 0.50    Types: Cigarettes   Smokeless tobacco: Never  Vaping Use   Vaping Use: Never used  Substance and Sexual Activity   Alcohol use: Yes    Comment: 4-5 beers a day most days   Drug use: Yes    Types: Cocaine, Marijuana   Sexual activity: Not on file  Other Topics Concern   Not on file  Social History Narrative   Pt lives in [place] with a friend. Her husband is living with his mother. Pt recently lost her job and her place to stay.   Social Determinants of Health   Financial Resource Strain: Not on file  Food Insecurity: Not on file  Transportation Needs: Not on file  Physical Activity: Not on file  Stress: Not on file  Social Connections: Not on file  Intimate Partner Violence: Not on file     SDOH:  SDOH Screenings   Alcohol Screen: Medium Risk (05/06/2022)  Depression (PHQ2-9): Low Risk  (04/03/2022)  Tobacco Use: High Risk (07/30/2022)    Last Labs:  Admission on 07/30/2022, Discharged on 07/30/2022  Component Date Value Ref Range Status   Sodium 07/30/2022 140  135 - 145 mmol/L Final   Potassium 07/30/2022 3.5  3.5 - 5.1 mmol/L Final   Chloride 07/30/2022 103  98 - 111 mmol/L Final   CO2 07/30/2022 27  22 - 32 mmol/L Final   Glucose, Bld 07/30/2022 105 (H)  70 - 99 mg/dL Final   Glucose reference range applies only to samples taken after fasting for at least 8 hours.   BUN 07/30/2022 7  6 - 20 mg/dL Final   Creatinine, Ser 07/30/2022 0.69  0.44 - 1.00 mg/dL Final   Calcium 07/30/2022 9.6  8.9 - 10.3 mg/dL Final   Total Protein 07/30/2022 7.4  6.5 - 8.1 g/dL Final   Albumin 07/30/2022 3.8  3.5 - 5.0 g/dL Final   AST 07/30/2022 23  15 - 41 U/L Final   ALT 07/30/2022 23  0 - 44 U/L Final   Alkaline Phosphatase 07/30/2022 86  38 - 126 U/L Final   Total Bilirubin 07/30/2022 0.4  0.3 - 1.2 mg/dL Final   GFR, Estimated 07/30/2022 >60  >60 mL/min Final   Comment: (NOTE) Calculated using the CKD-EPI Creatinine Equation (2021)    Anion gap 07/30/2022 10  5 - 15 Final   Performed at [place] [address] [place], [place] [address]   Alcohol, Ethyl (B) 07/30/2022 32 (H)  <10 mg/dL Final   Comment: (NOTE) Lowest detectable limit for serum alcohol is 10 mg/dL.  For medical purposes only. Performed at [facility], [place] [address] [place],  [address]    Opiates 07/30/2022 NONE DETECTED  NONE DETECTED Final   Cocaine 07/30/2022 POSITIVE (A)  NONE DETECTED Final   Benzodiazepines 07/30/2022 NONE DETECTED  NONE DETECTED Final   Amphetamines 07/30/2022 NONE DETECTED  NONE DETECTED Final   Tetrahydrocannabinol 07/30/2022 NONE DETECTED  NONE DETECTED Final   Barbiturates 07/30/2022 NONE DETECTED  NONE DETECTED Final   Comment: (NOTE) DRUG SCREEN  FOR MEDICAL PURPOSES ONLY.  IF CONFIRMATION IS NEEDED FOR ANY PURPOSE, NOTIFY LAB WITHIN 5 DAYS.  LOWEST DETECTABLE LIMITS FOR URINE DRUG SCREEN Drug Class  Cutoff (ng/mL) Amphetamine and metabolites    1000 Barbiturate and metabolites    200 Benzodiazepine                 200 Opiates and metabolites        300 Cocaine and metabolites        300 THC                            50 Performed at [facility], [place] [address], [place], [place] [address]    WBC 07/30/2022 12.2 (H)  4.0 - 10.5 K/uL Final   RBC 07/30/2022 5.27 (H)  3.87 - 5.11 MIL/uL Final   Hemoglobin 07/30/2022 14.5  12.0 - 15.0 g/dL Final   HCT 07/30/2022 46.8 (H)  36.0 - 46.0 % Final   MCV 07/30/2022 88.8  80.0 - 100.0 fL Final   MCH 07/30/2022 27.5  26.0 - 34.0 pg Final   MCHC 07/30/2022 31.0  30.0 - 36.0 g/dL Final   RDW 07/30/2022 14.5  11.5 - 15.5 % Final   Platelets 07/30/2022 338  150 - 400 K/uL Final   nRBC 07/30/2022 0.0  0.0 - 0.2 % Final   Neutrophils Relative % 07/30/2022 68  % Final   Neutro Abs 07/30/2022 8.2 (H)  1.7 - 7.7 K/uL Final   Lymphocytes Relative 07/30/2022 22  % Final   Lymphs Abs 07/30/2022 2.7  0.7 - 4.0 K/uL Final   Monocytes Relative 07/30/2022 9  % Final   Monocytes Absolute 07/30/2022 1.1 (H)  0.1 - 1.0 K/uL Final   Eosinophils Relative 07/30/2022 1  % Final   Eosinophils Absolute 07/30/2022 0.1  0.0 - 0.5 K/uL Final   Basophils Relative 07/30/2022 0  % Final   Basophils Absolute 07/30/2022 0.0  0.0 - 0.1 K/uL Final   Immature Granulocytes 07/30/2022 0  % Final   Abs Immature Granulocytes 07/30/2022 0.05  0.00 - 0.07 K/uL Final   Performed at [facility], [place] [address] [place], [place] [address]   I-stat hCG, quantitative 07/30/2022 <5.0  <5 mIU/mL Final   Comment 3 07/30/2022          Final   Comment:   GEST. AGE      CONC.  (mIU/mL)   <=1 WEEK        5 - 50     2 WEEKS       50 - 500     3 WEEKS       100 - 10,000     4 WEEKS     1,000 -  30,000        FEMALE AND NON-PREGNANT FEMALE:     LESS THAN 5 mIU/mL    Salicylate Lvl [id number] <7.0 (L)  7.0 - 30.0 mg/dL Final   Performed at [facility], [place] [address], [place], [place] [address]  Admission on 05/06/2022, Discharged on 05/10/2022  Component Date Value Ref Range Status   Color, Urine 05/06/2022 YELLOW  YELLOW Final   APPearance 05/06/2022 CLEAR  CLEAR Final   Specific Gravity, Urine 05/06/2022 1.036 (H)  1.005 - 1.030 Final   pH 05/06/2022 6.0  5.0 - 8.0 Final   Glucose, UA 05/06/2022 NEGATIVE  NEGATIVE mg/dL Final   Hgb urine dipstick 05/06/2022 NEGATIVE  NEGATIVE Final   Bilirubin Urine 05/06/2022 NEGATIVE  NEGATIVE Final   Ketones, ur 05/06/2022 NEGATIVE  NEGATIVE mg/dL Final   Protein,  ur 05/06/2022 NEGATIVE  NEGATIVE mg/dL Final   Nitrite 05/06/2022 NEGATIVE  NEGATIVE Final   Leukocytes,Ua 05/06/2022 MODERATE (A)  NEGATIVE Final   RBC / HPF 05/06/2022 6-10  0 - 5 RBC/hpf Final   WBC, UA 05/06/2022 21-50  0 - 5 WBC/hpf Final   Bacteria, UA 05/06/2022 RARE (A)  NONE SEEN Final   Squamous Epithelial / LPF 05/06/2022 0-5  0 - 5 Final   Ca Oxalate Crys, UA 05/06/2022 PRESENT   Final   Performed at [facility], [place] [address] [place], [place] [address]   Opiates 05/06/2022 NONE DETECTED  NONE DETECTED Final   Cocaine 05/06/2022 POSITIVE (A)  NONE DETECTED Final   Benzodiazepines 05/06/2022 POSITIVE (A)  NONE DETECTED Final   Amphetamines 05/06/2022 NONE DETECTED  NONE DETECTED Final   Tetrahydrocannabinol 05/06/2022 NONE DETECTED  NONE DETECTED Final   Barbiturates 05/06/2022 NONE DETECTED  NONE DETECTED Final   Comment: (NOTE) DRUG SCREEN FOR MEDICAL PURPOSES ONLY.  IF CONFIRMATION IS NEEDED FOR ANY PURPOSE, NOTIFY LAB WITHIN 5 DAYS.  LOWEST DETECTABLE LIMITS FOR URINE DRUG SCREEN Drug Class                     Cutoff (ng/mL) Amphetamine and metabolites    1000 Barbiturate and metabolites    200 Benzodiazepine                  [id number] Tricyclics and metabolites     300 Opiates and metabolites        300 Cocaine and metabolites        300 THC                            50 Performed at [facility], [place] [address] [place], [place] [address]    WBC 05/07/2022 8.4  4.0 - 10.5 K/uL Final   RBC 05/07/2022 4.55  3.87 - 5.11 MIL/uL Final   Hemoglobin 05/07/2022 12.9  12.0 - 15.0 g/dL Final   HCT 05/07/2022 41.0  36.0 - 46.0 % Final   MCV 05/07/2022 90.1  80.0 - 100.0 fL Final   MCH 05/07/2022 28.4  26.0 - 34.0 pg Final   MCHC 05/07/2022 31.5  30.0 - 36.0 g/dL Final   RDW 05/07/2022 13.7  11.5 - 15.5 % Final   Platelets 05/07/2022 209  150 - 400 K/uL Final   nRBC 05/07/2022 0.0  0.0 - 0.2 % Final   Performed at [facility], [place] [address], [place], [place] [address]   Sodium 05/07/2022 142  135 - 145 mmol/L Final   DELTA CHECK NOTED   Potassium 05/07/2022 3.8  3.5 - 5.1 mmol/L Final   Chloride 05/07/2022 109  98 - 111 mmol/L Final   CO2 05/07/2022 27  22 - 32 mmol/L Final   Glucose, Bld 05/07/2022 124 (H)  70 - 99 mg/dL Final   Glucose reference range applies only to samples taken after fasting for at least 8 hours.   BUN 05/07/2022 8  6 - 20 mg/dL Final   Creatinine, Ser 05/07/2022 0.65  0.44 - 1.00 mg/dL Final   Calcium 05/07/2022 9.3  8.9 - 10.3 mg/dL Final   Total Protein 05/07/2022 7.3  6.5 - 8.1 g/dL Final   Albumin 05/07/2022 3.6  3.5 - 5.0 g/dL Final   AST 05/07/2022 18  15 - 41 U/L Final   ALT 05/07/2022 25  0 -  44 U/L Final   Alkaline Phosphatase 05/07/2022 63  38 - 126 U/L Final   Total Bilirubin 05/07/2022 0.4  0.3 - 1.2 mg/dL Final   GFR, Estimated 05/07/2022 >60  >60 mL/min Final   Comment: (NOTE) Calculated using the CKD-EPI Creatinine Equation (2021)    Anion gap 05/07/2022 6  5 - 15 Final   Performed at [facility], [place] [address], [place], [place] [address]   TSH 05/07/2022 2.965  0.350 - 4.500 uIU/mL Final   Comment: Performed by a  3rd Generation assay with a functional sensitivity of <=0.01 uIU/mL. Performed at [facility], [place] [address], [place], [place] [address]    Color, Urine 05/07/2022 YELLOW  YELLOW Final   APPearance 05/07/2022 CLEAR  CLEAR Final   Specific Gravity, Urine 05/07/2022 1.011  1.005 - 1.030 Final   pH 05/07/2022 6.0  5.0 - 8.0 Final   Glucose, UA 05/07/2022 NEGATIVE  NEGATIVE mg/dL Final   Hgb urine dipstick 05/07/2022 NEGATIVE  NEGATIVE Final   Bilirubin Urine 05/07/2022 NEGATIVE  NEGATIVE Final   Ketones, ur 05/07/2022 NEGATIVE  NEGATIVE mg/dL Final   Protein, ur 05/07/2022 NEGATIVE  NEGATIVE mg/dL Final   Nitrite 05/07/2022 NEGATIVE  NEGATIVE Final   Leukocytes,Ua 05/07/2022 MODERATE (A)  NEGATIVE Final   RBC / HPF 05/07/2022 0-5  0 - 5 RBC/hpf Final   WBC, UA 05/07/2022 0-5  0 - 5 WBC/hpf Final   Bacteria, UA 05/07/2022 RARE (A)  NONE SEEN Final   Squamous Epithelial / LPF 05/07/2022 0-5  0 - 5 Final   Performed at [facility], [place] [address], [place], [place] [address]  Admission on 05/06/2022, Discharged on 05/06/2022  Component Date Value Ref Range Status   WBC 05/06/2022 9.4  4.0 - 10.5 K/uL Final   RBC 05/06/2022 4.33  3.87 - 5.11 MIL/uL Final   Hemoglobin 05/06/2022 12.4  12.0 - 15.0 g/dL Final   HCT 05/06/2022 38.1  36.0 - 46.0 % Final   MCV 05/06/2022 88.0  80.0 - 100.0 fL Final   MCH 05/06/2022 28.6  26.0 - 34.0 pg Final   MCHC 05/06/2022 32.5  30.0 - 36.0 g/dL Final   RDW 05/06/2022 13.5  11.5 - 15.5 % Final   Platelets 05/06/2022 189  150 - 400 K/uL Final   nRBC 05/06/2022 0.0  0.0 - 0.2 % Final   Performed at [facility], [place] [address], [place], [place] [address]   Sodium 05/06/2022 135  135 - 145 mmol/L Final   Potassium 05/06/2022 3.5  3.5 - 5.1 mmol/L Final   Chloride 05/06/2022 103  98 - 111 mmol/L Final   CO2 05/06/2022 25  22 - 32 mmol/L Final   Glucose, Bld 05/06/2022 179 (H)  70 - 99 mg/dL Final    Glucose reference range applies only to samples taken after fasting for at least 8 hours.   BUN 05/06/2022 7  6 - 20 mg/dL Final   Creatinine, Ser 05/06/2022 0.57  0.44 - 1.00 mg/dL Final   Calcium 05/06/2022 8.9  8.9 - 10.3 mg/dL Final   GFR, Estimated 05/06/2022 >60  >60 mL/min Final   Comment: (NOTE) Calculated using the CKD-EPI Creatinine Equation (2021)    Anion gap 05/06/2022 7  5 - 15 Final   Performed at [facility], [place] [address] [place], [place] [address]   Color, Urine 05/06/2022 YELLOW  YELLOW Final   APPearance 05/06/2022 HAZY (A)  CLEAR Final  Specific Gravity, Urine 05/06/2022 1.014  1.005 - 1.030 Final   pH 05/06/2022 6.0  5.0 - 8.0 Final   Glucose, UA 05/06/2022 NEGATIVE  NEGATIVE mg/dL Final   Hgb urine dipstick 05/06/2022 NEGATIVE  NEGATIVE Final   Bilirubin Urine 05/06/2022 NEGATIVE  NEGATIVE Final   Ketones, ur 05/06/2022 NEGATIVE  NEGATIVE mg/dL Final   Protein, ur 05/06/2022 NEGATIVE  NEGATIVE mg/dL Final   Nitrite 05/06/2022 POSITIVE (A)  NEGATIVE Final   Leukocytes,Ua 05/06/2022 SMALL (A)  NEGATIVE Final   RBC / HPF 05/06/2022 6-10  0 - 5 RBC/hpf Final   WBC, UA 05/06/2022 21-50  0 - 5 WBC/hpf Final   Bacteria, UA 05/06/2022 MANY (A)  NONE SEEN Final   Squamous Epithelial / LPF 05/06/2022 0-5  0 - 5 Final   Mucus 05/06/2022 PRESENT   Final   Performed at [facility], [place] [address], [place], [place] [address]   Specimen Description 05/06/2022    Final                   Value:URINE, CLEAN CATCH Performed at [facility], [place] [address], [place], [place] [address]    Special Requests 05/06/2022    Final                   Value:NONE Performed at [facility], [place] [address], [place], [place] [address]    Culture 05/06/2022 >=100,000 COLONIES/mL ESCHERICHIA COLI (A)   Final   Report Status 05/06/2022 05/08/2022 FINAL   Final   Organism ID, Bacteria 05/06/2022 ESCHERICHIA COLI (A)    Final  Admission on 04/11/2022, Discharged on 04/13/2022  Component Date Value Ref Range Status   Alcohol, Ethyl (B) 04/11/2022 <10  <10 mg/dL Final   Comment: (NOTE) Lowest detectable limit for serum alcohol is 10 mg/dL.  For medical purposes only. Performed at [facility], [place] [address], [place], [place] [address]    WBC 04/11/2022 14.2 (H)  4.0 - 10.5 K/uL Final   RBC 04/11/2022 4.90  3.87 - 5.11 MIL/uL Final   Hemoglobin 04/11/2022 13.9  12.0 - 15.0 g/dL Final   HCT 04/11/2022 43.6  36.0 - 46.0 % Final   MCV 04/11/2022 89.0  80.0 - 100.0 fL Final   MCH 04/11/2022 28.4  26.0 - 34.0 pg Final   MCHC 04/11/2022 31.9  30.0 - 36.0 g/dL Final   RDW 04/11/2022 14.2  11.5 - 15.5 % Final   Platelets 04/11/2022 253  150 - 400 K/uL Final   nRBC 04/11/2022 0.0  0.0 - 0.2 % Final   Performed at [place] [address] [place], [place] [address]   Neutrophils Relative % 04/11/2022 72  % Final   Neutro Abs 04/11/2022 10.2 (H)  1.7 - 7.7 K/uL Final   Lymphocytes Relative 04/11/2022 19  % Final   Lymphs Abs 04/11/2022 2.7  0.7 - 4.0 K/uL Final   Monocytes Relative 04/11/2022 7  % Final   Monocytes Absolute 04/11/2022 1.0  0.1 - 1.0 K/uL Final   Eosinophils Relative 04/11/2022 1  % Final   Eosinophils Absolute 04/11/2022 0.1  0.0 - 0.5 K/uL Final   Basophils Relative 04/11/2022 0  % Final   Basophils Absolute 04/11/2022 0.0  0.0 - 0.1 K/uL Final   Immature Granulocytes 04/11/2022 1  % Final   Abs Immature Granulocytes 04/11/2022 0.07  0.00 - 0.07 K/uL Final   Performed at [facility],  [address] [place] [address] [place], [place] [address]   Sodium 04/11/2022 139  135 - 145 mmol/L Final   Potassium 04/11/2022 3.9  3.5 - 5.1 mmol/L Final   Chloride 04/11/2022 105  98 - 111 mmol/L Final   CO2 04/11/2022 15 (L)  22 - 32 mmol/L Final   Glucose, Bld 04/11/2022 152 (H)  70 - 99 mg/dL Final   Glucose reference range applies only to samples taken after fasting for at least 8 hours.    BUN 04/11/2022 11  6 - 20 mg/dL Final   Creatinine, Ser 04/11/2022 1.01 (H)  0.44 - 1.00 mg/dL Final   Calcium 04/11/2022 9.2  8.9 - 10.3 mg/dL Final   Total Protein 04/11/2022 7.4  6.5 - 8.1 g/dL Final   Albumin 04/11/2022 3.8  3.5 - 5.0 g/dL Final   AST 04/11/2022 38  15 - 41 U/L Final   ALT 04/11/2022 24  0 - 44 U/L Final   Alkaline Phosphatase 04/11/2022 68  38 - 126 U/L Final   Total Bilirubin 04/11/2022 0.7  0.3 - 1.2 mg/dL Final   GFR, Estimated 04/11/2022 >60  >60 mL/min Final   Comment: (NOTE) Calculated using the CKD-EPI Creatinine Equation (2021)    Anion gap 04/11/2022 19 (H)  5 - 15 Final   Performed at [place] [address] [place], [place] [address]   Opiates 04/11/2022 NONE DETECTED  NONE DETECTED Final   Cocaine 04/11/2022 POSITIVE (A)  NONE DETECTED Final   Benzodiazepines 04/11/2022 POSITIVE (A)  NONE DETECTED Final   Amphetamines 04/11/2022 NONE DETECTED  NONE DETECTED Final   Tetrahydrocannabinol 04/11/2022 NONE DETECTED  NONE DETECTED Final   Barbiturates 04/11/2022 NONE DETECTED  NONE DETECTED Final   Comment: (NOTE) DRUG SCREEN FOR MEDICAL PURPOSES ONLY.  IF CONFIRMATION IS NEEDED FOR ANY PURPOSE, NOTIFY LAB WITHIN 5 DAYS.  LOWEST DETECTABLE LIMITS FOR URINE DRUG SCREEN Drug Class                     Cutoff (ng/mL) Amphetamine and metabolites    1000 Barbiturate and metabolites    200 Benzodiazepine                 [id number] Tricyclics and metabolites     300 Opiates and metabolites        300 Cocaine and metabolites        300 THC                            50 Performed at [facility], [place] [address], [place], [place] [address]    Color, Urine 04/11/2022 YELLOW  YELLOW Final   APPearance 04/11/2022 HAZY (A)  CLEAR Final   Specific Gravity, Urine 04/11/2022 1.014  1.005 - 1.030 Final   pH 04/11/2022 6.0  5.0 - 8.0 Final   Glucose, UA 04/11/2022 NEGATIVE  NEGATIVE mg/dL Final   Hgb urine dipstick 04/11/2022 NEGATIVE  NEGATIVE Final    Bilirubin Urine 04/11/2022 NEGATIVE  NEGATIVE Final   Ketones, ur 04/11/2022 20 (A)  NEGATIVE mg/dL Final   Protein, ur 04/11/2022 NEGATIVE  NEGATIVE mg/dL Final   Nitrite 04/11/2022 NEGATIVE  NEGATIVE Final   Leukocytes,Ua 04/11/2022 MODERATE (A)  NEGATIVE Final   RBC / HPF 04/11/2022 11-20  0 - 5 RBC/hpf Final   WBC, UA 04/11/2022 21-50  0 - 5 WBC/hpf Final   Bacteria, UA 04/11/2022 FEW (A)  NONE SEEN Final  Squamous Epithelial / LPF 04/11/2022 6-10  0 - 5 Final   Trans Epithel, UA 04/11/2022 NONE SEEN   Corrected   Comment: CORRECTED RESULTS CALLED TO: JOHNSON,R RN @ [id number] 04/11/22 LEONARD,A CORRECTED ON 08/03 AT 1203: PREVIOUSLY REPORTED AS <1    Urine-Other 04/11/2022 TRICHOMONAS PRESENT   Corrected   Performed at [facility], [place] [address] [place], [place] [address]   [place] test code 04/11/2022 [id number]   Final   [facility] test name 04/11/2022 FENTANYL AND METABOLITE   Final   Performed at [facility], [place] [address] [place],  [address]   Misc [facility] result 04/11/2022 COMMENT   Final   Comment: (NOTE) Test Ordered: [id number] Fentanyl and Metabolite Fentanyl                       Note:            ng/mL    NMSPA     None Detected                                                 Reference Range: 0.3-1.5                               Immediately following a single 2 mcg/kg I.V. dose: Up to 11 ng/mL, declining to 1 ng/mL after one hour.  Following the application of a 123XX123 mcg/hour transdermal patch, serum levels (after an initial lag time of approximately six hours) of 0.8-2.6 ng/mL were maintained for more than 24 hours after application. Peak plasma levels following a single oral transmucosal dose (Fentanyl Oralet) of 15 mcg/kg to children: 2-4 ng/mL at 20 minutes. This test was developed and its performance characteristics determined by [facility]. It has not been cleared or approved by the Food and Drug Administration.                                 Detection Limit = 0.1 Norfentanyl                    Note:            ng/mL    NMSPA     None Detected                                                                           Reference Range: Not Estab.                            Substance(s) known to interfere with the identity and/or quantity of the reported result:  Benzyl Fentanyl. ___________________________________________________________ This test was developed and its performance characteristics by [facility].  It has not been cleared or approved by the [place] Food and Drug Administration. This test was developed and its performance characteristics determined by [facility]. It has not been cleared or approved by the Food and Drug  Administration.                                Detection Limit = 0.1 Performed At: [facility] [place], [place] [id number] Rush Farmer MD [id number] Performed At: [place] [id number] Catalina Gravel PhD [id number]    Lactic Acid, Venous 04/11/2022 1.1  0.5 - 1.9 mmol/L Final   Performed at [facility], [place] [address] [place], [place] [address]   Sodium 04/12/2022 139  135 - 145 mmol/L Final   Potassium 04/12/2022 3.1 (L)  3.5 - 5.1 mmol/L Final   Chloride 04/12/2022 104  98 - 111 mmol/L Final   CO2 04/12/2022 22  22 - 32 mmol/L Final   Glucose, Bld 04/12/2022 79  70 - 99 mg/dL Final   Glucose reference range applies only to samples taken after fasting for at least 8 hours.   BUN 04/12/2022 8  6 - 20 mg/dL Final   Creatinine, Ser 04/12/2022 0.73  0.44 - 1.00 mg/dL Final   DELTA CHECK NOTED   Calcium 04/12/2022 9.5  8.9 - 10.3 mg/dL Final   GFR, Estimated 04/12/2022 >60  >60 mL/min Final   Comment: (NOTE) Calculated using the CKD-EPI Creatinine Equation (2021)    Anion gap 04/12/2022 13  5 - 15 Final   Performed at [facility] [address], [place], [place] [address]   WBC 04/12/2022 7.2  4.0 - 10.5 K/uL  Final   RBC 04/12/2022 4.67  3.87 - 5.11 MIL/uL Final   Hemoglobin 04/12/2022 13.3  12.0 - 15.0 g/dL Final   HCT 04/12/2022 39.6  36.0 - 46.0 % Final   MCV 04/12/2022 84.8  80.0 - 100.0 fL Final   MCH 04/12/2022 28.5  26.0 - 34.0 pg Final   MCHC 04/12/2022 33.6  30.0 - 36.0 g/dL Final   RDW 04/12/2022 13.8  11.5 - 15.5 % Final   Platelets 04/12/2022 204  150 - 400 K/uL Final   nRBC 04/12/2022 0.0  0.0 - 0.2 % Final   Performed at [facility] [address] [place], [place] [address]   Sodium 04/13/2022 137  135 - 145 mmol/L Final   Potassium 04/13/2022 3.6  3.5 - 5.1 mmol/L Final   Chloride 04/13/2022 104  98 - 111 mmol/L Final   CO2 04/13/2022 22  22 - 32 mmol/L Final   Glucose, Bld 04/13/2022 124 (H)  70 - 99 mg/dL Final   Glucose reference range applies only to samples taken after fasting for at least 8 hours.   BUN 04/13/2022 13  6 - 20 mg/dL Final   Creatinine, Ser 04/13/2022 0.70  0.44 - 1.00 mg/dL Final   Calcium 04/13/2022 9.4  8.9 - 10.3 mg/dL Final   GFR, Estimated 04/13/2022 >60  >60 mL/min Final   Comment: (NOTE) Calculated using the CKD-EPI Creatinine Equation (2021)    Anion gap 04/13/2022 11  5 - 15 Final   Performed at [facility], [place] [address], [place], [place] [address]   WBC 04/13/2022 8.9  4.0 - 10.5 K/uL Final   RBC 04/13/2022 4.62  3.87 - 5.11 MIL/uL Final   Hemoglobin 04/13/2022 13.2  12.0 - 15.0 g/dL Final   HCT 04/13/2022 39.2  36.0 - 46.0 % Final   MCV 04/13/2022 84.8  80.0 - 100.0 fL Final   MCH 04/13/2022 28.6  26.0 - 34.0 pg Final   MCHC 04/13/2022 33.7  30.0 - 36.0 g/dL Final   RDW 04/13/2022 13.5  11.5 - 15.5 % Final   Platelets 04/13/2022 200  150 - 400 K/uL Final   nRBC 04/13/2022 0.0  0.0 - 0.2 % Final   Performed at [facility], [place] [address], [place], [place] [address]  Admission on 03/21/2022, Discharged on 03/27/2022  Component Date Value Ref Range Status   Color, Urine 03/22/2022 STRAW (A)  YELLOW Final    APPearance 03/22/2022 CLEAR  CLEAR Final   Specific Gravity, Urine 03/22/2022 1.009  1.005 - 1.030 Final   pH 03/22/2022 6.0  5.0 - 8.0 Final   Glucose, UA 03/22/2022 NEGATIVE  NEGATIVE mg/dL Final   Hgb urine dipstick 03/22/2022 NEGATIVE  NEGATIVE Final   Bilirubin Urine 03/22/2022 NEGATIVE  NEGATIVE Final   Ketones, ur 03/22/2022 NEGATIVE  NEGATIVE mg/dL Final   Protein, ur 03/22/2022 NEGATIVE  NEGATIVE mg/dL Final   Nitrite 03/22/2022 NEGATIVE  NEGATIVE Final   Leukocytes,Ua 03/22/2022 LARGE (A)  NEGATIVE Final   RBC / HPF 03/22/2022 0-5  0 - 5 RBC/hpf Final   WBC, UA 03/22/2022 6-10  0 - 5 WBC/hpf Final   Bacteria, UA 03/22/2022 RARE (A)  NONE SEEN Final   Squamous Epithelial / LPF 03/22/2022 0-5  0 - 5 Final   Performed at [facility], [place] [address] [place], [place] [address]   Cholesterol 03/22/2022 223 (H)  0 - 200 mg/dL Final   Triglycerides 03/22/2022 102  <150 mg/dL Final   HDL 03/22/2022 91  >40 mg/dL Final   Total CHOL/HDL Ratio 03/22/2022 2.5  RATIO Final   VLDL 03/22/2022 20  0 - 40 mg/dL Final   LDL Cholesterol 03/22/2022 112 (H)  0 - 99 mg/dL Final   Comment:        Total Cholesterol/HDL:CHD Risk Coronary Heart Disease Risk Table                     Men   Women  1/2 Average Risk   3.4   3.3  Average Risk       5.0   4.4  2 X Average Risk   9.6   7.1  3 X Average Risk  23.4   11.0        Use the calculated Patient Ratio above and the CHD Risk Table to determine the patient's CHD Risk.        ATP III CLASSIFICATION (LDL):  <100     mg/dL   Optimal  100-129  mg/dL   Near or Above                    Optimal  130-159  mg/dL   Borderline  160-189  mg/dL   High  >190     mg/dL   Very High Performed at [facility] [address] [place],  [address]    TSH 03/22/2022 1.567  0.350 - 4.500 uIU/mL Final   Comment: Performed by a 3rd Generation assay with a functional sensitivity of <=0.01 uIU/mL. Performed at [facility], [place] [address], [place], [place] [address]    Hgb A1c MFr Bld 03/22/2022 5.7 (H)  4.8 - 5.6 % Final   Comment: (NOTE) Pre diabetes:          5.7%-6.4%  Diabetes:              >  6.4%  Glycemic control for   <7.0% adults with diabetes    Mean Plasma Glucose 03/22/2022 116.89  mg/dL Final   Performed at [facility] [address], [place], [place] [address]   Specimen Description 03/22/2022    Final                   Value:URINE, CLEAN CATCH Performed at [facility], [place] [address], [place], [place] [address]    Special Requests 03/22/2022    Final                   Value:NONE Performed at [facility], [place] [address], [place], [place] [address]    Culture 03/22/2022 MULTIPLE SPECIES PRESENT, SUGGEST RECOLLECTION (A)   Final   Report Status 03/22/2022 03/24/2022 FINAL   Final   Neisseria Gonorrhea 03/22/2022 Negative   Final   Chlamydia 03/22/2022 Negative   Final   Comment 03/22/2022 Normal Reference Ranger Chlamydia - Negative   Final   Comment 03/22/2022 Normal Reference Range Neisseria Gonorrhea - Negative   Final   RPR Ser Ql 03/22/2022 NON REACTIVE  NON REACTIVE Final   Performed at [facility], [place] [address] [place], [place] [address]   HIV Screen 4th Generation wRfx 03/22/2022 Non Reactive  Non Reactive Final   Performed at [facility], [place] [address], [place], [place] [address]  Admission on 03/19/2022, Discharged on 03/21/2022  Component Date Value Ref Range Status   Sodium 03/19/2022 144  135 - 145 mmol/L Final   Potassium 03/19/2022 3.6  3.5 - 5.1 mmol/L Final   Chloride 03/19/2022 108  98 - 111 mmol/L Final   CO2 03/19/2022 22  22 - 32 mmol/L Final   Glucose, Bld 03/19/2022 94  70 - 99 mg/dL Final   Glucose reference range applies only to samples taken after fasting for at least 8 hours.   BUN 03/19/2022 8  6 - 20 mg/dL Final   Creatinine, Ser 03/19/2022 0.58  0.44 - 1.00 mg/dL  Final   Calcium 03/19/2022 9.9  8.9 - 10.3 mg/dL Final   Total Protein 03/19/2022 7.7  6.5 - 8.1 g/dL Final   Albumin 03/19/2022 4.1  3.5 - 5.0 g/dL Final   AST 03/19/2022 22  15 - 41 U/L Final   ALT 03/19/2022 21  0 - 44 U/L Final   Alkaline Phosphatase 03/19/2022 68  38 - 126 U/L Final   Total Bilirubin 03/19/2022 0.4  0.3 - 1.2 mg/dL Final   GFR, Estimated 03/19/2022 >60  >60 mL/min Final   Comment: (NOTE) Calculated using the CKD-EPI Creatinine Equation (2021)    Anion gap 03/19/2022 14  5 - 15 Final   Performed at [facility] [address], [place], [place] [id number]   Salicylate Lvl 123XX123 <7.0 (L)  7.0 - 30.0 mg/dL Final   Performed at [facility] [address] [place], [place] [address]   Acetaminophen (Tylenol), Serum 03/19/2022 <10 (L)  10 - 30 ug/mL Final   Comment: (NOTE) Therapeutic concentrations vary significantly. A range of 10-30 ug/mL  may be an effective concentration for many patients. However, some  are best treated at concentrations outside of this range. Acetaminophen concentrations >150 ug/mL at 4 hours after ingestion  and >50 ug/mL at 12 hours after ingestion are often associated with  toxic reactions.  Performed at [facility], [place] [address] [place], [place] [address]  WBC 03/19/2022 6.9  4.0 - 10.5 K/uL Final   RBC 03/19/2022 4.75  3.87 - 5.11 MIL/uL Final   Hemoglobin 03/19/2022 13.4  12.0 - 15.0 g/dL Final   HCT 31/54/0086 41.3  36.0 - 46.0 % Final   MCV 03/19/2022 86.9  80.0 - 100.0 fL Final   MCH 03/19/2022 28.2  26.0 - 34.0 pg Final   MCHC 03/19/2022 32.4  30.0 - 36.0 g/dL Final   RDW 76/19/5093 14.3  11.5 - 15.5 % Final   Platelets 03/19/2022 233  150 - 400 K/uL Final   nRBC 03/19/2022 0.0  0.0 - 0.2 % Final   Neutrophils Relative % 03/19/2022 40  % Final   Neutro Abs 03/19/2022 2.8  1.7 - 7.7 K/uL Final   Lymphocytes Relative 03/19/2022 45  % Final   Lymphs Abs 03/19/2022 3.1  0.7 - 4.0 K/uL Final    Monocytes Relative 03/19/2022 12  % Final   Monocytes Absolute 03/19/2022 0.9  0.1 - 1.0 K/uL Final   Eosinophils Relative 03/19/2022 2  % Final   Eosinophils Absolute 03/19/2022 0.1  0.0 - 0.5 K/uL Final   Basophils Relative 03/19/2022 1  % Final   Basophils Absolute 03/19/2022 0.1  0.0 - 0.1 K/uL Final   Immature Granulocytes 03/19/2022 0  % Final   Abs Immature Granulocytes 03/19/2022 0.02  0.00 - 0.07 K/uL Final   Performed at [facility], [address], [place], [place] [address]   Glucose-Capillary 03/19/2022 127 (H)  70 - 99 mg/dL Final   Glucose reference range applies only to samples taken after fasting for at least 8 hours.   Color, Urine 03/19/2022 YELLOW  YELLOW Final   APPearance 03/19/2022 CLOUDY (A)  CLEAR Final   Specific Gravity, Urine 03/19/2022 1.004 (L)  1.005 - 1.030 Final   pH 03/19/2022 5.0  5.0 - 8.0 Final   Glucose, UA 03/19/2022 NEGATIVE  NEGATIVE mg/dL Final   Hgb urine dipstick 03/19/2022 LARGE (A)  NEGATIVE Final   Bilirubin Urine 03/19/2022 NEGATIVE  NEGATIVE Final   Ketones, ur 03/19/2022 NEGATIVE  NEGATIVE mg/dL Final   Protein, ur 45/80/9983 NEGATIVE  NEGATIVE mg/dL Final   Nitrite 38/25/0539 NEGATIVE  NEGATIVE Final   Leukocytes,Ua 03/19/2022 MODERATE (A)  NEGATIVE Final   RBC / HPF 03/19/2022 6-10  0 - 5 RBC/hpf Final   WBC, UA 03/19/2022 11-20  0 - 5 WBC/hpf Final   Bacteria, UA 03/19/2022 RARE (A)  NONE SEEN Final   Squamous Epithelial / LPF 03/19/2022 11-20  0 - 5 Final   Mucus 03/19/2022 PRESENT   Final   Performed at [facility], [address] [place], [place] [address]   Alcohol, Ethyl (B) 03/19/2022 178 (H)  <10 mg/dL Final   Comment: (NOTE) Lowest detectable limit for serum alcohol is 10 mg/dL.  For medical purposes only. Performed at [facility], [address], [place], [place] [address]    Opiates 03/19/2022 NONE DETECTED  NONE DETECTED Final   Cocaine 03/19/2022 POSITIVE (A)  NONE DETECTED Final    Benzodiazepines 03/19/2022 NONE DETECTED  NONE DETECTED Final   Amphetamines 03/19/2022 NONE DETECTED  NONE DETECTED Final   Tetrahydrocannabinol 03/19/2022 NONE DETECTED  NONE DETECTED Final   Barbiturates 03/19/2022 NONE DETECTED  NONE DETECTED Final   Comment: (NOTE) DRUG SCREEN FOR MEDICAL PURPOSES ONLY.  IF CONFIRMATION IS NEEDED FOR ANY PURPOSE, NOTIFY LAB WITHIN 5 DAYS.  LOWEST DETECTABLE LIMITS FOR URINE DRUG SCREEN Drug Class  Cutoff (ng/mL) Amphetamine and metabolites    1000 Barbiturate and metabolites    200 Benzodiazepine                 A999333 Tricyclics and metabolites     300 Opiates and metabolites        300 Cocaine and metabolites        300 THC                            50 Performed at [facility], [place] [address], [place], [place] [address]    Magnesium 03/19/2022 2.4  1.7 - 2.4 mg/dL Final   Performed at [facility] [address], [place], [place] [address]   SARS Coronavirus 2 by RT PCR 03/19/2022 NEGATIVE  NEGATIVE Final   Comment: (NOTE) SARS-CoV-2 target nucleic acids are NOT DETECTED.  The SARS-CoV-2 RNA is generally detectable in upper respiratory specimens during the acute phase of infection. The lowest concentration of SARS-CoV-2 viral copies this assay can detect is 138 copies/mL. A negative result does not preclude SARS-Cov-2 infection and should not be used as the sole basis for treatment or other patient management decisions. A negative result may occur with  improper specimen collection/handling, submission of specimen other than nasopharyngeal swab, presence of viral mutation(s) within the areas targeted by this assay, and inadequate number of viral copies(<138 copies/mL). A negative result must be combined with clinical observations, patient history, and epidemiological information. The expected result is Negative.  Fact Sheet for Patients:  EntrepreneurPulse.com.au  Fact Sheet for  Healthcare Providers:  IncredibleEmployment.be  This test is no                          t yet approved or cleared by the [place] FDA and  has been authorized for detection and/or diagnosis of SARS-CoV-2 by FDA under an Emergency Use Authorization (EUA). This EUA will remain  in effect (meaning this test can be used) for the duration of the COVID-19 declaration under Section 564(b)(1) of the Act, 21 U.S.C.section 360bbb-3(b)(1), unless the authorization is terminated  or revoked sooner.       Influenza A by PCR 03/19/2022 NEGATIVE  NEGATIVE Final   Influenza B by PCR 03/19/2022 NEGATIVE  NEGATIVE Final   Comment: (NOTE) The Xpert Xpress SARS-CoV-2/FLU/RSV plus assay is intended as an aid in the diagnosis of influenza from Nasopharyngeal swab specimens and should not be used as a sole basis for treatment. Nasal washings and aspirates are unacceptable for Xpert Xpress SARS-CoV-2/FLU/RSV testing.  Fact Sheet for Patients: EntrepreneurPulse.com.au  Fact Sheet for Healthcare Providers: IncredibleEmployment.be  This test is not yet approved or cleared by the [place] FDA and has been authorized for detection and/or diagnosis of SARS-CoV-2 by FDA under an Emergency Use Authorization (EUA). This EUA will remain in effect (meaning this test can be used) for the duration of the COVID-19 declaration under Section 564(b)(1) of the Act, 21 U.S.C. section 360bbb-3(b)(1), unless the authorization is terminated or revoked.  Performed at [facility], [place] [address] [place], [place] [address]    Preg Test, Ur 03/19/2022 NEGATIVE  NEGATIVE Final   Comment:        THE SENSITIVITY OF THIS METHODOLOGY IS >20 mIU/mL. Performed at [facility], [place] [address] [place], [place] [address]    Specimen Description 03/19/2022 URINE, CLEAN CATCH   Final  Special Requests 03/19/2022    Final                    Value:NONE Performed at [facility], [place] [address], [place], [place] [address]    Culture 03/19/2022 MULTIPLE SPECIES PRESENT, SUGGEST RECOLLECTION (A)   Final   Report Status 03/19/2022 03/21/2022 FINAL   Final  Admission on 03/14/2022, Discharged on 03/14/2022  Component Date Value Ref Range Status   Glucose-Capillary 03/14/2022 127 (H)  70 - 99 mg/dL Final   Glucose reference range applies only to samples taken after fasting for at least 8 hours.   Sodium 03/14/2022 141  135 - 145 mmol/L Final   Potassium 03/14/2022 3.7  3.5 - 5.1 mmol/L Final   Chloride 03/14/2022 110  98 - 111 mmol/L Final   CO2 03/14/2022 23  22 - 32 mmol/L Final   Glucose, Bld 03/14/2022 130 (H)  70 - 99 mg/dL Final   Glucose reference range applies only to samples taken after fasting for at least 8 hours.   BUN 03/14/2022 10  6 - 20 mg/dL Final   Creatinine, Ser 03/14/2022 0.75  0.44 - 1.00 mg/dL Final   Calcium 03/14/2022 9.4  8.9 - 10.3 mg/dL Final   Total Protein 03/14/2022 7.5  6.5 - 8.1 g/dL Final   Albumin 03/14/2022 3.9  3.5 - 5.0 g/dL Final   AST 03/14/2022 21  15 - 41 U/L Final   ALT 03/14/2022 19  0 - 44 U/L Final   Alkaline Phosphatase 03/14/2022 67  38 - 126 U/L Final   Total Bilirubin 03/14/2022 0.3  0.3 - 1.2 mg/dL Final   GFR, Estimated 03/14/2022 >60  >60 mL/min Final   Comment: (NOTE) Calculated using the CKD-EPI Creatinine Equation (2021)    Anion gap 03/14/2022 8  5 - 15 Final   Performed at [facility], [place] [address], [place], [place] [address]   WBC 03/14/2022 6.5  4.0 - 10.5 K/uL Final   RBC 03/14/2022 4.43  3.87 - 5.11 MIL/uL Final   Hemoglobin 03/14/2022 12.7  12.0 - 15.0 g/dL Final   HCT 03/14/2022 39.6  36.0 - 46.0 % Final   MCV 03/14/2022 89.4  80.0 - 100.0 fL Final   MCH 03/14/2022 28.7  26.0 - 34.0 pg Final   MCHC 03/14/2022 32.1  30.0 - 36.0 g/dL Final   RDW 03/14/2022 14.4  11.5 - 15.5 % Final   Platelets 03/14/2022 263  150 - 400 K/uL Final    nRBC 03/14/2022 0.0  0.0 - 0.2 % Final   Neutrophils Relative % 03/14/2022 43  % Final   Neutro Abs 03/14/2022 2.8  1.7 - 7.7 K/uL Final   Lymphocytes Relative 03/14/2022 43  % Final   Lymphs Abs 03/14/2022 2.8  0.7 - 4.0 K/uL Final   Monocytes Relative 03/14/2022 11  % Final   Monocytes Absolute 03/14/2022 0.7  0.1 - 1.0 K/uL Final   Eosinophils Relative 03/14/2022 2  % Final   Eosinophils Absolute 03/14/2022 0.1  0.0 - 0.5 K/uL Final   Basophils Relative 03/14/2022 1  % Final   Basophils Absolute 03/14/2022 0.0  0.0 - 0.1 K/uL Final   Immature Granulocytes 03/14/2022 0  % Final   Abs Immature Granulocytes 03/14/2022 0.02  0.00 - 0.07 K/uL Final   Performed at [facility], [place] [address], [place], [place] [address]   Color, Urine 03/14/2022 YELLOW  YELLOW Final   APPearance 03/14/2022 CLEAR  CLEAR Final   Specific Gravity, Urine 03/14/2022 1.011  1.005 - 1.030 Final   pH 03/14/2022 5.0  5.0 - 8.0 Final   Glucose, UA 03/14/2022 NEGATIVE  NEGATIVE mg/dL Final   Hgb urine dipstick 03/14/2022 NEGATIVE  NEGATIVE Final   Bilirubin Urine 03/14/2022 NEGATIVE  NEGATIVE Final   Ketones, ur 03/14/2022 NEGATIVE  NEGATIVE mg/dL Final   Protein, ur 03/14/2022 NEGATIVE  NEGATIVE mg/dL Final   Nitrite 03/14/2022 NEGATIVE  NEGATIVE Final   Leukocytes,Ua 03/14/2022 TRACE (A)  NEGATIVE Final   RBC / HPF 03/14/2022 0-5  0 - 5 RBC/hpf Final   WBC, UA 03/14/2022 0-5  0 - 5 WBC/hpf Final   Bacteria, UA 03/14/2022 NONE SEEN  NONE SEEN Final   Squamous Epithelial / LPF 03/14/2022 0-5  0 - 5 Final   Mucus 03/14/2022 PRESENT   Final   Hyaline Casts, UA 03/14/2022 PRESENT   Final   Performed at [facility], [place] [address] [place], [place] [address]   Opiates 03/14/2022 NONE DETECTED  NONE DETECTED Final   Cocaine 03/14/2022 POSITIVE (A)  NONE DETECTED Final   Benzodiazepines 03/14/2022 NONE DETECTED  NONE DETECTED Final   Amphetamines 03/14/2022 NONE DETECTED  NONE  DETECTED Final   Tetrahydrocannabinol 03/14/2022 NONE DETECTED  NONE DETECTED Final   Barbiturates 03/14/2022 NONE DETECTED  NONE DETECTED Final   Comment: (NOTE) DRUG SCREEN FOR MEDICAL PURPOSES ONLY.  IF CONFIRMATION IS NEEDED FOR ANY PURPOSE, NOTIFY LAB WITHIN 5 DAYS.  LOWEST DETECTABLE LIMITS FOR URINE DRUG SCREEN Drug Class                     Cutoff (ng/mL) Amphetamine and metabolites    1000 Barbiturate and metabolites    200 Benzodiazepine                 [id number] Tricyclics and metabolites     300 Opiates and metabolites        300 Cocaine and metabolites        300 THC                            50 Performed at [facility], [place] [address], [place], [place] [address]    Alcohol, Ethyl (B) 03/14/2022 202 (H)  <10 mg/dL Final   Comment: (NOTE) Lowest detectable limit for serum alcohol is 10 mg/dL.  For medical purposes only. Performed at [facility], [place] [address], [place], [place] [address]    Acetaminophen (Tylenol), Serum 03/14/2022 <10 (L)  10 - 30 ug/mL Final   Comment: (NOTE) Therapeutic concentrations vary significantly. A range of 10-30 ug/mL  may be an effective concentration for many patients. However, some  are best treated at concentrations outside of this range. Acetaminophen concentrations >150 ug/mL at 4 hours after ingestion  and >50 ug/mL at 12 hours after ingestion are often associated with  toxic reactions.  Performed at [facility], [place] [address], [place], [place] [id number]    Salicylate Lvl 99991111 <7.0 (L)  7.0 - 30.0 mg/dL Final   Performed at [place] [address] [place], [place] [address]   Lactic Acid, Venous 03/14/2022 1.6  0.5 - 1.9 mmol/L Final   Performed at [facility] [address] [place], [place] [address]   Ammonia 03/14/2022 48 (H)  9 - 35 umol/L Final   Performed at [facility], [place] [address], [place], [place] [address]   Total CK 03/14/2022 69  38 - 234 U/L Final   Performed at [facility], [place] [address] [place], [place] [address]   Troponin I (High Sensitivity) 03/14/2022 4  <18 ng/L Final   Comment: (NOTE) Elevated high sensitivity troponin I (hsTnI) values and significant  changes across serial measurements may suggest ACS but many other  chronic and acute conditions are known to elevate hsTnI results.  Refer to the "Links" section for chest pain algorithms and additional  guidance. Performed at [facility], [place] [address], [place], [place] [address]    Troponin I (High Sensitivity) 03/14/2022 4  <18 ng/L Final   Comment: (NOTE) Elevated high sensitivity troponin I (hsTnI) values and significant  changes across serial measurements may suggest ACS but many other  chronic and acute conditions are known to elevate hsTnI results.  Refer to the "Links" section for chest pain algorithms and additional  guidance. Performed at [facility], [place] [address] [place], [place] [address]     Allergies: Patient has no known allergies.  PTA Medications: (Not in a hospital admission)   Medical Decision Making  Based on disorganized thoughts, paranoid appearing presentation, tangential speech, and active suicidal ideation, patient is recommended for inpatient psychiatric admission.  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  Rosezetta Schlatter, MD 08/01/22  1:32 PM

## 2022-08-01 NOTE — BH Assessment (Addendum)
Comprehensive Clinical Assessment (CCA) Note  08/01/2022 Theresa Rios [id number]  Chief Complaint:  Chief Complaint  Patient presents with   Suicidal   Visit Diagnosis:  F33.3 Major depressive disorder, Recurrent episode, With psychotic features   Flowsheet Row ED from 08/01/2022 in [facility] ED from 07/30/2022 in [facility] EMERGENCY DEPARTMENT Admission (Discharged) from OP [facility]  C-SSRS RISK CATEGORY High Risk High Risk No Risk      High risk = 1:1 sitter  The patient demonstrates the following risk factors for suicide: Chronic risk factors for suicide include: psychiatric disorder of major depressive disorder, substance use disorder, and previous suicide attempts overdose . Acute risk factors for suicide include: social withdrawal/isolation and loss (financial, interpersonal, professional). Protective factors for this patient include: positive social support, positive therapeutic relationship, coping skills, hope for the future, and life satisfaction. Considering these factors, the overall suicide risk at this point appears to be high risk. Patient is not appropriate for outpatient follow up.    Disposition: Lamar Sprinkles MD, patient meets inpatient criteria.  [facility] contacted and bed availability under review.  Disposition Social Worker will secure placement in AM. Disposition discuss with Nurse, children's.   Theresa Rios is a 56 year old female who presents voluntarily to [facility] and unaccompanied.  Pt gave TTS permission to contact Reece Levy, spouse, [phone].  Tearful Pt reports SI with a plan to overdose on medication, "I stopped taken my medication, because I will overdose on it".  Pt reports hearing voices, "the voices tell me to hurt myself, take the pills".   Pt reports she have been diagnosed with major depression. Pt denies HI.   Pt presents paranoia,' looking  around and distracted' during the presentation.  Pt acknowledges symptoms: crying spells, sadness, social withdrawal, loss of interests, fatigue, hopelessness, worrying, tension and feeling of worthlessness.  Pt reports she have been walking for two days, "I have not had any sleep; also, reports that she is eating twice a day.  Pt says she have been drinking beer, "I drank one beer on yesterday".  Pt admitted to smoking cracking cocaine a while back, "I don't know how much, they gave it to me".  Pt denies using any other substance.  Pt admits to smoking cigarettes daily.  Pt identifies her primary stressor as with loneliness, "I am alone, I don't have anyone, I don't want to be here".  Pt reports she lives alone, and no where to go.  Pt provide Rhodia Albright contact information, [phone], godmother.  Pt denies family mental health history; also, denies family substance used information.  Pt reports she is worried about the new diagnosed Hernia and being able to receive the [employer] for surgery. Pt denies any current legal problems.  Pt reports no guns or weapons in her possessions.  Pt says she was receiving weekly outpatient therapy with [facility] Counselor, no longer participate in therapy.  Pt reports receiving outpatient medication management; also, admitted not taken medication as prescribed.  Pt reports previous inpatient psychiatric hospitalization in August 2023.  Pt is dressed casual, alert, oriented x 4 with normal speech and calm motor behaviors.   Eye contact is normal, and Pt is tearful.  Pt's mood is depressed, and affect is anxious.  Thought process is distracted.  Pt's insight is lacking, and judgment is impaired.  There is no indication Pt is currently responding to internal stimuli or experiencing delusional  thought content.  Pt was cooperative throughout assessment.          CCA Screening, Triage and Referral (STR)  Patient Reported Information How did you hear about  [place]? Self  What Is the Reason for Your Visit/Call Today? SI with a plan to take pills  How Long Has This Been Causing You Problems? <Week  What Do You Feel Would Help You the Most Today? Treatment for Depression or other mood problem   Have You Recently Had Any Thoughts About Hurting Yourself? Yes  Are You Planning to Commit Suicide/Harm Yourself At This time? No   Flowsheet Row ED from 08/01/2022 in [facility] ED from 07/30/2022 in [facility] EMERGENCY DEPARTMENT Admission (Discharged) from OP [facility]  C-SSRS RISK CATEGORY High Risk High Risk No Risk       Have you Recently Had Thoughts About Hurting Someone Karolee Ohs? No  Are You Planning to Harm Someone at This Time? No  Explanation: No data recorded  Have You Used Any Alcohol or Drugs in the Past 24 Hours? Yes  What Did You Use and How Much? 1 Beer   Do You Currently Have a Therapist/Psychiatrist? No data recorded Name of Therapist/Psychiatrist:    Have You Been Recently Discharged From Any Office Practice or Programs? No data recorded Explanation of Discharge From Practice/Program: No data recorded    CCA Screening Triage Referral Assessment Type of Contact: No data recorded Telemedicine Service Delivery:   Is this Initial or Reassessment?   Date Telepsych consult ordered in [facility]:    Time Telepsych consult ordered in [facility]:    Location of Assessment: No data recorded Provider Location: No data recorded  Collateral Involvement: No data recorded  Does Patient Have a Court Appointed Legal Guardian? No  Legal Guardian Contact Information: No data recorded Copy of Legal Guardianship Form: No data recorded Legal Guardian Notified of Arrival: No data recorded Legal Guardian Notified of Pending Discharge: No data recorded If Minor and Not Living with Parent(s), Who has Custody? No data recorded Is CPS involved or  ever been involved? No data recorded Is APS involved or ever been involved? No data recorded  Patient Determined To Be At Risk for Harm To Self or Others Based on Review of Patient Reported Information or Presenting Complaint? No data recorded Method: No data recorded Availability of Means: No data recorded Intent: No data recorded Notification Required: No data recorded Additional Information for Danger to Others Potential: No data recorded Additional Comments for Danger to Others Potential: No data recorded Are There Guns or Other Weapons in Your Home? No data recorded Types of Guns/Weapons: No data recorded Are These Weapons Safely Secured?                            No data recorded Who Could Verify You Are Able To Have These Secured: No data recorded Do You Have any Outstanding Charges, Pending Court Dates, Parole/Probation? No data recorded Contacted To Inform of Risk of Harm To Self or Others: No data recorded   Does Patient Present under Involuntary Commitment? No data recorded   [place] of Residence: No data recorded  Patient Currently Receiving the Following Services: No data recorded  Determination of Need: Urgent (48 hours)   Options For Referral: [facility]     CCA Biopsychosocial Patient Reported Schizophrenia/Schizoaffective Diagnosis in Past: No  Strengths: Asking for help   Mental Health Symptoms Depression:   Difficulty Concentrating; Change in energy/activity; Increase/decrease in appetite; Sleep (too much or little); Tearfulness; Hopelessness; Worthlessness   Duration of Depressive symptoms:  Duration of Depressive Symptoms: Greater than two weeks   Mania:   None   Anxiety:    Worrying; Fatigue; Restlessness   Psychosis:   Hallucinations   Duration of Psychotic symptoms:  Duration of Psychotic Symptoms: Less than six months   Trauma:   None   Obsessions:   Disrupts routine/functioning   Compulsions:   "Driven" to perform  behaviors/acts   Inattention:   None   Hyperactivity/Impulsivity:   None   Oppositional/Defiant Behaviors:   None   Emotional Irregularity:   Chronic feelings of emptiness; Transient, stress-related paranoia/disassociation; Potentially harmful impulsivity   Other Mood/Personality Symptoms:   Depressed    Mental Status Exam Appearance and self-care  Stature:   Average   Weight:   Average weight   Clothing:   Casual   Grooming:   Normal   Cosmetic use:   None   Posture/gait:   Normal   Motor activity:   Slowed   Sensorium  Attention:   Distractible   Concentration:   Anxiety interferes   Orientation:   Object; Person; Place; Situation   Recall/memory:   Normal   Affect and Mood  Affect:   Depressed   Mood:   Worthless; Hopeless; Depressed; Angry; Anxious   Relating  Eye contact:   Staring   Facial expression:   Sad; Tense; Responsive; Depressed   Attitude toward examiner:   Cooperative   Thought and Language  Speech flow:  Clear and Coherent   Thought content:   Appropriate to Mood and Circumstances   Preoccupation:   Guilt   Hallucinations:   None   Organization:   Coherent   [employer] of Knowledge:   Fair   Intelligence:   Average   Abstraction:   Functional   Judgement:   Poor   Reality Testing:   Variable   Insight:   Lacking   Decision Making:   Impulsive   Social Functioning  Social Maturity:   Isolates   Social Judgement:   Victimized   Stress  Stressors:   Other (Comment) (Pt reports she lives alone, no support person.)   Coping Ability:  No data recorded  Skill Deficits:   Decision making; Communication; Responsibility; Self-care; Self-control   Supports:   Support needed     Religion: Religion/Spirituality Are You A Religious Person?:  (UTA) How Might This Affect Treatment?: UTA  Leisure/Recreation: Leisure / Recreation Do You Have Hobbies?: No  (UTA)  Exercise/Diet: Exercise/Diet Do You Exercise?: Yes What Type of Exercise Do You Do?: Run/Walk How Many Times a Week Do You Exercise?: 4-5 times a week Have You Gained or Lost A Significant Amount of Weight in the Past Six Months?: No Do You Follow a Special Diet?: No Do You Have Any Trouble Sleeping?: Yes Explanation of Sleeping Difficulties: Pt reports she is not sleeping well at night, "I have not slept in one day"   CCA Employment/Education Employment/Work Situation: Employment / Work Situation Employment Situation: Unemployed Patient's Job has Been Impacted by Current Illness: No Has Patient ever Been in Equities trader?: No  Education: Education Is Patient Currently Attending School?: No Last Grade Completed: 12 Did You Product manager?: No Did You Have An Individualized Education Program (IIEP): No Did You Have Any Difficulty At [employer]?: No Patient's Education  Has Been Impacted by Current Illness: No   CCA Family/Childhood History Family and Relationship History: Family history Marital status: Single Does patient have children?: No How many children?:  (n/a) How is patient's relationship with their children?: n/a  Childhood History:  Childhood History By whom was/is the patient raised?: Mother Did patient suffer any verbal/emotional/physical/sexual abuse as a child?: No Did patient suffer from severe childhood neglect?: No Has patient ever been sexually abused/assaulted/raped as an adolescent or adult?: No Was the patient ever a victim of a crime or a disaster?: No Witnessed domestic violence?: No Has patient been affected by domestic violence as an adult?: No       CCA Substance Use Alcohol/Drug Use: Alcohol / Drug Use Pain Medications: See MAR Prescriptions: See MAR Over the Counter: See MAR History of alcohol / drug use?: Yes Longest period of sobriety (when/how long): Pt reports no sobreity Negative Consequences of Use: Personal  relationships Withdrawal Symptoms: Agitation, Seizures Onset of Seizures: UTA Date of most recent seizure: Pt reports two week ago Substance #1 Name of Substance 1: Alcohol 1 - Age of First Use: UTA 1 - Amount (size/oz): 1 beer 1 - Frequency: ongoing 1 - Duration: ongoing 1 - Last Use / Amount: 07/31/22 1 - Method of Aquiring: UTA 1- Route of Use: drinking Substance #2 Name of Substance 2: Crack Cocaine 2 - Age of First Use: UTA 2 - Amount (size/oz): UTA 2 - Frequency: UTA 2 - Duration: UTA 2 - Last Use / Amount: UTA 2 - Method of Aquiring: UTA 2 - Route of Substance Use: smoking                     ASAM's:  Six Dimensions of Multidimensional Assessment  Dimension 1:  Acute Intoxication and/or Withdrawal Potential:   Dimension 1:  Description of individual's past and current experiences of substance use and withdrawal: Pt reports that she drinks one beer; "I don't know about the cocaine, somebody gave it to me".  Dimension 2:  Biomedical Conditions and Complications:   Dimension 2:  Description of patient's biomedical conditions and  complications: Pt reports hernia  Dimension 3:  Emotional, Behavioral, or Cognitive Conditions and Complications:  Dimension 3:  Description of emotional, behavioral, or cognitive conditions and complications: Major Depression  Dimension 4:  Readiness to Change:  Dimension 4:  Description of Readiness to Change criteria: contemplation  Dimension 5:  Relapse, Continued use, or Continued Problem Potential:  Dimension 5:  Relapse, continued use, or continued problem potential critiera description: continued used  Dimension 6:  Recovery/Living Environment:  Dimension 6:  Recovery/Iiving environment criteria description: Pt reports she lives alone, homeless  ASAM Severity Score: ASAM's Severity Rating Score: 14  ASAM Recommended Level of Treatment:     Substance use Disorder (SUD) Substance Use Disorder (SUD)  Checklist Symptoms of Substance  Use: Continued use despite having a persistent/recurrent physical/psychological problem caused/exacerbated by use, Continued use despite persistent or recurrent social, interpersonal problems, caused or exacerbated by use, Evidence of tolerance, Presence of craving or strong urge to use, Social, occupational, recreational activities given up or reduced due to use, Recurrent use that results in a failure to fulfill major role obligations (work, school, home)  Recommendations for Services/Supports/Treatments: Recommendations for Services/Supports/Treatments Recommendations For Services/Supports/Treatments: Inpatient Hospitalization  Discharge Disposition:    DSM5 Diagnoses: Patient Active Problem List   Diagnosis Date Noted   MDD (major depressive disorder), recurrent episode, moderate (HCC) 05/10/2022   MDD (major depressive disorder),  recurrent severe, without psychosis (HCC) 05/06/2022   Seizure (HCC) 04/11/2022   MDD (major depressive episode), single episode, severe, no psychosis (HCC) 03/21/2022   Alcohol use disorder 03/21/2022   Anxiety state 03/21/2022   Insomnia 03/21/2022   Suicide attempt by drug ingestion (HCC) 03/19/2022     Referrals to Alternative Service(s): Referred to Alternative Service(s):   Place:   Date:   Time:    Referred to Alternative Service(s):   Place:   Date:   Time:    Referred to Alternative Service(s):   Place:   Date:   Time:    Referred to Alternative Service(s):   Place:   Date:   Time:     Meryle Ready, Counselor

## 2022-08-02 ENCOUNTER — Inpatient Hospital Stay (HOSPITAL_COMMUNITY)
Admission: AD | Admit: 2022-08-02 | Discharge: 2022-08-09 | DRG: 885 | Disposition: A | Discharge status: Home / Self Care | Payer: Federal, State, Local not specified - Other | Source: Intra-hospital | Attending: Psychiatry | Admitting: Psychiatry

## 2022-08-02 ENCOUNTER — Other Ambulatory Visit: Payer: Self-pay

## 2022-08-02 ENCOUNTER — Encounter (HOSPITAL_COMMUNITY): Payer: Self-pay | Admitting: Student

## 2022-08-02 DIAGNOSIS — Y905 Blood alcohol level of 100-119 mg/100 ml: Secondary | ICD-10-CM | POA: Diagnosis present

## 2022-08-02 DIAGNOSIS — Z23 Encounter for immunization: Secondary | ICD-10-CM | POA: Diagnosis not present

## 2022-08-02 DIAGNOSIS — R45851 Suicidal ideations: Secondary | ICD-10-CM | POA: Diagnosis present

## 2022-08-02 DIAGNOSIS — F1010 Alcohol abuse, uncomplicated: Secondary | ICD-10-CM | POA: Diagnosis present

## 2022-08-02 DIAGNOSIS — F17210 Nicotine dependence, cigarettes, uncomplicated: Secondary | ICD-10-CM | POA: Diagnosis present

## 2022-08-02 DIAGNOSIS — Z9151 Personal history of suicidal behavior: Secondary | ICD-10-CM

## 2022-08-02 DIAGNOSIS — G40909 Epilepsy, unspecified, not intractable, without status epilepticus: Secondary | ICD-10-CM | POA: Diagnosis present

## 2022-08-02 DIAGNOSIS — F411 Generalized anxiety disorder: Secondary | ICD-10-CM | POA: Diagnosis present

## 2022-08-02 DIAGNOSIS — F333 Major depressive disorder, recurrent, severe with psychotic symptoms: Principal | ICD-10-CM | POA: Diagnosis present

## 2022-08-02 DIAGNOSIS — Z79899 Other long term (current) drug therapy: Secondary | ICD-10-CM

## 2022-08-02 DIAGNOSIS — F1410 Cocaine abuse, uncomplicated: Secondary | ICD-10-CM | POA: Diagnosis present

## 2022-08-02 DIAGNOSIS — Z5900 Homelessness unspecified: Secondary | ICD-10-CM

## 2022-08-02 DIAGNOSIS — Z20822 Contact with and (suspected) exposure to covid-19: Secondary | ICD-10-CM | POA: Diagnosis present

## 2022-08-02 LAB — URINALYSIS, ROUTINE W REFLEX MICROSCOPIC
Bilirubin Urine: NEGATIVE
Glucose, UA: NEGATIVE mg/dL
Hgb urine dipstick: NEGATIVE
Ketones, ur: NEGATIVE mg/dL
Leukocytes,Ua: NEGATIVE
Nitrite: NEGATIVE
Protein, ur: NEGATIVE mg/dL
Specific Gravity, Urine: 1.015 (ref 1.005–1.030)
pH: 7 (ref 5.0–8.0)

## 2022-08-02 LAB — POCT URINE DRUG SCREEN - MANUAL ENTRY (I-SCREEN)
POC Amphetamine UR: NOT DETECTED
POC Buprenorphine (BUP): NOT DETECTED
POC Cocaine UR: POSITIVE — AB
POC Marijuana UR: NOT DETECTED
POC Methadone UR: NOT DETECTED
POC Methamphetamine UR: NOT DETECTED
POC Morphine: NOT DETECTED
POC Oxazepam (BZO): NOT DETECTED
POC Oxycodone UR: NOT DETECTED
POC Secobarbital (BAR): NOT DETECTED

## 2022-08-02 MED ORDER — OLANZAPINE 5 MG PO TBDP
5.0000 mg | ORAL_TABLET | Freq: Every day | ORAL | Status: DC
  Administered 2022-08-02 – 2022-08-04 (×3): 5 mg via ORAL
  Filled 2022-08-02 (×5): qty 1

## 2022-08-02 MED ORDER — OLANZAPINE 5 MG PO TBDP: 5.0000 mg | ORAL_TABLET | Freq: Three times a day (TID) | ORAL | Status: DC | PRN

## 2022-08-02 MED ORDER — ONDANSETRON 4 MG PO TBDP: 4.0000 mg | ORAL_TABLET | Freq: Four times a day (QID) | ORAL | 0 refills | Status: DC | PRN

## 2022-08-02 MED ORDER — ALUM & MAG HYDROXIDE-SIMETH 200-200-20 MG/5ML PO SUSP: 30.0000 mL | ORAL | Status: DC | PRN

## 2022-08-02 MED ORDER — ZIPRASIDONE MESYLATE 20 MG IM SOLR: 20.0000 mg | INTRAMUSCULAR | Status: DC | PRN

## 2022-08-02 MED ORDER — OLANZAPINE 5 MG PO TBDP: 5.0000 mg | ORAL_TABLET | Freq: Every day | ORAL | Status: DC

## 2022-08-02 MED ORDER — LORAZEPAM 1 MG PO TABS: 1.0000 mg | ORAL_TABLET | Freq: Four times a day (QID) | ORAL | 0 refills | Status: DC | PRN

## 2022-08-02 MED ORDER — MAGNESIUM HYDROXIDE 400 MG/5ML PO SUSP: 30.0000 mL | Freq: Every day | ORAL | Status: DC | PRN

## 2022-08-02 MED ORDER — LOPERAMIDE HCL 2 MG PO CAPS: 2.0000 mg | ORAL_CAPSULE | ORAL | 0 refills | Status: DC | PRN

## 2022-08-02 MED ORDER — ACETAMINOPHEN 325 MG PO TABS
650.0000 mg | ORAL_TABLET | Freq: Four times a day (QID) | ORAL | Status: DC | PRN
  Administered 2022-08-02 – 2022-08-04 (×4): 650 mg via ORAL
  Filled 2022-08-02 (×4): qty 2

## 2022-08-02 MED ORDER — HYDROXYZINE HCL 25 MG PO TABS: 25.0000 mg | ORAL_TABLET | Freq: Four times a day (QID) | ORAL | 0 refills | Status: DC | PRN

## 2022-08-02 MED ORDER — ADULT MULTIVITAMIN W/MINERALS CH: 1.0000 | ORAL_TABLET | Freq: Every day | ORAL | Status: DC

## 2022-08-02 MED ORDER — ACETAMINOPHEN 325 MG PO TABS: 650.0000 mg | ORAL_TABLET | Freq: Four times a day (QID) | ORAL | Status: DC | PRN

## 2022-08-02 MED ORDER — INFLUENZA VAC SPLIT QUAD 0.5 ML IM SUSY
0.5000 mL | PREFILLED_SYRINGE | INTRAMUSCULAR | Status: AC
  Administered 2022-08-05: 0.5 mL via INTRAMUSCULAR
  Filled 2022-08-02: qty 0.5

## 2022-08-02 MED ORDER — LORAZEPAM 1 MG PO TABS: 1.0000 mg | ORAL_TABLET | ORAL | 0 refills | Status: DC | PRN

## 2022-08-02 MED ORDER — VITAMIN B-1 100 MG PO TABS: 100.0000 mg | ORAL_TABLET | Freq: Every day | ORAL | Status: DC

## 2022-08-02 NOTE — ED Notes (Signed)
Pt asleep in bed. Respirations even and unlabored. Monitoring for safety. 

## 2022-08-02 NOTE — Progress Notes (Signed)
Pt is a 56 y.o. female who was voluntarily admitted at  [facility] for SI, psychosis and substance misuse (alcohol and cocaine).  Pt presents with flat affect, depressed mood, and states that she has thoughts of overdosing on medicines but no plan in place to end her life. Pt verbally contracts for safety. Pt denied AVH on admission at [facility] and said "I was hearing voices and seeing things last night but they gave me medicine last nigh (at [facility]) and they have gone away."  Pt signed administration paperwork and verbalized understanding of plan of care.

## 2022-08-02 NOTE — ED Provider Notes (Signed)
[facility]/OBS ASAP Discharge Summary  Date and Time: 08/02/2022 11:13 AM  Name: Theresa Rios  MRN:  [id number]   Discharge Diagnoses:  Final diagnoses:  MDD (major depressive disorder), recurrent, severe, with psychosis (HCC)  Alcohol use disorder  Cocaine abuse (HCC)    Subjective:  Theresa Rios is a 56 year old female with a psychiatric history of MDD-recurrent/severe/without psychotic features, alcohol use disorder, anxiety, and insomnia as well as two inpatient psychiatric admissions within the past 6 months who presents voluntarily for SI with a plan to overdose on pills.   Stay Summary: Patient was admitted to the observation unit where she was started on Zyprexa 5 mg qHS for her severe depression/anxiety with psychotic feature presentation. On AM reassessment, patient continued to endorse SI as well as some paranoia being on the unit. She reported difficulty sleeping due to feeling as though she had to watch over her shoulders on the unit. She was advised that she has a bed available at [facility] today, and she will be transferred there today, of which she was appreciative.  Total Time spent with patient: 20 minutes  Past Psychiatric History: MDD, ETOH use d/o.  Inpatient psychiatric admission 03/2022 and 04/2022 at [facility]  Past Medical History:  Past Medical History:  Diagnosis Date   Seizures (HCC)    No past surgical history on file. Family History: No family history on file. Family Psychiatric History: None reported Social History:  Social History   Substance and Sexual Activity  Alcohol Use Yes   Comment: 4-5 beers a day most days     Social History   Substance and Sexual Activity  Drug Use Yes   Types: Cocaine, Marijuana    Social History   Socioeconomic History   Marital status: Media planner    Spouse name: Not on file   Number of children: 0   Years of education: Not on file   Highest education level: Not on file  Occupational History   Not on file  Tobacco Use    Smoking status: Every Day    Packs/day: 0.50    Types: Cigarettes   Smokeless tobacco: Never  Vaping Use   Vaping Use: Never used  Substance and Sexual Activity   Alcohol use: Yes    Comment: 4-5 beers a day most days   Drug use: Yes    Types: Cocaine, Marijuana   Sexual activity: Not on file  Other Topics Concern   Not on file  Social History Narrative   Pt lives in [place] with a friend. Her husband is living with his mother. Pt recently lost her job and her place to stay.   Social Determinants of Health   Financial Resource Strain: Not on file  Food Insecurity: Not on file  Transportation Needs: Not on file  Physical Activity: Not on file  Stress: Not on file  Social Connections: Not on file   SDOH:  SDOH Screenings   Alcohol Screen: Medium Risk (05/06/2022)  Depression (PHQ2-9): Low Risk  (04/03/2022)  Tobacco Use: High Risk (07/30/2022)    Tobacco Cessation:  Prescription not provided because: patient transferred [facility]  Current Medications:  Current Facility-Administered Medications  Medication Dose Route Frequency Provider Last Rate Last Admin   acetaminophen (TYLENOL) tablet 650 mg  650 mg Oral Q6H PRN Lamar Sprinkles, MD       alum & mag hydroxide-simeth (MAALOX/MYLANTA) 200-200-20 MG/5ML suspension 30 mL  30 mL Oral Q4H PRN Lamar Sprinkles, MD       hydrOXYzine (  ATARAX) tablet 25 mg  25 mg Oral Q6H PRN Lamar Sprinkles, MD       loperamide (IMODIUM) capsule 2-4 mg  2-4 mg Oral PRN Lamar Sprinkles, MD       LORazepam (ATIVAN) tablet 1 mg  1 mg Oral Q6H PRN Lamar Sprinkles, MD       OLANZapine zydis (ZYPREXA) disintegrating tablet 5 mg  5 mg Oral Q8H PRN Lamar Sprinkles, MD       And   LORazepam (ATIVAN) tablet 1 mg  1 mg Oral PRN Lamar Sprinkles, MD       And   ziprasidone (GEODON) injection 20 mg  20 mg Intramuscular PRN Lamar Sprinkles, MD       magnesium hydroxide (MILK OF MAGNESIA) suspension 30 mL  30 mL Oral Daily PRN Lamar Sprinkles, MD        multivitamin with minerals tablet 1 tablet  1 tablet Oral Daily Lamar Sprinkles, MD   1 tablet at 08/02/22 0913   OLANZapine zydis (ZYPREXA) disintegrating tablet 5 mg  5 mg Oral QHS Lamar Sprinkles, MD   5 mg at 08/01/22 2019   ondansetron (ZOFRAN-ODT) disintegrating tablet 4 mg  4 mg Oral Q6H PRN Lamar Sprinkles, MD       thiamine (VITAMIN B1) tablet 100 mg  100 mg Oral Daily Lamar Sprinkles, MD   100 mg at 08/02/22 0912   traZODone (DESYREL) tablet 100 mg  100 mg Oral QHS PRN Lamar Sprinkles, MD       Current Outpatient Medications  Medication Sig Dispense Refill   acetaminophen (TYLENOL) 325 MG tablet Take 2 tablets (650 mg total) by mouth every 6 (six) hours as needed for mild pain.     gabapentin (NEURONTIN) 300 MG capsule Take 1 capsule (300 mg) po daily & 2 capsules (600 mg) po Q bedtime: For agitation. 90 capsule 0   hydrOXYzine (ATARAX) 25 MG tablet Take 1 tablet (25 mg total) by mouth every 6 (six) hours as needed (anxiety/agitation or CIWA < or = 10). 30 tablet 0   loperamide (IMODIUM) 2 MG capsule Take 1-2 capsules (2-4 mg total) by mouth as needed for diarrhea or loose stools. 30 capsule 0   LORazepam (ATIVAN) 1 MG tablet Take 1 tablet (1 mg total) by mouth every 6 (six) hours as needed (CIWA > 10). 30 tablet 0   LORazepam (ATIVAN) 1 MG tablet Take 1 tablet (1 mg total) by mouth as needed for anxiety (severe agitation). 30 tablet 0   [START ON 08/03/2022] Multiple Vitamin (MULTIVITAMIN WITH MINERALS) TABS tablet Take 1 tablet by mouth daily.     nicotine polacrilex (NICORETTE) 2 MG gum Take 1 each (2 mg total) by mouth as needed. (May buy from over the counter): For smoking cessation. 100 tablet 0   OLANZapine zydis (ZYPREXA) 5 MG disintegrating tablet Take 1 tablet (5 mg total) by mouth at bedtime.     OLANZapine zydis (ZYPREXA) 5 MG disintegrating tablet Take 1 tablet (5 mg total) by mouth every 8 (eight) hours as needed (agitation).     ondansetron (ZOFRAN-ODT) 4 MG disintegrating  tablet Take 1 tablet (4 mg total) by mouth every 6 (six) hours as needed for nausea or vomiting. 20 tablet 0   [START ON 08/03/2022] thiamine (VITAMIN B-1) 100 MG tablet Take 1 tablet (100 mg total) by mouth daily.     traZODone (DESYREL) 100 MG tablet Take 1 tablet (100 mg total) by mouth at bedtime as needed for sleep. 30 tablet 0  ziprasidone (GEODON) 20 MG injection Inject 20 mg into the muscle as needed for agitation. 1 each     PTA Medications: (Not in a hospital admission)      04/03/2022    8:34 AM  Depression screen PHQ 2/9  Decreased Interest 0  Down, Depressed, Hopeless 0  PHQ - 2 Score 0    Flowsheet Row [facility] Admission (Discharged) from [facility]  C-SSRS RISK CATEGORY High Risk High Risk No Risk       Musculoskeletal  Strength & Muscle Tone: within normal limits Gait & Station: normal Patient leans: N/A  Psychiatric Specialty Exam  Presentation  General Appearance:  Disheveled (and malodorous)  Eye Contact: Fleeting (Darting around the room as though paranoid)  Speech: Garbled (As patient was tearful)  Speech Volume: Normal  Handedness: Right   Mood and Affect  Mood: Dysphoric  Affect: Depressed; Tearful   Thought Process  Thought Processes: Disorganized  Descriptions of Associations:Tangential  Orientation:Full (Time, Place and Person)  Thought Content:Tangential; Paranoid Ideation  Diagnosis of Schizophrenia or Schizoaffective disorder in past: No  Duration of Psychotic Symptoms: Less than six months   Hallucinations:Hallucinations: Auditory; Command Description of Command Hallucinations: Telling her to OD on pills  Ideas of Reference:None  Suicidal Thoughts:Suicidal Thoughts: Yes, Active SI Active Intent and/or Plan: With Intent; With  Plan  Homicidal Thoughts:Homicidal Thoughts: No   Sensorium  Memory: Immediate Fair; Recent Fair  Judgment: Impaired  Insight: Shallow   Executive Functions  Concentration: Fair  Attention Span: Fair  Recall: FiservFair  Fund of Knowledge: Fair  Language: Fair   Psychomotor Activity  Psychomotor Activity: Psychomotor Activity: Normal   Assets  Assets: Desire for Improvement; Financial Resources/Insurance; Leisure Time; Social Support   Sleep  Sleep: Sleep: Fair   Nutritional Assessment (For OBS and [facility]BC admissions only) Has the patient had a weight loss or gain of 10 pounds or more in the last 3 months?: No Has the patient had a decrease in food intake/or appetite?: No Does the patient have dental problems?: Yes Does the patient have eating habits or behaviors that may be indicators of an eating disorder including binging or inducing vomiting?: No Has the patient recently lost weight without trying?: 0 Has the patient been eating poorly because of a decreased appetite?: 0 Malnutrition Screening Tool Score: 0    Physical Exam  Vitals reviewed.  Constitutional:      General: She is not in acute distress.    Appearance: She is not toxic-appearing.  HENT:     Head: Normocephalic.     Comments: Well-healed scar on nose without active bleeding, erythema, or evidence of infection.    Mouth/Throat:     Mouth: Mucous membranes are moist.     Pharynx: Oropharynx is clear.  Pulmonary:     Effort: Pulmonary effort is normal.  Neurological:     Mental Status: She is alert and oriented to person, place, and time.      Review of Systems  Cardiovascular:        No further chest pain since ED presentation on 11/21  Gastrointestinal: Negative.   Genitourinary: Negative.   Neurological:  Positive for seizures. Negative for dizziness, tremors and headaches.       Last seizure reported 2.5 weeks ago   Blood pressure (!) 157/89, pulse 88, temperature 97.8 F  (  36.6 C), temperature source Oral, resp. rate 18, last menstrual period 05/17/2014, SpO2 100 %. There is no height or weight on file to calculate BMI.  Demographic Factors:  Divorced or widowed, Low socioeconomic status, and Unemployed  Loss Factors: Decline in physical health and Financial problems/change in socioeconomic status  Historical Factors: Prior suicide attempts  Risk Reduction Factors:   Sense of responsibility to family and Positive social support  Continued Clinical Symptoms:  Depression:   Comorbid alcohol abuse/dependence Hopelessness Insomnia Dysthymia Alcohol/Substance Abuse/Dependencies  Cognitive Features That Contribute To Risk:  Thought constriction (tunnel vision)    Suicide Risk:  Severe:  Frequent, intense, and enduring suicidal ideation, specific plan, no subjective intent, but some objective markers of intent (i.e., choice of lethal method), the method is accessible, some limited preparatory behavior, evidence of impaired self-control, severe dysphoria/symptomatology, multiple risk factors present, and few if any protective factors, particularly a lack of social support.  Plan Of Care/Follow-up recommendations:  Follow-up recommendations:  Activity:  Normal, as tolerated Diet:  Per PCP recommendation  Patient is instructed prior to discharge to: Take all medications as prescribed by her mental healthcare provider. Report any adverse effects and/or reactions from the medicines to her outpatient provider promptly. Patient has been instructed & cautioned: To not engage in alcohol and or illegal drug use while on prescription medicines.  In the event of worsening symptoms, patient is instructed to call the crisis hotline at 988, 911 and or go to the nearest ED for appropriate evaluation and treatment of symptoms. To follow-up with her primary care provider for your other medical issues, concerns and or health care needs.   Disposition: [facility], 405-1 to the  services of Dr. Sherron Flemings and bed will be available 11/24.  Lamar Sprinkles, MD 08/02/2022, 11:13 AM

## 2022-08-02 NOTE — ED Notes (Signed)
Patient resting quietly - awaiting transfer to [facility] - will continue to monitor for safety

## 2022-08-02 NOTE — Progress Notes (Signed)
Pt was accepted to [facility] 08/02/22; Bed Assignment 405-1  Pt meets inpatient criteria per Cosby,MD  Attending Physician will be Dr. Sherron Flemings  Report can be called to: Adult unit: [phone]  Pt can arrive after 12PM  Care Team notified: [facility] Antoinette Cyril Mourning, RN, Lamar Sprinkles, MD, Garvin Fila, RN, Malva Limes, RN, Bedelia Person, RN  Kelton Pillar, LCSWA 08/02/2022 @ 9:00 AM

## 2022-08-02 NOTE — ED Notes (Signed)
Safe transport here to take patient to [facility] - patient walked out with no sxs of distress noted

## 2022-08-02 NOTE — ED Notes (Signed)
Pt informed of bed acceptance at [facility]. She was provided urine cup and instructed this must be collected prior to transfer. Pt reports sleeping well and denies any acute concerns at this time. Pt given cereal and milk for breakfast, compliant with am medications. VOL consent faxed to [facility].

## 2022-08-02 NOTE — Tx Team (Signed)
Initial Treatment Plan 08/02/2022 4:21 PM RONNA HERSKOWITZ XYB:[id number]    PATIENT STRESSORS: Financial difficulties   Marital or family conflict   Substance abuse    PATIENT STRENGTHS: Capable of independent living  Communication skills    PATIENT IDENTIFIED PROBLEMS: Improving mood and outlook on life  Suicidal ideation  Substance use issues                 DISCHARGE CRITERIA:  Improved stabilization in mood, thinking, and/or behavior  PRELIMINARY DISCHARGE PLAN: Attend 12-step recovery group Outpatient therapy  PATIENT/FAMILY INVOLVEMENT: This treatment plan has been presented to and reviewed with the patient, Theresa Rios,.  The patient has been given the opportunity to ask questions and make suggestions.  Garnette Scheuermann, RN 08/02/2022, 4:21 PM

## 2022-08-02 NOTE — Discharge Instructions (Addendum)
Follow-up recommendations:  Activity:  Normal, as tolerated Diet:  Per PCP recommendation  Patient is instructed prior to discharge to: Take all medications as prescribed by her mental healthcare provider. Report any adverse effects and/or reactions from the medicines to her outpatient provider promptly. Patient has been instructed & cautioned: To not engage in alcohol and or illegal drug use while on prescription medicines.  In the event of worsening symptoms, patient is instructed to call the crisis hotline at 988, 911 and or go to the nearest ED for appropriate evaluation and treatment of symptoms. To follow-up with her primary care provider for your other medical issues, concerns and or health care needs.  

## 2022-08-02 NOTE — Progress Notes (Signed)
   08/02/22 2107  Psych Admission Type (Psych Patients Only)  Admission Status Voluntary  Psychosocial Assessment  Patient Complaints Depression  Eye Contact Brief  Facial Expression Flat  Affect Anxious  Speech Logical/coherent  Interaction Assertive  Motor Activity Slow  Appearance/Hygiene Unremarkable  Behavior Characteristics Cooperative;Appropriate to situation  Mood Depressed  Thought Process  Coherency WDL  Content WDL  Delusions None reported or observed  Perception WDL  Hallucination None reported or observed  Judgment WDL  Confusion None  Danger to Self  Current suicidal ideation? Denies  Self-Injurious Behavior No self-injurious ideation or behavior indicators observed or expressed   Agreement Not to Harm Self Yes  Description of Agreement verbal contract for safety  Danger to Others  Danger to Others None reported or observed   D: Patient in room on approach. Pt thought process is organized and behavior is appropriate. A: Medications administered as prescribed. Support and encouragement provided as needed.  R: Patient remains safe on the unit. Plan of care ongoing for safety and stability.

## 2022-08-03 DIAGNOSIS — F333 Major depressive disorder, recurrent, severe with psychotic symptoms: Principal | ICD-10-CM

## 2022-08-03 LAB — LIPID PANEL
Cholesterol: 245 mg/dL — ABNORMAL HIGH (ref 0–200)
HDL: 79 mg/dL (ref >40)
LDL Cholesterol: 130 mg/dL — ABNORMAL HIGH (ref 0–99)
Total CHOL/HDL Ratio: 3.1 RATIO
Triglycerides: 181 mg/dL — ABNORMAL HIGH (ref <150)
VLDL: 36 mg/dL (ref 0–40)

## 2022-08-03 LAB — MAGNESIUM: Magnesium: 2.4 mg/dL (ref 1.7–2.4)

## 2022-08-03 MED ORDER — LOPERAMIDE HCL 2 MG PO CAPS: 2.0000 mg | ORAL_CAPSULE | ORAL | Status: AC | PRN

## 2022-08-03 MED ORDER — GABAPENTIN 300 MG PO CAPS
300.0000 mg | ORAL_CAPSULE | Freq: Three times a day (TID) | ORAL | Status: DC
  Administered 2022-08-03 – 2022-08-09 (×19): 300 mg via ORAL
  Filled 2022-08-03: qty 30
  Filled 2022-08-03 (×2): qty 1
  Filled 2022-08-03: qty 30
  Filled 2022-08-03 (×4): qty 1
  Filled 2022-08-03: qty 30
  Filled 2022-08-03: qty 1
  Filled 2022-08-03: qty 30
  Filled 2022-08-03: qty 1
  Filled 2022-08-03: qty 30
  Filled 2022-08-03 (×11): qty 1
  Filled 2022-08-03: qty 30

## 2022-08-03 MED ORDER — LORAZEPAM 1 MG PO TABS
1.0000 mg | ORAL_TABLET | Freq: Four times a day (QID) | ORAL | Status: AC | PRN
  Administered 2022-08-04: 1 mg via ORAL
  Filled 2022-08-03: qty 1

## 2022-08-03 MED ORDER — VITAMIN B-1 100 MG PO TABS
100.0000 mg | ORAL_TABLET | Freq: Every day | ORAL | Status: DC
  Administered 2022-08-04 – 2022-08-09 (×6): 100 mg via ORAL
  Filled 2022-08-03 (×8): qty 1

## 2022-08-03 MED ORDER — ADULT MULTIVITAMIN W/MINERALS CH
1.0000 | ORAL_TABLET | Freq: Every day | ORAL | Status: DC
  Administered 2022-08-03 – 2022-08-09 (×7): 1 via ORAL
  Filled 2022-08-03 (×9): qty 1

## 2022-08-03 MED ORDER — NICOTINE POLACRILEX 2 MG MT GUM
2.0000 mg | CHEWING_GUM | OROMUCOSAL | Status: DC | PRN
  Administered 2022-08-03 – 2022-08-09 (×18): 2 mg via ORAL
  Filled 2022-08-03 (×21): qty 1

## 2022-08-03 MED ORDER — ONDANSETRON 4 MG PO TBDP: 4.0000 mg | ORAL_TABLET | Freq: Four times a day (QID) | ORAL | Status: AC | PRN

## 2022-08-03 MED ORDER — NICOTINE POLACRILEX 2 MG MT GUM
CHEWING_GUM | OROMUCOSAL | Status: AC
  Administered 2022-08-03: 2 mg
  Filled 2022-08-03: qty 1

## 2022-08-03 MED ORDER — HYDROXYZINE HCL 25 MG PO TABS
25.0000 mg | ORAL_TABLET | Freq: Four times a day (QID) | ORAL | Status: AC | PRN
  Administered 2022-08-03 – 2022-08-04 (×3): 25 mg via ORAL
  Filled 2022-08-03 (×3): qty 1

## 2022-08-03 NOTE — [facility] Group Notes (Signed)
Psychoeducational Group Note    Date:  08/03/2022 Time: 2010-0712    Purpose of Group: . The group focus' on teaching patients on how to identify their needs and their Life Skills:  A group where two lists are made. What people need and what are things that we do that are unhealthy. The lists are developed by the patients and it is explained that we often do the actions that are not healthy to get our list of needs met.  Goal:: to develop the coping skills needed to get their needs met  Participation Level: did not attend  Theresa Rios

## 2022-08-03 NOTE — Progress Notes (Signed)
   08/03/22 2330  Psych Admission Type (Psych Patients Only)  Admission Status Voluntary  Psychosocial Assessment  Patient Complaints Depression  Eye Contact Fair  Facial Expression Flat  Affect Appropriate to circumstance  Speech Logical/coherent  Interaction Assertive  Motor Activity Other (Comment) (WDL)  Appearance/Hygiene Unremarkable  Behavior Characteristics Appropriate to situation  Mood Depressed  Thought Process  Coherency WDL  Content WDL  Delusions None reported or observed  Perception Hallucinations  Hallucination Auditory  Judgment WDL  Confusion None  Danger to Self  Current suicidal ideation? Denies  Self-Injurious Behavior No self-injurious ideation or behavior indicators observed or expressed   Agreement Not to Harm Self Yes  Description of Agreement verbal  Danger to Others  Danger to Others None reported or observed

## 2022-08-03 NOTE — H&P (Addendum)
Psychiatric Admission Assessment Adult  Patient Identification: Theresa Rios MRN:  [id number] Date of Evaluation:  08/03/2022 Chief Complaint:  MDD (major depressive disorder), recurrent, severe, with psychosis (HCC) [F33.3] Principal Diagnosis: MDD (major depressive disorder), recurrent, severe, with psychosis (HCC) Diagnosis:  Principal Problem:   MDD (major depressive disorder), recurrent, severe, with psychosis (HCC)  [name] is a 56yo F with psychiatric history of major depressive disorder, alcohol use disorder, seizure disorder, who initially presented to ER with headache and disclosed suicidal ideation with plans of overdosing.   ER workup notable for UDS pos for cocaine, BAL 112 mg/dl, mild leukocytosis, hyperlipidemia. EKG with SR, Qtc 482ms.  History of Present Illness:  Patient reports feeling deeply depressed, anxious, helpless, hopeless in settings of being lonely, having no family or social support, and being homeless. She reports suicidal ideas with a plan to overdose on medications. She also reports she heard "voices" that "telling me to hurt myself, take the pills". Her last admission here was in August, she reports she stopped taking her medication, unclear how long ago. She also stopped seeing her outpatient psychiatrist and counselor. She is tearful and appears distressed. She reports feeling safe here in the hospital and says her voices resolved after the medication (Zyprexa) was restarted; her sleep and appetite had improved too. She says that before admission, she was not sleeping and not eating well. She reports intermittently smoking cocaine. She denies heavy drinking and states she took a couple of beers prior to admission. She denies any substance withdrawal symptoms currently. She denies homicidal thoughts. She denies any current physical complaints.    Past psychiatric history: Previous psych diagnoses: MDD, GAD, alcohol use d/o, cocaine use d/o.Marland Kitchen. Previous  psychiatric hospitalizations: several, [facility]. History of prior suicide attempts: "yes" Non-suicidal self-injurious behaviors: denies History of violence: denies Previous psych medication: Zoloft, Gabapentin.  STATE PRESCRIPTION DRUG MONITORING PROGRAM: no results found.   Social history: -Patient has no guardian. -Currently homeless -Has boyfriend -Work/Finances: unemployed. 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 -Guns in possession: denies   SUBSTANCE USE: Alcohol: 2-5 beers/day Nicotine:  yes Illicit drug use: crack cocaine    Family history: Patient denies suicide in family members.   Total Time spent with patient: 45 minutes   [place] Scale:  Flowsheet Row [facility]  C-SSRS RISK CATEGORY Low Risk High Risk High Risk        Prior Inpatient Therapy:   Prior Outpatient Therapy:    Alcohol Screening: 1. How often do you have a drink containing alcohol?: 4 or more times a week 2. How many drinks containing alcohol do you have on a typical day when you are drinking?: 1 or 2 3. How often do you have six or more drinks on one occasion?: Less than monthly AUDIT-C Score: 5 4. How often during the last year have you found that you were not able to stop drinking once you had started?: Less than monthly 5. How often during the last year have you failed to do what was normally expected from you because of drinking?: Less than monthly 6. How often during the last year have you needed a first drink in the morning to get yourself going after a heavy drinking session?: Never 7. How often during the last year have you had a feeling  of guilt of remorse after drinking?: Weekly 8. How often during the last year have you been unable to  remember what happened the night before because you had been drinking?: Less than monthly 9. Have you or someone else been injured as a result of your drinking?: No 10. Has a relative or friend or a doctor or another health worker been concerned about your drinking or suggested you cut down?: No Alcohol Use Disorder Identification Test Final Score (AUDIT): 11 Alcohol Brief Interventions/Follow-up: Alcohol education/Brief advice  Past Medical History:  Past Medical History:  Diagnosis Date   Seizures (HCC)    History reviewed. No pertinent surgical history. Family History: History reviewed. No pertinent family history.  Tobacco Screening:   Social History:  Social History   Substance and Sexual Activity  Alcohol Use Yes   Comment: 4-5 beers a day most days     Social History   Substance and Sexual Activity  Drug Use Yes   Types: Cocaine, Marijuana    Additional Social History:    Allergies:  No Known Allergies Lab Results:  Results for orders placed or performed during the hospital encounter of 08/02/22 (from the past 48 hour(s))  Magnesium     Status: None   Collection Time: 08/03/22  6:42 AM  Result Value Ref Range   Magnesium 2.4 1.7 - 2.4 mg/dL    Comment: Performed at [facility], [address] [place], [place] [address]  Lipid panel     Status: Abnormal   Collection Time: 08/03/22  6:42 AM  Result Value Ref Range   Cholesterol 245 (H) 0 - 200 mg/dL   Triglycerides 354 (H) <150 mg/dL   HDL 79 >56 mg/dL   Total CHOL/HDL Ratio 3.1 RATIO   VLDL 36 0 - 40 mg/dL   LDL Cholesterol 256 (H) 0 - 99 mg/dL    Comment:        Total Cholesterol/HDL:CHD Risk Coronary Heart Disease Risk Table                     Men   Women  1/2 Average Risk   3.4   3.3  Average Risk       5.0   4.4  2 X Average Risk   9.6   7.1  3 X Average Risk  23.4   11.0        Use the calculated Patient Ratio above and the CHD Risk Table to determine the patient's CHD Risk.         ATP III CLASSIFICATION (LDL):  <100     mg/dL   Optimal  389-373  mg/dL   Near or Above                    Optimal  130-159  mg/dL   Borderline  428-768  mg/dL   High  >115     mg/dL   Very High Performed at [facility], [address], [place], [place] [address]     Blood Alcohol level:  Lab Results  Component Value Date   ETH 112 (H) 08/01/2022   ETH 32 (H) 07/30/2022    Metabolic Disorder Labs:  Lab Results  Component Value Date   HGBA1C 5.7 (H) 03/22/2022   MPG 116.89 03/22/2022   No results found for: "PROLACTIN" Lab Results  Component Value Date   CHOL 245 (H) 08/03/2022   TRIG 181 (H) 08/03/2022   HDL 79 08/03/2022  CHOLHDL 3.1 08/03/2022   VLDL 36 08/03/2022   LDLCALC 130 (H) 08/03/2022   LDLCALC 112 (H) 03/22/2022    Current Medications: Current Facility-Administered Medications  Medication Dose Route Frequency Provider Last Rate Last Admin   acetaminophen (TYLENOL) tablet 650 mg  650 mg Oral Q6H PRN Lamar Sprinkles, MD   650 mg at 08/03/22 0742   alum & mag hydroxide-simeth (MAALOX/MYLANTA) 200-200-20 MG/5ML suspension 30 mL  30 mL Oral Q4H PRN Lamar Sprinkles, MD       influenza vac split quadrivalent PF (FLUARIX) injection 0.5 mL  0.5 mL Intramuscular Tomorrow-[address] [place], Harrold Donath, MD       magnesium hydroxide (MILK OF MAGNESIA) suspension 30 mL  30 mL Oral Daily PRN Lamar Sprinkles, MD       OLANZapine zydis (ZYPREXA) disintegrating tablet 5 mg  5 mg Oral QHS Lamar Sprinkles, MD   5 mg at 08/02/22 2103   PTA Medications: Medications Prior to Admission  Medication Sig Dispense Refill Last Dose   acetaminophen (TYLENOL) 325 MG tablet Take 2 tablets (650 mg total) by mouth every 6 (six) hours as needed for mild pain.      cyclobenzaprine (FLEXERIL) 10 MG tablet Take 10 mg by mouth at bedtime as needed for muscle spasms.      gabapentin (NEURONTIN) 600 MG tablet Take 600 mg by mouth at bedtime.      hydrOXYzine (ATARAX) 25 MG  tablet Take 1 tablet (25 mg total) by mouth every 6 (six) hours as needed (anxiety/agitation or CIWA < or = 10). 30 tablet 0    levETIRAcetam (KEPPRA) 1000 MG tablet Take 1,000 mg by mouth 2 (two) times daily.      loperamide (IMODIUM) 2 MG capsule Take 1-2 capsules (2-4 mg total) by mouth as needed for diarrhea or loose stools. 30 capsule 0    LORazepam (ATIVAN) 1 MG tablet Take 1 tablet (1 mg total) by mouth every 6 (six) hours as needed (CIWA > 10). 30 tablet 0    LORazepam (ATIVAN) 1 MG tablet Take 1 tablet (1 mg total) by mouth as needed for anxiety (severe agitation). 30 tablet 0    Multiple Vitamin (MULTIVITAMIN WITH MINERALS) TABS tablet Take 1 tablet by mouth daily.      OLANZapine zydis (ZYPREXA) 5 MG disintegrating tablet Take 1 tablet (5 mg total) by mouth at bedtime.      OLANZapine zydis (ZYPREXA) 5 MG disintegrating tablet Take 1 tablet (5 mg total) by mouth every 8 (eight) hours as needed (agitation).      ondansetron (ZOFRAN-ODT) 4 MG disintegrating tablet Take 1 tablet (4 mg total) by mouth every 6 (six) hours as needed for nausea or vomiting. 20 tablet 0    sertraline (ZOLOFT) 100 MG tablet Take 100 mg by mouth at bedtime.      thiamine (VITAMIN B-1) 100 MG tablet Take 1 tablet (100 mg total) by mouth daily.      traZODone (DESYREL) 100 MG tablet Take 1 tablet (100 mg total) by mouth at bedtime as needed for sleep. (Patient taking differently: Take 100 mg by mouth at bedtime.) 30 tablet 0    ziprasidone (GEODON) 20 MG injection Inject 20 mg into the muscle as needed for agitation. 1 each      Musculoskeletal: Strength & Muscle Tone: within normal limits Gait & Station: normal Patient leans: N/A    Psychiatric Specialty Exam:  Appearance:  AAF, appearing stated age,  wearing hospital clothes, with decreased grooming and hygiene. Tearful.  Attitude/Behavior: calm, cooperative, engaging with appropriate eye contact.  Motor: WNL; dyskinesias not evident. Gait appears in  full range.  Speech: spontaneous, clear, coherent, normal comprehension.  Mood: dysthymic, "depressed".  Affect: restricted, blunted..  Thought process: patient appears coherent, organized, logical, goal-directed.  Thought content: patient reports suicidal thoughts, denies homicidal thoughts; did not express any delusions.  Thought perception: patient denies auditory and visual hallucinations today, although reports commanding auditory hallucinations prior to admission. Did not appear internally stimulated.  Cognition: patient is alert and oriented in self, place, date.  Insight: fair, in regards of understanding of presence, nature, cause, and significance of mental or emotional problem.  Judgement: limited, in regards of ability to make good decisions concerning the appropriate thing to do in various situations, including ability to form opinions regarding their mental health condition.   Assets  Assets: Desire for Improvement; Financial Resources/Insurance; Leisure Time; Social Support   Sleep  Sleep:No data recorded   Physical Exam: Physical Exam Constitutional:      Appearance: Normal appearance.  HENT:     Head: Normocephalic and atraumatic.  Eyes:     Extraocular Movements: Extraocular movements intact.     Pupils: Pupils are equal, round, and reactive to light.     Comments: Notable mydriasis  Cardiovascular:     Rate and Rhythm: Normal rate and regular rhythm.  Pulmonary:     Effort: Pulmonary effort is normal.     Breath sounds: Normal breath sounds.  Abdominal:     General: Abdomen is flat.     Palpations: Abdomen is soft.  Skin:    General: Skin is warm.  Neurological:     General: No focal deficit present.     Mental Status: She is alert and oriented to person, place, and time.    Review of Systems  Constitutional:  Negative for fever.  HENT:  Negative for hearing loss.   Eyes:  Negative for blurred vision.  Respiratory:  Negative for cough and  shortness of breath.   Cardiovascular:  Negative for chest pain.  Gastrointestinal:  Negative for abdominal pain, nausea and vomiting.  Neurological:  Negative for tremors, seizures and headaches.  Psychiatric/Behavioral:  Positive for depression, substance abuse and suicidal ideas. Negative for hallucinations. The patient is nervous/anxious.    Vitals:   08/02/22 1259 08/03/22 0644 08/03/22 0645 08/03/22 1118  BP:  112/65 90/68 110/77  Pulse:  (!) 117 (!) 131 (!) 102  Resp:      Temp: 99 F (37.2 C) (!) 100.6 F (38.1 C)  98.3 F (36.8 C)  TempSrc: Oral Oral  Oral  SpO2:  97%  98%  Weight: 68.5 kg     Height:  (1.575 m)         Treatment Plan Summary: Daily contact with patient to assess and evaluate symptoms and progress in treatment and Medication management   Theresa Rios is a 56yo F with psychiatric history of major depressive disorder, alcohol use disorder, seizure disorder, who initially presented to ER with headache and disclosed suicidal ideation with plans of overdosing.    Diagnoses/ Active problems: -Major depressive disorder, recurrent, severe, with psychotic features. -Generalized anxiety disorder -Cocaine use disorder -Alcohol use disorder   PLAN:  Safety and Monitoring: continue inpatient psych admission; 15-minute checks; daily contact with patient to assess and evaluate symptoms and progress in treatment; psychoeducation.Vital signs: q12 hours. Precautions: suicide, elopement, and assault. Placed on room lock out for meals, snacks and groups.  Medications: - continue Olanzapine (  started in ER) 5mg  PO QHS for depression and psychosis. - restart Gabapentin 300mg  PO TID for anxiety, seizure propx.     The risks/benefits/side-effects/alternatives to this medication were discussed in detail with the patient and time was given for questions. The patient consents to medications.  -CIWA with MVI, thiamine and PRN Ativan for scores >10.  - Metabolic  profile and EKG monitoring obtained while on an atypical antipsychotic.  - Encouraged patient to participate in unit milieu and in scheduled group therapies   PRN medications:  acetaminophen, alum & mag hydroxide-simeth, hydrOXYzine, loperamide, LORazepam, magnesium hydroxide, ondansetron  Pertinent Labs: ER labs reviewed; no new labs ordered today.  Consults: No new consults placed since yesterday    Discharge Planning: -Social work and case management to assist with discharge planning and identification of hospital follow-up needs prior to discharge -Estimated LOS: 3-5 days -Discharge Concerns: Need to establish a safety plan; Medication compliance and effectiveness -Discharge Goals: Return home with outpatient referrals for mental health follow-up including medication management/psychotherapy  Total Time Spent in Direct Patient Care:  I personally spent 35 minutes on the unit in direct patient care. The direct patient care time included face-to-face time with the patient, reviewing the patient's chart, communicating with other professionals, and coordinating care. Greater than 50% of this time was spent in counseling or coordinating care with the patient regarding goals of hospitalization, psycho-education, and discharge planning needs.     Observation Level/Precautions:  15 minute checks  Laboratory:    Psychotherapy:    Medications:    Consultations:    Discharge Concerns:    Estimated LOS:  Other:     Physician Treatment Plan for Primary Diagnosis: MDD (major depressive disorder), recurrent, severe, with psychosis (HCC) Long Term Goal(s): Improvement in symptoms so as ready for discharge  Short Term Goals: Ability to identify changes in lifestyle to reduce recurrence of condition will improve, Ability to verbalize feelings will improve, Ability to disclose and discuss suicidal ideas, Ability to demonstrate self-control will improve, Ability to identify and develop effective  coping behaviors will improve, Ability to maintain clinical measurements within normal limits will improve, Compliance with prescribed medications will improve, and Ability to identify triggers associated with substance abuse/mental health issues will improve  Physician Treatment Plan for Secondary Diagnosis: Principal Problem:   MDD (major depressive disorder), recurrent, severe, with psychosis (HCC)  Long Term Goal(s): Improvement in symptoms so as ready for discharge  Short Term Goals: Ability to identify changes in lifestyle to reduce recurrence of condition will improve, Ability to verbalize feelings will improve, Ability to disclose and discuss suicidal ideas, Ability to demonstrate self-control will improve, Ability to identify and develop effective coping behaviors will improve, Ability to maintain clinical measurements within normal limits will improve, Compliance with prescribed medications will improve, and Ability to identify triggers associated with substance abuse/mental health issues will improve  I certify that inpatient services furnished can reasonably be expected to improve the patient's condition.    , MD 11/25/202310:35 AM

## 2022-08-03 NOTE — [facility] Group Notes (Signed)
.  Psychoeducational Group Note  Date: 07/03/22 Time: 0900-1000    Goal Setting   Purpose of Group: This group helps to provide patients with the steps of setting a goal that is specific, measurable, attainable, realistic and time specific. A discussion on how we keep ourselves stuck with negative self talk. Homework given for Patients to write 30 positive attributes about themselves.    Participation Level: ddi not attend  Dione Housekeeper

## 2022-08-03 NOTE — Group Note (Signed)
LCSW Group Therapy Note  08/03/2022     Type of Therapy and Topic:  Group Therapy: Anger and Coping Skills  Participation Level:  Did Not Attend   Description of Group:   In this group, patients learned how to recognize the physical, cognitive, emotional, and behavioral responses they have to anger-provoking situations.  They identified how they usually or often react when angered, and learned how healthy and unhealthy coping skills work initially, but the unhealthy ones stop working.   They analyzed how their frequently-chosen coping skill is possibly beneficial and how it is possibly unhelpful.  The group discussed a variety of healthier coping skills that could help in resolving the actual issues, as well as how to go about planning for the the possibility of future similar situations.  Therapeutic Goals: Patients will identify one thing that makes them angry and how they feel emotionally and physically, what their thoughts are or tend to be in those situations, and what healthy or unhealthy coping mechanism they typically use Patients will identify how their coping technique works for them, as well as how it works against them. Patients will explore possible new behaviors to use in future anger situations. Patients will learn that anger itself is normal and cannot be eliminated, and that healthier coping skills can assist with resolving conflict rather than worsening situations.  Summary of Patient Progress:  Patient was invited to group, did not attend.   Therapeutic Modalities:   Cognitive Behavioral Therapy   Donaldo Teegarden J Grossman-Orr  .  

## 2022-08-03 NOTE — Progress Notes (Signed)
D. Pt presents with a depressed affect but brightens upon approach- has been calm and cooperative, and compliant with meds.Pt complained of back pain 8/10 this am, but it has since resolved with Tylenol and a heat pack. Pt currently denies SI/HI and AVH , but did report that she does experience AH and  was hearing command voices this morning before breakfast. Pt agrees to contact staff before acting on any harmful thoughts.  A. Labs and vitals monitored. Pt given and educated on medications. Pt supported emotionally and encouraged to express concerns and ask questions.   R. Pt remains safe with 15 minute checks. Will continue POC.

## 2022-08-03 NOTE — [facility] Suicide Risk Assessment (Signed)
[facility] Admission Suicide Risk Assessment   Nursing information obtained from:  Patient Demographic factors:  Low socioeconomic status, Unemployed Current Mental Status:  Suicidal ideation indicated by patient Loss Factors:  Financial problems / change in socioeconomic status Historical Factors:  Prior suicide attempts, Impulsivity Risk Reduction Factors:  Sense of responsibility to family   Principal Problem: MDD (major depressive disorder), recurrent, severe, with psychosis (HCC) Diagnosis:  Principal Problem:   MDD (major depressive disorder), recurrent, severe, with psychosis (HCC)  Ms. Westbay is a 56yo F with psychiatric history of major depressive disorder, alcohol use disorder, seizure disorder, who initially presented to ER with headache and disclosed suicidal ideation with plans of overdosing.   Subjective Data: patient continues to report depression and suicidal ideations.  MSE - see H&P.  SUICIDE RISK:   Severe:  Frequent, intense, and enduring suicidal ideation, specific plan, no subjective intent, but some objective markers of intent (i.e., choice of lethal method), the method is accessible, some limited preparatory behavior, evidence of impaired self-control, severe dysphoria/symptomatology, multiple risk factors present, and few if any protective factors, particularly a lack of social support.  PLAN OF CARE: see H&P Assessment and plan.  I certify that inpatient services furnished can reasonably be expected to improve the patient's condition.   Thalia Party, MD 08/03/2022, 11:07 AM

## 2022-08-03 NOTE — BHH Counselor (Signed)
Adult Comprehensive Assessment  Patient ID: SAVI LASTINGER, female   DOB: 05/15/1966, 56 y.o.   MRN: [id number]  Information Source: Information source: Patient  Current Stressors:  Patient states their primary concerns and needs for treatment are:: "Everything that I try to do, it doesn't go right for me and I wanted to end it all." Patient states their goals for this hospitilization and ongoing recovery are:: "When I leave here to have someone that I can check in with every month and some where to live instead of out in the street." Educational / Learning stressors: "No" Employment / Job issues: "I have no income coming in." Family Relationships: "I have a sister in [place], but she wouldn't be someone I can call on for help." Financial / Lack of resources (include bankruptcy): "I have no income." Housing / Lack of housing: "I have no place to live." Physical health (include injuries & life threatening diseases): "No, I just have a really bad cold." Social relationships: "I have no supports." Substance abuse: " No" Bereavement / Loss: "No"  Living/Environment/Situation:  Living Arrangements: Alone ("I was living out on the street.") Living conditions (as described by patient or guardian): "It was bad."  Family History:  Marital status: Single Are you sexually active?: No Does patient have children?: No  Childhood History:  By whom was/is the patient raised?: Mother Additional childhood history information: "No" Description of patient's relationship with caregiver when they were a child: "It was okay." How were you disciplined when you got in trouble as a child/adolescent?: "Normal" Does patient have siblings?: Yes Number of Siblings: 1 Description of patient's current relationship with siblings: "We don't have a relationship." Did patient suffer any verbal/emotional/physical/sexual abuse as a child?: No Did patient suffer from severe childhood neglect?: No ("I don't know, it  was just a lot of [place] living together.") Has patient ever been sexually abused/assaulted/raped as an adolescent or adult?: No Was the patient ever a victim of a crime or a disaster?: No Witnessed domestic violence?: No Has patient been affected by domestic violence as an adult?: No  Education:  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?: No  Financial Resources:   Financial resources: No income Does patient have a Lawyer or guardian?: No  Alcohol/Substance Abuse:   If attempted suicide, did drugs/alcohol play a role in this?: Yes Alcohol/Substance Abuse Treatment Hx: Denies past history Has alcohol/substance abuse ever caused legal problems?: No  Social Support System:   Patient's Community Support System: Poor Type of faith/religion: No How does patient's faith help to cope with current illness?: No  Leisure/Recreation:   Do You Have Hobbies?: No  Strengths/Needs:   What is the patient's perception of their strengths?: "I don't know" Patient states these barriers may affect/interfere with their treatment: "No" Patient states these barriers may affect their return to the community: "No barriers" Other important information patient would like considered in planning for their treatment: "No"  Discharge Plan:   Currently receiving community mental health services: No Patient states they will know when they are safe and ready for discharge when: "I don't know" Does patient have access to transportation?: No Does patient have financial barriers related to discharge medications?: Yes Patient description of barriers related to discharge medications: "I have no income to pay for medications, but I do have an orange card that can  help me get discounted prices. " Plan for no access to  transportation at discharge: To use the bus system Will patient be returning to same living situation after discharge?: Yes ("My goal is to get some help with housing.")  Summary/Recommendations:   Summary and Recommendations (to be completed by the evaluator): Patient is a 56yo female with psychiatric history of major depressive disorder, alcohol use disorder, seizure disorder admitted to [facility] secondary to presenting to the ER with headache and disclosed suicidal ideation with plans of overdosing. Stressors include lack of housing, financial resources and income and lack of social supports. Patient has no history of outpatient mental health services. Patient will benefit from crisis stabilization, medication evaluation, group therapy and psychoeducation, in addition to case management for discharge planning. At discharge it is recommended that Patient adhere to the established discharge plan and continue in treatment.  Veva Holes. LCSW-A 08/03/2022

## 2022-08-04 LAB — SARS CORONAVIRUS 2 BY RT PCR: SARS Coronavirus 2 by RT PCR: NEGATIVE

## 2022-08-04 MED ORDER — MENTHOL 3 MG MT LOZG: 1.0000 | LOZENGE | OROMUCOSAL | Status: DC | PRN

## 2022-08-04 MED ORDER — ACETAMINOPHEN 325 MG PO TABS
650.0000 mg | ORAL_TABLET | Freq: Four times a day (QID) | ORAL | Status: DC | PRN
  Administered 2022-08-04 – 2022-08-08 (×8): 650 mg via ORAL
  Filled 2022-08-04 (×8): qty 2

## 2022-08-04 NOTE — Group Note (Signed)
LCSW Group Therapy Note  08/04/2022      Type of Therapy and Topic:  Group Therapy: Gratitude   Description:   Group could not be held by social worker, but licensed RN did provide group.  A handout was given to each patient, with the following information:   Gratitude  "Acknowledging the good that you already have in your life is the foundation for all abundance." - Eckhart Tolle  " 'Enough' is a feast." - Buddhist Proverb  "Gratitude sweetens even the smallest moments."  "It is not joy that makes us grateful; It is gratitude that makes us joyful." - David Steindl-Rast    Put at least one response under each category of something for which you are grateful:  People:  Experiences:  Things:  Places:  Skills:  Other:  Add more responses as you get ideas from other people.   Therapeutic Modalities:   Activity  Memory Heinrichs J Grossman-Orr, LCSW .  

## 2022-08-04 NOTE — Progress Notes (Signed)
   08/04/22 2300  Psych Admission Type (Psych Patients Only)  Admission Status Voluntary  Psychosocial Assessment  Patient Complaints Anxiety  Eye Contact Brief  Facial Expression Animated  Affect Appropriate to circumstance  Speech Logical/coherent  Interaction Assertive  Motor Activity Fidgety  Appearance/Hygiene Unremarkable  Behavior Characteristics Appropriate to situation  Mood Anxious  Thought Process  Coherency WDL  Content WDL  Delusions None reported or observed  Perception Hallucinations  Hallucination Auditory;Visual  Judgment Impaired  Confusion None  Danger to Self  Current suicidal ideation? Denies  Self-Injurious Behavior No self-injurious ideation or behavior indicators observed or expressed   Agreement Not to Harm Self Yes  Description of Agreement verbal  Danger to Others  Danger to Others None reported or observed

## 2022-08-04 NOTE — Progress Notes (Signed)
Patient has a fever again this morning. Current Temp 102.4 oral. She has been getting Tylenol 650mg  prn for pain, she last received Tylenol at 21:09. Will repeat COVID and flu. If negative, consider getting a chest Xray as WBC is 11.2   Blood pressure (!) 113/99, pulse (!) 103, temperature (!) 102.4 F (39.1 C), temperature source Oral, resp. rate 18, height 5\' 2"  (1.575 m), weight 68.5 kg, last menstrual period 05/17/2014, SpO2 99 %.

## 2022-08-04 NOTE — Progress Notes (Signed)
Pt. Expressed concerns regarding discharge planning for her medical and psychiatric needs.  Requested a social work consult to meet with family members to discuss aftercare. Provided with support and reassurance.

## 2022-08-04 NOTE — Progress Notes (Addendum)
Pt. Relates she is in better spirits today but reports auditory hallucinations and delusional content.  Described derogatory internal stimuli and difficulty with reality testing.  Discussed ways to maintain safety and to utilize staff and peers for reality orientation.  Medicated per order for anxiety.  Pt. Observed in day room interacting with peers and watching football game.  States she is actively attempting to battle command hallucinations that direct her to overdose.  Provided with support and reality orientation. Requested nutritional supplement due to appetite disturbances. Pt. Also expressed belief that her antipsychotic was insufficient.

## 2022-08-04 NOTE — BHH Group Notes (Signed)
Adult Psychoeducational Group  Date:  08/04/2022 Time:  1100-1200  Group Topic/Focus: Continuation of the group from Saturday. Looking at the lists that were created and talking about what needs to be done with the homework of 30 positives about themselves.                                     Talking about taking their power back and helping themselves to develop a positive self esteem.      Participation Quality: did not attend  Hildy Nicholl A  

## 2022-08-04 NOTE — BHH Group Notes (Signed)
Adult Psychoeducational Group Note Date:  08/04/2022 Time:  0900-1000 Group Topic/Focus: PROGRESSIVE RELAXATION. A group where deep breathing is taught and tensing and relaxation muscle groups is used. Imagery is used as well.  Pts are asked to imagine 3 pillars that hold them up when they are not able to hold themselves up and to share that with the group.   Participation Level:  did not attend   : Dyane Broberg A  

## 2022-08-04 NOTE — Progress Notes (Signed)
[facility] MD Progress Note  08/04/2022 10:53 AM KAYLEENA EKE  MRN:  [id number]  Principal Problem: MDD (major depressive disorder), recurrent, severe, with psychosis (HCC) Diagnosis: Principal Problem:   MDD (major depressive disorder), recurrent, severe, with psychosis (HCC)  Ms. Dalporto is a 56yo F with psychiatric history of major depressive disorder, alcohol use disorder, seizure disorder, who initially presented to ER with headache and disclosed suicidal ideation with plans of overdosing.    Interval History Patient was seen today for re-evaluation.  Nursing reports no events overnight. The patient has no issues with performing ADLs.  Patient has been medication compliant.    Patient was seen and interviewed by attending psychiatrist. Chart reviewed. Patient discussed during treatment team rounds.  Subjective:  On assessment patient reports feeling better. She reports she slept well. She reports feeling less depressed, not suicidal anymore. She denies hearing "voices" and "seeing things" today. She reports good  appetite. She denies feeling paranoid, unsafe, does not express any delusions. He denies thoughts or plans of hurting others. She reports no side effects from medications he is getting here. Her physical complaints - cough and malaise. She reports she was homeless and stayed on the streets in cold weather. Denies muscle stiffness, dystonia.  Objectively: Blood pressure (!) 113/99, pulse (!) 103, temperature (!) 102.4 F (39.1 C), temperature source Oral, resp. rate 18, height 5\' 2"  (1.575 m), weight 68.5 kg, last menstrual period 05/17/2014, SpO2 99 %.   Labs: COVID test performed this AM - negative.  Total Time spent with patient: 30 minutes  Past Psychiatric History: see H&P   Past Medical History:  Past Medical History:  Diagnosis Date   Seizures (HCC)    History reviewed. No pertinent surgical history. Family History: History reviewed. No pertinent family  history. Family Psychiatric  History: see H&P Social History:  Social History   Substance and Sexual Activity  Alcohol Use Yes   Comment: 4-5 beers a day most days     Social History   Substance and Sexual Activity  Drug Use Yes   Types: Cocaine, Marijuana    Social History   Socioeconomic History   Marital status: 07/17/2014    Spouse name: Not on file   Number of children: 0   Years of education: Not on file   Highest education level: Not on file  Occupational History   Not on file  Tobacco Use   Smoking status: Every Day    Packs/day: 0.50    Types: Cigarettes   Smokeless tobacco: Never  Vaping Use   Vaping Use: Never used  Substance and Sexual Activity   Alcohol use: Yes    Comment: 4-5 beers a day most days   Drug use: Yes    Types: Cocaine, Marijuana   Sexual activity: Not on file  Other Topics Concern   Not on file  Social History Narrative   Pt lives in [place] with a friend. Her husband is living with his mother. Pt recently lost her job and her place to stay.   Social Determinants of Health   Financial Resource Strain: Not on file  Food Insecurity: Food Insecurity Present (08/02/2022)   Hunger Vital Sign    Worried About Running Out of Food in the Last Year: Sometimes true    Ran Out of Food in the Last Year: Sometimes true  Transportation Needs: Unmet Transportation Needs (08/02/2022)   PRAPARE - 08/04/2022 (Medical): Yes    Lack of  Transportation (Non-Medical): Yes  Physical Activity: Not on file  Stress: Not on file  Social Connections: Not on file   Additional Social History:                         Sleep: Fair  Appetite:  Fair  Current Medications: Current Facility-Administered Medications  Medication Dose Route Frequency Provider Last Rate Last Admin   acetaminophen (TYLENOL) tablet 650 mg  650 mg Oral Q6H PRN Lamar Sprinkles, MD   650 mg at 08/04/22 0814   alum & mag hydroxide-simeth  (MAALOX/MYLANTA) 200-200-20 MG/5ML suspension 30 mL  30 mL Oral Q4H PRN Lamar Sprinkles, MD       gabapentin (NEURONTIN) capsule 300 mg  300 mg Oral TID Thalia Party, MD   300 mg at 08/04/22 9528   hydrOXYzine (ATARAX) tablet 25 mg  25 mg Oral Q6H PRN Thalia Party, MD   25 mg at 08/03/22 2109   influenza vac split quadrivalent PF (FLUARIX) injection 0.5 mL  0.5 mL Intramuscular Tomorrow-1000 [place], Harrold Donath, MD       loperamide (IMODIUM) capsule 2-4 mg  2-4 mg Oral PRN Thalia Party, MD       LORazepam (ATIVAN) tablet 1 mg  1 mg Oral Q6H PRN Korben Carcione, MD       magnesium hydroxide (MILK OF MAGNESIA) suspension 30 mL  30 mL Oral Daily PRN Lamar Sprinkles, MD       multivitamin with minerals tablet 1 tablet  1 tablet Oral Daily Thalia Party, MD   1 tablet at 08/04/22 4132   nicotine polacrilex (NICORETTE) gum 2 mg  2 mg Oral PRN Thalia Party, MD   2 mg at 08/03/22 1632   OLANZapine zydis (ZYPREXA) disintegrating tablet 5 mg  5 mg Oral QHS Lamar Sprinkles, MD   5 mg at 08/03/22 2109   ondansetron (ZOFRAN-ODT) disintegrating tablet 4 mg  4 mg Oral Q6H PRN Thalia Party, MD       thiamine (Vitamin B-1) tablet 100 mg  100 mg Oral Daily Thalia Party, MD   100 mg at 08/04/22 4401    Lab Results:  Results for orders placed or performed during the hospital encounter of 08/02/22 (from the past 48 hour(s))  Magnesium     Status: None   Collection Time: 08/03/22  6:42 AM  Result Value Ref Range   Magnesium 2.4 1.7 - 2.4 mg/dL    Comment: Performed at [facility], [address], [place], [place] [address]  Lipid panel     Status: Abnormal   Collection Time: 08/03/22  6:42 AM  Result Value Ref Range   Cholesterol 245 (H) 0 - 200 mg/dL   Triglycerides 366 (H) <150 mg/dL   HDL 79 >44 mg/dL   Total CHOL/HDL Ratio 3.1 RATIO   VLDL 36 0 - 40 mg/dL   LDL Cholesterol 034 (H) 0 - 99 mg/dL    Comment:        Total Cholesterol/HDL:CHD Risk Coronary Heart Disease Risk Table                      Men   Women  1/2 Average Risk   3.4   3.3  Average Risk       5.0   4.4  2 X Average Risk   9.6   7.1  3 X Average Risk  23.4   11.0        Use the calculated  Patient Ratio above and the CHD Risk Table to determine the patient's CHD Risk.        ATP III CLASSIFICATION (LDL):  <100     mg/dL   Optimal  [id number]  mg/dL   Near or Above                    Optimal  130-159  mg/dL   Borderline  [id number]  mg/dL   High  >811>190     mg/dL   Very High Performed at [facility], [address] [place], [place] [address]   SARS Coronavirus 2 by RT PCR (hospital order, performed in [facility] hospital lab) *cepheid single result test* Anterior Nasal Swab     Status: None   Collection Time: 08/04/22  6:27 AM   Specimen: Anterior Nasal Swab  Result Value Ref Range   SARS Coronavirus 2 by RT PCR NEGATIVE NEGATIVE    Comment: (NOTE) SARS-CoV-2 target nucleic acids are NOT DETECTED.  The SARS-CoV-2 RNA is generally detectable in upper and lower respiratory specimens during the acute phase of infection. The lowest concentration of SARS-CoV-2 viral copies this assay can detect is 250 copies / mL. A negative result does not preclude SARS-CoV-2 infection and should not be used as the sole basis for treatment or other patient management decisions.  A negative result may occur with improper specimen collection / handling, submission of specimen other than nasopharyngeal swab, presence of viral mutation(s) within the areas targeted by this assay, and inadequate number of viral copies (<250 copies / mL). A negative result must be combined with clinical observations, patient history, and epidemiological information.  Fact Sheet for Patients:   RoadLapTop.co.zahttps://www.fda.gov/media/158405/download  Fact Sheet for Healthcare Providers: http://kim-miller.com/https://www.fda.gov/media/158404/download  This test is not yet approved or  cleared by the [place] FDA and has been authorized for detection  and/or diagnosis of SARS-CoV-2 by FDA under an Emergency Use Authorization (EUA).  This EUA will remain in effect (meaning this test can be used) for the duration of the COVID-19 declaration under Section 564(b)(1) of the Act, 21 U.S.C. section 360bbb-3(b)(1), unless the authorization is terminated or revoked sooner.  Performed at [facility], [address] [place], [place] [address]     Blood Alcohol level:  Lab Results  Component Value Date   ETH 112 (H) 08/01/2022   ETH 32 (H) 07/30/2022    Metabolic Disorder Labs: Lab Results  Component Value Date   HGBA1C 5.7 (H) 03/22/2022   MPG 116.89 03/22/2022   No results found for: "PROLACTIN" Lab Results  Component Value Date   CHOL 245 (H) 08/03/2022   TRIG 181 (H) 08/03/2022   HDL 79 08/03/2022   CHOLHDL 3.1 08/03/2022   VLDL 36 08/03/2022   LDLCALC 130 (H) 08/03/2022   LDLCALC 112 (H) 03/22/2022    Physical Findings: AIMS:  , ,  ,  ,    CIWA:  CIWA-Ar Total: 4 COWS:     Musculoskeletal: Strength & Muscle Tone: within normal limits Gait & Station: normal Patient leans: N/A  Psychiatric Specialty Exam: Appearance:  AAF, appearing stated age,  wearing hospital clothes, with decreased grooming and hygiene. Tearful.   Attitude/Behavior: calm, cooperative, engaging with appropriate eye contact.   Motor: WNL; dyskinesias not evident. Gait appears in full range.   Speech: spontaneous, clear, coherent, normal comprehension.   Mood: euthymic, "better".   Affect: restricted   Thought process: patient appears coherent, organized, logical, goal-directed.   Thought  content: patient denies suicidal thoughts today, denies homicidal thoughts; did not express any delusions.   Thought perception: patient denies auditory and visual hallucinations today. Did not appear internally stimulated.   Cognition: patient is alert and oriented in self, place, date.   Insight: fair, in regards of understanding  of presence, nature, cause, and significance of mental or emotional problem.   Judgement: limited, in regards of ability to make good decisions concerning the appropriate thing to do in various situations, including ability to form opinions regarding their mental health condition.   Assets  Assets: Desire for Improvement; Financial Resources/Insurance; Leisure Time; Social Support   Sleep  Sleep:No data recorded   Physical Exam: Physical/General: alert, NAD. Skin: no rashes. HEENT:  Normocephalic, atraumatic, PERRLA.  Trunk/Extremities: no muscle stiffness, no rigidity. Pulmonary: Pulmonary effort is normal.  Neuro: grossly non-focal, dyskinesias not evident, gait appears in full range. No dystonia. Mental Status: He is alert.  Other Medical: N/A   Review of Systems: Respiratory:  Reports cough. Negative for shortness of breath.   Cardiovascular:  Negative for chest pain.  Gastrointestinal:  Negative for diarrhea, nausea and vomiting.  Neurological:  Negative for dizziness and headaches.    ROS Blood pressure (!) 113/99, pulse (!) 103, temperature (!) 102.4 F (39.1 C), temperature source Oral, resp. rate 18, height 5\' 2"  (1.575 m), weight 68.5 kg, last menstrual period 05/17/2014, SpO2 99 %. Body mass index is 27.62 kg/m.   Treatment Plan Summary: Daily contact with patient to assess and evaluate symptoms and progress in treatment  [name] is a 56yo F with psychiatric history of major depressive disorder, alcohol use disorder, seizure disorder, who initially presented to ER with headache and disclosed suicidal ideation with plans of overdosing.    Today patient reports feeling better, less depressed, not suicidal, no psychosis. Patient has fever and cough, COVID test - negative. Objectively - no muscle rigidity, dystonia; the suspicion of NMS is low at this time. Will continue to monitor.   Diagnoses/ Active problems: -Major depressive disorder, recurrent, severe,  with psychotic features. -Generalized anxiety disorder -Cocaine use disorder -Alcohol use disorder     PLAN:   Safety and Monitoring: continue inpatient psych admission; 15-minute checks; daily contact with patient to assess and evaluate symptoms and progress in treatment; psychoeducation.Vital signs: q12 hours. Precautions: suicide, elopement, and assault. Placed on room lock out for meals, snacks and groups.   Medications: - continue Olanzapine (started in ER) 5mg  PO QHS for depression and psychosis. - continue Gabapentin 300mg  PO TID for anxiety, seizure propx.  The risks/benefits/side-effects/alternatives to this medication were discussed in detail with the patient and time was given for questions. The patient consents to medications.   -CIWA with MVI, thiamine and PRN Ativan for scores >10.   - Metabolic profile and EKG monitoring obtained while on an atypical antipsychotic.   - Encouraged patient to participate in unit milieu and in scheduled group therapies    PRN medications:  acetaminophen, alum & mag hydroxide-simeth, hydrOXYzine, loperamide, LORazepam, magnesium hydroxide, ondansetron   Pertinent Labs: ER labs reviewed; no new labs ordered today.   Consults: No new consults placed since yesterday    Discharge Planning: -Social work and case management to assist with discharge planning and identification of hospital follow-up needs prior to discharge -Estimated LOS: 3-5 days -Discharge Concerns: Need to establish a safety plan; Medication compliance and effectiveness -Discharge Goals: Return home with outpatient referrals for mental health follow-up including medication management/psychotherapy   Total Time Spent  in Direct Patient Care:  I personally spent 35 minutes on the unit in direct patient care. The direct patient care time included face-to-face time with the patient, reviewing the patient's chart, communicating with other professionals, and coordinating care.  Greater than 50% of this time was spent in counseling or coordinating care with the patient regarding goals of hospitalization, psycho-education, and discharge planning needs.      Thalia Party, MD 08/04/2022, 10:53 AM

## 2022-08-05 ENCOUNTER — Encounter (HOSPITAL_COMMUNITY): Payer: Self-pay

## 2022-08-05 LAB — CBC
HCT: 42 % (ref 36.0–46.0)
Hemoglobin: 12.9 g/dL (ref 12.0–15.0)
MCH: 27.7 pg (ref 26.0–34.0)
MCHC: 30.7 g/dL (ref 30.0–36.0)
MCV: 90.1 fL (ref 80.0–100.0)
Platelets: 176 10*3/uL (ref 150–400)
RBC: 4.66 MIL/uL (ref 3.87–5.11)
RDW: 14.7 % (ref 11.5–15.5)
WBC: 6.5 10*3/uL (ref 4.0–10.5)
nRBC: 0 % (ref 0.0–0.2)

## 2022-08-05 LAB — BASIC METABOLIC PANEL
Anion gap: 8 (ref 5–15)
BUN: 13 mg/dL (ref 6–20)
CO2: 27 mmol/L (ref 22–32)
Calcium: 9.3 mg/dL (ref 8.9–10.3)
Chloride: 97 mmol/L — ABNORMAL LOW (ref 98–111)
Creatinine, Ser: 0.61 mg/dL (ref 0.44–1.00)
GFR, Estimated: >60 mL/min (ref >60)
Glucose, Bld: 184 mg/dL — ABNORMAL HIGH (ref 70–99)
Potassium: 3.7 mmol/L (ref 3.5–5.1)
Sodium: 132 mmol/L — ABNORMAL LOW (ref 135–145)

## 2022-08-05 LAB — HEMOGLOBIN A1C
Hgb A1c MFr Bld: 5.6 % (ref 4.8–5.6)
Mean Plasma Glucose: 114 mg/dL

## 2022-08-05 MED ORDER — FLUOXETINE HCL 10 MG PO CAPS
10.0000 mg | ORAL_CAPSULE | Freq: Every day | ORAL | Status: DC
  Administered 2022-08-05: 10 mg via ORAL
  Filled 2022-08-05 (×4): qty 1

## 2022-08-05 MED ORDER — OLANZAPINE 5 MG PO TABS
5.0000 mg | ORAL_TABLET | Freq: Every day | ORAL | Status: DC
  Filled 2022-08-05 (×2): qty 1

## 2022-08-05 MED ORDER — FLUOXETINE HCL 20 MG PO CAPS
20.0000 mg | ORAL_CAPSULE | Freq: Every day | ORAL | Status: DC
  Administered 2022-08-06 – 2022-08-07 (×2): 20 mg via ORAL
  Filled 2022-08-05 (×4): qty 1

## 2022-08-05 MED ORDER — OLANZAPINE 7.5 MG PO TABS
7.5000 mg | ORAL_TABLET | Freq: Every day | ORAL | Status: DC
  Administered 2022-08-05 – 2022-08-06 (×2): 7.5 mg via ORAL
  Filled 2022-08-05 (×4): qty 1

## 2022-08-05 MED ORDER — ENSURE ENLIVE PO LIQD
237.0000 mL | Freq: Two times a day (BID) | ORAL | Status: DC
  Administered 2022-08-05 – 2022-08-07 (×4): 237 mL via ORAL
  Filled 2022-08-05 (×13): qty 237

## 2022-08-05 NOTE — Progress Notes (Addendum)
[facility] MD Progress Note  08/05/2022 11:11 AM Theresa Rios  MRN:  [id number]  Principal Problem: MDD (major depressive disorder), recurrent, severe, with psychosis (HCC) Diagnosis: Principal Problem:   MDD (major depressive disorder), recurrent, severe, with psychosis (HCC)  [name] is a 56yo F with psychiatric history of major depressive disorder, alcohol use disorder, seizure disorder, who initially presented to ER with headache and disclosed suicidal ideation with plans of overdosing.    Interval History Patient was seen today for re-evaluation.  Nursing reports no events overnight. The patient has no issues with performing ADLs.  Patient has been medication compliant.    Patient was seen and interviewed by attending psychiatrist. Chart reviewed. Patient discussed during treatment team rounds.  Subjective:  On assessment patient reports feeling worse due to poor sleep secondary to her coughing. She states she is still experiencing auditory and visual hallucinations last night. She states she was hearing negative voices putting her down that she had been having arguments with and she saw flashing lights when she showered last night. She reports poor appetite. She denies feeling paranoid, unsafe, does not express any delusions. Denies present SI/HI. She complains of continued cough and mild headache. Denies muscle stiffness, dystonia. Denies acute alcohol withdrawal symptoms at this time.  Patient frustrated assessment today in discussion of use disorder as well as when asked her alert and oriented questions.  She was able to state name, date, but unable to state location which aggravated her ("why you are asking me these questions? You know what city we are in"). Discussed I asked these questions to assess her cognition.  Discussed medication adjustments and frustrated that I was allowing her to decide whether she wanted to take certain medications insisting that "if I didn't want it, why  wouldn't you give it to me if you thought it would help?". I stated we respect patient autonomy and that I do not force medications onto patients if not absolutely required. Patient became quiet and no longer wished to speak. We briefly talked about discharge planning and she did request to speak to LCSW regarding this.  Objective Today's Vitals   08/05/22 0613 08/05/22 0631 08/05/22 0632 08/05/22 0954  BP:  105/71 96/79   Pulse:  (!) 103 (!) 109   Resp:      Temp:  98.6 F (37 C)    TempSrc:  Oral    SpO2:  98%    Weight:      Height:      PainSc: 0-No pain   0-No pain   Body mass index is 27.62 kg/m. Labs: COVID test performed this AM - negative.  Total Time spent with patient: 30 minutes  Past Psychiatric History: see H&P   Past Medical History:  Past Medical History:  Diagnosis Date   Seizures (HCC)    History reviewed. No pertinent surgical history. Family History: History reviewed. No pertinent family history. Family Psychiatric  History: see H&P Social History:  Social History   Substance and Sexual Activity  Alcohol Use Yes   Comment: 4-5 beers a day most days     Social History   Substance and Sexual Activity  Drug Use Yes   Types: Cocaine, Marijuana    Social History   Socioeconomic History   Marital status: Media planner    Spouse name: Not on file   Number of children: 0   Years of education: Not on file   Highest education level: Not on file  Occupational History  Not on file  Tobacco Use   Smoking status: Every Day    Packs/day: 0.50    Types: Cigarettes   Smokeless tobacco: Never  Vaping Use   Vaping Use: Never used  Substance and Sexual Activity   Alcohol use: Yes    Comment: 4-5 beers a day most days   Drug use: Yes    Types: Cocaine, Marijuana   Sexual activity: Not on file  Other Topics Concern   Not on file  Social History Narrative   Pt lives in [place] with a friend. Her husband is living with his mother. Pt recently  lost her job and her place to stay.   Social Determinants of Health   Financial Resource Strain: Not on file  Food Insecurity: Food Insecurity Present (08/02/2022)   Hunger Vital Sign    Worried About Running Out of Food in the Last Year: Sometimes true    Ran Out of Food in the Last Year: Sometimes true  Transportation Needs: Unmet Transportation Needs (08/02/2022)   PRAPARE - Administrator, Civil Service (Medical): Yes    Lack of Transportation (Non-Medical): Yes  Physical Activity: Not on file  Stress: Not on file  Social Connections: Not on file   Additional Social History:                         Sleep: Fair  Appetite:  Fair  Current Medications: Current Facility-Administered Medications  Medication Dose Route Frequency Provider Last Rate Last Admin   acetaminophen (TYLENOL) tablet 650 mg  650 mg Oral Q6H PRN Thalia Party, MD   650 mg at 08/05/22 0513   alum & mag hydroxide-simeth (MAALOX/MYLANTA) 200-200-20 MG/5ML suspension 30 mL  30 mL Oral Q4H PRN Lamar Sprinkles, MD       feeding supplement (ENSURE ENLIVE / ENSURE PLUS) liquid 237 mL  237 mL Oral BID BM Park Pope, MD   237 mL at 08/05/22 1003   FLUoxetine (PROZAC) capsule 10 mg  10 mg Oral Daily Park Pope, MD       gabapentin (NEURONTIN) capsule 300 mg  300 mg Oral TID Thalia Party, MD   300 mg at 08/05/22 0746   hydrOXYzine (ATARAX) tablet 25 mg  25 mg Oral Q6H PRN Thalia Party, MD   25 mg at 08/04/22 2107   influenza vac split quadrivalent PF (FLUARIX) injection 0.5 mL  0.5 mL Intramuscular Tomorrow-1000 Massengill, Nathan, MD       loperamide (IMODIUM) capsule 2-4 mg  2-4 mg Oral PRN Thalia Party, MD       LORazepam (ATIVAN) tablet 1 mg  1 mg Oral Q6H PRN Thalia Party, MD   1 mg at 08/04/22 1526   magnesium hydroxide (MILK OF MAGNESIA) suspension 30 mL  30 mL Oral Daily PRN Lamar Sprinkles, MD       menthol-cetylpyridinium (CEPACOL) lozenge 3 mg  1 lozenge Oral PRN Thalia Party, MD        multivitamin with minerals tablet 1 tablet  1 tablet Oral Daily Thalia Party, MD   1 tablet at 08/05/22 0746   nicotine polacrilex (NICORETTE) gum 2 mg  2 mg Oral PRN Thalia Party, MD   2 mg at 08/05/22 1040   OLANZapine (ZYPREXA) tablet 5 mg  5 mg Oral QHS Park Pope, MD       ondansetron (ZOFRAN-ODT) disintegrating tablet 4 mg  4 mg Oral Q6H PRN Thalia Party, MD  thiamine (Vitamin B-1) tablet 100 mg  100 mg Oral Daily Thalia Party, MD   100 mg at 08/05/22 6063    Lab Results:  Results for orders placed or performed during the hospital encounter of 08/02/22 (from the past 48 hour(s))  SARS Coronavirus 2 by RT PCR (hospital order, performed in [facility] hospital lab) *cepheid single result test* Anterior Nasal Swab     Status: None   Collection Time: 08/04/22  6:27 AM   Specimen: Anterior Nasal Swab  Result Value Ref Range   SARS Coronavirus 2 by RT PCR NEGATIVE NEGATIVE    Comment: (NOTE) SARS-CoV-2 target nucleic acids are NOT DETECTED.  The SARS-CoV-2 RNA is generally detectable in upper and lower respiratory specimens during the acute phase of infection. The lowest concentration of SARS-CoV-2 viral copies this assay can detect is 250 copies / mL. A negative result does not preclude SARS-CoV-2 infection and should not be used as the sole basis for treatment or other patient management decisions.  A negative result may occur with improper specimen collection / handling, submission of specimen other than nasopharyngeal swab, presence of viral mutation(s) within the areas targeted by this assay, and inadequate number of viral copies (<250 copies / mL). A negative result must be combined with clinical observations, patient history, and epidemiological information.  Fact Sheet for Patients:   RoadLapTop.co.za  Fact Sheet for Healthcare Providers: http://kim-miller.com/  This test is not yet approved or  cleared by the [place]  FDA and has been authorized for detection and/or diagnosis of SARS-CoV-2 by FDA under an Emergency Use Authorization (EUA).  This EUA will remain in effect (meaning this test can be used) for the duration of the COVID-19 declaration under Section 564(b)(1) of the Act, 21 U.S.C. section 360bbb-3(b)(1), unless the authorization is terminated or revoked sooner.  Performed at [facility], [address] [place], [place] [address]     Blood Alcohol level:  Lab Results  Component Value Date   ETH 112 (H) 08/01/2022   ETH 32 (H) 07/30/2022    Metabolic Disorder Labs: Lab Results  Component Value Date   HGBA1C 5.6 08/03/2022   MPG 114 08/03/2022   MPG 116.89 03/22/2022   No results found for: "PROLACTIN" Lab Results  Component Value Date   CHOL 245 (H) 08/03/2022   TRIG 181 (H) 08/03/2022   HDL 79 08/03/2022   CHOLHDL 3.1 08/03/2022   VLDL 36 08/03/2022   LDLCALC 130 (H) 08/03/2022   LDLCALC 112 (H) 03/22/2022    Physical Findings: AIMS:  , ,  ,  ,    CIWA:  CIWA-Ar Total: 4 COWS:     Musculoskeletal: Strength & Muscle Tone: within normal limits Gait & Station: normal Patient leans: N/A  Psychiatric Specialty Exam: Appearance:  AAF, appearing stated age,  wearing hospital clothes, with decreased grooming and hygiene. Tearful.   Attitude/Behavior: labile, frustrated, poor eye contact   Motor: WNL; dyskinesias not evident. Gait appears in full range.   Speech: spontaneous, clear, coherent, normal comprehension.   Mood: euthymic, "better".   Affect: restricted   Thought process: patient appears coherent, organized, logical, goal-directed.   Thought content: patient denies suicidal thoughts today, denies homicidal thoughts; did not express any delusions.   Thought perception: endorsed AVH last night but denies present AVH. Did not appear internally preoccupied on assessment.   Cognition: patient is alert and oriented in self and date.    Insight: fair, in regards of understanding of presence, nature,  cause, and significance of mental or emotional problem.   Judgement: limited, in regards of ability to make good decisions concerning the appropriate thing to do in various situations, including ability to form opinions regarding their mental health condition.   Assets  Assets: Desire for Improvement; Financial Resources/Insurance; Leisure Time; Social Support   Sleep  Sleep: poor   Physical Exam: Physical/General: alert, NAD. Skin: no rashes. HEENT:  Normocephalic, atraumatic, PERRLA.  Trunk/Extremities: no muscle stiffness, no rigidity. Pulmonary: Pulmonary effort is normal.  Neuro: grossly non-focal, dyskinesias not evident, gait appears in full range. No dystonia. Mental Status: He is alert.  Other Medical: N/A   Review of Systems: Respiratory:  Reports cough. Negative for shortness of breath.   Cardiovascular:  Negative for chest pain.  Gastrointestinal:  Negative for diarrhea, nausea and vomiting.  Neurological:  Negative for dizziness and headaches.    Review of Systems  Respiratory:  Negative for shortness of breath.   Cardiovascular:  Negative for chest pain.  Gastrointestinal:  Negative for abdominal pain, constipation, diarrhea, heartburn, nausea and vomiting.  Neurological:  Negative for headaches.   Blood pressure 96/79, pulse (!) 109, temperature 98.6 F (37 C), temperature source Oral, resp. rate 18, height  (1.575 m), weight 68.5 kg, last menstrual period 05/17/2014, SpO2 98 %. Body mass index is 27.62 kg/m.   Treatment Plan Summary: Daily contact with patient to assess and evaluate symptoms and progress in treatment  Ms. Raffo is a 56yo F with psychiatric history of major depressive disorder, alcohol use disorder, seizure disorder, who initially presented to ER with headache and disclosed suicidal ideation with plans of overdosing.    Patient's depression and psychosis appear to  have worsened.  Unclear whether this is secondary to discussing patient's discharge planning but she did report poor sleep due to cough.    Diagnoses/ Active problems: -Major depressive disorder, recurrent, severe, with psychotic features. -Generalized anxiety disorder -Cocaine use disorder -Alcohol use disorder     PLAN:   Safety and Monitoring: continue inpatient psych admission; 15-minute checks; daily contact with patient to assess and evaluate symptoms and progress in treatment; psychoeducation.Vital signs: q12 hours. Precautions: suicide, elopement, and assault. Placed on room lock out for meals, snacks and groups.   Medications: - continue Olanzapine  PO QHS for depression and psychosis. - START fluoxetine 10 mg daily for depressive symptoms - continue Gabapentin  PO TID for anxiety, seizure propx.  The risks/benefits/side-effects/alternatives to this medication were discussed in detail with the patient and time was given for questions. The patient consents to medications. -START ensure bid between meals for nutritional support   -Repeating CBC and BMP to assess for signs of infection or poor po intake  -CIWA with MVI, thiamine and PRN Ativan for scores >10.   - Metabolic profile and EKG monitoring obtained while on an atypical antipsychotic.   - Encouraged patient to participate in unit milieu and in scheduled group therapies    PRN medications:  acetaminophen, alum & mag hydroxide-simeth, hydrOXYzine, loperamide, LORazepam, magnesium hydroxide, ondansetron   Pertinent Labs: ER labs reviewed; no new labs ordered today.   Consults: No new consults placed since yesterday    Discharge Planning: -Social work and case management to assist with discharge planning and identification of hospital follow-up needs prior to discharge -Estimated LOS: 3-5 days -Discharge Concerns: Need to establish a safety plan; Medication compliance and effectiveness -Discharge Goals:  Return home with outpatient referrals for mental health follow-up including medication management/psychotherapy   Total  Time Spent in Direct Patient Care:  I personally spent 35 minutes on the unit in direct patient care. The direct patient care time included face-to-face time with the patient, reviewing the patient's chart, communicating with other professionals, and coordinating care. Greater than 50% of this time was spent in counseling or coordinating care with the patient regarding goals of hospitalization, psycho-education, and discharge planning needs.      Park PopeAndrew Tahira Olivarez, MD 08/05/2022, 11:11 AM

## 2022-08-05 NOTE — BH IP Treatment Plan (Signed)
Interdisciplinary Treatment and Diagnostic Plan Update  08/05/2022 Time of Session: 9:55am Theresa Rios MRN: [id number]  Principal Diagnosis: MDD (major depressive disorder), recurrent, severe, with psychosis ([place])  Secondary Diagnoses: Principal Problem:   MDD (major depressive disorder), recurrent, severe, with psychosis ([place])   Current Medications:  Current Facility-Administered Medications  Medication Dose Route Frequency Provider Last Rate Last Admin   acetaminophen (TYLENOL) tablet 650 mg  650 mg Oral Q6H PRN Larita Fife, MD   650 mg at 08/05/22 0513   alum & mag hydroxide-simeth (MAALOX/MYLANTA) 200-200-20 MG/5ML suspension 30 mL  30 mL Oral Q4H PRN Rosezetta Schlatter, MD       feeding supplement (ENSURE ENLIVE / ENSURE PLUS) liquid 237 mL  237 mL Oral BID BM France Ravens, MD   237 mL at 08/05/22 1003   FLUoxetine (PROZAC) capsule 10 mg  10 mg Oral Daily France Ravens, MD       gabapentin (NEURONTIN) capsule 300 mg  300 mg Oral TID Larita Fife, MD   300 mg at 08/05/22 0746   hydrOXYzine (ATARAX) tablet 25 mg  25 mg Oral Q6H PRN Larita Fife, MD   25 mg at 08/04/22 2107   influenza vac split quadrivalent PF (FLUARIX) injection 0.5 mL  0.5 mL Intramuscular Tomorrow-1000 [place], Ovid Curd, MD       loperamide (IMODIUM) capsule 2-4 mg  2-4 mg Oral PRN Larita Fife, MD       LORazepam (ATIVAN) tablet 1 mg  1 mg Oral Q6H PRN Larita Fife, MD   1 mg at 08/04/22 1526   magnesium hydroxide (MILK OF MAGNESIA) suspension 30 mL  30 mL Oral Daily PRN Rosezetta Schlatter, MD       menthol-cetylpyridinium (CEPACOL) lozenge 3 mg  1 lozenge Oral PRN Larita Fife, MD       multivitamin with minerals tablet 1 tablet  1 tablet Oral Daily Larita Fife, MD   1 tablet at 08/05/22 0746   nicotine polacrilex (NICORETTE) gum 2 mg  2 mg Oral PRN Larita Fife, MD   2 mg at 08/05/22 1040   OLANZapine (ZYPREXA) tablet 5 mg  5 mg Oral QHS France Ravens, MD       ondansetron (ZOFRAN-ODT) disintegrating tablet 4 mg  4 mg  Oral Q6H PRN Larita Fife, MD       thiamine (Vitamin B-1) tablet 100 mg  100 mg Oral Daily Larita Fife, MD   100 mg at 08/05/22 0746   PTA Medications: Medications Prior to Admission  Medication Sig Dispense Refill Last Dose   acetaminophen (TYLENOL) 325 MG tablet Take 2 tablets (650 mg total) by mouth every 6 (six) hours as needed for mild pain.      cyclobenzaprine (FLEXERIL) 10 MG tablet Take 10 mg by mouth at bedtime as needed for muscle spasms.      gabapentin (NEURONTIN) 600 MG tablet Take 600 mg by mouth at bedtime.      hydrOXYzine (ATARAX) 25 MG tablet Take 1 tablet (25 mg total) by mouth every 6 (six) hours as needed (anxiety/agitation or CIWA < or = 10). 30 tablet 0    levETIRAcetam (KEPPRA) 1000 MG tablet Take 1,000 mg by mouth 2 (two) times daily.      loperamide (IMODIUM) 2 MG capsule Take 1-2 capsules (2-4 mg total) by mouth as needed for diarrhea or loose stools. 30 capsule 0    LORazepam (ATIVAN) 1 MG tablet Take 1 tablet (1 mg total) by mouth every 6 (six) hours as needed (CIWA >  10). 30 tablet 0    LORazepam (ATIVAN) 1 MG tablet Take 1 tablet (1 mg total) by mouth as needed for anxiety (severe agitation). 30 tablet 0    Multiple Vitamin (MULTIVITAMIN WITH MINERALS) TABS tablet Take 1 tablet by mouth daily.      OLANZapine zydis (ZYPREXA) 5 MG disintegrating tablet Take 1 tablet (5 mg total) by mouth at bedtime.      OLANZapine zydis (ZYPREXA) 5 MG disintegrating tablet Take 1 tablet (5 mg total) by mouth every 8 (eight) hours as needed (agitation).      ondansetron (ZOFRAN-ODT) 4 MG disintegrating tablet Take 1 tablet (4 mg total) by mouth every 6 (six) hours as needed for nausea or vomiting. 20 tablet 0    sertraline (ZOLOFT) 100 MG tablet Take 100 mg by mouth at bedtime.      thiamine (VITAMIN B-1) 100 MG tablet Take 1 tablet (100 mg total) by mouth daily.      traZODone (DESYREL) 100 MG tablet Take 1 tablet (100 mg total) by mouth at bedtime as needed for sleep. (Patient  taking differently: Take 100 mg by mouth at bedtime.) 30 tablet 0    ziprasidone (GEODON) 20 MG injection Inject 20 mg into the muscle as needed for agitation. 1 each      Patient Stressors: Financial difficulties   Marital or family conflict   Substance abuse    Patient Strengths: Capable of independent living  Communication skills   Treatment Modalities: Medication Management, Group therapy, Case management,  1 to 1 session with clinician, Psychoeducation, Recreational therapy.   Physician Treatment Plan for Primary Diagnosis: MDD (major depressive disorder), recurrent, severe, with psychosis (HCC) Long Term Goal(s): Improvement in symptoms so as ready for discharge   Short Term Goals: Ability to identify changes in lifestyle to reduce recurrence of condition will improve Ability to verbalize feelings will improve Ability to disclose and discuss suicidal ideas Ability to demonstrate self-control will improve Ability to identify and develop effective coping behaviors will improve Ability to maintain clinical measurements within normal limits will improve Compliance with prescribed medications will improve Ability to identify triggers associated with substance abuse/mental health issues will improve  Medication Management: Evaluate patient's response, side effects, and tolerance of medication regimen.  Therapeutic Interventions: 1 to 1 sessions, Unit Group sessions and Medication administration.  Evaluation of Outcomes: Progressing  Physician Treatment Plan for Secondary Diagnosis: Principal Problem:   MDD (major depressive disorder), recurrent, severe, with psychosis (HCC)  Long Term Goal(s): Improvement in symptoms so as ready for discharge   Short Term Goals: Ability to identify changes in lifestyle to reduce recurrence of condition will improve Ability to verbalize feelings will improve Ability to disclose and discuss suicidal ideas Ability to demonstrate self-control  will improve Ability to identify and develop effective coping behaviors will improve Ability to maintain clinical measurements within normal limits will improve Compliance with prescribed medications will improve Ability to identify triggers associated with substance abuse/mental health issues will improve     Medication Management: Evaluate patient's response, side effects, and tolerance of medication regimen.  Therapeutic Interventions: 1 to 1 sessions, Unit Group sessions and Medication administration.  Evaluation of Outcomes: Progressing   RN Treatment Plan for Primary Diagnosis: MDD (major depressive disorder), recurrent, severe, with psychosis (HCC) Long Term Goal(s): Knowledge of disease and therapeutic regimen to maintain health will improve  Short Term Goals: Ability to remain free from injury will improve, Ability to verbalize frustration and anger appropriately will improve, Ability to  demonstrate self-control, Ability to participate in decision making will improve, Ability to verbalize feelings will improve, Ability to disclose and discuss suicidal ideas, Ability to identify and develop effective coping behaviors will improve, and Compliance with prescribed medications will improve  Medication Management: RN will administer medications as ordered by provider, will assess and evaluate patient's response and provide education to patient for prescribed medication. RN will report any adverse and/or side effects to prescribing provider.  Therapeutic Interventions: 1 on 1 counseling sessions, Psychoeducation, Medication administration, Evaluate responses to treatment, Monitor vital signs and CBGs as ordered, Perform/monitor CIWA, COWS, AIMS and Fall Risk screenings as ordered, Perform wound care treatments as ordered.  Evaluation of Outcomes: Progressing   LCSW Treatment Plan for Primary Diagnosis: MDD (major depressive disorder), recurrent, severe, with psychosis ([place]) Long Term  Goal(s): Safe transition to appropriate next level of care at discharge, Engage patient in therapeutic group addressing interpersonal concerns.  Short Term Goals: Engage patient in aftercare planning with referrals and resources, Increase social support, Increase ability to appropriately verbalize feelings, Increase emotional regulation, Facilitate acceptance of mental health diagnosis and concerns, Facilitate patient progression through stages of change regarding substance use diagnoses and concerns, Identify triggers associated with mental health/substance abuse issues, and Increase skills for wellness and recovery  Therapeutic Interventions: Assess for all discharge needs, 1 to 1 time with Social worker, Explore available resources and support systems, Assess for adequacy in community support network, Educate family and significant other(s) on suicide prevention, Complete Psychosocial Assessment, Interpersonal group therapy.  Evaluation of Outcomes: Progressing   Progress in Treatment: Attending groups: Yes. Participating in groups: Yes. Taking medication as prescribed: Yes. Toleration medication: Yes. Family/Significant other contact made: No, will contact:   Louretta Parma, friend, [phone] Patient understands diagnosis: Yes. Discussing patient identified problems/goals with staff: Yes. Medical problems stabilized or resolved: Yes. Denies suicidal/homicidal ideation: Yes. Issues/concerns per patient self-inventory: No.   New problem(s) identified: No, Describe:  none reported   New Short Term/Long Term Goal(s):  medication stabilization, elimination of SI thoughts, development of comprehensive mental wellness plan.    Patient Goals:  Pt states, "I want to get better sleep"  Discharge Plan or Barriers: Patient recently admitted. CSW will continue to follow and assess for appropriate referrals and possible discharge planning.    Reason for Continuation of Hospitalization:  Anxiety Depression Hallucinations Medication stabilization Suicidal ideation Other; describe sleep issues  Estimated Length of Stay: 3-7 days  Last 3 [place] Suicide Severity Risk Score: [facility] Admission (Current) from 08/02/2022 in [place] [facility] ED from [facility]01/20[facility] in [facility] ED from 07/30/2022 in [place] Low Risk High Risk High Risk       Last PHQ 2/9 Scores:    04/03/2022    8:34 AM  Depression screen PHQ 2/9  Decreased Interest 0  Down, Depressed, Hopeless 0  PHQ - 2 Score 0    Scribe for Treatment Team: Zachery Conch, LCSW 08/05/2022 11:45 AM

## 2022-08-05 NOTE — BHH Suicide Risk Assessment (Signed)
BHH INPATIENT:  Family/Significant Other Suicide Prevention Education  Suicide Prevention Education:  Education Completed; Alverda Skeans [phone] (Friend) has been identified by the patient as the family member/significant other with whom the patient will be residing, and identified as the person(s) who will aid the patient in the event of a mental health crisis (suicidal ideations/suicide attempt).  With written consent from the patient, the family member/significant other has been provided the following suicide prevention education, prior to the and/or following the discharge of the patient.  The suicide prevention education provided includes the following: Suicide risk factors Suicide prevention and interventions National Suicide Hotline telephone number [facility] assessment telephone number [facility] Emergency Assistance 911 [facility] and/or Residential Mobile Crisis Unit telephone number  Request made of family/significant other to: Remove weapons (e.g., guns, rifles, knives), all items previously/currently identified as safety concern.   Remove drugs/medications (over-the-counter, prescriptions, illicit drugs), all items previously/currently identified as a safety concern.  The family member/significant other verbalizes understanding of the suicide prevention education information provided.  The family member/significant other agrees to remove the items of safety concern listed above.  CSW spoke with Mr. Katrinka Blazing who states that he is a friend of Ms. Adorno and that they use to "be partners" a few years ago.  He states that Ms. 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  He states that she often cries and has been showing symptoms of depression.  The phone was then taken from Mr. Katrinka Blazing and the Pt's mother began talking to CSW.  CSW informed the mother that she did not have permission to speak with  her.  The Pt's mother stated "then you will listen to what I have to say about it".  The Pt's mother states that her daughter has been drinking and experiencing homelessness.  She states that her daughter has "a toxic relationship with Mr. Katrinka Blazing".  She states that she would like her daughter to go to residential treatment and to "stay away from Mr. Katrinka Blazing because he is no good for her".  She also states that she does not want her daughter to know that she shared this information with the CSW.  The Pt's mother states that there are no firearms or weapons in her daughter's possession.  Mr. Katrinka Blazing also states that there are no firearms or weapons in his friend's possession.  He states that he will continue to be a support for his friend after discharge.  CSW completed SPE with Mr. Katrinka Blazing.   Met[place] Kung Mohan Erven 08/05/2022, 3:01 PM

## 2022-08-05 NOTE — Progress Notes (Signed)
CSW provided the Pt with a packet that contains information including shelter and housing resources, free and reduced price food information, clothing resources, crisis center information, a [employer] card, and suicide prevention information.   

## 2022-08-05 NOTE — Progress Notes (Signed)
D: Pt denied SI/HI/AVH this morning. Pt rated her depression a 10/10, anxiety a 5/10, and feelings of hopelessness a 10/10. Pt reports that she did not sleep last night due to her depression and anxiety. Pt reports that last night she heard voices telling her to jump out of the window. Pt denied hearing these voices this morning. Pt seen sleeping in her room for most of the morning. Pt has been pleasant and cooperative throughout the shift.   A: RN provided support and encouragement to patient. Pt given scheduled medications as prescribed. Q15 min checks verified for safety.    R: Patient verbally contracts for safety. Patient compliant with medications and treatment plan. Patient is interacting well on the unit. Pt is safe on the unit.   08/05/22 0956  Psych Admission Type (Psych Patients Only)  Admission Status Voluntary  Psychosocial Assessment  Patient Complaints Anxiety;Depression  Eye Contact Fair  Facial Expression Animated  Affect Appropriate to circumstance  Speech Pressured  Interaction Assertive;Needy  Motor Activity Fidgety  Appearance/Hygiene Unremarkable  Behavior Characteristics Anxious;Fidgety  Mood Anxious;Pleasant  Thought Process  Coherency WDL  Content WDL  Delusions None reported or observed  Perception Hallucinations  Hallucination Auditory;Visual  Judgment Impaired  Confusion None  Danger to Self  Current suicidal ideation? Denies  Self-Injurious Behavior No self-injurious ideation or behavior indicators observed or expressed   Agreement Not to Harm Self Yes  Description of Agreement Verbal  Danger to Others  Danger to Others None reported or observed

## 2022-08-05 NOTE — Progress Notes (Signed)
Pt did not attend the evening group. 

## 2022-08-05 NOTE — Group Note (Signed)
Recreation Therapy Group Note   Group Topic:Stress Management  Group Date: 08/05/2022 Start Time: 0935 End Time: 0950 Facilitators: Alisea Matte-McCall, LRT,CTRS Location: [address] Dayroom   Goal Area(s) Addresses:  Patient will identify positive stress management techniques. Patient will identify benefits of using stress management post d/c.  Group Description:  Guided Imagery.  LRT discussed the technique to patients and explained what to expect.  LRT then read a script for patients to envision their peaceful place.  Pt were to be as comfortable as possible and mentally envision the place that gives them the most relaxation and calm.    Affect/Mood: N/A   Participation Level: Did not attend    Clinical Observations/Individualized Feedback:     Plan: Continue to engage patient in RT group sessions 2-3x/week.   Cosme Jacob-McCall, LRT,CTRS 08/05/2022 11:53 AM

## 2022-08-06 MED ORDER — HYDROXYZINE HCL 25 MG PO TABS
25.0000 mg | ORAL_TABLET | ORAL | Status: AC
  Administered 2022-08-06: 25 mg via ORAL
  Filled 2022-08-06 (×2): qty 1

## 2022-08-06 NOTE — BHH Group Notes (Signed)
  Spiritual care group on grief and loss facilitated by chaplain Dyanne Carrel, [facility]  Group Goal:  Support / Education around grief and loss  Members engage in facilitated group support and psycho-social education.  Group Description:  Following introductions and group rules, group members engaged in facilitated group dialog and support around topic of loss, with particular support around experiences of loss in their lives. Group Identified types of loss (relationships / self / things) and identified patterns, circumstances, and changes that precipitate losses. Reflected on thoughts / feelings around loss, normalized grief responses, and recognized variety in grief experience. Group noted Worden's four tasks of grief in discussion.  Group drew on Adlerian / Rogerian, narrative, MI,  Patient Progress: Theresa Rios was present for the beginning of group but chose to leave once we introduced the topic.    [address], Bcc Pager, [phone]

## 2022-08-06 NOTE — BHH Group Notes (Signed)
The focus of this group is to help patients review their daily goal of treatment and discuss progress on daily workbooks. Pt was attentive and appropriate during tonight's wrap up group. Pt was able to share that she was able to go attend meals with peers today and was able to eat small meals. Pt stated that she will continue to work on eating smaller meals and  not getting sick after.

## 2022-08-06 NOTE — Progress Notes (Signed)
   08/05/22 2200  Psych Admission Type (Psych Patients Only)  Admission Status Voluntary  Psychosocial Assessment  Patient Complaints Anxiety;Sadness  Eye Contact Fair  Facial Expression Flat  Affect Appropriate to circumstance  Speech Slow  Interaction Assertive  Motor Activity Slow  Appearance/Hygiene Unremarkable  Behavior Characteristics Cooperative;Anxious  Mood Sad  Thought Process  Coherency WDL  Content WDL  Delusions None reported or observed  Perception Hallucinations  Hallucination Auditory  Judgment Limited  Confusion None  Danger to Self  Current suicidal ideation? Denies  Self-Injurious Behavior No self-injurious ideation or behavior indicators observed or expressed   Agreement Not to Harm Self Yes  Description of Agreement verbal  Danger to Others  Danger to Others None reported or observed

## 2022-08-06 NOTE — BHH Counselor (Signed)
Upon request by the Pt, CSW faxed a referral for the [facility]  The Pt states that she would like assistance with applying for Disability.  The [facility] can also assist the Pt with housing resources and referrals.  The [facility] referral was faxed on 08/06/2022 at 1:19pm.

## 2022-08-06 NOTE — Group Note (Signed)
Recreation Therapy Group Note   Group Topic:Animal Assisted Therapy   Group Date: 08/06/2022 Start Time: 1425 End Time: 1500 Facilitators: Lattie Cervi-McCall, LRT,CTRS Location: [address] Dayroom   Animal-Assisted Activity (AAA) Program Checklist/Progress Note Patient Eligibility Criteria Checklist & Daily Group note for Rec Tx Intervention   AAA/T Program Assumption of Risk Form signed by Patient/ or Parent Legal Guardian YES  Patient is free of allergies or severe asthma  YES  Patient reports no fear of animals YES  Patient reports no history of cruelty to animals YES  Patient understands their participation is voluntary YES  Patient washes hands before animal contact YES  Patient washes hands after animal contact YES   Affect/Mood: Appropriate   Participation Level: Engaged   Participation Quality: Independent   Behavior: Appropriate    Clinical Observations/Individualized Feedback:  Patients provided opportunity to interact with trained and credentialed [employer] [facility] dog and the community volunteer/dog handler. Patients practiced appropriate animal interaction and were educated on dog safety outside of the hospital in common community settings. Patients were allowed to use dog toys and other items to practice commands, engage the dog in play, and/or complete routine aspects of animal care.   Plan: Continue to engage patient in RT group sessions 2-3x/week.   Reuben Knoblock-McCall, LRT,CTRS  08/06/2022 4:01 PM

## 2022-08-06 NOTE — Group Note (Signed)
LCSW Group Therapy Note   Group Date: 08/06/2022 Start Time: 1100 End Time: 1200  Type of Therapy: Group Therapy: Boundaries  Participation Level: Active  Description of Group: This group will address the use of boundaries in their personal lives. Patients will explore why boundaries are important, the difference between healthy and unhealthy boundaries, and negative and positive outcomes of different boundaries and will look at how boundaries can be crossed.  Patients will be encouraged to identify current boundaries in their own lives and identify what kind of boundary is being set. Facilitators will guide patients in utilizing problem-solving interventions to address and correct types boundaries being used and to address when no boundary is being used. Understanding and applying boundaries will be explored and addressed for obtaining and maintaining a balanced life. Patients will be encouraged to explore ways to assertively make their boundaries and needs known to significant others in their lives, using other group members and facilitator for role play, support, and feedback.   Therapeutic Goals: 1. Patient will identify areas in their life where setting clear boundaries could be used to improve their life.  2. Patient will identify signs/triggers that a boundary is not being respected. 3. Patient will identify two ways to set boundaries in order to achieve balance in their lives: 4. Patient will demonstrate ability to communicate their needs and set boundaries through discussion and/or role plays  Summary of Progress/Problems: The Pt attended group and participated throughout the group session.  The Pt was appropriate with their peers and demonstrated understanding of the topic being discussed.  The Pt was able to identify areas in their life where they can create boundaries or establish more secure boundaries with the people around them.  The Pt accepted all worksheets and materials that  were offered.   Aram Beecham, LCSWA 08/06/2022  1:30 PM

## 2022-08-06 NOTE — Progress Notes (Signed)
   08/06/22 2000  Psych Admission Type (Psych Patients Only)  Admission Status Voluntary  Psychosocial Assessment  Patient Complaints Anxiety;Depression  Eye Contact Fair  Facial Expression Animated  Affect Appropriate to circumstance  Speech Logical/coherent  Interaction Assertive  Motor Activity Other (Comment) (wnl)  Appearance/Hygiene Unremarkable  Behavior Characteristics Cooperative;Anxious  Mood Depressed;Anxious;Pleasant  Thought Process  Coherency WDL  Content WDL  Delusions None reported or observed  Perception Hallucinations  Hallucination Auditory;Command  Judgment Limited  Confusion None  Danger to Self  Current suicidal ideation? Denies  Danger to Others  Danger to Others None reported or observed   Progress note   D: Pt seen in dayroom interacting with peers. Pt denies SI, HI, VH. Pt endorses command AH that tell her to hurt herself but she talks back to the voices to quiet them down. Pt rates pain  10/10 as chronic pain in her lower back. Pt rates anxiety  8/10 and depression  0/10. Pt says today has been a very good day.  Pt denies any withdrawal symptoms. "I told that doctor that I didn't come here for alcohol. I came to get help for depression and seizures. I fell and hit my head at work at [employer] and they still haven't gotten me my money. I need to be able to find a place to stay and stay on my meds." No other concerns noted at this time.  A: Pt provided support and encouragement. Pt given scheduled medication as prescribed. PRNs as appropriate. Q15 min checks for safety.   R: Pt safe on the unit. Will continue to monitor.

## 2022-08-06 NOTE — Progress Notes (Signed)
D. Pt continues to present anxious, but friendly- observed throughout the shift in the milieu interacting appropriately with peers and staff. Pt continues to endorse intermittent AH, but hasn't been seen responding to internal stimuli. Per pt's self inventory, pt rated her depression,hopelessness and anxiety a 7/7/5, respectively. Pt denied SI/HI and VH. A. Labs and vitals monitored. Pt given and educated on medications. Pt supported emotionally and encouraged to express concerns and ask questions.   R. Pt remains safe with 15 minute checks. Will continue POC.

## 2022-08-06 NOTE — Progress Notes (Addendum)
[facility] MD Progress Note  08/06/2022 8:22 AM Theresa Rios  MRN:  [id number]  Principal Problem: MDD (major depressive disorder), recurrent, severe, with psychosis ([place]) Diagnosis: Principal Problem:   MDD (major depressive disorder), recurrent, severe, with psychosis ([place])  Reason for admission: Theresa Rios is a 56yo F with psychiatric history of major depressive disorder, alcohol use disorder, seizure disorder, who initially presented to ER with headache and disclosed suicidal ideation with plans of overdosing.   Subjective On assessment Theresa Rios reports feeling worse due to mood switching quickly between anger and then sadness. She states she is still experiencing auditory and visual hallucinations with most recent telling her the heater was going to blow up while she was in the shower and then telling her not take her medications and to just put them in her pocket. She states she was hearing negative voices putting her down that she had been having arguments with and she reports continued flashing lights when she showers. She reports minimal improvement in appetite. She denies feeling paranoid, unsafe, does not express any delusions. She reports SI with a plan in which she is going to go [facility] on Friday after discharge and overdose on pills. Denies present HI. She complains of continued cough and malaise. Denies muscle stiffness, dystonia. Denies acute alcohol withdrawal symptoms at this time.   Theresa Rios reporting continued frustration today around discussion of alcohol use disorder as well as when asked her alert and oriented questions. She was able to state name but unable to state location, year, and month. Discussed medication adjustments. We briefly talked about discharge planning and she did request to speak to LCSW regarding this. She reported frustration around discharge planning and reports a fear of being discharged to the streets where she will freeze. Discussed with the  Theresa Rios her options of places to be discharged to.   Objective Chart Review Pas 24 hours of Theresa Rios's chart was reviewed.  Theresa Rios is compliant with scheduled meds. Required Agitation PRNs: no Per RN notes, no documented behavioral issues and is attending group. Theresa Rios slept, most hours of the night  Lab Results:  Results for orders placed or performed during the hospital encounter of 08/02/22 (from the past 24 hour(s))  CBC     Status: None   Collection Time: 08/05/22  6:36 PM  Result Value Ref Range   WBC 6.5 4.0 - 10.5 K/uL   RBC 4.66 3.87 - 5.11 MIL/uL   Hemoglobin 12.9 12.0 - 15.0 g/dL   HCT 42.0 36.0 - 46.0 %   MCV 90.1 80.0 - 100.0 fL   MCH 27.7 26.0 - 34.0 pg   MCHC 30.7 30.0 - 36.0 g/dL   RDW 14.7 11.5 - 15.5 %   Platelets 176 150 - 400 K/uL   nRBC 0.0 0.0 - 0.2 %  Basic metabolic panel     Status: Abnormal   Collection Time: 08/05/22  6:36 PM  Result Value Ref Range   Sodium 132 (L) 135 - 145 mmol/L   Potassium 3.7 3.5 - 5.1 mmol/L   Chloride 97 (L) 98 - 111 mmol/L   CO2 27 22 - 32 mmol/L   Glucose, Bld 184 (H) 70 - 99 mg/dL   BUN 13 6 - 20 mg/dL   Creatinine, Ser 0.61 0.44 - 1.00 mg/dL   Calcium 9.3 8.9 - 10.3 mg/dL   GFR, Estimated >60 >60 mL/min   Anion gap 8 5 - 15   Physical Findings: CIWA:  CIWA-Ar Total: 4  Musculoskeletal: Strength & Muscle Tone: within normal limits Gait & Station: normal Theresa Rios leans: N/A   Psychiatric Specialty Exam: Appearance:  AAF, appearing stated age,  wearing hospital clothes, with decreased grooming and hygiene. Tearful.   Attitude/Behavior: labile, frustrated, poor eye contact   Motor: WNL; dyskinesias not evident. Gait appears in full range.   Speech: spontaneous, clear, coherent, normal comprehension.   Mood: euthymic, "better".   Affect: restricted   Thought process: Theresa Rios appears coherent, organized, logical, goal-directed.   Thought content: Theresa Rios reports SI with a plan to overdose on Friday,  denies homicidal thoughts; did not express any delusions.   Thought perception: endorses AVH around the heater exploding and telling her to not take her medications but denies present AVH. Did not appear internally preoccupied on assessment.   Cognition: Theresa Rios is alert and oriented in self and date.   Insight: fair, in regards of understanding of presence, nature, cause, and significance of mental or emotional problem.   Judgement: limited, in regards of ability to make good decisions concerning the appropriate thing to do in various situations, including ability to form opinions regarding their mental health condition.     Assets  Assets: Desire for Improvement; Financial Resources/Insurance; Leisure Time; Social Support    Sleep  Sleep: improved   Physical Exam: Physical/General: alert, NAD. Skin: no rashes. HEENT:  Normocephalic, atraumatic, PERRLA.  Trunk/Extremities: no muscle stiffness, no rigidity. Pulmonary: Pulmonary effort is normal.  Neuro: grossly non-focal, dyskinesias not evident, gait appears in full range. No dystonia. Mental Status: He is alert.  Other Medical: N/A  Psychomotor Activity: Psychomotor Activity: Normal   Review of Systems  Constitutional:  Positive for malaise/fatigue. Negative for chills and fever.  Respiratory:  Positive for cough. Negative for shortness of breath.   Cardiovascular:  Negative for chest pain.  Gastrointestinal:  Negative for diarrhea, nausea and vomiting.  Musculoskeletal:  Negative for myalgias.  Neurological:  Negative for dizziness and headaches.   Blood pressure (!) 85/72, pulse (!) 109, temperature 98.6 F (37 C), temperature source Oral, resp. rate 16, height 5\' 2"  (1.575 m), weight 68.5 kg, last menstrual period 05/17/2014, SpO2 98 %. Body mass index is 27.62 kg/m.  PRN No current facility-administered medications on file prior to encounter.   Current Outpatient Medications on File Prior to Encounter  Medication  Sig Dispense Refill   acetaminophen (TYLENOL) 325 MG tablet Take 2 tablets (650 mg total) by mouth every 6 (six) hours as needed for mild pain.     cyclobenzaprine (FLEXERIL) 10 MG tablet Take 10 mg by mouth at bedtime as needed for muscle spasms.     gabapentin (NEURONTIN) 600 MG tablet Take 600 mg by mouth at bedtime.     hydrOXYzine (ATARAX) 25 MG tablet Take 1 tablet (25 mg total) by mouth every 6 (six) hours as needed (anxiety/agitation or CIWA < or = 10). 30 tablet 0   levETIRAcetam (KEPPRA) 1000 MG tablet Take 1,000 mg by mouth 2 (two) times daily.     loperamide (IMODIUM) 2 MG capsule Take 1-2 capsules (2-4 mg total) by mouth as needed for diarrhea or loose stools. 30 capsule 0   LORazepam (ATIVAN) 1 MG tablet Take 1 tablet (1 mg total) by mouth every 6 (six) hours as needed (CIWA > 10). 30 tablet 0   LORazepam (ATIVAN) 1 MG tablet Take 1 tablet (1 mg total) by mouth as needed for anxiety (severe agitation). 30 tablet 0   Multiple Vitamin (MULTIVITAMIN WITH MINERALS) TABS tablet Take  1 tablet by mouth daily.     OLANZapine zydis (ZYPREXA) 5 MG disintegrating tablet Take 1 tablet (5 mg total) by mouth at bedtime.     OLANZapine zydis (ZYPREXA) 5 MG disintegrating tablet Take 1 tablet (5 mg total) by mouth every 8 (eight) hours as needed (agitation).     ondansetron (ZOFRAN-ODT) 4 MG disintegrating tablet Take 1 tablet (4 mg total) by mouth every 6 (six) hours as needed for nausea or vomiting. 20 tablet 0   sertraline (ZOLOFT) 100 MG tablet Take 100 mg by mouth at bedtime.     thiamine (VITAMIN B-1) 100 MG tablet Take 1 tablet (100 mg total) by mouth daily.     traZODone (DESYREL) 100 MG tablet Take 1 tablet (100 mg total) by mouth at bedtime as needed for sleep. (Theresa Rios taking differently: Take 100 mg by mouth at bedtime.) 30 tablet 0   ziprasidone (GEODON) 20 MG injection Inject 20 mg into the muscle as needed for agitation. 1 each    Assessment and Plan  Theresa Rios is a 56yo F with  psychiatric history of major depressive disorder, alcohol use disorder, seizure disorder, who initially presented to ER with headache and disclosed suicidal ideation with plans of overdosing.    Theresa Rios's depression and psychosis appear to have worsened. Unclear whether this is secondary to discussing Theresa Rios's discharge planning.  She did report improvement in sleep last night with increase in Olanzapine from 5mg  to 7.5 mg. She displays some cluster b traits as well as splitting between staff.  PLAN: Diagnoses/ Active problems: -Major depressive disorder, recurrent, severe, with psychotic features. -Generalized anxiety disorder -Cocaine use disorder -Alcohol use disorder   Safety and Monitoring: continue inpatient psych admission; 15-minute checks; daily contact with Theresa Rios to assess and evaluate symptoms and progress in treatment; psychoeducation.Vital signs: q12 hours. Precautions: suicide, elopement, and assault. Placed on room lock out for meals, snacks and groups.   Medications: - Continue Olanzapine to 7.5 mg PO QHS for depression and psychosis. - INCREASE fluoxetine 10 to 20mg  daily for depressive symptoms - continue Gabapentin 300mg  PO TID for anxiety, seizure propx.  The risks/benefits/side-effects/alternatives to this medication were discussed in detail with the Theresa Rios and time was given for questions. The Theresa Rios consents to medications. -CONTINUE ensure bid between meals for nutritional support   -Repeat CBC wnl -Sodium 132, corrects to 134, will repeat BMP close to discharge to monitor sodium   -CIWA with MVI, thiamine and PRN Ativan for scores 0000000.   - Metabolic profile and EKG monitoring obtained while on an atypical antipsychotic.   - Encouraged Theresa Rios to participate in unit milieu and in scheduled group therapies    PRN medications:  acetaminophen, alum & mag hydroxide-simeth, hydrOXYzine, loperamide, LORazepam, magnesium hydroxide, ondansetron   Pertinent Labs:  ER labs reviewed; no new labs ordered today.   Consults: No new consults placed since yesterday    Discharge Planning: -Social work and case management to assist with discharge planning and identification of hospital follow-up needs prior to discharge -Discharge Concerns: Need to establish a safety plan; Medication compliance and effectiveness -Discharge Goals: Return home with outpatient referrals for mental health follow-up including medication management/psychotherapy Dispo:[facility] in 2-3 days. Barriers include homelessness, case work spoke with Theresa Rios's friend Louretta Parma who is willing to give residence to Theresa Rios following discharge.  Total Time spent with Theresa Rios:  I personally spent 25 minutes on the unit in direct Theresa Rios care. The direct Theresa Rios care time included face-to-face time with the Theresa Rios,  reviewing the Theresa Rios's chart, communicating with other professionals, and coordinating care. Greater than 50% of this time was spent in counseling or coordinating care with the Theresa Rios regarding goals of hospitalization, psycho-education, and discharge planning needs.   Shirlyn Goltz, Medical Student 08/06/2022, 8:22 AM  Attestation for Student Documentation:  I personally was present and performed or re-performed the history, physical exam and medical decision-making activities of this service and have verified that the service and findings are accurately documented in the student's note.  France Ravens, MD 08/06/2022, 2:08 PM

## 2022-08-06 NOTE — Group Note (Signed)
Date:  08/06/2022 Time:  1:10 PM  Group Topic/Focus:  Goals Group:   The focus of this group is to help patients establish daily goals to achieve during treatment and discuss how the patient can incorporate goal setting into their daily lives to aide in recovery. Orientation:   The focus of this group is to educate the patient on the purpose and policies of crisis stabilization and provide a format to answer questions about their admission.  The group details unit policies and expectations of patients while admitted.    Participation Level:  Active  Participation Quality:  Appropriate  Affect:  Appropriate  Cognitive:  Appropriate  Insight: Appropriate  Engagement in Group:  Engaged  Modes of Intervention:  Discussion  Additional Comments:  Pt wants to try and eat better.  Jaquita Rector 08/06/2022, 1:10 PM

## 2022-08-07 MED ORDER — FLUOXETINE HCL 20 MG PO CAPS
40.0000 mg | ORAL_CAPSULE | Freq: Every day | ORAL | Status: DC
  Administered 2022-08-08 – 2022-08-09 (×2): 40 mg via ORAL
  Filled 2022-08-07: qty 20
  Filled 2022-08-07 (×2): qty 2
  Filled 2022-08-07: qty 20
  Filled 2022-08-07: qty 2

## 2022-08-07 MED ORDER — FLUOXETINE HCL 10 MG PO CAPS
10.0000 mg | ORAL_CAPSULE | Freq: Once | ORAL | Status: AC
  Administered 2022-08-07: 10 mg via ORAL
  Filled 2022-08-07: qty 1

## 2022-08-07 MED ORDER — OLANZAPINE 10 MG PO TABS
10.0000 mg | ORAL_TABLET | Freq: Every day | ORAL | Status: DC
  Administered 2022-08-07 – 2022-08-08 (×2): 10 mg via ORAL
  Filled 2022-08-07: qty 1
  Filled 2022-08-07: qty 10
  Filled 2022-08-07: qty 1
  Filled 2022-08-07: qty 10

## 2022-08-07 MED ORDER — WHITE PETROLATUM EX OINT
TOPICAL_OINTMENT | CUTANEOUS | Status: AC
  Filled 2022-08-07: qty 5

## 2022-08-07 NOTE — Progress Notes (Signed)
Patient observed in hallway this morning. Labile and upset with MHT over not being allowed to watch a movie in the dayroom before group. Spoke with patient and able to de-escalate situation. Patient med compliant. Patient reported sleeping poorly and sleep medication not helping. Patient denied SI,HI, and A/V/H with no plan or intent but rated her depression and anxiety as an 11 out of 10. Patient was focused on discharging today stating she had found a bed. Patient remained cooperative throughout this shift thus far and appears in better mood socializing in dayroom.    08/07/22 0825  Psych Admission Type (Psych Patients Only)  Admission Status Voluntary  Psychosocial Assessment  Patient Complaints Anxiety;Depression;Irritability  Eye Contact Fair  Facial Expression Anxious  Affect Appropriate to circumstance  Speech Logical/coherent  Interaction Assertive  Motor Activity Other (Comment) (WDL)  Appearance/Hygiene Unremarkable  Behavior Characteristics Irritable  Mood Anxious  Thought Process  Coherency WDL  Content Blaming others  Delusions None reported or observed  Perception WDL  Hallucination None reported or observed  Judgment Limited  Confusion None  Danger to Self  Current suicidal ideation? Denies  Agreement Not to Harm Self Yes  Description of Agreement verbally contracts for safety  Danger to Others  Danger to Others None reported or observed

## 2022-08-07 NOTE — Group Note (Signed)
Recreation Therapy Group Note   Group Topic:Team Building  Group Date: 08/07/2022 Start Time: 0930 End Time: 0955 Facilitators: Kaylob Wallen-McCall, LRT,CTRS Location: [address] Dayroom   Goal Area(s) Addresses:  Patient will effectively work with peer towards shared goal.  Patient will identify skills used to make activity successful.  Patient will identify how skills used during activity can be used to reach post d/c goals.   Group Description: Landing Pad. In teams of 3-5, patients were given 12 plastic drinking straws and an equal length of masking tape. Using the materials provided, patients were asked to build a landing pad to catch a golf ball dropped from approximately 5 feet in the air. All materials were required to be used by the team in their design. LRT facilitated post-activity discussion.   Affect/Mood: Anxious   Participation Level: None   Participation Quality: None   Behavior: Disruptive   Speech/Thought Process: Distracted   Insight: None   Judgement: None   Modes of Intervention: STEM Activity   Patient Response to Interventions:  Disengaged   Education Outcome:  Acknowledges education and In group clarification offered    Clinical Observations/Individualized Feedback: Pt came into group.  Pt did not participate.  Pt was constantly interrupting LRT , while LRT was observing the groups or instructing them.  Pt was constantly moving and couldn't sit down.  Pt had to be redirected many times.      Plan: Continue to engage patient in RT group sessions 2-3x/week.   Hill Mackie-McCall, LRT,CTRS 08/07/2022 12:29 PM

## 2022-08-07 NOTE — Plan of Care (Signed)
  Problem: Coping: Goal: Ability to verbalize frustrations and anger appropriately will improve Outcome: Progressing Goal: Ability to demonstrate self-control will improve Outcome: Progressing   Problem: Health Behavior/Discharge Planning: Goal: Identification of resources available to assist in meeting health care needs will improve Outcome: Progressing   

## 2022-08-07 NOTE — Progress Notes (Cosign Needed Addendum)
[facility] MD Progress Note  08/07/2022 9:47 AM Theresa Rios  MRN:  [id number]  Principal Problem: MDD (major depressive disorder), recurrent, severe, with psychosis (HCC) Diagnosis: Principal Problem:   MDD (major depressive disorder), recurrent, severe, with psychosis (HCC)  Reason for admission: [name] is a 56yo F with psychiatric history of major depressive disorder, alcohol use disorder, seizure disorder, who initially presented to ER with headache and disclosed suicidal ideation with plans of overdosing.   Subjective On assessment patient reports feeling overall improved. She reports continued mood switching quickly between anger and then sadness. She states she is still experiencing auditory hallucinations that she is ignoring and not listening to. She is unable to report what the voices are saying. She denies visual hallucinations. She reports minimal improvement in appetite. She denies feeling paranoid, unsafe, does not express any delusions. Denies present SI/HI. She complains of continued cough, headache, and malaise. She reports acute worsening of her chronic back pain. Denies acute alcohol withdrawal symptoms at this time. Pt reporting a conflict this morning in the group room in which she wanted to listen to the TV and other's wanted to listen to jazz music. She reports having strong feelings of disagreement and then feelings that others do not like her.    We briefly talked about discharge planning in which pt has found an available spot at [facility] and reports that the spot is on a first come first served bases. She was very eager to be able to take this spot and worries that it will not remain available following her treatment here. Despite this, patient reports she would still benefit from further psychiatric stabilization due to her hallucinations.    Objective Chart Review Pas 24 hours of patient's chart was reviewed.  Patient is compliant with scheduled meds. Required  Agitation PRNs: no Per RN notes, no documented behavioral issues and is attending group. Patient slept, 5.5 hours  Lab Results:  Results for orders placed or performed during the hospital encounter of 08/02/22 (from the past 24 hour(s))  CBC     Status: None   Collection Time: 08/05/22  6:36 PM  Result Value Ref Range   WBC 6.5 4.0 - 10.5 K/uL   RBC 4.66 3.87 - 5.11 MIL/uL   Hemoglobin 12.9 12.0 - 15.0 g/dL   HCT 29.5 18.8 - 41.6 %   MCV 90.1 80.0 - 100.0 fL   MCH 27.7 26.0 - 34.0 pg   MCHC 30.7 30.0 - 36.0 g/dL   RDW 60.6 30.1 - 60.1 %   Platelets 176 150 - 400 K/uL   nRBC 0.0 0.0 - 0.2 %  Basic metabolic panel     Status: Abnormal   Collection Time: 08/05/22  6:36 PM  Result Value Ref Range   Sodium 132 (L) 135 - 145 mmol/L   Potassium 3.7 3.5 - 5.1 mmol/L   Chloride 97 (L) 98 - 111 mmol/L   CO2 27 22 - 32 mmol/L   Glucose, Bld 184 (H) 70 - 99 mg/dL   BUN 13 6 - 20 mg/dL   Creatinine, Ser 0.93 0.44 - 1.00 mg/dL   Calcium 9.3 8.9 - 23.5 mg/dL   GFR, Estimated >57 >32 mL/min   Anion gap 8 5 - 15   Physical Findings: CIWA:  CIWA-Ar Total: 4   Musculoskeletal: Strength & Muscle Tone: within normal limits Gait & Station: normal Patient leans: N/A   Psychiatric Specialty Exam: Appearance:  AAF, appearing stated age,  wearing hospital clothes. Tearful.  Attitude/Behavior: labile, frustrated, poor eye contact   Motor: WNL; dyskinesias not evident. Gait appears in full range.   Speech: spontaneous, clear, coherent, normal comprehension.   Mood: euthymic, "better".   Affect: restricted   Thought process: patient appears coherent, organized, logical, goal-directed.   Thought content: pt denies SI, denies homicidal thoughts; did not express any delusions.   Thought perception: endorses AH that she is ignoring unable to report what the voices are saying but denies present AH. Denies VH. Did not appear internally preoccupied on assessment.   Cognition: patient is  alert and oriented in self and date.   Insight: fair, in regards of understanding of presence, nature, cause, and significance of mental or emotional problem.   Judgement: limited, in regards of ability to make good decisions concerning the appropriate thing to do in various situations, including ability to form opinions regarding their mental health condition.     Assets  Assets: Desire for Improvement; Financial Resources/Insurance; Leisure Time; Social Support    Sleep  Sleep: decreased due to another patient pacing the hallways throughout the night that kept her up   Physical Exam: Physical/General: alert, NAD. Skin: no rashes. HEENT:  Normocephalic, atraumatic, PERRLA.  Trunk/Extremities: no muscle stiffness, no rigidity. Pulmonary: Pulmonary effort is normal.  Neuro: grossly non-focal, dyskinesias not evident, gait appears in full range. No dystonia. Mental Status: She is alert.  Other Medical: N/A  Psychomotor Activity: Psychomotor Activity: Normal   Review of Systems  Constitutional:  Positive for malaise/fatigue. Negative for chills and fever.  Respiratory:  Positive for cough. Negative for shortness of breath.   Cardiovascular:  Negative for chest pain.  Gastrointestinal:  Negative for diarrhea, nausea and vomiting.  Musculoskeletal:  Positive for back pain. Neurological:  Positive for headaches. Negative for dizziness.   Blood pressure (!) 85/72, pulse (!) 109, temperature 98.6 F (37 C), temperature source Oral, resp. rate 16, height 5\' 2"  (1.575 m), weight 68.5 kg, last menstrual period 05/17/2014, SpO2 98 %. Body mass index is 27.62 kg/m.  PRN No current facility-administered medications on file prior to encounter.   Current Outpatient Medications on File Prior to Encounter  Medication Sig Dispense Refill   acetaminophen (TYLENOL) 325 MG tablet Take 2 tablets (650 mg total) by mouth every 6 (six) hours as needed for mild pain.     cyclobenzaprine (FLEXERIL)  10 MG tablet Take 10 mg by mouth at bedtime as needed for muscle spasms.     gabapentin (NEURONTIN) 600 MG tablet Take 600 mg by mouth at bedtime.     hydrOXYzine (ATARAX) 25 MG tablet Take 1 tablet (25 mg total) by mouth every 6 (six) hours as needed (anxiety/agitation or CIWA < or = 10). 30 tablet 0   levETIRAcetam (KEPPRA) 1000 MG tablet Take 1,000 mg by mouth 2 (two) times daily.     loperamide (IMODIUM) 2 MG capsule Take 1-2 capsules (2-4 mg total) by mouth as needed for diarrhea or loose stools. 30 capsule 0   LORazepam (ATIVAN) 1 MG tablet Take 1 tablet (1 mg total) by mouth every 6 (six) hours as needed (CIWA > 10). 30 tablet 0   LORazepam (ATIVAN) 1 MG tablet Take 1 tablet (1 mg total) by mouth as needed for anxiety (severe agitation). 30 tablet 0   Multiple Vitamin (MULTIVITAMIN WITH MINERALS) TABS tablet Take 1 tablet by mouth daily.     OLANZapine zydis (ZYPREXA) 5 MG disintegrating tablet Take 1 tablet (5 mg total) by mouth at bedtime.  OLANZapine zydis (ZYPREXA) 5 MG disintegrating tablet Take 1 tablet (5 mg total) by mouth every 8 (eight) hours as needed (agitation).     ondansetron (ZOFRAN-ODT) 4 MG disintegrating tablet Take 1 tablet (4 mg total) by mouth every 6 (six) hours as needed for nausea or vomiting. 20 tablet 0   sertraline (ZOLOFT) 100 MG tablet Take 100 mg by mouth at bedtime.     thiamine (VITAMIN B-1) 100 MG tablet Take 1 tablet (100 mg total) by mouth daily.     traZODone (DESYREL) 100 MG tablet Take 1 tablet (100 mg total) by mouth at bedtime as needed for sleep. (Patient taking differently: Take 100 mg by mouth at bedtime.) 30 tablet 0   ziprasidone (GEODON) 20 MG injection Inject 20 mg into the muscle as needed for agitation. 1 each    Assessment and Plan  Ms. Viviann Spareownsend is a 56yo F with psychiatric history of major depressive disorder, alcohol use disorder, seizure disorder, who initially presented to ER with headache and disclosed suicidal ideation with plans of  overdosing.    Patient's depression and psychosis appear to be improving. However, patient did endorse AH last night telling her to jump out window. We will increase her zyprexa to 10 mg qhs and titrate up prozac to 40 mg today.   PLAN: Diagnoses/ Active problems: -Major depressive disorder, recurrent, severe, with psychotic features. -Generalized anxiety disorder -Cocaine use disorder -Alcohol use disorder   Safety and Monitoring: continue inpatient psych admission; 15-minute checks; daily contact with patient to assess and evaluate symptoms and progress in treatment; psychoeducation.Vital signs: q12 hours. Precautions: suicide, elopement, and assault. Placed on room lock out for meals, snacks and groups.   Medications: - INCREASE Olanzapine to 10 mg PO QHS for depression and psychosis. - INCREASE fluoxetine to 30 mg for depressive symptoms today THEN INCREASE to fluoxetine 40 mg daily tomorrow - continue Gabapentin 300mg  PO TID for anxiety, seizure propx.  The risks/benefits/side-effects/alternatives to this medication were discussed in detail with the patient and time was given for questions. The patient consents to medications. -CONTINUE ensure bid between meals for nutritional support   -Repeat CBC wnl -Sodium 132, corrects to 134, will repeat BMP close to discharge to monitor sodium   -CIWA with MVI, thiamine and PRN Ativan for scores >10.   - Metabolic profile and EKG monitoring obtained while on an atypical antipsychotic.   - Encouraged patient to participate in unit milieu and in scheduled group therapies    PRN medications:  acetaminophen, alum & mag hydroxide-simeth, hydrOXYzine, loperamide, LORazepam, magnesium hydroxide, ondansetron   Pertinent Labs: ER labs reviewed; no new labs ordered today.   Consults: No new consults placed since yesterday    Discharge Planning: -Social work and case management to assist with discharge planning and identification of hospital  follow-up needs prior to discharge -Discharge Concerns: Need to establish a safety plan; Medication compliance and effectiveness -Discharge Goals: Return home with outpatient referrals for mental health follow-up including medication management/psychotherapy Dispo: Home tomorrow or the next day. Barriers include homelessness, case work spoke with patient's friend Alverda SkeansMarlan Smith who is willing to give residence to patient following discharge. [facility]  Total Time spent with patient:  I personally spent 25 minutes on the unit in direct patient care. The direct patient care time included face-to-face time with the patient, reviewing the patient's chart, communicating with other professionals, and coordinating care. Greater than 50% of this time was spent in counseling  or coordinating care with the patient regarding goals of hospitalization, psycho-education, and discharge planning needs.   Kathalene Frames, Medical Student 08/07/2022, 9:47 AM  Attestation for Student Documentation:  I personally was present and performed or re-performed the history, physical exam and medical decision-making activities of this service and have verified that the service and findings are accurately documented in the student's note.  Park Pope, MD 08/07/2022, 3:22 PM

## 2022-08-07 NOTE — Progress Notes (Signed)
   08/07/22 0540  Sleep  Number of Hours 5.5

## 2022-08-08 LAB — BASIC METABOLIC PANEL
Anion gap: 7 (ref 5–15)
BUN: 10 mg/dL (ref 6–20)
CO2: 26 mmol/L (ref 22–32)
Calcium: 9.2 mg/dL (ref 8.9–10.3)
Chloride: 106 mmol/L (ref 98–111)
Creatinine, Ser: 0.61 mg/dL (ref 0.44–1.00)
GFR, Estimated: >60 mL/min (ref >60)
Glucose, Bld: 246 mg/dL — ABNORMAL HIGH (ref 70–99)
Potassium: 3.9 mmol/L (ref 3.5–5.1)
Sodium: 139 mmol/L (ref 135–145)

## 2022-08-08 NOTE — Progress Notes (Signed)
   08/07/22 2005  Psych Admission Type (Psych Patients Only)  Admission Status Voluntary  Psychosocial Assessment  Patient Complaints Anxiety;Depression  Facial Expression Anxious  Affect Labile  Speech Logical/coherent  Interaction Assertive;Isolative  Motor Activity Other (Comment) (wnl)  Appearance/Hygiene Unremarkable  Behavior Characteristics Cooperative;Anxious  Mood Labile  Thought Process  Coherency WDL  Content Blaming others  Delusions None reported or observed  Perception WDL  Hallucination None reported or observed  Judgment Limited  Confusion None  Danger to Self  Current suicidal ideation? Denies  Danger to Others  Danger to Others None reported or observed   Progress note   D: Pt seen at nurse's station. Pt appears to be upset because she wants some clothing from a suitcase of clothing that was brought to her in preparation for discharge Friday. Pt is labile and tearful at times. Pt denies SI, HI, AVH. Pt rates pain  0/10. Pt rates anxiety  10/10 and depression  10/10. Pt said she had trouble sleeping last night. "That boy was up and slamming doors and making noise and I sat up and rocked on my bed most of the night. I told the doctor that this morning." Pt said she has a bed at a place in [place]. Pt asked to calmly voice her concerns. Pt said she wanted to look her best so she needed some clothes. Pt told that she could have her pajamas from the suitcase.  No other concerns noted at this time.  A: Pt provided support and encouragement. Pt given scheduled medication as prescribed. PRNs as appropriate. Q15 min checks for safety.   R: Pt safe on the unit. Will continue to monitor.

## 2022-08-08 NOTE — Progress Notes (Signed)
   08/08/22 2045  Psych Admission Type (Psych Patients Only)  Admission Status Voluntary  Psychosocial Assessment  Patient Complaints None  Eye Contact Fair  Facial Expression Animated  Affect Appropriate to circumstance  Speech Logical/coherent  Interaction Assertive;Manipulative  Motor Activity Other (Comment) (wnl)  Appearance/Hygiene Unremarkable  Behavior Characteristics Cooperative  Mood Pleasant  Thought Process  Coherency WDL  Content WDL  Delusions None reported or observed  Perception WDL  Hallucination None reported or observed  Judgment WDL  Confusion None  Danger to Self  Current suicidal ideation? Denies  Danger to Others  Danger to Others None reported or observed   Progress note   D: Pt seen at nurse's station. Pt denies SI, HI, AVH. Pt rates pain  7/10 as chronic lower back  pain. Pt rates anxiety  0/10 and depression  0/10. Pt states she has had a good day. Pt is excited about leaving tomorrow.  No other concerns noted at this time.  A: Pt provided support and encouragement. Pt given scheduled medication as prescribed. PRNs as appropriate. Q15 min checks for safety.   R: Pt safe on the unit. Will continue to monitor.

## 2022-08-08 NOTE — Progress Notes (Signed)
Adult Psychoeducational Group Note  Date:  08/08/2022 Time:  9:37 PM  Group Topic/Focus:  Wrap-Up Group:   The focus of this group is to help patients review their daily goal of treatment and discuss progress on daily workbooks.  Participation Level:  Active  Participation Quality:  Appropriate  Affect:  Appropriate  Cognitive:  Appropriate  Insight: Appropriate  Engagement in Group:  Engaged  Modes of Intervention:  Education and Exploration  Additional Comments:  Patient attended and participated in group tonight. She reports that she is learning about proper nutrition.   Lita Mains [facility] 08/08/2022, 9:37 PM

## 2022-08-08 NOTE — Progress Notes (Signed)
   08/08/22 0602  Sleep  Number of Hours 5

## 2022-08-08 NOTE — Progress Notes (Addendum)
[facility] MD Progress Note  08/08/2022 10:03 AM Theresa Rios  MRN:  [id number]  Principal Problem: MDD (major depressive disorder), recurrent, severe, with psychosis (HCC) Diagnosis: Principal Problem:   MDD (major depressive disorder), recurrent, severe, with psychosis (HCC)  Reason for admission: Theresa Rios is a 56yo F with psychiatric history of major depressive disorder, alcohol use disorder, seizure disorder, who initially presented to ER with headache and disclosed suicidal ideation with plans of overdosing.   Subjective On assessment patient reports feeling overall improved. She states her "mind feels focused." She is working with social work to get employment and wanting to start a savings plan. She reports she is getting extra rest for tomorrow. She reports improvement in appetite in which she ate all of her breakfast. She denies feeling paranoid, unsafe, does not express any delusions. Denies present SI/HI/AVH. She denies any voices in her head or flashing lights while in the shower. She denies cough, headache, and malaise. Denies acute alcohol withdrawal symptoms at this time.    We briefly talked about discharge planning in which pt has found an available spot at [facility] and reports that the spot is on a first come first served bases. She is agreeable to discharge tomorrow.   Objective Chart Review Pas 24 hours of patient's chart was reviewed.  Patient is compliant with scheduled meds. Required Agitation PRNs: no Per RN notes, no documented behavioral issues and is attending group. Patient slept, 5.5 hours  Lab Results:  Results for orders placed or performed during the hospital encounter of 08/02/22 (from the past 24 hour(s))  CBC     Status: None   Collection Time: 08/05/22  6:36 PM  Result Value Ref Range   WBC 6.5 4.0 - 10.5 K/uL   RBC 4.66 3.87 - 5.11 MIL/uL   Hemoglobin 12.9 12.0 - 15.0 g/dL   HCT 77.8 24.2 - 35.3 %   MCV 90.1 80.0 - 100.0 fL   MCH 27.7 26.0 -  34.0 pg   MCHC 30.7 30.0 - 36.0 g/dL   RDW 61.4 43.1 - 54.0 %   Platelets 176 150 - 400 K/uL   nRBC 0.0 0.0 - 0.2 %  Basic metabolic panel     Status: Abnormal   Collection Time: 08/05/22  6:36 PM  Result Value Ref Range   Sodium 132 (L) 135 - 145 mmol/L   Potassium 3.7 3.5 - 5.1 mmol/L   Chloride 97 (L) 98 - 111 mmol/L   CO2 27 22 - 32 mmol/L   Glucose, Bld 184 (H) 70 - 99 mg/dL   BUN 13 6 - 20 mg/dL   Creatinine, Ser 0.86 0.44 - 1.00 mg/dL   Calcium 9.3 8.9 - 76.1 mg/dL   GFR, Estimated >95 >09 mL/min   Anion gap 8 5 - 15   Physical Findings: CIWA:  CIWA-Ar Total: 4   Musculoskeletal: Strength & Muscle Tone: within normal limits Gait & Station: normal Patient leans: N/A   Psychiatric Specialty Exam: Appearance:  AAF, appearing stated age,  wearing hospital clothes.   Attitude/Behavior: pleasant, regular eye contact   Motor: WNL; dyskinesias not evident. Gait appears in full range.   Speech: spontaneous, clear, coherent, normal comprehension.   Mood: euthymic, "better".   Affect: neutral   Thought process: patient appears coherent, organized, logical, goal-directed.   Thought content: pt denies SI, denies homicidal thoughts; did not express any delusions.   Thought perception: Denies AVH. Not responding to any internal stimuli   Cognition: patient is  alert and oriented in self and date.   Insight: fair, in regards of understanding of presence, nature, cause, and significance of mental or emotional problem.   Judgement: limited, in regards of ability to make good decisions concerning the appropriate thing to do in various situations, including ability to form opinions regarding their mental health condition.     Assets  Assets: Desire for Improvement; Financial Resources/Insurance; Leisure Time; Social Support    Sleep  Sleep: Improved slept through the night   Physical Exam: Physical/General: alert, NAD. Skin: no rashes. HEENT:  Normocephalic,  atraumatic, PERRLA.  Trunk/Extremities: no muscle stiffness, no rigidity. Pulmonary: Pulmonary effort is normal.  Neuro: grossly non-focal, dyskinesias not evident, gait appears in full range. No dystonia. Mental Status: She is alert.  Other Medical: N/A  Psychomotor Activity: Psychomotor Activity: Normal   Review of Systems  Constitutional:  Negative for malaise, chills, and fever.  Respiratory:  Negative for shortness of breath and cough.   Cardiovascular:  Negative for chest pain.  Gastrointestinal:  Negative for diarrhea, nausea and vomiting.  Musculoskeletal:  Negative for back pain. Neurological:  Negative for dizziness and headache.   Vitals:   08/08/22 0608 08/08/22 0609  BP: 117/77 110/81  Pulse: 85 97  Resp:  18  Temp:  98.2 F (36.8 C)  SpO2:  99%    PRN No current facility-administered medications on file prior to encounter.   Current Outpatient Medications on File Prior to Encounter  Medication Sig Dispense Refill   acetaminophen (TYLENOL) 325 MG tablet Take 2 tablets (650 mg total) by mouth every 6 (six) hours as needed for mild pain.     cyclobenzaprine (FLEXERIL) 10 MG tablet Take 10 mg by mouth at bedtime as needed for muscle spasms.     gabapentin (NEURONTIN) 600 MG tablet Take 600 mg by mouth at bedtime.     hydrOXYzine (ATARAX) 25 MG tablet Take 1 tablet (25 mg total) by mouth every 6 (six) hours as needed (anxiety/agitation or CIWA < or = 10). 30 tablet 0   levETIRAcetam (KEPPRA) 1000 MG tablet Take 1,000 mg by mouth 2 (two) times daily.     loperamide (IMODIUM) 2 MG capsule Take 1-2 capsules (2-4 mg total) by mouth as needed for diarrhea or loose stools. 30 capsule 0   LORazepam (ATIVAN) 1 MG tablet Take 1 tablet (1 mg total) by mouth every 6 (six) hours as needed (CIWA > 10). 30 tablet 0   LORazepam (ATIVAN) 1 MG tablet Take 1 tablet (1 mg total) by mouth as needed for anxiety (severe agitation). 30 tablet 0   Multiple Vitamin (MULTIVITAMIN WITH  MINERALS) TABS tablet Take 1 tablet by mouth daily.     OLANZapine zydis (ZYPREXA) 5 MG disintegrating tablet Take 1 tablet (5 mg total) by mouth at bedtime.     OLANZapine zydis (ZYPREXA) 5 MG disintegrating tablet Take 1 tablet (5 mg total) by mouth every 8 (eight) hours as needed (agitation).     ondansetron (ZOFRAN-ODT) 4 MG disintegrating tablet Take 1 tablet (4 mg total) by mouth every 6 (six) hours as needed for nausea or vomiting. 20 tablet 0   sertraline (ZOLOFT) 100 MG tablet Take 100 mg by mouth at bedtime.     thiamine (VITAMIN B-1) 100 MG tablet Take 1 tablet (100 mg total) by mouth daily.     traZODone (DESYREL) 100 MG tablet Take 1 tablet (100 mg total) by mouth at bedtime as needed for sleep. (Patient taking differently: Take 100 mg  by mouth at bedtime.) 30 tablet 0   ziprasidone (GEODON) 20 MG injection Inject 20 mg into the muscle as needed for agitation. 1 each    Assessment and Plan  Ms. Lorensen is a 56yo F with psychiatric history of major depressive disorder, alcohol use disorder, seizure disorder, who initially presented to ER with headache and disclosed suicidal ideation with plans of overdosing.    Patient's depression and psychosis appear to be improving. Pt denies any AVH since increase her zyprexa to 10 mg qhs and titrate up prozac to 40 mg yesterday. Will continue to monitor at this dose throughout today and pend discharge for early tomorrow morning.   PLAN: Diagnoses/ Active problems: -Major depressive disorder, recurrent, severe, with psychotic features. -Generalized anxiety disorder -Cocaine use disorder -Alcohol use disorder   Safety and Monitoring: continue inpatient psych admission; 15-minute checks; daily contact with patient to assess and evaluate symptoms and progress in treatment; psychoeducation.Vital signs: q12 hours. Precautions: suicide, elopement, and assault. Placed on room lock out for meals, snacks and groups.   Medications: - CONTINUE  Olanzapine 10 mg PO QHS for depression and psychosis. - CONTINUE fluoxetine 40 mg for depressive symptoms - continue Gabapentin 300mg  PO TID for anxiety, seizure propx.  The risks/benefits/side-effects/alternatives to this medication were discussed in detail with the patient and time was given for questions. The patient consents to medications. -CONTINUE ensure bid between meals for nutritional support   -Repeat CBC wnl -Sodium 132, corrects to 134, will repeat BMP tomorrow AM   -CIWA with MVI, thiamine and PRN Ativan for scores >10.   - Metabolic profile and EKG monitoring obtained while on an atypical antipsychotic.   - Encouraged patient to participate in unit milieu and in scheduled group therapies    PRN medications:  acetaminophen, alum & mag hydroxide-simeth, hydrOXYzine, loperamide, LORazepam, magnesium hydroxide, ondansetron   Pertinent Labs: ER labs reviewed; no new labs ordered today.   Consults: No new consults placed since yesterday    Discharge Planning: -Social work and case management to assist with discharge planning and identification of hospital follow-up needs prior to discharge -Discharge Concerns: Need to establish a safety plan; Medication compliance and effectiveness -Discharge Goals: Return home with outpatient referrals for mental health follow-up including medication management/psychotherapy Dispo: [facility] tomorrow. Barriers include homelessness, case work spoke with patient's friend who is willing to give residence to patient following discharge. Pt has found a spot at [facility] and would like to discharge to here if spot available tomorrow morning.  Total Time spent with patient:  I personally spent 25 minutes on the unit in direct patient care. The direct patient care time included face-to-face time with the patient, reviewing the patient's chart, communicating with other professionals, and coordinating care. Greater than 50% of this  time was spent in counseling or coordinating care with the patient regarding goals of hospitalization, psycho-education, and discharge planning needs.   [facility], Medical Student 08/08/2022, 10:03 AM  Attestation for Student Documentation:  I personally was present and performed or re-performed the history, physical exam and medical decision-making activities of this service and have verified that the service and findings are accurately documented in the student's note.  08/10/2022, MD 08/08/2022, 10:25 AM

## 2022-08-08 NOTE — Progress Notes (Signed)
Pt on unit this morning. Pt medication compliant. Pt did not attend all groups. Pt endorses feelings of anxiety and depression. Pt denies suicidal thoughts. Pt focused on discharge planning. Q 15 minute checks ongoing.

## 2022-08-09 MED ORDER — OLANZAPINE 10 MG PO TABS: 10.0000 mg | ORAL_TABLET | Freq: Every day | ORAL | 0 refills | Status: DC

## 2022-08-09 MED ORDER — GABAPENTIN 300 MG PO CAPS: 300.0000 mg | ORAL_CAPSULE | Freq: Three times a day (TID) | ORAL | 0 refills | Status: DC

## 2022-08-09 MED ORDER — NICOTINE POLACRILEX 2 MG MT GUM: 2.0000 mg | CHEWING_GUM | OROMUCOSAL | 0 refills | Status: DC | PRN

## 2022-08-09 MED ORDER — FLUOXETINE HCL 40 MG PO CAPS: 40.0000 mg | ORAL_CAPSULE | Freq: Every day | ORAL | 0 refills | Status: DC

## 2022-08-09 NOTE — Progress Notes (Signed)
  [facility] Adult Case Management Discharge Plan :  Will you be returning to the same living situation after discharge:  No. [facility]  At discharge, do you have transportation home?: Yes,  Taxi  Do you have the ability to pay for your medications: Yes,  Community Support  Release of information consent forms completed and in the chart;  Patient's signature needed at discharge.  Patient to Follow up at:  Follow-up Information     [facility] Follow up on 08/19/2022.   Specialty: Behavioral Health Why: You have an appointment for therapy services with Phoebe Perch 08/19/22 at 10:00 am.  You also have an appointment for medication management services on 08/19/22 at 11:00 am with Gretchen Short.  These appointments will be Virtual video. Contact information: [address] [place] [address] [phone]        [facility] Follow up.   Why: A referral has been made for you,  Please contact this agency to follow-up on this referral to assist you with applying for Disability Services. Contact information: Address: [address], [place], [place] [address], [place]  Email: [email or web address]  Phone: [phone] Ext. [phone] Fax: [phone]        Services, [facility]. Go to.   Why: Please go to this provider to obtain an assessment for therapy and medication management services.  * Address:  [address] [place], [place]. Contact information: [address] [place] [address] [phone]                 Next level of care provider has access to [facility] Link:yes  Safety Planning and Suicide Prevention discussed: Yes,  with patient and friend      Has patient been referred to the Quitline?: Patient refused referral  Patient has been referred for addiction treatment: Pt. refused referral  Aram Beecham, LCSWA 08/09/2022, 9:21 AM

## 2022-08-09 NOTE — Discharge Instructions (Addendum)
-  Follow-up with your outpatient psychiatric provider -instructions on appointment date, time, and address (location) are provided to you in discharge paperwork.  -Take your psychiatric medications as prescribed at discharge - instructions are provided to you in the discharge paperwork. Your prescriptions are being printed at your request. You are getting samples at discharge.   -Follow-up with outpatient primary care doctor and other specialists -for management of preventative medicine and any chronic medical disease.  -Recommend abstinence from alcohol, tobacco, and other illicit drug use at discharge.   -If your psychiatric symptoms recur, worsen, or if you have side effects to your psychiatric medications, call your outpatient psychiatric provider, 911, [phone] or go to the nearest emergency department.  -If suicidal thoughts occur, call your outpatient psychiatric provider, 911, [phone] or go to the nearest emergency department.  Naloxone (Narcan) can help reverse an overdose when given to the victim quickly.  [facility] offers free naloxone kits and instructions/training on its use.  Add naloxone to your first aid kit and you can help save a life.   Pick up your free kit at the following locations:   [place]:  [place], [place] [address] [phone]) [facility] [place] [id number] [phone]) [facility] [place]  [place]: [place] [address] [place] [address] [phone]) [facility] [place] [address] [phone])

## 2022-08-09 NOTE — Progress Notes (Signed)
Pt left facility at this time. Pt removed all belongings, valuables, medication, and prescriptions. Pt verbalized understanding of medications and discharge instructions and follow up care. Pt was redirected for attempting to get peers to give her their items and clothes. Pt denies suicidal and homicidal thoughts. Pt denies a/v hallucinations.

## 2022-08-09 NOTE — Progress Notes (Signed)
   08/09/22 0621  Sleep  Number of Hours 4.75

## 2022-08-09 NOTE — Discharge Summary (Signed)
Physician Discharge Summary Note  Patient:  Theresa Rios is an 56 y.o., female MRN:  [id number] DOB:  1966-06-28 Patient phone:  [phone] (home)  Patient address:   [place] [address],  Total Time spent with patient: 20 minutes  Date of Admission:  08/02/2022 Date of Discharge: 08/09/22   Reason for Admission: Theresa Rios is a 56 yo female with past psychiatric history significant for major depressive disorder, cocaine use disorder alcohol use disorder, seizure disorder, who initially presented to this facility for suicidal ideation with plans of overdosing on medications accompanied by command auditory hallucinations, and admitted to this facility for treatment and management of her major depressive disorder with psychotic features, anxiety, and substance use disorders as mentioned above.    Principal Problem: MDD (major depressive disorder), recurrent, severe, with psychosis (HCC) Discharge Diagnoses: Principal Problem:   MDD (major depressive disorder), recurrent, severe, with psychosis (HCC)   Past Psychiatric History:  Previous psych diagnoses: MDD, GAD, alcohol use d/o, cocaine use d/o.Marland Kitchen Previous psychiatric hospitalizations: several, last to [facility] 04/2022. History of prior suicide attempts: "yes" Non-suicidal self-injurious behaviors: denies History of violence: denies Previous psych medication: Zoloft, Gabapentin.  Past Medical History:  Past Medical History:  Diagnosis Date   Seizures (HCC)    History reviewed. No pertinent surgical history. Family History: History reviewed. No pertinent family history. Family Psychiatric  History: Patient denies suicide in family members.  Social History:  Social History   Substance and Sexual Activity  Alcohol Use Yes   Comment: 4-5 beers a day most days     Social History   Substance and Sexual Activity  Drug Use Yes   Types: Cocaine, Marijuana    Social History   Socioeconomic History   Marital  status: Media planner    Spouse name: Not on file   Number of children: 0   Years of education: Not on file   Highest education level: Not on file  Occupational History   Not on file  Tobacco Use   Smoking status: Every Day    Packs/day: 0.50    Types: Cigarettes   Smokeless tobacco: Never  Vaping Use   Vaping Use: Never used  Substance and Sexual Activity   Alcohol use: Yes    Comment: 4-5 beers a day most days   Drug use: Yes    Types: Cocaine, Marijuana   Sexual activity: Not on file  Other Topics Concern   Not on file  Social History Narrative   Pt lives in [place] with a friend. Her husband is living with his mother. Pt recently lost her job and her place to stay.   Social Determinants of Health   Financial Resource Strain: Not on file  Food Insecurity: Food Insecurity Present (08/02/2022)   Hunger Vital Sign    Worried About Running Out of Food in the Last Year: Sometimes true    Ran Out of Food in the Last Year: Sometimes true  Transportation Needs: Unmet Transportation Needs (08/02/2022)   PRAPARE - Administrator, Civil Service (Medical): Yes    Lack of Transportation (Non-Medical): Yes  Physical Activity: Not on file  Stress: Not on file  Social Connections: Not on file    Hospital Course:   HOSPITAL COURSE:  During the patient's hospitalization, patient had extensive initial psychiatric evaluation, and follow-up psychiatric evaluations every day.  Psychiatric diagnoses provided upon initial assessment:  -Major depressive disorder, recurrent, severe, with psychotic features. -Generalized anxiety disorder -Cocaine use disorder -  Alcohol use disorder  Patient's psychiatric medications were adjusted on admission:  - INCREASED Zyprexa 5 mg to 10 mg PO daily, for her depression and psychosis - STARTED Gabapentin 300 mg TID, for seizure prophylaxis - STARTED Thiamine 100 mg daily, for her alcohol use disorder - INCREASED Prozac 10 mg to 20  mg daily, for her depressive symptoms - STARTED CIWA protocol with Ativan PRN, for score >10 - STARTED Nicotine gum, for smoking cessation  During the hospitalization, no other adjustments were made to the patient's psychiatric medication regimen.  Patient's care was discussed during the interdisciplinary team meeting every day during the hospitalization.  The patient denies having side effects to prescribed psychiatric medication.  Gradually, patient started adjusting to milieu. The patient was evaluated each day by a clinical provider to ascertain response to treatment. Improvement was noted by the patient's report of decreasing symptoms, improved sleep and appetite, affect, medication tolerance, behavior, and participation in unit programming.  Patient was asked each day to complete a self inventory noting mood, mental status, pain, new symptoms, anxiety and concerns.    Symptoms were reported as significantly decreased or resolved completely by discharge.   On day of discharge, the patient reports that their mood is stable and improved . The patient denied having suicidal thoughts for more than 48 hours prior to discharge.  Patient denies having homicidal thoughts.  Patient denies having auditory hallucinations.  Patient denies any visual hallucinations or other symptoms of psychosis. The patient was motivated to continue taking medication with a goal of continued improvement in mental health.   The patient reports their target psychiatric symptoms of depression, suicidal thoughts and auditory hallucinations all responded well to the psychiatric medications, and the patient reports overall benefit other psychiatric hospitalization. Supportive psychotherapy was provided to the patient. The patient also participated in regular group therapy while hospitalized. Coping skills, problem solving as well as relaxation therapies were also part of the unit programming.  Labs were reviewed with the  patient, and abnormal results were discussed with the patient.  The patient is able to verbalize their individual safety plan to this provider.  # It is recommended to the patient to continue psychiatric medications as prescribed, after discharge from the hospital.    # It is recommended to the patient to follow up with your outpatient psychiatric provider and PCP.  # It was discussed with the patient, the impact of alcohol, drugs, tobacco have been there overall psychiatric and medical wellbeing, and total abstinence from substance use was recommended the patient.ed.  # Prescriptions provided or sent directly to preferred pharmacy at discharge. Patient agreeable to plan. Given opportunity to ask questions. Appears to feel comfortable with discharge.    # In the event of worsening symptoms, the patient is instructed to call the crisis hotline, 911 and or go to the nearest ED for appropriate evaluation and treatment of symptoms. To follow-up with primary care provider for other medical issues, concerns and or health care needs  # Patient was discharged to [facility] on 08/09/2022   with a plan to follow up as noted below.    Physical Findings: AIMS: N/A CIWA:  CIWA-Ar Total: 0 COWS:  N/A  Musculoskeletal: Strength & Muscle Tone: within normal limits Gait & Station: normal Patient leans: N/A   Psychiatric Specialty Exam:  Presentation  General Appearance:  Fairly Groomed; Casual  Eye Contact: Fair  Speech: Clear and Coherent  Speech Volume: Normal  Handedness: Right   Mood and Affect  Mood: -- (Reports mood "anxious" due to discharge, otherwsie reports improved mood)  Affect: Congruent; Appropriate   Thought Process  Thought Processes: Coherent  Descriptions of Associations:Intact  Orientation:Full (Time, Place and Person)  Thought Content:Logical  History of Schizophrenia/Schizoaffective disorder:No  Duration of Psychotic Symptoms:Less than six  months  Hallucinations:Hallucinations: None  Ideas of Reference:None  Suicidal Thoughts:Suicidal Thoughts: No  Homicidal Thoughts:Homicidal Thoughts: No   Sensorium  Memory: Immediate Fair; Recent Fair  Judgment: Fair  Insight: Fair   Community education officer  Concentration: Fair  Attention Span: Fair  Recall: [place] of Knowledge: Fair  Language: Fair   Psychomotor Activity  Psychomotor Activity:Psychomotor Activity: Normal   Assets  Assets: Desire for Improvement; Financial Resources/Insurance; Leisure Time; Social Support   Sleep  Sleep:Number of Hours of Sleep: 4.75    Physical Exam: Physical Exam Constitutional:      General: She is not in acute distress.    Appearance: Normal appearance. She is not ill-appearing.  HENT:     Head: Normocephalic and atraumatic.  Pulmonary:     Effort: Pulmonary effort is normal. No respiratory distress.  Neurological:     General: No focal deficit present.     Mental Status: She is alert and oriented to person, place, and time.     Cranial Nerves: No cranial nerve deficit.  Psychiatric:        Mood and Affect: Mood normal.        Behavior: Behavior normal.        Thought Content: Thought content normal.        Judgment: Judgment normal.    Review of Systems  Constitutional: Negative.   Respiratory:  Negative for cough and shortness of breath.   Cardiovascular:  Negative for chest pain.  Gastrointestinal:  Negative for abdominal pain.  Neurological:  Negative for tremors and weakness.  Psychiatric/Behavioral:  Negative for depression, hallucinations, memory loss, substance abuse and suicidal ideas. The patient is not nervous/anxious and does not have insomnia.    Blood pressure 106/87, pulse (!) 102, temperature 98 F (36.7 C), temperature source Oral, resp. rate 20, height 5\' 2"  (1.575 m), weight 68.5 kg, last menstrual period 05/17/2014, SpO2 99 %. Body mass index is 27.62 kg/m.   Social History    Tobacco Use  Smoking Status Every Day   Packs/day: 0.50   Types: Cigarettes  Smokeless Tobacco Never   Tobacco Cessation:  A prescription for an FDA-approved tobacco cessation medication provided at discharge   Blood Alcohol level:  Lab Results  Component Value Date   ETH 112 (H) 08/01/2022   ETH 32 (H) [id number]    Metabolic Disorder Labs:  Lab Results  Component Value Date   HGBA1C 5.6 08/03/2022   MPG 114 08/03/2022   MPG 116.89 03/22/2022   No results found for: "PROLACTIN" Lab Results  Component Value Date   CHOL 245 (H) 08/03/2022   TRIG 181 (H) 08/03/2022   HDL 79 08/03/2022   CHOLHDL 3.1 08/03/2022   VLDL 36 08/03/2022   LDLCALC 130 (H) 08/03/2022   LDLCALC 112 (H) 03/22/2022    See Psychiatric Specialty Exam and Suicide Risk Assessment completed by Attending Physician prior to discharge.  Discharge destination:  [place]  Is patient on multiple antipsychotic therapies at discharge:  No   Has Patient had three or more failed trials of antipsychotic monotherapy by history:  No  Recommended Plan for Multiple Antipsychotic Therapies: NA  Discharge Instructions     Diet - low sodium  heart healthy   Complete by: As directed    Increase activity slowly   Complete by: As directed       Allergies as of 08/09/2022   No Known Allergies      Medication List     STOP taking these medications    cyclobenzaprine 10 MG tablet Commonly known as: FLEXERIL   gabapentin 600 MG tablet Commonly known as: NEURONTIN Replaced by: gabapentin 300 MG capsule   hydrOXYzine 25 MG tablet Commonly known as: ATARAX   levETIRAcetam 1000 MG tablet Commonly known as: KEPPRA   loperamide 2 MG capsule Commonly known as: IMODIUM   LORazepam 1 MG tablet Commonly known as: ATIVAN   multivitamin with minerals Tabs tablet   OLANZapine zydis 5 MG disintegrating tablet Commonly known as: ZYPREXA Replaced by: OLANZapine 10 MG tablet   ondansetron 4 MG  disintegrating tablet Commonly known as: ZOFRAN-ODT   sertraline 100 MG tablet Commonly known as: ZOLOFT   thiamine 100 MG tablet Commonly known as: Vitamin B-1   traZODone 100 MG tablet Commonly known as: DESYREL   ziprasidone 20 MG injection Commonly known as: Geodon       TAKE these medications      Indication  acetaminophen 325 MG tablet Commonly known as: TYLENOL Take 2 tablets (650 mg total) by mouth every 6 (six) hours as needed for mild pain.  Indication: Pain   FLUoxetine 40 MG capsule Commonly known as: PROZAC Take 1 capsule (40 mg total) by mouth daily. Start taking on: August 10, 2022  Indication: Depression   gabapentin 300 MG capsule Commonly known as: NEURONTIN Take 1 capsule (300 mg total) by mouth 3 (three) times daily. Replaces: gabapentin 600 MG tablet  Indication: Abuse or Misuse of Alcohol   nicotine polacrilex 2 MG gum Commonly known as: NICORETTE Take 1 each (2 mg total) by mouth as needed for smoking cessation.  Indication: Nicotine Addiction   OLANZapine 10 MG tablet Commonly known as: ZYPREXA Take 1 tablet (10 mg total) by mouth at bedtime. Replaces: OLANZapine zydis 5 MG disintegrating tablet  Indication: Major Depressive Disorder        Follow-up Information     [facility] Follow up on 08/19/2022.   Specialty: Behavioral Health Why: You have an appointment for therapy services with Azalia Bilis 08/19/22 at 10:00 am.  You also have an appointment for medication management services on 08/19/22 at 11:00 am with Burt Ek.  These appointments will be Virtual video. Contact information: [facility] up.   Why: A referral has been made for you,  Please contact this agency to follow-up on this referral to assist you with applying for Disability Services. Contact information: Address: [address] [place],  [place] [address], [place]  Email: [email or web address]  Phone: [phone] Ext. 401 Fax: [phone]        Services, [facility]. Go to.   Why: Please go to this provider to obtain an assessment for therapy and medication management services.  * Address:  [address] [place], [place]. Contact information: [address] [place] [address] [phone]                 Follow-up recommendations:    Activity: as tolerated  Diet: heart healthy  Other: -Follow-up with your outpatient psychiatric provider -instructions on appointment date, time, and address (location) are provided to you  in discharge paperwork.  -Take your psychiatric medications as prescribed at discharge - instructions are provided to you in the discharge paperwork  -Follow-up with outpatient primary care doctor and other specialists -for management of preventative medicine and chronic medical disease, including: none  -Testing: Follow-up with outpatient provider for abnormal lab results:  Elevated blood glucose- 246, with normal A1C 5.6 (08/03/2022) Repeat fasting Lipid Panel, elevated Cholesterol-245, TG-181,  LDL-130   -Recommend abstinence from alcohol, tobacco, and other illicit drug use at discharge.   -If your psychiatric symptoms recur, worsen, or if you have side effects to your psychiatric medications, call your outpatient psychiatric provider, 911, [phone] or go to the nearest emergency department.  -If suicidal thoughts recur, call your outpatient psychiatric provider, 911, [phone] or go to the nearest emergency department.    Signed: Christene Slates, MD 08/09/2022, 10:58 AM

## 2022-08-09 NOTE — Progress Notes (Signed)
Patient did not attend morning orientation/goal's group. 

## 2022-08-09 NOTE — BHH Suicide Risk Assessment (Signed)
Suicide Risk Assessment  Discharge Assessment    [facility] Discharge Suicide Risk Assessment   Principal Problem: MDD (major depressive disorder), recurrent, severe, with psychosis (HCC) Discharge Diagnoses: Principal Problem:   MDD (major depressive disorder), recurrent, severe, with psychosis (HCC)   Total Time spent with patient: 20 minutes  Theresa Rios is a 56 yo female with past psychiatric history significant for major depressive disorder, cocaine use disorder alcohol use disorder, seizure disorder, who initially presented to this facility for suicidal ideation with plans of overdosing on medications accompanied by command auditory hallucinations, and admitted to this facility for treatment and management of her major depressive disorder with psychotic features, anxiety, and substance use disorders as mentioned above.   During the patient's hospitalization, patient had extensive initial psychiatric evaluation, and follow-up psychiatric evaluations every day.   Psychiatric diagnoses provided upon initial assessment:  -Major depressive disorder, recurrent, severe, with psychotic features. -Generalized anxiety disorder -Cocaine use disorder -Alcohol use disorder   Patient's psychiatric medications were adjusted on admission:  - INCREASED Zyprexa 5 mg to 10 mg PO daily, for her depression and psychosis - CONTINUED Gabapentin 300 mg TID AT STARTING DOSE for seizure prophylaxis - STARTED Thiamine 100 mg daily, for her alcohol use disorder - INCREASED Prozac 10 mg to 20 mg daily, for her depressive symptoms - STARTED CIWA protocol with Ativan PRN, for score >10 - STARTED Nicotine gum, for smoking cessation   During the hospitalization, no other adjustments were made to the patient's psychiatric medication regimen.   Patient's care was discussed during the interdisciplinary team meeting every day during the hospitalization.   The patient denies having side effects to prescribed  psychiatric medication.   Gradually, patient started adjusting to milieu. The patient was evaluated each day by a clinical provider to ascertain response to treatment. Improvement was noted by the patient's report of decreasing symptoms, improved sleep and appetite, affect, medication tolerance, behavior, and participation in unit programming.  Patient was asked each day to complete a self inventory noting mood, mental status, pain, new symptoms, anxiety and concerns.     Symptoms were reported as significantly decreased or resolved completely by discharge.    On day of discharge, the patient reports that their mood is stable and improved . The patient denied having suicidal thoughts for more than 48 hours prior to discharge.  Patient denies having homicidal thoughts.  Patient denies having auditory hallucinations.  Patient denies any visual hallucinations or other symptoms of psychosis. The patient was motivated to continue taking medication with a goal of continued improvement in mental health.    The patient reports their target psychiatric symptoms of depression, suicidal thoughts and auditory hallucinations all responded well to the psychiatric medications, and the patient reports overall benefit other psychiatric hospitalization. Supportive psychotherapy was provided to the patient. The patient also participated in regular group therapy while hospitalized. Coping skills, problem solving as well as relaxation therapies were also part of the unit programming.   Labs were reviewed with the patient, and abnormal results were discussed with the patient.   The patient is able to verbalize their individual safety plan to this provider.   # It is recommended to the patient to continue psychiatric medications as prescribed, after discharge from the hospital.     # It is recommended to the patient to follow up with your outpatient psychiatric provider and PCP.   # It was discussed with the patient, the  impact of alcohol, drugs, tobacco have been there  overall psychiatric and medical wellbeing, and total abstinence from substance use was recommended the patient.ed.   # Prescriptions provided or sent directly to preferred pharmacy at discharge. Patient agreeable to plan. Given opportunity to ask questions. Appears to feel comfortable with discharge.    # In the event of worsening symptoms, the patient is instructed to call the crisis hotline, 911 and or go to the nearest ED for appropriate evaluation and treatment of symptoms. To follow-up with primary care provider for other medical issues, concerns and or health care needs   # Patient was discharged to [facility] on 08/09/2022   with a plan to follow up as noted below.      Physical Findings: AIMS: N/A  CIWA:  CIWA-Ar Total: 0 COWS: N/A   Musculoskeletal: Strength & Muscle Tone: within normal limits Gait & Station: normal Patient leans: N/A  Psychiatric Specialty Exam  Presentation  General Appearance:  Fairly Groomed; Casual  Eye Contact: Fair  Speech: Clear and Coherent  Speech Volume: Normal  Handedness: Right   Mood and Affect  Mood: -- (Reports mood "anxious" due to discharge, otherwsie reports improved mood)  Duration of Depression Symptoms: Greater than two weeks  Affect: Congruent; Appropriate   Thought Process  Thought Processes: Coherent  Descriptions of Associations:Intact  Orientation:Full (Time, Place and Person)  Thought Content:Logical  History of Schizophrenia/Schizoaffective disorder:No  Duration of Psychotic Symptoms:Less than six months  Hallucinations:Hallucinations: None  Ideas of Reference:None  Suicidal Thoughts:Suicidal Thoughts: No  Homicidal Thoughts:Homicidal Thoughts: No   Sensorium  Memory: Immediate Fair; Recent Fair  Judgment: Fair  Insight: Fair   Art therapist  Concentration: Fair  Attention Span: Fair  Recall: [employer] of  Knowledge: Fair  Language: Fair   Psychomotor Activity  Psychomotor Activity: Psychomotor Activity: Normal   Assets  Assets: Desire for Improvement; Financial Resources/Insurance; Leisure Time; Social Support   Sleep  Sleep: Number of Hours of Sleep: 4.75   Physical Exam: See Discharge Summary Review of Systems: See discharge summary  Blood pressure 106/87, pulse (!) 102, temperature 98 F (36.7 C), temperature source Oral, resp. rate 20, height 5\' 2"  (1.575 m), weight 68.5 kg, last menstrual period 05/17/2014, SpO2 99 %. Body mass index is 27.62 kg/m.  Mental Status Per Nursing Assessment::   On Admission:  Suicidal ideation indicated by patient  Demographic factors:  Low socioeconomic status, Unemployed Current Mental Status:  Suicidal ideation indicated by patient Loss Factors:  Financial problems / change in socioeconomic status Historical Factors:  Prior suicide attempts, Impulsivity Risk Reduction Factors:  Sense of responsibility to family  Continued Clinical Symptoms:  Depression: Comorbid alcohol abuse/dependence Hopelessness Insomnia Dysthymia Alcohol/Substance Abuse/Dependencies  Cognitive Features That Contribute To Risk:  Thought constriction (tunnel vision)    Suicide Risk:  Mild: There are no identifiable plans, no associated intent, mild dysphoria and related symptoms, good self-control (both objective and subjective assessment), few other risk factors, and identifiable protective factors, including available and accessible social support.   Follow-up Information     [facility] Follow up on 08/19/2022.   Specialty: Behavioral Health Why: You have an appointment for therapy services with 14/07/2022 08/19/22 at 10:00 am.  You also have an appointment for medication management services on 08/19/22 at 11:00 am with 14/11/23.  These appointments will be Virtual video. Contact information: [address] [place] [phone]        [facility] Follow up.   Why: A  referral has been made for you,  Please contact this agency to follow-up on this referral to assist you with applying for Disability Services. Contact information: Address: [address], [place], [place] [address], [place]  Email: [email or web address]  Phone: [phone] Ext. [phone] Fax: [phone]        Services, [facility]. Go to.   Why: Please go to this provider to obtain an assessment for therapy and medication management services.  * Address:  [address] [place], [place]. Contact information: [address] [place] [address] [phone]                 Plan Of Care/Follow-up recommendations:  Activity: as tolerated   Diet: heart healthy   Other: -Follow-up with your outpatient psychiatric provider -instructions on appointment date, time, and address (location) are provided to you in discharge paperwork.   -Take your psychiatric medications as prescribed at discharge - instructions are provided to you in the discharge paperwork   -Follow-up with outpatient primary care doctor and other specialists -for management of preventative medicine and chronic medical disease, including: none   -Testing: Follow-up with outpatient provider for abnormal lab results:  Elevated blood glucose- 246, with normal A1C 5.6 (08/03/2022) Repeat fasting Lipid Panel, elevated Cholesterol-245, TG-181,  LDL-130    -Recommend abstinence from alcohol, tobacco, and other illicit drug use at discharge.    -If your psychiatric symptoms recur, worsen, or if you have side effects to your psychiatric medications, call your outpatient psychiatric provider, 911, [phone] or go to the nearest emergency department.   -If suicidal thoughts recur, call your outpatient psychiatric provider, 911, [phone] or go to the nearest emergency department.   Lorri Frederick, MD 08/09/2022, 11:41 AM

## 2022-08-19 ENCOUNTER — Encounter (HOSPITAL_COMMUNITY): Payer: Self-pay

## 2022-08-19 ENCOUNTER — Ambulatory Visit (HOSPITAL_COMMUNITY): Payer: Self-pay | Admitting: Mental Health

## 2022-08-19 ENCOUNTER — Ambulatory Visit (HOSPITAL_COMMUNITY): Payer: Self-pay | Admitting: Psychiatry

## 2022-08-31 ENCOUNTER — Other Ambulatory Visit: Payer: Self-pay | Admitting: Psychiatry

## 2022-08-31 ENCOUNTER — Other Ambulatory Visit: Payer: Self-pay

## 2022-08-31 ENCOUNTER — Encounter (HOSPITAL_COMMUNITY): Payer: Self-pay | Admitting: Psychiatry

## 2022-08-31 ENCOUNTER — Inpatient Hospital Stay (HOSPITAL_COMMUNITY)
Admission: AD | Admit: 2022-08-31 | Discharge: 2022-09-06 | DRG: 885 | Disposition: A | Discharge status: Home / Self Care | Payer: Federal, State, Local not specified - Other | Attending: Psychiatry | Admitting: Psychiatry

## 2022-08-31 DIAGNOSIS — Z79899 Other long term (current) drug therapy: Secondary | ICD-10-CM

## 2022-08-31 DIAGNOSIS — Z1152 Encounter for screening for COVID-19: Secondary | ICD-10-CM

## 2022-08-31 DIAGNOSIS — R569 Unspecified convulsions: Secondary | ICD-10-CM | POA: Diagnosis present

## 2022-08-31 DIAGNOSIS — Z6829 Body mass index (BMI) 29.0-29.9, adult: Secondary | ICD-10-CM | POA: Diagnosis not present

## 2022-08-31 DIAGNOSIS — F332 Major depressive disorder, recurrent severe without psychotic features: Secondary | ICD-10-CM

## 2022-08-31 DIAGNOSIS — F411 Generalized anxiety disorder: Secondary | ICD-10-CM | POA: Diagnosis present

## 2022-08-31 DIAGNOSIS — G4700 Insomnia, unspecified: Secondary | ICD-10-CM | POA: Diagnosis present

## 2022-08-31 DIAGNOSIS — F1210 Cannabis abuse, uncomplicated: Secondary | ICD-10-CM | POA: Diagnosis present

## 2022-08-31 DIAGNOSIS — F1010 Alcohol abuse, uncomplicated: Secondary | ICD-10-CM | POA: Diagnosis present

## 2022-08-31 DIAGNOSIS — F29 Unspecified psychosis not due to a substance or known physiological condition: Secondary | ICD-10-CM | POA: Diagnosis present

## 2022-08-31 DIAGNOSIS — Z9151 Personal history of suicidal behavior: Secondary | ICD-10-CM

## 2022-08-31 DIAGNOSIS — Z5900 Homelessness unspecified: Secondary | ICD-10-CM

## 2022-08-31 DIAGNOSIS — E669 Obesity, unspecified: Secondary | ICD-10-CM | POA: Diagnosis present

## 2022-08-31 DIAGNOSIS — F1410 Cocaine abuse, uncomplicated: Secondary | ICD-10-CM | POA: Diagnosis present

## 2022-08-31 DIAGNOSIS — R45851 Suicidal ideations: Secondary | ICD-10-CM | POA: Diagnosis present

## 2022-08-31 DIAGNOSIS — F17210 Nicotine dependence, cigarettes, uncomplicated: Secondary | ICD-10-CM | POA: Diagnosis present

## 2022-08-31 LAB — RESP PANEL BY RT-PCR (RSV, FLU A&B, COVID)  RVPGX2
Influenza A by PCR: NEGATIVE
Influenza B by PCR: NEGATIVE
Resp Syncytial Virus by PCR: NEGATIVE
SARS Coronavirus 2 by RT PCR: NEGATIVE

## 2022-08-31 MED ORDER — ACETAMINOPHEN 325 MG PO TABS
650.0000 mg | ORAL_TABLET | Freq: Four times a day (QID) | ORAL | Status: DC | PRN
  Administered 2022-09-02: 650 mg via ORAL
  Filled 2022-08-31: qty 2

## 2022-08-31 MED ORDER — NICOTINE 14 MG/24HR TD PT24
14.0000 mg | MEDICATED_PATCH | Freq: Every day | TRANSDERMAL | Status: DC
  Filled 2022-08-31 (×8): qty 1

## 2022-08-31 MED ORDER — ALUM & MAG HYDROXIDE-SIMETH 200-200-20 MG/5ML PO SUSP: 30.0000 mL | ORAL | Status: DC | PRN

## 2022-08-31 MED ORDER — MAGNESIUM HYDROXIDE 400 MG/5ML PO SUSP: 30.0000 mL | Freq: Every day | ORAL | Status: DC | PRN

## 2022-08-31 NOTE — Tx Team (Signed)
Initial Treatment Plan 08/31/2022 6:48 PM Theresa Rios [id number]    PATIENT STRESSORS: Financial difficulties   Substance abuse     PATIENT STRENGTHS: Ability for insight  Communication skills    PATIENT IDENTIFIED PROBLEMS: Suicidal ideation                      DISCHARGE CRITERIA:  Ability to meet basic life and health needs Adequate post-discharge living arrangements  PRELIMINARY DISCHARGE PLAN: Return to previous living arrangement  PATIENT/FAMILY INVOLVEMENT: This treatment plan has been presented to and reviewed with the patient, Theresa Rios. The patient and family have been given the opportunity to ask questions and make suggestions.  Cherre Blanc, RN 08/31/2022, 6:48 PM

## 2022-08-31 NOTE — Progress Notes (Signed)
Adult Psychoeducational Group Note  Date:  08/31/2022 Time:  10:39 PM  Group Topic/Focus:  Wrap-Up Group:   The focus of this group is to help patients review their daily goal of treatment and discuss progress on daily workbooks.  Participation Level:  Did Not Attend  Participation Quality:   Did Not Attend  Affect:   Did Not Attend  Cognitive:   Did Not Attend  Insight: None  Engagement in Group:   Did Not Attend  Modes of Intervention:   Did Not Attend   Additional Comments:  Pt was encouraged to attend wrap up group but did not attend.  Felipa Furnace 08/31/2022, 10:39 PM

## 2022-08-31 NOTE — H&P (Signed)
Behavioral Health Medical Screening Exam  Theresa Rios is an 56 y.o. female with a past psychiatric history of major depressive disorder recurrent severe with psychosis, alcohol use disorder, alcohol abuse, drug intoxication with complications, suicidal ideations, and suicidal attempt who presented to [facility] voluntarily for assessment of depression, suicidal ideations with intent to overdose on pills.   She reports history of depression and suicidal attempts in the past. Recently discharged from [facility] 08/09/22 with multiple admissions over past year with similar presentation. Reports increased depression and suicidal ideations over the past week triggered by the holidays. Plan to attempt with 'pills' she has in her possession. Reports x2 attempts in the past. Endorses minimal family relationship and supports. Currently smokes 1/2 ppd >20 years, drinks 4-5 cans of beer/day. Denies any active illicit substance use; history of cocaine use.  She endorses active suicidal ideations with plan and is unable to contract for safety. Inpatient hospitalization recommended.     Total Time spent with patient: 30 minutes  Psychiatric Specialty Exam: Physical Exam Vitals and nursing note reviewed.  Constitutional:      Appearance: She is obese.  HENT:     Head: Normocephalic.     Nose: Nose normal.     Mouth/Throat:     Mouth: Mucous membranes are moist.     Pharynx: Oropharynx is clear.  Cardiovascular:     Rate and Rhythm: Tachycardia present.     Pulses: Normal pulses.  Pulmonary:     Effort: Pulmonary effort is normal.  Musculoskeletal:        General: Normal range of motion.     Cervical back: Normal range of motion.  Skin:    General: Skin is warm and dry.  Neurological:     Mental Status: She is alert and oriented to person, place, and time.  Psychiatric:        Mood and Affect: Mood is depressed. Affect is flat.        Speech: Speech is delayed.        Behavior: Behavior is withdrawn.  Behavior is cooperative.        Thought Content: Thought content is not paranoid or delusional. Thought content includes suicidal ideation. Thought content does not include homicidal ideation. Thought content includes suicidal plan. Thought content does not include homicidal plan.        Cognition and Memory: Cognition and memory normal.        Judgment: Judgment is impulsive and inappropriate.    Review of Systems  Psychiatric/Behavioral:  Positive for decreased concentration, dysphoric mood, sleep disturbance and suicidal ideas.   All other systems reviewed and are negative.  Blood pressure 121/63, pulse 95, temperature 99 F (37.2 C), temperature source Oral, resp. rate 18, last menstrual period 05/17/2014, SpO2 95 %.There is no height or weight on file to calculate BMI. General Appearance: Casual Eye Contact:  Fair Speech:  Slow Volume:  Decreased Mood:  Depressed and Dysphoric Affect:  Blunt, Congruent, and Depressed Thought Process:  Linear Orientation:  Full (Time, Place, and Person) Thought Content:  Logical Suicidal Thoughts:  Yes.  with intent/plan Homicidal Thoughts:  No Memory:  Immediate;   Fair Recent;   Fair Judgement:  Fair Insight:  Fair Psychomotor Activity:  Normal Concentration: Concentration: Fair and Attention Span: Fair Recall:  [employer] of Knowledge:Fair Language: Fair Akathisia:  NA Handed:  Right AIMS (if indicated):    Assets:  Physical Health Resilience Sleep:     Musculoskeletal: Strength & Muscle Tone: within normal  limits Gait & Station: normal Patient leans: N/A  Blood pressure 121/63, pulse 95, temperature 99 F (37.2 C), temperature source Oral, resp. rate 18, last menstrual period 05/17/2014, SpO2 95 %.  [place] Scale:  Flowsheet Row OP Visit from 08/31/2022 in [facility] Admission (Discharged) from 08/02/2022 in [facility] ED from 08/01/2022 in [facility] CATEGORY High Risk Low Risk High Risk       Recommendations: Based on my evaluation the patient does not appear to have an emergency medical condition. Patient accepted to [facility] for admission.   Inda Merlin, NP 08/31/2022, 3:22 PM

## 2022-08-31 NOTE — Progress Notes (Addendum)
Patient is a 56 year old female who presented as a walk in to [facility] with complaints of increasing depression and SI. Pt reported that she was planning to OD on a bottle of Tylenol, but decided to dump the contents and come to [facility]. Pt has a hx of MDD, alcohol abuse, and previous suicidal attempts. Pt reported that she has been homeless, staying at various people's home, and has become increasingly depressed. Patient presented with depressed affect/mood, calm, cooperative behavior - answered questions logically and coherently during admission interview and assessment. VS monitored and recorded. Skin check performed with RN . Belongings searched and secured in locker. Admission paperwork completed and signed. Verbal understanding expressed.  Patient was oriented to unit and schedule.  Pt denies SI/HI/AVH at this time. Sandwich and PO fluids provided. Q 15 min checks initiated for safety.

## 2022-09-01 DIAGNOSIS — F332 Major depressive disorder, recurrent severe without psychotic features: Secondary | ICD-10-CM | POA: Diagnosis not present

## 2022-09-01 LAB — COMPREHENSIVE METABOLIC PANEL
ALT: 15 U/L (ref 0–44)
AST: 16 U/L (ref 15–41)
Albumin: 4.1 g/dL (ref 3.5–5.0)
Alkaline Phosphatase: 72 U/L (ref 38–126)
Anion gap: 7 (ref 5–15)
BUN: 16 mg/dL (ref 6–20)
CO2: 26 mmol/L (ref 22–32)
Calcium: 9.2 mg/dL (ref 8.9–10.3)
Chloride: 106 mmol/L (ref 98–111)
Creatinine, Ser: 0.63 mg/dL (ref 0.44–1.00)
GFR, Estimated: >60 mL/min (ref >60)
Glucose, Bld: 108 mg/dL — ABNORMAL HIGH (ref 70–99)
Potassium: 4.1 mmol/L (ref 3.5–5.1)
Sodium: 139 mmol/L (ref 135–145)
Total Bilirubin: 0.7 mg/dL (ref 0.3–1.2)
Total Protein: 7.6 g/dL (ref 6.5–8.1)

## 2022-09-01 LAB — LIPID PANEL
Cholesterol: 245 mg/dL — ABNORMAL HIGH (ref 0–200)
HDL: 89 mg/dL (ref >40)
LDL Cholesterol: 130 mg/dL — ABNORMAL HIGH (ref 0–99)
Total CHOL/HDL Ratio: 2.8 RATIO
Triglycerides: 128 mg/dL (ref <150)
VLDL: 26 mg/dL (ref 0–40)

## 2022-09-01 LAB — SARS CORONAVIRUS 2 BY RT PCR: SARS Coronavirus 2 by RT PCR: NEGATIVE

## 2022-09-01 LAB — CBC
HCT: 42.5 % (ref 36.0–46.0)
Hemoglobin: 13.4 g/dL (ref 12.0–15.0)
MCH: 27.6 pg (ref 26.0–34.0)
MCHC: 31.5 g/dL (ref 30.0–36.0)
MCV: 87.6 fL (ref 80.0–100.0)
Platelets: 188 10*3/uL (ref 150–400)
RBC: 4.85 MIL/uL (ref 3.87–5.11)
RDW: 15.1 % (ref 11.5–15.5)
WBC: 11 10*3/uL — ABNORMAL HIGH (ref 4.0–10.5)
nRBC: 0 % (ref 0.0–0.2)

## 2022-09-01 MED ORDER — ADULT MULTIVITAMIN W/MINERALS CH
1.0000 | ORAL_TABLET | Freq: Every day | ORAL | Status: DC
  Administered 2022-09-01 – 2022-09-06 (×6): 1 via ORAL
  Filled 2022-09-01 (×7): qty 1

## 2022-09-01 MED ORDER — HALOPERIDOL 5 MG PO TABS: 5.0000 mg | ORAL_TABLET | Freq: Four times a day (QID) | ORAL | Status: DC | PRN

## 2022-09-01 MED ORDER — TRAZODONE HCL 50 MG PO TABS
50.0000 mg | ORAL_TABLET | Freq: Every evening | ORAL | Status: DC | PRN
  Administered 2022-09-01 – 2022-09-05 (×3): 50 mg via ORAL
  Filled 2022-09-01 (×3): qty 1

## 2022-09-01 MED ORDER — SERTRALINE HCL 50 MG PO TABS
50.0000 mg | ORAL_TABLET | Freq: Every day | ORAL | Status: DC
  Administered 2022-09-01 – 2022-09-06 (×6): 50 mg via ORAL
  Filled 2022-09-01: qty 7
  Filled 2022-09-01 (×9): qty 1

## 2022-09-01 MED ORDER — THIAMINE HCL 100 MG/ML IJ SOLN: 100.0000 mg | Freq: Every day | INTRAMUSCULAR | Status: DC

## 2022-09-01 MED ORDER — OLANZAPINE 5 MG PO TBDP
5.0000 mg | ORAL_TABLET | Freq: Every day | ORAL | Status: DC
  Administered 2022-09-01 – 2022-09-05 (×5): 5 mg via ORAL
  Filled 2022-09-01 (×6): qty 1
  Filled 2022-09-01: qty 7
  Filled 2022-09-01: qty 1

## 2022-09-01 MED ORDER — LORAZEPAM 1 MG PO TABS
1.0000 mg | ORAL_TABLET | Freq: Once | ORAL | Status: AC
  Administered 2022-09-01: 1 mg via ORAL
  Filled 2022-09-01: qty 1

## 2022-09-01 MED ORDER — VITAMIN B-1 100 MG PO TABS
100.0000 mg | ORAL_TABLET | Freq: Every day | ORAL | Status: DC
  Administered 2022-09-01 – 2022-09-06 (×6): 100 mg via ORAL
  Filled 2022-09-01 (×7): qty 1

## 2022-09-01 MED ORDER — BENZTROPINE MESYLATE 1 MG PO TABS: 1.0000 mg | ORAL_TABLET | Freq: Four times a day (QID) | ORAL | Status: DC | PRN

## 2022-09-01 MED ORDER — HYDROXYZINE HCL 25 MG PO TABS: 25.0000 mg | ORAL_TABLET | Freq: Three times a day (TID) | ORAL | Status: DC | PRN

## 2022-09-01 MED ORDER — LORAZEPAM 2 MG/ML IJ SOLN: 1.0000 mg | Freq: Once | INTRAMUSCULAR | Status: AC

## 2022-09-01 MED ORDER — ONDANSETRON HCL 4 MG PO TABS
4.0000 mg | ORAL_TABLET | Freq: Two times a day (BID) | ORAL | Status: DC
  Administered 2022-09-01 – 2022-09-06 (×7): 4 mg via ORAL
  Filled 2022-09-01 (×15): qty 1

## 2022-09-01 MED ORDER — FOLIC ACID 1 MG PO TABS
1.0000 mg | ORAL_TABLET | Freq: Every day | ORAL | Status: DC
  Administered 2022-09-01 – 2022-09-06 (×6): 1 mg via ORAL
  Filled 2022-09-01 (×7): qty 1

## 2022-09-01 MED ORDER — SERTRALINE HCL 25 MG PO TABS: 25.0000 mg | ORAL_TABLET | Freq: Every day | ORAL | Status: DC

## 2022-09-01 MED ORDER — LOPERAMIDE HCL 2 MG PO CAPS: 2.0000 mg | ORAL_CAPSULE | ORAL | Status: DC | PRN

## 2022-09-01 MED ORDER — GABAPENTIN 300 MG PO CAPS
300.0000 mg | ORAL_CAPSULE | Freq: Two times a day (BID) | ORAL | Status: DC
  Administered 2022-09-01 – 2022-09-06 (×10): 300 mg via ORAL
  Filled 2022-09-01 (×2): qty 1
  Filled 2022-09-01: qty 14
  Filled 2022-09-01: qty 1
  Filled 2022-09-01: qty 14
  Filled 2022-09-01 (×8): qty 1

## 2022-09-01 NOTE — Group Note (Unsigned)
Date:  09/01/2022 Time:  2:10 PM  Group Topic/Focus:  Goals Group:   The focus of this group is to help patients establish daily goals to achieve during treatment and discuss how the patient can incorporate goal setting into their daily lives to aide in recovery. Orientation:   The focus of this group is to educate the patient on the purpose and policies of crisis stabilization and provide a format to answer questions about their admission.  The group details unit policies and expectations of patients while admitted.     Participation Level:  {BHH PARTICIPATION LEVEL:22264}  Participation Quality:  {BHH PARTICIPATION QUALITY:22265}  Affect:  {BHH AFFECT:22266}  Cognitive:  {BHH COGNITIVE:22267}  Insight: {BHH Insight2:20797}  Engagement in Group:  {BHH ENGAGEMENT IN GROUP:22268}  Modes of Intervention:  {BHH MODES OF INTERVENTION:22269}  Additional Comments:  ***  Storey Stangeland Lashawn Shadavia Dampier 09/01/2022, 2:10 PM  

## 2022-09-01 NOTE — BHH Counselor (Signed)
Adult Comprehensive Assessment  Patient ID: Theresa Rios, female   DOB: 1965/11/22, 56 y.o.   MRN: [id number]  Information Source: Information source: Patient  Current Stressors:  Patient states their primary concerns and needs for treatment are:: "Suicidal" Patient states their goals for this hospitilization and ongoing recovery are:: "I don't know." Educational / Learning stressors: Denies stressors Employment / Job issues: Stressful because does not have a job. Family Relationships: Denies stressors but has recently shared that she has a sister in [place] that she cannot call on for help. Financial / Lack of resources (include bankruptcy): No income, very stressful. Housing / Lack of housing: Is homeless on the streets, very stressful. Physical health (include injuries & life threatening diseases): "Bad back pain" Social relationships: Has no supports in her life, feels very stressed by this. Substance abuse: Denies stressors Bereavement / Loss: Denies stressors  Living/Environment/Situation:  Living Arrangements: Other (Comment) (Homeless) Living conditions (as described by patient or guardian): On the streets Who else lives in the home?: Alone How long has patient lived in current situation?: Will not answer What is atmosphere in current home: Dangerous  Family History:  Marital status: Single Does patient have children?: No  Childhood History:  By whom was/is the patient raised?: Mother Additional childhood history information: No involvement from biological father Description of patient's relationship with caregiver when they were a child: "It was okay." Patient's description of current relationship with people who raised him/her: Mother is deceased How were you disciplined when you got in trouble as a child/adolescent?: "Normal" Does patient have siblings?: Yes Number of Siblings: 1 Description of patient's current relationship with siblings: Sister - "We don't have  a relationship." Did patient suffer any verbal/emotional/physical/sexual abuse as a child?: No Did patient suffer from severe childhood neglect?: No Has patient ever been sexually abused/assaulted/raped as an adolescent or adult?: No (States there were a lot of people in the household.) Was the patient ever a victim of a crime or a disaster?: No Witnessed domestic violence?: No Has patient been affected by domestic violence as an adult?: No  Education:  Highest grade of school patient has completed: High school diploma Currently a student?: No Learning disability?: No  Employment/Work Situation:   Employment Situation: Unemployed Publishing copy for disability for her mental health, has been waiting 2 months) Patient's Job has Been Impacted by Current Illness: No What is the Longest Time Patient has Held a Job?: "I don't know" Where was the Patient Employed at that Time?: [employer] Has Patient ever Been in the [employer]?: No  Financial Resources:   Financial resources: No income Does patient have a Lawyer or guardian?: No  Alcohol/Substance Abuse:   What has been your use of drugs/alcohol within the last 12 months?: Drinks alcohol occasionally, usually 3-4 beers.  Has used cocaine 1-2 times in the last year. Alcohol/Substance Abuse Treatment Hx: Denies past history Has alcohol/substance abuse ever caused legal problems?: No  Social Support System:   Forensic psychologist System: None Describe Community Support System: N/A Type of faith/religion: None How does patient's faith help to cope with current illness?: N/A  Leisure/Recreation:   Do You Have Hobbies?: No  Strengths/Needs:   What is the patient's perception of their strengths?: "I don't know" Patient states they can use these personal strengths during their treatment to contribute to their recovery: "I don't know" Patient states these barriers may affect/interfere with their treatment: Denies Patient states  these barriers may affect their return to the community: Denies  barriers Other important information patient would like considered in planning for their treatment: Denies  Discharge Plan:   Currently receiving community mental health services: No Patient states concerns and preferences for aftercare planning are: "I don't know" Patient states they will know when they are safe and ready for discharge when: "I don't know" Does patient have access to transportation?: No Does patient have financial barriers related to discharge medications?: Yes Patient description of barriers related to discharge medications: "I have no income to pay for medications, but I do have an orange card that can help me get discounted prices. " Plan for no access to transportation at discharge: Use the bus system Will patient be returning to same living situation after discharge?: Yes (Is homeless, on the streets -- would like to get help with locating resources so she does not have to go on the streets.)  Summary/Recommendations:   Summary and Recommendations (to be completed by the evaluator): Patient is a 56yo female who is hospitalized due to suicidal ideation with a plan and intent to overdose on medicine.  She was recently discharged from [facility] on 08/09/2022 and has had multiple admissions with similar presentation.  Patient reports being homeless on the streets for a significant period of time.  She has recently applied for disability and is awaiting results.  She does not have family or social supports.  She has a history of alcohol and cocaine abuse.  She denies having followed up with any outpatient providers following any of her discharges in the last year.  The patient would benefit from crisis stabilization, milieu participation, medication evaluation and management, group therapy, psychoeducation, safety monitoring, and discharge planning.  At discharge it is recommended that the patient adhere to the established  aftercare plan.  Lynnell Chad. 09/01/2022

## 2022-09-01 NOTE — Progress Notes (Signed)
Adult Psychoeducational Group Note  Date:  09/01/2022 Time:  10:50 PM  Group Topic/Focus:  Wrap-Up Group:   The focus of this group is to help patients review their daily goal of treatment and discuss progress on daily workbooks.  Participation Level:  Did Not Attend  Participation Quality:   n/a  Affect:   n/a  Cognitive:   n/a  Insight: None  Engagement in Group:   n/a  Modes of Intervention:   n/a  Additional Comments:   Pt did not attend the Wrap Up group.  Theresa Rios 09/01/2022, 10:50 PM

## 2022-09-01 NOTE — Group Note (Unsigned)
Date:  09/01/2022 Time:  2:12 PM  Group Topic/Focus:  Goals Group:   The focus of this group is to help patients establish daily goals to achieve during treatment and discuss how the patient can incorporate goal setting into their daily lives to aide in recovery. Orientation:   The focus of this group is to educate the patient on the purpose and policies of crisis stabilization and provide a format to answer questions about their admission.  The group details unit policies and expectations of patients while admitted.     Participation Level:  {[facility] PARTICIPATION LEVEL:22264}  Participation Quality:  {[facility] PARTICIPATION QUALITY:22265}  Affect:  {[facility] AFFECT:22266}  Cognitive:  {[facility] COGNITIVE:22267}  Insight: {[facility] Insight2:20797}  Engagement in Group:  {[facility] ENGAGEMENT IN GROUP:22268}  Modes of Intervention:  {[facility] MODES OF INTERVENTION:22269}  Additional Comments:  ***  Kateryna Grantham Lashawn Benjamine Strout 09/01/2022, 2:12 PM  

## 2022-09-01 NOTE — Group Note (Signed)
Date:  09/01/2022 Time:  5:57 PM  Group Topic/Focus:  Dimensions of Wellness:   The focus of this group is to introduce the topic of wellness and discuss the role each dimension of wellness plays in total health.    Participation Level:  Did Not Attend  Participation Quality:      Affect:      Cognitive:      Insight: None  Engagement in Group:      Modes of Intervention:      Additional Comments:  Did not attend.   Reymundo Poll 09/01/2022, 5:57 PM

## 2022-09-01 NOTE — Group Note (Signed)
[facility] LCSW Group Therapy Note  Date/Time:  09/01/2022   Type of Therapy and Topic:  Group could not be held today.  A handout to read on the topic of developing a healthy support system was given to each patient.  They were encouraged to discuss this with their peers.  Handout:  A handout entitled "Developing Your Support System" was provided.  This handout discusses the benefits of a social support system, how to sustain current relationships, some ideas for building a social support system, and why it is important to cultivate your social support system now.  Therapeutic Modalities:   Handout  Lilymae Swiech Grossman-Orr, LCSW        

## 2022-09-01 NOTE — [facility] Suicide Risk Assessment (Cosign Needed Addendum)
Suicide Risk Assessment  Admission Assessment    [facility] Admission Suicide Risk Assessment   Nursing information obtained from:  Patient Demographic factors:  Low socioeconomic status, Unemployed, Living alone Current Mental Status:  Suicidal ideation indicated by patient Loss Factors:  Financial problems / change in socioeconomic status Historical Factors:  Prior suicide attempts Risk Reduction Factors:  NA  Total Time spent with patient: 1 hour Principal Problem: MDD (major depressive disorder), recurrent episode, severe (HCC) Diagnosis:  Principal Problem:   MDD (major depressive disorder), recurrent episode, severe (HCC)  Subjective Data:  Patient reports feeling severely depressed, anxious, worthless, helpless, hopeless in the context of homelessness, being alone, without having any family or social support. She reports suicidal ideation with a plan to overdose on medications. She also reports she heard "voices" that "telling me to hurt myself, take the pills". Her last admission here was in August 02, 2022, and was discharged in August 09, 2022.  She reports not taking her medication, unclear how long ago. She also stopped seeing her outpatient psychiatrist and counselor. She appears calm with flat affect and occasional thought blocking.  She reports feeling safe here in the hospital and says her voices resolves when she is taking the medication (Zyprexa).  Zyprexa was restarted, and her sleep and appetite had improved too. She says that before admission, she was not sleeping and not eating well. She reports intermittently using cocaine. She denies heavy drinking and states she took a couple of drinks prior to admission. She denies any substance withdrawal symptoms currently. She denies homicidal thoughts, SIB or HI. She denies any current physical complaints.  Based on symptoms presented below, patient is admitted for mood stabilization, medication management and safety.    Continued  Clinical Symptoms:  Alcohol Use Disorder Identification Test Final Score (AUDIT): 11 The "Alcohol Use Disorders Identification Test", Guidelines for Use in Primary Care, Second Edition.  World Science writer [facility]). Score between 0-7:  no or low risk or alcohol related problems. Score between 8-15:  moderate risk of alcohol related problems. Score between 16-19:  high risk of alcohol related problems. Score 20 or above:  warrants further diagnostic evaluation for alcohol dependence and treatment.  CLINICAL FACTORS:   Depression:   Hopelessness Impulsivity Insomnia Alcohol/Substance Abuse/Dependencies More than one psychiatric diagnosis Previous Psychiatric Diagnoses and Treatments  Musculoskeletal: Strength & Muscle Tone: within normal limits Gait & Station: normal Patient leans: N/A  Psychiatric Specialty Exam:  Presentation  General Appearance:  Appropriate for Environment; Casual; Fairly Groomed  Eye Contact: Good  Speech: Clear and Coherent; Normal Rate  Speech Volume: Normal  Handedness: Right  Mood and Affect  Mood: Anxious; Depressed  Affect: Appropriate; Congruent  Thought Process  Thought Processes: Coherent  Descriptions of Associations:Intact  Orientation:Full (Time, Place and Person)  Thought Content:Logical  History of Schizophrenia/Schizoaffective disorder:No  Duration of Psychotic Symptoms:Less than six months  Hallucinations:Hallucinations: None  Ideas of Reference:None  Suicidal Thoughts:Suicidal Thoughts: No  Homicidal Thoughts:Homicidal Thoughts: No  Sensorium  Memory: Immediate Fair  Judgment: Fair  Insight: Fair  Art therapist  Concentration: Fair  Attention Span: Fair  Recall: [employer] of Knowledge: Fair  Language: Fair  Psychomotor Activity  Psychomotor Activity: Psychomotor Activity: Normal  Assets  Assets: Communication Skills; Physical Health  Sleep  Sleep: Sleep: Good Number  of Hours of Sleep: 6  Physical Exam: Physical Exam Vitals reviewed.  Cardiovascular:     Rate and Rhythm: Tachycardia present.     Comments: Blood pressure 135/108,  pulse 107.  Nursing staff to recheck vital signs   Review of Systems  Constitutional: Negative.   HENT: Negative.    Eyes: Negative.   Respiratory: Negative.    Cardiovascular: Negative.        Blood pressure 135/108, pulse 107.  Nursing staff to recheck vital signs  Gastrointestinal: Negative.   Genitourinary: Negative.   Musculoskeletal: Negative.   Skin: Negative.   Neurological: Negative.   Endo/Heme/Allergies: Negative.   Psychiatric/Behavioral:  Positive for depression, substance abuse and suicidal ideas. The patient is nervous/anxious and has insomnia.    Blood pressure (!) 135/108, pulse (!) 107, temperature 98.9 F (37.2 C), temperature source Oral, resp. rate 16, height 5\' 2"  (1.575 m), weight 72.1 kg, last menstrual period 05/17/2014, SpO2 96 %. Body mass index is 29.08 kg/m.  COGNITIVE FEATURES THAT CONTRIBUTE TO RISK:  Polarized thinking    SUICIDE RISK:   Moderate:  Frequent suicidal ideation with limited intensity, and duration, some specificity in terms of plans, no associated intent, good self-control, limited dysphoria/symptomatology, some risk factors present, and identifiable protective factors, including available and accessible social support.  PLAN OF CARE: Treatment Plan Summary: Daily contact with patient to assess and evaluate symptoms and progress in treatment and Medication management   Observation Level/Precautions:  15 minute checks  Laboratory:  CBC Chemistry Profile HbAIC UDS UA  Psychotherapy: Therapeutic milieu  Medications: See MAR  Consultations: Not applicable  Discharge Concerns: Safety  Estimated LOS: 3 to 5 days  Other:      Physician Treatment Plan for Primary Diagnosis: MDD (major depressive disorder), recurrent episode, severe (HCC) Long Term Goal(s):  Improvement in symptoms so as ready for discharge   Short Term Goals: Ability to identify changes in lifestyle to reduce recurrence of condition will improve, Ability to verbalize feelings will improve, Ability to disclose and discuss suicidal ideas, Ability to demonstrate self-control will improve, Ability to identify and develop effective coping behaviors will improve, Ability to maintain clinical measurements within normal limits will improve, Compliance with prescribed medications will improve, and Ability to identify triggers associated with substance abuse/mental health issues will improve   Physician Treatment Plan for Secondary Diagnosis: Principal Problem:   MDD (major depressive disorder), recurrent episode, severe (HCC)   Ms. Fulco is a 56yo F with psychiatric history of major depressive disorder, alcohol use disorder, seizure disorder, who initially presented to ER with headache and disclosed suicidal ideation with plans of overdosing.    Diagnoses/ Active problems: -Major depressive disorder, recurrent, severe, with psychotic features. -Generalized anxiety disorder -Cocaine use disorder -Alcohol use disorder   PLAN:   Safety and Monitoring: continue inpatient psych admission; 15-minute checks; daily contact with patient to assess and evaluate symptoms and progress in treatment; psychoeducation.Vital signs: q12 hours. Precautions: suicide, elopement, and assault. Placed on room lock out for meals, snacks and groups.   Medications: -Restart olanzapine 5mg  PO QHS for depression and psychosis. -Restart gabapentin 300mg  PO TID for anxiety, seizure propx. -Restart Zoloft 50 mg p.o. daily for depression -Trazodone 50 mg p.o. as needed at bedtime for sleep -Hydroxyzine 25 mg p.o. 3 times daily as needed for anxiety -Nicotine NicoDerm CQ-dose in milligrams per 24 hours 14 mg transdermal every 24 hours for smoking cessation.    The risks/benefits/side-effects/alternatives to this  medication were discussed in detail with the patient and time was given for questions. The patient consents to medications.   -CIWA with MVI, thiamine and PRN Ativan for scores >10.   - Metabolic profile and  EKG monitoring obtained while on an atypical antipsychotic.   - Encouraged patient to participate in unit milieu and in scheduled group therapies    PRN medications:  acetaminophen, alum & mag hydroxide-simeth, hydrOXYzine, loperamide, LORazepam, magnesium hydroxide, ondansetron   I certify that inpatient services furnished can reasonably be expected to improve the patient's condition.   Cecilie Lowers, FNP 09/01/2022, 1:31 PM

## 2022-09-01 NOTE — Progress Notes (Signed)
   08/31/22 2200  Psych Admission Type (Psych Patients Only)  Admission Status Voluntary  Psychosocial Assessment  Patient Complaints Anxiety  Eye Contact Fair  Facial Expression Anxious  Affect Appropriate to circumstance  Speech Logical/coherent  Interaction Assertive  Motor Activity Slow  Appearance/Hygiene Unremarkable  Behavior Characteristics Appropriate to situation;Cooperative  Mood Depressed  Thought Process  Coherency WDL  Content WDL  Delusions None reported or observed  Perception WDL  Hallucination None reported or observed  Judgment WDL  Confusion None  Danger to Self  Current suicidal ideation? Denies  Self-Injurious Behavior No self-injurious ideation or behavior indicators observed or expressed   Agreement Not to Harm Self Yes  Description of Agreement Verbal  Danger to Others  Danger to Others None reported or observed

## 2022-09-01 NOTE — H&P (Addendum)
Psychiatric Admission Assessment Adult  Patient Identification: Theresa Rios MRN:  [id number] Date of Evaluation:  09/01/2022 Chief Complaint:  MDD (major depressive disorder), recurrent episode, severe (HCC) [F33.2] Principal Diagnosis: MDD (major depressive disorder), recurrent episode, severe (HCC)  Diagnosis:  Principal Problem:   MDD (major depressive disorder), recurrent episode, severe (HCC) Generalized anxiety disorder Cocaine use disorder Alcohol use disorder  History of Present Illness: Patient reports feeling severely depressed, anxious, worthless, helpless, hopeless in the context of homelessness, being alone, without having any family or social support. She reports suicidal ideation with a plan to overdose on medications. She also reports she heard "voices" that "telling me to hurt myself, take the pills". Her last admission here was in August 02, 2022, and was discharged in August 09, 2022.  She reports not taking her medication, unclear how long ago. She also stopped seeing her outpatient psychiatrist and counselor. She appears calm with flat affect and occasional thought blocking.  She reports feeling safe here in the hospital and says her voices resolves when she is taking the medication (Zyprexa).  Zyprexa was restarted, and her sleep and appetite had improved too. She says that before admission, she was not sleeping and not eating well. She reports intermittently using cocaine. She denies heavy drinking and states she took a couple of drinks prior to admission. She denies any substance withdrawal symptoms currently. She denies homicidal thoughts, SIB or HI. She denies any current physical complaints.  Based on symptoms presented below, patient is admitted for mood stabilization, medication management and safety.  Associated Signs/Symptoms:  Depression Symptoms:  depressed mood, fatigue, feelings of worthlessness/guilt, difficulty concentrating, hopelessness, suicidal  thoughts without plan, anxiety, loss of energy/fatigue,  (Hypo) Manic Symptoms:  Impulsivity, Irritable Mood,  Anxiety Symptoms:  Excessive Worry,  Psychotic Symptoms:   N/a  PTSD Symptoms: NA Total Time spent with patient: 1 hour  Past Psychiatric History: Previous psych diagnoses: MDD, GAD, alcohol use d/o, cocaine use d/o.Marland Kitchen Previous psychiatric hospitalizations: several, last to [facility] 08/02/22 to 12/01//2023. History of prior suicide attempts: "yes" Non-suicidal self-injurious behaviors: denies History of violence: denies Previous psych medication: Zyprexa, Gabapentin.   Is the patient at risk to self? Yes.    Has the patient been a risk to self in the past 6 months? Yes.    Has the patient been a risk to self within the distant past? Yes.    Is the patient a risk to others? No.  Has the patient been a risk to others in the past 6 months? No.  Has the patient been a risk to others within the distant past? No.   [place] Scale:  Flowsheet Row Admission (Current) [facility] Ad[facility] (Discharged) [facility] 08[facility]01/2022 [facility]  C-SSRS RISK CATEGORY High Risk Low Risk High Risk        Prior Inpatient Therapy: Yes.   If yes, describe admitted admitted to [facility] with current discharge on 08/09/2022 Prior Outpatient Therapy: Yes.   If yes, describe [facility] 05/10/2022, [facility] 05/10/2022, [facility] 05/14/2022.  Alcohol Screening: 1. How often do you have a drink containing alcohol?: 4 or more times a week 2. How many drinks containing alcohol do you have on a typical day when you are drinking?: 1 or 2 3. How often do you have six or more drinks on one  occasion?: Less than monthly AUDIT-C Score: 5 4. How often during the last year have you found that  you were not able to stop drinking once you had started?: Less than monthly 5. How often during the last year have you failed to do what was normally expected from you because of drinking?: Less than monthly 6. How often during the last year have you needed a first drink in the morning to get yourself going after a heavy drinking session?: Never 7. How often during the last year have you had a feeling of guilt of remorse after drinking?: Weekly 8. How often during the last year have you been unable to remember what happened the night before because you had been drinking?: Less than monthly 9. Have you or someone else been injured as a result of your drinking?: No 10. Has a relative or friend or a doctor or another health worker been concerned about your drinking or suggested you cut down?: No Alcohol Use Disorder Identification Test Final Score (AUDIT): 11 Alcohol Brief Interventions/Follow-up: Alcohol education/Brief advice  Substance Abuse History in the last 12 months:  Yes.    Consequences of Substance Abuse: Medical Consequences:  Sent to [facility] Family Consequences:  Homelessness  Previous Psychotropic Medications: Yes   Psychological Evaluations: Yes   Past Medical History:  Past Medical History:  Diagnosis Date   Seizures (HCC)    History reviewed. No pertinent surgical history.  Family History: History reviewed. No pertinent family history.  Family Psychiatric  History: Patient denies suicide in family members.   Tobacco Screening:  Social History   Tobacco Use  Smoking Status Every Day   Packs/day: 0.50   Types: Cigarettes  Smokeless Tobacco Never    BH Tobacco Counseling     Are you interested in Tobacco Cessation Medications?  Yes, implement Nicotene Replacement Protocol Counseled patient on smoking cessation:  Refused/Declined practical counseling Reason Tobacco Screening Not Completed: No value filed.       Social  History:  Social History   Substance and Sexual Activity  Alcohol Use Yes   Comment: drinks Sundays- sometimes during the week     Social History   Substance and Sexual Activity  Drug Use Yes   Types: Cocaine, Marijuana    Additional Social History:  Allergies:  No Known Allergies Lab Results:  Results for orders placed or performed during the hospital encounter of 08/31/22 (from the past 48 hour(s))  Resp panel by RT-PCR (RSV, Flu A&B, Covid) Anterior Nasal Swab     Status: None   Collection Time: 08/31/22  4:17 PM   Specimen: Anterior Nasal Swab  Result Value Ref Range   SARS Coronavirus 2 by RT PCR NEGATIVE NEGATIVE    Comment: (NOTE) SARS-CoV-2 target nucleic acids are NOT DETECTED.  The SARS-CoV-2 RNA is generally detectable in upper respiratory specimens during the acute phase of infection. The lowest concentration of SARS-CoV-2 viral copies this assay can detect is 138 copies/mL. A negative result does not preclude SARS-Cov-2 infection and should not be used as the sole basis for treatment or other patient management decisions. A negative result may occur with  improper specimen collection/handling, submission of specimen other than nasopharyngeal swab, presence of viral mutation(s) within the areas targeted by this assay, and inadequate number of viral copies(<138 copies/mL). A negative result must be combined with clinical observations, patient history, and epidemiological information. The expected result is Negative.  Fact Sheet for Patients:  BloggerCourse.com  Fact Sheet for Healthcare  Providers:  SeriousBroker.it  This test is no t yet approved or cleared by the [place] and  has been authorized for detection and/or diagnosis of SARS-CoV-2 by FDA under an Emergency Use Authorization (EUA). This EUA will remain  in effect (meaning this test can be used) for the duration of the COVID-19 declaration  under Section 564(b)(1) of the Act, 21 U.S.C.section 360bbb-3(b)(1), unless the authorization is terminated  or revoked sooner.       Influenza A by PCR NEGATIVE NEGATIVE   Influenza B by PCR NEGATIVE NEGATIVE    Comment: (NOTE) The Xpert Xpress SARS-CoV-2/FLU/RSV plus assay is intended as an aid in the diagnosis of influenza from Nasopharyngeal swab specimens and should not be used as a sole basis for treatment. Nasal washings and aspirates are unacceptable for Xpert Xpress SARS-CoV-2/FLU/RSV testing.  Fact Sheet for Patients: BloggerCourse.com  Fact Sheet for Healthcare Providers: SeriousBroker.it  This test is not yet approved or cleared by the [place] FDA and has been authorized for detection and/or diagnosis of SARS-CoV-2 by FDA under an Emergency Use Authorization (EUA). This EUA will remain in effect (meaning this test can be used) for the duration of the COVID-19 declaration under Section 564(b)(1) of the Act, 21 U.S.C. section 360bbb-3(b)(1), unless the authorization is terminated or revoked.     Resp Syncytial Virus by PCR NEGATIVE NEGATIVE    Comment: (NOTE) Fact Sheet for Patients: BloggerCourse.com  Fact Sheet for Healthcare Providers: SeriousBroker.it  This test is not yet approved or cleared by the [place] FDA and has been authorized for detection and/or diagnosis of SARS-CoV-2 by FDA under an Emergency Use Authorization (EUA). This EUA will remain in effect (meaning this test can be used) for the duration of the COVID-19 declaration under Section 564(b)(1) of the Act, 21 U.S.C. section 360bbb-3(b)(1), unless the authorization is terminated or revoked.  Performed at [facility], [address] [place], [place] [address]   SARS Coronavirus 2 by RT PCR (hospital order, performed in [facility] hospital lab) *cepheid single  result test* Anterior Nasal Swab     Status: None   Collection Time: 09/01/22  6:10 AM   Specimen: Anterior Nasal Swab  Result Value Ref Range   SARS Coronavirus 2 by RT PCR NEGATIVE NEGATIVE    Comment: (NOTE) SARS-CoV-2 target nucleic acids are NOT DETECTED.  The SARS-CoV-2 RNA is generally detectable in upper and lower respiratory specimens during the acute phase of infection. The lowest concentration of SARS-CoV-2 viral copies this assay can detect is 250 copies / mL. A negative result does not preclude SARS-CoV-2 infection and should not be used as the sole basis for treatment or other patient management decisions.  A negative result may occur with improper specimen collection / handling, submission of specimen other than nasopharyngeal swab, presence of viral mutation(s) within the areas targeted by this assay, and inadequate number of viral copies (<250 copies / mL). A negative result must be combined with clinical observations, patient history, and epidemiological information.  Fact Sheet for Patients:   RoadLapTop.co.za  Fact Sheet for Healthcare Providers: http://kim-miller.com/  This test is not yet approved or  cleared by the [place] FDA and has been authorized for detection and/or diagnosis of SARS-CoV-2 by FDA under an Emergency Use Authorization (EUA).  This EUA will remain in effect (meaning this test can be used) for the duration of the COVID-19 declaration under Section 564(b)(1) of the Act, 21 U.S.C. section 360bbb-3(b)(1), unless the authorization is  terminated or revoked sooner.  Performed at [facility], [address] [place], [place] [address]   CBC     Status: Abnormal   Collection Time: 09/01/22  6:35 AM  Result Value Ref Range   WBC 11.0 (H) 4.0 - 10.5 K/uL   RBC 4.85 3.87 - 5.11 MIL/uL   Hemoglobin 13.4 12.0 - 15.0 g/dL   HCT 47.842.5 29.536.0 - 62.146.0 %   MCV 87.6 80.0 - 100.0 fL    MCH 27.6 26.0 - 34.0 pg   MCHC 31.5 30.0 - 36.0 g/dL   RDW 30.815.1 65.711.5 - 84.615.5 %   Platelets 188 150 - 400 K/uL   nRBC 0.0 0.0 - 0.2 %    Comment: Performed at [facility], [address] [place], [place] [address]  Comprehensive metabolic panel     Status: Abnormal   Collection Time: 09/01/22  6:35 AM  Result Value Ref Range   Sodium 139 135 - 145 mmol/L   Potassium 4.1 3.5 - 5.1 mmol/L   Chloride 106 98 - 111 mmol/L   CO2 26 22 - 32 mmol/L   Glucose, Bld 108 (H) 70 - 99 mg/dL    Comment: Glucose reference range applies only to samples taken after fasting for at least 8 hours.   BUN 16 6 - 20 mg/dL   Creatinine, Ser 2.840.63 0.44 - 1.00 mg/dL   Calcium 9.2 8.9 - 13.210.3 mg/dL   Total Protein 7.6 6.5 - 8.1 g/dL   Albumin 4.1 3.5 - 5.0 g/dL   AST 16 15 - 41 U/L   ALT 15 0 - 44 U/L   Alkaline Phosphatase 72 38 - 126 U/L   Total Bilirubin 0.7 0.3 - 1.2 mg/dL   GFR, Estimated >44>60 >01>60 mL/min    Comment: (NOTE) Calculated using the CKD-EPI Creatinine Equation (2021)    Anion gap 7 5 - 15    Comment: Performed at [facility], [address] [place], [place] [address]  Lipid panel     Status: Abnormal   Collection Time: 09/01/22  6:35 AM  Result Value Ref Range   Cholesterol 245 (H) 0 - 200 mg/dL   Triglycerides 366128 <440<150 mg/dL   HDL 89 >34>40 mg/dL   Total CHOL/HDL Ratio 2.8 RATIO   VLDL 26 0 - 40 mg/dL   LDL Cholesterol 742130 (H) 0 - 99 mg/dL    Comment:        Total Cholesterol/HDL:CHD Risk Coronary Heart Disease Risk Table                     Men   Women  1/2 Average Risk   3.4   3.3  Average Risk       5.0   4.4  2 X Average Risk   9.6   7.1  3 X Average Risk  23.4   11.0        Use the calculated Patient Ratio above and the CHD Risk Table to determine the patient's CHD Risk.        ATP III CLASSIFICATION (LDL):  <100     mg/dL   Optimal  [id number]  mg/dL   Near or Above                    Optimal  130-159  mg/dL   Borderline  [id number]   mg/dL   High  >295>190     mg/dL   Very High Performed  at [facility], [address] [place], [place] [address]    Blood Alcohol level:  Lab Results  Component Value Date   ETH 112 (H) 08/01/2022   ETH 32 (H) 07/30/2022   Metabolic Disorder Labs:  Lab Results  Component Value Date   HGBA1C 5.6 08/03/2022   MPG 114 08/03/2022   MPG 116.89 03/22/2022   No results found for: "PROLACTIN" Lab Results  Component Value Date   CHOL 245 (H) 09/01/2022   TRIG 128 09/01/2022   HDL 89 09/01/2022   CHOLHDL 2.8 09/01/2022   VLDL 26 09/01/2022   LDLCALC 130 (H) 09/01/2022   LDLCALC 130 (H) 08/03/2022    Current Medications: Current Facility-Administered Medications  Medication Dose Route Frequency Provider Last Rate Last Admin   acetaminophen (TYLENOL) tablet 650 mg  650 mg Oral Q6H PRN Leevy-Johnson, Brooke A, NP       alum & mag hydroxide-simeth (MAALOX/MYLANTA) 200-200-20 MG/5ML suspension 30 mL  30 mL Oral Q4H PRN Leevy-Johnson, Brooke A, NP       magnesium hydroxide (MILK OF MAGNESIA) suspension 30 mL  30 mL Oral Daily PRN Leevy-Johnson, Brooke A, NP       nicotine (NICODERM CQ - dosed in mg/24 hours) patch 14 mg  14 mg Transdermal Daily Attiah, Nadir, MD       PTA Medications: Medications Prior to Admission  Medication Sig Dispense Refill Last Dose   cyclobenzaprine (FLEXERIL) 10 MG tablet Take 10 mg by mouth at bedtime.      gabapentin (NEURONTIN) 600 MG tablet Take 600 mg by mouth at bedtime.      sertraline (ZOLOFT) 100 MG tablet Take 100 mg by mouth at bedtime.      traZODone (DESYREL) 100 MG tablet Take 100 mg by mouth at bedtime.      Musculoskeletal: Strength & Muscle Tone: within normal limits Gait & Station: normal Patient leans: N/A  Psychiatric Specialty Exam:  Presentation  General Appearance:  Appropriate for Environment; Fairly Groomed; Casual  Eye Contact: Good  Speech: Clear and Coherent  Speech  Volume: Normal  Handedness: Right  Mood and Affect  Mood: Anxious; Depressed  Affect: Appropriate; Congruent  Thought Process  Thought Processes: Coherent  Duration of Psychotic Symptoms:N/A  Past Diagnosis of Schizophrenia or Psychoactive disorder: No  Descriptions of Associations:Intact  Orientation:Full (Time, Place and Person)  Thought Content:Logical  Hallucinations:Hallucinations: None  Ideas of Reference:None  Suicidal Thoughts:Suicidal Thoughts: No  Homicidal Thoughts:Homicidal Thoughts: No  Sensorium  Memory: Immediate Fair; Recent Fair  Judgment: Fair  Insight: Fair  Art therapist  Concentration: Fair  Attention Span: Fair  Recall: [employer] of Knowledge: Fair  Language: Fair  Psychomotor Activity  Psychomotor Activity: Psychomotor Activity: Normal  Assets  Assets: Communication Skills; Physical Health  Sleep  Sleep: Sleep: Good Number of Hours of Sleep: 6  Physical Exam: Physical Exam Vitals and nursing note reviewed.  Constitutional:      Appearance: She is obese.  HENT:     Head: Normocephalic.     Nose: Nose normal.     Mouth/Throat:     Mouth: Mucous membranes are moist.     Pharynx: Oropharynx is clear.  Eyes:     Extraocular Movements: Extraocular movements intact.     Conjunctiva/sclera: Conjunctivae normal.     Pupils: Pupils are equal, round, and reactive to light.  Cardiovascular:     Rate and Rhythm: Normal rate.     Pulses: Normal pulses.  Pulmonary:  Effort: Pulmonary effort is normal.  Abdominal:     Palpations: Abdomen is soft.  Genitourinary:    Comments: Deferred Musculoskeletal:        General: Normal range of motion.     Cervical back: Normal range of motion.  Skin:    General: Skin is warm.  Neurological:     General: No focal deficit present.     Mental Status: She is alert and oriented to person, place, and time.  Psychiatric:        Mood and Affect: Mood normal.         Behavior: Behavior normal.    Review of Systems  Constitutional: Negative.   HENT: Negative.    Eyes: Negative.   Respiratory: Negative.    Cardiovascular: Negative.   Gastrointestinal: Negative.   Genitourinary: Negative.   Musculoskeletal: Negative.   Skin: Negative.   Neurological: Negative.   Endo/Heme/Allergies: Negative.   Psychiatric/Behavioral:  Positive for depression. The patient is nervous/anxious.    Blood pressure (!) 135/108, pulse (!) 107, temperature 98.9 F (37.2 C), temperature source Oral, resp. rate 16, height  (1.575 m), weight 72.1 kg, last menstrual period 05/17/2014, SpO2 96 %. Body mass index is 29.08 kg/m.  Treatment Plan Summary: Daily contact with patient to assess and evaluate symptoms and progress in treatment and Medication management  Observation Level/Precautions:  15 minute checks  Laboratory:  CBC Chemistry Profile HbAIC UDS UA  Psychotherapy: Therapeutic milieu  Medications: See MAR  Consultations: Not applicable  Discharge Concerns: Safety  Estimated LOS: 3 to 5 days  Other:     Physician Treatment Plan for Primary Diagnosis: MDD (major depressive disorder), recurrent episode, severe (HCC) Long Term Goal(s): Improvement in symptoms so as ready for discharge  Short Term Goals: Ability to identify changes in lifestyle to reduce recurrence of condition will improve, Ability to verbalize feelings will improve, Ability to disclose and discuss suicidal ideas, Ability to demonstrate self-control will improve, Ability to identify and develop effective coping behaviors will improve, Ability to maintain clinical measurements within normal limits will improve, Compliance with prescribed medications will improve, and Ability to identify triggers associated with substance abuse/mental health issues will improve  Physician Treatment Plan for Secondary Diagnosis: Principal Problem:   MDD (major depressive disorder), recurrent episode, severe  (HCC)  Ms. Shedrick is a 56yo F with psychiatric history of major depressive disorder, alcohol use disorder, seizure disorder, who initially presented to ER with headache and disclosed suicidal ideation with plans of overdosing.    Diagnoses/ Active problems: -Major depressive disorder, recurrent, severe, with psychotic features. -Generalized anxiety disorder -Cocaine use disorder -Alcohol use disorder   PLAN:   Safety and Monitoring: continue inpatient psych admission; 15-minute checks; daily contact with patient to assess and evaluate symptoms and progress in treatment; psychoeducation.Vital signs: q12 hours. Precautions: suicide, elopement, and assault. Placed on room lock out for meals, snacks and groups.   Medications: -Restart olanzapine  PO QHS for depression and psychosis. -Restart gabapentin  PO TID for anxiety, seizure propx. -Restart Zoloft 50 mg p.o. daily for depression -Trazodone 50 mg p.o. as needed at bedtime for sleep -Hydroxyzine 25 mg p.o. 3 times daily as needed for anxiety -Nicotine NicoDerm CQ-dose in milligrams per 24 hours 14 mg transdermal every 24 hours for smoking cessation.   The risks/benefits/side-effects/alternatives to this medication were discussed in detail with the patient and time was given for questions. The patient consents to medications.   -CIWA with MVI, thiamine and PRN Ativan for  scores >10.   - Metabolic profile and EKG monitoring obtained while on an atypical antipsychotic.   - Encouraged patient to participate in unit milieu and in scheduled group therapies    PRN medications:  acetaminophen, alum & mag hydroxide-simeth, hydrOXYzine, loperamide, LORazepam, magnesium hydroxide, ondansetron  I certify that inpatient services furnished can reasonably be expected to improve the patient's condition.    Cecilie Lowers, FNP 12/24/202312:47 PM

## 2022-09-02 ENCOUNTER — Encounter (HOSPITAL_COMMUNITY): Payer: Self-pay

## 2022-09-02 DIAGNOSIS — F332 Major depressive disorder, recurrent severe without psychotic features: Secondary | ICD-10-CM | POA: Diagnosis not present

## 2022-09-02 NOTE — Progress Notes (Signed)
   09/02/22 1000  Psych Admission Type (Psych Patients Only)  Admission Status Voluntary  Psychosocial Assessment  Patient Complaints Anxiety;Isolation;Depression  Eye Contact Brief  Facial Expression Anxious  Affect Appropriate to circumstance  Speech Logical/coherent  Interaction Assertive  Motor Activity Slow  Appearance/Hygiene Improved  Behavior Characteristics Appropriate to situation  Mood Depressed  Thought Process  Coherency WDL  Content WDL  Delusions None reported or observed  Perception WDL  Hallucination None reported or observed  Judgment WDL  Confusion None  Danger to Self  Current suicidal ideation? Denies  Self-Injurious Behavior No self-injurious ideation or behavior indicators observed or expressed   Agreement Not to Harm Self Yes  Description of Agreement Verbal  Danger to Others  Danger to Others None reported or observed

## 2022-09-02 NOTE — Progress Notes (Cosign Needed Addendum)
[facility] MD Progress Note  09/02/2022 2:01 PM Theresa Rios  MRN:  [id number]  Subjective: Theresa Rios states, "I feel safe in the hospital.  My depression has greatly been reduced."  Reason for admission:  Patient reports feeling severely depressed, anxious, worthless, helpless, hopeless in the context of homelessness, being alone, without having any family or social support. She reports suicidal ideation with a plan to overdose on medications. She also reports she heard "voices" that "telling me to hurt myself, take the pills". Her last admission here was in August 02, 2022, and was discharged in August 09, 2022.  She reports not taking her medication, unclear how long ago. She also stopped seeing her outpatient psychiatrist and counselor. She appears calm with flat affect and occasional thought blocking.  She reports feeling safe here in the hospital and says her voices resolves when she is taking the medication (Zyprexa).  Zyprexa was restarted, and her sleep and appetite had improved too. She says that before admission, she was not sleeping and not eating well. She reports intermittently using cocaine. She denies heavy drinking and states she took a couple of drinks prior to admission. She denies any substance withdrawal symptoms currently. She denies homicidal thoughts, SIB or HI. She denies any current physical complaints.  Based on symptoms presented below, patient is admitted for mood stabilization, medication management and safety.   24-hour chart review: Vital signs reviewed within normal limit, except for elevated pulse of 108.  Patient only requested for trazodone last night for insomnia.  Patient is compliant with all scheduled medication except for refusing nicotine patch.  Today's assessment: Patient is seen and examined in [address] sitting on her bed.  Reports her mood is less depressed, and rates depression as 3/10 on a scale of 0-10, with 10 being the worst.  Further reports reduced  anxiety of 5/10 with 10 being the worst.  Observed with pleasant affect.  Reports that she is safe here at the hospital and she plans to be attending group activities and therapeutic milieu to improve her healing process.  Denies SI, HI, or AVH.  Does not appear to be responding to internal or external stimuli.  No paranoia or delusional ideations observed.  Personal hygiene appears fair.  Patient continues on treatment regimen as listed below without complaint of somatic discomfort.  Reiterates on risks / benefits /side-effects /alternatives to prescribed medication with the patient and time was given for questions. The patient consents again to medications.  Principal Problem: MDD (major depressive disorder), recurrent episode, severe (HCC)  Diagnosis: Principal Problem:   MDD (major depressive disorder), recurrent episode, severe (HCC)  Total Time spent with patient: 45 minutes  Past Psychiatric History: Previous psych diagnoses: MDD, GAD, alcohol use d/o, cocaine use d/o.Marland Kitchen Previous psychiatric hospitalizations: several, last to [facility] 08/02/22 to 12/01//2023. History of prior suicide attempts: "yes" Non-suicidal self-injurious behaviors: denies History of violence: denies Previous psych medication: Zyprexa, Gabapentin.   Past Medical History:  Past Medical History:  Diagnosis Date   Seizures (HCC)    History reviewed. No pertinent surgical history.  Family History: History reviewed. No pertinent family history.  Family Psychiatric  History:  Patient denies suicide in family members.   Social History:  Social History   Substance and Sexual Activity  Alcohol Use Yes   Comment: drinks Sundays- sometimes during the week     Social History   Substance and Sexual Activity  Drug Use Yes   Types: Cocaine, Marijuana    Social  History   Socioeconomic History   Marital status: Media planner    Spouse name: Not on file   Number of children: 0   Years of education: Not on file    Highest education level: Not on file  Occupational History   Not on file  Tobacco Use   Smoking status: Every Day    Packs/day: 0.50    Types: Cigarettes   Smokeless tobacco: Never  Vaping Use   Vaping Use: Never used  Substance and Sexual Activity   Alcohol use: Yes    Comment: drinks Sundays- sometimes during the week   Drug use: Yes    Types: Cocaine, Marijuana   Sexual activity: Not on file  Other Topics Concern   Not on file  Social History Narrative   Pt lives in [place] with a friend. Her husband is living with his mother. Pt recently lost her job and her place to stay.   Social Determinants of Health   Financial Resource Strain: Not on file  Food Insecurity: Food Insecurity Present (08/02/2022)   Hunger Vital Sign    Worried About Running Out of Food in the Last Year: Sometimes true    Ran Out of Food in the Last Year: Sometimes true  Transportation Needs: Unmet Transportation Needs (08/02/2022)   PRAPARE - Administrator, Civil Service (Medical): Yes    Lack of Transportation (Non-Medical): Yes  Physical Activity: Not on file  Stress: Not on file  Social Connections: Not on file   Additional Social History:    Sleep: Good  Appetite:  Good  Current Medications: Current Facility-Administered Medications  Medication Dose Route Frequency Provider Last Rate Last Admin   acetaminophen (TYLENOL) tablet 650 mg  650 mg Oral Q6H PRN Leevy-Johnson, Brooke A, NP   650 mg at 09/02/22 0750   alum & mag hydroxide-simeth (MAALOX/MYLANTA) 200-200-20 MG/5ML suspension 30 mL  30 mL Oral Q4H PRN Leevy-Johnson, Brooke A, NP       haloperidol (HALDOL) tablet 5 mg  5 mg Oral Q6H PRN Axten Pascucci, Jesusita Oka, FNP       And   benztropine (COGENTIN) tablet 1 mg  1 mg Oral Q6H PRN Osman Calzadilla, Jesusita Oka, FNP       folic acid (FOLVITE) tablet 1 mg  1 mg Oral Daily Diasia Henken, Jesusita Oka, FNP   1 mg at 09/02/22 0748   gabapentin (NEURONTIN) capsule 300 mg  300 mg Oral BID Cecilie Lowers, FNP   300 mg  at 09/02/22 4650   hydrOXYzine (ATARAX) tablet 25 mg  25 mg Oral TID PRN Sharmon Cheramie, Jesusita Oka, FNP       loperamide (IMODIUM) capsule 2 mg  2 mg Oral PRN Briseis Aguilera, Jesusita Oka, FNP       magnesium hydroxide (MILK OF MAGNESIA) suspension 30 mL  30 mL Oral Daily PRN Leevy-Johnson, Brooke A, NP       multivitamin with minerals tablet 1 tablet  1 tablet Oral Daily Johnnell Liou C, FNP   1 tablet at 09/02/22 0748   nicotine (NICODERM CQ - dosed in mg/24 hours) patch 14 mg  14 mg Transdermal Daily Attiah, Nadir, MD       OLANZapine zydis (ZYPREXA) disintegrating tablet 5 mg  5 mg Oral QHS Jamice Carreno C, FNP   5 mg at 09/01/22 2144   ondansetron (ZOFRAN) tablet 4 mg  4 mg Oral Q12H Breckyn Ticas C, FNP   4 mg at 09/02/22 0748   sertraline (ZOLOFT) tablet 50  mg  50 mg Oral Daily Ellice Boultinghouse, Lynna Zamorano, FNP   50 mg at 09/02/22 1610   thiamine (Vitamin B-1) tablet 100 mg  100 mg Oral Daily Cyan Moultrie, Danean Marner, FNP   100 mg at 09/02/22 9604   Or   thiamine (VITAMIN B1) injection 100 mg  100 mg Intravenous Daily Junius Faucett, Jesusita Oka, FNP       traZODone (DESYREL) tablet 50 mg  50 mg Oral QHS PRN Ger Nicks, Jesusita Oka, FNP   50 mg at 09/01/22 2144    Lab Results:  Results for orders placed or performed during the hospital encounter of 08/31/22 (from the past 48 hour(s))  Resp panel by RT-PCR (RSV, Flu A&B, Covid) Anterior Nasal Swab     Status: None   Collection Time: 08/31/22  4:17 PM   Specimen: Anterior Nasal Swab  Result Value Ref Range   SARS Coronavirus 2 by RT PCR NEGATIVE NEGATIVE    Comment: (NOTE) SARS-CoV-2 target nucleic acids are NOT DETECTED.  The SARS-CoV-2 RNA is generally detectable in upper respiratory specimens during the acute phase of infection. The lowest concentration of SARS-CoV-2 viral copies this assay can detect is 138 copies/mL. A negative result does not preclude SARS-Cov-2 infection and should not be used as the sole basis for treatment or other patient management decisions. A negative result may occur with  improper  specimen collection/handling, submission of specimen other than nasopharyngeal swab, presence of viral mutation(s) within the areas targeted by this assay, and inadequate number of viral copies(<138 copies/mL). A negative result must be combined with clinical observations, patient history, and epidemiological information. The expected result is Negative.  Fact Sheet for Patients:  BloggerCourse.com  Fact Sheet for Healthcare Providers:  SeriousBroker.it  This test is no t yet approved or cleared by the [place] FDA and  has been authorized for detection and/or diagnosis of SARS-CoV-2 by FDA under an Emergency Use Authorization (EUA). This EUA will remain  in effect (meaning this test can be used) for the duration of the COVID-19 declaration under Section 564(b)(1) of the Act, 21 U.S.C.section 360bbb-3(b)(1), unless the authorization is terminated  or revoked sooner.       Influenza A by PCR NEGATIVE NEGATIVE   Influenza B by PCR NEGATIVE NEGATIVE    Comment: (NOTE) The Xpert Xpress SARS-CoV-2/FLU/RSV plus assay is intended as an aid in the diagnosis of influenza from Nasopharyngeal swab specimens and should not be used as a sole basis for treatment. Nasal washings and aspirates are unacceptable for Xpert Xpress SARS-CoV-2/FLU/RSV testing.  Fact Sheet for Patients: BloggerCourse.com  Fact Sheet for Healthcare Providers: SeriousBroker.it  This test is not yet approved or cleared by the [place] FDA and has been authorized for detection and/or diagnosis of SARS-CoV-2 by FDA under an Emergency Use Authorization (EUA). This EUA will remain in effect (meaning this test can be used) for the duration of the COVID-19 declaration under Section 564(b)(1) of the Act, 21 U.S.C. section 360bbb-3(b)(1), unless the authorization is terminated or revoked.     Resp Syncytial Virus  by PCR NEGATIVE NEGATIVE    Comment: (NOTE) Fact Sheet for Patients: BloggerCourse.com  Fact Sheet for Healthcare Providers: SeriousBroker.it  This test is not yet approved or cleared by the [place] FDA and has been authorized for detection and/or diagnosis of SARS-CoV-2 by FDA under an Emergency Use Authorization (EUA). This EUA will remain in effect (meaning this test can be used) for the duration of the COVID-19 declaration under Section 564(b)(1) of  the Act, 21 U.S.C. section 360bbb-3(b)(1), unless the authorization is terminated or revoked.  Performed at [facility], [address] [place], [place] [address]   SARS Coronavirus 2 by RT PCR (hospital order, performed in [facility] hospital lab) *cepheid single result test* Anterior Nasal Swab     Status: None   Collection Time: 09/01/22  6:10 AM   Specimen: Anterior Nasal Swab  Result Value Ref Range   SARS Coronavirus 2 by RT PCR NEGATIVE NEGATIVE    Comment: (NOTE) SARS-CoV-2 target nucleic acids are NOT DETECTED.  The SARS-CoV-2 RNA is generally detectable in upper and lower respiratory specimens during the acute phase of infection. The lowest concentration of SARS-CoV-2 viral copies this assay can detect is 250 copies / mL. A negative result does not preclude SARS-CoV-2 infection and should not be used as the sole basis for treatment or other patient management decisions.  A negative result may occur with improper specimen collection / handling, submission of specimen other than nasopharyngeal swab, presence of viral mutation(s) within the areas targeted by this assay, and inadequate number of viral copies (<250 copies / mL). A negative result must be combined with clinical observations, patient history, and epidemiological information.  Fact Sheet for Patients:   RoadLapTop.co.zahttps://www.fda.gov/media/158405/download  Fact Sheet for Healthcare  Providers: http://kim-miller.com/https://www.fda.gov/media/158404/download  This test is not yet approved or  cleared by the [place] FDA and has been authorized for detection and/or diagnosis of SARS-CoV-2 by FDA under an Emergency Use Authorization (EUA).  This EUA will remain in effect (meaning this test can be used) for the duration of the COVID-19 declaration under Section 564(b)(1) of the Act, 21 U.S.C. section 360bbb-3(b)(1), unless the authorization is terminated or revoked sooner.  Performed at [facility], [address] [place], [place] [address]   CBC     Status: Abnormal   Collection Time: 09/01/22  6:35 AM  Result Value Ref Range   WBC 11.0 (H) 4.0 - 10.5 K/uL   RBC 4.85 3.87 - 5.11 MIL/uL   Hemoglobin 13.4 12.0 - 15.0 g/dL   HCT 09.842.5 11.936.0 - 14.746.0 %   MCV 87.6 80.0 - 100.0 fL   MCH 27.6 26.0 - 34.0 pg   MCHC 31.5 30.0 - 36.0 g/dL   RDW 82.915.1 56.211.5 - 13.015.5 %   Platelets 188 150 - 400 K/uL   nRBC 0.0 0.0 - 0.2 %    Comment: Performed at [facility], [address] [place], [place] [address]  Comprehensive metabolic panel     Status: Abnormal   Collection Time: 09/01/22  6:35 AM  Result Value Ref Range   Sodium 139 135 - 145 mmol/L   Potassium 4.1 3.5 - 5.1 mmol/L   Chloride 106 98 - 111 mmol/L   CO2 26 22 - 32 mmol/L   Glucose, Bld 108 (H) 70 - 99 mg/dL    Comment: Glucose reference range applies only to samples taken after fasting for at least 8 hours.   BUN 16 6 - 20 mg/dL   Creatinine, Ser 4.690.63 0.44 - 1.00 mg/dL   Calcium 9.2 8.9 - 62.910.3 mg/dL   Total Protein 7.6 6.5 - 8.1 g/dL   Albumin 4.1 3.5 - 5.0 g/dL   AST 16 15 - 41 U/L   ALT 15 0 - 44 U/L   Alkaline Phosphatase 72 38 - 126 U/L   Total Bilirubin 0.7 0.3 - 1.2 mg/dL   GFR, Estimated >52>60 >84>60 mL/min  Comment: (NOTE) Calculated using the CKD-EPI Creatinine Equation (2021)    Anion gap 7 5 - 15    Comment: Performed at [facility], [address]  [place], [place] [address]  Lipid panel     Status: Abnormal   Collection Time: 09/01/22  6:35 AM  Result Value Ref Range   Cholesterol 245 (H) 0 - 200 mg/dL   Triglycerides 983 <382 mg/dL   HDL 89 >50 mg/dL   Total CHOL/HDL Ratio 2.8 RATIO   VLDL 26 0 - 40 mg/dL   LDL Cholesterol 539 (H) 0 - 99 mg/dL    Comment:        Total Cholesterol/HDL:CHD Risk Coronary Heart Disease Risk Table                     Men   Women  1/2 Average Risk   3.4   3.3  Average Risk       5.0   4.4  2 X Average Risk   9.6   7.1  3 X Average Risk  23.4   11.0        Use the calculated Patient Ratio above and the CHD Risk Table to determine the patient's CHD Risk.        ATP III CLASSIFICATION (LDL):  <100     mg/dL   Optimal  767-341  mg/dL   Near or Above                    Optimal  130-159  mg/dL   Borderline  937-902  mg/dL   High  >409     mg/dL   Very High Performed at [facility], [address], [place], [place] [address]     Blood Alcohol level:  Lab Results  Component Value Date   ETH 112 (H) 08/01/2022   ETH 32 (H) 07/30/2022    Metabolic Disorder Labs: Lab Results  Component Value Date   HGBA1C 5.6 08/03/2022   MPG 114 08/03/2022   MPG 116.89 03/22/2022   No results found for: "PROLACTIN" Lab Results  Component Value Date   CHOL 245 (H) 09/01/2022   TRIG 128 09/01/2022   HDL 89 09/01/2022   CHOLHDL 2.8 09/01/2022   VLDL 26 09/01/2022   LDLCALC 130 (H) 09/01/2022   LDLCALC 130 (H) 08/03/2022    Physical Findings: AIMS:  , ,  ,  ,    CIWA:    COWS:     Musculoskeletal: Strength & Muscle Tone: within normal limits Gait & Station: normal Patient leans: N/A  Psychiatric Specialty Exam:  Presentation  General Appearance:  Appropriate for Environment; Casual; Fairly Groomed  Eye Contact: Good  Speech: Clear and Coherent; Normal Rate  Speech Volume: Normal  Handedness: Right  Mood and Affect  Mood: Depressed;  Anxious  Affect: Congruent; Appropriate  Thought Process  Thought Processes: Coherent  Descriptions of Associations:Intact  Orientation:Full (Time, Place and Person)  Thought Content:Logical  History of Schizophrenia/Schizoaffective disorder:No  Duration of Psychotic Symptoms:Less than six months  Hallucinations:Hallucinations: None  Ideas of Reference:None  Suicidal Thoughts:Suicidal Thoughts: No  Homicidal Thoughts:Homicidal Thoughts: No  Sensorium  Memory: Immediate Fair; Recent Fair  Judgment: Fair  Insight: Fair  Art therapist  Concentration: Good  Attention Span: Good  Recall: Fair  Fund of Knowledge: Fair  Language: Good  Psychomotor Activity  Psychomotor Activity: Psychomotor Activity: Normal  Assets  Assets: Communication Skills; Desire for Improvement; Physical Health; Resilience  Sleep  Sleep: Sleep: Good Number of Hours of Sleep: 7  Physical Exam: Physical Exam Vitals and nursing note reviewed.  Constitutional:      Appearance: Normal appearance.  HENT:     Head: Normocephalic.     Nose: Nose normal.     Mouth/Throat:     Mouth: Mucous membranes are moist.     Pharynx: Oropharynx is clear.  Eyes:     Conjunctiva/sclera: Conjunctivae normal.     Pupils: Pupils are equal, round, and reactive to light.  Cardiovascular:     Rate and Rhythm: Normal rate.     Pulses: Normal pulses.  Pulmonary:     Effort: Pulmonary effort is normal.  Abdominal:     Palpations: Abdomen is soft.  Genitourinary:    Comments: Deferred Musculoskeletal:        General: Normal range of motion.     Cervical back: Normal range of motion.  Skin:    General: Skin is warm.  Neurological:     General: No focal deficit present.     Mental Status: She is alert and oriented to person, place, and time.  Psychiatric:        Mood and Affect: Mood normal.        Behavior: Behavior normal.    Review of Systems  Constitutional: Negative.    HENT: Negative.    Eyes: Negative.   Respiratory: Negative.    Cardiovascular: Negative.   Gastrointestinal: Negative.   Genitourinary: Negative.   Musculoskeletal: Negative.   Skin: Negative.   Neurological: Negative.   Endo/Heme/Allergies: Negative.   Psychiatric/Behavioral:  Positive for depression. The patient is nervous/anxious.    Blood pressure 108/77, pulse (!) 108, temperature 98.7 F (37.1 C), temperature source Oral, resp. rate 16, height  (1.575 m), weight 72.1 kg, last menstrual period 05/17/2014, SpO2 96 %. Body mass index is 29.08 kg/m.   Treatment Plan Summary: Daily contact with patient to assess and evaluate symptoms and progress in treatment and Medication management  Observation Level/Precautions:  15 minute checks  Laboratory:  CBC Chemistry Profile HbAIC UDS UA  Psychotherapy: Therapeutic milieu  Medications: See MAR  Consultations: Not applicable  Discharge Concerns: Safety  Estimated LOS: 3 to 5 days  Other:      Physician Treatment Plan for Primary Diagnosis: MDD (major depressive disorder), recurrent episode, severe (HCC) Long Term Goal(s): Improvement in symptoms so as ready for discharge   Short Term Goals: Ability to identify changes in lifestyle to reduce recurrence of condition will improve, Ability to verbalize feelings will improve, Ability to disclose and discuss suicidal ideas, Ability to demonstrate self-control will improve, Ability to identify and develop effective coping behaviors will improve, Ability to maintain clinical measurements within normal limits will improve, Compliance with prescribed medications will improve, and Ability to identify triggers associated with substance abuse/mental health issues will improve   Physician Treatment Plan for Secondary Diagnosis: Principal Problem:   MDD (major depressive disorder), recurrent episode, severe (HCC)   Theresa Rios is a 56yo F with psychiatric history of major depressive  disorder, alcohol use disorder, seizure disorder, who initially presented to ER with headache and disclosed suicidal ideation with plans of overdosing.    Diagnoses/ Active problems: -Major depressive disorder, recurrent, severe, with psychotic features. -Generalized anxiety disorder -Cocaine use disorder -Alcohol use disorder   PLAN: Safety and Monitoring: continue inpatient psych admission; 15-minute checks; daily contact with patient to assess and evaluate symptoms and progress in treatment; psychoeducation.Vital signs: q12 hours. Precautions:  suicide, elopement, and assault. Placed on room lock out for meals, snacks and groups.   Medications: -Restart olanzapine  PO QHS for depression and psychosis. -Restart gabapentin  PO TID for anxiety, seizure propx. -Restart Zoloft 50 mg p.o. daily for depression -Trazodone 50 mg p.o. as needed at bedtime for sleep -Hydroxyzine 25 mg p.o. 3 times daily as needed for anxiety -Nicotine NicoDerm CQ-dose in milligrams per 24 hours 14 mg transdermal every 24 hours for smoking cessation.    The risks/benefits/side-effects/alternatives to this medication were discussed in detail with the patient and time was given for questions. The patient consents to medications.   -CIWA with MVI, thiamine and PRN Ativan for scores >10.   - Metabolic profile and EKG monitoring obtained while on an atypical antipsychotic.   - Encouraged patient to participate in unit milieu and in scheduled group therapies    PRN medications:  acetaminophen, alum & mag hydroxide-simeth, hydrOXYzine, loperamide, LORazepam, magnesium hydroxide, ondansetron   I certify that inpatient services furnished can reasonably be expected to improve the patient's condition.   Cecilie Lowers, FNP 09/02/2022, 2:01 PM

## 2022-09-02 NOTE — Progress Notes (Signed)
   09/02/22 0200  Psych Admission Type (Psych Patients Only)  Admission Status Voluntary  Psychosocial Assessment  Patient Complaints Isolation  Eye Contact Brief  Facial Expression Anxious  Affect Appropriate to circumstance  Speech Logical/coherent  Interaction Assertive  Motor Activity Slow  Appearance/Hygiene Improved  Behavior Characteristics Appropriate to situation  Mood Depressed  Thought Process  Coherency WDL  Content WDL  Delusions None reported or observed  Perception WDL  Hallucination None reported or observed  Judgment WDL  Confusion None  Danger to Self  Current suicidal ideation? Denies  Self-Injurious Behavior No self-injurious ideation or behavior indicators observed or expressed   Agreement Not to Harm Self Yes  Description of Agreement Verbal  Danger to Others  Danger to Others None reported or observed

## 2022-09-02 NOTE — BH IP Treatment Plan (Signed)
Interdisciplinary Treatment and Diagnostic Plan Update  09/02/2022 Time of Session: 9:20 AM  Theresa Rios MRN: [id number]  Principal Diagnosis: MDD (major depressive disorder), recurrent episode, severe (HCC)  Secondary Diagnoses: Principal Problem:   MDD (major depressive disorder), recurrent episode, severe (HCC)   Current Medications:  Current Facility-Administered Medications  Medication Dose Route Frequency Provider Last Rate Last Admin   acetaminophen (TYLENOL) tablet 650 mg  650 mg Oral Q6H PRN Leevy-Johnson, Brooke A, NP   650 mg at 09/02/22 0750   alum & mag hydroxide-simeth (MAALOX/MYLANTA) 200-200-20 MG/5ML suspension 30 mL  30 mL Oral Q4H PRN Leevy-Johnson, Brooke A, NP       haloperidol (HALDOL) tablet 5 mg  5 mg Oral Q6H PRN Ntuen, Jesusita Oka, FNP       And   benztropine (COGENTIN) tablet 1 mg  1 mg Oral Q6H PRN Ntuen, Jesusita Oka, FNP       folic acid (FOLVITE) tablet 1 mg  1 mg Oral Daily Ntuen, Jesusita Oka, FNP   1 mg at 09/02/22 0748   gabapentin (NEURONTIN) capsule 300 mg  300 mg Oral BID Cecilie Lowers, FNP   300 mg at 09/02/22 6073   hydrOXYzine (ATARAX) tablet 25 mg  25 mg Oral TID PRN Ntuen, Jesusita Oka, FNP       loperamide (IMODIUM) capsule 2 mg  2 mg Oral PRN Ntuen, Jesusita Oka, FNP       magnesium hydroxide (MILK OF MAGNESIA) suspension 30 mL  30 mL Oral Daily PRN Leevy-Johnson, Brooke A, NP       multivitamin with minerals tablet 1 tablet  1 tablet Oral Daily Ntuen, Tina C, FNP   1 tablet at 09/02/22 0748   nicotine (NICODERM CQ - dosed in mg/24 hours) patch 14 mg  14 mg Transdermal Daily Attiah, Nadir, MD       OLANZapine zydis (ZYPREXA) disintegrating tablet 5 mg  5 mg Oral QHS Ntuen, Tina C, FNP   5 mg at 09/01/22 2144   ondansetron (ZOFRAN) tablet 4 mg  4 mg Oral Q12H Ntuen, Tina C, FNP   4 mg at 09/02/22 0748   sertraline (ZOLOFT) tablet 50 mg  50 mg Oral Daily Ntuen, Tina C, FNP   50 mg at 09/02/22 0748   thiamine (Vitamin B-1) tablet 100 mg  100 mg Oral Daily Ntuen, Tina C,  FNP   100 mg at 09/02/22 7106   Or   thiamine (VITAMIN B1) injection 100 mg  100 mg Intravenous Daily Ntuen, Jesusita Oka, FNP       traZODone (DESYREL) tablet 50 mg  50 mg Oral QHS PRN Ntuen, Jesusita Oka, FNP   50 mg at 09/01/22 2144   PTA Medications: Medications Prior to Admission  Medication Sig Dispense Refill Last Dose   cyclobenzaprine (FLEXERIL) 10 MG tablet Take 10 mg by mouth at bedtime.      gabapentin (NEURONTIN) 600 MG tablet Take 600 mg by mouth at bedtime.      sertraline (ZOLOFT) 100 MG tablet Take 100 mg by mouth at bedtime.      traZODone (DESYREL) 100 MG tablet Take 100 mg by mouth at bedtime.       Patient Stressors: Financial difficulties   Substance abuse    Patient Strengths: Ability for insight  Communication skills   Treatment Modalities: Medication Management, Group therapy, Case management,  1 to 1 session with clinician, Psychoeducation, Recreational therapy.   Physician Treatment Plan for Primary Diagnosis: MDD (major depressive disorder),  recurrent episode, severe (HCC) Long Term Goal(s): Improvement in symptoms so as ready for discharge   Short Term Goals: Ability to identify changes in lifestyle to reduce recurrence of condition will improve Ability to verbalize feelings will improve Ability to disclose and discuss suicidal ideas Ability to demonstrate self-control will improve Ability to identify and develop effective coping behaviors will improve Ability to maintain clinical measurements within normal limits will improve Compliance with prescribed medications will improve Ability to identify triggers associated with substance abuse/mental health issues will improve  Medication Management: Evaluate patient's response, side effects, and tolerance of medication regimen.  Therapeutic Interventions: 1 to 1 sessions, Unit Group sessions and Medication administration.  Evaluation of Outcomes: Not Progressing  Physician Treatment Plan for Secondary Diagnosis:  Principal Problem:   MDD (major depressive disorder), recurrent episode, severe (HCC)  Long Term Goal(s): Improvement in symptoms so as ready for discharge   Short Term Goals: Ability to identify changes in lifestyle to reduce recurrence of condition will improve Ability to verbalize feelings will improve Ability to disclose and discuss suicidal ideas Ability to demonstrate self-control will improve Ability to identify and develop effective coping behaviors will improve Ability to maintain clinical measurements within normal limits will improve Compliance with prescribed medications will improve Ability to identify triggers associated with substance abuse/mental health issues will improve     Medication Management: Evaluate patient's response, side effects, and tolerance of medication regimen.  Therapeutic Interventions: 1 to 1 sessions, Unit Group sessions and Medication administration.  Evaluation of Outcomes: Not Progressing   RN Treatment Plan for Primary Diagnosis: MDD (major depressive disorder), recurrent episode, severe (HCC) Long Term Goal(s): Knowledge of disease and therapeutic regimen to maintain health will improve  Short Term Goals: Ability to remain free from injury will improve, Ability to verbalize frustration and anger appropriately will improve, Ability to demonstrate self-control, Ability to participate in decision making will improve, Ability to verbalize feelings will improve, Ability to disclose and discuss suicidal ideas, Ability to identify and develop effective coping behaviors will improve, and Compliance with prescribed medications will improve  Medication Management: RN will administer medications as ordered by provider, will assess and evaluate patient's response and provide education to patient for prescribed medication. RN will report any adverse and/or side effects to prescribing provider.  Therapeutic Interventions: 1 on 1 counseling sessions,  Psychoeducation, Medication administration, Evaluate responses to treatment, Monitor vital signs and CBGs as ordered, Perform/monitor CIWA, COWS, AIMS and Fall Risk screenings as ordered, Perform wound care treatments as ordered.  Evaluation of Outcomes: Not Progressing   LCSW Treatment Plan for Primary Diagnosis: MDD (major depressive disorder), recurrent episode, severe (HCC) Long Term Goal(s): Safe transition to appropriate next level of care at discharge, Engage patient in therapeutic group addressing interpersonal concerns.  Short Term Goals: Engage patient in aftercare planning with referrals and resources, Increase social support, Increase ability to appropriately verbalize feelings, Increase emotional regulation, Facilitate acceptance of mental health diagnosis and concerns, Facilitate patient progression through stages of change regarding substance use diagnoses and concerns, Identify triggers associated with mental health/substance abuse issues, and Increase skills for wellness and recovery  Therapeutic Interventions: Assess for all discharge needs, 1 to 1 time with Social worker, Explore available resources and support systems, Assess for adequacy in community support network, Educate family and significant other(s) on suicide prevention, Complete Psychosocial Assessment, Interpersonal group therapy.  Evaluation of Outcomes: Not Progressing   Progress in Treatment: Attending groups: No. Participating in groups: No. Taking medication as prescribed:  Yes. Toleration medication: Yes. Family/Significant other contact made: No, will contact:  Will follow up with patient to see who she would like for CSW to follow up with.  Patient understands diagnosis: Yes. Discussing patient identified problems/goals with staff: Yes. Medical problems stabilized or resolved: Yes. Denies suicidal/homicidal ideation: Yes. Issues/concerns per patient self-inventory: No.   New problem(s) identified: No,  Describe:  None reported   New Short Term/Long Term Goal(s):medication stabilization, elimination of SI thoughts, development of comprehensive mental wellness plan.    Patient Goals:  "Get back on my medications"  Discharge Plan or Barriers: Patient recently admitted. CSW will continue to follow and assess for appropriate referrals and possible discharge planning.    Reason for Continuation of Hospitalization: Anxiety Depression Medication stabilization Suicidal ideation Other; describe Homelessness   Estimated Length of Stay:2-4 days   Last 3 [place] Suicide Severity Risk Score: Flowsheet Row Admission (Current) [facility] Admission (Discharged) [facility]  C-SSRS RISK CATEGORY High Risk Low Risk High Risk       Last PHQ 2/9 Scores:    04/03/2022    8:34 AM  Depression screen PHQ 2/9  Decreased Interest 0  Down, Depressed, Hopeless 0  PHQ - 2 Score 0    Scribe for Treatment Team: Beather Arbour 09/02/2022 10:49 AM

## 2022-09-02 NOTE — [facility] Group Notes (Signed)
Patient attended the AA group. 

## 2022-09-02 NOTE — Group Note (Signed)
Recreation Therapy Group Note   Group Topic:Problem Solving  Group Date: 09/02/2022 Start Time: 0930 End Time: 1000 Facilitators: Malonie Tatum-McCall, LRT,CTRS Location: [address] Dayroom   Goal Area(s) Addresses:  Patient will effectively work in a team with other group members. Patient will verbalize importance of using appropriate problem solving techniques.  Patient will identify positive change associated with effective problem solving skills.    Group Description: Christmas Trivia.  Patients were divided into two teams.  LRT conducted three rounds of trivia with the patients.  The value of each question, was written beside each line were the answer was to be written.  After each round, patients will count of their points and write them at the bottom of the sheet.  At the end of the game, patients will add the totals of all three rounds together.  The team with the most points, wins the game.     Affect/Mood: N/A   Participation Level: Did not attend    Clinical Observations/Individualized Feedback:     Plan: Continue to engage patient in RT group sessions 2-3x/week.   Theresa Rios, LRT,CTRS 09/02/2022 12:53 PM

## 2022-09-02 NOTE — [facility] Group Notes (Signed)
Patient did not attend goals group.  

## 2022-09-03 DIAGNOSIS — F332 Major depressive disorder, recurrent severe without psychotic features: Secondary | ICD-10-CM | POA: Diagnosis not present

## 2022-09-03 MED ORDER — NICOTINE POLACRILEX 2 MG MT GUM
2.0000 mg | CHEWING_GUM | OROMUCOSAL | Status: DC | PRN
  Administered 2022-09-03 – 2022-09-06 (×7): 2 mg via ORAL
  Filled 2022-09-03 (×3): qty 1

## 2022-09-03 NOTE — Progress Notes (Signed)
D- Patient alert and oriented. Patient affect/mood.reported as improving.  Denies SI, HI, AVH, and pain. States that today has been a long day and that her anxiety and depression has been about 6/10, 10 being the highest level of anxiety and depression. Reports that she thinks it just could be the weather but she isnt sure. Reports "this admission is different than last time." However she wasn't able to go into details about how/why. States that "I just fell about into depression" A- Scheduled medications administered to patient, per MD orders. Support and encouragement provided.  Routine safety checks conducted every 15 minutes.  Patient informed to notify staff with problems or concerns. R- No adverse drug reactions noted. Patient contracts for safety at this time. Patient compliant with medications and treatment plan. Patient receptive, calm, and cooperative. Patient interacts well with others on the unit.  Patient remains safe at this time.

## 2022-09-03 NOTE — Progress Notes (Signed)
[facility] MD Progress Note  09/03/2022 2:10 PM Theresa Rios  MRN:  [id number]   HPI:  Theresa Rios is a 56 year old female with a past psychiatric history of major depressive disorder recurrent episode severe, suicide attempt by drug ingestion, cocaine use, alcohol use disorder, anxiety who presented to [facility] [address] voluntarily 08/31/22 for suicidal ideations with plan to overdose on medications where she was admitted to inpatient for further stabilization and treatment. Per chart review patient was recently discharged from [facility] 08/09/22 where she reports not taking medication or  following up with outpatient providers after discharge.   24-hour chart review: Vital signs reviewed and remain within normal limit, pulse remains slightly elevated but improved 108.  PRN Nicorette gum given for smoking cessation x1 and Acetaminophen 650 mg given for mild pain. Patient noted to be compliant with all scheduled medication except for refusing nicotine patch. Per chart review, patient noted to be intermittently visible in the milieu participating in group activities.   Today's assessment: Patient observed in dayroom interacting with peers and staff; watching television. She was assessed in room where she presents alert and oriented; noted change in demeanor while in assessment. Patient appears more withdrawn with some underlying irritability. Affect congruent. Thought content logical. She reports improvement in her mood; however rates her current depression  8/10 stating 'just life' regarding her situation outside of the hospital (homeless); anxiety as 0/10. She endorses some suicidal ideations this morning 'just life'; denies any specific plan or intent. She denies any HI, or AVH.  Does not appear to be responding to internal or external stimuli.  No paranoia or delusional ideations observed. Personal hygiene appears fair; patient did shower while on the unit.  Patient continues on treatment regimen as listed below without  noted side effects. Reiterates on risks / benefits /side-effects /alternatives to prescribed medication with the patient and time was given for questions. Provider attempted to discuss plans upon discharge where patient shrugged shoulders 'I don't know'.   Principal Problem: MDD (major depressive disorder), recurrent episode, severe (HCC)  Diagnosis: Principal Problem:   MDD (major depressive disorder), recurrent episode, severe (HCC)  Total Time spent with patient: 45 minutes  Past Psychiatric History: Previous psych diagnoses: MDD, GAD, alcohol use d/o, cocaine use d/o.Marland Kitchen Previous psychiatric hospitalizations: several, last to [facility] 08/02/22 to 12/01//2023. History of prior suicide attempts: "yes" Non-suicidal self-injurious behaviors: denies History of violence: denies Previous psych medication: Zyprexa, Gabapentin.   Past Medical History:  Past Medical History:  Diagnosis Date   Seizures (HCC)    History reviewed. No pertinent surgical history.  Family History: History reviewed. No pertinent family history.  Family Psychiatric  History:  Patient denies suicide in family members.   Social History:  Social History   Substance and Sexual Activity  Alcohol Use Yes   Comment: drinks Sundays- sometimes during the week     Social History   Substance and Sexual Activity  Drug Use Yes   Types: Cocaine, Marijuana    Social History   Socioeconomic History   Marital status: Media planner    Spouse name: Not on file   Number of children: 0   Years of education: Not on file   Highest education level: Not on file  Occupational History   Not on file  Tobacco Use   Smoking status: Every Day    Packs/day: 0.50    Types: Cigarettes   Smokeless tobacco: Never  Vaping Use   Vaping Use: Never used  Substance and  Sexual Activity   Alcohol use: Yes    Comment: drinks Sundays- sometimes during the week   Drug use: Yes    Types: Cocaine, Marijuana   Sexual activity: Not on file   Other Topics Concern   Not on file  Social History Narrative   Pt lives in [place] with a friend. Her husband is living with his mother. Pt recently lost her job and her place to stay.   Social Determinants of Health   Financial Resource Strain: Not on file  Food Insecurity: Food Insecurity Present (08/02/2022)   Hunger Vital Sign    Worried About Running Out of Food in the Last Year: Sometimes true    Ran Out of Food in the Last Year: Sometimes true  Transportation Needs: Unmet Transportation Needs (08/02/2022)   PRAPARE - Administrator, Civil Service (Medical): Yes    Lack of Transportation (Non-Medical): Yes  Physical Activity: Not on file  Stress: Not on file  Social Connections: Not on file   Additional Social History:    Sleep: Good  Appetite:  Good  Current Medications: Current Facility-Administered Medications  Medication Dose Route Frequency Provider Last Rate Last Admin   acetaminophen (TYLENOL) tablet 650 mg  650 mg Oral Q6H PRN Leevy-Johnson, Cabela Pacifico A, NP   650 mg at 09/02/22 0750   alum & mag hydroxide-simeth (MAALOX/MYLANTA) 200-200-20 MG/5ML suspension 30 mL  30 mL Oral Q4H PRN Leevy-Johnson, Astaria Nanez A, NP       haloperidol (HALDOL) tablet 5 mg  5 mg Oral Q6H PRN Ntuen, Jesusita Oka, FNP       And   benztropine (COGENTIN) tablet 1 mg  1 mg Oral Q6H PRN Ntuen, Jesusita Oka, FNP       folic acid (FOLVITE) tablet 1 mg  1 mg Oral Daily Ntuen, Jesusita Oka, FNP   1 mg at 09/03/22 1000   gabapentin (NEURONTIN) capsule 300 mg  300 mg Oral BID Ntuen, Jesusita Oka, FNP   300 mg at 09/03/22 1000   hydrOXYzine (ATARAX) tablet 25 mg  25 mg Oral TID PRN Ntuen, Jesusita Oka, FNP       loperamide (IMODIUM) capsule 2 mg  2 mg Oral PRN Ntuen, Jesusita Oka, FNP       magnesium hydroxide (MILK OF MAGNESIA) suspension 30 mL  30 mL Oral Daily PRN Leevy-Johnson, Stein Windhorst A, NP       multivitamin with minerals tablet 1 tablet  1 tablet Oral Daily Ntuen, Jesusita Oka, FNP   1 tablet at 09/03/22 1000   nicotine  (NICODERM CQ - dosed in mg/24 hours) patch 14 mg  14 mg Transdermal Daily Attiah, Nadir, MD       nicotine polacrilex (NICORETTE) gum 2 mg  2 mg Oral PRN Abbott Pao, Nadir, MD       OLANZapine zydis (ZYPREXA) disintegrating tablet 5 mg  5 mg Oral QHS Ntuen, Tina C, FNP   5 mg at 09/02/22 2105   ondansetron (ZOFRAN) tablet 4 mg  4 mg Oral Q12H Ntuen, Tina C, FNP   4 mg at 09/03/22 1000   sertraline (ZOLOFT) tablet 50 mg  50 mg Oral Daily Ntuen, Tina C, FNP   50 mg at 09/03/22 1001   thiamine (Vitamin B-1) tablet 100 mg  100 mg Oral Daily Ntuen, Tina C, FNP   100 mg at 09/03/22 1000   Or   thiamine (VITAMIN B1) injection 100 mg  100 mg Intravenous Daily Ntuen, Jesusita Oka, FNP  traZODone (DESYREL) tablet 50 mg  50 mg Oral QHS PRN Ntuen, Jesusita Oka, FNP   50 mg at 09/01/22 2144    Lab Results:  No results found for this or any previous visit (from the past 48 hour(s)).   Blood Alcohol level:  Lab Results  Component Value Date   ETH 112 (H) 08/01/2022   ETH 32 (H) 07/30/2022    Metabolic Disorder Labs: Lab Results  Component Value Date   HGBA1C 5.6 08/03/2022   MPG 114 08/03/2022   MPG 116.89 03/22/2022   No results found for: "PROLACTIN" Lab Results  Component Value Date   CHOL 245 (H) 09/01/2022   TRIG 128 09/01/2022   HDL 89 09/01/2022   CHOLHDL 2.8 09/01/2022   VLDL 26 09/01/2022   LDLCALC 130 (H) 09/01/2022   LDLCALC 130 (H) 08/03/2022    Physical Findings: AIMS:  , ,  ,  ,    CIWA:    COWS:     Musculoskeletal: Strength & Muscle Tone: within normal limits Gait & Station: normal Patient leans: N/A  Psychiatric Specialty Exam:  Presentation  General Appearance:  Appropriate for Environment; Casual  Eye Contact: Good  Speech: Clear and Coherent  Speech Volume: Normal  Handedness: Right  Mood and Affect  Mood: Dysphoric  Affect: Congruent  Thought Process  Thought Processes: Coherent  Descriptions of Associations:Intact  Orientation:Full (Time,  Place and Person)  Thought Content:Logical  History of Schizophrenia/Schizoaffective disorder:No  Duration of Psychotic Symptoms:Less than six months  Hallucinations:Hallucinations: None  Ideas of Reference:None  Suicidal Thoughts:Suicidal Thoughts: No  Homicidal Thoughts:Homicidal Thoughts: No  Sensorium  Memory: Immediate Good; Recent Good  Judgment: Fair  Insight: Fair  Art therapist  Concentration: Good  Attention Span: Good  Recall: Good  Fund of Knowledge: Fair  Language: Good  Psychomotor Activity  Psychomotor Activity: Psychomotor Activity: Normal  Assets  Assets: Communication Skills; Physical Health; Resilience  Sleep  Sleep: Sleep: Good Number of Hours of Sleep: 7  Physical Exam: Physical Exam Vitals and nursing note reviewed.  Constitutional:      Appearance: Normal appearance.  HENT:     Head: Normocephalic.     Nose: Nose normal.     Mouth/Throat:     Mouth: Mucous membranes are moist.     Pharynx: Oropharynx is clear.  Eyes:     Conjunctiva/sclera: Conjunctivae normal.     Pupils: Pupils are equal, round, and reactive to light.  Cardiovascular:     Rate and Rhythm: Normal rate.     Pulses: Normal pulses.  Pulmonary:     Effort: Pulmonary effort is normal.  Abdominal:     Palpations: Abdomen is soft.  Genitourinary:    Comments: Deferred Musculoskeletal:        General: Normal range of motion.     Cervical back: Normal range of motion.  Skin:    General: Skin is warm.  Neurological:     General: No focal deficit present.     Mental Status: She is alert and oriented to person, place, and time.  Psychiatric:        Mood and Affect: Mood normal.        Behavior: Behavior normal.        Thought Content: Thought content is not paranoid or delusional. Thought content includes suicidal ideation. Thought content does not include homicidal ideation. Thought content does not include homicidal or suicidal plan.     Review of Systems  Constitutional: Negative.   HENT: Negative.  Eyes: Negative.   Respiratory: Negative.    Cardiovascular: Negative.   Gastrointestinal: Negative.   Genitourinary: Negative.   Musculoskeletal: Negative.   Skin: Negative.   Neurological: Negative.   Endo/Heme/Allergies: Negative.   Psychiatric/Behavioral:  Positive for depression and suicidal ideas. The patient is nervous/anxious.    Blood pressure 122/79, pulse 90, temperature 98.4 F (36.9 C), temperature source Oral, resp. rate 16, height 5\' 2"  (1.575 m), weight 72.1 kg, last menstrual period 05/17/2014, SpO2 99 %. Body mass index is 29.08 kg/m.   Treatment Plan Summary: Daily contact with patient to assess and evaluate symptoms and progress in treatment and Medication management  Observation Level/Precautions:  15 minute checks  Laboratory:  CBC Chemistry Profile HbAIC UDS UA  Psychotherapy: Therapeutic milieu  Medications: See MAR  Consultations: Not applicable  Discharge Concerns: Safety  Estimated LOS: 3 to 5 days  Other:      Physician Treatment Plan for Primary Diagnosis: MDD (major depressive disorder), recurrent episode, severe (HCC) Long Term Goal(s): Improvement in symptoms so as ready for discharge   Short Term Goals: Ability to identify changes in lifestyle to reduce recurrence of condition will improve, Ability to verbalize feelings will improve, Ability to disclose and discuss suicidal ideas, Ability to demonstrate self-control will improve, Ability to identify and develop effective coping behaviors will improve, Ability to maintain clinical measurements within normal limits will improve, Compliance with prescribed medications will improve, and Ability to identify triggers associated with substance abuse/mental health issues will improve   Physician Treatment Plan for Secondary Diagnosis: Principal Problem:   MDD (major depressive disorder), recurrent episode, severe (HCC)    Diagnoses/ Active problems: -Major depressive disorder, recurrent, severe, with psychotic features. -Generalized anxiety disorder -Cocaine use disorder -Alcohol use disorder   PLAN: Safety and Monitoring: continue inpatient psych admission; 15-minute checks; daily contact with patient to assess and evaluate symptoms and progress in treatment; psychoeducation.Vital signs: q12 hours. Precautions: suicide, elopement, and assault. Placed on room lock out for meals, snacks and groups.   Medications: -Major depressive disorder, recurrent, severe, with psychotic features  -Continue:   - Olanzapine 5mg  PO QHS for  depression and psychosis.   - Zoloft 50 mg p.o. daily for  Depression  -Generalized Anxiety Disorder  - Continue:      - Gabapentin 300mg  PO TID for  anxiety, seizure propx. -Cocaine use disorder  -Alcohol use disorder  -CIWA with MVI, thiamine and PRN Ativan for scores >10.   The risks/benefits/side-effects/alternatives to this medication were discussed in detail with the patient and time was given for questions. The patient consents to medications.   - Metabolic profile and EKG monitoring obtained while on an atypical antipsychotic.   - Encouraged patient to participate in unit milieu and in scheduled group therapies    PRN medications: - Acetaminophen 650 mg q6hrs PRN mild  pain - Alum & mag hydroxide-simeth 200-200-20  mg q4hrs PRN indigestion - HydrOXYzine 25 mg TID PRN anxiety - Loperamide 2 mg PRN diarrhea or loose  stools - Magnesium hydroxide suspension 30  mL daily PRN: mild constipation - Nicorette gum 2 mg as needed: smoking cessation - Trazodone 50 mg HS PRN: sleep   I certify that inpatient services furnished can reasonably be expected to improve the patient's condition.   Loletta ParishBrooke A Leevy-Johnson, NP 09/03/2022, 2:10 PMPatient ID: Otilio ConnorsIna L Rios, female   DOB: 1965/10/06, 56 y.o.   MRN: [id number]

## 2022-09-03 NOTE — Progress Notes (Signed)

## 2022-09-03 NOTE — [facility] Group Notes (Signed)
Adult Psychoeducational Group Note  Date:  09/03/2022 Time:  10:22 AM  Group Topic/Focus:  Goals Group:   The focus of this group is to help patients establish daily goals to achieve during treatment and discuss how the patient can incorporate goal setting into their daily lives to aide in recovery.  Participation Level:  Active  Participation Quality:  Appropriate  Affect:  Appropriate  Cognitive:  Appropriate  Insight: Appropriate  Engagement in Group:  Engaged  Modes of Intervention:  Education  Additional Comments:  PT"S goal is to take medication  Gwinda Maine 09/03/2022, 10:22 AM

## 2022-09-03 NOTE — [facility] Suicide Risk Assessment (Signed)
[facility] INPATIENT:  Family/Significant Other Suicide Prevention Education  Suicide Prevention Education:  Patient Refusal for Family/Significant Other Suicide Prevention Education: The patient Theresa Rios has refused to provide written consent for family/significant other to be provided Family/Significant Other Suicide Prevention Education during admission and/or prior to discharge.  Physician notified.  Metro Kung Eriberto Felch 09/03/2022, 10:11 AM

## 2022-09-03 NOTE — Group Note (Signed)
LCSW Group Therapy Note   Group Date: 09/03/2022 Start Time: 1100 End Time: 1200   Type of Therapy: Group Therapy: Boundaries  Participation Level: Did Not Attend  Description of Group: This group will address the use of boundaries in their personal lives. Patients will explore why boundaries are important, the difference between healthy and unhealthy boundaries, and negative and positive outcomes of different boundaries and will look at how boundaries can be crossed.  Patients will be encouraged to identify current boundaries in their own lives and identify what kind of boundary is being set. Facilitators will guide patients in utilizing problem-solving interventions to address and correct types boundaries being used and to address when no boundary is being used. Understanding and applying boundaries will be explored and addressed for obtaining and maintaining a balanced life. Patients will be encouraged to explore ways to assertively make their boundaries and needs known to significant others in their lives, using other group members and facilitator for role play, support, and feedback.   Therapeutic Goals: 1. Patient will identify areas in their life where setting clear boundaries could be used to improve their life.  2. Patient will identify signs/triggers that a boundary is not being respected. 3. Patient will identify two ways to set boundaries in order to achieve balance in their lives: 4. Patient will demonstrate ability to communicate their needs and set boundaries through discussion and/or role plays   Summary of Progress/Problems: Did not attend  Aram Beecham, LCSWA 09/03/2022  1:18 PM

## 2022-09-03 NOTE — [facility] Group Notes (Signed)
Adult Psychoeducational Group Note  Date:  09/03/2022 Time:  4:21 PM  Group Topic/Focus:  Healthy Communication:   The focus of this group is to discuss communication, barriers to communication, as well as healthy ways to communicate with others.  Participation Level:  Active  Participation Quality:  Appropriate  Affect:  Appropriate  Cognitive:  Appropriate  Insight: Appropriate  Engagement in Group:  Engaged  Modes of Intervention:  Education  Additional Comments:  PT participated in group " Be impeccable wit your words"  Gwinda Maine 09/03/2022, 4:21 PM

## 2022-09-03 NOTE — Group Note (Unsigned)
Date:  09/03/2022 Time:  2:10 PM  Group Topic/Focus:  Healthy Communication:   The focus of this group is to discuss communication, barriers to communication, as well as healthy ways to communicate with others.     Participation Level:  {[facility] PARTICIPATION LEVEL:22264}  Participation Quality:  {[facility] PARTICIPATION QUALITY:22265}  Affect:  {[facility] AFFECT:22266}  Cognitive:  {[facility] COGNITIVE:22267}  Insight: {B[facility] Insight2:20797}  Engagement in Group:  {[facility] ENGAGEMENT IN GROUP:22268}  Modes of Intervention:  {[facility] MODES OF INTERVENTION:22269}  Additional Comments:  ***  Revia Nghiem K Presley Gora 09/03/2022, 2:10 PM  

## 2022-09-03 NOTE — Progress Notes (Signed)
   09/03/22 2300  Psych Admission Type (Psych Patients Only)  Admission Status Voluntary  Psychosocial Assessment  Patient Complaints Anxiety;Depression  Eye Contact Brief  Facial Expression Sad  Affect Appropriate to circumstance  Speech Logical/coherent  Interaction Assertive  Motor Activity Slow  Appearance/Hygiene In hospital gown;Improved  Behavior Characteristics Appropriate to situation;Cooperative  Thought Process  Coherency WDL  Content WDL  Delusions None reported or observed  Perception WDL  Hallucination None reported or observed  Judgment WDL  Confusion None  Danger to Self  Current suicidal ideation? Denies  Self-Injurious Behavior No self-injurious ideation or behavior indicators observed or expressed   Agreement Not to Harm Self Yes  Description of Agreement Verbal  Danger to Others  Danger to Others None reported or observed

## 2022-09-03 NOTE — Group Note (Unsigned)
Date:  09/03/2022 Time:  10:11 AM  Group Topic/Focus:  Goals Group:   The focus of this group is to help patients establish daily goals to achieve during treatment and discuss how the patient can incorporate goal setting into their daily lives to aide in recovery.     Participation Level:  {[facility] PARTICIPATION LEVEL:22264}  Participation Quality:  {[facility] PARTICIPATION QUALITY:22265}  Affect:  {[facility] AFFECT:22266}  Cognitive:  {[facility] COGNITIVE:22267}  Insight: {B[facility] Insight2:20797}  Engagement in Group:  {[facility] ENGAGEMENT IN GROUP:22268}  Modes of Intervention:  {[facility] MODES OF INTERVENTION:22269}  Additional Comments:  ***  Shad Ledvina K Labria Wos 09/03/2022, 10:11 AM  

## 2022-09-03 NOTE — Group Note (Signed)
Recreation Therapy Group Note   Group Topic:Animal Assisted Therapy   Group Date: 09/03/2022 Start Time: 1430 End Time: 1505 Facilitators: Jihad Brownlow-McCall, LRT,CTRS Location: [address] Dayroom   Animal-Assisted Activity (AAA) Program Checklist/Progress Notes Patient Eligibility Criteria Checklist & Daily Group note for Rec Tx Intervention  AAA/T Program Assumption of Risk Form signed by Patient/ or Parent Legal Guardian Yes  Patient is free of allergies or severe asthma Yes  Patient reports no fear of animals No  Patient reports no history of cruelty to animals Yes  Patient understands his/her participation is voluntary Yes  Patient washes hands before animal contact Yes  Patient washes hands after animal contact Yes   Affect/Mood: Appropriate   Participation Level: None   Participation Quality: Independent   Behavior: Guarded    Clinical Observations/Individualized Feedback:  Pt attended group session but showed some fear of the dog team.  Pt told she could leave if she wanted to.  Pt chose to stay and sit beside LRT to feel comfortable in the dog's presence.  In addition, the handler made sure the dog didn't get close to patient.    Plan: Continue to engage patient in RT group sessions 2-3x/week.   Cj Edgell-McCall, LRT,CTRS 09/03/2022 3:59 PM

## 2022-09-04 DIAGNOSIS — F332 Major depressive disorder, recurrent severe without psychotic features: Secondary | ICD-10-CM | POA: Diagnosis not present

## 2022-09-04 NOTE — Group Note (Signed)
Date:  09/04/2022 Time:  10:19 AM  Group Topic/Focus:  Goals Group:   The focus of this group is to help patients establish daily goals to achieve during treatment and discuss how the patient can incorporate goal setting into their daily lives to aide in recovery. Orientation:   The focus of this group is to educate the patient on the purpose and policies of crisis stabilization and provide a format to answer questions about their admission.  The group details unit policies and expectations of patients while admitted.    Participation Level:  Active  Participation Quality:  Appropriate  Affect:  Appropriate  Cognitive:  Appropriate  Insight: Appropriate  Engagement in Group:  Engaged  Modes of Intervention:  Discussion  Additional Comments:  Pt wants to stay on medication  Akaylah Lalley 09/04/2022, 10:19 AM

## 2022-09-04 NOTE — Progress Notes (Signed)
   09/04/22 0800  Psych Admission Type (Psych Patients Only)  Admission Status Voluntary  Psychosocial Assessment  Patient Complaints Anxiety;Depression  Eye Contact Brief  Facial Expression Sad  Affect Appropriate to circumstance  Speech Logical/coherent  Interaction Assertive  Motor Activity Slow  Appearance/Hygiene Unremarkable  Behavior Characteristics Appropriate to situation  Mood Depressed  Thought Process  Coherency WDL  Content WDL  Delusions None reported or observed  Perception WDL  Hallucination None reported or observed  Judgment WDL  Confusion None  Danger to Self  Current suicidal ideation? Denies  Self-Injurious Behavior No self-injurious ideation or behavior indicators observed or expressed   Agreement Not to Harm Self Yes  Description of Agreement verbal  Danger to Others  Danger to Others None reported or observed

## 2022-09-04 NOTE — Group Note (Signed)
Recreation Therapy Group Note   Group Topic:Team Building  Group Date: 09/04/2022 Start Time: 0930 End Time: 0950 Facilitators: Derrill Bagnell-McCall, LRT,CTRS Location: [address] Dayroom   Goal Area(s) Addresses:  Patient will effectively work with peer towards shared goal.  Patient will identify skills used to make activity successful.  Patient will share challenges and verbalize solution-driven approaches used. Patient will identify how skills used during activity can be used to reach post d/c goals.   Group Description: [employer]. Patients were provided the following materials: 5 drinking straws, 5 rubber bands, 5 paper clips, 2 index cards and 2 drinking cups. Using the provided materials patients were asked to build a launching mechanism to launch a ping pong ball across the room, approximately 10 feet. Patients were divided into teams of 3-5. Instructions required all materials be incorporated into the device, functionality of items left to the peer group's discretion.   Affect/Mood: Appropriate   Participation Level: None   Participation Quality: Independent   Behavior: On-looking   Speech/Thought Process: None   Insight: None   Judgement: None   Modes of Intervention: STEM Activity   Patient Response to Interventions:  Attentive   Education Outcome:  Acknowledges education and In group clarification offered    Clinical Observations/Individualized Feedback: Pt did not participate in group activity.  Pt observed for entire group.      Plan: Continue to engage patient in RT group sessions 2-3x/week.   Lizvet Chunn-McCall, LRT,CTRS 09/04/2022 1:23 PM

## 2022-09-04 NOTE — Progress Notes (Signed)
   09/04/22 2200  Psych Admission Type (Psych Patients Only)  Admission Status Voluntary  Psychosocial Assessment  Patient Complaints Depression  Eye Contact Brief  Facial Expression Animated;Sad  Affect Appropriate to circumstance  Speech Logical/coherent  Interaction Assertive  Motor Activity Slow  Appearance/Hygiene Unremarkable  Behavior Characteristics Appropriate to situation  Mood Depressed  Thought Process  Coherency WDL  Content WDL  Delusions None reported or observed  Perception WDL  Hallucination None reported or observed  Judgment WDL  Confusion None  Danger to Self  Current suicidal ideation? Denies  Self-Injurious Behavior No self-injurious ideation or behavior indicators observed or expressed   Agreement Not to Harm Self Yes  Description of Agreement Verbal  Danger to Others  Danger to Others None reported or observed

## 2022-09-04 NOTE — Progress Notes (Addendum)
[facility] MD Progress Note  09/04/2022 2:33 PM Theresa Rios  MRN:  [id number]   HPI:  Theresa Rios is a 56 year old female with a past psychiatric history of major depressive disorder recurrent episode severe, suicide attempt by drug ingestion, cocaine use, alcohol use disorder, anxiety who presented to [facility] voluntarily 08/31/22 for suicidal ideations with plan to overdose on medications where she was admitted to inpatient for further stabilization and treatment. Per chart review patient was recently discharged from [facility] 08/09/22 where she reports not taking medication or  following up with outpatient providers after discharge.   24-hour chart review: BP earlier today slightly elevated at 145/89, but normalized when rechecked. She has been compliant with scheduled medications, and did has not required any PRN medications in the last 24 hrs. AS per nursing documentation, she slept for 7.5 hrs last night.   Today's assessment: Pt presents today with a depressed mood, and affect is congruent. She is tearful during entire assessment, reports suicidal ideations related to being homeless. Writer asked if suicidal ideations would resolve if housing becomes available, and pt stated: "Yes, it is the thoughts of me being out there with no where to go that makes me want to end it all." She reports that her plan is to go to the [employer] and get sleeping pills to overdose on them if she remains homeless. She states that she [facility] to [facility] to the [facility] after discharge. She has been educated that the [facility] is now open 24 hrs a day, and plan is for her to be discharged there on Friday this week. She asked to speak with her CSW, and CSW was notified of request. Chronic SI is only related to pt being homeless.  Pt's attention to personal hygiene and grooming is fair, eye contact is good, speech is clear & coherent. Thought contents are organized and logical, and pt currently denies HI/AVH or  paranoia. There is no evidence of delusional thoughts.  She denies any medication related side effects. We are continuing medications as listed below with no changes today. Repeat EKG ordered for Qtc trending due to last Qtc on 12/4 being 473.  Principal Problem: MDD (major depressive disorder), recurrent episode, severe (HCC)  Diagnosis: Principal Problem:   MDD (major depressive disorder), recurrent episode, severe (HCC)  Total Time spent with patient: 45 minutes  Past Psychiatric History: Previous psych diagnoses: MDD, GAD, alcohol use d/o, cocaine use d/o.Marland Kitchen Previous psychiatric hospitalizations: several, last to [facility] 08/02/22 to 12/01//2023. History of prior suicide attempts: "yes" Non-suicidal self-injurious behaviors: denies History of violence: denies Previous psych medication: Zyprexa, Gabapentin.   Past Medical History:  Past Medical History:  Diagnosis Date   Seizures (HCC)    History reviewed. No pertinent surgical history.  Family History: History reviewed. No pertinent family history.  Family Psychiatric  History:  Patient denies suicide in family members.   Social History:  Social History   Substance and Sexual Activity  Alcohol Use Yes   Comment: drinks Sundays- sometimes during the week     Social History   Substance and Sexual Activity  Drug Use Yes   Types: Cocaine, Marijuana    Social History   Socioeconomic History   Marital status: Media planner    Spouse name: Not on file   Number of children: 0   Years of education: Not on file   Highest education level: Not on file  Occupational History   Not on file  Tobacco  Use   Smoking status: Every Day    Packs/day: 0.50    Types: Cigarettes   Smokeless tobacco: Never  Vaping Use   Vaping Use: Never used  Substance and Sexual Activity   Alcohol use: Yes    Comment: drinks Sundays- sometimes during the week   Drug use: Yes    Types: Cocaine, Marijuana   Sexual activity: Not on file  Other  Topics Concern   Not on file  Social History Narrative   Pt lives in [place] with a friend. Her husband is living with his mother. Pt recently lost her job and her place to stay.   Social Determinants of Health   Financial Resource Strain: Not on file  Food Insecurity: Food Insecurity Present (08/02/2022)   Hunger Vital Sign    Worried About Running Out of Food in the Last Year: Sometimes true    Ran Out of Food in the Last Year: Sometimes true  Transportation Needs: Unmet Transportation Needs (08/02/2022)   PRAPARE - Administrator, Civil Service (Medical): Yes    Lack of Transportation (Non-Medical): Yes  Physical Activity: Not on file  Stress: Not on file  Social Connections: Not on file   Additional Social History:    Sleep: Good  Appetite:  Good  Current Medications: Current Facility-Administered Medications  Medication Dose Route Frequency Provider Last Rate Last Admin   acetaminophen (TYLENOL) tablet 650 mg  650 mg Oral Q6H PRN Leevy-Johnson, Brooke A, NP   650 mg at 09/02/22 0750   alum & mag hydroxide-simeth (MAALOX/MYLANTA) 200-200-20 MG/5ML suspension 30 mL  30 mL Oral Q4H PRN Leevy-Johnson, Brooke A, NP       haloperidol (HALDOL) tablet 5 mg  5 mg Oral Q6H PRN Ntuen, Jesusita Oka, FNP       And   benztropine (COGENTIN) tablet 1 mg  1 mg Oral Q6H PRN Ntuen, Jesusita Oka, FNP       folic acid (FOLVITE) tablet 1 mg  1 mg Oral Daily Ntuen, Jesusita Oka, FNP   1 mg at 09/04/22 0802   gabapentin (NEURONTIN) capsule 300 mg  300 mg Oral BID Cecilie Lowers, FNP   300 mg at 09/04/22 6962   hydrOXYzine (ATARAX) tablet 25 mg  25 mg Oral TID PRN Ntuen, Jesusita Oka, FNP       loperamide (IMODIUM) capsule 2 mg  2 mg Oral PRN Ntuen, Jesusita Oka, FNP       magnesium hydroxide (MILK OF MAGNESIA) suspension 30 mL  30 mL Oral Daily PRN Leevy-Johnson, Brooke A, NP       multivitamin with minerals tablet 1 tablet  1 tablet Oral Daily Ntuen, Tina C, FNP   1 tablet at 09/04/22 0802   nicotine (NICODERM  CQ - dosed in mg/24 hours) patch 14 mg  14 mg Transdermal Daily Attiah, Nadir, MD       nicotine polacrilex (NICORETTE) gum 2 mg  2 mg Oral PRN Abbott Pao, Nadir, MD   2 mg at 09/04/22 0803   OLANZapine zydis (ZYPREXA) disintegrating tablet 5 mg  5 mg Oral QHS Ntuen, Tina C, FNP   5 mg at 09/03/22 2031   ondansetron (ZOFRAN) tablet 4 mg  4 mg Oral Q12H Ntuen, Tina C, FNP   4 mg at 09/04/22 0802   sertraline (ZOLOFT) tablet 50 mg  50 mg Oral Daily Ntuen, Tina C, FNP   50 mg at 09/04/22 0802   thiamine (Vitamin B-1) tablet 100 mg  100 mg  Oral Daily Ntuen, Jesusita Okaina C, FNP   100 mg at 09/04/22 [id number]   Or   thiamine (VITAMIN B1) injection 100 mg  100 mg Intravenous Daily Ntuen, Jesusita Okaina C, FNP       traZODone (DESYREL) tablet 50 mg  50 mg Oral QHS PRN Ntuen, Jesusita Okaina C, FNP   50 mg at 09/01/22 2144    Lab Results:  No results found for this or any previous visit (from the past 48 hour(s)).   Blood Alcohol level:  Lab Results  Component Value Date   ETH 112 (H) 08/01/2022   ETH 32 (H) 07/30/2022    Metabolic Disorder Labs: Lab Results  Component Value Date   HGBA1C 5.6 08/03/2022   MPG 114 08/03/2022   MPG 116.89 03/22/2022   No results found for: "PROLACTIN" Lab Results  Component Value Date   CHOL 245 (H) 09/01/2022   TRIG 128 09/01/2022   HDL 89 09/01/2022   CHOLHDL 2.8 09/01/2022   VLDL 26 09/01/2022   LDLCALC 130 (H) 09/01/2022   LDLCALC 130 (H) 08/03/2022    Physical Findings: AIMS:  , ,  ,  ,    CIWA:    COWS:     Musculoskeletal: Strength & Muscle Tone: within normal limits Gait & Station: normal Patient leans: N/A  Psychiatric Specialty Exam:  Presentation  General Appearance:  Appropriate for Environment; Fairly Groomed  Eye Contact: Fair  Speech: Clear and Coherent  Speech Volume: Normal  Handedness: Right  Mood and Affect  Mood: Anxious; Depressed  Affect: Congruent  Thought Process  Thought Processes: Coherent  Descriptions of  Associations:Intact  Orientation:Full (Time, Place and Person)  Thought Content:Logical  History of Schizophrenia/Schizoaffective disorder:No  Duration of Psychotic Symptoms:N/A  Hallucinations:Hallucinations: None  Ideas of Reference:None  Suicidal Thoughts:Suicidal Thoughts: No  Homicidal Thoughts:Homicidal Thoughts: No  Sensorium  Memory: Immediate Good  Judgment: Fair  Insight: Fair  Chartered certified accountantxecutive Functions  Concentration: Fair  Attention Span: Fair  Recall: Fair  Fund of Knowledge: Fair  Language: Good  Psychomotor Activity  Psychomotor Activity: Psychomotor Activity: Normal  Assets  Assets: Communication Skills; Resilience  Sleep  Sleep: Sleep: Good  Physical Exam: Physical Exam Vitals and nursing note reviewed.  Constitutional:      Appearance: Normal appearance.  HENT:     Head: Normocephalic.     Nose: Nose normal.     Mouth/Throat:     Mouth: Mucous membranes are moist.     Pharynx: Oropharynx is clear.  Eyes:     Conjunctiva/sclera: Conjunctivae normal.     Pupils: Pupils are equal, round, and reactive to light.  Cardiovascular:     Rate and Rhythm: Normal rate.     Pulses: Normal pulses.  Pulmonary:     Effort: Pulmonary effort is normal.  Abdominal:     Palpations: Abdomen is soft.  Genitourinary:    Comments: Deferred Musculoskeletal:        General: Normal range of motion.     Cervical back: Normal range of motion.  Skin:    General: Skin is warm.  Neurological:     General: No focal deficit present.     Mental Status: She is alert and oriented to person, place, and time.  Psychiatric:        Mood and Affect: Mood normal.        Behavior: Behavior normal.        Thought Content: Thought content is not paranoid or delusional. Thought content includes suicidal ideation. Thought content does  not include homicidal ideation. Thought content does not include homicidal or suicidal plan.    Review of Systems   Constitutional: Negative.   HENT: Negative.    Eyes: Negative.   Respiratory: Negative.    Cardiovascular: Negative.   Gastrointestinal: Negative.   Genitourinary: Negative.   Musculoskeletal: Negative.   Skin: Negative.   Neurological: Negative.   Endo/Heme/Allergies: Negative.   Psychiatric/Behavioral:  Positive for depression, substance abuse and suicidal ideas. Negative for hallucinations and memory loss. The patient is nervous/anxious. The patient does not have insomnia.    Blood pressure 123/81, pulse 100, temperature 98.4 F (36.9 C), temperature source Oral, resp. rate 16, height 5\' 2"  (1.575 m), weight 72.1 kg, last menstrual period 05/17/2014, SpO2 100 %. Body mass index is 29.08 kg/m.   Treatment Plan Summary: Daily contact with patient to assess and evaluate symptoms and progress in treatment and Medication management  Observation Level/Precautions:  15 minute checks  Laboratory:  CBC Chemistry Profile HbAIC UDS UA  Psychotherapy: Therapeutic milieu  Medications: See MAR  Consultations: Not applicable  Discharge Concerns: Safety  Estimated LOS: 3 to 5 days  Other:      Physician Treatment Plan for Primary Diagnosis: MDD (major depressive disorder), recurrent episode, severe (HCC) Long Term Goal(s): Improvement in symptoms so as ready for discharge   Short Term Goals: Ability to identify changes in lifestyle to reduce recurrence of condition will improve, Ability to verbalize feelings will improve, Ability to disclose and discuss suicidal ideas, Ability to demonstrate self-control will improve, Ability to identify and develop effective coping behaviors will improve, Ability to maintain clinical measurements within normal limits will improve, Compliance with prescribed medications will improve, and Ability to identify triggers associated with substance abuse/mental health issues will improve   Physician Treatment Plan for Secondary Diagnosis: Principal Problem:    MDD (major depressive disorder), recurrent episode, severe (HCC)   Diagnoses/ Active problems: -Major depressive disorder, recurrent, severe, with psychotic features. -Generalized anxiety disorder -Cocaine use disorder -Alcohol use disorder   PLAN: Safety and Monitoring: continue inpatient psych admission; 15-minute checks; daily contact with patient to assess and evaluate symptoms and progress in treatment; psychoeducation.Vital signs: q12 hours. Precautions: suicide, elopement, and assault. Placed on room lock out for meals, snacks and groups.   Medications: -Major depressive disorder, recurrent, severe, with psychotic features  -Continue:   - Olanzapine 5mg  PO QHS for  depression and psychosis.   - Zoloft 50 mg p.o. daily for  Depression  -Generalized Anxiety Disorder  - Continue:      - Gabapentin 300mg  PO BID for  anxiety, seizure propx. -Cocaine use disorder  -Alcohol use disorder  -CIWA with MVI, thiamine and PRN Ativan for scores >10.   The risks/benefits/side-effects/alternatives to this medication were discussed in detail with the patient and time was given for questions. The patient consents to medications.   - Metabolic profile and EKG monitoring obtained while on an atypical antipsychotic.   - Encouraged patient to participate in unit milieu and in scheduled group therapies    PRN medications: - Acetaminophen 650 mg q6hrs PRN mild  pain - Alum & mag hydroxide-simeth 200-200-20  mg q4hrs PRN indigestion - HydrOXYzine 25 mg TID PRN anxiety - Loperamide 2 mg PRN diarrhea or loose  stools - Magnesium hydroxide suspension 30  mL daily PRN: mild constipation - Nicorette gum 2 mg as needed: smoking cessation - Trazodone 50 mg HS PRN: sleep   I certify that inpatient services furnished can reasonably be expected to  improve the patient's condition.   Starleen Blue, NP 09/04/2022, 2:33 PMPatient ID: Otilio Connors, female   DOB: 1966-08-27, 56 y.o.   MRN:  [id number] Patient ID: Theresa Rios, female   DOB: 02-Nov-1965, 56 y.o.   MRN: [id number]

## 2022-09-04 NOTE — Group Note (Unsigned)
Date:  09/04/2022 Time:  2:47 PM  Group Topic/Focus:  Self Care:   The focus of this group is to help patients understand the importance of self-care in order to improve or restore emotional, physical, spiritual, interpersonal, and financial health.     Participation Level:  {BHH PARTICIPATION LEVEL:22264}  Participation Quality:  {BHH PARTICIPATION QUALITY:22265}  Affect:  {BHH AFFECT:22266}  Cognitive:  {BHH COGNITIVE:22267}  Insight: {BHH Insight2:20797}  Engagement in Group:  {BHH ENGAGEMENT IN GROUP:22268}  Modes of Intervention:  {BHH MODES OF INTERVENTION:22269}  Additional Comments:  ***  Theresa Rios 09/04/2022, 2:47 PM  

## 2022-09-04 NOTE — Group Note (Signed)
Date:  09/04/2022 Time:  3:10 PM  Group Topic/Focus:  Self Care:   The focus of this group is to help patients understand the importance of self-care in order to improve or restore emotional, physical, spiritual, interpersonal, and financial health.    Participation Level:  Active  Participation Quality:  Appropriate  Affect:  Appropriate  Cognitive:  Appropriate  Insight: Appropriate  Engagement in Group:  Engaged  Modes of Intervention:  Discussion  Additional Comments:  Pt worked on [employer]  Theresa Rios 09/04/2022, 3:10 PM

## 2022-09-04 NOTE — Progress Notes (Signed)
   09/04/22 0655  15 Minute Checks  Location Cafeteria  Visual Appearance Calm  Behavior Composed  Sleep (Behavioral Health Patients Only)  Calculate sleep? (Click Yes once per 24 hr at 0600 safety check) Yes  Documented sleep last 24 hours 7.5

## 2022-09-04 NOTE — Group Note (Unsigned)
Date:  09/04/2022 Time:  10:06 AM  Group Topic/Focus:  Goals Group:   The focus of this group is to help patients establish daily goals to achieve during treatment and discuss how the patient can incorporate goal setting into their daily lives to aide in recovery. Orientation:   The focus of this group is to educate the patient on the purpose and policies of crisis stabilization and provide a format to answer questions about their admission.  The group details unit policies and expectations of patients while admitted.     Participation Level:  {BHH PARTICIPATION LEVEL:22264}  Participation Quality:  {BHH PARTICIPATION QUALITY:22265}  Affect:  {BHH AFFECT:22266}  Cognitive:  {BHH COGNITIVE:22267}  Insight: {BHH Insight2:20797}  Engagement in Group:  {BHH ENGAGEMENT IN GROUP:22268}  Modes of Intervention:  {BHH MODES OF INTERVENTION:22269}  Additional Comments:  ***  Briley Bumgarner Lashawn Hassani Sliney 09/04/2022, 10:06 AM  

## 2022-09-04 NOTE — [facility] Counselor (Signed)
CSW provided the Pt with a packet that contains information including shelter and housing resources, free and reduced price food information, clothing resources, crisis center information, a [employer] card, and suicide prevention information.   

## 2022-09-04 NOTE — Plan of Care (Signed)
?  Problem: Activity: ?Goal: Interest or engagement in activities will improve ?Outcome: Progressing ?Goal: Sleeping patterns will improve ?Outcome: Progressing ?  ?Problem: Coping: ?Goal: Ability to verbalize frustrations and anger appropriately will improve ?Outcome: Progressing ?Goal: Ability to demonstrate self-control will improve ?Outcome: Progressing ?  ?Problem: Safety: ?Goal: Periods of time without injury will increase ?Outcome: Progressing ?  ?

## 2022-09-05 DIAGNOSIS — F332 Major depressive disorder, recurrent severe without psychotic features: Secondary | ICD-10-CM | POA: Diagnosis not present

## 2022-09-05 NOTE — Plan of Care (Signed)
Patient cooperative and med compliant. Patient denies SI,HI, and A/V/H with no plan/intent. Patient states her depression and anxiety a 8 out of 10. Patient stated she was worried today because she is being discharged soon but has no where to go and feels like she needs more time. Patient encouraged to call places in order to see if there any available beds for her. Patient observed making calls this afternoon.    Problem: Coping: Goal: Ability to verbalize frustrations and anger appropriately will improve Outcome: Progressing   Problem: Health Behavior/Discharge Planning: Goal: Compliance with treatment plan for underlying cause of condition will improve Outcome: Progressing   Problem: Safety: Goal: Periods of time without injury will increase Outcome: Progressing

## 2022-09-05 NOTE — Progress Notes (Signed)
   09/05/22 2200  Psych Admission Type (Psych Patients Only)  Admission Status Voluntary  Psychosocial Assessment  Patient Complaints Depression  Eye Contact Fair  Facial Expression Anxious  Affect Depressed  Speech Logical/coherent  Interaction Assertive  Motor Activity Slow  Appearance/Hygiene Unremarkable  Behavior Characteristics Cooperative  Mood Depressed  Thought Process  Coherency WDL  Content WDL  Delusions None reported or observed  Perception WDL  Hallucination None reported or observed  Judgment Limited  Confusion None  Danger to Self  Current suicidal ideation? Denies  Self-Injurious Behavior No self-injurious ideation or behavior indicators observed or expressed   Agreement Not to Harm Self Yes  Description of Agreement Verbal  Danger to Others  Danger to Others None reported or observed

## 2022-09-05 NOTE — Progress Notes (Signed)
EKG placed in chart

## 2022-09-05 NOTE — Group Note (Signed)
LCSW Group Therapy Note   Group Date: 09/05/2022 Start Time: 1100 End Time: 1200  Type of Therapy and Topic:  Group Therapy:  Healthy and Unhealthy Supports  Participation Level:  Did Not Attend   Description of Group:  Patients in this group were introduced to the idea of adding a variety of healthy supports to address the various needs in their lives, especially in reference to their plans and focus for the new year.  Patients discussed what additional healthy supports could be helpful in their recovery and wellness after discharge in order to prevent future hospitalizations.   An emphasis was placed on using counselor, doctor, therapy groups, 12-step groups, and problem-specific support groups to expand supports.    Therapeutic Goals:   1)  discuss importance of adding supports to stay well once out of the hospital  2)  compare healthy versus unhealthy supports and identify some examples of each  3)  generate ideas and descriptions of healthy supports that can be added  4)  offer mutual support about how to address unhealthy supports  5)  encourage active participation in and adherence to discharge plan    Summary of Patient Progress:  The pt attended group but came in late.  The pt accepted all worksheets and materials provided to them.  The pt was able to identify various supports in their life that they can go to for assistance.  The pt was appropriate with their peers and when sharing their thoughts and feelings with the group.    Therapeutic Modalities:   Motivational Interviewing Brief Solution-Focused Therapy  Aram Beecham, [place] 09/05/2022  1:16 PM

## 2022-09-05 NOTE — Progress Notes (Signed)
   09/05/22 0616  15 Minute Checks  Location Dayroom  Visual Appearance Calm  Behavior Composed  Sleep (Behavioral Health Patients Only)  Calculate sleep? (Click Yes once per 24 hr at 0600 safety check) Yes  Documented sleep last 24 hours 6.5

## 2022-09-05 NOTE — Progress Notes (Signed)
[facility] MD Progress Note  09/05/2022 2:33 PM HOLLEY PRUETT  MRN:  [id number]   HPI:  Theresa Rios is a 56 year old female with a past psychiatric history of major depressive disorder recurrent episode severe, suicide attempt by drug ingestion, cocaine use, alcohol use disorder, anxiety who presented to [facility] voluntarily 08/31/22 for suicidal ideations with plan to overdose on medications where she was admitted to inpatient for further stabilization and treatment. Per chart review patient was recently discharged from [facility] 08/09/22 where she reports not taking medication or  following up with outpatient providers after discharge.   24-hour chart review: Vital signs reviewed and remain within normal limit, pulse remains slightly elevated but improved 108.  PRN Nicorette gum given for smoking cessation x1 and Acetaminophen 650 mg given for mild pain. Patient noted to be compliant with all scheduled medication except for refusing nicotine patch. Per chart review, patient noted to be intermittently visible in the milieu participating in group activities.   Today's assessment: Patient observed in bedroom laying down. She  presents alert and oriented. She reports feeling 'fine'. Mood euthymic. Affect congruent. Thought content logical and linear. She reports improvement in her mood;rates anxiety and depression 0/10. She denies any suicidal or homicidal ideations, auditory or visual hallucinations. She appears calm and cooperative; does not appear to be responding to internal or external stimuli.  No paranoia or delusional ideations noted. Personal hygiene and grooming appropriate. Patient continues on treatment regimen as listed below without any recent changes or noted side effects. Reiterates on risks / benefits /side-effects /alternatives to prescribed medication with the patient and time was given for questions. Provider discussed discharge plan for tomorrow in which patient responded 'okay, sounds good'.    Principal Problem: MDD (major depressive disorder), recurrent episode, severe (HCC)  Diagnosis: Principal Problem:   MDD (major depressive disorder), recurrent episode, severe (HCC)  Total Time spent with patient: 45 minutes  Past Psychiatric History: Previous psych diagnoses: MDD, GAD, alcohol use d/o, cocaine use d/o.Marland Kitchen Previous psychiatric hospitalizations: several, last to [facility] 08/02/22 to 12/01//2023. History of prior suicide attempts: "yes" Non-suicidal self-injurious behaviors: denies History of violence: denies Previous psych medication: Zyprexa, Gabapentin.   Past Medical History:  Past Medical History:  Diagnosis Date   Seizures (HCC)    History reviewed. No pertinent surgical history.  Family History: History reviewed. No pertinent family history.  Family Psychiatric  History:  Patient denies suicide in family members.   Social History:  Social History   Substance and Sexual Activity  Alcohol Use Yes   Comment: drinks Sundays- sometimes during the week     Social History   Substance and Sexual Activity  Drug Use Yes   Types: Cocaine, Marijuana    Social History   Socioeconomic History   Marital status: Media planner    Spouse name: Not on file   Number of children: 0   Years of education: Not on file   Highest education level: Not on file  Occupational History   Not on file  Tobacco Use   Smoking status: Every Day    Packs/day: 0.50    Types: Cigarettes   Smokeless tobacco: Never  Vaping Use   Vaping Use: Never used  Substance and Sexual Activity   Alcohol use: Yes    Comment: drinks Sundays- sometimes during the week   Drug use: Yes    Types: Cocaine, Marijuana   Sexual activity: Not on file  Other Topics Concern   Not on file  Social History Narrative   Pt lives in [place] with a friend. Her husband is living with his mother. Pt recently lost her job and her place to stay.   Social Determinants of Health   Financial Resource  Strain: Not on file  Food Insecurity: Food Insecurity Present (08/02/2022)   Hunger Vital Sign    Worried About Running Out of Food in the Last Year: Sometimes true    Ran Out of Food in the Last Year: Sometimes true  Transportation Needs: Unmet Transportation Needs (08/02/2022)   PRAPARE - Hydrologist (Medical): Yes    Lack of Transportation (Non-Medical): Yes  Physical Activity: Not on file  Stress: Not on file  Social Connections: Not on file   Additional Social History:    Sleep: Good  Appetite:  Good  Current Medications: Current Facility-Administered Medications  Medication Dose Route Frequency Provider Last Rate Last Admin   acetaminophen (TYLENOL) tablet 650 mg  650 mg Oral Q6H PRN Leevy-Johnson, Jyla Hopf A, NP   650 mg at 09/02/22 0750   alum & mag hydroxide-simeth (MAALOX/MYLANTA) 200-200-20 MG/5ML suspension 30 mL  30 mL Oral Q4H PRN Leevy-Johnson, Houa Nie A, NP       haloperidol (HALDOL) tablet 5 mg  5 mg Oral Q6H PRN Ntuen, Kris Hartmann, FNP       And   benztropine (COGENTIN) tablet 1 mg  1 mg Oral Q6H PRN Ntuen, Kris Hartmann, FNP       folic acid (FOLVITE) tablet 1 mg  1 mg Oral Daily Ntuen, Kris Hartmann, FNP   1 mg at 09/05/22 [id number]   gabapentin (NEURONTIN) capsule 300 mg  300 mg Oral BID Laretta Bolster, FNP   300 mg at 09/05/22 [id number]   hydrOXYzine (ATARAX) tablet 25 mg  25 mg Oral TID PRN Ntuen, Kris Hartmann, FNP       loperamide (IMODIUM) capsule 2 mg  2 mg Oral PRN Ntuen, Kris Hartmann, FNP       magnesium hydroxide (MILK OF MAGNESIA) suspension 30 mL  30 mL Oral Daily PRN Leevy-Johnson, Ondine Gemme A, NP       multivitamin with minerals tablet 1 tablet  1 tablet Oral Daily Ntuen, Tina C, FNP   1 tablet at 09/05/22 0829   nicotine (NICODERM CQ - dosed in mg/24 hours) patch 14 mg  14 mg Transdermal Daily Attiah, Nadir, MD       nicotine polacrilex (NICORETTE) gum 2 mg  2 mg Oral PRN Winfred Leeds, Nadir, MD   2 mg at 09/05/22 0834   OLANZapine zydis (ZYPREXA) disintegrating tablet 5  mg  5 mg Oral QHS Ntuen, Tina C, FNP   5 mg at 09/04/22 2152   ondansetron (ZOFRAN) tablet 4 mg  4 mg Oral Q12H Ntuen, Tina C, FNP   4 mg at 09/05/22 [id number]   sertraline (ZOLOFT) tablet 50 mg  50 mg Oral Daily Ntuen, Tina C, FNP   50 mg at 09/05/22 [id number]   thiamine (Vitamin B-1) tablet 100 mg  100 mg Oral Daily Ntuen, Tina C, FNP   100 mg at 09/05/22 [id number]   Or   thiamine (VITAMIN B1) injection 100 mg  100 mg Intravenous Daily Ntuen, Kris Hartmann, FNP       traZODone (DESYREL) tablet 50 mg  50 mg Oral QHS PRN Ntuen, Kris Hartmann, FNP   50 mg at 09/04/22 2152    Lab Results:  No results found for this or any previous visit (from the past  48 hour(s)).   Blood Alcohol level:  Lab Results  Component Value Date   ETH 112 (H) 08/01/2022   ETH 32 (H) [id number]    Metabolic Disorder Labs: Lab Results  Component Value Date   HGBA1C 5.6 08/03/2022   MPG 114 08/03/2022   MPG 116.89 03/22/2022   No results found for: "PROLACTIN" Lab Results  Component Value Date   CHOL 245 (H) 09/01/2022   TRIG 128 09/01/2022   HDL 89 09/01/2022   CHOLHDL 2.8 09/01/2022   VLDL 26 09/01/2022   LDLCALC 130 (H) 09/01/2022   LDLCALC 130 (H) 08/03/2022    Physical Findings: AIMS:  , ,  ,  ,    CIWA:    COWS:     Musculoskeletal: Strength & Muscle Tone: within normal limits Gait & Station: normal Patient leans: N/A  Psychiatric Specialty Exam:  Presentation  General Appearance:  Appropriate for Environment; Casual  Eye Contact: Good  Speech: Clear and Coherent  Speech Volume: Normal  Handedness: Right  Mood and Affect  Mood: Euthymic  Affect: Congruent  Thought Process  Thought Processes: Coherent; Linear  Descriptions of Associations:Intact  Orientation:Full (Time, Place and Person)  Thought Content:Logical  History of Schizophrenia/Schizoaffective disorder:No  Duration of Psychotic Symptoms:N/A  Hallucinations:Hallucinations: None  Ideas of Reference:None  Suicidal  Thoughts:Suicidal Thoughts: No  Homicidal Thoughts:Homicidal Thoughts: No  Sensorium  Memory: Immediate Good; Recent Good  Judgment: Good  Insight: Good  Executive Functions  Concentration: Good  Attention Span: Good  Recall: Good  Fund of Knowledge: Good  Language: Good  Psychomotor Activity  Psychomotor Activity: Psychomotor Activity: Normal  Assets  Assets: Communication Skills; Desire for Improvement; Physical Health; Resilience  Sleep  Sleep: Sleep: Good  Physical Exam: Physical Exam Vitals and nursing note reviewed.  Constitutional:      Appearance: Normal appearance.  HENT:     Head: Normocephalic.     Nose: Nose normal.     Mouth/Throat:     Mouth: Mucous membranes are moist.     Pharynx: Oropharynx is clear.  Eyes:     Conjunctiva/sclera: Conjunctivae normal.     Pupils: Pupils are equal, round, and reactive to light.  Cardiovascular:     Rate and Rhythm: Normal rate.     Pulses: Normal pulses.  Pulmonary:     Effort: Pulmonary effort is normal.  Abdominal:     Palpations: Abdomen is soft.  Genitourinary:    Comments: Deferred Musculoskeletal:        General: Normal range of motion.     Cervical back: Normal range of motion.  Skin:    General: Skin is warm.  Neurological:     General: No focal deficit present.     Mental Status: She is alert and oriented to person, place, and time.  Psychiatric:        Attention and Perception: She does not perceive auditory or visual hallucinations.        Mood and Affect: Mood normal.        Speech: Speech normal.        Behavior: Behavior normal.        Thought Content: Thought content is not paranoid or delusional. Thought content does not include homicidal ideation. Thought content does not include homicidal or suicidal plan.        Cognition and Memory: Cognition and memory normal.        Judgment: Judgment normal.    Review of Systems  Constitutional: Negative.   HENT: Negative.  Eyes: Negative.   Respiratory: Negative.    Cardiovascular: Negative.   Gastrointestinal: Negative.   Genitourinary: Negative.   Musculoskeletal: Negative.   Skin: Negative.   Neurological: Negative.   Endo/Heme/Allergies: Negative.   All other systems reviewed and are negative.  Blood pressure 106/79, pulse (!) 103, temperature 98.2 F (36.8 C), temperature source Oral, resp. rate 16, height 5\' 2"  (1.575 m), weight 72.1 kg, last menstrual period 05/17/2014, SpO2 99 %. Body mass index is 29.08 kg/m.   Treatment Plan Summary: Daily contact with patient to assess and evaluate symptoms and progress in treatment and Medication management  Observation Level/Precautions:  15 minute checks  Laboratory:  CBC Chemistry Profile HbAIC UDS UA  Psychotherapy: Therapeutic milieu  Medications: See MAR  Consultations: Not applicable  Discharge Concerns: Safety  Estimated LOS: 3 to 5 days  Other:      Physician Treatment Plan for Primary Diagnosis: MDD (major depressive disorder), recurrent episode, severe (HCC) Long Term Goal(s): Improvement in symptoms so as ready for discharge   Short Term Goals: Ability to identify changes in lifestyle to reduce recurrence of condition will improve, Ability to verbalize feelings will improve, Ability to disclose and discuss suicidal ideas, Ability to demonstrate self-control will improve, Ability to identify and develop effective coping behaviors will improve, Ability to maintain clinical measurements within normal limits will improve, Compliance with prescribed medications will improve, and Ability to identify triggers associated with substance abuse/mental health issues will improve   Physician Treatment Plan for Secondary Diagnosis: Principal Problem:   MDD (major depressive disorder), recurrent episode, severe (HCC)   Diagnoses/ Active problems: -Major depressive disorder, recurrent, severe, with psychotic features. -Generalized anxiety  disorder -Cocaine use disorder -Alcohol use disorder   PLAN: Safety and Monitoring: continue inpatient psych admission; 15-minute checks; daily contact with patient to assess and evaluate symptoms and progress in treatment; psychoeducation.Vital signs: q12 hours. Precautions: suicide, elopement, and assault. Placed on room lock out for meals, snacks and groups.   Medications: -Major depressive disorder, recurrent, severe, with psychotic features  -Continue:   - Olanzapine 5mg  PO QHS for  depression and psychosis.   - Zoloft 50 mg p.o. daily for  Depression  -Generalized Anxiety Disorder  - Continue:      - Gabapentin 300mg  PO TID for  anxiety, seizure propx. -Cocaine use disorder  -Alcohol use disorder  -CIWA with MVI, thiamine and PRN Ativan for scores >10.   The risks/benefits/side-effects/alternatives to this medication were discussed in detail with the patient and time was given for questions. The patient consents to medications.   - Metabolic profile and EKG monitoring obtained while on an atypical antipsychotic.   - Encouraged patient to participate in unit milieu and in scheduled group therapies    PRN medications: - Acetaminophen 650 mg q6hrs PRN mild  pain - Alum & mag hydroxide-simeth 200-200-20  mg q4hrs PRN indigestion - HydrOXYzine 25 mg TID PRN anxiety - Loperamide 2 mg PRN diarrhea or loose  stools - Magnesium hydroxide suspension 30  mL daily PRN: mild constipation - Nicorette gum 2 mg as needed: smoking cessation - Trazodone 50 mg HS PRN: sleep   I certify that inpatient services furnished can reasonably be expected to improve the patient's condition.   07/17/2014, NP 09/05/2022, 2:33 PM Patient ID: , female   DOB: 1966-08-11, 56 y.o.   MRN: Otilio Connors

## 2022-09-06 ENCOUNTER — Ambulatory Visit (HOSPITAL_COMMUNITY)
Admission: EM | Admit: 2022-09-06 | Discharge: 2022-09-06 | Disposition: A | Discharge status: Home / Self Care | Payer: No Payment, Other | Attending: Psychiatry | Admitting: Psychiatry

## 2022-09-06 DIAGNOSIS — F332 Major depressive disorder, recurrent severe without psychotic features: Secondary | ICD-10-CM | POA: Diagnosis not present

## 2022-09-06 DIAGNOSIS — F329 Major depressive disorder, single episode, unspecified: Secondary | ICD-10-CM | POA: Insufficient documentation

## 2022-09-06 DIAGNOSIS — Z59819 Housing instability, housed unspecified: Secondary | ICD-10-CM | POA: Insufficient documentation

## 2022-09-06 DIAGNOSIS — F411 Generalized anxiety disorder: Secondary | ICD-10-CM | POA: Insufficient documentation

## 2022-09-06 MED ORDER — TRAZODONE HCL 50 MG PO TABS: 50.0000 mg | ORAL_TABLET | Freq: Every evening | ORAL | 0 refills | Status: DC | PRN

## 2022-09-06 MED ORDER — NICOTINE POLACRILEX 2 MG MT GUM: 2.0000 mg | CHEWING_GUM | OROMUCOSAL | 0 refills | Status: DC | PRN

## 2022-09-06 MED ORDER — SERTRALINE HCL 50 MG PO TABS: 50.0000 mg | ORAL_TABLET | Freq: Every day | ORAL | 0 refills | Status: DC

## 2022-09-06 MED ORDER — GABAPENTIN 300 MG PO CAPS: 300.0000 mg | ORAL_CAPSULE | Freq: Two times a day (BID) | ORAL | 0 refills | Status: DC

## 2022-09-06 MED ORDER — OLANZAPINE 5 MG PO TBDP: 5.0000 mg | ORAL_TABLET | Freq: Every day | ORAL | 0 refills | Status: DC

## 2022-09-06 NOTE — [facility] Suicide Risk Assessment (Signed)
Suicide Risk Assessment  Discharge Assessment    [facility] Discharge Suicide Risk Assessment   Principal Problem: MDD (major depressive disorder), recurrent episode, severe ([place]) Discharge Diagnoses: Principal Problem:   MDD (major depressive disorder), recurrent episode, severe ([place])  Reason For Admission: Theresa Rios is a 56 year old female with a past psychiatric history of major depressive disorder recurrent episode severe, suicide attempt by drug ingestion, cocaine use, alcohol use disorder, anxiety who presented to [facility] voluntarily 08/31/22 for suicidal ideations with plan to overdose on medications in the context of homelessness and other socioeconomic stressors. She was admitted for treatment and stabilization of her mood. Per chart review patient was recently discharged from [facility] 08/09/22 where she reports not taking medication or  following up with outpatient providers after discharge.                                                  HOSPITAL COURSE During the patient's hospitalization, patient had extensive initial psychiatric evaluation, and follow-up psychiatric evaluations every day.  Psychiatric diagnoses provided upon initial assessment were as listed above.  Home medications were restarted as below: -Restart olanzapine 5mg  PO QHS for depression and psychosis (Qtc on 12/28 is 430.  -Restart gabapentin 300mg  PO TID for anxiety, seizure propx. -Restart Zoloft 50 mg p.o. daily for depression -Trazodone 50 mg p.o. as needed at bedtime for sleep -Hydroxyzine 25 mg p.o. 3 times daily as needed for anxiety -Nicotine NicoDerm CQ-dose in milligrams per 24 hours 14 mg transdermal every 24 hours for smoking cessation.  During the hospitalization, no other adjustments were made to the patient's psychiatric medication regimen. Pt is being discharged on medications as listed above, with the exception of Hydroxyzine which pt did not require during this hospitalization. Patient's care was discussed  during the interdisciplinary team meeting every day during the hospitalization. The patient denies having side effects to prescribed psychiatric medication.  Gradually, patient started adjusting to milieu. The patient was evaluated each day by a clinical provider to ascertain response to treatment. Improvement was noted by the patient's report of decreasing symptoms, improved sleep and appetite, affect, medication tolerance, behavior, and participation in unit programming.  Patient was asked each day to complete a self inventory noting mood, mental status, pain, new symptoms, anxiety and concerns. Symptoms were reported as significantly decreased or resolved completely by discharge.   On day of discharge, the patient reports that their mood is stable. The patient denied having suicidal thoughts for more than 48 hours prior to discharge.  Patient denies having homicidal thoughts.  Patient denies having auditory hallucinations.  Patient denies any visual hallucinations or other symptoms of psychosis. The patient was motivated to continue taking medication with a goal of continued improvement in mental health.   The patient reports their target psychiatric symptoms of depression, anxiety and insomnia responded well to the psychiatric medications, and the patient reports overall benefit from this psychiatric hospitalization. Supportive psychotherapy was provided to the patient. The patient also participated in regular group therapy while hospitalized. Coping skills, problem solving as well as relaxation therapies were also part of the unit programming.  Labs were reviewed with the patient, and abnormal results were discussed with the patient.  The patient is able to verbalize their individual safety plan to this provider.  # It is recommended to the patient to continue psychiatric  medications as prescribed, after discharge from the hospital.    # It is recommended to the patient to follow up with your  outpatient psychiatric provider and PCP.  # It was discussed with the patient, the impact of alcohol, drugs, tobacco have been there overall psychiatric and medical wellbeing, and total abstinence from substance use was recommended the patient.ed.  # Prescriptions provided or sent directly to preferred pharmacy at discharge. Patient agreeable to plan. Given opportunity to ask questions. Appears to feel comfortable with discharge.    # In the event of worsening symptoms, the patient is instructed to call the crisis hotline, 911, 988 and or go to the nearest ED for appropriate evaluation and treatment of symptoms. To follow-up with primary care provider for other medical issues, concerns and or health care needs  # Patient was discharged to the [facility] per her request. Pt refused this option on admission (residential treatment, but now agreeing to go there). The [facility] was offered, but pt declined this option, even though she was educated that they now offer round the clock shelter. She is being discharged today with a plan to follow up as noted below.   Total Time spent with patient: 1.5 hours  Musculoskeletal: Strength & Muscle Tone: within normal limits Gait & Station: normal Patient leans: N/A  Psychiatric Specialty Exam  Presentation  General Appearance:  Appropriate for Environment; Fairly Groomed  Eye Contact: Good  Speech: Clear and Coherent  Speech Volume: Normal  Handedness: Right   Mood and Affect  Mood: Euthymic  Duration of Depression Symptoms: Greater than two weeks  Affect: Congruent  Thought Process  Thought Processes: Coherent  Descriptions of Associations:Intact  Orientation:Full (Time, Place and Person)  Thought Content:Logical  History of Schizophrenia/Schizoaffective disorder:No  Duration of Psychotic Symptoms:N/A  Hallucinations:Hallucinations: None  Ideas of Reference:None  Suicidal  Thoughts:Suicidal Thoughts: No  Homicidal Thoughts:Homicidal Thoughts: No  Sensorium  Memory: Immediate Good  Judgment: Good  Insight: Good  Executive Functions  Concentration: Good  Attention Span: Good  Recall: Good  Fund of Knowledge: Good  Language: Good   Psychomotor Activity  Psychomotor Activity: Psychomotor Activity: Normal   Assets  Assets: Communication Skills  Sleep  Sleep: Sleep: Good  Physical Exam: Physical Exam Constitutional:      Appearance: Normal appearance.  HENT:     Head: Normocephalic.     Nose: No congestion or rhinorrhea.  Eyes:     Pupils: Pupils are equal, round, and reactive to light.  Cardiovascular:     Rate and Rhythm: Normal rate.  Pulmonary:     Effort: Pulmonary effort is normal. No respiratory distress.  Musculoskeletal:        General: Normal range of motion.     Cervical back: Normal range of motion.  Neurological:     Mental Status: She is alert and oriented to person, place, and time.  Psychiatric:        Behavior: Behavior normal.    Review of Systems  Constitutional:  Negative for fever.  HENT: Negative.    Eyes: Negative.   Respiratory: Negative.  Negative for cough.   Cardiovascular: Negative.  Negative for chest pain.  Gastrointestinal: Negative.   Genitourinary: Negative.   Musculoskeletal: Negative.   Skin: Negative.   Neurological: Negative.   Psychiatric/Behavioral:  Positive for depression (Pt denies SI, denies HI, denies AVH, denies paranoia, verbally contracts for safety outside of this [facility]) and substance abuse (Educated on the negative impact  of ETOH use on her mental health). Negative for hallucinations, memory loss and suicidal ideas. The patient has insomnia (Resolved on Trazodone). The patient is not nervous/anxious.    Blood pressure 130/74, pulse 86, temperature 97.7 F (36.5 C), temperature source Oral, resp. rate 14, height 5\' 2"  (1.575 m), weight 72.1 kg, last menstrual  period 05/17/2014, SpO2 98 %. Body mass index is 29.08 kg/m.  Mental Status Per Nursing Assessment::   On Admission:  Suicidal ideation indicated by patient  Demographic Factors:  Low socioeconomic status  Loss Factors: Financial problems/change in socioeconomic status  Historical Factors: Prior suicide attempts  Risk Reduction Factors:   Positive coping skills or problem solving skills  Continued Clinical Symptoms:  Patient, reports that her depressive symptoms have significantly decreased. She denies SI, denies HI, denies AVH, verbally contracts for safety outside of this hospital. Pt is chronically suicidal due to not having stable housing, but denies SI at time of discharge.   Cognitive Features That Contribute To Risk:  None    Suicide Risk:  Mild:  There are no identifiable suicide plans, no associated intent, mild dysphoria and related symptoms, good self-control (both objective and subjective assessment), few other risk factors, and identifiable protective factors, including available and accessible social support.    Follow-up Information     [facility] Follow up on 09/11/2022.   Specialty: Behavioral Health Why: Please go to this provider for an assessment, to obtain therapy and/or medication management services, during walk in hours: Monday through Friday - *MUST ARRIVE NO LATER THAN 7:20 AM as services are first come, first served and end at 11:00 am. Contact information: [address] [place] [phone]               [phone], NP 09/06/2022, 10:26 AM

## 2022-09-06 NOTE — Discharge Instructions (Signed)
Dear Theresa Rios,  Most effective treatment for your mental health disease involves BOTH a psychiatrist AND a therapist Psychiatrist to manage medications Therapist to help identify personal goals, barriers from those goals, and plan to achieve those goals by understanding emotions Please make regular appointments with an outpatient psychiatrist and other doctors once you leave the hospital (if any, otherwise, please see below for resources to make an appointment).  For therapy outside the hospital, please ask for these specific types of therapy: DBT ________________________________________________________  SAFETY CRISIS  Dial 988 for National Suicide & Crisis Lifeline    Text [phone] for Crisis Text Line:     [facility] URGENT CARE:  [address] FIRST FLOOR.  [place], [place] [address].  [phone]  Mobile Crisis Response Teams Listed by counties in vicinity of [facility] providers [facility] [phone] [facility] [phone] [facility] [phone] [facility] [phone] [place]                * [facility] [phone]                * [facility] [phone] [facility] [phone] [facility]  [phone] * [facility] [phone] ________________________________________________________  To see which pharmacy near you is the CHEAPEST for certain medications, please use GoodRx. It is free website and has a free phone app.    Also consider looking at [facility] $4.00 or [facility]'s $7.00 prescription list. Both are free to view if googled "[facility] $4 prescription" and "public's $7 prescription". These are set prices, no insurance required. [facility] low cost medications: $4-$15 for 30days prescriptions  or $10-$38 for 90days prescriptions  ________________________________________________________  Difficulties with sleep?   Can also use this free app for insomnia called CBT-I. Let your doctors and therapists know so they can help with extra tips and tricks or for guidance and accountability. NO ADDS on the app.     ________________________________________________________  Non-Emergent / Urgent  [facility] [address], SECOND FLOOR [place], [place] [address] [phone] OUTPATIENT Walk-in information: Please note, all walk-ins are first come & first serve, with limited number of availability.  Please note that to be eligible for services you must bring: ID or a piece of mail with your name [facility] address  Therapist for therapy:  Monday & Wednesdays: Please ARRIVE at 7:15 AM for registration Will START at 8:00 AM Every 1st & 2nd Friday of the month: Please ARRIVE at 10:15 AM for registration Will START at 1 PM - 5 PM  Psychiatrist for medication management: Monday - Friday:  Please ARRIVE at 7:15 AM for registration Will START at 8:00 AM  Regretfully, due to limited availability, please be aware that you may not been seen on the same day as walk-in. Please consider making an appoint or try again. Thank you for your patience and understanding.

## 2022-09-06 NOTE — Discharge Summary (Signed)
Physician Discharge Summary Note  Patient:  Theresa Rios is an 56 y.o., female MRN:  [id number] DOB:  31-May-1966 Patient phone:  [phone] (home)  Patient address:   [place] [address],  Total Time spent with patient: 30 minutes  Date of Admission:  08/31/2022 Date of Discharge: 09/06/2022  Reason for Admission:  Theresa Rios is a 56 year old female with a past psychiatric history of major depressive disorder recurrent episode severe, suicide attempt by drug ingestion, cocaine use, alcohol use disorder, anxiety who presented to [facility] voluntarily 08/31/22 for suicidal ideations with plan to overdose on medications in the context of homelessness and other socioeconomic stressors. She was admitted for treatment and stabilization of her mood. Per chart review patient was recently discharged from [facility] 08/09/22 where she reports not taking medication or  following up with outpatient providers after discharge.     Principal Problem: MDD (major depressive disorder), recurrent episode, severe (HCC) Discharge Diagnoses: Principal Problem:   MDD (major depressive disorder), recurrent episode, severe (HCC)   Past Psychiatric History: See H & P   HOSPITAL COURSE During the patient's hospitalization, patient had extensive initial psychiatric evaluation, and follow-up psychiatric evaluations every day.  Psychiatric diagnoses provided upon initial assessment were as listed above.   Home medications were restarted as below: -Restart olanzapine 5mg  PO QHS for depression and psychosis (Qtc on 12/28 is 430.  -Restart gabapentin 300mg  PO TID for anxiety, seizure propx. -Restart Zoloft 50 mg p.o. daily for depression -Trazodone 50 mg p.o. as needed at bedtime for sleep -Hydroxyzine 25 mg p.o. 3 times daily as needed for anxiety -Nicotine NicoDerm CQ-dose in milligrams per 24 hours 14 mg transdermal every 24 hours for smoking cessation.   During the hospitalization, no other adjustments were  made to the patient's psychiatric medication regimen. Pt is being discharged on medications as listed above, with the exception of Hydroxyzine which pt did not require during this hospitalization. Patient's care was discussed during the interdisciplinary team meeting every day during the hospitalization. The patient denies having side effects to prescribed psychiatric medication.   Gradually, patient started adjusting to milieu. The patient was evaluated each day by a clinical provider to ascertain response to treatment. Improvement was noted by the patient's report of decreasing symptoms, improved sleep and appetite, affect, medication tolerance, behavior, and participation in unit programming.  Patient was asked each day to complete a self inventory noting mood, mental status, pain, new symptoms, anxiety and concerns. Symptoms were reported as significantly decreased or resolved completely by discharge.    On day of discharge, the patient reports that their mood is stable. The patient denied having suicidal thoughts for more than 48 hours prior to discharge.  Patient denies having homicidal thoughts.  Patient denies having auditory hallucinations.  Patient denies any visual hallucinations or other symptoms of psychosis. The patient was motivated to continue taking medication with a goal of continued improvement in mental health.    The patient reports their target psychiatric symptoms of depression, anxiety and insomnia responded well to the psychiatric medications, and the patient reports overall benefit from this psychiatric hospitalization. Supportive psychotherapy was provided to the patient. The patient also participated in regular group therapy while hospitalized. Coping skills, problem solving as well as relaxation therapies were also part of the unit programming.   Labs were reviewed with the patient, and abnormal results were discussed with the patient.   The patient is able to verbalize their  individual safety plan to this  provider.   # It is recommended to the patient to continue psychiatric medications as prescribed, after discharge from the hospital.     # It is recommended to the patient to follow up with your outpatient psychiatric provider and PCP.   # It was discussed with the patient, the impact of alcohol, drugs, tobacco have been there overall psychiatric and medical wellbeing, and total abstinence from substance use was recommended the patient.ed.   # Prescriptions provided or sent directly to preferred pharmacy at discharge. Patient agreeable to plan. Given opportunity to ask questions. Appears to feel comfortable with discharge.    # In the event of worsening symptoms, the patient is instructed to call the crisis hotline, 911, 988 and or go to the nearest ED for appropriate evaluation and treatment of symptoms. To follow-up with primary care provider for other medical issues, concerns and or health care needs   # Patient was discharged to the [facility] per her request. Pt refused this option on admission (residential treatment, but now agreeing to go there). [facility] was offered, but pt declined this option, even though she was educated that they now offer round the clock shelter. She is being discharged today with a plan to follow up as noted below.    Past Medical History:  Past Medical History:  Diagnosis Date   Seizures (HCC)    History reviewed. No pertinent surgical history. Family History: History reviewed. No pertinent family history. Family Psychiatric  History: See H & P Social History:  Social History   Substance and Sexual Activity  Alcohol Use Yes   Comment: drinks Sundays- sometimes during the week     Social History   Substance and Sexual Activity  Drug Use Yes   Types: Cocaine, Marijuana    Social History   Socioeconomic History   Marital status: Media planner    Spouse name: Not on file    Number of children: 0   Years of education: Not on file   Highest education level: Not on file  Occupational History   Not on file  Tobacco Use   Smoking status: Every Day    Packs/day: 0.50    Types: Cigarettes   Smokeless tobacco: Never  Vaping Use   Vaping Use: Never used  Substance and Sexual Activity   Alcohol use: Yes    Comment: drinks Sundays- sometimes during the week   Drug use: Yes    Types: Cocaine, Marijuana   Sexual activity: Not on file  Other Topics Concern   Not on file  Social History Narrative   Pt lives in [place] with a friend. Her husband is living with his mother. Pt recently lost her job and her place to stay.   Social Determinants of Health   Financial Resource Strain: Not on file  Food Insecurity: Food Insecurity Present (08/02/2022)   Hunger Vital Sign    Worried About Running Out of Food in the Last Year: Sometimes true    Ran Out of Food in the Last Year: Sometimes true  Transportation Needs: Unmet Transportation Needs (08/02/2022)   PRAPARE - Administrator, Civil Service (Medical): Yes    Lack of Transportation (Non-Medical): Yes  Physical Activity: Not on file  Stress: Not on file  Social Connections: Not on file  Physical Findings: AIMS: 0 CIWA: n/a COWS:  n/a  Musculoskeletal: Strength & Muscle Tone: within normal limits Gait & Station: normal Patient leans: N/A  Psychiatric Specialty Exam:  Presentation  General Appearance:  Appropriate for Environment; Fairly Groomed  Eye Contact: Good  Speech: Clear and Coherent  Speech Volume: Normal  Handedness: Right   Mood and Affect  Mood: Euthymic  Affect: Congruent   Thought Process  Thought Processes: Coherent  Descriptions of Associations:Intact  Orientation:Full (Time, Place and Person)  Thought Content:Logical  History of Schizophrenia/Schizoaffective disorder:No  Duration of Psychotic Symptoms:N/A  Hallucinations:Hallucinations:  None  Ideas of Reference:None  Suicidal Thoughts:Suicidal Thoughts: No  Homicidal Thoughts:Homicidal Thoughts: No   Sensorium  Memory: Immediate Good  Judgment: Good  Insight: Good   Executive Functions  Concentration: Good  Attention Span: Good  Recall: Good  Fund of Knowledge: Good  Language: Good  Psychomotor Activity  Psychomotor Activity: Psychomotor Activity: Normal  Assets  Assets: Communication Skills  Sleep  Sleep: Sleep: Good  Physical Exam: Physical Exam Constitutional:      Appearance: Normal appearance.  HENT:     Head: Normocephalic.     Nose: Nose normal. No congestion or rhinorrhea.  Eyes:     Pupils: Pupils are equal, round, and reactive to light.  Musculoskeletal:     Cervical back: Normal range of motion.  Neurological:     General: No focal deficit present.     Mental Status: She is alert and oriented to person, place, and time.  Psychiatric:        Thought Content: Thought content normal.    Review of Systems  Constitutional:  Negative for fever.  HENT: Negative.    Eyes: Negative.   Respiratory: Negative.    Cardiovascular: Negative.   Gastrointestinal: Negative.   Genitourinary: Negative.   Musculoskeletal: Negative.   Skin: Negative.   Neurological: Negative.   Psychiatric/Behavioral:  Positive for depression (Denies SI, denies HI. Denies AVH, verbally contracts for safety [facility] of [facility]) and substance abuse (Educated on the negative impact of substances of abuse on her mental health). Negative for hallucinations, memory loss and suicidal ideas. The patient is nervous/anxious (Resolved with current medications) and has insomnia (Resolved with current medications).    Blood pressure 130/74, pulse 86, temperature 97.7 F (36.5 C), temperature source Oral, resp. rate 14, height 5\' 2"  (1.575 m), weight 72.1 kg, last menstrual period 05/17/2014, SpO2 98 %. Body mass index is 29.08 kg/m.   Social History    Tobacco Use  Smoking Status Every Day   Packs/day: 0.50   Types: Cigarettes  Smokeless Tobacco Never   Tobacco Cessation:  A prescription for an FDA-approved tobacco cessation medication provided at discharge   Blood Alcohol level:  Lab Results  Component Value Date   ETH 112 (H) 08/01/2022   ETH 32 (H) 07/30/2022    Metabolic Disorder Labs:  Lab Results  Component Value Date   HGBA1C 5.6 08/03/2022   MPG 114 08/03/2022   MPG 116.89 03/22/2022   No results found for: "PROLACTIN" Lab Results  Component Value Date   CHOL 245 (H) 09/01/2022   TRIG 128 09/01/2022   HDL 89 09/01/2022   CHOLHDL 2.8 09/01/2022   VLDL 26 09/01/2022   LDLCALC 130 (H) 09/01/2022   LDLCALC 130 (H) 08/03/2022    See Psychiatric Specialty Exam and Suicide Risk Assessment completed by Attending Physician prior to discharge.  Discharge destination:  [facility]  Is patient on multiple antipsychotic therapies at discharge:  No   Has Patient had three or more failed trials of antipsychotic monotherapy by history:  No  Recommended Plan for Multiple Antipsychotic Therapies:  NA   Allergies as of 09/06/2022   No Known Allergies      Medication List     STOP taking these medications    cyclobenzaprine 10 MG tablet Commonly known as: FLEXERIL   gabapentin 600 MG tablet Commonly known as: NEURONTIN Replaced by: gabapentin 300 MG capsule       TAKE these medications      Indication  gabapentin 300 MG capsule Commonly known as: NEURONTIN Take 1 capsule (300 mg total) by mouth 2 (two) times daily. Replaces: gabapentin 600 MG tablet  Indication: Generalized Anxiety Disorder   nicotine polacrilex 2 MG gum Commonly known as: NICORETTE Take 1 each (2 mg total) by mouth as needed for smoking cessation.  Indication: Nicotine Addiction   OLANZapine zydis 5 MG disintegrating tablet Commonly known as: ZYPREXA Take 1 tablet (5 mg total) by mouth at bedtime.  Indication: Major  Depressive Disorder   sertraline 50 MG tablet Commonly known as: ZOLOFT Take 1 tablet (50 mg total) by mouth daily. Start taking on: September 07, 2022 What changed:  medication strength how much to take when to take this  Indication: Major Depressive Disorder   traZODone 50 MG tablet Commonly known as: DESYREL Take 1 tablet (50 mg total) by mouth at bedtime as needed for sleep. What changed:  medication strength how much to take when to take this reasons to take this  Indication: Trouble Sleeping        Follow-up Information     [facility] Follow up on 09/11/2022.   Specialty: Behavioral Health Why: Please go to this provider for an assessment, to obtain therapy and/or medication management services, during walk in hours: Monday through Friday - *MUST ARRIVE NO LATER THAN 7:20 AM as services are first come, first served and end at 11:00 am. Contact information: [phone] [place] [address] [phone]                Signed: Starleen Blue, NP 09/06/2022, 4:32 PM

## 2022-09-06 NOTE — BH Assessment (Signed)
Pt was given a bus pass and shelter resources. Pt also informed of the IRC.

## 2022-09-06 NOTE — Progress Notes (Signed)
Discharge Note:  Patient discharged home with family member.  Patient denied SI and HI. Denied A/V hallucinations. Suicide prevention information given and discussed with patient who stated they understood and had no questions. Patient stated they received all their belongings, clothing, toiletries, misc items, etc. Patient stated they appreciated all assistance received from [facility] staff. All required discharge information given to patient. 

## 2022-09-06 NOTE — Progress Notes (Signed)
   09/06/22 0600  15 Minute Checks  Location Bedroom  Visual Appearance Calm  Behavior Sleeping  Sleep (Behavioral Health Patients Only)  Calculate sleep? (Click Yes once per 24 hr at 0600 safety check) Yes  Documented sleep last 24 hours 9

## 2022-09-06 NOTE — Progress Notes (Signed)
  [facility] Adult Case Management Discharge Plan :  Will you be returning to the same living situation after discharge:  No. Shelter/IRC At discharge, do you have transportation home?: Yes,  Taxi  Do you have the ability to pay for your medications: Yes,  Community Support  Release of information consent forms completed and in the chart;  Patient's signature needed at discharge.  Patient to Follow up at:  Follow-up Information     [facility] Follow up on 09/11/2022.   Specialty: Behavioral Health Why: Please go to this provider for an assessment, to obtain therapy and/or medication management services, during walk in hours: Monday through Friday - *MUST ARRIVE NO LATER THAN 7:20 AM as services are first come, first served and end at 11:00 am. Contact information: [phone] [place] [address] [phone]                Next level of care provider has access to [facility] Link:yes  Safety Planning and Suicide Prevention discussed: Yes,  With patient      Has patient been referred to the Quitline?: Patient refused referral  Patient has been referred for addiction treatment: Pt. refused referral, at discharge patient asked to be discharged to [facility] but did not mention it during her stay at [facility].  A taxi will be provided to [facility] and a bus pass to go to the [facility] if not accepted.   Aram Beecham, LCSWA 09/06/2022, 10:16 AM

## 2022-09-06 NOTE — [facility] Counselor (Signed)
CSW spoke with the Pt about discharge.  Discharge is scheduled for today.  The Pt asked CSW about being referred to [facility].  CSW explained to the Pt that she has been denied from this service before and that if the Pt was seeking that type of referral it would have needed to be made prior to day of discharge.  The Pt stated that she still wanted to be sent to [facility].  CSW will provide the Pt with a taxi to [facility] and will also provide the Pt with a bus pass to get to the [facility].  The Pt agreed to this plan.

## 2022-09-06 NOTE — Progress Notes (Signed)
   09/06/22 1230  [facility] Triage Screening (Walk-ins at [facility] only)  How Did You Hear About [place]? Other (Comment) ([facility])  What Is the Reason for Your Visit/Call Today? Pt was dropped off by [facility] staff stating that pt was sent to [facility] today for services but they don't do intakes on Friday, however pt can return on Tuesday for an intake. Staff from [facility] reports that this is the safest place for pt to be and staff left. Pt states that the CSW told her she was going to [facility] for treatment and now she needs somewhere to stay until Tuesday. Pt denies SI, HI, AVH and has not used drugs or alcohol in the past month.  How Long Has This Been Causing You Problems? <Week  Have You Recently Had Any Thoughts About Hurting Yourself? No  How long ago did you have thoughts about hurting yourself? NA  Are You Planning to Commit Suicide/Harm Yourself At This time? No  Have you Recently Had Thoughts About Hurting Someone Karolee Ohs? No  Are You Planning To Harm Someone At This Time? No  Are you currently experiencing any auditory, visual or other hallucinations? No  Please explain the hallucinations you are currently experiencing: NA  Have You Used Any Alcohol or Drugs in the Past 24 Hours? No  How long ago did you use Drugs or Alcohol? NA  What Did You Use and How Much? NA  Do you have any current medical co-morbidities that require immediate attention? No  What Do You Feel Would Help You the Most Today? Treatment for Depression or other mood problem  If access to [facility] was not available, would you have sought care in the Emergency Department? No  Determination of Need Routine (7 days)  Options For Referral Other: Comment

## 2022-09-06 NOTE — ED Notes (Signed)
Pt discharged with resources in hand. Belongings retrieved by pt. Pt given bus pass. Safety maintained.

## 2022-09-06 NOTE — ED Provider Notes (Signed)
Behavioral Health Urgent Care Medical Screening Exam  Patient Name: Theresa Rios MRN: [id number] Date of Evaluation: 09/06/22 Chief Complaint: housing instability Diagnosis:  Final diagnoses:  Housing instability    History of Present illness:  Theresa Rios is a 56 y.o. female, with PMH MDD, GAD, AUD, cocaine use d/o, inpatient psych admission (08/31/2022-09/06/2022), suicide attempt, who presented voluntary to [facility] (09/06/2022) as a walk-in, after patient was discharged from [facility] earlier today to [facility], who then dropped her off at this facility due to [facility] not accepting patient's until Tuesday (09/10/2022). Patient was initially supposed to be d/c'd to the [facility] after [facility] d/c, however, patient was adamant to being d/c'd to [facility] at that time.     Patient denied SI/HI/AVH, paranoia, delusions.  Stated that she wants to go to [facility] for a 28-day program, however they dropped her off at [facility] due to them not accepting patients until Tuesday.  She reported that she needs it a place to stay until then.  Patient had been able to maintain cessation for >55-month.  Patient understood that she does not meet qualifications for [facility] or [facility] at this time, and that she was amenable to being discharged to [facility], which was the original plan at discharge from [facility].  Discussed that she is able to stay with her emergency contact, Reece Levy, however patient stated that she is not able to, as there is issues with the mother at that home.  She declined to give further explanation.  Patient continues to deny that there are anyone she could stay with.  Patient again denied SI/HI/AVH, paranoia, or any other safety concerns. Patient contracted to safety and was amenable to plan per below.   Review of Systems  Respiratory:  Negative for shortness of breath.   Cardiovascular:  Negative for chest pain.  Gastrointestinal:  Negative for nausea and vomiting.  Neurological:  Negative for  dizziness, tremors and headaches.   Exam BP 132/83 (BP Location: Left Arm)   Pulse 87   Temp 98.1 F (36.7 C) (Oral)   Resp 18   LMP 05/17/2014   SpO2 98%  Physical Exam Vitals and nursing note reviewed.  Constitutional:      General: She is awake. She is not in acute distress.    Appearance: She is not ill-appearing or diaphoretic.  HENT:     Head: Normocephalic.  Pulmonary:     Effort: Pulmonary effort is normal. No respiratory distress.  Neurological:     Mental Status: She is alert.     Psychiatric Specialty Exam: General Appearance: Appropriate for Environment; Fairly Groomed   Eye Contact: Good   Speech: Clear and Coherent   Volume: Normal    Mood: Euthymic  Affect: Congruent    Thought Process: Coherent  Descriptions of Associations: Intact  Duration of Psychotic Symptoms: N/A  Past Diagnosis of Schizophrenia or Psychoactive disorder: No   Orientation: Full (Time, Place and Person)   Thought Content: Logical  Hallucinations: None   Ideas of Reference: None   Suicidal Thoughts: No  Homicidal Thoughts: No   Memory: Immediate Good    Judgement: Good  Insight: Good    Psychomotor Activity: Normal    Concentration: Good  Attention Span: Good  Recall: Good    Fund of Knowledge: Good    Language: Good    Handed: Right    Assets: Communication Skills    Sleep: Good        Musculoskeletal: Strength & Muscle  Tone: within normal limits Gait & Station: steady Patient leans: N/A   [facility] MSE Discharge Disposition for Follow up and Recommendations: Based on my evaluation the patient does not appear to have an emergency medical condition and can be discharged with resources and follow up care in outpatient services for Medication Management and Substance Abuse Intensive Outpatient Program  Signed: Merrily Brittle, DO Psychiatry Resident, PGY-2 [facility]  09/06/2022, 1:12 PM

## 2022-09-27 ENCOUNTER — Observation Stay (HOSPITAL_COMMUNITY): Payer: Self-pay

## 2022-09-27 ENCOUNTER — Observation Stay (HOSPITAL_COMMUNITY)
Admission: EM | Admit: 2022-09-27 | Discharge: 2022-09-28 | Disposition: A | Discharge status: Home / Self Care | Payer: Self-pay | Attending: Internal Medicine | Admitting: Internal Medicine

## 2022-09-27 ENCOUNTER — Emergency Department (HOSPITAL_COMMUNITY): Payer: Self-pay

## 2022-09-27 ENCOUNTER — Encounter (HOSPITAL_COMMUNITY): Payer: Self-pay

## 2022-09-27 DIAGNOSIS — J9601 Acute respiratory failure with hypoxia: Secondary | ICD-10-CM

## 2022-09-27 DIAGNOSIS — J9691 Respiratory failure, unspecified with hypoxia: Secondary | ICD-10-CM

## 2022-09-27 DIAGNOSIS — F1090 Alcohol use, unspecified, uncomplicated: Secondary | ICD-10-CM | POA: Diagnosis present

## 2022-09-27 DIAGNOSIS — F17210 Nicotine dependence, cigarettes, uncomplicated: Secondary | ICD-10-CM | POA: Insufficient documentation

## 2022-09-27 DIAGNOSIS — R569 Unspecified convulsions: Secondary | ICD-10-CM

## 2022-09-27 DIAGNOSIS — F322 Major depressive disorder, single episode, severe without psychotic features: Principal | ICD-10-CM | POA: Diagnosis present

## 2022-09-27 DIAGNOSIS — Z79899 Other long term (current) drug therapy: Secondary | ICD-10-CM | POA: Insufficient documentation

## 2022-09-27 DIAGNOSIS — U071 COVID-19: Secondary | ICD-10-CM | POA: Insufficient documentation

## 2022-09-27 LAB — COMPREHENSIVE METABOLIC PANEL
ALT: 17 U/L (ref 0–44)
AST: 22 U/L (ref 15–41)
Albumin: 3.7 g/dL (ref 3.5–5.0)
Alkaline Phosphatase: 77 U/L (ref 38–126)
Anion gap: 11 (ref 5–15)
BUN: 8 mg/dL (ref 6–20)
CO2: 23 mmol/L (ref 22–32)
Calcium: 9 mg/dL (ref 8.9–10.3)
Chloride: 103 mmol/L (ref 98–111)
Creatinine, Ser: 0.64 mg/dL (ref 0.44–1.00)
GFR, Estimated: >60 mL/min (ref >60)
Glucose, Bld: 137 mg/dL — ABNORMAL HIGH (ref 70–99)
Potassium: 3.7 mmol/L (ref 3.5–5.1)
Sodium: 137 mmol/L (ref 135–145)
Total Bilirubin: 0.7 mg/dL (ref 0.3–1.2)
Total Protein: 7.1 g/dL (ref 6.5–8.1)

## 2022-09-27 LAB — CBG MONITORING, ED: Glucose-Capillary: 153 mg/dL — ABNORMAL HIGH (ref 70–99)

## 2022-09-27 LAB — CBC WITH DIFFERENTIAL/PLATELET
Abs Immature Granulocytes: 0.15 10*3/uL — ABNORMAL HIGH (ref 0.00–0.07)
Basophils Absolute: 0.1 10*3/uL (ref 0.0–0.1)
Basophils Relative: 0 %
Eosinophils Absolute: 0 10*3/uL (ref 0.0–0.5)
Eosinophils Relative: 0 %
HCT: 39.4 % (ref 36.0–46.0)
Hemoglobin: 12.6 g/dL (ref 12.0–15.0)
Immature Granulocytes: 1 %
Lymphocytes Relative: 8 %
Lymphs Abs: 1.5 10*3/uL (ref 0.7–4.0)
MCH: 28 pg (ref 26.0–34.0)
MCHC: 32 g/dL (ref 30.0–36.0)
MCV: 87.6 fL (ref 80.0–100.0)
Monocytes Absolute: 1.1 10*3/uL — ABNORMAL HIGH (ref 0.1–1.0)
Monocytes Relative: 6 %
Neutro Abs: 16.6 10*3/uL — ABNORMAL HIGH (ref 1.7–7.7)
Neutrophils Relative %: 85 %
Platelets: 176 10*3/uL (ref 150–400)
RBC: 4.5 MIL/uL (ref 3.87–5.11)
RDW: 15.1 % (ref 11.5–15.5)
WBC: 19.5 10*3/uL — ABNORMAL HIGH (ref 4.0–10.5)
nRBC: 0 % (ref 0.0–0.2)

## 2022-09-27 LAB — ETHANOL: Alcohol, Ethyl (B): <10 mg/dL (ref <10)

## 2022-09-27 LAB — RESP PANEL BY RT-PCR (RSV, FLU A&B, COVID)  RVPGX2
Influenza A by PCR: NEGATIVE
Influenza B by PCR: NEGATIVE
Resp Syncytial Virus by PCR: NEGATIVE
SARS Coronavirus 2 by RT PCR: POSITIVE — AB

## 2022-09-27 LAB — CK: Total CK: 113 U/L (ref 38–234)

## 2022-09-27 MED ORDER — SERTRALINE HCL 50 MG PO TABS
50.0000 mg | ORAL_TABLET | Freq: Every day | ORAL | Status: DC
  Administered 2022-09-27 – 2022-09-28 (×2): 50 mg via ORAL
  Filled 2022-09-27 (×2): qty 1

## 2022-09-27 MED ORDER — ACETAMINOPHEN 650 MG RE SUPP: 650.0000 mg | Freq: Four times a day (QID) | RECTAL | Status: DC | PRN

## 2022-09-27 MED ORDER — LORAZEPAM 1 MG PO TABS: 0.0000 mg | ORAL_TABLET | Freq: Four times a day (QID) | ORAL | Status: DC

## 2022-09-27 MED ORDER — THIAMINE HCL 100 MG/ML IJ SOLN
100.0000 mg | Freq: Every day | INTRAMUSCULAR | Status: DC
  Administered 2022-09-27: 100 mg via INTRAVENOUS
  Filled 2022-09-27: qty 2

## 2022-09-27 MED ORDER — VALPROATE SODIUM 100 MG/ML IV SOLN
250.0000 mg | Freq: Four times a day (QID) | INTRAVENOUS | Status: DC
  Administered 2022-09-28 (×2): 250 mg via INTRAVENOUS
  Filled 2022-09-27 (×10): qty 2.5

## 2022-09-27 MED ORDER — GABAPENTIN 300 MG PO CAPS
300.0000 mg | ORAL_CAPSULE | Freq: Two times a day (BID) | ORAL | Status: DC
  Administered 2022-09-27 – 2022-09-28 (×3): 300 mg via ORAL
  Filled 2022-09-27 (×3): qty 1

## 2022-09-27 MED ORDER — LORAZEPAM 2 MG/ML IJ SOLN: 0.0000 mg | Freq: Four times a day (QID) | INTRAMUSCULAR | Status: DC

## 2022-09-27 MED ORDER — SODIUM CHLORIDE 0.9 % IV BOLUS
1000.0000 mL | Freq: Once | INTRAVENOUS | Status: AC
  Administered 2022-09-27: 1000 mL via INTRAVENOUS

## 2022-09-27 MED ORDER — LEVETIRACETAM IN NACL 1500 MG/100ML IV SOLN
1500.0000 mg | Freq: Once | INTRAVENOUS | Status: AC
  Administered 2022-09-27: 1500 mg via INTRAVENOUS
  Filled 2022-09-27: qty 100

## 2022-09-27 MED ORDER — ACETAMINOPHEN 325 MG PO TABS: 650.0000 mg | ORAL_TABLET | Freq: Four times a day (QID) | ORAL | Status: DC | PRN

## 2022-09-27 MED ORDER — OLANZAPINE 5 MG PO TBDP
5.0000 mg | ORAL_TABLET | Freq: Every day | ORAL | Status: DC
  Administered 2022-09-28: 5 mg via ORAL
  Filled 2022-09-27 (×3): qty 1

## 2022-09-27 MED ORDER — ENOXAPARIN SODIUM 40 MG/0.4ML IJ SOSY
40.0000 mg | PREFILLED_SYRINGE | INTRAMUSCULAR | Status: DC
  Administered 2022-09-27 – 2022-09-28 (×2): 40 mg via SUBCUTANEOUS
  Filled 2022-09-27 (×2): qty 0.4

## 2022-09-27 MED ORDER — THIAMINE MONONITRATE 100 MG PO TABS
100.0000 mg | ORAL_TABLET | Freq: Every day | ORAL | Status: DC
  Administered 2022-09-28: 100 mg via ORAL
  Filled 2022-09-27: qty 1

## 2022-09-27 MED ORDER — GADOBUTROL 1 MMOL/ML IV SOLN
7.0000 mL | Freq: Once | INTRAVENOUS | Status: AC | PRN
  Administered 2022-09-27: 7 mL via INTRAVENOUS

## 2022-09-27 MED ORDER — DEXAMETHASONE SODIUM PHOSPHATE 10 MG/ML IJ SOLN: 40.0000 mg | Freq: Once | INTRAMUSCULAR | Status: DC

## 2022-09-27 MED ORDER — LEVETIRACETAM IN NACL 1000 MG/100ML IV SOLN: 1000.0000 mg | Freq: Once | INTRAVENOUS | Status: DC

## 2022-09-27 MED ORDER — TRAZODONE HCL 50 MG PO TABS: 50.0000 mg | ORAL_TABLET | Freq: Every evening | ORAL | Status: DC | PRN

## 2022-09-27 MED ORDER — SODIUM CHLORIDE 0.9 % IV SOLN
3.0000 g | Freq: Once | INTRAVENOUS | Status: AC
  Administered 2022-09-27: 3 g via INTRAVENOUS
  Filled 2022-09-27: qty 8

## 2022-09-27 MED ORDER — LORAZEPAM 2 MG/ML IJ SOLN
2.0000 mg | Freq: Once | INTRAMUSCULAR | Status: AC
  Administered 2022-09-27: 2 mg via INTRAVENOUS
  Filled 2022-09-27: qty 1

## 2022-09-27 MED ORDER — LORAZEPAM 2 MG/ML IJ SOLN: 0.0000 mg | Freq: Two times a day (BID) | INTRAMUSCULAR | Status: DC

## 2022-09-27 MED ORDER — DEXAMETHASONE SODIUM PHOSPHATE 10 MG/ML IJ SOLN
6.0000 mg | Freq: Once | INTRAMUSCULAR | Status: AC
  Administered 2022-09-27: 6 mg via INTRAVENOUS
  Filled 2022-09-27: qty 1

## 2022-09-27 MED ORDER — LORAZEPAM 1 MG PO TABS: 0.0000 mg | ORAL_TABLET | Freq: Two times a day (BID) | ORAL | Status: DC

## 2022-09-27 NOTE — ED Triage Notes (Signed)
[place] [facility] [place] on the bench and had a witnessed seizure that lasted abt 6 seconds. Hx of seizure. A&O X4 at this moment.

## 2022-09-27 NOTE — ED Notes (Signed)
Per attending physician, bladder scan if pt unable to void once in inpatient room

## 2022-09-27 NOTE — ED Notes (Signed)
Pt transported to MRI. Will go to 3W36 when MRI is complete

## 2022-09-27 NOTE — Procedures (Signed)
Patient Name: Theresa Rios  MRN: [id number]  Epilepsy Attending: Lora Havens  Referring Physician/Provider: Lennice Sites, DO  Date: 09/27/2022 Duration: 21.43 mins  Patient history: 57 yo female with a PMHx of seizures, MDD, cocaine use, alcohol use disorder who presents with seizures. EEG to evaluate for seizure  Level of alertness:  lethargic , asleep  AEDs during EEG study: GBP, LEV, Ativan  Technical aspects: This EEG study was done with scalp electrodes positioned according to the 10-20 International system of electrode placement. Electrical activity was reviewed with band pass filter of 1-70Hz , sensitivity of 7 uV/mm, display speed of 82mm/sec with a 60Hz  notched filter applied as appropriate. EEG data were recorded continuously and digitally stored.  Video monitoring was available and reviewed as appropriate.  Description: No posterior dominant rhythm was seen. Sleep was characterized by vertex waves, sleep spindles (12 to 14 Hz), maximal frontocentral region. There is an excessive amount of 15 to 18 Hz beta activity distributed symmetrically and diffusely.  Hyperventilation and photic stimulation were not performed.     ABNORMALITY - Excessive beta, generalized  IMPRESSION: This study is within normal limits. The excessive beta activity seen in the background is most likely due to the effect of benzodiazepine and is a benign EEG pattern. No seizures or epileptiform discharges were seen throughout the recording.  A normal interictal EEG does not exclude the diagnosis of epilepsy.  Marvion Bastidas Barbra Sarks

## 2022-09-27 NOTE — Consult Note (Signed)
NEUROLOGY CONSULTATION NOTE   Date of service: September 27, 2022 Patient Name: Theresa Rios MRN:  [id number] DOB:  07/26/1966 Reason for consult: "seizure" Requesting physician: Dr. Celso Sickle  History of Present Illness   Theresa Rios is a 57 y.o. female with PMH significant for seizures (alcohol withdrawal vs primary), MDD, cocaine use disorder, alcohol use disorder, suicide attempt who presents with a witnessed seizure at a bus stop.   Patient is somnolent on exam. She localizes to noxious stimuli and will occasionally shake her head but she does not open her eyes to voice. Per chart review, patient reported that she drank alcohol yesterday and she has a history of drinking 12 beers nightly. In the ED, patient had a 15-20 second episode where she had R sided gaze preference, became unresponsive, hypoxic, and tachycardic and tense in her upper arms with confusion and agitation afterwards. She was given IV ativan and Keppra. On second examination with attending, patient has increased alertness but slowed responses and is somnolent. She states that her last seizure was last week. She reports she was not taking her seizure medication. Discussed starting depakote with patient.   With regards to her seizure history, patient had head injury in 06/2021 and came to ED with seizures on 12/2021. She was recommended to follow-up with neurology. She then had seizures thought to be in the setting of alcohol withdrawal and was admitted 8/3-8/5. She was seen by neurology who recommended continuing keppra. She was started on librium taper and left prior to finishing. Her prior meds have included keppra 1000 and gabapentin 300 BID for her seizures, anxiety, and alcohol use disorder.   She has a history of alcohol use disorder and most recently was discharged from the [facility] to go to [facility] recovery center. It is unclear if she completed [facility] program. Alcohol use most recently (12/24) was 4-5 cans/beer per day.     ROS   Per HPI: all other systems reviewed and are negative  Past History   I have reviewed the following:  Past Medical History:  Diagnosis Date   Seizures (HCC)    History reviewed. No pertinent surgical history. History reviewed. No pertinent family history. Social History   Socioeconomic History   Marital status: Media planner    Spouse name: Not on file   Number of children: 0   Years of education: Not on file   Highest education level: Not on file  Occupational History   Not on file  Tobacco Use   Smoking status: Every Day    Packs/day: 0.50    Types: Cigarettes   Smokeless tobacco: Never  Vaping Use   Vaping Use: Never used  Substance and Sexual Activity   Alcohol use: Yes    Comment: drinks Sundays- sometimes during the week   Drug use: Yes    Types: Cocaine, Marijuana   Sexual activity: Not on file  Other Topics Concern   Not on file  Social History Narrative   Pt lives in [place] with a friend. Her husband is living with his mother. Pt recently lost her job and her place to stay.   Social Determinants of Health   Financial Resource Strain: Not on file  Food Insecurity: Food Insecurity Present (08/02/2022)   Hunger Vital Sign    Worried About Running Out of Food in the Last Year: Sometimes true    Ran Out of Food in the Last Year: Sometimes true  Transportation Needs: Unmet Transportation Needs (08/02/2022)   PRAPARE -  Hydrologist (Medical): Yes    Lack of Transportation (Non-Medical): Yes  Physical Activity: Not on file  Stress: Not on file  Social Connections: Not on file   No Known Allergies  Medications   (Not in a hospital admission)     Current Facility-Administered Medications:    acetaminophen (TYLENOL) tablet 650 mg, 650 mg, Oral, Q6H PRN **OR** acetaminophen (TYLENOL) suppository 650 mg, 650 mg, Rectal, Q6H PRN, Atway, Rayann N, DO   enoxaparin (LOVENOX) injection 40 mg, 40 mg, Subcutaneous,  Q24H, Atway, Rayann N, DO, 40 mg at 09/27/22 1625   gabapentin (NEURONTIN) capsule 300 mg, 300 mg, Oral, BID, Derek Jack, MD, 300 mg at 09/27/22 1624   LORazepam (ATIVAN) injection 0-4 mg, 0-4 mg, Intravenous, Q6H **OR** LORazepam (ATIVAN) tablet 0-4 mg, 0-4 mg, Oral, Q6H, Curatolo, Adam, DO   [START ON 09/29/2022] LORazepam (ATIVAN) injection 0-4 mg, 0-4 mg, Intravenous, Q12H **OR** [START ON 09/29/2022] LORazepam (ATIVAN) tablet 0-4 mg, 0-4 mg, Oral, Q12H, Curatolo, Adam, DO   thiamine (VITAMIN B1) tablet 100 mg, 100 mg, Oral, Daily **OR** thiamine (VITAMIN B1) injection 100 mg, 100 mg, Intravenous, Daily, Curatolo, Adam, DO, 100 mg at 09/27/22 1140   valproate (DEPACON) 250 mg in dextrose 5 % 50 mL IVPB, 250 mg, Intravenous, Q6H, Derek Jack, MD  Current Outpatient Medications:    gabapentin (NEURONTIN) 300 MG capsule, Take 1 capsule (300 mg total) by mouth 2 (two) times daily., Disp: 60 capsule, Rfl: 0   nicotine polacrilex (NICORETTE) 2 MG gum, Take 1 each (2 mg total) by mouth as needed for smoking cessation., Disp: 30 tablet, Rfl: 0   OLANZapine zydis (ZYPREXA) 5 MG disintegrating tablet, Take 1 tablet (5 mg total) by mouth at bedtime., Disp: 30 tablet, Rfl: 0   sertraline (ZOLOFT) 50 MG tablet, Take 1 tablet (50 mg total) by mouth daily., Disp: 30 tablet, Rfl: 0   traZODone (DESYREL) 50 MG tablet, Take 1 tablet (50 mg total) by mouth at bedtime as needed for sleep., Disp: 30 tablet, Rfl: 0  Vitals   Vitals:   09/27/22 1515 09/27/22 1530 09/27/22 1531 09/27/22 1547  BP: 123/72 133/75  133/75  Pulse: 100 97  97  Resp: 19 19    Temp:   98.4 F (36.9 C)   TempSrc:   Oral   SpO2: 96% 95%       There is no height or weight on file to calculate BMI.  Physical Exam   Physical Exam Gen: initially somnolent, on second exam, more alert but sleepy HEENT: Atraumatic, normocephalic Neck: Supple Resp: Normal WOB on RA CV: RRR, no m/g/r; nml S1 and S2. 2+ symmetric peripheral  pulses. Abd: soft/NT/ND Extrem: Nml bulk; no cyanosis, clubbing, or edema.  Neuro: *MS: Somnolent, recently received ativan *Speech: fluid, nondysarthric *CN:    I: Deferred   II,III: PERRLA   III,IV,VI: EOMI w/o nystagmus, no ptosis   V: Sensation unable to assess    VII: Eyelid closure was full.   VIII: Hearing intact to voice   IX,X: Voice normal    *Motor: Normal bulk.  No tremor, rigidity or bradykinesia. No pronator drift. 5/5 BLE and 5/5 BUE.   *Sensory: Intact to noxious stimuli.  *Coordination: Unable to assess *Reflexes:  2+ and symmetric throughout without clonus; toes down-going bilat *Gait: Deferred  Premorbid mRS = 0   Labs   CBC:  Recent Labs  Lab 09/27/22 1030  WBC 19.5*  NEUTROABS 16.6*  HGB 12.6  HCT 39.4  MCV 87.6  PLT 831    Basic Metabolic Panel:  Lab Results  Component Value Date   NA 137 09/27/2022   K 3.7 09/27/2022   CO2 23 09/27/2022   GLUCOSE 137 (H) 09/27/2022   BUN 8 09/27/2022   CREATININE 0.64 09/27/2022   CALCIUM 9.0 09/27/2022   GFRNONAA >60 09/27/2022   GFRAA >60 10/11/2019   Lipid Panel:  Lab Results  Component Value Date   LDLCALC 130 (H) 09/01/2022   HgbA1c:  Lab Results  Component Value Date   HGBA1C 5.6 08/03/2022   Urine Drug Screen:     Component Value Date/Time   LABOPIA NONE DETECTED 07/30/2022 1828   COCAINSCRNUR POSITIVE (A) 07/30/2022 1828   LABBENZ NONE DETECTED 07/30/2022 1828   AMPHETMU NONE DETECTED 07/30/2022 1828   THCU NONE DETECTED 07/30/2022 1828   LABBARB NONE DETECTED 07/30/2022 1828    Alcohol Level     Component Value Date/Time   ETH <10 09/27/2022 0936    CT Head without contrast: 1/19 IMPRESSION: 1. No acute intracranial abnormality. 2. 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  MRI Brain pending  rEEG: pending  Impression  Ms. Raineri is a 57 yo female with a PMHx of seizures, MDD, cocaine use, alcohol use disorder who presents with seizures. 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 She has had [facility] admissions for a prior suicide attempt by melatonin OD and for her substance use. Given her history of seizures that do not correlate with alcohol intake/cocaine (per collateral in 04/2022) and even in the setting of continued alcohol withdrawal, would recommend continuing an AED for her seizures. In the setting of MDD and prior suicide attempt, would not recommend continuing keppra given it can worsen her depression and will start her on depakote. With her history of seizures and alcohol use, would also recommend continuing her on ativan taper and restarting home gabapentin.    Recommendations   -Start depakote 250mg  Q6H IV and transition to 500 DR BID when able to tolerate PO intake - Restart home gabapentin  - Continue ativan taper, thiamine - routine EEG  - MRI brain wwout  ______________________________________________________________________  Rolanda Lundborg, MD, PGY-1   Attending Neurohospitalist Addendum Patient seen and examined with APP/Resident. Agree with the history and physical as documented above. Agree with the plan as documented, which I helped formulate. I have edited the note above to reflect my full findings and recommendations. I have independently reviewed the chart, obtained history, review of systems and examined the patient.I have personally reviewed pertinent head/neck/spine imaging (CT/MRI). Please feel free to call with any questions.  -- Su Monks, MD [facility] [phone]  If 7pm- 7am, please page neurology on call as listed in [place].

## 2022-09-27 NOTE — Hospital Course (Addendum)
Active Problems:   MDD (major depressive episode), single episode, severe, no psychosis ([place])   Alcohol use disorder   COVID-19  Principal Problem (Resolved):   Seizure-like activity ([place]) Resolved Problems:   Hypoxic respiratory failure ([place])  Consults: - neurology  Procedures: - eeg  Follow-up items: - Meds to [facility] on [address] for MATCH or TOC to bedside

## 2022-09-27 NOTE — ED Notes (Signed)
ED TO INPATIENT HANDOFF REPORT  ED Nurse Name and Phone #: Lynsie Mcwatters RN [phone]  S Name/Age/Gender Otilio Connors 57 y.o. female Room/Bed: 002C/002C  Code Status   Code Status: Full Code  Home/SNF/Other Home Patient oriented to: self, place, time, and situation Is this baseline? Yes   Triage Complete: Triage complete  Chief Complaint Seizure-like activity [facility]) [R56.9]  [place]riage Note [place] [facility] [place] bench and had a witnessed seizure that lasted abt 6 seconds. Hx of seizure. A&O X4 at this moment.    Allergies No Known Allergies  Level of Care/Admitting Diagnosis ED Disposition     ED Disposition  Admit   Condition  --   Comment  Hospital Area: [facility] [[id number]]  Level of Care: Telemetry Medical [104]  May place patient in observation at [facility] or [facility] if equivalent level of care is available:: No  Covid Evaluation: Asymptomatic - no recent exposure (last 10 days) testing not required  Diagnosis: Seizure-like activity [facility]) [[id number]]  Admitting Physician: Earl Lagos [[id number]]  Attending Physician: Earl Lagos [phone]          B Medical/Surgery History Past Medical History:  Diagnosis Date   Seizures (HCC)    History reviewed. No pertinent surgical history.   A IV Location/Drains/Wounds Patient Lines/Drains/Airways Status     Active Line/Drains/Airways     Name Placement date Placement time Site Days   Peripheral IV 09/27/22 20 G Anterior;Left Forearm 09/27/22  --  Forearm  less than 1   Peripheral IV 09/27/22 20 G Left;Posterior Forearm 09/27/22  --  Forearm  less than 1            Intake/Output Last 24 hours  Intake/Output Summary (Last 24 hours) at 09/27/2022 1751 Last data filed at 09/27/2022 1542 Gross per 24 hour  Intake 1000 ml  Output --  Net 1000 ml    Labs/Imaging Results for orders placed or performed during the hospital encounter of  09/27/22 (from the past 48 hour(s))  Comprehensive metabolic panel     Status: Abnormal   Collection Time: 09/27/22  9:36 AM  Result Value Ref Range   Sodium 137 135 - 145 mmol/L   Potassium 3.7 3.5 - 5.1 mmol/L   Chloride 103 98 - 111 mmol/L   CO2 23 22 - 32 mmol/L   Glucose, Bld 137 (H) 70 - 99 mg/dL    Comment: Glucose reference range applies only to samples taken after fasting for at least 8 hours.   BUN 8 6 - 20 mg/dL   Creatinine, Ser 4.49 0.44 - 1.00 mg/dL   Calcium 9.0 8.9 - 20.1 mg/dL   Total Protein 7.1 6.5 - 8.1 g/dL   Albumin 3.7 3.5 - 5.0 g/dL   AST 22 15 - 41 U/L   ALT 17 0 - 44 U/L   Alkaline Phosphatase 77 38 - 126 U/L   Total Bilirubin 0.7 0.3 - 1.2 mg/dL   GFR, Estimated >00 >71 mL/min    Comment: (NOTE) Calculated using the CKD-EPI Creatinine Equation (2021)    Anion gap 11 5 - 15    Comment: Performed at [facility], [address], [place], [place] [address]  Ethanol     Status: None   Collection Time: 09/27/22  9:36 AM  Result Value Ref Range   Alcohol, Ethyl (B) <10 <10 mg/dL    Comment: (NOTE) Lowest detectable limit for serum alcohol  is 10 mg/dL.  For medical purposes only. Performed at [facility], [address], [place], [place] [address]   CK     Status: None   Collection Time: 09/27/22  9:36 AM  Result Value Ref Range   Total CK 113 38 - 234 U/L    Comment: Performed at [facility], [address] [place], [place] [address]  POC CBG, ED     Status: Abnormal   Collection Time: 09/27/22  9:48 AM  Result Value Ref Range   Glucose-Capillary 153 (H) 70 - 99 mg/dL    Comment: Glucose reference range applies only to samples taken after fasting for at least 8 hours.  Resp panel by RT-PCR (RSV, Flu A&B, Covid) Anterior Nasal Swab     Status: Abnormal   Collection Time: 09/27/22 10:28 AM   Specimen: Anterior Nasal Swab  Result Value Ref Range   SARS Coronavirus 2 by RT PCR POSITIVE (A) NEGATIVE    Comment: (NOTE) SARS-CoV-2  target nucleic acids are DETECTED.  The SARS-CoV-2 RNA is generally detectable in upper respiratory specimens during the acute phase of infection. Positive results are indicative of the presence of the identified virus, but do not rule out bacterial infection or co-infection with other pathogens not detected by the test. Clinical correlation with patient history and other diagnostic information is necessary to determine patient infection status. The expected result is Negative.  Fact Sheet for Patients: BloggerCourse.com  Fact Sheet for Healthcare Providers: SeriousBroker.it  This test is not yet approved or cleared by the [place] FDA and  has been authorized for detection and/or diagnosis of SARS-CoV-2 by FDA under an Emergency Use Authorization (EUA).  This EUA will remain in effect (meaning this test can be used) for the duration of  the COVID-19 declaration under Section 564(b)(1) of the A ct, 21 U.S.C. section 360bbb-3(b)(1), unless the authorization is terminated or revoked sooner.     Influenza A by PCR NEGATIVE NEGATIVE   Influenza B by PCR NEGATIVE NEGATIVE    Comment: (NOTE) The Xpert Xpress SARS-CoV-2/FLU/RSV plus assay is intended as an aid in the diagnosis of influenza from Nasopharyngeal swab specimens and should not be used as a sole basis for treatment. Nasal washings and aspirates are unacceptable for Xpert Xpress SARS-CoV-2/FLU/RSV testing.  Fact Sheet for Patients: BloggerCourse.com  Fact Sheet for Healthcare Providers: SeriousBroker.it  This test is not yet approved or cleared by the [place] FDA and has been authorized for detection and/or diagnosis of SARS-CoV-2 by FDA under an Emergency Use Authorization (EUA). This EUA will remain in effect (meaning this test can be used) for the duration of the COVID-19 declaration under Section 564(b)(1) of  the Act, 21 U.S.C. section 360bbb-3(b)(1), unless the authorization is terminated or revoked.     Resp Syncytial Virus by PCR NEGATIVE NEGATIVE    Comment: (NOTE) Fact Sheet for Patients: BloggerCourse.com  Fact Sheet for Healthcare Providers: SeriousBroker.it  This test is not yet approved or cleared by the [place] FDA and has been authorized for detection and/or diagnosis of SARS-CoV-2 by FDA under an Emergency Use Authorization (EUA). This EUA will remain in effect (meaning this test can be used) for the duration of the COVID-19 declaration under Section 564(b)(1) of the Act, 21 U.S.C. section 360bbb-3(b)(1), unless the authorization is terminated or revoked.  Performed at [facility], [address] [place], [place] [address]   CBC with Differential/Platelet     Status: Abnormal  Collection Time: 09/27/22 10:30 AM  Result Value Ref Range   WBC 19.5 (H) 4.0 - 10.5 K/uL   RBC 4.50 3.87 - 5.11 MIL/uL   Hemoglobin 12.6 12.0 - 15.0 g/dL   HCT 39.4 36.0 - 46.0 %   MCV 87.6 80.0 - 100.0 fL   MCH 28.0 26.0 - 34.0 pg   MCHC 32.0 30.0 - 36.0 g/dL   RDW 15.1 11.5 - 15.5 %   Platelets 176 150 - 400 K/uL   nRBC 0.0 0.0 - 0.2 %   Neutrophils Relative % 85 %   Neutro Abs 16.6 (H) 1.7 - 7.7 K/uL   Lymphocytes Relative 8 %   Lymphs Abs 1.5 0.7 - 4.0 K/uL   Monocytes Relative 6 %   Monocytes Absolute 1.1 (H) 0.1 - 1.0 K/uL   Eosinophils Relative 0 %   Eosinophils Absolute 0.0 0.0 - 0.5 K/uL   Basophils Relative 0 %   Basophils Absolute 0.1 0.0 - 0.1 K/uL   Immature Granulocytes 1 %   Abs Immature Granulocytes 0.15 (H) 0.00 - 0.07 K/uL    Comment: Performed at [facility], [address] [place], [place] [address]   EEG adult  Result Date: 09/27/2022 Lora Havens, MD     09/27/2022  5:19 PM Patient Name: NURAH PETRIDES MRN: [id number] Epilepsy Attending: Lora Havens Referring Physician/Provider:  Lennice Sites, DO Date: 09/27/2022 Duration: 21.43 mins Patient history: 57 yo female with a PMHx of seizures, MDD, cocaine use, alcohol use disorder who presents with seizures. EEG to evaluate for seizure Level of alertness:  lethargic , asleep AEDs during EEG study: GBP, LEV, Ativan Technical aspects: This EEG study was done with scalp electrodes positioned according to the 10-20 International system of electrode placement. Electrical activity was reviewed with band pass filter of 1-70Hz , sensitivity of 7 uV/mm, display speed of 7mm/sec with a 60Hz  notched filter applied as appropriate. EEG data were recorded continuously and digitally stored.  Video monitoring was available and reviewed as appropriate. Description: No posterior dominant rhythm was seen. Sleep was characterized by vertex waves, sleep spindles (12 to 14 Hz), maximal frontocentral region. There is an excessive amount of 15 to 18 Hz beta activity distributed symmetrically and diffusely.  Hyperventilation and photic stimulation were not performed.   ABNORMALITY - Excessive beta, generalized IMPRESSION: This study is within normal limits. The excessive beta activity seen in the background is most likely due to the effect of benzodiazepine and is a benign EEG pattern. No seizures or epileptiform discharges were seen throughout the recording. A normal interictal EEG does not exclude the diagnosis of epilepsy. Lora Havens   CT Head Wo Contrast  Result Date: 09/27/2022 CLINICAL DATA:  Mental status change, unknown cause. EXAM: CT HEAD WITHOUT CONTRAST TECHNIQUE: Contiguous axial images were obtained from the base of the skull through the vertex without intravenous contrast. RADIATION DOSE REDUCTION: This exam was performed according to the departmental dose-optimization program which includes automated exposure control, adjustment of the mA and/or kV according to patient size and/or use of iterative reconstruction technique. COMPARISON:  Head CT  May 05, 2022. FINDINGS: Brain: No evidence of acute infarction, hemorrhage, hydrocephalus, extra-axial collection or mass lesion/mass effect. Vascular: Calcified plaques in the bilateral carotid siphons. Skull: Normal. Negative for fracture or focal lesion. 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 Mild mucosal thickening of the ethmoid cells. Mucosal thickening and fluid level within the bilateral maxillary sinuses with associated thickening of the sinus  walls, suggesting acute on chronic inflammatory process. Other: None. IMPRESSION: 1. No acute intracranial abnormality. 2. 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 Electronically Signed   By: Pedro Earls M.D.   On: 09/27/2022 12:56   DG Chest Portable 1 View  Result Date: 09/27/2022 CLINICAL DATA:  Concern for seizure EXAM: PORTABLE CHEST 1 VIEW COMPARISON:  Chest x-ray April 11, 2022 FINDINGS: The cardiomediastinal silhouette is unchanged in contour. Bibasilar linear and reticular opacities. No pleural effusion or pneumothorax. The visualized upper abdomen is unremarkable. No acute osseous abnormality. IMPRESSION: Bibasilar linear/reticular opacities. Differential considerations include atelectasis, aspiration, or infection. Electronically Signed   By: Beryle Flock M.D.   On: 09/27/2022 09:58    Pending Labs Unresulted Labs (From admission, onward)     Start     Ordered   09/28/22 3875  Basic metabolic panel  Tomorrow morning,   R        09/27/22 1436   09/28/22 0500  CBC  Tomorrow morning,   R        09/27/22 1436   09/27/22 1105  Urinalysis, Routine w reflex microscopic  Once,   URGENT        09/27/22 1104   09/27/22 0942  CBC with Differential  Once,   STAT        09/27/22 0941   09/27/22 0942  Rapid urine drug screen (hospital performed)  ONCE - STAT,   STAT        09/27/22 0941            Vitals/Pain Today's Vitals   09/27/22 1515 09/27/22 1530 09/27/22 1531 09/27/22 1547   BP: 123/72 133/75  133/75  Pulse: 100 97  97  Resp: 19 19    Temp:   98.4 F (36.9 C)   TempSrc:   Oral   SpO2: 96% 95%    PainSc:   Asleep     Isolation Precautions Airborne and Contact precautions  Medications Medications  LORazepam (ATIVAN) injection 0-4 mg ( Intravenous Not Given 09/27/22 1722)    Or  LORazepam (ATIVAN) tablet 0-4 mg ( Oral See Alternative 09/27/22 1722)  LORazepam (ATIVAN) injection 0-4 mg (has no administration in time range)    Or  LORazepam (ATIVAN) tablet 0-4 mg (has no administration in time range)  thiamine (VITAMIN B1) tablet 100 mg ( Oral See Alternative 09/27/22 1140)    Or  thiamine (VITAMIN B1) injection 100 mg (100 mg Intravenous Given 09/27/22 1140)  enoxaparin (LOVENOX) injection 40 mg (40 mg Subcutaneous Given 09/27/22 1625)  acetaminophen (TYLENOL) tablet 650 mg (has no administration in time range)    Or  acetaminophen (TYLENOL) suppository 650 mg (has no administration in time range)  valproate (DEPACON) 250 mg in dextrose 5 % 50 mL IVPB (has no administration in time range)  gabapentin (NEURONTIN) capsule 300 mg (300 mg Oral Given 09/27/22 1624)  sodium chloride 0.9 % bolus 1,000 mL (0 mLs Intravenous Stopped 09/27/22 1542)  LORazepam (ATIVAN) injection 2 mg (2 mg Intravenous Given 09/27/22 1140)  levETIRAcetam (KEPPRA) IVPB 1500 mg/ 100 mL premix (0 mg Intravenous Stopped 09/27/22 1136)  Ampicillin-Sulbactam (UNASYN) 3 g in sodium chloride 0.9 % 100 mL IVPB (0 g Intravenous Stopped 09/27/22 1233)    Mobility walks with person assist     Focused Assessments Neuro Assessment Handoff:  Swallow screen pass? Yes          Neuro Assessment:   Neuro Checks:      Has TPA been  given? No If patient is a Neuro Trauma and patient is going to OR before floor call report to 4N Charge nurse: [phone] or [phone]   R Recommendations: See Admitting Provider Note  Report given to:   Additional Notes: Pt intermittently lethargic but  easily arousable. Awaiting MRI

## 2022-09-27 NOTE — ED Provider Notes (Signed)
[facility] EMERGENCY DEPARTMENT Provider Note   CSN: [id number] Arrival date & time: 09/27/22  6568     History  Chief Complaint  Patient presents with   Seizures    Theresa Rios is a 57 y.o. female.  Patient here after supposedly maybe seizure-like activity lasted several seconds at the bus stop.  Patient states that she slept at a shelter last night.  She denies alcohol or drugs but did say she drank alcohol yesterday.  She denies seizure history.  She denies any cough or sputum production or pain.  She is not quite sure of the events today.  Denies withdrawal symptoms from alcohol in the past.  Denies any abdominal pain, chest pain, shortness of breath.  The history is provided by the patient and the EMS personnel.       Home Medications Prior to Admission medications   Medication Sig Start Date End Date Taking? Authorizing Provider  gabapentin (NEURONTIN) 300 MG capsule Take 1 capsule (300 mg total) by mouth 2 (two) times daily. 09/06/22   Starleen Blue, NP  nicotine polacrilex (NICORETTE) 2 MG gum Take 1 each (2 mg total) by mouth as needed for smoking cessation. 09/06/22   Starleen Blue, NP  OLANZapine zydis (ZYPREXA) 5 MG disintegrating tablet Take 1 tablet (5 mg total) by mouth at bedtime. 09/06/22   Starleen Blue, NP  sertraline (ZOLOFT) 50 MG tablet Take 1 tablet (50 mg total) by mouth daily. 09/07/22   Starleen Blue, NP  traZODone (DESYREL) 50 MG tablet Take 1 tablet (50 mg total) by mouth at bedtime as needed for sleep. 09/06/22   Starleen Blue, NP      Allergies    Patient has no known allergies.    Review of Systems   Review of Systems  Physical Exam Updated Vital Signs BP 139/81   Pulse 99   Temp 99.1 F (37.3 C) (Oral)   Resp (!) 23   LMP 05/17/2014   SpO2 96%  Physical Exam Vitals and nursing note reviewed.  Constitutional:      General: She is not in acute distress.    Appearance: She is well-developed. She is not  ill-appearing.  HENT:     Head: Normocephalic and atraumatic.     Mouth/Throat:     Mouth: Mucous membranes are moist.  Eyes:     Extraocular Movements: Extraocular movements intact.     Conjunctiva/sclera: Conjunctivae normal.     Pupils: Pupils are equal, round, and reactive to light.  Cardiovascular:     Rate and Rhythm: Normal rate and regular rhythm.     Pulses: Normal pulses.     Heart sounds: No murmur heard. Pulmonary:     Effort: Pulmonary effort is normal. No respiratory distress.     Breath sounds: Normal breath sounds.  Abdominal:     Palpations: Abdomen is soft.     Tenderness: There is no abdominal tenderness.  Musculoskeletal:        General: No swelling.     Cervical back: Normal range of motion and neck supple. No tenderness.  Skin:    General: Skin is warm and dry.     Capillary Refill: Capillary refill takes less than 2 seconds.  Neurological:     Mental Status: She is alert.     Comments: She appears to have grossly normal strength and sensation throughout, she is a little bit somnolent, speech is normal, pupils are equal and reactive  Psychiatric:  Mood and Affect: Mood normal.     ED Results / Procedures / Treatments   Labs (all labs ordered are listed, but only abnormal results are displayed) Labs Reviewed  RESP PANEL BY RT-PCR (RSV, FLU A&B, COVID)  RVPGX2 - Abnormal; Notable for the following components:      Result Value   SARS Coronavirus 2 by RT PCR POSITIVE (*)    All other components within normal limits  COMPREHENSIVE METABOLIC PANEL - Abnormal; Notable for the following components:   Glucose, Bld 137 (*)    All other components within normal limits  CBC WITH DIFFERENTIAL/PLATELET - Abnormal; Notable for the following components:   WBC 19.5 (*)    Neutro Abs 16.6 (*)    Monocytes Absolute 1.1 (*)    Abs Immature Granulocytes 0.15 (*)    All other components within normal limits  CBG MONITORING, ED - Abnormal; Notable for the  following components:   Glucose-Capillary 153 (*)    All other components within normal limits  ETHANOL  CBC WITH DIFFERENTIAL/PLATELET  RAPID URINE DRUG SCREEN, HOSP PERFORMED  URINALYSIS, ROUTINE W REFLEX MICROSCOPIC    EKG EKG Interpretation  Date/Time:  Friday September 27 2022 09:27:46 EST Ventricular Rate:  99 PR Interval:  174 QRS Duration: 86 QT Interval:  371 QTC Calculation: 477 R Axis:   -25 Text Interpretation: Sinus rhythm Confirmed by Lennice Sites ([id number]) on 09/27/2022 9:29:17 AM  Radiology CT Head Wo Contrast  Result Date: 09/27/2022 CLINICAL DATA:  Mental status change, unknown cause. EXAM: CT HEAD WITHOUT CONTRAST TECHNIQUE: Contiguous axial images were obtained from the base of the skull through the vertex without intravenous contrast. RADIATION DOSE REDUCTION: This exam was performed according to the departmental dose-optimization program which includes automated exposure control, adjustment of the mA and/or kV according to patient size and/or use of iterative reconstruction technique. COMPARISON:  Head CT May 05, 2022. FINDINGS: Brain: No evidence of acute infarction, hemorrhage, hydrocephalus, extra-axial collection or mass lesion/mass effect. Vascular: Calcified plaques in the bilateral carotid siphons. Skull: Normal. Negative for fracture or focal lesion. 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 Mild mucosal thickening of the ethmoid cells. Mucosal thickening and fluid level within the bilateral maxillary sinuses with associated thickening of the sinus walls, suggesting acute on chronic inflammatory process. Other: None. IMPRESSION: 1. No acute intracranial abnormality. 2. 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 Electronically Signed   By: Pedro Earls M.D.   On: 09/27/2022 12:56   DG Chest Portable 1 View  Result Date: 09/27/2022 CLINICAL DATA:  Concern for seizure EXAM: PORTABLE CHEST 1 VIEW COMPARISON:  Chest  x-ray April 11, 2022 FINDINGS: The cardiomediastinal silhouette is unchanged in contour. Bibasilar linear and reticular opacities. No pleural effusion or pneumothorax. The visualized upper abdomen is unremarkable. No acute osseous abnormality. IMPRESSION: Bibasilar linear/reticular opacities. Differential considerations include atelectasis, aspiration, or infection. Electronically Signed   By: Beryle Flock M.D.   On: 09/27/2022 09:58    Procedures Procedures    Medications Ordered in ED Medications  LORazepam (ATIVAN) injection 0-4 mg ( Intravenous Not Given 09/27/22 1141)    Or  LORazepam (ATIVAN) tablet 0-4 mg ( Oral See Alternative 09/27/22 1141)  LORazepam (ATIVAN) injection 0-4 mg (has no administration in time range)    Or  LORazepam (ATIVAN) tablet 0-4 mg (has no administration in time range)  thiamine (VITAMIN B1) tablet 100 mg ( Oral See Alternative 09/27/22 1140)    Or  thiamine (VITAMIN B1) injection  100 mg (100 mg Intravenous Given 09/27/22 1140)  sodium chloride 0.9 % bolus 1,000 mL (1,000 mLs Intravenous New Bag/Given 09/27/22 1003)  LORazepam (ATIVAN) injection 2 mg (2 mg Intravenous Given 09/27/22 1140)  levETIRAcetam (KEPPRA) IVPB 1500 mg/ 100 mL premix (0 mg Intravenous Stopped 09/27/22 1136)  Ampicillin-Sulbactam (UNASYN) 3 g in sodium chloride 0.9 % 100 mL IVPB (0 g Intravenous Stopped 09/27/22 1233)    ED Course/ Medical Decision Making/ A&P                             Medical Decision Making Amount and/or Complexity of Data Reviewed Labs: ordered. Radiology: ordered.  Risk OTC drugs. Prescription drug management. Decision regarding hospitalization.   PEACHIE BARKALOW is here after possible seizure activity at bus stop.  History of mental health disorder, polysubstance abuse.  She states that she drank alcohol yesterday.  Per chart review maybe there has been a history of seizure activity in the past may be thought to be from alcohol withdrawal.  She is not taking  any medications for seizures she states.  She is a little bit somnolent.  Borderline hypoxic with oxygen between 88 and 92.  Per chart review it sounds like she is abuse cocaine, benzodiazepines and alcohol in the past.  Neurologically she appears to be intact.  Her vital signs are normal otherwise.  Her EKG shows sinus rhythm per my review interpretation.  Is possible that she had seizure activity.  Frontal diagnosis is possibly seizure activity from withdrawal from alcohol, could be that there is some intoxication going on as well versus seems less likely to be traumatic/infectious process.  Will check basic labs including UDS, ethanol level.  Will get chest x-ray, head CT, give IV fluid bolus and continue to evaluate patient.  She was just admitted to behavioral health a few weeks ago and does not appear that she is taking any antiepileptics anymore.  She did have a normal MRI and EEG while in the hospital last year for seizure activity.  Suspect that this was from alcohol withdrawal.  Will check labs and reevaluate her.  She is denying any SI or HI at this time.  Upon reevaluation the patient was having some right side gaze preference, unresponsive, became more hypoxic and tachycardic, little bit tense in her upper arms, this seemed to self resolve after about 15 to 20 seconds.  She seemed confused and agitated afterwards.  My suspicion is that this was a seizure.  Her alcohol level per my review and interpretation labs undetectable.  Not sure if this is may be some alcohol withdrawal symptoms.  Will give her dose IV Ativan and loaded with IV Keppra.  Could also be polysubstance related as well.  Lab work otherwise shows a mild leukocytosis of 19 but otherwise no significant anemia or electrolyte abnormality.  Chest x-ray showed may be some aspiration.  She was hypoxic upon arrival as well and suspect may be there is aspiration pneumonia will give a dose IV Unasyn.  Patient was able to ambulate after this  event.  She appears to be returning back to baseline.  However I think she benefit from observation for further care.  She has not been taking any antiepileptics.  I will have neurology consulted but anticipate admission.  Awaiting CT scan of head will order EEG.  COVID test has come back as positive.  Hard to know when symptoms really started.  It is  possible that she has a secondary pneumonia from this.  Still think could be aspiration.  I have talked with Dr. Quinn Axe with neurology who will follow along and make recommendations.  Awaiting head CT and then will admit to medicine.  Head CT per radiology reports unremarkable.  Patient still sedated from [place] and Ativan.  No further seizure activities with me.  Overall my suspicion is for possibly alcohol withdrawal seizure/aspiration.  Possibly also related to [employer] although this could be incidental.  Will admit to medicine for further care.  Dr. Quinn Axe with neurology following along.  Will order EEG.  This chart was dictated using voice recognition software.  Despite best efforts to proofread,  errors can occur which can change the documentation meaning.         Final Clinical Impression(s) / ED Diagnoses Final diagnoses:  Seizure-like activity ([place])  COVID-19  Acute respiratory failure with hypoxia [facility])    Rx / DC Orders ED Discharge Orders     None         Lennice Sites, DO 09/27/22 1326

## 2022-09-27 NOTE — H&P (Signed)
Date: 09/27/2022         Patient Name:  Theresa Rios MRN: [id number]  DOB: April 01, 1966 Age / Sex: 57 y.o., female   PCP: Synthia Innocent Audrea Muscat, NP         Medical Service: Internal Medicine Teaching Service         Attending Physician: Dr. Aldine Contes, MD    First Contact: Dr. Nani Gasser Pager: [phone]  Second Contact: Dr. Buddy Duty Pager: [phone]       After Hours (After 5p/  First Contact Pager: [phone]  weekends / holidays): Second Contact Pager: [phone]   Chief Concern:  Seizure like activity  History of Present Illness:  Theresa Rios is a 57 yo female with a history depression, alcohol use disorder, and housing instability who presents to [facility] with seizure like activity. The patient slept at a shelter last night and was noted to have possible seizure-like activity at a bus stop this morning. By the time of our evaluation, the patient was somnolent, as she had been loaded with ativan and keppra. She is able to open her eyes and follow some commands, however, she quickly falls back asleep and is not able to participate in the interview.   Reevaluated this patient when she was more awake.  She complains of having to urinate, but an inability to do so.  She has never had urinary issues before.  She drinks a few beers per day.  She has never suffered from alcohol withdrawal that was severe.  Last seizure was a few weeks ago, unsure what her triggers are.   Review of Systems  Genitourinary:  Positive for dysuria.   Medications:  Zyprexa 5 mg qhs Zoloft 50 mg daily Trazodone 50 mg qhs prn  Allergies: NKDA  Past Medical History: Alcohol use disorder Depression Seizures  Family History:  Unable to obtain  Social History:  Unable to obtain, however, cocaine and marijuana use have been documented in the chart as well as tobacco use (0.5 pack per day).  Drinks 3-4 beers a day.   Physical Exam: Blood pressure 133/75, pulse 97, temperature 98.4 F  (36.9 C), temperature source Oral, resp. rate 19, last menstrual period 05/17/2014, SpO2 95 %.  Uncomfortable appearing female laying in bed, active and moving around Regular tachycardia, radial pulses normal, no lower extremity edema Breathing is regular and unlabored, lungs are clear to auscultation Abdomen is soft and nontender Skin is warm and dry She is globally weak with 2-3+ strength in all 4 extremities, she is alert and oriented, she has no focal deficits Mood and affect are concordant  EKG:  Sinus rhythm without evidence of acute ischemic changes  Labs:  Glucose 137 CMP within normal limits WBCs 19.5 COVID-19 positive Ethanol undetectable  Images and other studies: CXR with some mild bibasilar opacities CT head without acute intracranial abnormalities EEG with diffuse slowing consistent with benzodiazepine therapy but no obvious signs of epileptic activity  Assessment & Plan  Theresa Rios is a 57 y.o. with depression and alcohol use disorder who presented with seizure like activity and admitted for possible seizures.  Principal Problem:   Seizure-like activity ([place])  Seizure like activities Alcohol use disorder The patient had a previous admission back in August for alcohol withdrawal seizures. At that time, she required a librium taper and EEG and MRI that were performed were unremarkable. She was ultimately discharged with keppra and gabapentin and was advised to follow up with neurology, however,  she did not do this. Today, the patient presented with seizure like activity while at the bus stop. The ED provider noted the patient to become unresponsive and to have a right sided gaze preference at one point, also became more hypoxic and tachycardic, thus was loaded with ativan and keppra as it was suspected she was having a seizure. CT head was negative for any acute findings.  Spot EEG was negative for epileptiform activity. CK within normal limits. Valproate in favor  of levetiracetam for mood stabilization given this patient's comorbid depression and history of suicide attempt. - Neurology consulted, appreciate recs - Continue CIWA with ativan - Valproate 250 mg q6h  - Thiamine 100 mg daily  - Gabapentin 300 mg  Acute hypoxic respiratory failure 2/2 aspiration event and possibly COVID COVID-19 Lab work showed a leukocytosis to 19.5, but otherwise CBC and CMP are unremarkable. CXR showed possible aspiration and the patient was hypoxic upon arrival. She was given a dose of unasyn in the ED, but also was noted to test positive for COVID (unsure if incidental or if this is contributing to AHRF).  Leukocytosis may also be secondary to seizure. - Oxygen therapy to maintain sats greater than 92% - Dexamethasone 6 mg IV once   Possible urinary retention Patient continuously notes that she has to urinate, however, has a PurWick in place and is not able to urinate.  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  Karma Greaser, reports that she has run into multiple barriers to mental health care and as such has been homeless for the past few weeks.  Continue home meds. - Olanzapine 5 mg nightly - Sertraline 50 mg daily - Trazodone 50 mg nightly as needed for sleep  Diet: NPO IVF: None VTE: Lovenox Code: Full - need to confirm when patient is more awake Surrogate: Spouse  Admit patient to Observation with expected length of stay less than 2 midnights.  Signed: [name] 09/27/2022, 3:41 PM  Pager: [phone] After 5pm on weekdays and 1pm on weekends: [phone]

## 2022-09-27 NOTE — Progress Notes (Signed)
EEG complete - results pending 

## 2022-09-28 LAB — BASIC METABOLIC PANEL
Anion gap: 11 (ref 5–15)
BUN: 8 mg/dL (ref 6–20)
CO2: 22 mmol/L (ref 22–32)
Calcium: 9.1 mg/dL (ref 8.9–10.3)
Chloride: 103 mmol/L (ref 98–111)
Creatinine, Ser: 0.58 mg/dL (ref 0.44–1.00)
GFR, Estimated: >60 mL/min (ref >60)
Glucose, Bld: 155 mg/dL — ABNORMAL HIGH (ref 70–99)
Potassium: 3.5 mmol/L (ref 3.5–5.1)
Sodium: 136 mmol/L (ref 135–145)

## 2022-09-28 LAB — CBC
HCT: 39.5 % (ref 36.0–46.0)
Hemoglobin: 12.6 g/dL (ref 12.0–15.0)
MCH: 27.5 pg (ref 26.0–34.0)
MCHC: 31.9 g/dL (ref 30.0–36.0)
MCV: 86.1 fL (ref 80.0–100.0)
Platelets: 179 10*3/uL (ref 150–400)
RBC: 4.59 MIL/uL (ref 3.87–5.11)
RDW: 15 % (ref 11.5–15.5)
WBC: 10.1 10*3/uL (ref 4.0–10.5)
nRBC: 0 % (ref 0.0–0.2)

## 2022-09-28 MED ORDER — FOLIC ACID 1 MG PO TABS
1.0000 mg | ORAL_TABLET | Freq: Every day | ORAL | Status: DC
  Administered 2022-09-28: 1 mg via ORAL
  Filled 2022-09-28: qty 1

## 2022-09-28 MED ORDER — GABAPENTIN 300 MG PO CAPS: 300.0000 mg | ORAL_CAPSULE | Freq: Two times a day (BID) | ORAL | 0 refills | Status: DC

## 2022-09-28 MED ORDER — ADULT MULTIVITAMIN W/MINERALS CH
1.0000 | ORAL_TABLET | Freq: Every day | ORAL | Status: DC
  Administered 2022-09-28: 1 via ORAL
  Filled 2022-09-28: qty 1

## 2022-09-28 MED ORDER — DIVALPROEX SODIUM 500 MG PO DR TAB: 500.0000 mg | DELAYED_RELEASE_TABLET | Freq: Two times a day (BID) | ORAL | 0 refills | Status: DC

## 2022-09-28 MED ORDER — DIVALPROEX SODIUM 250 MG PO DR TAB
500.0000 mg | DELAYED_RELEASE_TABLET | Freq: Two times a day (BID) | ORAL | Status: DC
  Administered 2022-09-28: 500 mg via ORAL
  Filled 2022-09-28: qty 2

## 2022-09-28 NOTE — Discharge Instructions (Addendum)
[name]  You were treated in the hospital for seizures.  You are also found to have COVID-19.  Your diagnostic evaluation was reassuring. We are discharging you now that you are doing better. To help assist you on your road to recovery, I have written the following recommendations:   You have been recommended for a program, called the match program.  This enables you to get medications at very low cost.  Your antiseizure medicines including your Depakote (divalproex) and Neurontin (gabapentin) are your most important medicines.  Please pick these up from the pharmacy as soon as you can.  Your medications have been sent to the [facility] on [address] [name] can use the #3 bus outbound towards [employer] that stops on [address] in front of the hospital to get there. Get off at the 4th stop and take a left on [address]. [name] [facility] will be on your left after a short walk.  Follow-up with your primary care doctor soon as possible after leaving the hospital.  It was a privilege to be a part of your hospital care team, and I hope you feel better as a result of your stay.  All the best, Nani Gasser, MD

## 2022-09-28 NOTE — Care Management (Addendum)
Provided with [place] letter to bedside. Normally uses [facility], but they are closed on the weekends. Discussed [place] participating pharmacies with attending. Provided with 4 bus passes by CSW.  Stays at [facility], they are open for [place] (cold temperature) Shelter.  Will provide cab voucher to pharmacy, patient can take bus downtown from the pharmacy.  Entered copay overrides for meds, patient cost will be $0.  Patient provided pants from [facility] closet

## 2022-09-28 NOTE — Progress Notes (Signed)
Neurology plan of care  Please see neurology consult note from yesterday for full findings and recommendations.  MRI brain with and without contrast was normal.  Routine EEG was normal.  Patient has had multiple seizures including outside of the setting of alcohol withdrawal and substance abuse therefore recommend continuing AED therapy indefinitely. Final recommendations:  - Depakote 500mg DR BID - Check levels in 1-2 weeks outpatient if possible, if patient does not have PCP please refer to establish care - Continue home gabapentin - In the interest of facilitating primary care and continued contact with outpatient provider I will not refer to neurology at this time. If PCP does not feel comfortable managing depakote or if additional neurologic concerns arise she may be referred to outpatient neurology by PCP  Neurology will be available prn for questions going forward.  Makiyla Linch, MD [facility] [phone]  If 7pm- 7am, please page neurology on call as listed in AMION.  

## 2022-09-28 NOTE — Progress Notes (Signed)
Internal Medicine Attending:   I saw and examined the patient. I reviewed the resident's H&P note and I agree with the resident's findings and plan as documented in the resident's note.  In brief, patient is 57 year old female with past medical history of depression, TBI, seizure disorder alcohol use disorder and housing instability who presented to the ED with seizure-like activity x 1 episode.  Patient was at the bus stop yesterday morning and was noted to have possible seizure-like activity.  Patient states that she does not remember the episode and the last thing she remembered was being at the bus stop before being in the hospital.  In the ED, patient had an EEG done with no evidence of seizure activity as well as imaging of her brain including MRI and CT with no acute abnormalities.  Of note, patient did test positive for COVID.  She was admitted to the hospital for further evaluation.  Patient states that she feels like she is back to her baseline this morning and denies any new complaints.  Vitals:   09/28/22 0500 09/28/22 0939  BP: 117/74 121/81  Pulse: 82 73  Resp:  17  Temp:  98.2 F (36.8 C)  SpO2:  93%   On exam, patient is lying in bed in no apparent distress.  Lungs clear to auscultation bilaterally.  Cardiovascular exam reveals regular rate and rhythm with normal heart sounds.  Abdomen is soft, nontender, nondistended with normoactive bowel sounds.  Lower extremities are nontender to palpation with no edema noted.  Patient's strength is 5 out of 5 in bilateral upper and lower extremities and she is currently oriented x 3.  Patient's mood and affect are normal.  Assessment and plan:  1.  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  Patient states that she has not been drinking much alcohol lately and her alcohol level here was undetectable.   She has no signs of alcohol withdrawal today and he does not believe that the seizure was secondary to acute alcohol withdrawal.  Patient states that she is not on any antiepileptic drugs at home and I suspect that her current seizure episode may be secondary to her history of seizure disorder is not on any medication currently. -Will continue with Depakote per neurology -Will restart home gabapentin -Continue with thiamine -Will continue with CIWA without Ativan and DC currently scheduled Ativan to see if she has any more withdrawal episodes.  Patient's CIWA scores have been 0 overnight and this morning. -Imaging including MRI brain and CT head without any acute pathology noted -EEG did not reveal any seizure activity -Will follow-up neuro recommendations today but I suspect patient should be stable for DC home later today on oral Depakote.  2.  Acute hypoxic respiratory failure: -Patient did have an episode of hypoxia yesterday in the ED in the setting of seizure-like activity and was noted to be COVID-positive.  I do not believe that her hypoxia was secondary to her COVID-19 infection.  However, patient's leukocytosis has resolved and her O2 sats have been in the 90s on room air.  I suspect that her hypoxic event is likely secondary to her seizure-like activity rather than her COVID-19 infection or aspiration pneumonia. -Will DC antibiotics and monitor -Will DC steroids -Patient is currently asymptomatic -Will continue to monitor closely

## 2022-09-28 NOTE — Progress Notes (Deleted)
Interval history Feels okay today. Urinating without problems. Surprised to know she tested positive for COVID. Denies cough, chest pain, SOB.  Physical exam Blood pressure 98/63, pulse 76, temperature 98.7 F (37.1 C), temperature source Oral, resp. rate 16, last menstrual period 05/17/2014, SpO2 92 %.  Comfortable appearing female Heart rate and rhythm are regular Breathing is regular and unlabored Skin is warm and dry No focal neurologic deficits Mood is pleasant, affect is concordant  Labs Within normal limits  Assessment and plan Hospital day 0  Theresa Rios is a 57 y.o. admitted for seizures.  Active Problems:   MDD (major depressive episode), single episode, severe, no psychosis ([place])   Alcohol use disorder   COVID-19  Seizure like activities Stable. No seizure activity overnight. Neurologic exam is reassuring. Labs and images are within normal limits. Barriers to good outcome are psychosocial and include comorbid severe mood disorder and homelessness. Patient eligible for [facility] program and can get outpatient anticonvulsants for $3 for 30-day supply. Continuing Depakote and gabapentin. Low concern for alcohol-related seizures at this point.  - Continue CIWA, without Ativan - Valproate 500 mg PO BID - Thiamine 100 mg daily  - Folate 1 mg daily - Gabapentin 300 mg BID  Hypoxic respiratory failure, resolved COVID-19 Asymptomatic. Suspect hypoxic respiratory failure due to seizure rather than COVID-19 pneumonia or aspiration.  Discharge planning Patient homeless and stays at [facility]. She isn't fully clothed and has no phone or cash. Health Department, where she gets her medications, is closed this weekend, as is [place]. Although she qualifies for [facility] program and has been provided with bus passes, I don't feel that this meets criteria for a safe discharge plan. Temperatures outside are currently subfreezing and the patient would be released in paper  scrubs. I'm not confident that she has sufficient knowledge of the local bus system to make it to the pharmacy for important anticonvulsants, and she doesn't have any cash for her medicine, anyway. She is medically ready for discharge, but until conditions are safe for discharge, she will stay.  Severe major depression with history of suicide attempt Was recently hospitalized for suicidal ideations with plan to overdose on medications.  Continue home meds. - Olanzapine 5 mg nightly - Sertraline 50 mg daily - Trazodone 50 mg nightly as needed for sleep  Diet: regular VTE: lovenox Code: full Family Update: updated friend, Carver Fila, by phone  Discharge plan: pending   [name] 09/28/2022, 2:26 PM  Pager: [phone] After 5pm on weekdays and 1pm on weekends: [phone]

## 2022-09-28 NOTE — Progress Notes (Signed)
Pt is for discharge to [facility], instruction given to pass by first to [place] to pick up the medicine before going  to the facility, taxi will be waiting from the main entrance going to facility. Pt still alert oriented not in distress on room air, all belongings taken by the pt, send to main lobby via wheel chair

## 2022-09-28 NOTE — Progress Notes (Signed)
Admitted to 3w36 from ED via stretcher by transporter. Pt is in and out of alertnes. Oriented x4.  Breathing w/o difficulty and in NAD. Pt denied pain. MAE well. VS taken, placed on telemetry, and POC reviewed with pt. Pt went for MRI of brain.

## 2022-09-28 NOTE — Progress Notes (Signed)
Patient AVS reviewed. Called and set up [facility] to pick patient up at 8 pm per patient request to go to pharmacy and then [facility]. Patient states she cannot gain entrance until after 8 pm to [facility].

## 2022-09-28 NOTE — Discharge Summary (Signed)
Name: Theresa Rios MRN: [id number] DOB: September 29, 1965 57 y.o. PCP: Marliss Coots, NP  Date of Admission: 09/27/2022  9:21 AM Date of Discharge: 09/28/2022 3:27 PM Attending Physician: Aldine Contes, MD  Discharge Diagnosis: Active Problems:   MDD (major depressive episode), single episode, severe, no psychosis ([place])   Alcohol use disorder   COVID-19   Discharge Medications: Allergies as of 09/28/2022   No Known Allergies      Medication List     TAKE these medications    divalproex 500 MG DR tablet Commonly known as: DEPAKOTE Take 1 tablet (500 mg total) by mouth every 12 (twelve) hours.   gabapentin 300 MG capsule Commonly known as: NEURONTIN Take 1 capsule (300 mg total) by mouth 2 (two) times daily. What changed: Another medication with the same name was removed. Continue taking this medication, and follow the directions you see here.   nicotine polacrilex 2 MG gum Commonly known as: NICORETTE Take 1 each (2 mg total) by mouth as needed for smoking cessation.   OLANZapine zydis 5 MG disintegrating tablet Commonly known as: ZYPREXA Take 1 tablet (5 mg total) by mouth at bedtime.   sertraline 50 MG tablet Commonly known as: ZOLOFT Take 1 tablet (50 mg total) by mouth daily.   traZODone 50 MG tablet Commonly known as: DESYREL Take 1 tablet (50 mg total) by mouth at bedtime as needed for sleep.        Follow-up Appointments:  Follow-up Information     Placey, Audrea Muscat, NP. Call in 2 day(s).   Why: Call for an appointment as soon as possible after discharge from the hospital. Contact information: [address] [place] [address] [phone]                 Disposition and follow-up:   [name] is a 57 y.o. year old admitted for seizures.  Seizure Discharged in good condition. No seizures after admission. Depakote selected for continued outpatient management because of mood stabilizing properties in this patient with  severe comorbid depression. - Depakote 500 mg BID - Gabapentin 300 mg BID - Check Depakote level in a couple of weeks  Hospital Course by problem list: Seizure Presented after being found down at bus stop. Witnessed seizure-like activity in ED with right gaze preference, bilateral UE rigidity, tachycardia, and hypoxia. Aborted with Ativan and loaded with Keppra. Imaging and EEG results unremarkable. Labs unremarkable except for incidental COVID-19. CXR suggestive of bilateral infiltrates interpreted as aspiration in setting of seizure so she was given a dose of Unasyn. Her condition stabilized rapidly and by hospital day 2 she was at her baseline. She was transitioned to oral Depakote for mood stabilizing properties in setting of comorbid severe depression. Discharge planning was complicated for this patient. She arrived to the hospital unclothed over a weekend when many resources were unavailable. She received pharmacy assistance through [facility] program and co-pay was waived for her anticonvulsants, including 30 day supply of Depakote and gabapentin. Cab voucher was provided on discharge to take her directly to pharmacy from hospital. She was given bus passes for transport to homeless shelter. Pants were provided for protection against subfreezing outdoor temperatures.  Discharge Exam:  Feels okay today. Urinating without problems. Surprised to know she tested positive for COVID. Denies cough, chest pain, SOB.   Physical exam Blood pressure 98/63, pulse 76, temperature 98.7 F (37.1 C), temperature source Oral, resp. rate 16, last menstrual period 05/17/2014, SpO2 92 %.  Comfortable appearing female Heart rate and rhythm are regular Breathing is regular and unlabored Skin is warm and dry No focal neurologic deficits Mood is pleasant, affect is concordant  Pertinent studies and procedures:  CT head, MRI brain, both negative for acute intracranial pathology to explain seizure Interictal EEG  unremarkable  Discharge Instructions:   Discharge Instructions      [name]  You were treated in the hospital for seizures.  You are also found to have COVID-19.  Your diagnostic evaluation was reassuring. We are discharging you now that you are doing better. To help assist you on your road to recovery, I have written the following recommendations:   You have been recommended for a program, called the match program.  This enables you to get medications at very low cost.  Your antiseizure medicines including your Depakote (divalproex) and Neurontin (gabapentin) are your most important medicines.  Please pick these up from the pharmacy as soon as you can.  Your medications have been sent to the [facility] on [address] [name] can use the #3 bus outbound towards [employer] that stops on [address] in front of the hospital to get there. Get off at the 4th stop and take a left on [address]. [name] [facility] will be on your left after a short walk.  Follow-up with your primary care doctor soon as possible after leaving the hospital.  It was a privilege to be a part of your hospital care team, and I hope you feel better as a result of your stay.  All the best, Nani Gasser, MD     [name] 09/28/2022, 3:27 PM

## 2022-09-28 NOTE — Plan of Care (Signed)
Neurology plan of care  Please see neurology consult note from yesterday for full findings and recommendations.  MRI brain with and without contrast was normal.  Routine EEG was normal.  Patient has had multiple seizures including outside of the setting of alcohol withdrawal and substance abuse therefore recommend continuing AED therapy indefinitely. Final recommendations:  - Depakote 500mg  DR BID - Check levels in 1-2 weeks outpatient if possible, if patient does not have PCP please refer to establish care - Continue home gabapentin - In the interest of facilitating primary care and continued contact with outpatient provider I will not refer to neurology at this time. If PCP does not feel comfortable managing depakote or if additional neurologic concerns arise she may be referred to outpatient neurology by PCP  Neurology will be available prn for questions going forward.  Su Monks, MD [facility] [phone]  If 7pm- 7am, please page neurology on call as listed in [place].

## 2022-10-01 ENCOUNTER — Encounter (HOSPITAL_COMMUNITY): Payer: Self-pay | Admitting: Emergency Medicine

## 2022-10-01 ENCOUNTER — Other Ambulatory Visit: Payer: Self-pay

## 2022-10-01 ENCOUNTER — Emergency Department (HOSPITAL_COMMUNITY)
Admission: EM | Admit: 2022-10-01 | Discharge: 2022-10-01 | Disposition: A | Discharge status: Home / Self Care | Payer: Self-pay | Attending: Emergency Medicine | Admitting: Emergency Medicine

## 2022-10-01 DIAGNOSIS — K4090 Unilateral inguinal hernia, without obstruction or gangrene, not specified as recurrent: Secondary | ICD-10-CM | POA: Insufficient documentation

## 2022-10-01 LAB — COMPREHENSIVE METABOLIC PANEL
ALT: 14 U/L (ref 0–44)
AST: 26 U/L (ref 15–41)
Albumin: 3.5 g/dL (ref 3.5–5.0)
Alkaline Phosphatase: 74 U/L (ref 38–126)
Anion gap: 9 (ref 5–15)
BUN: 10 mg/dL (ref 6–20)
CO2: 25 mmol/L (ref 22–32)
Calcium: 9 mg/dL (ref 8.9–10.3)
Chloride: 110 mmol/L (ref 98–111)
Creatinine, Ser: 0.7 mg/dL (ref 0.44–1.00)
GFR, Estimated: >60 mL/min (ref >60)
Glucose, Bld: 124 mg/dL — ABNORMAL HIGH (ref 70–99)
Potassium: 3.6 mmol/L (ref 3.5–5.1)
Sodium: 144 mmol/L (ref 135–145)
Total Bilirubin: 0.2 mg/dL — ABNORMAL LOW (ref 0.3–1.2)
Total Protein: 7 g/dL (ref 6.5–8.1)

## 2022-10-01 LAB — CBC WITH DIFFERENTIAL/PLATELET
Abs Immature Granulocytes: 0 10*3/uL (ref 0.00–0.07)
Basophils Absolute: 0 10*3/uL (ref 0.0–0.1)
Basophils Relative: 0 %
Eosinophils Absolute: 0.1 10*3/uL (ref 0.0–0.5)
Eosinophils Relative: 1 %
HCT: 36.1 % (ref 36.0–46.0)
Hemoglobin: 11.6 g/dL — ABNORMAL LOW (ref 12.0–15.0)
Lymphocytes Relative: 36 %
Lymphs Abs: 3.5 10*3/uL (ref 0.7–4.0)
MCH: 28.2 pg (ref 26.0–34.0)
MCHC: 32.1 g/dL (ref 30.0–36.0)
MCV: 87.8 fL (ref 80.0–100.0)
Monocytes Absolute: 0.6 10*3/uL (ref 0.1–1.0)
Monocytes Relative: 6 %
Neutro Abs: 5.6 10*3/uL (ref 1.7–7.7)
Neutrophils Relative %: 57 %
Platelets: 181 10*3/uL (ref 150–400)
RBC: 4.11 MIL/uL (ref 3.87–5.11)
RDW: 15.2 % (ref 11.5–15.5)
WBC: 9.8 10*3/uL (ref 4.0–10.5)
nRBC: 0 % (ref 0.0–0.2)
nRBC: 0 /100 WBC

## 2022-10-01 LAB — URINALYSIS, ROUTINE W REFLEX MICROSCOPIC
Bilirubin Urine: NEGATIVE
Glucose, UA: NEGATIVE mg/dL
Hgb urine dipstick: NEGATIVE
Ketones, ur: NEGATIVE mg/dL
Leukocytes,Ua: NEGATIVE
Nitrite: NEGATIVE
Protein, ur: NEGATIVE mg/dL
Specific Gravity, Urine: 1.014 (ref 1.005–1.030)
pH: 6 (ref 5.0–8.0)

## 2022-10-01 LAB — LIPASE, BLOOD: Lipase: 27 U/L (ref 11–51)

## 2022-10-01 NOTE — ED Provider Notes (Signed)
[facility] Provider Note   CSN: [id number] Arrival date & time: 10/01/22  1515     History  Chief Complaint  Patient presents with   Abdominal Pain    Theresa Rios is a 57 y.o. female.  57 yo F with a chief complaints of left inguinal pain.  She tells me that someone in the remote past and told her that she has a hernia there.  She unfortunately does not have the finances to follow-up with a general surgeon in the office and was waiting for her Medicaid to go through so that she could be seen.  She feels like it has been off and on for some time.  She is wondering if we could fix that here today.  Denies nausea vomiting fevers.  She was recently seen a couple days ago was diagnosed with COVID.  She is wondering if there is a medicine that could maybe fix that for her.   Abdominal Pain      Home Medications Prior to Admission medications   Medication Sig Start Date End Date Taking? Authorizing Provider  divalproex (DEPAKOTE) 500 MG DR tablet Take 1 tablet (500 mg total) by mouth every 12 (twelve) hours. 09/28/22   Nani Gasser, MD  gabapentin (NEURONTIN) 300 MG capsule Take 1 capsule (300 mg total) by mouth 2 (two) times daily. 09/28/22   Nani Gasser, MD  nicotine polacrilex (NICORETTE) 2 MG gum Take 1 each (2 mg total) by mouth as needed for smoking cessation. Patient not taking: Reported on 09/28/2022 09/06/22   Nicholes Rough, NP  OLANZapine zydis (ZYPREXA) 5 MG disintegrating tablet Take 1 tablet (5 mg total) by mouth at bedtime. 09/06/22   Nicholes Rough, NP  sertraline (ZOLOFT) 50 MG tablet Take 1 tablet (50 mg total) by mouth daily. 09/07/22   Nicholes Rough, NP  traZODone (DESYREL) 50 MG tablet Take 1 tablet (50 mg total) by mouth at bedtime as needed for sleep. 09/06/22   Nicholes Rough, NP      Allergies    Patient has no known allergies.    Review of Systems   Review of Systems  Gastrointestinal:  Positive  for abdominal pain.    Physical Exam Updated Vital Signs BP 100/64 (BP Location: Left Arm)   Pulse (!) 102   Temp 99.2 F (37.3 C) (Oral)   Resp 14   Ht 5\' 2"  (1.575 m)   Wt 73 kg   LMP 05/17/2014   SpO2 91%   BMI 29.45 kg/m  Physical Exam Vitals and nursing note reviewed.  Constitutional:      General: She is not in acute distress.    Appearance: She is well-developed. She is not diaphoretic.  HENT:     Head: Normocephalic and atraumatic.  Eyes:     Pupils: Pupils are equal, round, and reactive to light.  Cardiovascular:     Rate and Rhythm: Normal rate and regular rhythm.     Heart sounds: No murmur heard.    No friction rub. No gallop.  Pulmonary:     Effort: Pulmonary effort is normal.     Breath sounds: No wheezing or rales.  Abdominal:     General: There is no distension.     Palpations: Abdomen is soft.     Tenderness: There is no abdominal tenderness.  Genitourinary:    Comments: There is some left inguinal fullness that seems to get better with direct pressure.  It is difficult to  palpate an obvious fascial defect.  No erythema no warmth no fluctuance Musculoskeletal:        General: No tenderness.     Cervical back: Normal range of motion and neck supple.  Skin:    General: Skin is warm and dry.  Neurological:     Mental Status: She is alert and oriented to person, place, and time.  Psychiatric:        Behavior: Behavior normal.     ED Results / Procedures / Treatments   Labs (all labs ordered are listed, but only abnormal results are displayed) Labs Reviewed  CBC WITH DIFFERENTIAL/PLATELET - Abnormal; Notable for the following components:      Result Value   Hemoglobin 11.6 (*)    All other components within normal limits  COMPREHENSIVE METABOLIC PANEL - Abnormal; Notable for the following components:   Glucose, Bld 124 (*)    Total Bilirubin 0.2 (*)    All other components within normal limits  URINALYSIS, ROUTINE W REFLEX MICROSCOPIC -  Abnormal; Notable for the following components:   APPearance HAZY (*)    All other components within normal limits  LIPASE, BLOOD    EKG None  Radiology No results found.  Procedures Hernia reduction  Date/Time: 10/01/2022 7:45 PM  Performed by: Deno Etienne, DO Authorized by: Deno Etienne, DO  Consent: Verbal consent obtained. Risks and benefits: risks, benefits and alternatives were discussed Consent given by: patient Patient understanding: patient states understanding of the procedure being performed Patient consent: the patient's understanding of the procedure matches consent given Patient identity confirmed: verbally with patient Time out: Immediately prior to procedure a "time out" was called to verify the correct patient, procedure, equipment, support staff and site/side marked as required. Local anesthesia used: no  Anesthesia: Local anesthesia used: no  Sedation: Patient sedated: no  Patient tolerance: patient tolerated the procedure well with no immediate complications       Medications Ordered in ED Medications - No data to display  ED Course/ Medical Decision Making/ A&P                             Medical Decision Making  57 yo F with a chief complaint of left inguinal pain.  This has been an ongoing issue for her.  She was told she has a hernia and wants to have it fixed here today.  I discussed with her about the reasons for emergent hernia repair.  I encouraged her to follow-up as an outpatient.  The patient was actually seen about 48 hours ago on my record review, and had a seizure and spent the night in the hospital.  She was diagnosed with COVID at that visit.  She is not hypoxic here.  Clear lung sounds for me.  7:45 PM:  I have discussed the diagnosis/risks/treatment options with the patient.  Evaluation and diagnostic testing in the emergency department does not suggest an emergent condition requiring admission or immediate intervention beyond what  has been performed at this time.  They will follow up with PCP, Gen surgery. We also discussed returning to the ED immediately if new or worsening sx occur. We discussed the sx which are most concerning (e.g., sudden worsening pain, fever, inability to tolerate by mouth) that necessitate immediate return. Medications administered to the patient during their visit and any new prescriptions provided to the patient are listed below.  Medications given during this visit Medications - No data  to display   The patient appears reasonably screen and/or stabilized for discharge and I doubt any other medical condition or other [facility] requiring further screening, evaluation, or treatment in the ED at this time prior to discharge.          Final Clinical Impression(s) / ED Diagnoses Final diagnoses:  Left inguinal hernia    Rx / DC Orders ED Discharge Orders     None         Melene Plan, DO 10/01/22 1946

## 2022-10-01 NOTE — ED Provider Triage Note (Signed)
Emergency Medicine Provider Triage Evaluation Note  Theresa Rios , a 57 y.o. female  was evaluated in triage.  Pt complains of concerns for LLQ intermittently x 6 months. Notes that she has a history of a hernia, however, was unable to get it surgically repaired due to insurance cost. Denies nausea, vomiting. Pt notes that she had two beers this morning.   Review of Systems  Positive:  Negative:   Physical Exam  LMP 05/17/2014  Gen:   Awake, no distress   Resp:  Normal effort  MSK:   Moves extremities without difficulty  Other:  Mild TTP noted to LLQ  Medical Decision Making  Medically screening exam initiated at 3:16 PM.  Appropriate orders placed.  LONDAN COPLEN was informed that the remainder of the evaluation will be completed by another provider, this initial triage assessment does not replace that evaluation, and the importance of remaining in the ED until their evaluation is complete.  Work-up initiated.    Gabriele Zwilling A, PA-C 10/01/22 1536

## 2022-10-01 NOTE — ED Triage Notes (Addendum)
Pt bib [facility] for intermittent LLQ abdominal pain x6 months. Hx of hernia that pt states she needs surgery for but has been unable to get due to insurance reasons.   BP 140/78. HR 116, Spo2 96%

## 2022-10-01 NOTE — Discharge Instructions (Signed)
Take tylenol 2 pills 4 times a day and motrin 4 pills 3 times a day.  Drink plenty of fluids.  Return for worsening shortness of breath, headache, confusion. Follow up with your family doctor.   I think it is a bit worse on you then please lay back relax and hold slow increasing pressure to the area.  If this reduces the area to get better and then you do not need to be seen in the emergency department.  If this does not help you if you develop fevers or vomiting then please come to the emergency department to be evaluated.

## 2022-10-25 ENCOUNTER — Other Ambulatory Visit: Payer: Self-pay | Admitting: Student

## 2022-11-27 ENCOUNTER — Emergency Department (HOSPITAL_COMMUNITY): Payer: Medicaid Other

## 2022-11-27 ENCOUNTER — Other Ambulatory Visit: Payer: Self-pay

## 2022-11-27 ENCOUNTER — Encounter (HOSPITAL_COMMUNITY): Payer: Self-pay | Admitting: Internal Medicine

## 2022-11-27 ENCOUNTER — Observation Stay (HOSPITAL_COMMUNITY)
Admission: EM | Admit: 2022-11-27 | Discharge: 2022-11-29 | Disposition: A | Discharge status: Home / Self Care | Payer: Medicaid Other | Attending: Emergency Medicine | Admitting: Emergency Medicine

## 2022-11-27 ENCOUNTER — Observation Stay (HOSPITAL_COMMUNITY): Payer: Medicaid Other

## 2022-11-27 DIAGNOSIS — R569 Unspecified convulsions: Secondary | ICD-10-CM | POA: Diagnosis not present

## 2022-11-27 DIAGNOSIS — D72829 Elevated white blood cell count, unspecified: Secondary | ICD-10-CM | POA: Insufficient documentation

## 2022-11-27 DIAGNOSIS — R918 Other nonspecific abnormal finding of lung field: Secondary | ICD-10-CM | POA: Insufficient documentation

## 2022-11-27 DIAGNOSIS — J690 Pneumonitis due to inhalation of food and vomit: Secondary | ICD-10-CM | POA: Insufficient documentation

## 2022-11-27 DIAGNOSIS — F17210 Nicotine dependence, cigarettes, uncomplicated: Secondary | ICD-10-CM | POA: Insufficient documentation

## 2022-11-27 DIAGNOSIS — G40909 Epilepsy, unspecified, not intractable, without status epilepticus: Principal | ICD-10-CM | POA: Insufficient documentation

## 2022-11-27 DIAGNOSIS — F1090 Alcohol use, unspecified, uncomplicated: Secondary | ICD-10-CM | POA: Diagnosis present

## 2022-11-27 DIAGNOSIS — Z79899 Other long term (current) drug therapy: Secondary | ICD-10-CM | POA: Insufficient documentation

## 2022-11-27 DIAGNOSIS — Z59819 Housing instability, housed unspecified: Secondary | ICD-10-CM | POA: Diagnosis present

## 2022-11-27 DIAGNOSIS — F322 Major depressive disorder, single episode, severe without psychotic features: Secondary | ICD-10-CM | POA: Diagnosis present

## 2022-11-27 LAB — URINALYSIS, ROUTINE W REFLEX MICROSCOPIC
Bilirubin Urine: NEGATIVE
Glucose, UA: NEGATIVE mg/dL
Hgb urine dipstick: NEGATIVE
Ketones, ur: NEGATIVE mg/dL
Nitrite: NEGATIVE
Protein, ur: NEGATIVE mg/dL
Specific Gravity, Urine: 1.013 (ref 1.005–1.030)
pH: 5 (ref 5.0–8.0)

## 2022-11-27 LAB — ETHANOL: Alcohol, Ethyl (B): <10 mg/dL (ref <10)

## 2022-11-27 LAB — CBC WITH DIFFERENTIAL/PLATELET
Abs Immature Granulocytes: 0.08 10*3/uL — ABNORMAL HIGH (ref 0.00–0.07)
Basophils Absolute: 0.1 10*3/uL (ref 0.0–0.1)
Basophils Relative: 0 %
Eosinophils Absolute: 0.1 10*3/uL (ref 0.0–0.5)
Eosinophils Relative: 1 %
HCT: 40.4 % (ref 36.0–46.0)
Hemoglobin: 12.5 g/dL (ref 12.0–15.0)
Immature Granulocytes: 1 %
Lymphocytes Relative: 25 %
Lymphs Abs: 2.8 10*3/uL (ref 0.7–4.0)
MCH: 27.3 pg (ref 26.0–34.0)
MCHC: 30.9 g/dL (ref 30.0–36.0)
MCV: 88.2 fL (ref 80.0–100.0)
Monocytes Absolute: 1.3 10*3/uL — ABNORMAL HIGH (ref 0.1–1.0)
Monocytes Relative: 12 %
Neutro Abs: 6.9 10*3/uL (ref 1.7–7.7)
Neutrophils Relative %: 61 %
Platelets: 209 10*3/uL (ref 150–400)
RBC: 4.58 MIL/uL (ref 3.87–5.11)
RDW: 15.4 % (ref 11.5–15.5)
WBC: 11.2 10*3/uL — ABNORMAL HIGH (ref 4.0–10.5)
nRBC: 0 % (ref 0.0–0.2)

## 2022-11-27 LAB — VALPROIC ACID LEVEL: Valproic Acid Lvl: <10 ug/mL — ABNORMAL LOW (ref 50.0–100.0)

## 2022-11-27 LAB — COMPREHENSIVE METABOLIC PANEL
ALT: 18 U/L (ref 0–44)
AST: 22 U/L (ref 15–41)
Albumin: 3.5 g/dL (ref 3.5–5.0)
Alkaline Phosphatase: 76 U/L (ref 38–126)
Anion gap: 10 (ref 5–15)
BUN: 6 mg/dL (ref 6–20)
CO2: 24 mmol/L (ref 22–32)
Calcium: 9.1 mg/dL (ref 8.9–10.3)
Chloride: 106 mmol/L (ref 98–111)
Creatinine, Ser: 0.73 mg/dL (ref 0.44–1.00)
GFR, Estimated: >60 mL/min (ref >60)
Glucose, Bld: 111 mg/dL — ABNORMAL HIGH (ref 70–99)
Potassium: 3.9 mmol/L (ref 3.5–5.1)
Sodium: 140 mmol/L (ref 135–145)
Total Bilirubin: 0.6 mg/dL (ref 0.3–1.2)
Total Protein: 7 g/dL (ref 6.5–8.1)

## 2022-11-27 LAB — RAPID URINE DRUG SCREEN, HOSP PERFORMED
Amphetamines: NOT DETECTED
Barbiturates: NOT DETECTED
Benzodiazepines: NOT DETECTED
Cocaine: POSITIVE — AB
Opiates: NOT DETECTED
Tetrahydrocannabinol: NOT DETECTED

## 2022-11-27 LAB — MAGNESIUM: Magnesium: 1.8 mg/dL (ref 1.7–2.4)

## 2022-11-27 LAB — CBG MONITORING, ED: Glucose-Capillary: 116 mg/dL — ABNORMAL HIGH (ref 70–99)

## 2022-11-27 MED ORDER — LEVETIRACETAM IN NACL 1500 MG/100ML IV SOLN
1500.0000 mg | Freq: Once | INTRAVENOUS | Status: AC
  Administered 2022-11-27: 1500 mg via INTRAVENOUS

## 2022-11-27 MED ORDER — GABAPENTIN 300 MG PO CAPS
300.0000 mg | ORAL_CAPSULE | Freq: Two times a day (BID) | ORAL | Status: DC
  Administered 2022-11-27 – 2022-11-29 (×4): 300 mg via ORAL
  Filled 2022-11-27 (×4): qty 1

## 2022-11-27 MED ORDER — POLYETHYLENE GLYCOL 3350 17 G PO PACK: 17.0000 g | PACK | Freq: Every day | ORAL | Status: DC | PRN

## 2022-11-27 MED ORDER — THIAMINE HCL 100 MG/ML IJ SOLN
100.0000 mg | Freq: Every day | INTRAMUSCULAR | Status: DC
  Administered 2022-11-27 – 2022-11-28 (×2): 100 mg via INTRAVENOUS
  Filled 2022-11-27 (×2): qty 2

## 2022-11-27 MED ORDER — ACETAMINOPHEN 325 MG PO TABS
650.0000 mg | ORAL_TABLET | Freq: Four times a day (QID) | ORAL | Status: DC | PRN
  Administered 2022-11-27 – 2022-11-28 (×3): 650 mg via ORAL
  Filled 2022-11-27 (×3): qty 2

## 2022-11-27 MED ORDER — ACETAMINOPHEN 650 MG RE SUPP: 650.0000 mg | Freq: Four times a day (QID) | RECTAL | Status: DC | PRN

## 2022-11-27 MED ORDER — ENOXAPARIN SODIUM 40 MG/0.4ML IJ SOSY
40.0000 mg | PREFILLED_SYRINGE | Freq: Every day | INTRAMUSCULAR | Status: DC
  Administered 2022-11-27 – 2022-11-28 (×2): 40 mg via SUBCUTANEOUS
  Filled 2022-11-27 (×2): qty 0.4

## 2022-11-27 MED ORDER — DIVALPROEX SODIUM 500 MG PO DR TAB: 500.0000 mg | DELAYED_RELEASE_TABLET | Freq: Two times a day (BID) | ORAL | Status: DC

## 2022-11-27 MED ORDER — DIVALPROEX SODIUM 250 MG PO DR TAB
500.0000 mg | DELAYED_RELEASE_TABLET | Freq: Two times a day (BID) | ORAL | Status: DC
  Administered 2022-11-27 – 2022-11-29 (×4): 500 mg via ORAL
  Filled 2022-11-27 (×4): qty 2

## 2022-11-27 MED ORDER — VALPROATE SODIUM 100 MG/ML IV SOLN
1500.0000 mg | Freq: Once | INTRAVENOUS | Status: AC
  Administered 2022-11-27: 1500 mg via INTRAVENOUS
  Filled 2022-11-27: qty 15

## 2022-11-27 MED ORDER — ADULT MULTIVITAMIN W/MINERALS CH
1.0000 | ORAL_TABLET | Freq: Every day | ORAL | Status: DC
  Administered 2022-11-28 – 2022-11-29 (×2): 1 via ORAL
  Filled 2022-11-27 (×2): qty 1

## 2022-11-27 MED ORDER — TRAZODONE HCL 50 MG PO TABS: 50.0000 mg | ORAL_TABLET | Freq: Every evening | ORAL | Status: DC | PRN

## 2022-11-27 MED ORDER — ORAL CARE MOUTH RINSE
15.0000 mL | OROMUCOSAL | Status: DC
  Administered 2022-11-27 – 2022-11-28 (×7): 15 mL via OROMUCOSAL

## 2022-11-27 MED ORDER — OLANZAPINE 5 MG PO TBDP
5.0000 mg | ORAL_TABLET | Freq: Every day | ORAL | Status: DC
  Administered 2022-11-27 – 2022-11-28 (×2): 5 mg via ORAL
  Filled 2022-11-27 (×2): qty 1

## 2022-11-27 MED ORDER — SERTRALINE HCL 50 MG PO TABS
50.0000 mg | ORAL_TABLET | Freq: Every day | ORAL | Status: DC
  Administered 2022-11-27 – 2022-11-29 (×3): 50 mg via ORAL
  Filled 2022-11-27 (×3): qty 1

## 2022-11-27 MED ORDER — SODIUM CHLORIDE 0.9 % IV SOLN: Freq: Once | INTRAVENOUS | Status: DC

## 2022-11-27 MED ORDER — FOLIC ACID 5 MG/ML IJ SOLN
1.0000 mg | Freq: Every day | INTRAMUSCULAR | Status: DC
  Administered 2022-11-27 – 2022-11-28 (×2): 1 mg via INTRAVENOUS
  Filled 2022-11-27 (×4): qty 0.2

## 2022-11-27 MED ORDER — SODIUM CHLORIDE 0.9 % IV BOLUS
1000.0000 mL | Freq: Once | INTRAVENOUS | Status: AC
  Administered 2022-11-27: 1000 mL via INTRAVENOUS

## 2022-11-27 MED ORDER — LORAZEPAM 2 MG/ML IJ SOLN
2.0000 mg | Freq: Once | INTRAMUSCULAR | Status: AC
  Administered 2022-11-27: 2 mg via INTRAVENOUS
  Filled 2022-11-27: qty 1

## 2022-11-27 MED ORDER — CHLORHEXIDINE GLUCONATE 0.12% ORAL RINSE (MEDLINE KIT)
15.0000 mL | Freq: Two times a day (BID) | OROMUCOSAL | Status: DC
  Administered 2022-11-28 – 2022-11-29 (×2): 15 mL via OROMUCOSAL

## 2022-11-27 NOTE — ED Notes (Signed)
RN voiced concerns that pt is decompensating, MD at bedside placed pt on NRB.

## 2022-11-27 NOTE — ED Notes (Signed)
Pt was being resistant when nt tried to get vitals , pt stated she wants to be left alone , nt was still able to get vitals

## 2022-11-27 NOTE — ED Triage Notes (Addendum)
Pt BIB EMS due to a seizure. Pt is from [facility]. Pt has a witnessed seizure last night and today that lasted 2 min. Pt is axox3. Pt has hx of seizures. VSS. Pt has hematoma on left eyebrow.

## 2022-11-27 NOTE — ED Notes (Signed)
ED TO INPATIENT HANDOFF REPORT  ED Nurse Name and Phone #: Richardson Landry [phone]  S Name/Age/Gender Theresa Rios 57 y.o. female Room/Bed: 046C/046C  Code Status   Code Status: Full Code  Home/SNF/Other Homeless Patient oriented to: self, place, time, and situation Is this baseline? Yes   Triage Complete: Triage complete  Chief Complaint Seizure [facility]) [R56.9]  Triage Note Pt BIB EMS due to a seizure. Pt is from [facility]. Pt has a witnessed seizure last night and today that lasted 2 min. Pt is axox3. Pt has hx of seizures. VSS. Pt has hematoma on left eyebrow.    Allergies No Known Allergies  Level of Care/Admitting Diagnosis ED Disposition     ED Disposition  Admit   Condition  --   Comment  Hospital Area: [facility] [[id number]]  Level of Care: Telemetry Medical [104]  May place patient in observation at [facility] or [place] if equivalent level of care is available:: No  Covid Evaluation: Recent COVID positive no isolation required infection day 21-90  Diagnosis: Seizure [facility]) [id number]  Admitting Physician: Velna Ochs [id number]  Attending Physician: Velna Ochs [id number]          B Medical/Surgery History Past Medical History:  Diagnosis Date   Seizures ([place])    No past surgical history on file.   A IV Location/Drains/Wounds Patient Lines/Drains/Airways Status     Active Line/Drains/Airways     Name Placement date Placement time Site Days   Peripheral IV 11/27/22 20 G Anterior;Left;Proximal Forearm 11/27/22  0831  Forearm  less than 1            Intake/Output Last 24 hours No intake or output data in the 24 hours ending 11/27/22 1350  Labs/Imaging Results for orders placed or performed during the hospital encounter of 11/27/22 (from the past 48 hour(s))  Valproic acid level     Status: Abnormal   Collection Time: 11/27/22  8:37 AM  Result Value Ref Range   Valproic Acid Lvl <10 (L) 50.0 - 100.0 ug/mL    Comment:  RESULT CONFIRMED BY MANUAL DILUTION Performed at [facility], [address] [place], [place] [address]   Comprehensive metabolic panel     Status: Abnormal   Collection Time: 11/27/22  8:37 AM  Result Value Ref Range   Sodium 140 135 - 145 mmol/L   Potassium 3.9 3.5 - 5.1 mmol/L   Chloride 106 98 - 111 mmol/L   CO2 24 22 - 32 mmol/L   Glucose, Bld 111 (H) 70 - 99 mg/dL    Comment: Glucose reference range applies only to samples taken after fasting for at least 8 hours.   BUN 6 6 - 20 mg/dL   Creatinine, Ser 0.73 0.44 - 1.00 mg/dL   Calcium 9.1 8.9 - 10.3 mg/dL   Total Protein 7.0 6.5 - 8.1 g/dL   Albumin 3.5 3.5 - 5.0 g/dL   AST 22 15 - 41 U/L   ALT 18 0 - 44 U/L   Alkaline Phosphatase 76 38 - 126 U/L   Total Bilirubin 0.6 0.3 - 1.2 mg/dL   GFR, Estimated >60 >60 mL/min    Comment: (NOTE) Calculated using the CKD-EPI Creatinine Equation (2021)    Anion gap 10 5 - 15    Comment: Performed at [facility] [address], [place],  [address]  CBC with Differential     Status: Abnormal   Collection Time: 11/27/22  8:37 AM  Result  Value Ref Range   WBC 11.2 (H) 4.0 - 10.5 K/uL   RBC 4.58 3.87 - 5.11 MIL/uL   Hemoglobin 12.5 12.0 - 15.0 g/dL   HCT 40.4 36.0 - 46.0 %   MCV 88.2 80.0 - 100.0 fL   MCH 27.3 26.0 - 34.0 pg   MCHC 30.9 30.0 - 36.0 g/dL   RDW 15.4 11.5 - 15.5 %   Platelets 209 150 - 400 K/uL   nRBC 0.0 0.0 - 0.2 %   Neutrophils Relative % 61 %   Neutro Abs 6.9 1.7 - 7.7 K/uL   Lymphocytes Relative 25 %   Lymphs Abs 2.8 0.7 - 4.0 K/uL   Monocytes Relative 12 %   Monocytes Absolute 1.3 (H) 0.1 - 1.0 K/uL   Eosinophils Relative 1 %   Eosinophils Absolute 0.1 0.0 - 0.5 K/uL   Basophils Relative 0 %   Basophils Absolute 0.1 0.0 - 0.1 K/uL   Immature Granulocytes 1 %   Abs Immature Granulocytes 0.08 (H) 0.00 - 0.07 K/uL    Comment: Performed at [facility], [address], [place], [place] [address]  Ethanol     Status: None    Collection Time: 11/27/22  8:37 AM  Result Value Ref Range   Alcohol, Ethyl (B) <10 <10 mg/dL    Comment: (NOTE) Lowest detectable limit for serum alcohol is 10 mg/dL.  For medical purposes only. Performed at [facility], [place] [address], [place], [place] [address]   CBG monitoring, ED     Status: Abnormal   Collection Time: 11/27/22  8:38 AM  Result Value Ref Range   Glucose-Capillary 116 (H) 70 - 99 mg/dL    Comment: Glucose reference range applies only to samples taken after fasting for at least 8 hours.  Urinalysis, Routine w reflex microscopic -Urine, Clean Catch     Status: Abnormal   Collection Time: 11/27/22 10:53 AM  Result Value Ref Range   Color, Urine YELLOW YELLOW   APPearance CLEAR CLEAR   Specific Gravity, Urine 1.013 1.005 - 1.030   pH 5.0 5.0 - 8.0   Glucose, UA NEGATIVE NEGATIVE mg/dL   Hgb urine dipstick NEGATIVE NEGATIVE   Bilirubin Urine NEGATIVE NEGATIVE   Ketones, ur NEGATIVE NEGATIVE mg/dL   Protein, ur NEGATIVE NEGATIVE mg/dL   Nitrite NEGATIVE NEGATIVE   Leukocytes,Ua MODERATE (A) NEGATIVE   RBC / HPF 0-5 0 - 5 RBC/hpf   WBC, UA 0-5 0 - 5 WBC/hpf   Bacteria, UA RARE (A) NONE SEEN   Squamous Epithelial / HPF 0-5 0 - 5 /HPF    Comment: Performed at [facility], [address], [place], [place] [address]   EEG adult  Result Date: 11/27/2022 Lora Havens, MD     11/27/2022 12:11 PM Patient Name: Theresa Rios MRN: [id number] Epilepsy Attending: Lora Havens Referring Physician/Provider: Amie Portland, MD Date: 11/27/2022 Duration: 29.15 mins Patient history: 57 year old with a significant past medical history of psychiatric illness including MDD, prior suicide attempts, polysubstance abuse and alcohol abuse, seizures presenting for evaluation of multiple seizures since last night. EEG to evaluate for seizure Level of alertness: Awake, asleep AEDs during EEG study: LEV, VPA, Ativan Technical aspects: This EEG study was done with scalp  electrodes positioned according to the 10-20 International system of electrode placement. Electrical activity was reviewed with band pass filter of 1-70Hz , sensitivity of 7 uV/mm, display speed of 79mm/sec with a 60Hz  notched filter applied as appropriate.  EEG data were recorded continuously and digitally stored.  Video monitoring was available and reviewed as appropriate. Description: No posterior dominant rhythm was seen. Sleep was characterized by vertex waves, sleep spindles (12 to 14 Hz), maximal frontocentral region. There is an excessive amount of 13 to 13 Hz beta activity distributed symmetrically and diffusely.  Hyperventilation and photic stimulation were not performed.    ABNORMALITY - Excessive beta, generalized  IMPRESSION: This study is within normal limits. The excessive beta activity seen in the background is most likely due to the effect of benzodiazepine and is a benign EEG pattern. No seizures or epileptiform discharges were seen throughout the recording.  A normal interictal EEG does not exclude the diagnosis of epilepsy.  Lora Havens   DG Chest Port 1 View  Result Date: 11/27/2022 CLINICAL DATA:  Concern for aspiration. EXAM: PORTABLE CHEST 1 VIEW COMPARISON:  Chest radiograph dated September 27, 2022. FINDINGS: The heart is mildly enlarged. Bilateral lower lobe hazy and reticular opacities, concerning for airspace disease, differential includes pulmonary edema or aspiration pneumonia. No pleural effusion or focal consolidation. No acute osseous abnormality. IMPRESSION: Bilateral lower lobe hazy and reticular opacities, concerning for airspace disease, differential includes pulmonary edema or aspiration pneumonia. Electronically Signed   By: Keane Police D.O.   On: 11/27/2022 11:00   CT Head Wo Contrast  Result Date: 11/27/2022 CLINICAL DATA:  Blunt facial trauma.  Seizures. EXAM: CT HEAD WITHOUT CONTRAST CT MAXILLOFACIAL WITHOUT CONTRAST CT CERVICAL SPINE WITHOUT CONTRAST TECHNIQUE:  Multidetector CT imaging of the head, cervical spine, and maxillofacial structures were performed using the standard protocol without intravenous contrast. Multiplanar CT image reconstructions of the cervical spine and maxillofacial structures were also generated. RADIATION DOSE REDUCTION: This exam was performed according to the departmental dose-optimization program which includes automated exposure control, adjustment of the mA and/or kV according to patient size and/or use of iterative reconstruction technique. COMPARISON:  09/27/2022 FINDINGS: CT HEAD FINDINGS Brain: Brain has normal appearance without evidence of atrophy, stroke, mass, hemorrhage, hydrocephalus or extra-axial collection. Vascular: No abnormal vascular finding. Skull: No skull fracture. Other: None CT MAXILLOFACIAL FINDINGS Osseous: No acute facial fracture. Old/chronic/healed medial orbital blowout fracture on the right. Chronic in word bowing of the zygomatic arch on the left. Chronic dental decay. Orbits: No soft tissue orbital injury. Old medial blowout fracture on the right as noted above. Sinuses: No traumatic finding. Chronic inflammatory changes of the maxillary sinuses, right more than left. Small amount of layering fluid. Soft tissues: No significant soft tissue injury identified by CT. Bilateral tonsillar prominence, not primarily evaluated. CT CERVICAL SPINE FINDINGS Alignment: Straightening of the normal cervical lordosis Skull base and vertebrae: No regional fracture. Soft tissues and spinal canal: No traumatic soft tissue finding. Disc levels: The foramen magnum is widely patent. There is ordinary mild osteoarthritis of the C1-2 articulation but no encroachment upon the neural structures. Chronic degenerative spondylosis at C3-4, C4-5, C5-6 and C6-7. No apparent compressive bony canal or foraminal narrowing however. Chronic facet osteoarthritis at the C7-T1 level on the right. Upper chest: Negative Other: None IMPRESSION: HEAD  CT: Normal. MAXILLOFACIAL CT: No acute facial fracture. Old/chronic/healed medial orbital blowout fracture on the right. Chronic bowing of the zygomatic arch on the left. CERVICAL SPINE CT: No acute or traumatic finding. Chronic degenerative changes as outlined above. Electronically Signed   By: Nelson Chimes M.D.   On: 11/27/2022 09:51   CT Cervical Spine Wo Contrast  Result Date: 11/27/2022 CLINICAL DATA:  Blunt facial  trauma.  Seizures. EXAM: CT HEAD WITHOUT CONTRAST CT MAXILLOFACIAL WITHOUT CONTRAST CT CERVICAL SPINE WITHOUT CONTRAST TECHNIQUE: Multidetector CT imaging of the head, cervical spine, and maxillofacial structures were performed using the standard protocol without intravenous contrast. Multiplanar CT image reconstructions of the cervical spine and maxillofacial structures were also generated. RADIATION DOSE REDUCTION: This exam was performed according to the departmental dose-optimization program which includes automated exposure control, adjustment of the mA and/or kV according to patient size and/or use of iterative reconstruction technique. COMPARISON:  09/27/2022 FINDINGS: CT HEAD FINDINGS Brain: Brain has normal appearance without evidence of atrophy, stroke, mass, hemorrhage, hydrocephalus or extra-axial collection. Vascular: No abnormal vascular finding. Skull: No skull fracture. Other: None CT MAXILLOFACIAL FINDINGS Osseous: No acute facial fracture. Old/chronic/healed medial orbital blowout fracture on the right. Chronic in word bowing of the zygomatic arch on the left. Chronic dental decay. Orbits: No soft tissue orbital injury. Old medial blowout fracture on the right as noted above. Sinuses: No traumatic finding. Chronic inflammatory changes of the maxillary sinuses, right more than left. Small amount of layering fluid. Soft tissues: No significant soft tissue injury identified by CT. Bilateral tonsillar prominence, not primarily evaluated. CT CERVICAL SPINE FINDINGS Alignment:  Straightening of the normal cervical lordosis Skull base and vertebrae: No regional fracture. Soft tissues and spinal canal: No traumatic soft tissue finding. Disc levels: The foramen magnum is widely patent. There is ordinary mild osteoarthritis of the C1-2 articulation but no encroachment upon the neural structures. Chronic degenerative spondylosis at C3-4, C4-5, C5-6 and C6-7. No apparent compressive bony canal or foraminal narrowing however. Chronic facet osteoarthritis at the C7-T1 level on the right. Upper chest: Negative Other: None IMPRESSION: HEAD CT: Normal. MAXILLOFACIAL CT: No acute facial fracture. Old/chronic/healed medial orbital blowout fracture on the right. Chronic bowing of the zygomatic arch on the left. CERVICAL SPINE CT: No acute or traumatic finding. Chronic degenerative changes as outlined above. Electronically Signed   By: Nelson Chimes M.D.   On: 11/27/2022 09:51   CT Maxillofacial WO CM  Result Date: 11/27/2022 CLINICAL DATA:  Blunt facial trauma.  Seizures. EXAM: CT HEAD WITHOUT CONTRAST CT MAXILLOFACIAL WITHOUT CONTRAST CT CERVICAL SPINE WITHOUT CONTRAST TECHNIQUE: Multidetector CT imaging of the head, cervical spine, and maxillofacial structures were performed using the standard protocol without intravenous contrast. Multiplanar CT image reconstructions of the cervical spine and maxillofacial structures were also generated. RADIATION DOSE REDUCTION: This exam was performed according to the departmental dose-optimization program which includes automated exposure control, adjustment of the mA and/or kV according to patient size and/or use of iterative reconstruction technique. COMPARISON:  09/27/2022 FINDINGS: CT HEAD FINDINGS Brain: Brain has normal appearance without evidence of atrophy, stroke, mass, hemorrhage, hydrocephalus or extra-axial collection. Vascular: No abnormal vascular finding. Skull: No skull fracture. Other: None CT MAXILLOFACIAL FINDINGS Osseous: No acute facial  fracture. Old/chronic/healed medial orbital blowout fracture on the right. Chronic in word bowing of the zygomatic arch on the left. Chronic dental decay. Orbits: No soft tissue orbital injury. Old medial blowout fracture on the right as noted above. Sinuses: No traumatic finding. Chronic inflammatory changes of the maxillary sinuses, right more than left. Small amount of layering fluid. Soft tissues: No significant soft tissue injury identified by CT. Bilateral tonsillar prominence, not primarily evaluated. CT CERVICAL SPINE FINDINGS Alignment: Straightening of the normal cervical lordosis Skull base and vertebrae: No regional fracture. Soft tissues and spinal canal: No traumatic soft tissue finding. Disc levels: The foramen magnum is widely patent. There is ordinary  mild osteoarthritis of the C1-2 articulation but no encroachment upon the neural structures. Chronic degenerative spondylosis at C3-4, C4-5, C5-6 and C6-7. No apparent compressive bony canal or foraminal narrowing however. Chronic facet osteoarthritis at the C7-T1 level on the right. Upper chest: Negative Other: None IMPRESSION: HEAD CT: Normal. MAXILLOFACIAL CT: No acute facial fracture. Old/chronic/healed medial orbital blowout fracture on the right. Chronic bowing of the zygomatic arch on the left. CERVICAL SPINE CT: No acute or traumatic finding. Chronic degenerative changes as outlined above. Electronically Signed   By: Nelson Chimes M.D.   On: 11/27/2022 09:51    Pending Labs Unresulted Labs (From admission, onward)     Start     Ordered   11/28/22 [id number]  Basic metabolic panel  Tomorrow morning,   R        11/27/22 1139   11/28/22 0500  CBC with Differential/Platelet  Tomorrow morning,   R        11/27/22 1139   11/27/22 1033  Rapid urine drug screen (hospital performed)  ONCE - STAT,   STAT        11/27/22 1032            Vitals/Pain Today's Vitals   11/27/22 1015 11/27/22 1226 11/27/22 1317 11/27/22 1330  BP: (!) 167/110 (!)  166/83 (!) 143/114   Pulse: 85 92 91 88  Resp: (!) 25 18 19 20   Temp:  98.5 F (36.9 C)    TempSrc:  Axillary    SpO2: 92% 96% 96% 96%    Isolation Precautions No active isolations  Medications Medications  chlorhexidine gluconate (MEDLINE KIT) (PERIDEX) 0.12 % solution 15 mL (has no administration in time range)  Oral care mouth rinse (has no administration in time range)  enoxaparin (LOVENOX) injection 40 mg (40 mg Subcutaneous Given 11/27/22 1320)  acetaminophen (TYLENOL) tablet 650 mg (has no administration in time range)    Or  acetaminophen (TYLENOL) suppository 650 mg (has no administration in time range)  polyethylene glycol (MIRALAX / GLYCOLAX) packet 17 g (has no administration in time range)  folic acid injection 1 mg (has no administration in time range)  thiamine (VITAMIN B1) injection 100 mg (100 mg Intravenous Given 11/27/22 1320)  multivitamin with minerals tablet 1 tablet (1 tablet Oral Not Given 11/27/22 1152)  sodium chloride 0.9 % bolus 1,000 mL (0 mLs Intravenous Stopped 11/27/22 1031)  LORazepam (ATIVAN) injection 2 mg (2 mg Intravenous Given 11/27/22 1023)  levETIRAcetam (KEPPRA) IVPB 1500 mg/ 100 mL premix (0 mg Intravenous Stopped 11/27/22 1045)  levETIRAcetam (KEPPRA) IVPB 1500 mg/ 100 mL premix (0 mg Intravenous Stopped 11/27/22 1059)    Mobility walks     Focused Assessments Neuro Assessment Handoff:  Swallow screen pass?  N/a         Neuro Assessment: Exceptions to WDL (patient lethargic, falls asleep during assessment) Neuro Checks:      Has TPA been given? No If patient is a Neuro Trauma and patient is going to OR before floor call report to [place] nurse: [phone] or [phone]   R Recommendations: See Admitting Provider Note  Report given to:   Additional Notes: Patient wakes with stimulation, falls back asleep during assessment. Uses CPAP when sleeping normally.

## 2022-11-27 NOTE — Progress Notes (Signed)
EEG complete - results pending 

## 2022-11-27 NOTE — Plan of Care (Signed)

## 2022-11-27 NOTE — ED Notes (Signed)
EEG at bedside.

## 2022-11-27 NOTE — ED Notes (Signed)
Fall risk bracelet placed on patient, fall risk magnet placed outside patients room.

## 2022-11-27 NOTE — ED Provider Notes (Addendum)
[place] Provider Note   CSN: [id number] Arrival date & time: 11/27/22  [id number]     History  Chief Complaint  Patient presents with   Seizures    Theresa Rios is a 57 y.o. female.  With past medical history of MDD, housing instability, alcohol use disorder, seizures who presents to the emergency department with seizure.  Level 5 Caveat: AMS   Patient presents via EMS from [facility] due to a seizure.  Patient had witnessed seizure last night and this morning that lasted about 2 minutes.  Patient has history of seizures and reportedly on Depakote.  She also has a history of alcohol use but per previous notes has not had alcohol withdrawal seizure.  On questioning patient was incomprehensible speech.   Seizures      Home Medications Prior to Admission medications   Medication Sig Start Date End Date Taking? Authorizing Provider  divalproex (DEPAKOTE) 500 MG DR tablet Take 1 tablet (500 mg total) by mouth every 12 (twelve) hours. 09/28/22   Nani Gasser, MD  gabapentin (NEURONTIN) 300 MG capsule Take 1 capsule (300 mg total) by mouth 2 (two) times daily. 09/28/22   Nani Gasser, MD  nicotine polacrilex (NICORETTE) 2 MG gum Take 1 each (2 mg total) by mouth as needed for smoking cessation. Patient not taking: Reported on 09/28/2022 09/06/22   Nicholes Rough, NP  OLANZapine zydis (ZYPREXA) 5 MG disintegrating tablet Take 1 tablet (5 mg total) by mouth at bedtime. 09/06/22   Nicholes Rough, NP  sertraline (ZOLOFT) 50 MG tablet Take 1 tablet (50 mg total) by mouth daily. 09/07/22   Nicholes Rough, NP  traZODone (DESYREL) 50 MG tablet Take 1 tablet (50 mg total) by mouth at bedtime as needed for sleep. 09/06/22   Nicholes Rough, NP      Allergies    Patient has no known allergies.    Review of Systems   Review of Systems  Unable to perform ROS: Mental status change  Neurological:  Positive for seizures.    Physical Exam Updated  Vital Signs BP (!) 167/110   Pulse 85   Temp 97.6 F (36.4 C) (Oral)   Resp (!) 25   LMP 05/17/2014   SpO2 92%  Physical Exam Vitals and nursing note reviewed.  Constitutional:      Appearance: She is ill-appearing. She is not toxic-appearing.     Comments: Somnolent  HENT:     Head: Normocephalic.     Comments: Bruising to the left brow and just under the left eye    Mouth/Throat:     Mouth: Mucous membranes are dry.     Pharynx: Oropharynx is clear.  Eyes:     General: No scleral icterus.    Extraocular Movements: Extraocular movements intact.     Conjunctiva/sclera:     Right eye: Right conjunctiva is injected.     Left eye: Left conjunctiva is injected.     Pupils: Pupils are equal, round, and reactive to light.  Cardiovascular:     Rate and Rhythm: Normal rate and regular rhythm.     Pulses:          Radial pulses are 2+ on the right side and 2+ on the left side.     Heart sounds: Normal heart sounds.  Pulmonary:     Effort: Pulmonary effort is normal. No tachypnea or respiratory distress.     Breath sounds: Normal breath sounds.  Abdominal:  General: Bowel sounds are normal.     Palpations: Abdomen is soft.  Skin:    General: Skin is warm and dry.     Capillary Refill: Capillary refill takes less than 2 seconds.  Neurological:     General: No focal deficit present.     Mental Status: She is alert.     GCS: GCS eye subscore is 3. GCS verbal subscore is 2. GCS motor subscore is 6.     Motor: Motor function is intact.     Comments: Moves all extremities equally  Psychiatric:        Mood and Affect: Mood normal.        Behavior: Behavior normal.     ED Results / Procedures / Treatments   Labs (all labs ordered are listed, but only abnormal results are displayed) Labs Reviewed  VALPROIC ACID LEVEL - Abnormal; Notable for the following components:      Result Value   Valproic Acid Lvl <10 (*)    All other components within normal limits  COMPREHENSIVE  METABOLIC PANEL - Abnormal; Notable for the following components:   Glucose, Bld 111 (*)    All other components within normal limits  CBC WITH DIFFERENTIAL/PLATELET - Abnormal; Notable for the following components:   WBC 11.2 (*)    Monocytes Absolute 1.3 (*)    Abs Immature Granulocytes 0.08 (*)    All other components within normal limits  URINALYSIS, ROUTINE W REFLEX MICROSCOPIC - Abnormal; Notable for the following components:   Leukocytes,Ua MODERATE (*)    Bacteria, UA RARE (*)    All other components within normal limits  CBG MONITORING, ED - Abnormal; Notable for the following components:   Glucose-Capillary 116 (*)    All other components within normal limits  ETHANOL  RAPID URINE DRUG SCREEN, HOSP PERFORMED    EKG None  Radiology DG Chest Port 1 View  Result Date: 11/27/2022 CLINICAL DATA:  Concern for aspiration. EXAM: PORTABLE CHEST 1 VIEW COMPARISON:  Chest radiograph dated September 27, 2022. FINDINGS: The heart is mildly enlarged. Bilateral lower lobe hazy and reticular opacities, concerning for airspace disease, differential includes pulmonary edema or aspiration pneumonia. No pleural effusion or focal consolidation. No acute osseous abnormality. IMPRESSION: Bilateral lower lobe hazy and reticular opacities, concerning for airspace disease, differential includes pulmonary edema or aspiration pneumonia. Electronically Signed   By: Keane Police D.O.   On: 11/27/2022 11:00   CT Head Wo Contrast  Result Date: 11/27/2022 CLINICAL DATA:  Blunt facial trauma.  Seizures. EXAM: CT HEAD WITHOUT CONTRAST CT MAXILLOFACIAL WITHOUT CONTRAST CT CERVICAL SPINE WITHOUT CONTRAST TECHNIQUE: Multidetector CT imaging of the head, cervical spine, and maxillofacial structures were performed using the standard protocol without intravenous contrast. Multiplanar CT image reconstructions of the cervical spine and maxillofacial structures were also generated. RADIATION DOSE REDUCTION: This exam was  performed according to the departmental dose-optimization program which includes automated exposure control, adjustment of the mA and/or kV according to patient size and/or use of iterative reconstruction technique. COMPARISON:  09/27/2022 FINDINGS: CT HEAD FINDINGS Brain: Brain has normal appearance without evidence of atrophy, stroke, mass, hemorrhage, hydrocephalus or extra-axial collection. Vascular: No abnormal vascular finding. Skull: No skull fracture. Other: None CT MAXILLOFACIAL FINDINGS Osseous: No acute facial fracture. Old/chronic/healed medial orbital blowout fracture on the right. Chronic in word bowing of the zygomatic arch on the left. Chronic dental decay. Orbits: No soft tissue orbital injury. Old medial blowout fracture on the right as noted above. Sinuses: No  traumatic finding. Chronic inflammatory changes of the maxillary sinuses, right more than left. Small amount of layering fluid. Soft tissues: No significant soft tissue injury identified by CT. Bilateral tonsillar prominence, not primarily evaluated. CT CERVICAL SPINE FINDINGS Alignment: Straightening of the normal cervical lordosis Skull base and vertebrae: No regional fracture. Soft tissues and spinal canal: No traumatic soft tissue finding. Disc levels: The foramen magnum is widely patent. There is ordinary mild osteoarthritis of the C1-2 articulation but no encroachment upon the neural structures. Chronic degenerative spondylosis at C3-4, C4-5, C5-6 and C6-7. No apparent compressive bony canal or foraminal narrowing however. Chronic facet osteoarthritis at the C7-T1 level on the right. Upper chest: Negative Other: None IMPRESSION: HEAD CT: Normal. MAXILLOFACIAL CT: No acute facial fracture. Old/chronic/healed medial orbital blowout fracture on the right. Chronic bowing of the zygomatic arch on the left. CERVICAL SPINE CT: No acute or traumatic finding. Chronic degenerative changes as outlined above. Electronically Signed   By: Nelson Chimes M.D.   On: 11/27/2022 09:51   CT Cervical Spine Wo Contrast  Result Date: 11/27/2022 CLINICAL DATA:  Blunt facial trauma.  Seizures. EXAM: CT HEAD WITHOUT CONTRAST CT MAXILLOFACIAL WITHOUT CONTRAST CT CERVICAL SPINE WITHOUT CONTRAST TECHNIQUE: Multidetector CT imaging of the head, cervical spine, and maxillofacial structures were performed using the standard protocol without intravenous contrast. Multiplanar CT image reconstructions of the cervical spine and maxillofacial structures were also generated. RADIATION DOSE REDUCTION: This exam was performed according to the departmental dose-optimization program which includes automated exposure control, adjustment of the mA and/or kV according to patient size and/or use of iterative reconstruction technique. COMPARISON:  09/27/2022 FINDINGS: CT HEAD FINDINGS Brain: Brain has normal appearance without evidence of atrophy, stroke, mass, hemorrhage, hydrocephalus or extra-axial collection. Vascular: No abnormal vascular finding. Skull: No skull fracture. Other: None CT MAXILLOFACIAL FINDINGS Osseous: No acute facial fracture. Old/chronic/healed medial orbital blowout fracture on the right. Chronic in word bowing of the zygomatic arch on the left. Chronic dental decay. Orbits: No soft tissue orbital injury. Old medial blowout fracture on the right as noted above. Sinuses: No traumatic finding. Chronic inflammatory changes of the maxillary sinuses, right more than left. Small amount of layering fluid. Soft tissues: No significant soft tissue injury identified by CT. Bilateral tonsillar prominence, not primarily evaluated. CT CERVICAL SPINE FINDINGS Alignment: Straightening of the normal cervical lordosis Skull base and vertebrae: No regional fracture. Soft tissues and spinal canal: No traumatic soft tissue finding. Disc levels: The foramen magnum is widely patent. There is ordinary mild osteoarthritis of the C1-2 articulation but no encroachment upon the neural  structures. Chronic degenerative spondylosis at C3-4, C4-5, C5-6 and C6-7. No apparent compressive bony canal or foraminal narrowing however. Chronic facet osteoarthritis at the C7-T1 level on the right. Upper chest: Negative Other: None IMPRESSION: HEAD CT: Normal. MAXILLOFACIAL CT: No acute facial fracture. Old/chronic/healed medial orbital blowout fracture on the right. Chronic bowing of the zygomatic arch on the left. CERVICAL SPINE CT: No acute or traumatic finding. Chronic degenerative changes as outlined above. Electronically Signed   By: Nelson Chimes M.D.   On: 11/27/2022 09:51   CT Maxillofacial WO CM  Result Date: 11/27/2022 CLINICAL DATA:  Blunt facial trauma.  Seizures. EXAM: CT HEAD WITHOUT CONTRAST CT MAXILLOFACIAL WITHOUT CONTRAST CT CERVICAL SPINE WITHOUT CONTRAST TECHNIQUE: Multidetector CT imaging of the head, cervical spine, and maxillofacial structures were performed using the standard protocol without intravenous contrast. Multiplanar CT image reconstructions of the cervical spine and maxillofacial structures were also  generated. RADIATION DOSE REDUCTION: This exam was performed according to the departmental dose-optimization program which includes automated exposure control, adjustment of the mA and/or kV according to patient size and/or use of iterative reconstruction technique. COMPARISON:  09/27/2022 FINDINGS: CT HEAD FINDINGS Brain: Brain has normal appearance without evidence of atrophy, stroke, mass, hemorrhage, hydrocephalus or extra-axial collection. Vascular: No abnormal vascular finding. Skull: No skull fracture. Other: None CT MAXILLOFACIAL FINDINGS Osseous: No acute facial fracture. Old/chronic/healed medial orbital blowout fracture on the right. Chronic in word bowing of the zygomatic arch on the left. Chronic dental decay. Orbits: No soft tissue orbital injury. Old medial blowout fracture on the right as noted above. Sinuses: No traumatic finding. Chronic inflammatory changes  of the maxillary sinuses, right more than left. Small amount of layering fluid. Soft tissues: No significant soft tissue injury identified by CT. Bilateral tonsillar prominence, not primarily evaluated. CT CERVICAL SPINE FINDINGS Alignment: Straightening of the normal cervical lordosis Skull base and vertebrae: No regional fracture. Soft tissues and spinal canal: No traumatic soft tissue finding. Disc levels: The foramen magnum is widely patent. There is ordinary mild osteoarthritis of the C1-2 articulation but no encroachment upon the neural structures. Chronic degenerative spondylosis at C3-4, C4-5, C5-6 and C6-7. No apparent compressive bony canal or foraminal narrowing however. Chronic facet osteoarthritis at the C7-T1 level on the right. Upper chest: Negative Other: None IMPRESSION: HEAD CT: Normal. MAXILLOFACIAL CT: No acute facial fracture. Old/chronic/healed medial orbital blowout fracture on the right. Chronic bowing of the zygomatic arch on the left. CERVICAL SPINE CT: No acute or traumatic finding. Chronic degenerative changes as outlined above. Electronically Signed   By: Nelson Chimes M.D.   On: 11/27/2022 09:51    Procedures .Critical Care  Performed by: Mickie Hillier, PA-C Authorized by: Mickie Hillier, PA-C   Critical care provider statement:    Critical care time (minutes):  35   Critical care time was exclusive of:  Separately billable procedures and treating other patients   Critical care was necessary to treat or prevent imminent or life-threatening deterioration of the following conditions:  CNS failure or compromise and respiratory failure   Critical care was time spent personally by me on the following activities:  Discussions with consultants, discussions with primary provider, evaluation of patient's response to treatment, examination of patient, interpretation of cardiac output measurements, obtaining history from patient or surrogate, review of old charts, re-evaluation of  patient's condition, ordering and review of radiographic studies, pulse oximetry, ordering and review of laboratory studies, ordering and performing treatments and interventions and development of treatment plan with patient or surrogate   I assumed direction of critical care for this patient from another provider in my specialty: no     Care discussed with: admitting provider      Medications Ordered in ED Medications  sodium chloride 0.9 % bolus 1,000 mL (0 mLs Intravenous Stopped 11/27/22 1031)  LORazepam (ATIVAN) injection 2 mg (2 mg Intravenous Given 11/27/22 1023)  levETIRAcetam (KEPPRA) IVPB 1500 mg/ 100 mL premix (0 mg Intravenous Stopped 11/27/22 1045)  levETIRAcetam (KEPPRA) IVPB 1500 mg/ 100 mL premix (0 mg Intravenous Stopped 11/27/22 1059)    ED Course/ Medical Decision Making/ A&P Clinical Course as of 11/27/22 1137  Wed Nov 27, 2022  1021 Brief 5-10s like seizure activity with desat to 80s. Patient had not return to baseline mental status from previous seizure. Giving ativan will load with keppra.   [LA]  1026 I was called emergently to bedside  for altered mental status.  Per bedside nurse, patient has had another seizure episode with a desaturation event to the low 80s and bit her tongue. Patient is confused at this time does not appear to have returned to baseline per conversation with PA.  Treating as status epilepticus at this time giving Ativan and Keppra load.  Appears to be noncompliance on valproic acid but not certain since patient's mental status is uncertain.  Required admission for last presentation similar.  Likely neurology consult and admission for status treatment. [CC]  1031 Spoke with Rory Percy, MD neurology. He is okay with 3g keppra, so I have added on another 1500mg . He will come see her at bedside and likely will have spot EEG. I have also reassessed her and she continues to be hypoxic despite being on 6L at this time. Slowly improved from around 80% during seizure  activity. Will obtain CXR. May have aspirated.  [LA]    Clinical Course User Index [CC] Tretha Sciara, MD [LA] Mickie Hillier, PA-C    Medical Decision Making Amount and/or Complexity of Data Reviewed Labs: ordered. Radiology: ordered.  Risk Prescription drug management. Decision regarding hospitalization.  Initial Impression and Ddx 57 year old female who presents to the emergency department with witnessed seizure-like activity Patient PMH that increases complexity of ED encounter: Seizures, alcohol use Differential: Seizure, stroke, toxidrome, hypoglycemia, electrolyte dysfunction, etc.  Interpretation of Diagnostics I independent reviewed and interpreted the labs as followed: Subtherapeutic Depakote level, mild leukocytosis to 11.2, likely reactive.  Ethanol negative, CMP unremarkable, glucose normal  - I independently visualized the following imaging with scope of interpretation limited to determining acute life threatening conditions related to emergency care: CT head, C-spine, maxillofacial, which revealed no acute findings.  Chest x-ray with likely aspiration pneumonia  Patient Reassessment and Ultimate Disposition/Management 57 year old, ill-appearing female who presents after seizure this morning.  She is either postictal or altered on my evaluation.  Incomprehensible speech.  She intermittently will follow my commands but I have seen her move all extremities with equal strength.  Will obtain labs, CT head imaging and reevaluate  Just before 1030 patient had repeated seizure here in the emergency department with desat down to low 80s.  She was placed on nonrebreather and then transition to nasal cannula around 4 to 5 L with improving O2 sat.  On my reevaluation her lungs sound coarse and question whether she had aspiration events obtaining chest x-ray.  Given that she received again we will load her with Keppra and consult neurology.  Will also give her 2 mg of  Ativan.  1031: Spoke with Dr. Malen Gauze, neurology who is requesting 3 g of Keppra, will order repeated EEG for the patient and will see in consult.  1135: Spoke with internal medicine who agrees to admit the patient.  Patient will need admission for ongoing observation status epilepticus.  She had CT imaging of her head which was negative for stroke or bleed.  No hypoglycemic events.  Her electrolytes are within normal limits.  UDS is pending.  Ethanol is negative.  Suspect that her seizure-like activity is from not taking her Depakote.  Patient management required discussion with the following services or consulting groups:  Hospitalist Service and Neurology  Complexity of Problems Addressed Acute illness or injury that poses threat of life of bodily function  Additional Data Reviewed and Analyzed Further history obtained from: EMS on arrival, Past medical history and medications listed in the EMR, Prior ED visit notes, and Care Everywhere  Patient  Encounter Risk Assessment Prescriptions, SDOH impact on management, Use of parenteral controlled substances, and Consideration of hospitalization  Final Clinical Impression(s) / ED Diagnoses Final diagnoses:  Seizure [facility])    Rx / DC Orders ED Discharge Orders     None         Mickie Hillier, PA-C 11/27/22 1138    Mickie Hillier, PA-C 11/27/22 1140    Tretha Sciara, MD 11/27/22 1529

## 2022-11-27 NOTE — Consult Note (Signed)
Neurology Consultation  Reason for Consult: Multiple seizures Referring Physician: Dr. Oswald Hillock  CC: Seizures  History is obtained from: Chart  HPI: Theresa Rios is a 57 y.o. female past medical history of seizure disorder, polysubstance abuse, alcohol abuse, suicidal, seen a few times in the last 6 months for breakthrough seizures, presenting for evaluation of seizures from [facility]. She had 1 seizure yesterday and another 1 this morning as well as 1 witnessed seizure of about 45 seconds in the emergency room. According to the ED providers, between the second and third seizure, she did not return to baseline. EMS provided history of possible noncompliance to medications that she is on Depakote at home.  She was on Keppra which was discontinued given her history of MDD and prior suicide attempt and Depakote was chosen instead due to its mood stabilizing effects. Patient is unable to provide any history at this time.   LKW: Unclear-sometime last night IV thrombolysis given?: no, outside the window due to unclear last known well Premorbid modified Rankin scale (mRS): Unable to ascertain  ROS: Unable to obtain due to altered mental status.   Past Medical History:  Diagnosis Date   Seizures ([place])      No family history on file.   Social History:   reports that she has been smoking cigarettes. She has been smoking an average of .5 packs per day. She has never used smokeless tobacco. She reports current alcohol use. She reports current drug use. Drugs: Cocaine and Marijuana.  Medications  Current Facility-Administered Medications:    levETIRAcetam (KEPPRA) IVPB 1500 mg/ 100 mL premix, 1,500 mg, Intravenous, Once, Mickie Hillier, PA-C, Last Rate: 400 mL/hr at 11/27/22 1031, 1,500 mg at 11/27/22 1031   levETIRAcetam (KEPPRA) IVPB 1500 mg/ 100 mL premix, 1,500 mg, Intravenous, Once, Mickie Hillier, PA-C  Current Outpatient Medications:    divalproex (DEPAKOTE) 500 MG DR tablet, Take  1 tablet (500 mg total) by mouth every 12 (twelve) hours., Disp: 60 tablet, Rfl: 0   gabapentin (NEURONTIN) 300 MG capsule, Take 1 capsule (300 mg total) by mouth 2 (two) times daily., Disp: 60 capsule, Rfl: 0   nicotine polacrilex (NICORETTE) 2 MG gum, Take 1 each (2 mg total) by mouth as needed for smoking cessation. (Patient not taking: Reported on 09/28/2022), Disp: 30 tablet, Rfl: 0   OLANZapine zydis (ZYPREXA) 5 MG disintegrating tablet, Take 1 tablet (5 mg total) by mouth at bedtime., Disp: 30 tablet, Rfl: 0   sertraline (ZOLOFT) 50 MG tablet, Take 1 tablet (50 mg total) by mouth daily., Disp: 30 tablet, Rfl: 0   traZODone (DESYREL) 50 MG tablet, Take 1 tablet (50 mg total) by mouth at bedtime as needed for sleep., Disp: 30 tablet, Rfl: 0   Exam: Current vital signs: BP 138/74   Pulse 73   Temp 97.6 F (36.4 C) (Oral)   Resp 20   LMP 05/17/2014   SpO2 91%  Vital signs in last 24 hours: Temp:  [97.6 F (36.4 C)] 97.6 F (36.4 C) (03/20 0834) Pulse Rate:  [73-80] 73 (03/20 0915) Resp:  [11-20] 20 (03/20 0915) BP: (127-152)/(68-86) 138/74 (03/20 0915) SpO2:  [90 %-92 %] 91 % (03/20 0915) General: Obtunded in no acute distress HEENT: Normocephalic atraumatic Chest: Clear Cardiovascular: Regular rhythm Abdomen nondistended nontender Neurological exam The patient is obtunded Does not open eyes to voice or noxious stimulation Nonverbal, does not follow commands Cranial nerves: Pupils equal round reactive to light, gaze is midline, does not blink  to threat from either side, question some subtle right lower facial weakness at rest. Motor examination: She is moving all 4 extremities spontaneously and to noxious stimulation but I question a subtle right upper extremity weakness in comparison to the left.  She very readily localizes with the left upper extremity noxious stimulation but is slower to do that with the right upper extremity. Sensation: As above Coordination cannot be  assessed given her mentation.  NIHSS 1a Level of Conscious.: 2 1b LOC Questions: 2 1c LOC Commands: 2 2 Best Gaze: 0 3 Visual: 0 4 Facial Palsy: 1 5a Motor Arm - left: 2 5b Motor Arm - Right: 2 6a Motor Leg - Left: 3 6b Motor Leg - Right: 3 7 Limb Ataxia: 0 8 Sensory: 1 9 Best Language: 3 10 Dysarthria: 2 11 Extinct. and Inatten.: 0 TOTAL: 23  Labs I have reviewed labs in epic and the results pertinent to this consultation are: CBC    Component Value Date/Time   WBC 11.2 (H) 11/27/2022 0837   RBC 4.58 11/27/2022 0837   HGB 12.5 11/27/2022 0837   HCT 40.4 11/27/2022 0837   PLT 209 11/27/2022 0837   MCV 88.2 11/27/2022 0837   MCH 27.3 11/27/2022 0837   MCHC 30.9 11/27/2022 0837   RDW 15.4 11/27/2022 0837   LYMPHSABS 2.8 11/27/2022 0837   MONOABS 1.3 (H) 11/27/2022 0837   EOSABS 0.1 11/27/2022 0837   BASOSABS 0.1 11/27/2022 0837  CMP     Component Value Date/Time   NA 140 11/27/2022 0837   K 3.9 11/27/2022 0837   CL 106 11/27/2022 0837   CO2 24 11/27/2022 0837   GLUCOSE 111 (H) 11/27/2022 0837   BUN 6 11/27/2022 0837   CREATININE 0.73 11/27/2022 0837   CALCIUM 9.1 11/27/2022 0837   PROT 7.0 11/27/2022 0837   ALBUMIN 3.5 11/27/2022 0837   AST 22 11/27/2022 0837   ALT 18 11/27/2022 0837   ALKPHOS 76 11/27/2022 0837   BILITOT 0.6 11/27/2022 0837   GFRNONAA >60 11/27/2022 0837   GFRAA >60 10/11/2019 2000   Imaging I have reviewed the images obtained:  CT-head-no acute intracranial process.  Assessment: 57 year old with a significant past medical history of psychiatric illness including MDD, prior suicide attempts, polysubstance abuse and alcohol abuse, seizures presenting for evaluation of multiple seizures since last night.  Total of 3 seizures since last night with not returning back to baseline between seizure #2 and seizure #3 makes me concerned for ongoing status epilepticus.  Other differentials include alcohol or drug intoxication lowering seizure  threshold. On examination, she is obtunded but also has a subtle right lower facial weakness and right arm weakness compared to the left.  I would not be surprised if she had a small stroke given history of cocaine abuse but this could also be Todd's paralysis.   Impression Breakthrough seizure, evaluate for status epilepticus Alcohol and polysubstance abuse Right-sided weakness-small stroke versus Todd's paralysis  Recommendations: Load with Keppra 3 g IV x 1 She has received 2 mg of Ativan IV Check valproate level.  Continuing dosage of valproate based on the level. Seizure precautions Stat EEG MRI of the brain without contrast when possible UDS, UA, chest x-ray Plan was discussed with Theodis Blaze, PA-c   ADDENDUM EEG with no seizures. Valproate level undetectable Recommend loading with valproate 20 mg/kg.  Continue valproate 500 mg twice daily. Can discontinue Keppra. Maintain seizure precautions Check UDS  -- Amie Portland, MD Neurologist [facility] Pager: [phone]  CRITICAL CARE ATTESTATION Performed by: Amie Portland, MD Total critical care time: 33 minutes Critical care time was exclusive of separately billable procedures and treating other patients and/or supervising APPs/Residents/Students Critical care was necessary to treat or prevent imminent or life-threatening deterioration. This patient is critically ill and at significant risk for neurological worsening and/or death and care requires constant monitoring. Critical care was time spent personally by me on the following activities: development of treatment plan with patient and/or surrogate as well as nursing, discussions with consultants, evaluation of patient's response to treatment, examination of patient, obtaining history from patient or surrogate, ordering and performing treatments and interventions, ordering and review of laboratory studies, ordering and review of radiographic studies, pulse  oximetry, re-evaluation of patient's condition, participation in multidisciplinary rounds and medical decision making of high complexity in the care of this patient.

## 2022-11-27 NOTE — ED Notes (Signed)
Pt is now axox4. 

## 2022-11-27 NOTE — ED Notes (Signed)
Patient awake and alert now. Given [place] sandwich and water

## 2022-11-27 NOTE — H&P (Cosign Needed Addendum)
Date: 11/27/2022               Patient Name:  Theresa Rios MRN: [id number]  DOB: November 20, 1965 Age / Sex: 57 y.o., female   PCP: Synthia Innocent Audrea Muscat, NP         Medical Service: Internal Medicine Teaching Service         Attending Physician: Dr. Velna Ochs, MD    First Contact: Dr. Iona Coach Pager: [phone]  Second Contact: Dr. Gaylan Gerold Pager: [phone]       After Hours (After 5p/  First Contact Pager: [phone]  weekends / holidays): Second Contact Pager: [phone]   Chief Complaint: witnessed seizure  History of Present Illness:   Theresa Rios is a 57 year old female living with depression with previous suicide attempt, alcohol use disorder, housing instability, and seizure disorder presenting after 2 witnessed seizures. Her seizures were witnessed at the [facility], which is where she is currently staying.   She was loaded with keppra and received ativan in the ED after another witnessed seizure episode. She is very somnolent although wakes up briefly to verbal stimuli and shaking her shoulders. Able to answer orientation questions and is oriented to person, place, time, and reason for coming to ED although subsequently falls back asleep. Was able to move all her extremities and follow some simple commands, but as aforementioned, she is somnolent so unable to gather full history today.  Spoke with spouse, Judie Petit. States that he is unsure if she has been taking her depakote. States that she had seizures but he was not present during these events. She does not reside with him and she is unhomed. He last saw her yesterday. States she is currently at the [facility]. States that she has not been drinking alcohol around her.  On reevaluation, she is on CPAP. Able to wake up some more and answer some questions. States that she is taking her seizure medication. She last drank alcohol on Sunday, 1 beer. Does not use any other recreational drugs. Denies any SHOB, cough, fevers, chills,  dysuria, urinary frequency. States she was told she had seizures, was not awake for them. She gets her medications from the health department. Denies any history of alcohol withdrawals. Does not drink every day any more.   ED course: Vitals are stable. CBC with mild leukocytosis (11.2). CMP with mild hyperglycemia, but otherwise stable kidney function and normal electrolytes. Ethanol level undetectable. Valproic acid level undetectable. UA negative. CT head, C-spine, and maxillofacial without contrast negative for acute abnormalities. CXR showing bilateral lower lobe opacities, concerning for possible aspiration pneumonitis or pneumonia in the setting of seizures. Neurology consulted. IMTS asked to admit.    Meds: No current facility-administered medications on file prior to encounter.   Current Outpatient Medications on File Prior to Encounter  Medication Sig Dispense Refill   divalproex (DEPAKOTE) 500 MG DR tablet Take 1 tablet (500 mg total) by mouth every 12 (twelve) hours. 60 tablet 0   gabapentin (NEURONTIN) 300 MG capsule Take 1 capsule (300 mg total) by mouth 2 (two) times daily. 60 capsule 0   nicotine polacrilex (NICORETTE) 2 MG gum Take 1 each (2 mg total) by mouth as needed for smoking cessation. (Patient not taking: Reported on 09/28/2022) 30 tablet 0   OLANZapine zydis (ZYPREXA) 5 MG disintegrating tablet Take 1 tablet (5 mg total) by mouth at bedtime. 30 tablet 0   sertraline (ZOLOFT) 50 MG tablet Take 1 tablet (50 mg total) by  mouth daily. 30 tablet 0   traZODone (DESYREL) 50 MG tablet Take 1 tablet (50 mg total) by mouth at bedtime as needed for sleep. 30 tablet 0     Allergies: Allergies as of 11/27/2022   (No Known Allergies)   Past Medical History:  Diagnosis Date   Seizures ([place])     Family History:  Unable to obtain  Social History:  Unable to obtain. However, per chart review, cocaine use and marijuana use have been documented. Also with documented tobacco use 0.5  pack/day. Does have alcohol use disorder, most recent chart review indicating about 3-4 beers/day.   Review of Systems: A complete ROS was negative except as per HPI.   Physical Exam: Blood pressure (!) 143/114, pulse 88, temperature 98.5 F (36.9 C), temperature source Axillary, resp. rate 20, last menstrual period 05/17/2014, SpO2 96 %. General: sleeping comfortably in bed with EEG leads in place, NAD. On reevaluation, more awake and alert, comfortable appearing.  HENT: [place]/AT. EEG leads in place. CV: normal rate and regular rhythm, no m/r/g. Pulm: coarse breath sounds in bilateral bases. Normal WOB on RA, saturating 99%. Abdomen: soft, nondistended, nontender, normoactive bowel sounds. MSK: no peripheral edema. Skin: warm and dry. Neuro: AAOx3, no focal deficits. Moving all extremities spontaneously. Psych: normal mood and affect.   EKG: personally reviewed my interpretation is NSR and LVH.   EEG adult  Result Date: 11/27/2022 Lora Havens, MD     11/27/2022 12:11 PM Patient Name: Theresa Rios MRN: [id number] Epilepsy Attending: Lora Havens Referring Physician/Provider: Amie Portland, MD Date: 11/27/2022 Duration: 29.15 mins Patient history: 57 year old with a significant past medical history of psychiatric illness including MDD, prior suicide attempts, polysubstance abuse and alcohol abuse, seizures presenting for evaluation of multiple seizures since last night. EEG to evaluate for seizure Level of alertness: Awake, asleep AEDs during EEG study: LEV, VPA, Ativan Technical aspects: This EEG study was done with scalp electrodes positioned according to the 10-20 International system of electrode placement. Electrical activity was reviewed with band pass filter of 1-70Hz , sensitivity of 7 uV/mm, display speed of 58mm/sec with a 60Hz  notched filter applied as appropriate. EEG data were recorded continuously and digitally stored.  Video monitoring was available and reviewed as  appropriate. Description: No posterior dominant rhythm was seen. Sleep was characterized by vertex waves, sleep spindles (12 to 14 Hz), maximal frontocentral region. There is an excessive amount of 13 to 13 Hz beta activity distributed symmetrically and diffusely.  Hyperventilation and photic stimulation were not performed.    ABNORMALITY - Excessive beta, generalized  IMPRESSION: This study is within normal limits. The excessive beta activity seen in the background is most likely due to the effect of benzodiazepine and is a benign EEG pattern. No seizures or epileptiform discharges were seen throughout the recording.  A normal interictal EEG does not exclude the diagnosis of epilepsy.  Lora Havens   DG Chest Port 1 View  Result Date: 11/27/2022 CLINICAL DATA:  Concern for aspiration. EXAM: PORTABLE CHEST 1 VIEW COMPARISON:  Chest radiograph dated September 27, 2022. FINDINGS: The heart is mildly enlarged. Bilateral lower lobe hazy and reticular opacities, concerning for airspace disease, differential includes pulmonary edema or aspiration pneumonia. No pleural effusion or focal consolidation. No acute osseous abnormality. IMPRESSION: Bilateral lower lobe hazy and reticular opacities, concerning for airspace disease, differential includes pulmonary edema or aspiration pneumonia. Electronically Signed   By: Keane Police D.O.   On: 11/27/2022 11:00   CT Head  Wo Contrast  Result Date: 11/27/2022 CLINICAL DATA:  Blunt facial trauma.  Seizures. EXAM: CT HEAD WITHOUT CONTRAST CT MAXILLOFACIAL WITHOUT CONTRAST CT CERVICAL SPINE WITHOUT CONTRAST TECHNIQUE: Multidetector CT imaging of the head, cervical spine, and maxillofacial structures were performed using the standard protocol without intravenous contrast. Multiplanar CT image reconstructions of the cervical spine and maxillofacial structures were also generated. RADIATION DOSE REDUCTION: This exam was performed according to the departmental dose-optimization  program which includes automated exposure control, adjustment of the mA and/or kV according to patient size and/or use of iterative reconstruction technique. COMPARISON:  09/27/2022 FINDINGS: CT HEAD FINDINGS Brain: Brain has normal appearance without evidence of atrophy, stroke, mass, hemorrhage, hydrocephalus or extra-axial collection. Vascular: No abnormal vascular finding. Skull: No skull fracture. Other: None CT MAXILLOFACIAL FINDINGS Osseous: No acute facial fracture. Old/chronic/healed medial orbital blowout fracture on the right. Chronic in word bowing of the zygomatic arch on the left. Chronic dental decay. Orbits: No soft tissue orbital injury. Old medial blowout fracture on the right as noted above. Sinuses: No traumatic finding. Chronic inflammatory changes of the maxillary sinuses, right more than left. Small amount of layering fluid. Soft tissues: No significant soft tissue injury identified by CT. Bilateral tonsillar prominence, not primarily evaluated. CT CERVICAL SPINE FINDINGS Alignment: Straightening of the normal cervical lordosis Skull base and vertebrae: No regional fracture. Soft tissues and spinal canal: No traumatic soft tissue finding. Disc levels: The foramen magnum is widely patent. There is ordinary mild osteoarthritis of the C1-2 articulation but no encroachment upon the neural structures. Chronic degenerative spondylosis at C3-4, C4-5, C5-6 and C6-7. No apparent compressive bony canal or foraminal narrowing however. Chronic facet osteoarthritis at the C7-T1 level on the right. Upper chest: Negative Other: None IMPRESSION: HEAD CT: Normal. MAXILLOFACIAL CT: No acute facial fracture. Old/chronic/healed medial orbital blowout fracture on the right. Chronic bowing of the zygomatic arch on the left. CERVICAL SPINE CT: No acute or traumatic finding. Chronic degenerative changes as outlined above. Electronically Signed   By: Nelson Chimes M.D.   On: 11/27/2022 09:51   CT Cervical Spine Wo  Contrast  Result Date: 11/27/2022 CLINICAL DATA:  Blunt facial trauma.  Seizures. EXAM: CT HEAD WITHOUT CONTRAST CT MAXILLOFACIAL WITHOUT CONTRAST CT CERVICAL SPINE WITHOUT CONTRAST TECHNIQUE: Multidetector CT imaging of the head, cervical spine, and maxillofacial structures were performed using the standard protocol without intravenous contrast. Multiplanar CT image reconstructions of the cervical spine and maxillofacial structures were also generated. RADIATION DOSE REDUCTION: This exam was performed according to the departmental dose-optimization program which includes automated exposure control, adjustment of the mA and/or kV according to patient size and/or use of iterative reconstruction technique. COMPARISON:  09/27/2022 FINDINGS: CT HEAD FINDINGS Brain: Brain has normal appearance without evidence of atrophy, stroke, mass, hemorrhage, hydrocephalus or extra-axial collection. Vascular: No abnormal vascular finding. Skull: No skull fracture. Other: None CT MAXILLOFACIAL FINDINGS Osseous: No acute facial fracture. Old/chronic/healed medial orbital blowout fracture on the right. Chronic in word bowing of the zygomatic arch on the left. Chronic dental decay. Orbits: No soft tissue orbital injury. Old medial blowout fracture on the right as noted above. Sinuses: No traumatic finding. Chronic inflammatory changes of the maxillary sinuses, right more than left. Small amount of layering fluid. Soft tissues: No significant soft tissue injury identified by CT. Bilateral tonsillar prominence, not primarily evaluated. CT CERVICAL SPINE FINDINGS Alignment: Straightening of the normal cervical lordosis Skull base and vertebrae: No regional fracture. Soft tissues and spinal canal: No traumatic soft tissue  finding. Disc levels: The foramen magnum is widely patent. There is ordinary mild osteoarthritis of the C1-2 articulation but no encroachment upon the neural structures. Chronic degenerative spondylosis at C3-4, C4-5,  C5-6 and C6-7. No apparent compressive bony canal or foraminal narrowing however. Chronic facet osteoarthritis at the C7-T1 level on the right. Upper chest: Negative Other: None IMPRESSION: HEAD CT: Normal. MAXILLOFACIAL CT: No acute facial fracture. Old/chronic/healed medial orbital blowout fracture on the right. Chronic bowing of the zygomatic arch on the left. CERVICAL SPINE CT: No acute or traumatic finding. Chronic degenerative changes as outlined above. Electronically Signed   By: Nelson Chimes M.D.   On: 11/27/2022 09:51   CT Maxillofacial WO CM  Result Date: 11/27/2022 CLINICAL DATA:  Blunt facial trauma.  Seizures. EXAM: CT HEAD WITHOUT CONTRAST CT MAXILLOFACIAL WITHOUT CONTRAST CT CERVICAL SPINE WITHOUT CONTRAST TECHNIQUE: Multidetector CT imaging of the head, cervical spine, and maxillofacial structures were performed using the standard protocol without intravenous contrast. Multiplanar CT image reconstructions of the cervical spine and maxillofacial structures were also generated. RADIATION DOSE REDUCTION: This exam was performed according to the departmental dose-optimization program which includes automated exposure control, adjustment of the mA and/or kV according to patient size and/or use of iterative reconstruction technique. COMPARISON:  09/27/2022 FINDINGS: CT HEAD FINDINGS Brain: Brain has normal appearance without evidence of atrophy, stroke, mass, hemorrhage, hydrocephalus or extra-axial collection. Vascular: No abnormal vascular finding. Skull: No skull fracture. Other: None CT MAXILLOFACIAL FINDINGS Osseous: No acute facial fracture. Old/chronic/healed medial orbital blowout fracture on the right. Chronic in word bowing of the zygomatic arch on the left. Chronic dental decay. Orbits: No soft tissue orbital injury. Old medial blowout fracture on the right as noted above. Sinuses: No traumatic finding. Chronic inflammatory changes of the maxillary sinuses, right more than left. Small amount  of layering fluid. Soft tissues: No significant soft tissue injury identified by CT. Bilateral tonsillar prominence, not primarily evaluated. CT CERVICAL SPINE FINDINGS Alignment: Straightening of the normal cervical lordosis Skull base and vertebrae: No regional fracture. Soft tissues and spinal canal: No traumatic soft tissue finding. Disc levels: The foramen magnum is widely patent. There is ordinary mild osteoarthritis of the C1-2 articulation but no encroachment upon the neural structures. Chronic degenerative spondylosis at C3-4, C4-5, C5-6 and C6-7. No apparent compressive bony canal or foraminal narrowing however. Chronic facet osteoarthritis at the C7-T1 level on the right. Upper chest: Negative Other: None IMPRESSION: HEAD CT: Normal. MAXILLOFACIAL CT: No acute facial fracture. Old/chronic/healed medial orbital blowout fracture on the right. Chronic bowing of the zygomatic arch on the left. CERVICAL SPINE CT: No acute or traumatic finding. Chronic degenerative changes as outlined above. Electronically Signed   By: Nelson Chimes M.D.   On: 11/27/2022 09:51     Assessment & Plan by Problem: Principal Problem:   Seizure ([place]) Active Problems:   MDD (major depressive episode), single episode, severe, no psychosis ([place])   Alcohol use disorder   Housing instability  Seizure Alcohol use disorder Patient presenting after 2 witnessed seizure-like activity and had 1 seizure while in the ED. Unclear if she has been nonadherent with home depakote. She states she is adherent but depakote level in ED undetectable. She was loaded with 3g keppra and 2mg  ativan while in the ED. Labs are essentially stable. She is on depakote for mood stabilizing properties in the setting of comorbid severe depression. Neurology is following. EEG without seizure or epileptiform discharges. Initial CT imaging negative, but getting MRI brain per  neuro. It appears that she has had seizures both in the setting of alcohol use and  without alcohol use, so unable to definitively state that seizures are due to alcohol withdrawals. She has had a previous admission in August 2023 for alcohol withdrawal seizures and required librium taper at that time. Discussed with neurology, given that depakote level is undetectable, will load with depakote and resume home dose. -appreciate neurology management -s/p keppra and ativan -seizure and aspiration precautions -f/u UDS -ordered MRI brain without contrast -keppra stopped -IV valproate 1500mg    -resumed home depakote 500mg  BID -CIWA q6h without ativan -thiamine, folic acid, MVA -telemetry -more awake and alert now, will start a diet for her  Aspiration pneumonitis vs PNA In the setting of seizures. CXR with bilateral lower lobe opacities, concerning for aspiration pneumonitis versus pneumonia. She does have a mild leukocytosis, but this could be related to seizure. She is afebrile and saturating well (>95%) on RA. Will hold off on antibiotics at this time given lower concern for pneumonia at this time. Low threshold to start empiric antibiotics, consider unasyn, if becomes febrile or has worsening O2 requirements.  -supplemental O2 if needed to maintain O2 sats >92% -start unasyn if febrile or worsening O2 requirements  Severe major depression with history of suicide attempt She appears to be stable from this standpoint currently. No suicidal ideation, homicidal ideation. Will continue home medications. -continue home zyprexa 5mg  qhs -continue home zoloft 50mg  daily -continue home trazodone 50mg  qhs prn for sleep  Housing Instability Lives at [facility] currently.   Dispo: Admit patient to Observation with expected length of stay less than 2 midnights.  Signed: Virl Axe, MD 11/27/2022, 2:21 PM  Pager: [phone] After 5pm on weekdays and 1pm on weekends: On Call pager: [phone]

## 2022-11-27 NOTE — ED Notes (Signed)
Pt actively seizing. MD called to bedside. MD witnessed seizure. Seizure lasted about 45 seconds with incontinence and tongue trauma.

## 2022-11-27 NOTE — Hospital Course (Addendum)
    Theresa Rios (spouse) Diagnosed with seizures in the past. He is not sure if she is forgetting to take meds, thinks she may have. Not present for seizures. Does not live with him. Last saw her yesterday. Says she is currently at [facility]. Says he has not seen her consume alcohol around her. No smell of alcohol. No knowledge of her substance use. Has two sisters here in [place]. Doesn't have contacts for sisters. He has no transportation, also lack of housing. Says he could make decisions for her. Says she is full code.   03/21: Patient is AxO x4. She states that she is feeling tired today and very sleepy. She states that she does not have any other weakness. She denies any CP, SOB, or cough. She is eating well with no concerns. She states that she does not have any trouble getting her medications. She has no trouble taking them. She denies any barriers to obtaining her medications. Did talk to patient about the importance of taking her meds daily. She does understand that staying away from substance use is important. She states in the past month she has used it 3 times. She never purchased cocaine before and only uses when she is with er friends who has them. She drinks beer daily. Usually 2 a day and has 2 12oz beers a day.

## 2022-11-27 NOTE — Procedures (Signed)
Patient Name: Theresa Rios  MRN: [id number]  Epilepsy Attending: Lora Havens  Referring Physician/Provider: Amie Portland, MD  Date: 11/27/2022 Duration: 29.15 mins  Patient history: 57 year old with a significant past medical history of psychiatric illness including MDD, prior suicide attempts, polysubstance abuse and alcohol abuse, seizures presenting for evaluation of multiple seizures since last night. EEG to evaluate for seizure  Level of alertness: Awake, asleep  AEDs during EEG study: LEV, VPA, Ativan  Technical aspects: This EEG study was done with scalp electrodes positioned according to the 10-20 International system of electrode placement. Electrical activity was reviewed with band pass filter of 1-70Hz , sensitivity of 7 uV/mm, display speed of 73mm/sec with a 60Hz  notched filter applied as appropriate. EEG data were recorded continuously and digitally stored.  Video monitoring was available and reviewed as appropriate.  Description: No posterior dominant rhythm was seen. Sleep was characterized by vertex waves, sleep spindles (12 to 14 Hz), maximal frontocentral region. There is an excessive amount of 13 to 13 Hz beta activity distributed symmetrically and diffusely.  Hyperventilation and photic stimulation were not performed.      ABNORMALITY - Excessive beta, generalized   IMPRESSION: This study is within normal limits. The excessive beta activity seen in the background is most likely due to the effect of benzodiazepine and is a benign EEG pattern. No seizures or epileptiform discharges were seen throughout the recording.   A normal interictal EEG does not exclude the diagnosis of epilepsy.   Arria Naim Barbra Sarks

## 2022-11-27 NOTE — ED Notes (Signed)
This NT placed a purewick on this pt

## 2022-11-28 ENCOUNTER — Other Ambulatory Visit (HOSPITAL_COMMUNITY): Payer: Self-pay

## 2022-11-28 ENCOUNTER — Observation Stay (HOSPITAL_COMMUNITY): Payer: Medicaid Other

## 2022-11-28 DIAGNOSIS — R569 Unspecified convulsions: Secondary | ICD-10-CM | POA: Diagnosis not present

## 2022-11-28 LAB — CBC WITH DIFFERENTIAL/PLATELET
Abs Immature Granulocytes: 0.02 10*3/uL (ref 0.00–0.07)
Basophils Absolute: 0 10*3/uL (ref 0.0–0.1)
Basophils Relative: 0 %
Eosinophils Absolute: 0.1 10*3/uL (ref 0.0–0.5)
Eosinophils Relative: 1 %
HCT: 37.6 % (ref 36.0–46.0)
Hemoglobin: 12.4 g/dL (ref 12.0–15.0)
Immature Granulocytes: 0 %
Lymphocytes Relative: 34 %
Lymphs Abs: 3.2 10*3/uL (ref 0.7–4.0)
MCH: 28.1 pg (ref 26.0–34.0)
MCHC: 33 g/dL (ref 30.0–36.0)
MCV: 85.1 fL (ref 80.0–100.0)
Monocytes Absolute: 0.9 10*3/uL (ref 0.1–1.0)
Monocytes Relative: 10 %
Neutro Abs: 5.2 10*3/uL (ref 1.7–7.7)
Neutrophils Relative %: 55 %
Platelets: 193 10*3/uL (ref 150–400)
RBC: 4.42 MIL/uL (ref 3.87–5.11)
RDW: 15.2 % (ref 11.5–15.5)
WBC: 9.4 10*3/uL (ref 4.0–10.5)
nRBC: 0 % (ref 0.0–0.2)

## 2022-11-28 LAB — BASIC METABOLIC PANEL
Anion gap: 7 (ref 5–15)
BUN: <5 mg/dL — ABNORMAL LOW (ref 6–20)
CO2: 26 mmol/L (ref 22–32)
Calcium: 8.9 mg/dL (ref 8.9–10.3)
Chloride: 107 mmol/L (ref 98–111)
Creatinine, Ser: 0.64 mg/dL (ref 0.44–1.00)
GFR, Estimated: >60 mL/min (ref >60)
Glucose, Bld: 103 mg/dL — ABNORMAL HIGH (ref 70–99)
Potassium: 3.1 mmol/L — ABNORMAL LOW (ref 3.5–5.1)
Sodium: 140 mmol/L (ref 135–145)

## 2022-11-28 MED ORDER — DIVALPROEX SODIUM 500 MG PO DR TAB
500.0000 mg | DELAYED_RELEASE_TABLET | Freq: Two times a day (BID) | ORAL | 0 refills | Status: DC
  Filled 2022-11-28: qty 60, 30d supply, fill #0

## 2022-11-28 MED ORDER — POTASSIUM CHLORIDE CRYS ER 20 MEQ PO TBCR
40.0000 meq | EXTENDED_RELEASE_TABLET | Freq: Two times a day (BID) | ORAL | Status: AC
  Administered 2022-11-28 (×2): 40 meq via ORAL
  Filled 2022-11-28 (×2): qty 2

## 2022-11-28 NOTE — Progress Notes (Signed)
Neurology Progress Note  Brief HPI: 57 year old patient with history of seizure disorder, polysubstance abuse, alcohol abuse, suicidal ideation who has been seen recently for breakthrough seizures presented for evaluation of seizures.  She had 1 seizure the day prior to admission, 1 seizure the day of admission and another witnessed seizure in the emergency department.  Patient is on Depakote at home and reports compliance, however Depakote level on admission was undetectable.  She was originally on Keppra but had been changed to Depakote last admission due to mood stabilizing effects.  Urine toxicology was positive for cocaine.  Patient was loaded with 3 g of Keppra, given 2 mg of Ativan and loaded with valproic acid 20 mg/kg.  Subjective: Patient is drowsy this morning but oriented to self, place, time and situation.  She has no complaints.  She reports compliance with Depakote and expresses surprise that her urine toxicology was positive for cocaine.  Exam: Vitals:   11/28/22 0523 11/28/22 0731  BP:  136/82  Pulse:  76  Resp:  16  Temp:  97.8 F (36.6 C)  SpO2: 93% 95%   Gen: In bed, NAD Resp: non-labored breathing, no acute distress Abd: soft, nt  Neuro: Mental Status: Drowsy but oriented to self, place, time and situation Cranial Nerves: Pupils equal round and reactive, extraocular movements intact, facial sensation symmetrical, face symmetrical, hearing intact to voice, phonation normal, shoulder shrug symmetrical, tongue midline Motor: Able to move all 4 extremities with symmetrical, normal antigravity strength Sensory: Intact to light touch throughout Gait: Deferred  Pertinent Labs:    Latest Ref Rng & Units 11/28/2022    4:12 AM 11/27/2022    8:37 AM 10/01/2022    3:44 PM  CBC  WBC 4.0 - 10.5 K/uL 9.4  11.2  9.8   Hemoglobin 12.0 - 15.0 g/dL 12.4  12.5  11.6   Hematocrit 36.0 - 46.0 % 37.6  40.4  36.1   Platelets 150 - 400 K/uL 193  209  181        Latest Ref Rng &  Units 11/28/2022    4:12 AM 11/27/2022    8:37 AM 10/01/2022    3:44 PM  BMP  Glucose 70 - 99 mg/dL 103  111  124   BUN 6 - 20 mg/dL 5  6  10    Creatinine 0.44 - 1.00 mg/dL 0.64  0.73  0.70   Sodium 135 - 145 mmol/L 140  140  144   Potassium 3.5 - 5.1 mmol/L 3.1  3.9  3.6   Chloride 98 - 111 mmol/L 107  106  110   CO2 22 - 32 mmol/L 26  24  25    Calcium 8.9 - 10.3 mg/dL 8.9  9.1  9.0    UDS positive for cocaine Valproic acid level undetectable Ethanol negative  Imaging Reviewed:  CT head: No acute abnormalities  MRI brain: No acute abnormalities  EEG: Excessive beta, generalized, likely side effect of benzodiazepines with no seizures or epileptiform discharges seen  Assessment: 24-year-old patient with history of seizure disorder, polysubstance abuse, alcohol abuse and suicidal ideation presented with seizures in the setting of noncompliance with AEDs (detectable valproic acid level despite patient report that she is taking this medication) and cocaine use.  Patient had 1 seizure the day prior to admission, 1 seizure the day of admission and another seizure witnessed in the ED.  She was loaded with 3 g of Keppra, given 2 mg of IV Ativan and loaded with valproate 20 mg/kg  after valproate level returned undetectable.  Patient denies having problems obtaining her medications.  She was counseled to avoid cocaine in the future and voices agreement with this.  EEG shows excessive beta but no seizure activity.  Seizure was likely caused by cocaine use and noncompliance with AEDs.  Impression: Seizures in the setting of noncompliance with AEDs and cocaine use  Recommendations: continue valproate 500 mg twice daily maintain seizure precautions TOC consult for assistance with cocaine cessation Seizure precautions as pasted below in red Outpatient neurology follow-up in 4 to 6 weeks.  Plan relayed to primary team resident physician.  Ferguson , MSN, AGACNP-BC [facility] See Amion for schedule and pager information 11/28/2022 8:40 AM    SEIZURE PRECAUTIONS Per [facility] statutes, patients with seizures are not allowed to drive until they have been seizure-free for six months.   Use caution when using heavy equipment or power tools. Avoid working on ladders or at heights. Take showers instead of baths. Ensure the water temperature is not too high on the home water heater. Do not go swimming alone. Do not lock yourself in a room alone (i.e. bathroom). When caring for infants or small children, sit down when holding, feeding, or changing them to minimize risk of injury to the child in the event you have a seizure. Maintain good sleep hygiene. Avoid alcohol.    If patient has another seizure, call 911 and bring them back to the ED if: A.  The seizure lasts longer than 5 minutes.      B.  The patient doesn't wake shortly after the seizure or has new problems such as difficulty seeing, speaking or moving following the seizure C.  The patient was injured during the seizure D.  The patient has a temperature over 102 F (39C) E.  The patient vomited during the seizure and now is having trouble breathing   Attending Neurohospitalist Addendum Patient seen and examined with APP/Resident. Agree with the history and physical as documented above. Agree with the plan as documented, which I helped formulate. I have independently reviewed the chart, obtained history, review of systems and examined the patient.I have personally reviewed pertinent head/neck/spine imaging (CT/MRI).   Patient with a history of polysubstance abuse and seizures, reports compliance to medication but Depakote level was undetectable. Resumed on Depakote.  Continue Depakote 500 mg twice daily.  Counseled on importance of medication compliance and cocaine cessation.  She verbalized understanding. Maintain seizure precautions Follow-up with outpatient neurology in 4 to 6  weeks  Please feel free to call with any questions.  -- Amie Portland, MD Neurologist [facility] Pager: [phone]

## 2022-11-28 NOTE — TOC Transition Note (Signed)
Transition of Care [facility]) - CM/SW Discharge Note   Patient Details  Name: Theresa Rios MRN: [id number] Date of Birth: 18-Sep-1965  Transition of Care [facility]) CM/SW Contact:  Pollie Friar, RN Phone Number: 11/28/2022, 3:11 PM   Clinical Narrative:    Pt is discharging back to the [facility] today.  CM has covered the cost of d/c medications and they have been delivered to the room per [place]. Cab voucher provided to bedside RN for transport to [facility].   Final next level of care: Homeless Shelter Barriers to Discharge: Inadequate or no insurance, Barriers Unresolved (comment)   Patient Goals and CMS Choice      Discharge Placement                         Discharge Plan and Services Additional resources added to the After Visit Summary for     Discharge Planning Services: CM Consult                                 Social Determinants of Health (SDOH) Interventions SDOH Screenings   Food Insecurity: Food Insecurity Present (11/27/2022)  Housing: High Risk (11/27/2022)  Transportation Needs: Unmet Transportation Needs (11/27/2022)  Utilities: At Risk (11/27/2022)  Alcohol Screen: Medium Risk (08/31/2022)  Depression (PHQ2-9): Low Risk  (04/03/2022)  Tobacco Use: High Risk (11/27/2022)     Readmission Risk Interventions     No data to display

## 2022-11-28 NOTE — Progress Notes (Signed)
0221: To MRI via bed with transporter.  91: Returned to 3W-34 via bed with transporter. Placed back on tele. Vitals obtained.

## 2022-11-28 NOTE — Plan of Care (Signed)

## 2022-11-28 NOTE — TOC Initial Note (Signed)
Transition of Care [facility]) - Initial/Assessment Note    Patient Details  Name: Theresa Rios MRN: [id number] Date of Birth: [birth year]  Transition of Care [facility]) CM/SW Contact:    Pollie Friar, RN Phone Number: 11/28/2022, 11:56 AM  Clinical Narrative:                 Pt has been staying at [facility] CM inquired if she has gotten on the list for housing and she says there are people ahead of her that need that.  She says Bevely Palmer at the [facility] sees her for primary care. She denies not taking her seizure medication as prescribed.  Her plan is to return to the [facility] when medically ready. CM will provide her with transportation when d/ced.  ToC following.  Expected Discharge Plan: Homeless Shelter Barriers to Discharge: Continued Medical Work up   Patient Goals and CMS Choice            Expected Discharge Plan and Services   Discharge Planning Services: CM Consult   Living arrangements for the past 2 months: Homeless Shelter                                      Prior Living Arrangements/Services Living arrangements for the past 2 months: Homeless Shelter   Patient language and need for interpreter reviewed:: Yes Do you feel safe going back to the place where you live?: Yes            Criminal Activity/Legal Involvement Pertinent to Current Situation/Hospitalization: No - Comment as needed  Activities of Daily Living Home Assistive Devices/Equipment: None ADL Screening (condition at time of admission) Patient's cognitive ability adequate to safely complete daily activities?: Yes Is the patient deaf or have difficulty hearing?: No Does the patient have difficulty seeing, even when wearing glasses/contacts?: No Does the patient have difficulty concentrating, remembering, or making decisions?: No Patient able to express need for assistance with ADLs?: Yes Does the patient have difficulty dressing or bathing?: No Independently performs ADLs?: Yes (appropriate for  developmental age) Does the patient have difficulty walking or climbing stairs?: No Weakness of Legs: None Weakness of Arms/Hands: None  Permission Sought/Granted                  Emotional Assessment Appearance:: Appears stated age Attitude/Demeanor/Rapport: Engaged Affect (typically observed): Accepting Orientation: : Oriented to Self, Oriented to Place, Oriented to  Time, Oriented to Situation Alcohol / Substance Use: Illicit Drugs Psych Involvement: No (comment)  Admission diagnosis:  Seizure [facility]) [R56.9] Patient Active Problem List   Diagnosis Date Noted   COVID-19 09/27/2022   Housing instability 09/06/2022   MDD (major depressive disorder), recurrent episode, severe ([place]) 08/31/2022   MDD (major depressive disorder), recurrent, severe, 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   MDD (major depressive disorder), recurrent episode, moderate ([place]) 05/10/2022   MDD (major depressive disorder), recurrent severe, without psychosis ([place]) 05/06/2022   Seizure ([place]) 04/11/2022   MDD (major depressive episode), single episode, severe, no psychosis ([place]) 03/21/2022   Alcohol use disorder 03/21/2022   Anxiety state 03/21/2022   Insomnia 03/21/2022   Suicide attempt by drug ingestion ([place]) 03/19/2022   PCP:  Marliss Coots, NP Pharmacy:   [place], [place] - [address] [place] [address] Phone: [phone] Fax: [place], [place] -  [address] [place] [address] Phone: [phone] Fax: [phone]     Social Determinants of Health (SDOH) Social History: SDOH Screenings   Food Insecurity: Food Insecurity Present (11/27/2022)  Housing: High Risk (11/27/2022)  Transportation Needs: Unmet Transportation Needs (11/27/2022)  Utilities: At Risk (11/27/2022)  Alcohol Screen: Medium Risk (08/31/2022)   Depression (PHQ2-9): Low Risk  (04/03/2022)  Tobacco Use: High Risk (11/27/2022)   SDOH Interventions:     Readmission Risk Interventions     No data to display

## 2022-11-28 NOTE — TOC CAGE-AID Note (Signed)
Transition of Care [facility]) - CAGE-AID Screening   Patient Details  Name: Theresa Rios MRN: [id number] Date of Birth: [birth year]  Transition of Care [facility]) CM/SW Contact:    Pollie Friar, RN Phone Number: 11/28/2022, 11:54 AM   Clinical Narrative: Pt refused the inpatient/ outpatient counseling resources for drug use.    CAGE-AID Screening:    Have You Ever Felt You Ought to Cut Down on Your Drinking or Drug Use?: Yes Have People Annoyed You By Critizing Your Drinking Or Drug Use?: No Have You Felt Bad Or Guilty About Your Drinking Or Drug Use?: No Have You Ever Had a Drink or Used Drugs First Thing In The Morning to Steady Your Nerves or to Get Rid of a Hangover?: No CAGE-AID Score: 1  Substance Abuse Education Offered: Yes (pt refused)  Substance abuse interventions: Scientist, clinical (histocompatibility and immunogenetics), Patient Counseling

## 2022-11-28 NOTE — Discharge Summary (Signed)
Name: Theresa Rios MRN: [id number] DOB: 1965/09/25 57 y.o. PCP: Marliss Coots, NP  Date of Admission: 11/27/2022  8:21 AM Date of Discharge: 11/28/2022 1:12 PM Attending Physician: Velna Ochs, MD  Discharge Diagnosis: 1. Principal Problem:   Seizure ([place]) Active Problems:   MDD (major depressive episode), single episode, severe, no psychosis ([place])   Alcohol use disorder   Housing instability Polysubstance use   Discharge Medications: Allergies as of 11/28/2022   No Known Allergies      Medication List     TAKE these medications    divalproex 500 MG DR tablet Commonly known as: DEPAKOTE Take 1 tablet (500 mg total) by mouth every 12 (twelve) hours.   gabapentin 300 MG capsule Commonly known as: NEURONTIN Take 1 capsule (300 mg total) by mouth 2 (two) times daily.   nicotine polacrilex 2 MG gum Commonly known as: NICORETTE Take 1 each (2 mg total) by mouth as needed for smoking cessation.   OLANZapine zydis 5 MG disintegrating tablet Commonly known as: ZYPREXA Take 1 tablet (5 mg total) by mouth at bedtime.   sertraline 50 MG tablet Commonly known as: ZOLOFT Take 1 tablet (50 mg total) by mouth daily.   traZODone 50 MG tablet Commonly known as: DESYREL Take 1 tablet (50 mg total) by mouth at bedtime as needed for sleep.        Disposition and follow-up:   Ms.Charlyne L Delagrange was discharged from [facility] in Stable condition.  At the hospital follow up visit please address:  1.   Polysubstance use -continue working on alcohol and cocaine cessation  Seizure disorder -continue working on medication adherence.  2.  Labs / imaging needed at time of follow-up: NA  3.  Pending labs/ test needing follow-up: NA  Follow-up Appointments:  Follow up with your primary care doctor within 1 week of leaving the hospital.   Hospital Course by problem list: Theresa Rios is a 57 year old female living with depression with previous  suicide attempt, alcohol use disorder, housing instability, and seizure disorder presenting after 2 witnessed seizures. Her seizures were witnessed at the [facility], which is where she is currently staying.   Seizure Alcohol use disorder Patient presenting after 2 witnessed seizure-like activity and had 1 seizure while in the ED. Unclear if she has been nonadherent with home depakote. She states she is adherent but depakote level in ED undetectable. Alcohol level undetectable, patient reported drinking 1 beer two days prior to admission. UDS positive for cocaine. She was loaded with 3g keppra and 2mg  ativan while in the ED.  EEG without seizure or epileptiform discharges. Initial CT imaging negative. Patient had R sided weakness per neuro, MRI brain to evaluate for stroke with no abnormalities, most likely reflected todds phenomenon. Neurology followed during admission, restarted patient on home depakote. Most likely substance use and non-adherence causing seizure. CIWA 0 in hospital.Counseled on alcohol and substance cessation. Counseled on no driving for 6 months from the last seizure as long as she remains seizure free. Depakote brought to bedside at discharge. F/u with neurology in 4-6 weeks.  Polysubstance use UDS positive for cocaine in the setting of seizure. Counseled patient on the importance of cessation. She feels she can do this.  Bibasilar lung opacities In the setting of seizures. CXR with bilateral lower lobe opacities, concerning for aspiration pneumonitis versus pneumonia. She had a mild leukocytosis to 11K, improved on day 1 of hospitalization to [place]. Most likely demargination from seizure.  Afebrile and HDS during admission. No oxygen desaturations. No clinical evidence of pneumonia or pneumonitis. Did not treat with antibiotics, monitored throughout admission.   Leukocytosis Presented with WBC count of  11.2 down to 9.4 on the day of admission. Cxr with evidence of aspiration pneumonia vs  pneumonitis. No evidence of pneumonia, also timeline not consistent with that. Could theoretically be pneumonitis, but no respiratory decompensation while on RA in the hospital. No urinary sxs, UA not consistent with infection. No GI symptoms of N/V or diarrhea. No evidence of CNS infection on MRI in the setting of MRI. Most likely demargination.   Severe major depression with history of suicide attempt  No suicidal ideation, homicidal ideation. Continued home zyprexa 5mg  qhs,home zoloft 50mg  daily,home trazodone 50mg  qhs prn for sleep.   Housing Instability Lives at [facility] currently, discharged to [facility].   Subjective: Patient is AxO x4. She states that she is feeling tired today and very sleepy. She states that she does not have any other weakness. She denies any CP, SOB, or cough. She is eating well with no concerns. She states that she does not have any trouble getting her medications. She has no trouble taking them. She denies any barriers to obtaining her medications. Did talk to patient about the importance of taking her meds daily. She does understand that staying away from substance use is important. She states in the past month she has used it 3 times. She never purchased cocaine before and only uses when she is with er friends who has them. She drinks beer daily. Usually 2 a day and has 2 12oz beers a day.  Discharge Exam:   BP (!) 140/92 (BP Location: Right Arm)   Pulse 74   Temp 98.5 F (36.9 C) (Oral)   Resp 18   Ht 5\' 4"  (1.626 m)   Wt 75 kg   LMP 05/17/2014   SpO2 94%   BMI 28.38 kg/m  Discharge exam:  General:Lethargic, laying in bed, NAD HENT: contusion and superficial abrasion overlying L orbit CV: normal rate and regular rhythm, no m/r/g. Pulm: Upper airway sounds transmitted to all lung fields. Normal WOB on RA Abdomen: soft, nondistended, nontender, normoactive bowel sounds. MSK: no peripheral edema. Skin: warm and dry. Neuro: Lethargic, intermittently falling asleep,  oriented x4, no focal deficits. Moving all extremities spontaneously, 5/5 bilateral UE and LE strength.. Psych: normal mood and affect.  Pertinent Labs, Studies, and Procedures:     Latest Ref Rng & Units 11/28/2022    4:12 AM 11/27/2022    8:37 AM 10/01/2022    3:44 PM  CBC  WBC 4.0 - 10.5 K/uL 9.4  11.2  9.8   Hemoglobin 12.0 - 15.0 g/dL 12.4  12.5  11.6   Hematocrit 36.0 - 46.0 % 37.6  40.4  36.1   Platelets 150 - 400 K/uL 193  209  181        Latest Ref Rng & Units 11/28/2022    4:12 AM 11/27/2022    8:37 AM 10/01/2022    3:44 PM  BMP  Glucose 70 - 99 mg/dL 103  111  124   BUN 6 - 20 mg/dL 5  6  10    Creatinine 0.44 - 1.00 mg/dL 0.64  0.73  0.70   Sodium 135 - 145 mmol/L 140  140  144   Potassium 3.5 - 5.1 mmol/L 3.1  3.9  3.6   Chloride 98 - 111 mmol/L 107  106  110   CO2  22 - 32 mmol/L 26  24  25    Calcium 8.9 - 10.3 mg/dL 8.9  9.1  9.0    MR BRAIN WO CONTRAST  Result Date: 11/28/2022 CLINICAL DATA:  Initial evaluation for seizure. EXAM: MRI HEAD WITHOUT CONTRAST TECHNIQUE: Multiplanar, multiecho pulse sequences of the brain and surrounding structures were obtained without intravenous contrast. COMPARISON:  Prior CT from 11/27/2022 as well as previous MRI from 09/27/2022. FINDINGS: Brain: Examination intermittently degraded by motion artifact. Cerebral volume within normal limits. No significant cerebral white matter disease or other focal parenchymal signal abnormality. No evidence for acute or subacute ischemia. Gray-white matter differentiation maintained. No areas of chronic cortical infarction. No acute or chronic intracranial blood products. No mass lesion, midline shift or mass effect. No hydrocephalus or extra-axial fluid collection. Pituitary gland and suprasellar region within normal limits. No visible intrinsic temporal lobe abnormality. Vascular: Major intracranial vascular flow voids are maintained. Slight asymmetry of the superior orbital veins noted, slightly larger on  the right, of doubtful significance. No visible abnormality about the cavernous sinus. Skull and upper cervical spine: Cranial junction within normal limits. Bone marrow signal intensity heterogeneous overall within normal limits. No visible focal marrow replacing lesion. Left periorbital soft tissue contusion noted. No other scalp soft tissue abnormality. Sinuses/Orbits: Globes orbital soft tissues demonstrate no other acute finding. Remote posttraumatic defect noted at the right lamina papyracea. Scattered mucosal thickening noted throughout the ethmoidal air cells and maxillary sinuses. No significant mastoid effusion. Other: None. IMPRESSION: 1. Normal brain MRI. No acute intracranial abnormality identified. 2. Left periorbital soft tissue contusion. Electronically Signed   By: Jeannine Boga M.D.   On: 11/28/2022 03:26   DG Abd 1 View  Result Date: 11/27/2022 CLINICAL DATA:  Seizure EXAM: ABDOMEN - 1 VIEW COMPARISON:  09/27/2022 FINDINGS: The bowel gas pattern is normal. No radio-opaque calculi or other significant radiographic abnormality are seen. IMPRESSION: Negative. Electronically Signed   By: Rolm Baptise M.D.   On: 11/27/2022 19:30   EEG adult  Result Date: 11/27/2022 Lora Havens, MD     11/27/2022 12:11 PM Patient Name: KIMBRE WESTERKAMP MRN: [id number] Epilepsy Attending: Lora Havens Referring Physician/Provider: Amie Portland, MD Date: 11/27/2022 Duration: 29.15 mins Patient history: 57 year old with a significant past medical history of psychiatric illness including MDD, prior suicide attempts, polysubstance abuse and alcohol abuse, seizures presenting for evaluation of multiple seizures since last night. EEG to evaluate for seizure Level of alertness: Awake, asleep AEDs during EEG study: LEV, VPA, Ativan Technical aspects: This EEG study was done with scalp electrodes positioned according to the 10-20 International system of electrode placement. Electrical activity was reviewed  with band pass filter of 1-70Hz , sensitivity of 7 uV/mm, display speed of 58mm/sec with a 60Hz  notched filter applied as appropriate. EEG data were recorded continuously and digitally stored.  Video monitoring was available and reviewed as appropriate. Description: No posterior dominant rhythm was seen. Sleep was characterized by vertex waves, sleep spindles (12 to 14 Hz), maximal frontocentral region. There is an excessive amount of 13 to 13 Hz beta activity distributed symmetrically and diffusely.  Hyperventilation and photic stimulation were not performed.    ABNORMALITY - Excessive beta, generalized  IMPRESSION: This study is within normal limits. The excessive beta activity seen in the background is most likely due to the effect of benzodiazepine and is a benign EEG pattern. No seizures or epileptiform discharges were seen throughout the recording.  A normal interictal EEG does not exclude the diagnosis  of epilepsy.  Lora Havens   DG Chest Port 1 View  Result Date: 11/27/2022 CLINICAL DATA:  Concern for aspiration. EXAM: PORTABLE CHEST 1 VIEW COMPARISON:  Chest radiograph dated September 27, 2022. FINDINGS: The heart is mildly enlarged. Bilateral lower lobe hazy and reticular opacities, concerning for airspace disease, differential includes pulmonary edema or aspiration pneumonia. No pleural effusion or focal consolidation. No acute osseous abnormality. IMPRESSION: Bilateral lower lobe hazy and reticular opacities, concerning for airspace disease, differential includes pulmonary edema or aspiration pneumonia. Electronically Signed   By: Keane Police D.O.   On: 11/27/2022 11:00   CT Head Wo Contrast  Result Date: 11/27/2022 CLINICAL DATA:  Blunt facial trauma.  Seizures. EXAM: CT HEAD WITHOUT CONTRAST CT MAXILLOFACIAL WITHOUT CONTRAST CT CERVICAL SPINE WITHOUT CONTRAST TECHNIQUE: Multidetector CT imaging of the head, cervical spine, and maxillofacial structures were performed using the standard  protocol without intravenous contrast. Multiplanar CT image reconstructions of the cervical spine and maxillofacial structures were also generated. RADIATION DOSE REDUCTION: This exam was performed according to the departmental dose-optimization program which includes automated exposure control, adjustment of the mA and/or kV according to patient size and/or use of iterative reconstruction technique. COMPARISON:  09/27/2022 FINDINGS: CT HEAD FINDINGS Brain: Brain has normal appearance without evidence of atrophy, stroke, mass, hemorrhage, hydrocephalus or extra-axial collection. Vascular: No abnormal vascular finding. Skull: No skull fracture. Other: None CT MAXILLOFACIAL FINDINGS Osseous: No acute facial fracture. Old/chronic/healed medial orbital blowout fracture on the right. Chronic in word bowing of the zygomatic arch on the left. Chronic dental decay. Orbits: No soft tissue orbital injury. Old medial blowout fracture on the right as noted above. Sinuses: No traumatic finding. Chronic inflammatory changes of the maxillary sinuses, right more than left. Small amount of layering fluid. Soft tissues: No significant soft tissue injury identified by CT. Bilateral tonsillar prominence, not primarily evaluated. CT CERVICAL SPINE FINDINGS Alignment: Straightening of the normal cervical lordosis Skull base and vertebrae: No regional fracture. Soft tissues and spinal canal: No traumatic soft tissue finding. Disc levels: The foramen magnum is widely patent. There is ordinary mild osteoarthritis of the C1-2 articulation but no encroachment upon the neural structures. Chronic degenerative spondylosis at C3-4, C4-5, C5-6 and C6-7. No apparent compressive bony canal or foraminal narrowing however. Chronic facet osteoarthritis at the C7-T1 level on the right. Upper chest: Negative Other: None IMPRESSION: HEAD CT: Normal. MAXILLOFACIAL CT: No acute facial fracture. Old/chronic/healed medial orbital blowout fracture on the  right. Chronic bowing of the zygomatic arch on the left. CERVICAL SPINE CT: No acute or traumatic finding. Chronic degenerative changes as outlined above. Electronically Signed   By: Nelson Chimes M.D.   On: 11/27/2022 09:51   CT Cervical Spine Wo Contrast  Result Date: 11/27/2022 CLINICAL DATA:  Blunt facial trauma.  Seizures. EXAM: CT HEAD WITHOUT CONTRAST CT MAXILLOFACIAL WITHOUT CONTRAST CT CERVICAL SPINE WITHOUT CONTRAST TECHNIQUE: Multidetector CT imaging of the head, cervical spine, and maxillofacial structures were performed using the standard protocol without intravenous contrast. Multiplanar CT image reconstructions of the cervical spine and maxillofacial structures were also generated. RADIATION DOSE REDUCTION: This exam was performed according to the departmental dose-optimization program which includes automated exposure control, adjustment of the mA and/or kV according to patient size and/or use of iterative reconstruction technique. COMPARISON:  09/27/2022 FINDINGS: CT HEAD FINDINGS Brain: Brain has normal appearance without evidence of atrophy, stroke, mass, hemorrhage, hydrocephalus or extra-axial collection. Vascular: No abnormal vascular finding. Skull: No skull fracture. Other: None CT MAXILLOFACIAL  FINDINGS Osseous: No acute facial fracture. Old/chronic/healed medial orbital blowout fracture on the right. Chronic in word bowing of the zygomatic arch on the left. Chronic dental decay. Orbits: No soft tissue orbital injury. Old medial blowout fracture on the right as noted above. Sinuses: No traumatic finding. Chronic inflammatory changes of the maxillary sinuses, right more than left. Small amount of layering fluid. Soft tissues: No significant soft tissue injury identified by CT. Bilateral tonsillar prominence, not primarily evaluated. CT CERVICAL SPINE FINDINGS Alignment: Straightening of the normal cervical lordosis Skull base and vertebrae: No regional fracture. Soft tissues and spinal  canal: No traumatic soft tissue finding. Disc levels: The foramen magnum is widely patent. There is ordinary mild osteoarthritis of the C1-2 articulation but no encroachment upon the neural structures. Chronic degenerative spondylosis at C3-4, C4-5, C5-6 and C6-7. No apparent compressive bony canal or foraminal narrowing however. Chronic facet osteoarthritis at the C7-T1 level on the right. Upper chest: Negative Other: None IMPRESSION: HEAD CT: Normal. MAXILLOFACIAL CT: No acute facial fracture. Old/chronic/healed medial orbital blowout fracture on the right. Chronic bowing of the zygomatic arch on the left. CERVICAL SPINE CT: No acute or traumatic finding. Chronic degenerative changes as outlined above. Electronically Signed   By: Nelson Chimes M.D.   On: 11/27/2022 09:51   CT Maxillofacial WO CM  Result Date: 11/27/2022 CLINICAL DATA:  Blunt facial trauma.  Seizures. EXAM: CT HEAD WITHOUT CONTRAST CT MAXILLOFACIAL WITHOUT CONTRAST CT CERVICAL SPINE WITHOUT CONTRAST TECHNIQUE: Multidetector CT imaging of the head, cervical spine, and maxillofacial structures were performed using the standard protocol without intravenous contrast. Multiplanar CT image reconstructions of the cervical spine and maxillofacial structures were also generated. RADIATION DOSE REDUCTION: This exam was performed according to the departmental dose-optimization program which includes automated exposure control, adjustment of the mA and/or kV according to patient size and/or use of iterative reconstruction technique. COMPARISON:  09/27/2022 FINDINGS: CT HEAD FINDINGS Brain: Brain has normal appearance without evidence of atrophy, stroke, mass, hemorrhage, hydrocephalus or extra-axial collection. Vascular: No abnormal vascular finding. Skull: No skull fracture. Other: None CT MAXILLOFACIAL FINDINGS Osseous: No acute facial fracture. Old/chronic/healed medial orbital blowout fracture on the right. Chronic in word bowing of the zygomatic arch  on the left. Chronic dental decay. Orbits: No soft tissue orbital injury. Old medial blowout fracture on the right as noted above. Sinuses: No traumatic finding. Chronic inflammatory changes of the maxillary sinuses, right more than left. Small amount of layering fluid. Soft tissues: No significant soft tissue injury identified by CT. Bilateral tonsillar prominence, not primarily evaluated. CT CERVICAL SPINE FINDINGS Alignment: Straightening of the normal cervical lordosis Skull base and vertebrae: No regional fracture. Soft tissues and spinal canal: No traumatic soft tissue finding. Disc levels: The foramen magnum is widely patent. There is ordinary mild osteoarthritis of the C1-2 articulation but no encroachment upon the neural structures. Chronic degenerative spondylosis at C3-4, C4-5, C5-6 and C6-7. No apparent compressive bony canal or foraminal narrowing however. Chronic facet osteoarthritis at the C7-T1 level on the right. Upper chest: Negative Other: None IMPRESSION: HEAD CT: Normal. MAXILLOFACIAL CT: No acute facial fracture. Old/chronic/healed medial orbital blowout fracture on the right. Chronic bowing of the zygomatic arch on the left. CERVICAL SPINE CT: No acute or traumatic finding. Chronic degenerative changes as outlined above. Electronically Signed   By: Nelson Chimes M.D.   On: 11/27/2022 09:51     Discharge Instructions: Discharge Instructions     Diet - low sodium heart healthy   Complete  by: As directed    Increase activity slowly   Complete by: As directed      You were admitted for a seizure. You were stabilized and started back on your home medications. You should take these as directed everyday to prevent further seizures. Your drug screen showed cocaine. You should stop using cocaine as it increases risk of seizures. It also increases risk of injury to the brain, heart, and other organs. You should stop using alcohol which can also contribute to seizures. You should not drive  until you have been seizure free for 6 months.  SEIZURE PRECAUTIONS Per [facility] statutes, patients with seizures are not allowed to drive until they have been seizure-free for six months.    Use caution when using heavy equipment or power tools. Avoid working on ladders or at heights. Take showers instead of baths. Ensure the water temperature is not too high on the home water heater. Do not go swimming alone. Do not lock yourself in a room alone (i.e. bathroom). When caring for infants or small children, sit down when holding, feeding, or changing them to minimize risk of injury to the child in the event you have a seizure. Maintain good sleep hygiene. Avoid alcohol.    If patient has another seizure, call 911 and bring them back to the ED if: A.  The seizure lasts longer than 5 minutes.      B.  The patient doesn't wake shortly after the seizure or has new problems such as difficulty seeing, speaking or moving following the seizure C.  The patient was injured during the seizure D.  The patient has a temperature over 102 F (39C) E.  The patient vomited during the seizure and now is having trouble breathing  Signed: Iona Coach, MD 11/28/2022, 1:12 PM   Pager: Iona Coach, MD Internal Medicine Resident, PGY-1 [facility]  Pager: [phone]

## 2022-11-29 ENCOUNTER — Other Ambulatory Visit (HOSPITAL_COMMUNITY): Payer: Self-pay

## 2022-11-29 DIAGNOSIS — R569 Unspecified convulsions: Secondary | ICD-10-CM | POA: Diagnosis not present

## 2022-11-29 LAB — GLUCOSE, CAPILLARY: Glucose-Capillary: 116 mg/dL — ABNORMAL HIGH (ref 70–99)

## 2022-11-29 MED ORDER — DIVALPROEX SODIUM 500 MG PO DR TAB
500.0000 mg | DELAYED_RELEASE_TABLET | Freq: Two times a day (BID) | ORAL | 0 refills | Status: DC
  Filled 2022-11-29: qty 60, 30d supply, fill #0

## 2022-11-29 NOTE — Progress Notes (Signed)
Primary RN called to request taxi transport for patient. Primary RN was told by taxi company that they would not be able to extend transport for this patient due to lack of drivers. Patient unable to discharge. Patient updated, PIV and telemetry removed, AVS placed on chart. Patient still pending discharge. Will pass along to 7A RN during bedside shift report.

## 2022-11-29 NOTE — Progress Notes (Signed)
Patient remained in hospital overnight due to inability to obtain transport due to lack of taxi drivers. She remains discharged from a medical standpoint. Please see discharge summary by Dr. Stann Mainland from 3/21 for full details.

## 2022-11-29 NOTE — Discharge Summary (Signed)
Name: Theresa Rios MRN: [id number] DOB: Jul 25, 1966 57 y.o. PCP: Marliss Coots, NP  Date of Admission: 11/27/2022  8:21 AM Date of Discharge: 11/29/2022 10:51 PM Attending Physician: Lottie Mussel, MD  Discharge Diagnosis: 1. Principal Problem:   Seizure ([place]) Active Problems:   MDD (major depressive episode), single episode, severe, no psychosis ([place])   Alcohol use disorder   Housing instability Polysubstance use   Discharge Medications: Allergies as of 11/29/2022   No Known Allergies      Medication List     TAKE these medications    divalproex 500 MG DR tablet Commonly known as: DEPAKOTE Take 1 tablet (500 mg total) by mouth every 12 (twelve) hours.   gabapentin 300 MG capsule Commonly known as: NEURONTIN Take 1 capsule (300 mg total) by mouth 2 (two) times daily.   nicotine polacrilex 2 MG gum Commonly known as: NICORETTE Take 1 each (2 mg total) by mouth as needed for smoking cessation.   OLANZapine zydis 5 MG disintegrating tablet Commonly known as: ZYPREXA Take 1 tablet (5 mg total) by mouth at bedtime.   sertraline 50 MG tablet Commonly known as: ZOLOFT Take 1 tablet (50 mg total) by mouth daily.   traZODone 50 MG tablet Commonly known as: DESYREL Take 1 tablet (50 mg total) by mouth at bedtime as needed for sleep.        Disposition and follow-up:   Theresa Rios was discharged from [facility] in Stable condition.  At the hospital follow up visit please address:  1.   Polysubstance use -continue working on alcohol and cocaine cessation  Seizure disorder -continue working on medication adherence.  2.  Labs / imaging needed at time of follow-up: NA  3.  Pending labs/ test needing follow-up: NA  Follow-up Appointments:  Follow up with your primary care doctor within 1 week of leaving the hospital.   Hospital Course by problem list: Theresa Rios is a 57 year old female living with depression with previous  suicide attempt, alcohol use disorder, housing instability, and seizure disorder presenting after 2 witnessed seizures. Her seizures were witnessed at the [facility], which is where she is currently staying.   Seizure Alcohol use disorder Patient presenting after 2 witnessed seizure-like activity and had 1 seizure while in the ED. Unclear if she has been nonadherent with home depakote. She states she is adherent but depakote level in ED undetectable. Alcohol level undetectable, patient reported drinking 1 beer two days prior to admission. UDS positive for cocaine. She was loaded with 3g keppra and 2mg  ativan while in the ED.  EEG without seizure or epileptiform discharges. Initial CT imaging negative. Patient had R sided weakness per neuro, MRI brain to evaluate for stroke with no abnormalities, most likely reflected todds phenomenon. Neurology followed during admission, restarted patient on home depakote. Most likely substance use and non-adherence causing seizure. CIWA 0 in hospital.Counseled on alcohol and substance cessation. Counseled on no driving for 6 months from the last seizure as long as she remains seizure free. Depakote brought to bedside at discharge. F/u with neurology in 4-6 weeks. Discharged 3/21, taxi did not come on time. Stayed overnight, discharged again 3/22.  Polysubstance use UDS positive for cocaine in the setting of seizure. Counseled patient on the importance of cessation. She feels she can do this.  Bibasilar lung opacities In the setting of seizures. CXR with bilateral lower lobe opacities, concerning for aspiration pneumonitis versus pneumonia. She had a mild leukocytosis to 11K,  improved on day 1 of hospitalization to [place]. Most likely demargination from seizure. Afebrile and HDS during admission. No oxygen desaturations. No clinical evidence of pneumonia or pneumonitis. Did not treat with antibiotics, monitored throughout admission.   Leukocytosis Presented with WBC count of  11.2  down to 9.4 on the day of admission. Cxr with evidence of aspiration pneumonia vs pneumonitis. No evidence of pneumonia, also timeline not consistent with that. Could theoretically be pneumonitis, but no respiratory decompensation while on RA in the hospital. No urinary sxs, UA not consistent with infection. No GI symptoms of N/V or diarrhea. No evidence of CNS infection on MRI in the setting of MRI. Most likely demargination.   Severe major depression with history of suicide attempt  No suicidal ideation, homicidal ideation. Continued home zyprexa 5mg  qhs,home zoloft 50mg  daily,home trazodone 50mg  qhs prn for sleep.   Housing Instability Lives at [facility] currently, discharged to [facility].   Subjective: Patient is doing well. Eating and using the bathroom. No pain. Good strength. Wasn't able to get a taxi yesterday, wants to get one today to go to the [facility].  Discharged 3/21, taxi did not come on time. Stayed overnight, discharged again 3/22.  Discharge Exam:   BP 127/83 (BP Location: Right Arm)   Pulse 78   Temp 98.2 F (36.8 C)   Resp 16   Ht 5\' 4"  (1.626 m)   Wt 75 kg   LMP 05/17/2014   SpO2 93%   BMI 28.38 kg/m  Discharge exam:  General:Lethargic, laying in bed, NAD HENT: contusion and superficial abrasion overlying L orbit CV: normal rate and regular rhythm, no m/r/g. Pulm: Upper airway sounds transmitted to all lung fields. Normal WOB on RA Extremities: warm and dry, no LE edema Neuro: Alert and awake, normal attention. Moving all extremities spontaneously/. Psych: normal mood and affect.  Pertinent Labs, Studies, and Procedures:     Latest Ref Rng & Units 11/28/2022    4:12 AM 11/27/2022    8:37 AM 10/01/2022    3:44 PM  CBC  WBC 4.0 - 10.5 K/uL 9.4  11.2  9.8   Hemoglobin 12.0 - 15.0 g/dL 12.4  12.5  11.6   Hematocrit 36.0 - 46.0 % 37.6  40.4  36.1   Platelets 150 - 400 K/uL 193  209  181        Latest Ref Rng & Units 11/28/2022    4:12 AM 11/27/2022    8:37 AM 10/01/2022     3:44 PM  BMP  Glucose 70 - 99 mg/dL 103  111  124   BUN 6 - 20 mg/dL 5  6  10    Creatinine 0.44 - 1.00 mg/dL 0.64  0.73  0.70   Sodium 135 - 145 mmol/L 140  140  144   Potassium 3.5 - 5.1 mmol/L 3.1  3.9  3.6   Chloride 98 - 111 mmol/L 107  106  110   CO2 22 - 32 mmol/L 26  24  25    Calcium 8.9 - 10.3 mg/dL 8.9  9.1  9.0    MR BRAIN WO CONTRAST  Result Date: 11/28/2022 CLINICAL DATA:  Initial evaluation for seizure. EXAM: MRI HEAD WITHOUT CONTRAST TECHNIQUE: Multiplanar, multiecho pulse sequences of the brain and surrounding structures were obtained without intravenous contrast. COMPARISON:  Prior CT from 11/27/2022 as well as previous MRI from 09/27/2022. FINDINGS: Brain: Examination intermittently degraded by motion artifact. Cerebral volume within normal limits. No significant cerebral white matter disease or other focal  parenchymal signal abnormality. No evidence for acute or subacute ischemia. Gray-white matter differentiation maintained. No areas of chronic cortical infarction. No acute or chronic intracranial blood products. No mass lesion, midline shift or mass effect. No hydrocephalus or extra-axial fluid collection. Pituitary gland and suprasellar region within normal limits. No visible intrinsic temporal lobe abnormality. Vascular: Major intracranial vascular flow voids are maintained. Slight asymmetry of the superior orbital veins noted, slightly larger on the right, of doubtful significance. No visible abnormality about the cavernous sinus. Skull and upper cervical spine: Cranial junction within normal limits. Bone marrow signal intensity heterogeneous overall within normal limits. No visible focal marrow replacing lesion. Left periorbital soft tissue contusion noted. No other scalp soft tissue abnormality. Sinuses/Orbits: Globes orbital soft tissues demonstrate no other acute finding. Remote posttraumatic defect noted at the right lamina papyracea. Scattered mucosal thickening noted  throughout the ethmoidal air cells and maxillary sinuses. No significant mastoid effusion. Other: None. IMPRESSION: 1. Normal brain MRI. No acute intracranial abnormality identified. 2. Left periorbital soft tissue contusion. Electronically Signed   By: Jeannine Boga M.D.   On: 11/28/2022 03:26   DG Abd 1 View  Result Date: 11/27/2022 CLINICAL DATA:  Seizure EXAM: ABDOMEN - 1 VIEW COMPARISON:  09/27/2022 FINDINGS: The bowel gas pattern is normal. No radio-opaque calculi or other significant radiographic abnormality are seen. IMPRESSION: Negative. Electronically Signed   By: Rolm Baptise M.D.   On: 11/27/2022 19:30   EEG adult  Result Date: 11/27/2022 Lora Havens, MD     11/27/2022 12:11 PM Patient Name: Theresa Rios MRN: [id number] Epilepsy Attending: Lora Havens Referring Physician/Provider: Amie Portland, MD Date: 11/27/2022 Duration: 29.15 mins Patient history: 57 year old with a significant past medical history of psychiatric illness including MDD, prior suicide attempts, polysubstance abuse and alcohol abuse, seizures presenting for evaluation of multiple seizures since last night. EEG to evaluate for seizure Level of alertness: Awake, asleep AEDs during EEG study: LEV, VPA, Ativan Technical aspects: This EEG study was done with scalp electrodes positioned according to the 10-20 International system of electrode placement. Electrical activity was reviewed with band pass filter of 1-70Hz , sensitivity of 7 uV/mm, display speed of 38mm/sec with a 60Hz  notched filter applied as appropriate. EEG data were recorded continuously and digitally stored.  Video monitoring was available and reviewed as appropriate. Description: No posterior dominant rhythm was seen. Sleep was characterized by vertex waves, sleep spindles (12 to 14 Hz), maximal frontocentral region. There is an excessive amount of 13 to 13 Hz beta activity distributed symmetrically and diffusely.  Hyperventilation and photic  stimulation were not performed.    ABNORMALITY - Excessive beta, generalized  IMPRESSION: This study is within normal limits. The excessive beta activity seen in the background is most likely due to the effect of benzodiazepine and is a benign EEG pattern. No seizures or epileptiform discharges were seen throughout the recording.  A normal interictal EEG does not exclude the diagnosis of epilepsy.  Lora Havens   DG Chest Port 1 View  Result Date: 11/27/2022 CLINICAL DATA:  Concern for aspiration. EXAM: PORTABLE CHEST 1 VIEW COMPARISON:  Chest radiograph dated September 27, 2022. FINDINGS: The heart is mildly enlarged. Bilateral lower lobe hazy and reticular opacities, concerning for airspace disease, differential includes pulmonary edema or aspiration pneumonia. No pleural effusion or focal consolidation. No acute osseous abnormality. IMPRESSION: Bilateral lower lobe hazy and reticular opacities, concerning for airspace disease, differential includes pulmonary edema or aspiration pneumonia. Electronically Signed   By: Wyatt Mage  Ahmed D.O.   On: 11/27/2022 11:00   CT Head Wo Contrast  Result Date: 11/27/2022 CLINICAL DATA:  Blunt facial trauma.  Seizures. EXAM: CT HEAD WITHOUT CONTRAST CT MAXILLOFACIAL WITHOUT CONTRAST CT CERVICAL SPINE WITHOUT CONTRAST TECHNIQUE: Multidetector CT imaging of the head, cervical spine, and maxillofacial structures were performed using the standard protocol without intravenous contrast. Multiplanar CT image reconstructions of the cervical spine and maxillofacial structures were also generated. RADIATION DOSE REDUCTION: This exam was performed according to the departmental dose-optimization program which includes automated exposure control, adjustment of the mA and/or kV according to patient size and/or use of iterative reconstruction technique. COMPARISON:  09/27/2022 FINDINGS: CT HEAD FINDINGS Brain: Brain has normal appearance without evidence of atrophy, stroke, mass,  hemorrhage, hydrocephalus or extra-axial collection. Vascular: No abnormal vascular finding. Skull: No skull fracture. Other: None CT MAXILLOFACIAL FINDINGS Osseous: No acute facial fracture. Old/chronic/healed medial orbital blowout fracture on the right. Chronic in word bowing of the zygomatic arch on the left. Chronic dental decay. Orbits: No soft tissue orbital injury. Old medial blowout fracture on the right as noted above. Sinuses: No traumatic finding. Chronic inflammatory changes of the maxillary sinuses, right more than left. Small amount of layering fluid. Soft tissues: No significant soft tissue injury identified by CT. Bilateral tonsillar prominence, not primarily evaluated. CT CERVICAL SPINE FINDINGS Alignment: Straightening of the normal cervical lordosis Skull base and vertebrae: No regional fracture. Soft tissues and spinal canal: No traumatic soft tissue finding. Disc levels: The foramen magnum is widely patent. There is ordinary mild osteoarthritis of the C1-2 articulation but no encroachment upon the neural structures. Chronic degenerative spondylosis at C3-4, C4-5, C5-6 and C6-7. No apparent compressive bony canal or foraminal narrowing however. Chronic facet osteoarthritis at the C7-T1 level on the right. Upper chest: Negative Other: None IMPRESSION: HEAD CT: Normal. MAXILLOFACIAL CT: No acute facial fracture. Old/chronic/healed medial orbital blowout fracture on the right. Chronic bowing of the zygomatic arch on the left. CERVICAL SPINE CT: No acute or traumatic finding. Chronic degenerative changes as outlined above. Electronically Signed   By: Nelson Chimes M.D.   On: 11/27/2022 09:51   CT Cervical Spine Wo Contrast  Result Date: 11/27/2022 CLINICAL DATA:  Blunt facial trauma.  Seizures. EXAM: CT HEAD WITHOUT CONTRAST CT MAXILLOFACIAL WITHOUT CONTRAST CT CERVICAL SPINE WITHOUT CONTRAST TECHNIQUE: Multidetector CT imaging of the head, cervical spine, and maxillofacial structures were  performed using the standard protocol without intravenous contrast. Multiplanar CT image reconstructions of the cervical spine and maxillofacial structures were also generated. RADIATION DOSE REDUCTION: This exam was performed according to the departmental dose-optimization program which includes automated exposure control, adjustment of the mA and/or kV according to patient size and/or use of iterative reconstruction technique. COMPARISON:  09/27/2022 FINDINGS: CT HEAD FINDINGS Brain: Brain has normal appearance without evidence of atrophy, stroke, mass, hemorrhage, hydrocephalus or extra-axial collection. Vascular: No abnormal vascular finding. Skull: No skull fracture. Other: None CT MAXILLOFACIAL FINDINGS Osseous: No acute facial fracture. Old/chronic/healed medial orbital blowout fracture on the right. Chronic in word bowing of the zygomatic arch on the left. Chronic dental decay. Orbits: No soft tissue orbital injury. Old medial blowout fracture on the right as noted above. Sinuses: No traumatic finding. Chronic inflammatory changes of the maxillary sinuses, right more than left. Small amount of layering fluid. Soft tissues: No significant soft tissue injury identified by CT. Bilateral tonsillar prominence, not primarily evaluated. CT CERVICAL SPINE FINDINGS Alignment: Straightening of the normal cervical lordosis Skull base and vertebrae: No  regional fracture. Soft tissues and spinal canal: No traumatic soft tissue finding. Disc levels: The foramen magnum is widely patent. There is ordinary mild osteoarthritis of the C1-2 articulation but no encroachment upon the neural structures. Chronic degenerative spondylosis at C3-4, C4-5, C5-6 and C6-7. No apparent compressive bony canal or foraminal narrowing however. Chronic facet osteoarthritis at the C7-T1 level on the right. Upper chest: Negative Other: None IMPRESSION: HEAD CT: Normal. MAXILLOFACIAL CT: No acute facial fracture. Old/chronic/healed medial orbital  blowout fracture on the right. Chronic bowing of the zygomatic arch on the left. CERVICAL SPINE CT: No acute or traumatic finding. Chronic degenerative changes as outlined above. Electronically Signed   By: Nelson Chimes M.D.   On: 11/27/2022 09:51   CT Maxillofacial WO CM  Result Date: 11/27/2022 CLINICAL DATA:  Blunt facial trauma.  Seizures. EXAM: CT HEAD WITHOUT CONTRAST CT MAXILLOFACIAL WITHOUT CONTRAST CT CERVICAL SPINE WITHOUT CONTRAST TECHNIQUE: Multidetector CT imaging of the head, cervical spine, and maxillofacial structures were performed using the standard protocol without intravenous contrast. Multiplanar CT image reconstructions of the cervical spine and maxillofacial structures were also generated. RADIATION DOSE REDUCTION: This exam was performed according to the departmental dose-optimization program which includes automated exposure control, adjustment of the mA and/or kV according to patient size and/or use of iterative reconstruction technique. COMPARISON:  09/27/2022 FINDINGS: CT HEAD FINDINGS Brain: Brain has normal appearance without evidence of atrophy, stroke, mass, hemorrhage, hydrocephalus or extra-axial collection. Vascular: No abnormal vascular finding. Skull: No skull fracture. Other: None CT MAXILLOFACIAL FINDINGS Osseous: No acute facial fracture. Old/chronic/healed medial orbital blowout fracture on the right. Chronic in word bowing of the zygomatic arch on the left. Chronic dental decay. Orbits: No soft tissue orbital injury. Old medial blowout fracture on the right as noted above. Sinuses: No traumatic finding. Chronic inflammatory changes of the maxillary sinuses, right more than left. Small amount of layering fluid. Soft tissues: No significant soft tissue injury identified by CT. Bilateral tonsillar prominence, not primarily evaluated. CT CERVICAL SPINE FINDINGS Alignment: Straightening of the normal cervical lordosis Skull base and vertebrae: No regional fracture. Soft  tissues and spinal canal: No traumatic soft tissue finding. Disc levels: The foramen magnum is widely patent. There is ordinary mild osteoarthritis of the C1-2 articulation but no encroachment upon the neural structures. Chronic degenerative spondylosis at C3-4, C4-5, C5-6 and C6-7. No apparent compressive bony canal or foraminal narrowing however. Chronic facet osteoarthritis at the C7-T1 level on the right. Upper chest: Negative Other: None IMPRESSION: HEAD CT: Normal. MAXILLOFACIAL CT: No acute facial fracture. Old/chronic/healed medial orbital blowout fracture on the right. Chronic bowing of the zygomatic arch on the left. CERVICAL SPINE CT: No acute or traumatic finding. Chronic degenerative changes as outlined above. Electronically Signed   By: Nelson Chimes M.D.   On: 11/27/2022 09:51     Discharge Instructions: Discharge Instructions     Diet - low sodium heart healthy   Complete by: As directed    Diet - low sodium heart healthy   Complete by: As directed    Increase activity slowly   Complete by: As directed    Increase activity slowly   Complete by: As directed      You were admitted for a seizure. You were stabilized and started back on your home medications. You should take these as directed everyday to prevent further seizures. Your drug screen showed cocaine. You should stop using cocaine as it increases risk of seizures. It also increases risk of injury  to the brain, heart, and other organs. You should stop using alcohol which can also contribute to seizures. You should not drive until you have been seizure free for 6 months.  SEIZURE PRECAUTIONS Per [facility] statutes, patients with seizures are not allowed to drive until they have been seizure-free for six months.    Use caution when using heavy equipment or power tools. Avoid working on ladders or at heights. Take showers instead of baths. Ensure the water temperature is not too high on the home water heater. Do not go  swimming alone. Do not lock yourself in a room alone (i.e. bathroom). When caring for infants or small children, sit down when holding, feeding, or changing them to minimize risk of injury to the child in the event you have a seizure. Maintain good sleep hygiene. Avoid alcohol.    If patient has another seizure, call 911 and bring them back to the ED if: A.  The seizure lasts longer than 5 minutes.      B.  The patient doesn't wake shortly after the seizure or has new problems such as difficulty seeing, speaking or moving following the seizure C.  The patient was injured during the seizure D.  The patient has a temperature over 102 F (39C) E.  The patient vomited during the seizure and now is having trouble breathing  Signed: Iona Coach, MD 11/29/2022, 10:54 AM   Pager: Iona Coach, MD Internal Medicine Resident, PGY-1 [facility]  Pager: [phone]

## 2022-12-09 ENCOUNTER — Ambulatory Visit: Payer: Self-pay | Admitting: Surgery

## 2022-12-09 ENCOUNTER — Telehealth: Payer: Self-pay

## 2022-12-09 NOTE — Telephone Encounter (Signed)
   Pre-operative Risk Assessment    Patient Name: Theresa Rios  DOB: [birth year] MRN: [id number]      Request for Surgical Clearance    Procedure:   LEFT INGUINAL HERNIA   Date of Surgery:  Clearance TBD                                 Surgeon:  DR. Arville Go Surgeon's Group or Practice Name:  [facility] Phone number:  [phone]  Fax number:  [phone]   Type of Clearance Requested:   - Medical    Type of Anesthesia:  Not Indicated   Additional requests/questions:   PATIENT HAS NEVER BEEN SEEN BY CARDIOLOGY AND NEEDS A NEW PATIENT APPT.  Signed, Jacinta Shoe   12/09/2022, 5:10 PM

## 2022-12-10 NOTE — Telephone Encounter (Addendum)
New Information added. Possible right Inguinal Hernia Repair w/mesh.

## 2022-12-17 ENCOUNTER — Ambulatory Visit: Payer: No Typology Code available for payment source | Admitting: Internal Medicine

## 2022-12-17 NOTE — Progress Notes (Deleted)
Cardiology Office Note:    Date:  12/17/2022   ID:  Theresa Rios, DOB 27-Feb-1966, MRN [id number]  PCP:  Lavinia Sharps, NP   [facility] Providers Cardiologist:  None { Click to update primary MD,subspecialty MD or APP then REFRESH:1}    Referring MD: Karie Soda, MD   No chief complaint on file. ***  History of Present Illness:    EVYANNA Rios is a 57 y.o. female with a hx of depression, SI, no cardiac hx, smoker, referral for pre-op for R inguinal hernia repair w/ mesh. Referral from [facility]. She was recently in the hospital for seizures. This was thought 2/2 DTs with etoh abuse. Her UDS was +for cocaine.  Prior EKG shows LVH and sinus rhythm.   Past Medical History:  Diagnosis Date   Seizures (HCC)     No past surgical history on file.  Current Medications: No outpatient medications have been marked as taking for the 12/17/22 encounter (Appointment) with Maisie Fus, MD.     Allergies:   Patient has no known allergies.   Social History   Socioeconomic History   Marital status: Media planner    Spouse name: Not on file   Number of children: 0   Years of education: Not on file   Highest education level: Not on file  Occupational History   Not on file  Tobacco Use   Smoking status: Every Day    Packs/day: .5    Types: Cigarettes   Smokeless tobacco: Never  Vaping Use   Vaping Use: Never used  Substance and Sexual Activity   Alcohol use: Yes    Alcohol/week: 10.0 standard drinks of alcohol    Types: 10 Cans of beer per week    Comment: drinks Sundays- sometimes during the week   Drug use: Not Currently   Sexual activity: Not on file  Other Topics Concern   Not on file  Social History Narrative   Pt lives in [place] with a friend. Her husband is living with his mother. Pt recently lost her job and her place to stay.   Social Determinants of Health   Financial Resource Strain: Not on file  Food Insecurity: Food  Insecurity Present (11/27/2022)   Hunger Vital Sign    Worried About Running Out of Food in the Last Year: Sometimes true    Ran Out of Food in the Last Year: Sometimes true  Transportation Needs: Unmet Transportation Needs (11/27/2022)   PRAPARE - Administrator, Civil Service (Medical): Yes    Lack of Transportation (Non-Medical): Yes  Physical Activity: Not on file  Stress: Not on file  Social Connections: Not on file     Family History: The patient's ***family history is not on file.  ROS:   Please see the history of present illness.    *** All other systems reviewed and are negative.  EKGs/Labs/Other Studies Reviewed:    The following studies were reviewed today: ***  EKG:  EKG is *** ordered today.  The ekg ordered today demonstrates ***  Recent Labs: 08/01/2022: TSH 0.618 11/27/2022: ALT 18; Magnesium 1.8 11/28/2022: BUN <5; Creatinine, Ser 0.64; Hemoglobin 12.4; Platelets 193; Potassium 3.1; Sodium 140  Recent Lipid Panel    Component Value Date/Time   CHOL 245 (H) 09/01/2022 0635   TRIG 128 09/01/2022 0635   HDL 89 09/01/2022 0635   CHOLHDL 2.8 09/01/2022 0635   VLDL 26 09/01/2022 0635   LDLCALC 130 (H)  09/01/2022 4193     Risk Assessment/Calculations:   {Does this patient have ATRIAL FIBRILLATION?:[id number]}  No BP recorded.  {Refresh Note OR Click here to enter BP  :1}***         Physical Exam:    VS:  LMP 05/17/2014     Wt Readings from Last 3 Encounters:  11/27/22 165 lb 5.5 oz (75 kg)  10/01/22 161 lb (73 kg)  04/12/22 152 lb 1.9 oz (69 kg)     GEN: *** Well nourished, well developed in no acute distress HEENT: Normal NECK: No JVD; No carotid bruits LYMPHATICS: No lymphadenopathy CARDIAC: ***RRR, no murmurs, rubs, gallops RESPIRATORY:  Clear to auscultation without rales, wheezing or rhonchi  ABDOMEN: Soft, non-tender, non-distended MUSCULOSKELETAL:  No edema; No deformity  SKIN: Warm and dry NEUROLOGIC:  Alert and oriented x  3 PSYCHIATRIC:  Normal affect   ASSESSMENT:    No diagnosis found. PLAN:    In order of problems listed above:  ***      {Are you ordering a CV Procedure (e.g. stress test, cath, DCCV, TEE, etc)?   Press F2        :[id number]}    Medication Adjustments/Labs and Tests Ordered: Current medicines are reviewed at length with the patient today.  Concerns regarding medicines are outlined above.  No orders of the defined types were placed in this encounter.  No orders of the defined types were placed in this encounter.   There are no Patient Instructions on file for this visit.   Signed, Maisie Fus, MD  12/17/2022 7:53 AM    [facility]

## 2022-12-24 ENCOUNTER — Ambulatory Visit: Payer: Self-pay | Attending: Internal Medicine | Admitting: Internal Medicine

## 2022-12-24 NOTE — Progress Notes (Deleted)
Cardiology Office Note:    Date:  12/24/2022   ID:  SPENSER Rios, DOB 1966/06/14, MRN [id number]  PCP:  Lavinia Sharps, NP   [facility] Providers Cardiologist:  None { Click to update primary MD,subspecialty MD or APP then REFRESH:1}    Referring MD: Karie Soda, MD   No chief complaint on file. ***  History of Present Illness:    Theresa Rios is a 57 y.o. female with a hx of SI, MDD,  ETOH with DTS, no cardiac dx hx, planned for hernia procedure and pre-op eval is being requested.    Past Medical History:  Diagnosis Date   Seizures (HCC)     No past surgical history on file.  Current Medications: No outpatient medications have been marked as taking for the 12/24/22 encounter (Appointment) with Maisie Fus, MD.     Allergies:   Patient has no known allergies.   Social History   Socioeconomic History   Marital status: Media planner    Spouse name: Not on file   Number of children: 0   Years of education: Not on file   Highest education level: Not on file  Occupational History   Not on file  Tobacco Use   Smoking status: Every Day    Packs/day: .5    Types: Cigarettes   Smokeless tobacco: Never  Vaping Use   Vaping Use: Never used  Substance and Sexual Activity   Alcohol use: Yes    Alcohol/week: 10.0 standard drinks of alcohol    Types: 10 Cans of beer per week    Comment: drinks Sundays- sometimes during the week   Drug use: Not Currently   Sexual activity: Not on file  Other Topics Concern   Not on file  Social History Narrative   Pt lives in [place] with a friend. Her husband is living with his mother. Pt recently lost her job and her place to stay.   Social Determinants of Health   Financial Resource Strain: Not on file  Food Insecurity: Food Insecurity Present (11/27/2022)   Hunger Vital Sign    Worried About Running Out of Food in the Last Year: Sometimes true    Ran Out of Food in the Last Year: Sometimes true   Transportation Needs: Unmet Transportation Needs (11/27/2022)   PRAPARE - Administrator, Civil Service (Medical): Yes    Lack of Transportation (Non-Medical): Yes  Physical Activity: Not on file  Stress: Not on file  Social Connections: Not on file     Family History: The patient's ***family history is not on file.  ROS:   Please see the history of present illness.    *** All other systems reviewed and are negative.  EKGs/Labs/Other Studies Reviewed:    The following studies were reviewed today: ***  EKG:  EKG is *** ordered today.  The ekg ordered today demonstrates ***  Recent Labs: 08/01/2022: TSH 0.618 11/27/2022: ALT 18; Magnesium 1.8 11/28/2022: BUN <5; Creatinine, Ser 0.64; Hemoglobin 12.4; Platelets 193; Potassium 3.1; Sodium 140  Recent Lipid Panel    Component Value Date/Time   CHOL 245 (H) 09/01/2022 0635   TRIG 128 09/01/2022 0635   HDL 89 09/01/2022 0635   CHOLHDL 2.8 09/01/2022 0635   VLDL 26 09/01/2022 0635   LDLCALC 130 (H) 09/01/2022 0635     Risk Assessment/Calculations:   {Does this patient have ATRIAL FIBRILLATION?:[phone]}  No BP recorded.  {Refresh Note OR Click here to enter BP  :  1}***         Physical Exam:    VS:  LMP 05/17/2014     Wt Readings from Last 3 Encounters:  11/27/22 165 lb 5.5 oz (75 kg)  10/01/22 161 lb (73 kg)  04/12/22 152 lb 1.9 oz (69 kg)     GEN: *** Well nourished, well developed in no acute distress HEENT: Normal NECK: No JVD; No carotid bruits LYMPHATICS: No lymphadenopathy CARDIAC: ***RRR, no murmurs, rubs, gallops RESPIRATORY:  Clear to auscultation without rales, wheezing or rhonchi  ABDOMEN: Soft, non-tender, non-distended MUSCULOSKELETAL:  No edema; No deformity  SKIN: Warm and dry NEUROLOGIC:  Alert and oriented x 3 PSYCHIATRIC:  Normal affect   ASSESSMENT:    No diagnosis found. PLAN:    In order of problems listed above:  ***      {Are you ordering a CV Procedure (e.g.  stress test, cath, DCCV, TEE, etc)?   Press F2        :[id number]}    Medication Adjustments/Labs and Tests Ordered: Current medicines are reviewed at length with the patient today.  Concerns regarding medicines are outlined above.  No orders of the defined types were placed in this encounter.  No orders of the defined types were placed in this encounter.   There are no Patient Instructions on file for this visit.   Signed, Maisie Fus, MD  12/24/2022 1:58 PM    [facility]

## 2023-01-21 ENCOUNTER — Inpatient Hospital Stay (HOSPITAL_COMMUNITY)
Admission: EM | Admit: 2023-01-21 | Discharge: 2023-01-24 | DRG: 100 | Disposition: A | Discharge status: Home / Self Care | Payer: Medicaid Other | Attending: Internal Medicine | Admitting: Internal Medicine

## 2023-01-21 ENCOUNTER — Other Ambulatory Visit: Payer: Self-pay

## 2023-01-21 ENCOUNTER — Emergency Department (HOSPITAL_COMMUNITY): Payer: Medicaid Other

## 2023-01-21 ENCOUNTER — Encounter (HOSPITAL_COMMUNITY): Payer: Self-pay

## 2023-01-21 ENCOUNTER — Ambulatory Visit: Payer: No Typology Code available for payment source | Admitting: Internal Medicine

## 2023-01-21 DIAGNOSIS — Z87891 Personal history of nicotine dependence: Secondary | ICD-10-CM

## 2023-01-21 DIAGNOSIS — T426X6A Underdosing of other antiepileptic and sedative-hypnotic drugs, initial encounter: Secondary | ICD-10-CM | POA: Diagnosis present

## 2023-01-21 DIAGNOSIS — F1420 Cocaine dependence, uncomplicated: Secondary | ICD-10-CM

## 2023-01-21 DIAGNOSIS — Z91199 Patient's noncompliance with other medical treatment and regimen due to unspecified reason: Secondary | ICD-10-CM | POA: Diagnosis not present

## 2023-01-21 DIAGNOSIS — F1410 Cocaine abuse, uncomplicated: Secondary | ICD-10-CM | POA: Diagnosis present

## 2023-01-21 DIAGNOSIS — F32A Depression, unspecified: Secondary | ICD-10-CM | POA: Diagnosis present

## 2023-01-21 DIAGNOSIS — R569 Unspecified convulsions: Secondary | ICD-10-CM | POA: Diagnosis not present

## 2023-01-21 DIAGNOSIS — G40909 Epilepsy, unspecified, not intractable, without status epilepticus: Principal | ICD-10-CM | POA: Diagnosis present

## 2023-01-21 DIAGNOSIS — Z79899 Other long term (current) drug therapy: Secondary | ICD-10-CM

## 2023-01-21 DIAGNOSIS — F1010 Alcohol abuse, uncomplicated: Secondary | ICD-10-CM | POA: Diagnosis present

## 2023-01-21 DIAGNOSIS — Z7189 Other specified counseling: Secondary | ICD-10-CM

## 2023-01-21 DIAGNOSIS — G929 Unspecified toxic encephalopathy: Secondary | ICD-10-CM | POA: Diagnosis present

## 2023-01-21 DIAGNOSIS — Y92009 Unspecified place in unspecified non-institutional (private) residence as the place of occurrence of the external cause: Secondary | ICD-10-CM

## 2023-01-21 DIAGNOSIS — Z9151 Personal history of suicidal behavior: Secondary | ICD-10-CM

## 2023-01-21 DIAGNOSIS — Z91128 Patient's intentional underdosing of medication regimen for other reason: Secondary | ICD-10-CM

## 2023-01-21 HISTORY — DX: Cocaine dependence, uncomplicated: F14.20

## 2023-01-21 LAB — I-STAT CHEM 8, ED
BUN: 7 mg/dL (ref 6–20)
Calcium, Ion: 0.98 mmol/L — ABNORMAL LOW (ref 1.15–1.40)
Chloride: 105 mmol/L (ref 98–111)
Creatinine, Ser: 0.5 mg/dL (ref 0.44–1.00)
Glucose, Bld: 170 mg/dL — ABNORMAL HIGH (ref 70–99)
HCT: 46 % (ref 36.0–46.0)
Hemoglobin: 15.6 g/dL — ABNORMAL HIGH (ref 12.0–15.0)
Potassium: 5.7 mmol/L — ABNORMAL HIGH (ref 3.5–5.1)
Sodium: 135 mmol/L (ref 135–145)
TCO2: 20 mmol/L — ABNORMAL LOW (ref 22–32)

## 2023-01-21 LAB — CBC WITH DIFFERENTIAL/PLATELET
Abs Immature Granulocytes: 0.11 10*3/uL — ABNORMAL HIGH (ref 0.00–0.07)
Basophils Absolute: 0.1 10*3/uL (ref 0.0–0.1)
Basophils Relative: 0 %
Eosinophils Absolute: 0 10*3/uL (ref 0.0–0.5)
Eosinophils Relative: 0 %
HCT: 45.5 % (ref 36.0–46.0)
Hemoglobin: 14 g/dL (ref 12.0–15.0)
Immature Granulocytes: 1 %
Lymphocytes Relative: 16 %
Lymphs Abs: 2.1 10*3/uL (ref 0.7–4.0)
MCH: 27.6 pg (ref 26.0–34.0)
MCHC: 30.8 g/dL (ref 30.0–36.0)
MCV: 89.6 fL (ref 80.0–100.0)
Monocytes Absolute: 0.8 10*3/uL (ref 0.1–1.0)
Monocytes Relative: 6 %
Neutro Abs: 10.3 10*3/uL — ABNORMAL HIGH (ref 1.7–7.7)
Neutrophils Relative %: 77 %
Platelets: 222 10*3/uL (ref 150–400)
RBC: 5.08 MIL/uL (ref 3.87–5.11)
RDW: 15.7 % — ABNORMAL HIGH (ref 11.5–15.5)
WBC: 13.4 10*3/uL — ABNORMAL HIGH (ref 4.0–10.5)
nRBC: 0 % (ref 0.0–0.2)

## 2023-01-21 LAB — ETHANOL: Alcohol, Ethyl (B): <10 mg/dL (ref <10)

## 2023-01-21 LAB — VALPROIC ACID LEVEL: Valproic Acid Lvl: <10 ug/mL — ABNORMAL LOW (ref 50.0–100.0)

## 2023-01-21 MED ORDER — VALPROATE SODIUM 100 MG/ML IV SOLN
500.0000 mg | Freq: Once | INTRAVENOUS | Status: AC
  Administered 2023-01-21: 500 mg via INTRAVENOUS
  Filled 2023-01-21: qty 5

## 2023-01-21 MED ORDER — LORAZEPAM 2 MG/ML IJ SOLN
2.0000 mg | Freq: Once | INTRAMUSCULAR | Status: AC
  Administered 2023-01-21: 2 mg via INTRAVENOUS
  Filled 2023-01-21: qty 1

## 2023-01-21 MED ORDER — LORAZEPAM 2 MG/ML IJ SOLN
2.0000 mg | Freq: Once | INTRAMUSCULAR | Status: AC
  Administered 2023-01-21: 1 mg via INTRAVENOUS
  Filled 2023-01-21: qty 1

## 2023-01-21 MED ORDER — SODIUM CHLORIDE 0.9 % IV BOLUS
1000.0000 mL | Freq: Once | INTRAVENOUS | Status: AC
  Administered 2023-01-21: 1000 mL via INTRAVENOUS

## 2023-01-21 NOTE — Assessment & Plan Note (Signed)
Repeated UDS positive for cocaine. Could be the cause of her lower seizure threshold

## 2023-01-21 NOTE — Subjective & Objective (Signed)
CC: seizure HPI: 57 year old African-American female history of seizure disorder, chronic cocaine abuse, history of alcohol use, history of depression presents to the ER today with repeated see sugars.  She had a seizure at home, another seizure in the ambulance and 2 witnessed seizures in the ER.  She has been given multiple doses of IV Ativan here.  Her Depakote level was nonexistent.  Patient is currently postictal.  Unable to give history review of systems.  Her urine drug screens have all been positive for cocaine in the last year.  Current urine drug screen is pending.  CT head without acute intracranial abnormality.  [facility] hospitalist contacted for admission.

## 2023-01-21 NOTE — H&P (Signed)
History and Physical    Theresa Rios BJY:[id number] DOB: 1966-01-24 DOA: 01/21/2023  DOS: the patient was seen and examined on 01/21/2023  PCP: Lavinia Sharps, NP   Patient coming from: Home  I have personally briefly reviewed patient's old medical records in [facility]  CC: seizure HPI: 57 year old African-American female history of seizure disorder, chronic cocaine abuse, history of alcohol use, history of depression presents to the ER today with repeated see sugars.  She had a seizure at home, another seizure in the ambulance and 2 witnessed seizures in the ER.  She has been given multiple doses of IV Ativan here.  Her Depakote level was nonexistent.  Patient is currently postictal.  Unable to give history review of systems.  Her urine drug screens have all been positive for cocaine in the last year.  Current urine drug screen is pending.  CT head without acute intracranial abnormality.  [facility] hospitalist contacted for admission.   ED Course: 2 seizures in ER. Depakote level nonexistent. CT head negative.  Review of Systems:  Review of Systems  Unable to perform ROS: Other  Post-ictal  Past Medical History:  Diagnosis Date   Seizures (HCC)    Suicide attempt by drug ingestion (HCC) 03/19/2022    History reviewed. No pertinent surgical history.   reports that she has been smoking cigarettes. She has been smoking an average of .5 packs per day. She has never used smokeless tobacco. She reports current alcohol use of about 10.0 standard drinks of alcohol per week. She reports that she does not currently use drugs.  No Known Allergies  History reviewed. No pertinent family history.  Prior to Admission medications   Medication Sig Start Date End Date Taking? Authorizing Provider  divalproex (DEPAKOTE) 500 MG DR tablet Take 1 tablet (500 mg total) by mouth every 12 (twelve) hours. 11/29/22   Willette Cluster, MD  gabapentin (NEURONTIN) 300 MG capsule Take 1 capsule  (300 mg total) by mouth 2 (two) times daily. 09/28/22   Marrianne Mood, MD  nicotine polacrilex (NICORETTE) 2 MG gum Take 1 each (2 mg total) by mouth as needed for smoking cessation. Patient not taking: Reported on 09/28/2022 09/06/22   Starleen Blue, NP  OLANZapine zydis (ZYPREXA) 5 MG disintegrating tablet Take 1 tablet (5 mg total) by mouth at bedtime. 09/06/22   Starleen Blue, NP  sertraline (ZOLOFT) 50 MG tablet Take 1 tablet (50 mg total) by mouth daily. 09/07/22   Starleen Blue, NP  traZODone (DESYREL) 50 MG tablet Take 1 tablet (50 mg total) by mouth at bedtime as needed for sleep. 09/06/22   Starleen Blue, NP    Physical Exam: Vitals:   01/21/23 2009 01/21/23 2020 01/21/23 2030 01/21/23 2055  BP: (!) 179/103  (!) 172/90 (!) 173/84  Pulse: 73  89 70  Resp: 18  20 (!) 21  Temp: (!) 97.3 F (36.3 C)     SpO2: 100%  100% 100%  Weight:  75 kg    Height:  5\' 4"  (1.626 m)      Physical Exam Vitals and nursing note reviewed.  Constitutional:      General: She is not in acute distress.    Appearance: She is obese.     Comments: Post-ictal. Moves all extremities spontaneously. But not to command  HENT:     Head: Normocephalic and atraumatic.  Eyes:     Pupils: Pupils are equal, round, and reactive to light.  Cardiovascular:     Rate  and Rhythm: Normal rate and regular rhythm.  Pulmonary:     Effort: Pulmonary effort is normal.  Abdominal:     General: Bowel sounds are normal. There is no distension.  Musculoskeletal:     Right lower leg: No edema.     Left lower leg: No edema.  Skin:    General: Skin is warm and dry.  Neurological:     Comments: Unarousable. Does not follow commands. Moves all extremities spontaneously.      Labs on Admission: I have personally reviewed following labs and imaging studies  CBC: Recent Labs  Lab 01/21/23 2022 01/21/23 2200  WBC 13.4*  --   NEUTROABS 10.3*  --   HGB 14.0 15.6*  HCT 45.5 46.0  MCV 89.6  --   PLT 222  --     Basic Metabolic Panel: Recent Labs  Lab 01/21/23 2200  NA 135  K 5.7*  CL 105  GLUCOSE 170*  BUN 7  CREATININE 0.50   GFR: Estimated Creatinine Clearance: 76.9 mL/min (by C-G formula based on SCr of 0.5 mg/dL). Liver Function Tests: No results for input(s): "AST", "ALT", "ALKPHOS", "BILITOT", "PROT", "ALBUMIN" in the last 168 hours. No results for input(s): "LIPASE", "AMYLASE" in the last 168 hours. No results for input(s): "AMMONIA" in the last 168 hours. Coagulation Profile: No results for input(s): "INR", "PROTIME" in the last 168 hours. Cardiac Enzymes: No results for input(s): "CKTOTAL", "CKMB", "CKMBINDEX", "TROPONINI", "TROPONINIHS" in the last 168 hours. BNP (last 3 results) No results for input(s): "BNP" in the last 8760 hours. HbA1C: No results for input(s): "HGBA1C" in the last 72 hours. CBG: No results for input(s): "GLUCAP" in the last 168 hours. Lipid Profile: No results for input(s): "CHOL", "HDL", "LDLCALC", "TRIG", "CHOLHDL", "LDLDIRECT" in the last 72 hours. Thyroid Function Tests: No results for input(s): "TSH", "T4TOTAL", "FREET4", "T3FREE", "THYROIDAB" in the last 72 hours. Anemia Panel: No results for input(s): "VITAMINB12", "FOLATE", "FERRITIN", "TIBC", "IRON", "RETICCTPCT" in the last 72 hours. Urine analysis:    Component Value Date/Time   COLORURINE YELLOW 11/27/2022 1053   APPEARANCEUR CLEAR 11/27/2022 1053   LABSPEC 1.013 11/27/2022 1053   PHURINE 5.0 11/27/2022 1053   GLUCOSEU NEGATIVE 11/27/2022 1053   HGBUR NEGATIVE 11/27/2022 1053   BILIRUBINUR NEGATIVE 11/27/2022 1053   KETONESUR NEGATIVE 11/27/2022 1053   PROTEINUR NEGATIVE 11/27/2022 1053   UROBILINOGEN 1.0 10/26/2008 1226   NITRITE NEGATIVE 11/27/2022 1053   LEUKOCYTESUR MODERATE (A) 11/27/2022 1053    Radiological Exams on Admission: I have personally reviewed images CT HEAD WO CONTRAST ( )  Result Date: 01/21/2023 CLINICAL DATA:  New onset seizure activity EXAM: CT HEAD  WITHOUT CONTRAST TECHNIQUE: Contiguous axial images were obtained from the base of the skull through the vertex without intravenous contrast. RADIATION DOSE REDUCTION: This exam was performed according to the departmental dose-optimization program which includes automated exposure control, adjustment of the mA and/or kV according to patient size and/or use of iterative reconstruction technique. COMPARISON:  11/27/2022 FINDINGS: Brain: No evidence of acute infarction, hemorrhage, hydrocephalus, extra-axial collection or mass lesion/mass effect. Vascular: No hyperdense vessel or unexpected calcification. Skull: Normal. Negative for fracture or focal lesion. Sinuses/Orbits: No acute finding. Other: None. IMPRESSION: No acute intracranial abnormality noted. Electronically Signed   By: Alcide Clever M.D.   On: 01/21/2023 21:29    EKG: My personal interpretation of EKG shows: NSR    Assessment/Plan Principal Problem:   Seizure [facility]) Active Problems:   Cocaine abuse (HCC)   Non compliance with  medical treatment    Assessment and Plan: * Seizure (HCC) Observation med/tele bed. Continue with IV depakote. Keep npo. Neurology consult in AM.  Non compliance with medical treatment Depakote level non-existent. Pt is suppose to be on scheduled depakote. Clearly she is not taking it.  Cocaine abuse (HCC) Repeated UDS positive for cocaine. Could be the cause of her lower seizure threshold   DVT prophylaxis: SQ Heparin Code Status: Full Code. By default Family Communication: no family at bedside  Disposition Plan: return home  Consults called: EDP discussed case with Dr. Otelia Limes  Admission status: Observation, Telemetry bed   Carollee Herter, DO [facility] 01/21/2023, 10:38 PM

## 2023-01-21 NOTE — Assessment & Plan Note (Signed)
Observation med/tele bed. Continue with IV depakote. Keep npo. Neurology consult in AM.

## 2023-01-21 NOTE — ED Triage Notes (Signed)
BIB EMS from pt father's house. Pt had unwitnessed seizure and had incontinence episode on the couch and was orientedx1, pt was post ictal when EMS arrived. EMS reports she became A&Ox3 before having a witnessed 15 second seizure and is now post ictal again. Unsure if pt has been compliant with Depakote.

## 2023-01-21 NOTE — Assessment & Plan Note (Signed)
Depakote level non-existent. Pt is suppose to be on scheduled depakote. Clearly she is not taking it.

## 2023-01-21 NOTE — ED Notes (Signed)
Orders placed for continuous monitoring, pt has pulled leads off numerous of times. Unable to in and out cath pt at the time.

## 2023-01-21 NOTE — ED Provider Notes (Signed)
[place] EMERGENCY DEPARTMENT AT [facility] Provider Note   CSN: [id number] Arrival date & time: 01/21/23  1959     History  Chief Complaint  Patient presents with   Seizures    Theresa Rios is a 57 y.o. female history of seizure with medication noncompliance, polysubstance abuse, here presenting with seizures.  Patient has a history of seizures and is known to be noncompliant with her Depakote.  Patient apparently had a seizure at home today.  Patient was brought to the ER and had another witnessed seizure on arrival.  Patient was recently admitted for seizure and was thought to be a combination of medication noncompliance as well as alcohol withdrawal.  Patient was post ictal on arrival and unable to give history   The history is provided by the EMS personnel.       Home Medications Prior to Admission medications   Medication Sig Start Date End Date Taking? Authorizing Provider  divalproex (DEPAKOTE) 500 MG DR tablet Take 1 tablet (500 mg total) by mouth every 12 (twelve) hours. 11/29/22   Willette Cluster, MD  gabapentin (NEURONTIN) 300 MG capsule Take 1 capsule (300 mg total) by mouth 2 (two) times daily. 09/28/22   Marrianne Mood, MD  nicotine polacrilex (NICORETTE) 2 MG gum Take 1 each (2 mg total) by mouth as needed for smoking cessation. Patient not taking: Reported on 09/28/2022 09/06/22   Starleen Blue, NP  OLANZapine zydis (ZYPREXA) 5 MG disintegrating tablet Take 1 tablet (5 mg total) by mouth at bedtime. 09/06/22   Starleen Blue, NP  sertraline (ZOLOFT) 50 MG tablet Take 1 tablet (50 mg total) by mouth daily. 09/07/22   Starleen Blue, NP  traZODone (DESYREL) 50 MG tablet Take 1 tablet (50 mg total) by mouth at bedtime as needed for sleep. 09/06/22   Starleen Blue, NP      Allergies    Patient has no known allergies.    Review of Systems   Review of Systems  Neurological:  Positive for seizures.  All other systems reviewed and are  negative.   Physical Exam Updated Vital Signs BP (!) 173/84   Pulse 70   Temp (!) 97.3 F (36.3 C)   Resp (!) 21   Ht 5\' 4"  (1.626 m)   Wt 75 kg   LMP 05/17/2014   SpO2 100%   BMI 28.38 kg/m  Physical Exam Vitals and nursing note reviewed.  Constitutional:      Comments: Postictal and confused  HENT:     Head: Normocephalic.     Nose: Nose normal.     Mouth/Throat:     Mouth: Mucous membranes are moist.  Eyes:     Comments: No eye deviation  Cardiovascular:     Rate and Rhythm: Regular rhythm. Tachycardia present.     Pulses: Normal pulses.     Heart sounds: Normal heart sounds.  Pulmonary:     Effort: Pulmonary effort is normal.     Breath sounds: Normal breath sounds.  Abdominal:     General: Abdomen is flat.     Palpations: Abdomen is soft.  Musculoskeletal:        General: Normal range of motion.     Cervical back: Normal range of motion and neck supple.  Skin:    General: Skin is warm.     Capillary Refill: Capillary refill takes less than 2 seconds.  Neurological:     Comments: Postictal and moving all extremities  Psychiatric:  Comments: Unable      ED Results / Procedures / Treatments   Labs (all labs ordered are listed, but only abnormal results are displayed) Labs Reviewed  CBC WITH DIFFERENTIAL/PLATELET - Abnormal; Notable for the following components:      Result Value   WBC 13.4 (*)    RDW 15.7 (*)    Neutro Abs 10.3 (*)    Abs Immature Granulocytes 0.11 (*)    All other components within normal limits  VALPROIC ACID LEVEL - Abnormal; Notable for the following components:   Valproic Acid Lvl <10 (*)    All other components within normal limits  I-STAT CHEM 8, ED - Abnormal; Notable for the following components:   Potassium 5.7 (*)    Glucose, Bld 170 (*)    Calcium, Ion 0.98 (*)    TCO2 20 (*)    Hemoglobin 15.6 (*)    All other components within normal limits  ETHANOL  RAPID URINE DRUG SCREEN, HOSP PERFORMED  COMPREHENSIVE  METABOLIC PANEL  MAGNESIUM    EKG EKG Interpretation  Date/Time:  Tuesday Jan 21 2023 20:28:01 EDT Ventricular Rate:  92 PR Interval:  160 QRS Duration: 96 QT Interval:  388 QTC Calculation: 480 R Axis:   -8 Text Interpretation: Sinus rhythm Left ventricular hypertrophy No significant change since last tracing Confirmed by Richardean Canal [phone]) on 01/21/2023 8:29:40 PM  Radiology CT HEAD WO CONTRAST ( )  Result Date: 01/21/2023 CLINICAL DATA:  New onset seizure activity EXAM: CT HEAD WITHOUT CONTRAST TECHNIQUE: Contiguous axial images were obtained from the base of the skull through the vertex without intravenous contrast. RADIATION DOSE REDUCTION: This exam was performed according to the departmental dose-optimization program which includes automated exposure control, adjustment of the mA and/or kV according to patient size and/or use of iterative reconstruction technique. COMPARISON:  11/27/2022 FINDINGS: Brain: No evidence of acute infarction, hemorrhage, hydrocephalus, extra-axial collection or mass lesion/mass effect. Vascular: No hyperdense vessel or unexpected calcification. Skull: Normal. Negative for fracture or focal lesion. Sinuses/Orbits: No acute finding. Other: None. IMPRESSION: No acute intracranial abnormality noted. Electronically Signed   By: Alcide Clever M.D.   On: 01/21/2023 21:29    Procedures Procedures    CRITICAL CARE Performed by: Richardean Canal   Total critical care time: 38 minutes  Critical care time was exclusive of separately billable procedures and treating other patients.  Critical care was necessary to treat or prevent imminent or life-threatening deterioration.  Critical care was time spent personally by me on the following activities: development of treatment plan with patient and/or surrogate as well as nursing, discussions with consultants, evaluation of patient's response to treatment, examination of patient, obtaining history from patient or  surrogate, ordering and performing treatments and interventions, ordering and review of laboratory studies, ordering and review of radiographic studies, pulse oximetry and re-evaluation of patient's condition.   Medications Ordered in ED Medications  valproate (DEPACON) 500 mg in dextrose 5 % 50 mL IVPB (0 mg Intravenous Stopped 01/21/23 2136)  sodium chloride 0.9 % bolus 1,000 mL (0 mLs Intravenous Stopped 01/21/23 2136)  LORazepam (ATIVAN) injection 2 mg (2 mg Intravenous Given 01/21/23 2136)  LORazepam (ATIVAN) injection 2 mg (1 mg Intravenous Given 01/21/23 2201)    ED Course/ Medical Decision Making/ A&P                             Medical Decision Making SHAVONTA ZAMBONI is a 57  y.o. female here presenting with seizure.  Patient has a known seizure disorder and is noncompliant with her meds.  Will check a Depakote level but will also load with IV Depakote.  Will check electrolytes and CT head.  Will consult neurology.  9:53 PM Patient's Depakote level is less than 10.  CT head unremarkable.  I discussed case with Dr. Otelia Limes from neurology.  He reviewed her case and felt that patient has persistent seizures from medication noncompliance.  He does recommend CIWA protocol and EEG if she has another seizure.  He states that hospitalist can consult neurology if she has another seizure.  10:19 PM She had another seizure and ordered Ativan and patient now is very sedated.  At this point hospitalist to admit and will transfer to [facility] since she had about 4 seizures today.   Problems Addressed: Seizure [facility]): acute illness or injury  Amount and/or Complexity of Data Reviewed Labs: ordered. Decision-making details documented in ED Course. Radiology: ordered and independent interpretation performed. Decision-making details documented in ED Course. ECG/medicine tests: ordered and independent interpretation performed. Decision-making details documented in ED Course.  Risk Prescription drug  management. Decision regarding hospitalization.    Final Clinical Impression(s) / ED Diagnoses Final diagnoses:  Seizure [facility])    Rx / DC Orders ED Discharge Orders     None         Charlynne Pander, MD 01/21/23 2220

## 2023-01-21 NOTE — ED Notes (Signed)
Ativan was given. Recollect unsuccessful due to pt combative nature. Physician ordered 1 of ativan.

## 2023-01-21 NOTE — ED Notes (Signed)
Pt had another witnessed seizure lasting 10-15 seconds. Pt turned on side and suctioned. Dr. Silverio Lay notified. Meds not given, by the time they were pulled pt was done seizing and Dr. Silverio Lay stated to hold meds.

## 2023-01-21 NOTE — ED Notes (Signed)
Nurse found pt foaming from the mouth. Pt was unresponsive to name suction was applied. Pt became combative with staff while attempted to recollect labs. Physician ordered 2mg  ativan.

## 2023-01-22 DIAGNOSIS — Z79899 Other long term (current) drug therapy: Secondary | ICD-10-CM | POA: Diagnosis not present

## 2023-01-22 DIAGNOSIS — F32A Depression, unspecified: Secondary | ICD-10-CM | POA: Diagnosis present

## 2023-01-22 DIAGNOSIS — Z91199 Patient's noncompliance with other medical treatment and regimen due to unspecified reason: Secondary | ICD-10-CM | POA: Diagnosis not present

## 2023-01-22 DIAGNOSIS — Z91128 Patient's intentional underdosing of medication regimen for other reason: Secondary | ICD-10-CM | POA: Diagnosis not present

## 2023-01-22 DIAGNOSIS — G929 Unspecified toxic encephalopathy: Secondary | ICD-10-CM | POA: Diagnosis present

## 2023-01-22 DIAGNOSIS — F1010 Alcohol abuse, uncomplicated: Secondary | ICD-10-CM | POA: Diagnosis present

## 2023-01-22 DIAGNOSIS — F1410 Cocaine abuse, uncomplicated: Secondary | ICD-10-CM | POA: Diagnosis present

## 2023-01-22 DIAGNOSIS — Z7189 Other specified counseling: Secondary | ICD-10-CM | POA: Diagnosis not present

## 2023-01-22 DIAGNOSIS — T426X6A Underdosing of other antiepileptic and sedative-hypnotic drugs, initial encounter: Secondary | ICD-10-CM | POA: Diagnosis present

## 2023-01-22 DIAGNOSIS — Z9151 Personal history of suicidal behavior: Secondary | ICD-10-CM | POA: Diagnosis not present

## 2023-01-22 DIAGNOSIS — Y92009 Unspecified place in unspecified non-institutional (private) residence as the place of occurrence of the external cause: Secondary | ICD-10-CM | POA: Diagnosis not present

## 2023-01-22 DIAGNOSIS — R569 Unspecified convulsions: Secondary | ICD-10-CM

## 2023-01-22 DIAGNOSIS — G40909 Epilepsy, unspecified, not intractable, without status epilepticus: Secondary | ICD-10-CM | POA: Diagnosis present

## 2023-01-22 DIAGNOSIS — Z87891 Personal history of nicotine dependence: Secondary | ICD-10-CM | POA: Diagnosis not present

## 2023-01-22 LAB — RAPID URINE DRUG SCREEN, HOSP PERFORMED
Amphetamines: NOT DETECTED
Barbiturates: NOT DETECTED
Benzodiazepines: POSITIVE — AB
Cocaine: POSITIVE — AB
Opiates: NOT DETECTED
Tetrahydrocannabinol: NOT DETECTED

## 2023-01-22 LAB — COMPREHENSIVE METABOLIC PANEL
ALT: 17 U/L (ref 0–44)
AST: 19 U/L (ref 15–41)
Albumin: 3.7 g/dL (ref 3.5–5.0)
Alkaline Phosphatase: 67 U/L (ref 38–126)
Anion gap: 11 (ref 5–15)
BUN: <5 mg/dL — ABNORMAL LOW (ref 6–20)
CO2: 26 mmol/L (ref 22–32)
Calcium: 9.1 mg/dL (ref 8.9–10.3)
Chloride: 99 mmol/L (ref 98–111)
Creatinine, Ser: 0.58 mg/dL (ref 0.44–1.00)
GFR, Estimated: >60 mL/min (ref >60)
Glucose, Bld: 112 mg/dL — ABNORMAL HIGH (ref 70–99)
Potassium: 3.3 mmol/L — ABNORMAL LOW (ref 3.5–5.1)
Sodium: 136 mmol/L (ref 135–145)
Total Bilirubin: 0.7 mg/dL (ref 0.3–1.2)
Total Protein: 7.2 g/dL (ref 6.5–8.1)

## 2023-01-22 LAB — MAGNESIUM: Magnesium: 2.4 mg/dL (ref 1.7–2.4)

## 2023-01-22 MED ORDER — FOLIC ACID 1 MG PO TABS
1.0000 mg | ORAL_TABLET | Freq: Every day | ORAL | Status: DC
  Administered 2023-01-22 – 2023-01-24 (×3): 1 mg via ORAL
  Filled 2023-01-22 (×3): qty 1

## 2023-01-22 MED ORDER — ACETAMINOPHEN 650 MG RE SUPP: 650.0000 mg | RECTAL | Status: DC | PRN

## 2023-01-22 MED ORDER — THIAMINE HCL 100 MG/ML IJ SOLN
500.0000 mg | Freq: Every day | INTRAVENOUS | Status: DC
  Administered 2023-01-22 – 2023-01-23 (×2): 500 mg via INTRAVENOUS
  Filled 2023-01-22 (×3): qty 5

## 2023-01-22 MED ORDER — LORAZEPAM 2 MG/ML IJ SOLN: 1.0000 mg | INTRAMUSCULAR | Status: DC | PRN

## 2023-01-22 MED ORDER — DEXTROSE IN LACTATED RINGERS 5 % IV SOLN: INTRAVENOUS | Status: DC

## 2023-01-22 MED ORDER — HEPARIN SODIUM (PORCINE) 5000 UNIT/ML IJ SOLN
5000.0000 [IU] | Freq: Three times a day (TID) | INTRAMUSCULAR | Status: DC
  Administered 2023-01-22 – 2023-01-24 (×7): 5000 [IU] via SUBCUTANEOUS
  Filled 2023-01-22 (×7): qty 1

## 2023-01-22 MED ORDER — LORAZEPAM 1 MG PO TABS: 1.0000 mg | ORAL_TABLET | ORAL | Status: DC | PRN

## 2023-01-22 MED ORDER — VALPROATE SODIUM 100 MG/ML IV SOLN
500.0000 mg | Freq: Two times a day (BID) | INTRAVENOUS | Status: DC
  Administered 2023-01-22 – 2023-01-24 (×5): 500 mg via INTRAVENOUS
  Filled 2023-01-22 (×7): qty 5

## 2023-01-22 MED ORDER — POTASSIUM CHLORIDE CRYS ER 20 MEQ PO TBCR
40.0000 meq | EXTENDED_RELEASE_TABLET | Freq: Once | ORAL | Status: AC
  Administered 2023-01-22: 40 meq via ORAL
  Filled 2023-01-22: qty 2

## 2023-01-22 MED ORDER — ADULT MULTIVITAMIN W/MINERALS CH
1.0000 | ORAL_TABLET | Freq: Every day | ORAL | Status: DC
  Administered 2023-01-22 – 2023-01-24 (×3): 1 via ORAL
  Filled 2023-01-22 (×3): qty 1

## 2023-01-22 NOTE — Progress Notes (Signed)
PROGRESS NOTE    Theresa Rios  ZOX:[id number] DOB: 1966/04/08 DOA: 01/21/2023 PCP: Lavinia Sharps, NP    Chief Complaint  Patient presents with   Seizures    Brief Narrative:   57 year old African-American female history of seizure disorder, chronic cocaine abuse, history of alcohol use, history of depression presents to the ER today with repeated see sugars.  She had a seizure at home, another seizure in the ambulance and 2 witnessed seizures in the ER.  Was noted postictal upon admission, she remains confused and somnolent this morning .   Her urine drug screens have all been positive for cocaine in the last year.   CT head without acute intracranial abnormality.     Assessment & Plan:   Principal Problem:   Seizure (HCC) Active Problems:   Cocaine abuse (HCC)   Non compliance with medical treatment  Seizure (HCC) -Presents with seizures, multiple episodes requiring IV Ativan, this is very likely due to noncompliance with her medications Depakote, valproic acid level was undetectable. -Her cocaine abuse, alcohol abuse has been contributing as well. -Remains altered this morning, somnolent, either related to postictal status versus IV Ativan she received while in ED, keep her n.p.o., Rande Lawman keep on IV Depakote   Non compliance with medical treatment Depakote level non-existent. Pt is suppose to be on scheduled depakote. Clearly she is not taking it.   Cocaine abuse (HCC) Repeated UDS positive for cocaine. Could be the cause of her lower seizure threshold  Alcohol abuse.   - will start on CIWA protocol    DVT prophylaxis: Lovenox Code Status: (Full) Family Communication: None at bedside  Status is: Inpatient    Consultants:  none   Subjective:  SHe is unable to provide any complaints, given her confusion, has confused and somnolent, she is confused per staff, kept pulling of her leads numerous times overnight. Objective: Vitals:   01/22/23 0145  01/22/23 0230 01/22/23 0401 01/22/23 0818  BP: (!) 154/82 (!) 162/96 136/69 (!) 152/87  Pulse: (!) 107 95 94 92  Resp: (!) 59 (!) 28 (!) 25 15  Temp:  98.2 F (36.8 C) 98.1 F (36.7 C) 99.3 F (37.4 C)  TempSrc:  Axillary Oral   SpO2: 95% 94% 94% 95%  Weight:      Height:        Intake/Output Summary (Last 24 hours) at 01/22/2023 1131 Last data filed at 01/22/2023 0600 Gross per 24 hour  Intake 185 ml  Output 200 ml  Net -15 ml   Filed Weights   01/21/23 2020  Weight: 75 kg    Examination:  Alert, but arousable, oriented to year and name, otherwise remains confused. Symmetrical Chest wall movement, Good air movement bilaterally, CTAB RRR,No Gallops,Rubs or new Murmurs, No Parasternal Heave +ve B.Sounds, Abd Soft, No tenderness, No rebound - guarding or rigidity. No Cyanosis, Clubbing or edema, No new Rash or bruise      Data Reviewed: I have personally reviewed following labs and imaging studies  CBC: Recent Labs  Lab 01/21/23 2022 01/21/23 2200  WBC 13.4*  --   NEUTROABS 10.3*  --   HGB 14.0 15.6*  HCT 45.5 46.0  MCV 89.6  --   PLT 222  --     Basic Metabolic Panel: Recent Labs  Lab 01/21/23 2200  NA 135  K 5.7*  CL 105  GLUCOSE 170*  BUN 7  CREATININE 0.50    GFR: Estimated Creatinine Clearance: 76.9 mL/min (by C-G formula based  on SCr of 0.5 mg/dL).  Liver Function Tests: No results for input(s): "AST", "ALT", "ALKPHOS", "BILITOT", "PROT", "ALBUMIN" in the last 168 hours.  CBG: No results for input(s): "GLUCAP" in the last 168 hours.   No results found for this or any previous visit (from the past 240 hour(s)).       Radiology Studies: CT HEAD WO CONTRAST ( )  Result Date: 01/21/2023 CLINICAL DATA:  New onset seizure activity EXAM: CT HEAD WITHOUT CONTRAST TECHNIQUE: Contiguous axial images were obtained from the base of the skull through the vertex without intravenous contrast. RADIATION DOSE REDUCTION: This exam was performed  according to the departmental dose-optimization program which includes automated exposure control, adjustment of the mA and/or kV according to patient size and/or use of iterative reconstruction technique. COMPARISON:  11/27/2022 FINDINGS: Brain: No evidence of acute infarction, hemorrhage, hydrocephalus, extra-axial collection or mass lesion/mass effect. Vascular: No hyperdense vessel or unexpected calcification. Skull: Normal. Negative for fracture or focal lesion. Sinuses/Orbits: No acute finding. Other: None. IMPRESSION: No acute intracranial abnormality noted. Electronically Signed   By: Alcide Clever M.D.   On: 01/21/2023 21:29        Scheduled Meds:  folic acid  1 mg Oral Daily   heparin  5,000 Units Subcutaneous Q8H   multivitamin with minerals  1 tablet Oral Daily   Continuous Infusions:  dextrose 5% lactated ringers 50 mL/hr at 01/22/23 0816   thiamine (VITAMIN B1) injection 500 mg (01/22/23 0947)   valproate sodium 500 mg (01/22/23 0442)     LOS: 0 days      Huey Bienenstock, MD [facility]   To contact the attending provider between 7A-7P or the covering provider during after hours 7P-7A, please log into the web site [email or web address] and access using universal [place] password for that web site. If you do not have the password, please call the hospital operator.  01/22/2023, 11:31 AM

## 2023-01-23 ENCOUNTER — Ambulatory Visit: Payer: No Typology Code available for payment source | Admitting: Internal Medicine

## 2023-01-23 DIAGNOSIS — R569 Unspecified convulsions: Secondary | ICD-10-CM | POA: Diagnosis not present

## 2023-01-23 LAB — BASIC METABOLIC PANEL
Anion gap: 10 (ref 5–15)
BUN: 5 mg/dL — ABNORMAL LOW (ref 6–20)
CO2: 25 mmol/L (ref 22–32)
Calcium: 9.1 mg/dL (ref 8.9–10.3)
Chloride: 104 mmol/L (ref 98–111)
Creatinine, Ser: 0.64 mg/dL (ref 0.44–1.00)
GFR, Estimated: >60 mL/min (ref >60)
Glucose, Bld: 101 mg/dL — ABNORMAL HIGH (ref 70–99)
Potassium: 3.3 mmol/L — ABNORMAL LOW (ref 3.5–5.1)
Sodium: 139 mmol/L (ref 135–145)

## 2023-01-23 LAB — PHOSPHORUS: Phosphorus: 2.8 mg/dL (ref 2.5–4.6)

## 2023-01-23 LAB — C DIFFICILE QUICK SCREEN W PCR REFLEX
C Diff antigen: NEGATIVE
C Diff interpretation: NOT DETECTED
C Diff toxin: NEGATIVE

## 2023-01-23 LAB — MAGNESIUM: Magnesium: 2.5 mg/dL — ABNORMAL HIGH (ref 1.7–2.4)

## 2023-01-23 NOTE — TOC Initial Note (Signed)
Transition of Care [facility]) - Initial/Assessment Note    Patient Details  Name: Theresa Rios MRN: [id number] Date of Birth: 09-18-1965  Transition [facility]) CM/SW Contact:    Mearl Latin, LCSW Phone Number: 01/23/2023, 8:56 AM  Clinical Narrative:                 Chart review completed on patient with history of substance use and homelessness. EMS contacted to an apartment complex where patient was not responding. Per [facility] CSW, Letta Kocher., patient was scheduled for an appointment there today and she reached out to financial counseling for a Medicaid screening. CSW will continue to follow for needs.     Barriers to Discharge: Continued Medical Work up   Patient Goals and CMS Choice            Expected Discharge Plan and Services In-house Referral: Clinical Social Work     Living arrangements for the past 2 months: Homeless Shelter                                      Prior Living Arrangements/Services Living arrangements for the past 2 months: Homeless Shelter   Patient language and need for interpreter reviewed:: Yes        Need for Family Participation in Patient Care: Yes (Comment)     Criminal Activity/Legal Involvement Pertinent to Current Situation/Hospitalization: No - Comment as needed  Activities of Daily Living      Permission Sought/Granted                  Emotional Assessment       Orientation: : Oriented to Self, Oriented to Place, Oriented to  Time Alcohol / Substance Use: Illicit Drugs Psych Involvement: No (comment)  Admission diagnosis:  Seizure (HCC) [R56.9] Patient Active Problem List   Diagnosis Date Noted   Cocaine abuse (HCC) 01/21/2023   Non compliance with medical treatment 01/21/2023   Housing instability 09/06/2022   MDD (major depressive disorder), recurrent episode, severe (HCC) 08/31/2022   MDD (major depressive disorder), recurrent, severe, 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 - [place], [place] - [address] [place] [address] Phone: [phone] Fax: [phone]  [id number] [place], [place] - [address] AT [facility] OF [address] [place] [address] Phone: [phone] Fax: [phone]  [facility] [address] [place] [address] Phone: [phone] Fax: [phone]     Social Determinants of Health (SDOH) Social History: SDOH Screenings   Food Insecurity: Food Insecurity Present (11/27/2022)  Housing: High Risk (11/27/2022)  Transportation Needs: Unmet Transportation Needs (11/27/2022)  Utilities: At Risk (11/27/2022)  Alcohol Screen: Medium Risk (08/31/2022)  Depression (PHQ2-9): Low Risk  (04/03/2022)  Tobacco Use: High Risk (01/21/2023)   SDOH Interventions: Food Insecurity Interventions: Inpatient TOC Housing Interventions: Inpatient TOC Transportation Interventions: Inpatient TOC Utilities Interventions: Inpatient TOC   Readmission Risk  Interventions     No data to display

## 2023-01-23 NOTE — TOC Progression Note (Signed)
Transition of Care [facility]) - Progression Note    Patient Details  Name: Theresa Rios MRN: [id number] Date of Birth: 1966/03/24  Transition [facility]) CM/SW Contact  Mearl Latin, LCSW Phone Number: 01/23/2023, 4:27 PM  Clinical Narrative:    CSW met with patient to discuss potential discharge tomorrow. She is very pleasant and reported that she has been staying with "friends and family" and that "he" will come pick her up tomorrow and bring her shoes and purse. She stated she will follow up with Heide Spark at the [facility] for her hospital follow up and that she will let the [facility] clinic know she is discharging. She has an [employer] to help with medication costs.   Will have any needed medication sent to [facility] pharam[facility]. Patient requesting a letter from the MD stating she is medically unable to work so that she can submit it to [employer].   CSW discussed cocaine use with patient and she stated that she must have been  "partying too hard". She declined the need for any community resources.   No other needs identified at this time.    Expected Discharge Plan: Home/Self Care Barriers to Discharge: Continued Medical Work up  Expected Discharge Plan and Services In-house Referral: Clinical Social Work Discharge Planning Services: [facility]   Living arrangements for the past 2 months: Apartment                                       Social Determinants of Health (SDOH) Interventions SDOH Screenings   Food Insecurity: Food Insecurity Present (11/27/2022)  Housing: High Risk (11/27/2022)  Transportation Needs: Unmet Transportation Needs (11/27/2022)  Utilities: At Risk (11/27/2022)  Alcohol Screen: Medium Risk (08/31/2022)  Depression (PHQ2-9): Low Risk  (04/03/2022)  Tobacco Use: High Risk (01/21/2023)    Readmission Risk Interventions     No data to display

## 2023-01-23 NOTE — Progress Notes (Signed)
PROGRESS NOTE    Theresa Rios  WJX:[id number] DOB: December 20, 1965 DOA: 01/21/2023 PCP: Lavinia Sharps, NP    Chief Complaint  Patient presents with   Seizures    Brief Narrative:   57 year old African-American female history of seizure disorder, chronic cocaine abuse, history of alcohol use, history of depression presents to the ER today with repeated see sugars.  She had a seizure at home, another seizure in the ambulance and 2 witnessed seizures in the ER.  Was noted postictal upon admission, she remains confused and somnolent this morning .   Her urine drug screens have all been positive for cocaine in the last year.   CT head without acute intracranial abnormality.     Assessment & Plan:   Principal Problem:   Seizure (HCC) Active Problems:   Cocaine abuse (HCC)   Non compliance with medical treatment  Seizure (HCC) -Presents with seizures, multiple episodes requiring IV Ativan, this is very likely due to noncompliance with her medications Depakote, valproic acid level was undetectable. -Her cocaine abuse, alcohol abuse has been contributing as well. -He is confused and encephalopathic initially, this is likely in the setting of postictal status, and receiving IV Ativan. -Condition much improved this morning, back to baseline, will start on diet  Acute toxic encephalopathy -Multifactorial, due to substance abuse including cocaine, postictal status and receiving IV Ativan for seizures, mentation much improved and back to baseline today.   Non compliance with medical treatment Depakote level non-existent. Pt is suppose to be on scheduled depakote. Clearly she is not taking it.   Cocaine abuse (HCC) Repeated UDS positive for cocaine. Could be the cause of her lower seizure threshold  Alcohol abuse.   - will start on CIWA protocol    DVT prophylaxis: Lovenox Code Status: (Full) Family Communication: None at bedside  Status is: Inpatient    Consultants:   none   Subjective:  No significant events overnight as discussed with staff, she appears to be more awake and alert and appropriate today, she denies any complaints today.. Objective: Vitals:   01/23/23 0303 01/23/23 0500 01/23/23 0800 01/23/23 1247  BP: (!) 155/92  (!) 163/86 111/80  Pulse: 60  66   Resp: 16  20   Temp: 98.2 F (36.8 C)   98.5 F (36.9 C)  TempSrc: Oral   Oral  SpO2: 97%  97%   Weight:  76.2 kg    Height:        Intake/Output Summary (Last 24 hours) at 01/23/2023 1345 Last data filed at 01/22/2023 1455 Gross per 24 hour  Intake --  Output 150 ml  Net -150 ml   Filed Weights   01/21/23 2020 01/23/23 0500  Weight: 75 kg 76.2 kg    Examination:  She is more awake alert and appropriate today. Symmetrical Chest wall movement, Good air movement bilaterally, CTAB RRR,No Gallops,Rubs or new Murmurs, No Parasternal Heave +ve B.Sounds, Abd Soft, No tenderness, No rebound - guarding or rigidity. No Cyanosis, Clubbing or edema, No new Rash or bruise      Data Reviewed: I have personally reviewed following labs and imaging studies  CBC: Recent Labs  Lab 01/21/23 2022 01/21/23 2200  WBC 13.4*  --   NEUTROABS 10.3*  --   HGB 14.0 15.6*  HCT 45.5 46.0  MCV 89.6  --   PLT 222  --     Basic Metabolic Panel: Recent Labs  Lab 01/21/23 2200 01/22/23 1222 01/23/23 0351 01/23/23 0357  NA 135  136 139  --   K 5.7* 3.3* 3.3*  --   CL 105 99 104  --   CO2  --  26 25  --   GLUCOSE 170* 112* 101*  --   BUN 7 <5* 5*  --   CREATININE 0.50 0.58 0.64  --   CALCIUM  --  9.1 9.1  --   MG  --  2.4  --  2.5*  PHOS  --   --   --  2.8    GFR: Estimated Creatinine Clearance: 77.5 mL/min (by C-G formula based on SCr of 0.64 mg/dL).  Liver Function Tests: Recent Labs  Lab 01/22/23 1222  AST 19  ALT 17  ALKPHOS 67  BILITOT 0.7  PROT 7.2  ALBUMIN 3.7    CBG: No results for input(s): "GLUCAP" in the last 168 hours.   Recent Results (from the past  240 hour(s))  C Difficile Quick Screen w PCR reflex     Status: None   Collection Time: 01/23/23  6:59 AM   Specimen: STOOL  Result Value Ref Range Status   C Diff antigen NEGATIVE NEGATIVE Final   C Diff toxin NEGATIVE NEGATIVE Final   C Diff interpretation No C. difficile detected.  Final    Comment: Performed at [facility], [address], [place], [place] [address]         Radiology Studies: CT HEAD WO CONTRAST ( )  Result Date: 01/21/2023 CLINICAL DATA:  New onset seizure activity EXAM: CT HEAD WITHOUT CONTRAST TECHNIQUE: Contiguous axial images were obtained from the base of the skull through the vertex without intravenous contrast. RADIATION DOSE REDUCTION: This exam was performed according to the departmental dose-optimization program which includes automated exposure control, adjustment of the mA and/or kV according to patient size and/or use of iterative reconstruction technique. COMPARISON:  11/27/2022 FINDINGS: Brain: No evidence of acute infarction, hemorrhage, hydrocephalus, extra-axial collection or mass lesion/mass effect. Vascular: No hyperdense vessel or unexpected calcification. Skull: Normal. Negative for fracture or focal lesion. Sinuses/Orbits: No acute finding. Other: None. IMPRESSION: No acute intracranial abnormality noted. Electronically Signed   By: Alcide Clever M.D.   On: 01/21/2023 21:29        Scheduled Meds:  folic acid  1 mg Oral Daily   heparin  5,000 Units Subcutaneous Q8H   multivitamin with minerals  1 tablet Oral Daily   Continuous Infusions:  dextrose 5% lactated ringers 50 mL/hr at 01/22/23 2307   thiamine (VITAMIN B1) injection 500 mg (01/23/23 0830)   valproate sodium 500 mg (01/23/23 0515)     LOS: 1 day      Huey Bienenstock, MD [facility]   To contact the attending provider between 7A-7P or the covering provider during after hours 7P-7A, please log into the web site [email or web address] and access using universal [facility] password for that web site. If you do not have the password, please call the hospital operator.  01/23/2023, 1:45 PM

## 2023-01-23 NOTE — Progress Notes (Deleted)
Cardiology Office Note:    Date:  01/23/2023   ID:  Theresa Rios, DOB June 18, 1966, MRN [id number]  PCP:  Lavinia Sharps, NP   [facility] Providers Cardiologist:  None { Click to update primary MD,subspecialty MD or APP then REFRESH:1}    Referring MD: Karie Soda, MD   No chief complaint on file. ***  History of Present Illness:    Theresa Rios is a 57 y.o. female with a hx of seizure, depression prior SI, cocaine use,   Past Medical History:  Diagnosis Date   Seizures (HCC)    Suicide attempt by drug ingestion (HCC) 03/19/2022    No past surgical history on file.  Current Medications: No outpatient medications have been marked as taking for the 01/23/23 encounter (Appointment) with Maisie Fus, MD.     Allergies:   Patient has no known allergies.   Social History   Socioeconomic History   Marital status: Media planner    Spouse name: Not on file   Number of children: 0   Years of education: Not on file   Highest education level: Not on file  Occupational History   Not on file  Tobacco Use   Smoking status: Every Day    Packs/day: .5    Types: Cigarettes   Smokeless tobacco: Never  Vaping Use   Vaping Use: Never used  Substance and Sexual Activity   Alcohol use: Yes    Alcohol/week: 10.0 standard drinks of alcohol    Types: 10 Cans of beer per week    Comment: drinks Sundays- sometimes during the week   Drug use: Not Currently   Sexual activity: Not on file  Other Topics Concern   Not on file  Social History Narrative   Pt lives in [place] with a friend. Her husband is living with his mother. Pt recently lost her job and her place to stay.   Social Determinants of Health   Financial Resource Strain: Not on file  Food Insecurity: Food Insecurity Present (11/27/2022)   Hunger Vital Sign    Worried About Running Out of Food in the Last Year: Sometimes true    Ran Out of Food in the Last Year: Sometimes true  Transportation  Needs: Unmet Transportation Needs (11/27/2022)   PRAPARE - Administrator, Civil Service (Medical): Yes    Lack of Transportation (Non-Medical): Yes  Physical Activity: Not on file  Stress: Not on file  Social Connections: Not on file     Family History: The patient's ***family history is not on file.  ROS:   Please see the history of present illness.    *** All other systems reviewed and are negative.  EKGs/Labs/Other Studies Reviewed:    The following studies were reviewed today: ***  EKG:  EKG is *** ordered today.  The ekg ordered today demonstrates ***  Recent Labs: 08/01/2022: TSH 0.618 01/21/2023: Hemoglobin 15.6; Platelets 222 01/22/2023: ALT 17; BUN <5; Creatinine, Ser 0.58; Potassium 3.3; Sodium 136 01/23/2023: Magnesium 2.5  Recent Lipid Panel    Component Value Date/Time   CHOL 245 (H) 09/01/2022 0635   TRIG 128 09/01/2022 0635   HDL 89 09/01/2022 0635   CHOLHDL 2.8 09/01/2022 0635   VLDL 26 09/01/2022 0635   LDLCALC 130 (H) 09/01/2022 0635     Risk Assessment/Calculations:   {Does this patient have ATRIAL FIBRILLATION?[phone]}            Physical Exam:    VS:  LMP 05/17/2014     Wt Readings from Last 3 Encounters:  01/23/23 167 lb 15.9 oz (76.2 kg)  11/27/22 165 lb 5.5 oz (75 kg)  10/01/22 161 lb (73 kg)     GEN: *** Well nourished, well developed in no acute distress HEENT: Normal NECK: No JVD; No carotid bruits LYMPHATICS: No lymphadenopathy CARDIAC: ***RRR, no murmurs, rubs, gallops RESPIRATORY:  Clear to auscultation without rales, wheezing or rhonchi  ABDOMEN: Soft, non-tender, non-distended MUSCULOSKELETAL:  No edema; No deformity  SKIN: Warm and dry NEUROLOGIC:  Alert and oriented x 3 PSYCHIATRIC:  Normal affect   ASSESSMENT:    No diagnosis found. PLAN:    In order of problems listed above:  ***      {Are you ordering a CV Procedure (e.g. stress test, cath, DCCV, TEE, etc)?   Press F2        :[id number]}     Medication Adjustments/Labs and Tests Ordered: Current medicines are reviewed at length with the patient today.  Concerns regarding medicines are outlined above.  No orders of the defined types were placed in this encounter.  No orders of the defined types were placed in this encounter.   There are no Patient Instructions on file for this visit.   Signed, Maisie Fus, MD  01/23/2023 11:15 AM    [facility]

## 2023-01-23 NOTE — Progress Notes (Signed)
Patient resting in bed, A/Ox 3, no concerns of pain, seizure precautions in place. Family seen at bedside. Patient currently NPO, education provided to patient and family regarding NPO status. Assessment compete, call bell in reach. Plan of care ongoing.

## 2023-01-24 ENCOUNTER — Encounter: Payer: Self-pay | Admitting: Internal Medicine

## 2023-01-24 ENCOUNTER — Other Ambulatory Visit (HOSPITAL_COMMUNITY): Payer: Self-pay

## 2023-01-24 DIAGNOSIS — Z91199 Patient's noncompliance with other medical treatment and regimen due to unspecified reason: Secondary | ICD-10-CM | POA: Diagnosis not present

## 2023-01-24 DIAGNOSIS — F1410 Cocaine abuse, uncomplicated: Secondary | ICD-10-CM | POA: Diagnosis not present

## 2023-01-24 DIAGNOSIS — R569 Unspecified convulsions: Secondary | ICD-10-CM | POA: Diagnosis not present

## 2023-01-24 MED ORDER — FOLIC ACID 1 MG PO TABS
1.0000 mg | ORAL_TABLET | Freq: Every day | ORAL | 0 refills | Status: DC
  Filled 2023-01-24: qty 30, 30d supply, fill #0

## 2023-01-24 MED ORDER — DIVALPROEX SODIUM 500 MG PO DR TAB
500.0000 mg | DELAYED_RELEASE_TABLET | Freq: Two times a day (BID) | ORAL | 0 refills | Status: DC
  Filled 2023-01-24: qty 60, 30d supply, fill #0

## 2023-01-24 MED ORDER — GABAPENTIN 300 MG PO CAPS
300.0000 mg | ORAL_CAPSULE | Freq: Every day | ORAL | 0 refills | Status: DC
  Filled 2023-01-24: qty 30, 30d supply, fill #0

## 2023-01-24 MED ORDER — METHOCARBAMOL 500 MG PO TABS
500.0000 mg | ORAL_TABLET | Freq: Once | ORAL | Status: AC
  Administered 2023-01-24: 500 mg via ORAL
  Filled 2023-01-24: qty 1

## 2023-01-24 MED ORDER — THIAMINE HCL 100 MG PO TABS
100.0000 mg | ORAL_TABLET | Freq: Every day | ORAL | 0 refills | Status: DC
  Filled 2023-01-24: qty 30, 30d supply, fill #0

## 2023-01-24 MED ORDER — ADULT MULTIVITAMIN W/MINERALS CH
1.0000 | ORAL_TABLET | Freq: Every day | ORAL | 0 refills | Status: DC
  Filled 2023-01-24: qty 130, 130d supply, fill #0

## 2023-01-24 NOTE — Discharge Instructions (Signed)
Follow with Primary MD Placey, Chales Abrahams, NP in 7 days   Do not drive, operating heavy machinery, perform activities at heights, swimming or participation in water activities or provide baby sitting services as you were admitted with  siezures until you have seen by Primary MD or a Neurologist and advised to do so again.  Get CBC, CMP,  checked  by Primary MD next visit.    Activity: As tolerated with Full fall precautions use walker/cane & assistance as needed   Disposition Home    Diet: Heart Healthy   On your next visit with your primary care physician please Get Medicines reviewed and adjusted.   Please request your Prim.MD to go over all Hospital Tests and Procedure/Radiological results at the follow up, please get all Hospital records sent to your Prim MD by signing hospital release before you go home.   If you experience worsening of your admission symptoms, develop shortness of breath, life threatening emergency, suicidal or homicidal thoughts you must seek medical attention immediately by calling 911 or calling your MD immediately  if symptoms less severe.  You Must read complete instructions/literature along with all the possible adverse reactions/side effects for all the Medicines you take and that have been prescribed to you. Take any new Medicines after you have completely understood and accpet all the possible adverse reactions/side effects.   Do not drive, operating heavy machinery, perform activities at heights, swimming or participation in water activities or provide baby sitting services as you were admitted with  siezures until you have seen by Primary MD or a Neurologist and advised to do so again.  Do not drive when taking Pain medications.    Do not take more than prescribed Pain, Sleep and Anxiety Medications  Special Instructions: If you have smoked or chewed Tobacco  in the last 2 yrs please stop smoking, stop any regular Alcohol  and or any Recreational drug  use.  Wear Seat belts while driving.   Please note  You were cared for by a hospitalist during your hospital stay. If you have any questions about your discharge medications or the care you received while you were in the hospital after you are discharged, you can call the unit and asked to speak with the hospitalist on call if the hospitalist that took care of you is not available. Once you are discharged, your primary care physician will handle any further medical issues. Please note that NO REFILLS for any discharge medications will be authorized once you are discharged, as it is imperative that you return to your primary care physician (or establish a relationship with a primary care physician if you do not have one) for your aftercare needs so that they can reassess your need for medications and monitor your lab values.

## 2023-01-24 NOTE — Discharge Summary (Signed)
Physician Discharge Summary  Theresa Rios WUJ:[id number] DOB: 06/08/1966 DOA: 01/21/2023  PCP: Lavinia Sharps, NP  Admit date: 01/21/2023 Discharge date: 01/24/2023  Admitted From: (Home) Disposition:  (Home )  Recommendations for Outpatient Follow-up:  Follow up with PCP in 1-2 weeks Please obtain BMP/CBC in one week Please continue counseling about cocaine and alcohol abuse   Discharge Condition: (Stable)  Diet recommendation: Heart Healthy   Brief/Interim Summary:  57 year old African-American female history of seizure disorder, chronic cocaine abuse, history of alcohol use, history of depression presents to the ER today with repeated see sugars.  She had a seizure at home, another seizure in the ambulance and 2 witnessed seizures in the ER.  Was noted postictal upon admission, she remains confused and somnolent, so she was admitted. - Her urine drug screens have all been positive for cocaine in the last year.  - CT head without acute intracranial abnormality.  Seizure (HCC) -Presents with seizures, multiple episodes requiring IV Ativan, this is very likely due to noncompliance with her medications Depakote, as valproic acid level was undetectable. -Her cocaine abuse, alcohol abuse has been contributing as well. -She is confused and encephalopathic initially, this is likely in the setting of postictal status, and receiving IV Ativan. -She was on IV Depakote initially given hide encephalopathic, but mentation has improved, back to baseline, she will be discharged on Depakote home dose, meds will be provided at time of discharge, I have discussed with her the importance of compliance, as well discussed seizure precautions with her.   Acute toxic encephalopathy -Multifactorial, due to substance abuse including cocaine, postictal status and receiving IV Ativan for seizures, mentation much improved and back to baseline .   Non compliance with medical treatment Depakote level  non-existent. Pt is suppose to be on scheduled depakote. Clearly she is not taking it.  She was counseled.   Cocaine abuse (HCC) Repeated UDS positive for cocaine. Could be the cause of her lower seizure threshold   Alcohol abuse.   -Was kept on CIWA protocol during hospital stay, no evidence of withdrawals at time of discharge, she will be discharged on thiamine, folic acid and multivitamins.   Discharge Diagnoses:  Principal Problem:   Seizure (HCC) Active Problems:   Cocaine abuse (HCC)   Non compliance with medical treatment    Discharge Instructions  Discharge Instructions     Diet - low sodium heart healthy   Complete by: As directed    Discharge instructions   Complete by: As directed    Follow with Primary MD Placey, Chales Abrahams, NP in 7 days   Do not drive, operating heavy machinery, perform activities at heights, swimming or participation in water activities or provide baby sitting services as you were admitted with  siezures until you have seen by Primary MD or a Neurologist and advised to do so again.  Get CBC, CMP,  checked  by Primary MD next visit.    Activity: As tolerated with Full fall precautions use walker/cane & assistance as needed   Disposition Home    Diet: Heart Healthy   On your next visit with your primary care physician please Get Medicines reviewed and adjusted.   Please request your Prim.MD to go over all Hospital Tests and Procedure/Radiological results at the follow up, please get all Hospital records sent to your Prim MD by signing hospital release before you go home.   If you experience worsening of your admission symptoms, develop shortness of breath, life threatening emergency,  suicidal or homicidal thoughts you must seek medical attention immediately by calling 911 or calling your MD immediately  if symptoms less severe.  You Must read complete instructions/literature along with all the possible adverse reactions/side effects for all  the Medicines you take and that have been prescribed to you. Take any new Medicines after you have completely understood and accpet all the possible adverse reactions/side effects.   Do not drive, operating heavy machinery, perform activities at heights, swimming or participation in water activities or provide baby sitting services as you were admitted with  siezures until you have seen by Primary MD or a Neurologist and advised to do so again.  Do not drive when taking Pain medications.    Do not take more than prescribed Pain, Sleep and Anxiety Medications  Special Instructions: If you have smoked or chewed Tobacco  in the last 2 yrs please stop smoking, stop any regular Alcohol  and or any Recreational drug use.  Wear Seat belts while driving.   Please note  You were cared for by a hospitalist during your hospital stay. If you have any questions about your discharge medications or the care you received while you were in the hospital after you are discharged, you can call the unit and asked to speak with the hospitalist on call if the hospitalist that took care of you is not available. Once you are discharged, your primary care physician will handle any further medical issues. Please note that NO REFILLS for any discharge medications will be authorized once you are discharged, as it is imperative that you return to your primary care physician (or establish a relationship with a primary care physician if you do not have one) for your aftercare needs so that they can reassess your need for medications and monitor your lab values.   Increase activity slowly   Complete by: As directed       Allergies as of 01/24/2023   No Known Allergies      Medication List     STOP taking these medications    ibuprofen 800 MG tablet Commonly known as: ADVIL       TAKE these medications    cyclobenzaprine 10 MG tablet Commonly known as: FLEXERIL Take 10 mg by mouth daily.   divalproex 500 MG  DR tablet Commonly known as: DEPAKOTE Take 1 tablet (500 mg total) by mouth every 12 (twelve) hours.   folic acid 1 MG tablet Commonly known as: FOLVITE Take 1 tablet (1 mg total) by mouth daily. Start taking on: Jan 25, 2023   gabapentin 300 MG capsule Commonly known as: NEURONTIN Take 1 capsule (300 mg total) by mouth at bedtime.   multivitamin with minerals Tabs tablet Take 1 tablet by mouth daily. Start taking on: Jan 25, 2023   thiamine 100 MG tablet Commonly known as: VITAMIN B1 Take 1 tablet (100 mg total) by mouth daily.        No Known Allergies  Consultations: None   Procedures/Studies: CT HEAD WO CONTRAST ( )  Result Date: 01/21/2023 CLINICAL DATA:  New onset seizure activity EXAM: CT HEAD WITHOUT CONTRAST TECHNIQUE: Contiguous axial images were obtained from the base of the skull through the vertex without intravenous contrast. RADIATION DOSE REDUCTION: This exam was performed according to the departmental dose-optimization program which includes automated exposure control, adjustment of the mA and/or kV according to patient size and/or use of iterative reconstruction technique. COMPARISON:  11/27/2022 FINDINGS: Brain: No evidence of acute infarction, hemorrhage, hydrocephalus,  extra-axial collection or mass lesion/mass effect. Vascular: No hyperdense vessel or unexpected calcification. Skull: Normal. Negative for fracture or focal lesion. Sinuses/Orbits: No acute finding. Other: None. IMPRESSION: No acute intracranial abnormality noted. Electronically Signed   By: Alcide Clever M.D.   On: 01/21/2023 21:29      Subjective: No significant events overnight, she denies any complaints today, she is eager to go home.  Discharge Exam: Vitals:   01/24/23 0800 01/24/23 0909  BP: (!) 174/161 (!) 115/98  Pulse: 89 88  Resp: (!) 31 19  Temp: 98 F (36.7 C) 98 F (36.7 C)  SpO2:     Vitals:   01/24/23 0023 01/24/23 0517 01/24/23 0800 01/24/23 0909  BP: (!)  145/85 (!) 152/87 (!) 174/161 (!) 115/98  Pulse: 70 72 89 88  Resp: 20 18 (!) 31 19  Temp: 98 F (36.7 C) 97.8 F (36.6 C) 98 F (36.7 C) 98 F (36.7 C)  TempSrc: Oral Oral Oral Oral  SpO2: 97% 95%    Weight:      Height:        General: Pt is alert, awake, not in acute distress Cardiovascular: RRR, S1/S2 +, no rubs, no gallops Respiratory: CTA bilaterally, no wheezing, no rhonchi Abdominal: Soft, NT, ND, bowel sounds + Extremities: no edema, no cyanosis    The results of significant diagnostics from this hospitalization (including imaging, microbiology, ancillary and laboratory) are listed below for reference.     Microbiology: Recent Results (from the past 240 hour(s))  C Difficile Quick Screen w PCR reflex     Status: None   Collection Time: 01/23/23  6:59 AM   Specimen: STOOL  Result Value Ref Range Status   C Diff antigen NEGATIVE NEGATIVE Final   C Diff toxin NEGATIVE NEGATIVE Final   C Diff interpretation No C. difficile detected.  Final    Comment: Performed at [facility], [address] [place], [place] [address]     Labs: BNP (last 3 results) No results for input(s): "BNP" in the last 8760 hours. Basic Metabolic Panel: Recent Labs  Lab 01/21/23 2200 01/22/23 1222 01/23/23 0351 01/23/23 0357  NA 135 136 139  --   K 5.7* 3.3* 3.3*  --   CL 105 99 104  --   CO2  --  26 25  --   GLUCOSE 170* 112* 101*  --   BUN 7 <5* 5*  --   CREATININE 0.50 0.58 0.64  --   CALCIUM  --  9.1 9.1  --   MG  --  2.4  --  2.5*  PHOS  --   --   --  2.8   Liver Function Tests: Recent Labs  Lab 01/22/23 1222  AST 19  ALT 17  ALKPHOS 67  BILITOT 0.7  PROT 7.2  ALBUMIN 3.7   No results for input(s): "LIPASE", "AMYLASE" in the last 168 hours. No results for input(s): "AMMONIA" in the last 168 hours. CBC: Recent Labs  Lab 01/21/23 2022 01/21/23 2200  WBC 13.4*  --   NEUTROABS 10.3*  --   HGB 14.0 15.6*  HCT 45.5 46.0  MCV 89.6  --   PLT 222  --     Cardiac Enzymes: No results for input(s): "CKTOTAL", "CKMB", "CKMBINDEX", "TROPONINI" in the last 168 hours. BNP: Invalid input(s): "POCBNP" CBG: No results for input(s): "GLUCAP" in the last 168 hours. D-Dimer No results for input(s): "DDIMER" in the last 72 hours. Hgb A1c No results  for input(s): "HGBA1C" in the last 72 hours. Lipid Profile No results for input(s): "CHOL", "HDL", "LDLCALC", "TRIG", "CHOLHDL", "LDLDIRECT" in the last 72 hours. Thyroid function studies No results for input(s): "TSH", "T4TOTAL", "T3FREE", "THYROIDAB" in the last 72 hours.  Invalid input(s): "FREET3" Anemia work up No results for input(s): "VITAMINB12", "FOLATE", "FERRITIN", "TIBC", "IRON", "RETICCTPCT" in the last 72 hours. Urinalysis    Component Value Date/Time   COLORURINE YELLOW 11/27/2022 1053   APPEARANCEUR CLEAR 11/27/2022 1053   LABSPEC 1.013 11/27/2022 1053   PHURINE 5.0 11/27/2022 1053   GLUCOSEU NEGATIVE 11/27/2022 1053   HGBUR NEGATIVE 11/27/2022 1053   BILIRUBINUR NEGATIVE 11/27/2022 1053   KETONESUR NEGATIVE 11/27/2022 1053   PROTEINUR NEGATIVE 11/27/2022 1053   UROBILINOGEN 1.0 10/26/2008 1226   NITRITE NEGATIVE 11/27/2022 1053   LEUKOCYTESUR MODERATE (A) 11/27/2022 1053   Sepsis Labs Recent Labs  Lab 01/21/23 2022  WBC 13.4*   Microbiology Recent Results (from the past 240 hour(s))  C Difficile Quick Screen w PCR reflex     Status: None   Collection Time: 01/23/23  6:59 AM   Specimen: STOOL  Result Value Ref Range Status   C Diff antigen NEGATIVE NEGATIVE Final   C Diff toxin NEGATIVE NEGATIVE Final   C Diff interpretation No C. difficile detected.  Final    Comment: Performed at [facility], [address], [place], [place] [address]     Time coordinating discharge: Over 30 minutes  SIGNED:   Huey Bienenstock, MD  [facility] 01/24/2023, 10:31 AM Pager   If 7PM-7AM, please contact night-coverage [email or web address]

## 2023-03-10 ENCOUNTER — Encounter: Payer: Self-pay | Admitting: Cardiology

## 2023-03-10 ENCOUNTER — Ambulatory Visit: Payer: MEDICAID | Attending: Internal Medicine | Admitting: Cardiology

## 2023-04-09 ENCOUNTER — Encounter: Payer: Self-pay | Admitting: Internal Medicine

## 2023-04-09 ENCOUNTER — Ambulatory Visit: Payer: MEDICAID | Attending: Internal Medicine | Admitting: Internal Medicine

## 2023-04-09 NOTE — Progress Notes (Deleted)
Cardiology Office Note:    Date:  04/09/2023   ID:  Theresa Rios, DOB Aug 20, 1966, MRN [id number]  PCP:  Theresa Sharps, NP   [facility] Providers Cardiologist:  None { Click to update primary MD,subspecialty MD or APP then REFRESH:1}    Referring MD: Theresa Sharps, NP   No chief complaint on file. ***  History of Present Illness:    Theresa Rios is a 57 y.o. female with a hx of seizures, SI, challenges with etoh, smoking and cocaine addiction, referral for hernia repair pre-op for hernia repair. She has no cardiac disease hx.   Past Medical History:  Diagnosis Date   Seizures (HCC)    Suicide attempt by drug ingestion (HCC) 03/19/2022    No past surgical history on file.  Current Medications: No outpatient medications have been marked as taking for the 04/09/23 encounter (Appointment) with Maisie Fus, MD.     Allergies:   Patient has no known allergies.   Social History   Socioeconomic History   Marital status: Media planner    Spouse name: Not on file   Number of children: 0   Years of education: Not on file   Highest education level: Not on file  Occupational History   Not on file  Tobacco Use   Smoking status: Every Day    Current packs/day: 0.50    Types: Cigarettes   Smokeless tobacco: Never  Vaping Use   Vaping status: Never Used  Substance and Sexual Activity   Alcohol use: Yes    Alcohol/week: 10.0 standard drinks of alcohol    Types: 10 Cans of beer per week    Comment: drinks Sundays- sometimes during the week   Drug use: Not Currently   Sexual activity: Not on file  Other Topics Concern   Not on file  Social History Narrative   Pt lives in [place] with a friend. Her husband is living with his mother. Pt recently lost her job and her place to stay.   Social Determinants of Health   Financial Resource Strain: Not on file  Food Insecurity: Food Insecurity Present (11/27/2022)   Hunger Vital Sign    Worried About  Running Out of Food in the Last Year: Sometimes true    Ran Out of Food in the Last Year: Sometimes true  Transportation Needs: Unmet Transportation Needs (11/27/2022)   PRAPARE - Administrator, Civil Service (Medical): Yes    Lack of Transportation (Non-Medical): Yes  Physical Activity: Not on file  Stress: Not on file  Social Connections: Not on file     Family History: The patient's ***family history is not on file.  ROS:   Please see the history of present illness.    *** All other systems reviewed and are negative.  EKGs/Labs/Other Studies Reviewed:    The following studies were reviewed today: ***      Recent Labs: 08/01/2022: TSH 0.618 01/21/2023: Hemoglobin 15.6; Platelets 222 01/22/2023: ALT 17 01/23/2023: BUN 5; Creatinine, Ser 0.64; Magnesium 2.5; Potassium 3.3; Sodium 139  Recent Lipid Panel    Component Value Date/Time   CHOL 245 (H) 09/01/2022 0635   TRIG 128 09/01/2022 0635   HDL 89 09/01/2022 0635   CHOLHDL 2.8 09/01/2022 0635   VLDL 26 09/01/2022 0635   LDLCALC 130 (H) 09/01/2022 0635     Risk Assessment/Calculations:   {Does this patient have ATRIAL FIBRILLATION?:[id number]}  No BP recorded.  {Refresh Note OR Click here  to enter BP  :1}***         Physical Exam:    VS:  LMP 05/17/2014     Wt Readings from Last 3 Encounters:  01/23/23 167 lb 15.9 oz (76.2 kg)  11/27/22 165 lb 5.5 oz (75 kg)  10/01/22 161 lb (73 kg)     GEN: *** Well nourished, well developed in no acute distress HEENT: Normal NECK: No JVD; No carotid bruits LYMPHATICS: No lymphadenopathy CARDIAC: ***RRR, no murmurs, rubs, gallops RESPIRATORY:  Clear to auscultation without rales, wheezing or rhonchi  ABDOMEN: Soft, non-tender, non-distended MUSCULOSKELETAL:  No edema; No deformity  SKIN: Warm and dry NEUROLOGIC:  Alert and oriented x 3 PSYCHIATRIC:  Normal affect   ASSESSMENT:    PreOP -[name] presents for pre-op for hernia repair. She can walk >  4 METS without shortness of breath or chest pressure. No recommendation for further ischemic eval prior to surgery - EKG *** -She is intermediate risk based on her co morbidities including smoking and obesity for low risk surgery. Recommend smoking cessation and weight loss.  PLAN:    In order of problems listed above:  No further cardiac w/u recommended at this time She is acceptable cardiac risk for low risk hernia repair      {Are you ordering a CV Procedure (e.g. stress test, cath, DCCV, TEE, etc)?   Press F2        :[id number]}    Medication Adjustments/Labs and Tests Ordered: Current medicines are reviewed at length with the patient today.  Concerns regarding medicines are outlined above.  No orders of the defined types were placed in this encounter.  No orders of the defined types were placed in this encounter.   There are no Patient Instructions on file for this visit.   Signed, Maisie Fus, MD  04/09/2023 12:57 PM    [facility]

## 2023-04-21 ENCOUNTER — Telehealth (INDEPENDENT_AMBULATORY_CARE_PROVIDER_SITE_OTHER): Payer: Self-pay | Admitting: Primary Care

## 2023-04-21 NOTE — Telephone Encounter (Signed)
Pt was unavailable. Number was not reachable

## 2023-04-22 ENCOUNTER — Encounter (INDEPENDENT_AMBULATORY_CARE_PROVIDER_SITE_OTHER): Payer: Self-pay | Admitting: Primary Care

## 2023-04-22 ENCOUNTER — Ambulatory Visit (INDEPENDENT_AMBULATORY_CARE_PROVIDER_SITE_OTHER): Payer: MEDICAID | Admitting: Primary Care

## 2023-04-22 VITALS — BP 133/74 | HR 84 | Resp 16 | Ht 62.0 in | Wt 167.4 lb

## 2023-04-22 DIAGNOSIS — R569 Unspecified convulsions: Secondary | ICD-10-CM

## 2023-04-22 DIAGNOSIS — Z1211 Encounter for screening for malignant neoplasm of colon: Secondary | ICD-10-CM

## 2023-04-22 DIAGNOSIS — Z7689 Persons encountering health services in other specified circumstances: Secondary | ICD-10-CM | POA: Diagnosis not present

## 2023-04-22 DIAGNOSIS — Z1159 Encounter for screening for other viral diseases: Secondary | ICD-10-CM

## 2023-04-22 DIAGNOSIS — Z9189 Other specified personal risk factors, not elsewhere classified: Secondary | ICD-10-CM

## 2023-04-22 DIAGNOSIS — R030 Elevated blood-pressure reading, without diagnosis of hypertension: Secondary | ICD-10-CM

## 2023-04-22 DIAGNOSIS — E876 Hypokalemia: Secondary | ICD-10-CM | POA: Diagnosis not present

## 2023-04-22 DIAGNOSIS — Z1231 Encounter for screening mammogram for malignant neoplasm of breast: Secondary | ICD-10-CM

## 2023-04-22 NOTE — Progress Notes (Signed)
New Patient Office Visit  Subjective    Patient ID: Theresa Rios, female    DOB: 06/08/1966  Age: 57 y.o. MRN: [id number]  CC:    HPI Theresa Rios is 56 year old female presents to establish care ( occupied by Theresa Rios husband). Reviewed chart and drug abuse listed last used cocaine last month and had 2 beers yesterday.    Outpatient Encounter Medications as of 04/22/2023  Medication Sig   cyclobenzaprine (FLEXERIL) 10 MG tablet Take 10 mg by mouth daily.   divalproex (DEPAKOTE) 500 MG DR tablet Take 1 tablet (500 mg total) by mouth every 12 (twelve) hours.   folic acid (FOLVITE) 1 MG tablet Take 1 tablet (1 mg total) by mouth daily.   gabapentin (NEURONTIN) 300 MG capsule Take 1 capsule (300 mg total) by mouth at bedtime.   Multiple Vitamin (MULTIVITAMIN WITH MINERALS) TABS tablet Take 1 tablet by mouth daily.   thiamine (VITAMIN B1) 100 MG tablet Take 1 tablet (100 mg total) by mouth daily.   No facility-administered encounter medications on file as of 04/22/2023.    Past Medical History:  Diagnosis Date   Seizures (HCC)    Suicide attempt by drug ingestion (HCC) 03/19/2022    No past surgical history on file.  No family history on file.  Social History   Socioeconomic History   Marital status: Media planner    Spouse name: Not on file   Number of children: 0   Years of education: Not on file   Highest education level: Not on file  Occupational History   Not on file  Tobacco Use   Smoking status: Every Day    Current packs/day: 0.50    Types: Cigarettes   Smokeless tobacco: Never  Vaping Use   Vaping status: Never Used  Substance and Sexual Activity   Alcohol use: Yes    Alcohol/week: 10.0 standard drinks of alcohol    Types: 10 Cans of beer per week    Comment: drinks Sundays- sometimes during the week   Drug use: Not Currently   Sexual activity: Not on file  Other Topics Concern   Not on file  Social History Narrative   Pt lives in  [place] with a friend. Her husband is living with his mother. Pt recently lost her job and her place to stay.   Social Determinants of Health   Financial Resource Strain: Not on file  Food Insecurity: Food Insecurity Present (11/27/2022)   Hunger Vital Sign    Worried About Running Out of Food in the Last Year: Sometimes true    Ran Out of Food in the Last Year: Sometimes true  Transportation Needs: Unmet Transportation Needs (11/27/2022)   PRAPARE - Administrator, Civil Service (Medical): Yes    Lack of Transportation (Non-Medical): Yes  Physical Activity: Not on file  Stress: Not on file  Social Connections: Not on file  Intimate Partner Violence: Not At Risk (11/27/2022)   Humiliation, Afraid, Rape, and Kick questionnaire    Fear of Current or Ex-Partner: No    Emotionally Abused: No    Physically Abused: No    Sexually Abused: No    ROS Comprehensive ROS Pertinent positive and negative noted in HPI     Objective   Blood Pressure 133/74   Pulse 84   Respiration 16   Height 5\' 2"  (1.575 m)   Weight 167 lb 6.4 oz (75.9 kg)   Last Menstrual Period 05/17/2014  Oxygen Saturation 95%   Body Mass Index 30.62 kg/m   Last Menstrual Period 05/17/2014     Assessment & Plan:  Theresa Rios was seen today for new patient (initial visit).  Diagnoses and all orders for this visit:  Hypokalemia -     CMP14+EGFR  Encounter to establish care  [facility] -     Ambulatory referral to Neurology  Encounter for HCV screening test for high risk patient -     HCV Ab w Reflex to Quant PCR  Colon cancer screening -     Ambulatory referral to Gastroenterology  Encounter for screening mammogram for malignant neoplasm of breast -     MM DIGITAL SCREENING BILATERAL; Future  Elevated blood pressure reading in office without diagnosis of hypertension Left with out solution   Theresa Sessions, NP

## 2023-05-05 ENCOUNTER — Emergency Department (HOSPITAL_COMMUNITY): Payer: MEDICAID

## 2023-05-05 ENCOUNTER — Other Ambulatory Visit: Payer: Self-pay

## 2023-05-05 ENCOUNTER — Emergency Department (HOSPITAL_COMMUNITY)
Admission: EM | Admit: 2023-05-05 | Discharge: 2023-05-05 | Disposition: A | Discharge status: Home / Self Care | Payer: MEDICAID | Attending: Emergency Medicine | Admitting: Emergency Medicine

## 2023-05-05 DIAGNOSIS — Z91148 Patient's other noncompliance with medication regimen for other reason: Secondary | ICD-10-CM

## 2023-05-05 DIAGNOSIS — Z91199 Patient's noncompliance with other medical treatment and regimen due to unspecified reason: Secondary | ICD-10-CM | POA: Diagnosis not present

## 2023-05-05 DIAGNOSIS — R569 Unspecified convulsions: Secondary | ICD-10-CM | POA: Diagnosis present

## 2023-05-05 LAB — CBC WITH DIFFERENTIAL/PLATELET
Abs Immature Granulocytes: 0.02 10*3/uL (ref 0.00–0.07)
Basophils Absolute: 0 10*3/uL (ref 0.0–0.1)
Basophils Relative: 0 %
Eosinophils Absolute: 0.1 10*3/uL (ref 0.0–0.5)
Eosinophils Relative: 1 %
HCT: 38.8 % (ref 36.0–46.0)
Hemoglobin: 12.3 g/dL (ref 12.0–15.0)
Immature Granulocytes: 0 %
Lymphocytes Relative: 21 %
Lymphs Abs: 1.3 10*3/uL (ref 0.7–4.0)
MCH: 28.5 pg (ref 26.0–34.0)
MCHC: 31.7 g/dL (ref 30.0–36.0)
MCV: 89.8 fL (ref 80.0–100.0)
Monocytes Absolute: 1 10*3/uL (ref 0.1–1.0)
Monocytes Relative: 15 %
Neutro Abs: 4 10*3/uL (ref 1.7–7.7)
Neutrophils Relative %: 63 %
Platelets: 240 10*3/uL (ref 150–400)
RBC: 4.32 MIL/uL (ref 3.87–5.11)
RDW: 14.2 % (ref 11.5–15.5)
WBC: 6.4 10*3/uL (ref 4.0–10.5)
nRBC: 0 % (ref 0.0–0.2)

## 2023-05-05 LAB — ACETAMINOPHEN LEVEL: Acetaminophen (Tylenol), Serum: <10 ug/mL — ABNORMAL LOW (ref 10–30)

## 2023-05-05 LAB — COMPREHENSIVE METABOLIC PANEL
ALT: 19 U/L (ref 0–44)
AST: 32 U/L (ref 15–41)
Albumin: 3.3 g/dL — ABNORMAL LOW (ref 3.5–5.0)
Alkaline Phosphatase: 78 U/L (ref 38–126)
Anion gap: 7 (ref 5–15)
BUN: 7 mg/dL (ref 6–20)
CO2: 27 mmol/L (ref 22–32)
Calcium: 9 mg/dL (ref 8.9–10.3)
Chloride: 105 mmol/L (ref 98–111)
Creatinine, Ser: 0.66 mg/dL (ref 0.44–1.00)
GFR, Estimated: >60 mL/min (ref >60)
Glucose, Bld: 137 mg/dL — ABNORMAL HIGH (ref 70–99)
Potassium: 3.9 mmol/L (ref 3.5–5.1)
Sodium: 139 mmol/L (ref 135–145)
Total Bilirubin: 0.7 mg/dL (ref 0.3–1.2)
Total Protein: 6.7 g/dL (ref 6.5–8.1)

## 2023-05-05 LAB — I-STAT CHEM 8, ED
BUN: 9 mg/dL (ref 6–20)
Calcium, Ion: 1.21 mmol/L (ref 1.15–1.40)
Chloride: 105 mmol/L (ref 98–111)
Creatinine, Ser: 0.7 mg/dL (ref 0.44–1.00)
Glucose, Bld: 139 mg/dL — ABNORMAL HIGH (ref 70–99)
HCT: 39 % (ref 36.0–46.0)
Hemoglobin: 13.3 g/dL (ref 12.0–15.0)
Potassium: 3.8 mmol/L (ref 3.5–5.1)
Sodium: 142 mmol/L (ref 135–145)
TCO2: 27 mmol/L (ref 22–32)

## 2023-05-05 LAB — AMMONIA: Ammonia: 26 umol/L (ref 9–35)

## 2023-05-05 LAB — I-STAT CG4 LACTIC ACID, ED: Lactic Acid, Venous: 0.5 mmol/L (ref 0.5–1.9)

## 2023-05-05 LAB — ETHANOL: Alcohol, Ethyl (B): <10 mg/dL (ref <10)

## 2023-05-05 LAB — SALICYLATE LEVEL: Salicylate Lvl: <7 mg/dL — ABNORMAL LOW (ref 7.0–30.0)

## 2023-05-05 LAB — VALPROIC ACID LEVEL: Valproic Acid Lvl: <10 ug/mL — ABNORMAL LOW (ref 50.0–100.0)

## 2023-05-05 MED ORDER — DIVALPROEX SODIUM 500 MG PO DR TAB: 500.0000 mg | DELAYED_RELEASE_TABLET | Freq: Two times a day (BID) | ORAL | 2 refills | Status: DC

## 2023-05-05 MED ORDER — DIVALPROEX SODIUM 250 MG PO DR TAB: 500.0000 mg | DELAYED_RELEASE_TABLET | Freq: Two times a day (BID) | ORAL | Status: DC

## 2023-05-05 MED ORDER — THIAMINE HCL 100 MG/ML IJ SOLN
100.0000 mg | Freq: Every day | INTRAMUSCULAR | Status: DC
  Administered 2023-05-05: 100 mg via INTRAVENOUS
  Filled 2023-05-05: qty 2

## 2023-05-05 NOTE — ED Notes (Signed)
Patient transported to CT 

## 2023-05-05 NOTE — ED Notes (Signed)
PT was discharged and stated that she did not have a ride home.I was concerned about her getting into [facility] because she came from a shelter and Charge gave her a cab voucher and informed me to call. No one from [facility] answered.

## 2023-05-05 NOTE — ED Provider Notes (Signed)
[facility]ERGENCY DE[facility]MENT AT [facility] Provider Note   CSN: [id number] Arrival date & time: 05/05/23  1739     History  Chief Complaint  Patient presents with   Seizures    Theresa Rios is a 57 y.o. female who presents to the emergency department via EMS for suspected seizure.  She  has a past medical history of Seizures (HCC) and Suicide attempt by drug ingestion (HCC) (03/19/2022).  EMS reports that [place] after having what appeared to be a seizure.  They found her postictal and combative.  Patient is unable to give a history at this time.  She notably had blood on her shirt.    Seizures      Home Medications Prior to Admission medications   Medication Sig Start Date End Date Taking? Authorizing Provider  cyclobenzaprine (FLEXERIL) 10 MG tablet Take 10 mg by mouth daily.    [provider]  divalproex (DEPAKOTE) 500 MG DR tablet Take 1 tablet (500 mg total) by mouth every 12 (twelve) hours. 05/05/23   Arthor Captain, PA-C  folic acid (FOLVITE) 1 MG tablet Take 1 tablet (1 mg total) by mouth daily. 01/25/23   Elgergawy, Leana Roe, MD  gabapentin (NEURONTIN) 300 MG capsule Take 1 capsule (300 mg total) by mouth at bedtime. 01/24/23   Elgergawy, Leana Roe, MD  Multiple Vitamin (MULTIVITAMIN WITH MINERALS) TABS tablet Take 1 tablet by mouth daily. 01/25/23   Elgergawy, Leana Roe, MD  thiamine (VITAMIN B1) 100 MG tablet Take 1 tablet (100 mg total) by mouth daily. 01/24/23   Elgergawy, Leana Roe, MD      Allergies    Patient has no known allergies.    Review of Systems   Review of Systems  Neurological:  Positive for seizures.    Physical Exam Updated Vital Signs BP 129/79   Pulse 94   Temp 98.9 F (37.2 C)   Resp 17   Ht 5\' 2"  (1.575 m)   Wt 75.9 kg   LMP 05/17/2014   SpO2 96%   BMI 30.60 kg/m  Physical Exam Vitals and nursing note reviewed.  Constitutional:      General: She is not in acute distress.     Appearance: She is well-developed. She is not diaphoretic.  HENT:     Head: Normocephalic and atraumatic.     Comments: Patient with bite marks to the tongue    Right Ear: External ear normal.     Left Ear: External ear normal.     Nose: Nose normal.     Mouth/Throat:     Mouth: Mucous membranes are moist.  Eyes:     General: No scleral icterus.    Conjunctiva/sclera: Conjunctivae normal.     Pupils: Pupils are equal, round, and reactive to light.  Cardiovascular:     Rate and Rhythm: Normal rate and regular rhythm.     Heart sounds: Normal heart sounds. No murmur heard.    No friction rub. No gallop.  Pulmonary:     Effort: Pulmonary effort is normal. No respiratory distress.     Breath sounds: Normal breath sounds.  Abdominal:     General: Bowel sounds are normal. There is no distension.     Palpations: Abdomen is soft. There is no mass.     Tenderness: There is no abdominal tenderness. There is no guarding.  Musculoskeletal:     Cervical back: Normal range of motion.  Skin:  General: Skin is warm and dry.  Neurological:     Mental Status: She is lethargic.  Psychiatric:        Behavior: Behavior normal.     ED Results / Procedures / Treatments   Labs (all labs ordered are listed, but only abnormal results are displayed) Labs Reviewed  COMPREHENSIVE METABOLIC PANEL - Abnormal; Notable for the following components:      Result Value   Glucose, Bld 137 (*)    Albumin 3.3 (*)    All other components within normal limits  ACETAMINOPHEN LEVEL - Abnormal; Notable for the following components:   Acetaminophen (Tylenol), Serum <10 (*)    All other components within normal limits  SALICYLATE LEVEL - Abnormal; Notable for the following components:   Salicylate Lvl <7.0 (*)    All other components within normal limits  VALPROIC ACID LEVEL - Abnormal; Notable for the following components:   Valproic Acid Lvl <10 (*)    All other components within normal limits  I-STAT  CHEM 8, ED - Abnormal; Notable for the following components:   Glucose, Bld 139 (*)    All other components within normal limits  CBC WITH DIFFERENTIAL/PLATELET  ETHANOL  AMMONIA  RAPID URINE DRUG SCREEN, HOSP PERFORMED  CBG MONITORING, ED  I-STAT CG4 LACTIC ACID, ED  I-STAT CG4 LACTIC ACID, ED    EKG None  Radiology CT Head Wo Contrast  Result Date: 05/05/2023 CLINICAL DATA:  Poly trauma, blunt.  Seizures. EXAM: CT HEAD WITHOUT CONTRAST TECHNIQUE: Contiguous axial images were obtained from the base of the skull through the vertex without intravenous contrast. RADIATION DOSE REDUCTION: This exam was performed according to the departmental dose-optimization program which includes automated exposure control, adjustment of the mA and/or kV according to patient size and/or use of iterative reconstruction technique. COMPARISON:  MRI brain 11/28/2022.  CT head 01/21/2023 FINDINGS: Brain: No evidence of acute infarction, hemorrhage, hydrocephalus, extra-axial collection or mass lesion/mass effect. Vascular: No hyperdense vessel or unexpected calcification. Skull: Normal. Negative for fracture or focal lesion. Sinuses/Orbits: Old medial right orbital wall deformity. Mucosal thickening in the maxillary antra. No acute air-fluid levels. Mastoid air cells are clear. Other: The skull base and C1 are rotated with respect to C2. This is likely to be positional but could indicate muscle spasm in the appropriate clinical setting. IMPRESSION: 1. No acute intracranial abnormalities. 2. Chronic inflammatory changes suggested in the paranasal sinuses. 3. Rotation at C1-2 is likely positional but could indicate muscle spasm. Electronically Signed   By: Burman Nieves M.D.   On: 05/05/2023 20:48    Procedures Procedures    Medications Ordered in ED Medications  thiamine (VITAMIN B1) injection 100 mg (100 mg Intravenous Given 05/05/23 2056)  divalproex (DEPAKOTE) DR tablet 500 mg (has no administration in time  range)    ED Course/ Medical Decision Making/ A&P Clinical Course as of 05/05/23 2202  Mon May 05, 2023  1939 patient is still very sleepy and difficult to arouse. Will add lactic, ammonia, and ct head [AH]  2141 Patient still very sleepy but.now Easier to arouse.  I sat her up and yelled a little bit and she woke up.  Patient states that she usually gets very sleepy for a long time after her seizures and that is not abnormal.  She states she knows she is in the hospital. [AH]  2141 I-stat chem 8, ed(!) [AH]  2141 Valproic acid level(!) Depakote level is nonexistent.  Patient reports she ran out of her  medication. [AH]  2141 Acetaminophen level(!) [AH]  2141 Sodium: 139 [AH]  2141 Comprehensive metabolic panel(!) [AH]  2201 I-Stat CG4 Lactic Acid [AH]  2201 Ammonia [AH]    Clinical Course User Index [AH] Arthor Captain, PA-C                                 Medical Decision Making This patient presents to the ED for concern of seizure, this involves an extensive number of treatment options, and is a complaint that carries with it a high risk of complications and morbidity.  The differential diagnosis for includes but is not limited to idiopathic seizure, traumatic brain injury, intracranial hemorrhage, vascular lesion, mass or space containing lesion, degenerative neurologic disease, congenital brain abnormality, infectious etiology such as meningitis, encephalitis or abscess, metabolic disturbance including hyper or hypoglycemia, hyper or hyponatremia, hyperosmolar state, uremia, hepatic failure, hypocalcemia, hypomagnesemia.  Toxic substances such as cocaine, lidocaine, antidepressants, theophylline, alcohol withdrawal, drug withdrawal, eclampsia, hypertensive encephalopathy and anoxic brain injury.    Co morbidities:      Past Medical History: No date: Seizures (HCC) 03/19/2022: Suicide attempt by drug ingestion [facility])   Social Determinants of Health:       SDOH  Screenings Food Insecurity: Food Insecurity Present (11/27/2022) Housing: High Risk (11/27/2022) Transportation Needs: Unmet Transportation Needs (11/27/2022) Utilities: At Risk (11/27/2022) Alcohol Screen: Medium Risk (08/31/2022) Depression (PHQ2-9): High Risk (04/22/2023) Tobacco Use: High Risk (04/22/2023)   Additional history:  {Additional history obtained from EMS   Lab Tests:  I Ordered, and personally interpreted labs.  The pertinent results include:   As per ED course   Imaging Studies:  As per ed course  Cardiac Monitoring/ECG:       The patient was maintained on a cardiac monitor.  I personally viewed and interpreted the cardiac monitored which showed an underlying rhythm of: sinus rhythm  Medicines ordered and prescription drug management:  I ordered medication including Medications thiamine (VITAMIN B1) injection 100 mg (100 mg Intravenous Given 05/05/23 2056) divalproex (DEPAKOTE) DR tablet 500 mg (has no administration in time range) for seizure, hx of etoh abuse Reevaluation of the patient after these medicines showed that the patient resolved I have reviewed the patients home medicines and have made adjustments as needed  Test Considered:       MRI- patient back to baseline and admits to med noncompliance  Critical Interventions:         Consultations Obtained:   Problem List / ED Course:       (R56.9) Seizure (HCC)  (primary encounter diagnosis)  (Z91.148) Nonadherence to medication   MDM: Patient here with seizure, not taking her Depakote.  Patient given a oral loading dose here and will be discharged with a medication prescription with 2 refills.  Discussed outpatient follow-up and return precautions.   Dispostion:  After consideration of the diagnostic results and the patients response to treatment, I feel that the patent would benefit from discharge.     Amount and/or Complexity of Data Reviewed Labs: ordered. Decision-making  details documented in ED Course. Radiology: ordered. ECG/medicine tests: ordered and independent interpretation performed.    Details: ECG interpretation   Date: 05/05/2023  Rate:102  Rhythm: sinus tachycardia  QRS Axis: normal  Intervals: normal  ST/T Wave abnormalities: normal  Conduction Disutrbances: none      Risk Prescription drug management.           Final Clinical  Impression(s) / ED Diagnoses Final diagnoses:  Seizure (HCC)  Nonadherence to medication    Rx / DC Orders ED Discharge Orders          Ordered    divalproex (DEPAKOTE) 500 MG DR tablet  Every 12 hours        05/05/23 2149              Arthor Captain, PA-C 05/05/23 2205    Glyn Ade, MD 05/05/23 [phone]

## 2023-05-05 NOTE — Discharge Instructions (Addendum)
Get help right away if: You have or someone has seen you have: A seizure that lasts longer than 5 minutes. Many seizures in a row and you don't feel better between seizures. A seizure that makes it harder to breathe. A seizure that leaves you unable to speak or use a part of your body. You didn't wake up right away after a seizure. You injure yourself during a seizure. You have confusion or pain right after a seizure. These symptoms may be an emergency. Call 911 right away. Do not wait to see if the symptoms will go away. Do not drive yourself to the hospital.

## 2023-05-05 NOTE — ED Notes (Signed)
1st lac in normal range, 2nd not needed

## 2023-05-05 NOTE — ED Triage Notes (Signed)
[place], pt responsive to pain stimuli, looked like she bit her tongue. Possible postictal seizure   Upon assessment pt A/Ox3, bite noted on right side of the tongue.

## 2023-05-05 NOTE — ED Notes (Signed)
PT has been sleeping and when yelling her name, no response. No urine has been collected at this time due to this

## 2023-05-16 ENCOUNTER — Encounter: Payer: MEDICAID | Admitting: Student

## 2023-07-08 ENCOUNTER — Emergency Department (HOSPITAL_COMMUNITY): Payer: MEDICAID

## 2023-07-08 ENCOUNTER — Other Ambulatory Visit: Payer: Self-pay

## 2023-07-08 ENCOUNTER — Encounter (HOSPITAL_COMMUNITY): Payer: Self-pay | Admitting: Emergency Medicine

## 2023-07-08 ENCOUNTER — Emergency Department (HOSPITAL_COMMUNITY)
Admission: EM | Admit: 2023-07-08 | Discharge: 2023-07-08 | Disposition: A | Discharge status: Home / Self Care | Payer: MEDICAID | Attending: Emergency Medicine | Admitting: Emergency Medicine

## 2023-07-08 DIAGNOSIS — J320 Chronic maxillary sinusitis: Secondary | ICD-10-CM | POA: Diagnosis not present

## 2023-07-08 DIAGNOSIS — R569 Unspecified convulsions: Secondary | ICD-10-CM

## 2023-07-08 DIAGNOSIS — G40909 Epilepsy, unspecified, not intractable, without status epilepticus: Secondary | ICD-10-CM | POA: Insufficient documentation

## 2023-07-08 MED ORDER — AMOXICILLIN-POT CLAVULANATE 875-125 MG PO TABS: 1.0000 | ORAL_TABLET | Freq: Two times a day (BID) | ORAL | 0 refills | Status: AC

## 2023-07-08 NOTE — ED Notes (Signed)
Pt refuses blood draw.  EDP notified.  Pt advised that this is a no smoking campus, she voices understanding

## 2023-07-08 NOTE — ED Provider Notes (Signed)
4:55 PM Assumed care of patient from off-going team. For more details, please see note from same day.  In brief, this is a 57 y.o. female w/ h/o EtOH use and seizures. Unclear compliance w/ neurontin an depakote. Now neuro intact and back to baseline. Also complained of cough. Refused labs.   Plan/Dispo at time of sign-out & ED Course since sign-out: [ ]  [facility], CXR  LMP 05/17/2014    ED Course:   Clinical Course as of 07/08/23 1818  Tue Jul 08, 2023  1722 CT Head Wo Contrast 1. No acute intracranial abnormality. 2. Marked severity right maxillary sinus disease. 3. 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   [HN]  1723 DG Chest Port 1 View Mild bibasilar atelectasis. [HN]  1723 Will give augmentin for sinusitis. Pt instructed to f/u with PCP and neurologist in 1-2 weeks. DC w/ discharge instructions/return precautions. All questions answered to patient's satisfaction.   [HN]    Clinical Course User Index [HN] Loetta Rough, MD    Dispo: C ------------------------------- Vivi Barrack, MD Emergency Medicine  This note was created using dictation software, which may contain spelling or grammatical errors.   Loetta Rough, MD 07/08/23 [phone]

## 2023-07-08 NOTE — ED Notes (Signed)
Pt refused vitals at discharge. 

## 2023-07-08 NOTE — Discharge Instructions (Addendum)
Please follow up with your primary care physician about right-sided sinusitis or a sinus infection. Please take augmentin twice per day for 10 days. Please also follow up with your neurologist about your seizures and take your medications as prescribed daily. If you have any worsening symptoms or new concerns, please do not hesitate to return to the ED or call 911.

## 2023-07-08 NOTE — ED Triage Notes (Signed)
Pt BIB EMS from bus, c/o seizures. Onset and duration both unknown. First one occurred [place] friends house, the second while on a bus. 80% on room air, placed on 3 liters O2NC  BP 140/70 P 105 SpO2 96% 3 liters 20 LFA

## 2023-07-08 NOTE — ED Provider Notes (Signed)
[place] EMERGENCY DEPARTMENT AT [facility] Provider Note   CSN: [id number] Arrival date & time: 07/08/23  1419     History {Add pertinent medical, surgical, social history, OB history to HPI:1} No chief complaint on file.   Theresa Rios is a 57 y.o. female.  Patient is a 57 year old female with a history of alcohol abuse, cocaine use and seizure disorder who presents after a witnessed seizure.  She currently staying at the [facility].  She does remember having a seizure.  She did bite her tongue.  She does have a headache but denies any other injuries.  She does states she was incontinent.  She says that she takes Neurontin for seizures although it appears on chart review that she is also on Depakote.  She says she has been taking all her medications.  She does drink alcohol daily and says she drinks beers.  She had just gone to the store to buy some beers but has not been able to drink them yet.       Home Medications Prior to Admission medications   Medication Sig Start Date End Date Taking? Authorizing Provider  cyclobenzaprine (FLEXERIL) 10 MG tablet Take 10 mg by mouth daily.    [provider]  divalproex (DEPAKOTE) 500 MG DR tablet Take 1 tablet (500 mg total) by mouth every 12 (twelve) hours. 05/05/23   Arthor Captain, PA-C  folic acid (FOLVITE) 1 MG tablet Take 1 tablet (1 mg total) by mouth daily. 01/25/23   Elgergawy, Leana Roe, MD  gabapentin (NEURONTIN) 300 MG capsule Take 1 capsule (300 mg total) by mouth at bedtime. 01/24/23   Elgergawy, Leana Roe, MD  Multiple Vitamin (MULTIVITAMIN WITH MINERALS) TABS tablet Take 1 tablet by mouth daily. 01/25/23   Elgergawy, Leana Roe, MD  thiamine (VITAMIN B1) 100 MG tablet Take 1 tablet (100 mg total) by mouth daily. 01/24/23   Elgergawy, Leana Roe, MD      Allergies    Patient has no known allergies.    Review of Systems   Review of Systems  Constitutional:  Negative for chills, diaphoresis, fatigue and fever.   HENT:  Negative for congestion, rhinorrhea and sneezing.   Eyes: Negative.   Respiratory:  Positive for cough. Negative for chest tightness and shortness of breath.   Cardiovascular:  Negative for chest pain and leg swelling.  Gastrointestinal:  Negative for abdominal pain, blood in stool, diarrhea, nausea and vomiting.  Genitourinary:  Negative for difficulty urinating, flank pain, frequency and hematuria.  Musculoskeletal:  Negative for arthralgias and back pain.  Skin:  Positive for wound (Lip laceration). Negative for rash.  Neurological:  Positive for seizures. Negative for dizziness, speech difficulty, weakness, numbness and headaches.    Physical Exam Updated Vital Signs LMP 05/17/2014  Physical Exam Constitutional:      Appearance: She is well-developed.  HENT:     Head: Normocephalic and atraumatic.  Eyes:     Pupils: Pupils are equal, round, and reactive to light.  Cardiovascular:     Rate and Rhythm: Normal rate and regular rhythm.     Heart sounds: Normal heart sounds.  Pulmonary:     Effort: Pulmonary effort is normal. No respiratory distress.     Breath sounds: Normal breath sounds. No wheezing or rales.  Chest:     Chest wall: No tenderness.  Abdominal:     General: Bowel sounds are normal.     Palpations: Abdomen is soft.     Tenderness: There  is no abdominal tenderness. There is no guarding or rebound.  Musculoskeletal:        General: Normal range of motion.     Cervical back: Normal range of motion and neck supple.  Lymphadenopathy:     Cervical: No cervical adenopathy.  Skin:    General: Skin is warm and dry.     Findings: No rash.  Neurological:     General: No focal deficit present.     Mental Status: She is alert and oriented to person, place, and time.     ED Results / Procedures / Treatments   Labs (all labs ordered are listed, but only abnormal results are displayed) Labs Reviewed  VALPROIC ACID LEVEL  COMPREHENSIVE METABOLIC PANEL  CBC  WITH DIFFERENTIAL/PLATELET  ETHANOL    EKG None  Radiology No results found.  Procedures Procedures  {Document cardiac monitor, telemetry assessment procedure when appropriate:1}  Medications Ordered in ED Medications - No data to display  ED Course/ Medical Decision Making/ A&P   {   Click here for ABCD2, HEART and other calculatorsREFRESH Note before signing :1}                              Medical Decision Making Amount and/or Complexity of Data Reviewed Labs: ordered. Radiology: ordered.   ***  {Document critical care time when appropriate:1} {Document review of labs and clinical decision tools ie heart score, Chads2Vasc2 etc:1}  {Document your independent review of radiology images, and any outside records:1} {Document your discussion with family members, caretakers, and with consultants:1} {Document social determinants of health affecting pt's care:1} {Document your decision making why or why not admission, treatments were needed:1} Final Clinical Impression(s) / ED Diagnoses Final diagnoses:  None    Rx / DC Orders ED Discharge Orders     None

## 2023-07-10 ENCOUNTER — Ambulatory Visit: Payer: MEDICAID | Admitting: Neurology

## 2023-07-10 ENCOUNTER — Encounter: Payer: Self-pay | Admitting: Neurology

## 2023-08-30 ENCOUNTER — Encounter (HOSPITAL_COMMUNITY): Payer: Self-pay

## 2023-08-30 ENCOUNTER — Emergency Department (HOSPITAL_COMMUNITY): Payer: MEDICAID

## 2023-08-30 ENCOUNTER — Observation Stay (HOSPITAL_COMMUNITY)
Admission: EM | Admit: 2023-08-30 | Discharge: 2023-08-31 | Disposition: A | Discharge status: Home / Self Care | Payer: MEDICAID | Attending: Internal Medicine | Admitting: Internal Medicine

## 2023-08-30 ENCOUNTER — Other Ambulatory Visit: Payer: Self-pay

## 2023-08-30 DIAGNOSIS — R569 Unspecified convulsions: Secondary | ICD-10-CM | POA: Diagnosis not present

## 2023-08-30 DIAGNOSIS — F17210 Nicotine dependence, cigarettes, uncomplicated: Secondary | ICD-10-CM | POA: Diagnosis not present

## 2023-08-30 DIAGNOSIS — F1410 Cocaine abuse, uncomplicated: Secondary | ICD-10-CM | POA: Diagnosis not present

## 2023-08-30 DIAGNOSIS — F1010 Alcohol abuse, uncomplicated: Secondary | ICD-10-CM | POA: Diagnosis not present

## 2023-08-30 DIAGNOSIS — D72829 Elevated white blood cell count, unspecified: Secondary | ICD-10-CM | POA: Insufficient documentation

## 2023-08-30 DIAGNOSIS — F1910 Other psychoactive substance abuse, uncomplicated: Secondary | ICD-10-CM | POA: Diagnosis not present

## 2023-08-30 DIAGNOSIS — E8721 Acute metabolic acidosis: Secondary | ICD-10-CM | POA: Insufficient documentation

## 2023-08-30 DIAGNOSIS — E876 Hypokalemia: Secondary | ICD-10-CM | POA: Insufficient documentation

## 2023-08-30 LAB — COMPREHENSIVE METABOLIC PANEL
ALT: 22 U/L (ref 0–44)
AST: 35 U/L (ref 15–41)
Albumin: 4.5 g/dL (ref 3.5–5.0)
Alkaline Phosphatase: 87 U/L (ref 38–126)
Anion gap: 18 — ABNORMAL HIGH (ref 5–15)
BUN: 8 mg/dL (ref 6–20)
CO2: 19 mmol/L — ABNORMAL LOW (ref 22–32)
Calcium: 8.9 mg/dL (ref 8.9–10.3)
Chloride: 98 mmol/L (ref 98–111)
Creatinine, Ser: 0.82 mg/dL (ref 0.44–1.00)
GFR, Estimated: >60 mL/min (ref >60)
Glucose, Bld: 150 mg/dL — ABNORMAL HIGH (ref 70–99)
Potassium: 3.3 mmol/L — ABNORMAL LOW (ref 3.5–5.1)
Sodium: 135 mmol/L (ref 135–145)
Total Bilirubin: 0.6 mg/dL (ref <1.2)
Total Protein: 8.8 g/dL — ABNORMAL HIGH (ref 6.5–8.1)

## 2023-08-30 LAB — CBC WITH DIFFERENTIAL/PLATELET
Abs Immature Granulocytes: 0.07 10*3/uL (ref 0.00–0.07)
Basophils Absolute: 0 10*3/uL (ref 0.0–0.1)
Basophils Relative: 0 %
Eosinophils Absolute: 0.1 10*3/uL (ref 0.0–0.5)
Eosinophils Relative: 0 %
HCT: 48.8 % — ABNORMAL HIGH (ref 36.0–46.0)
Hemoglobin: 15.3 g/dL — ABNORMAL HIGH (ref 12.0–15.0)
Immature Granulocytes: 1 %
Lymphocytes Relative: 20 %
Lymphs Abs: 3 10*3/uL (ref 0.7–4.0)
MCH: 28 pg (ref 26.0–34.0)
MCHC: 31.4 g/dL (ref 30.0–36.0)
MCV: 89.2 fL (ref 80.0–100.0)
Monocytes Absolute: 1.2 10*3/uL — ABNORMAL HIGH (ref 0.1–1.0)
Monocytes Relative: 8 %
Neutro Abs: 10.7 10*3/uL — ABNORMAL HIGH (ref 1.7–7.7)
Neutrophils Relative %: 71 %
Platelets: 240 10*3/uL (ref 150–400)
RBC: 5.47 MIL/uL — ABNORMAL HIGH (ref 3.87–5.11)
RDW: 14.8 % (ref 11.5–15.5)
WBC: 15 10*3/uL — ABNORMAL HIGH (ref 4.0–10.5)
nRBC: 0 % (ref 0.0–0.2)

## 2023-08-30 LAB — RAPID URINE DRUG SCREEN, HOSP PERFORMED
Amphetamines: NOT DETECTED
Barbiturates: NOT DETECTED
Benzodiazepines: NOT DETECTED
Cocaine: POSITIVE — AB
Opiates: NOT DETECTED
Tetrahydrocannabinol: NOT DETECTED

## 2023-08-30 LAB — ETHANOL: Alcohol, Ethyl (B): <10 mg/dL (ref <10)

## 2023-08-30 MED ORDER — LORAZEPAM 2 MG/ML IJ SOLN
INTRAMUSCULAR | Status: AC
  Administered 2023-08-30: 1 mg via INTRAVENOUS
  Filled 2023-08-30: qty 1

## 2023-08-30 MED ORDER — ENOXAPARIN SODIUM 40 MG/0.4ML IJ SOSY
40.0000 mg | PREFILLED_SYRINGE | INTRAMUSCULAR | Status: DC
  Administered 2023-08-31: 40 mg via SUBCUTANEOUS
  Filled 2023-08-30: qty 0.4

## 2023-08-30 MED ORDER — ADULT MULTIVITAMIN W/MINERALS CH
1.0000 | ORAL_TABLET | Freq: Every day | ORAL | Status: DC
  Administered 2023-08-30 – 2023-08-31 (×2): 1 via ORAL
  Filled 2023-08-30 (×2): qty 1

## 2023-08-30 MED ORDER — ALBUTEROL SULFATE (2.5 MG/3ML) 0.083% IN NEBU: 2.5000 mg | INHALATION_SOLUTION | RESPIRATORY_TRACT | Status: DC | PRN

## 2023-08-30 MED ORDER — ACETAMINOPHEN 650 MG RE SUPP: 650.0000 mg | Freq: Four times a day (QID) | RECTAL | Status: DC | PRN

## 2023-08-30 MED ORDER — ONDANSETRON HCL 4 MG/2ML IJ SOLN: 4.0000 mg | Freq: Four times a day (QID) | INTRAMUSCULAR | Status: DC | PRN

## 2023-08-30 MED ORDER — FOLIC ACID 1 MG PO TABS
1.0000 mg | ORAL_TABLET | Freq: Every day | ORAL | Status: DC
Start: 2023-08-30 — End: 2023-08-31
  Administered 2023-08-30: 1 mg via ORAL
  Filled 2023-08-30: qty 1

## 2023-08-30 MED ORDER — NICOTINE 14 MG/24HR TD PT24
14.0000 mg | MEDICATED_PATCH | Freq: Every day | TRANSDERMAL | Status: DC
  Administered 2023-08-31: 14 mg via TRANSDERMAL
  Filled 2023-08-30: qty 1

## 2023-08-30 MED ORDER — VALPROATE SODIUM 100 MG/ML IV SOLN
1000.0000 mg | Freq: Once | INTRAVENOUS | Status: AC
  Administered 2023-08-30: 1000 mg via INTRAVENOUS
  Filled 2023-08-30 (×2): qty 10

## 2023-08-30 MED ORDER — ONDANSETRON HCL 4 MG PO TABS: 4.0000 mg | ORAL_TABLET | Freq: Four times a day (QID) | ORAL | Status: DC | PRN

## 2023-08-30 MED ORDER — ACETAMINOPHEN 325 MG PO TABS
650.0000 mg | ORAL_TABLET | Freq: Four times a day (QID) | ORAL | Status: DC | PRN
  Administered 2023-08-30: 650 mg via ORAL
  Filled 2023-08-30: qty 2

## 2023-08-30 MED ORDER — VALPROATE SODIUM 100 MG/ML IV SOLN
500.0000 mg | Freq: Two times a day (BID) | INTRAVENOUS | Status: DC
  Administered 2023-08-30 – 2023-08-31 (×2): 500 mg via INTRAVENOUS
  Filled 2023-08-30 (×4): qty 5

## 2023-08-30 MED ORDER — LORAZEPAM 2 MG/ML IJ SOLN
1.0000 mg | Freq: Once | INTRAMUSCULAR | Status: AC
Start: 2023-08-30 — End: 2023-08-30

## 2023-08-30 MED ORDER — LACTATED RINGERS IV SOLN: INTRAVENOUS | Status: AC

## 2023-08-30 MED ORDER — LORAZEPAM 2 MG/ML IJ SOLN: 1.0000 mg | INTRAMUSCULAR | Status: DC | PRN

## 2023-08-30 MED ORDER — GABAPENTIN 300 MG PO CAPS
300.0000 mg | ORAL_CAPSULE | Freq: Every day | ORAL | Status: DC
  Administered 2023-08-30: 300 mg via ORAL
  Filled 2023-08-30: qty 1

## 2023-08-30 MED ORDER — SENNOSIDES-DOCUSATE SODIUM 8.6-50 MG PO TABS: 1.0000 | ORAL_TABLET | Freq: Every evening | ORAL | Status: DC | PRN

## 2023-08-30 MED ORDER — THIAMINE HCL 100 MG/ML IJ SOLN
500.0000 mg | Freq: Two times a day (BID) | INTRAVENOUS | Status: DC
  Administered 2023-08-30 – 2023-08-31 (×2): 500 mg via INTRAVENOUS
  Filled 2023-08-30 (×3): qty 5

## 2023-08-30 MED ORDER — LORAZEPAM 1 MG PO TABS
1.0000 mg | ORAL_TABLET | ORAL | Status: DC | PRN
Start: 2023-08-30 — End: 2023-09-02

## 2023-08-30 MED ORDER — POTASSIUM CHLORIDE 10 MEQ/100ML IV SOLN
10.0000 meq | INTRAVENOUS | Status: AC
  Administered 2023-08-30 (×2): 10 meq via INTRAVENOUS
  Filled 2023-08-30 (×2): qty 100

## 2023-08-30 NOTE — ED Notes (Signed)
Nurse was notified about her temp

## 2023-08-30 NOTE — ED Triage Notes (Signed)
EMS reports from home, multiple seizures this morning per husband, Hx of same, Pt non-compliant with meds. Pt arrives post ictal.  BP 146/84 HR 110 RR 18 CBG 118

## 2023-08-30 NOTE — ED Provider Notes (Signed)
[place] EMERGENCY DEPARTMENT AT [facility] Provider Note   CSN: [id number] Arrival date & time: 08/30/23  4098     History {Add pertinent medical, surgical, social history, OB history to HPI:1} Chief Complaint  Patient presents with   Seizures    Theresa Rios is a 57 y.o. female.  Patient has a 3 of seizures.  She has not been taking her Depakote for a long time.  She also has a history of cocaine abuse and alcohol abuse.  She had multiple seizures at home today according to her husband.   Seizures      Home Medications Prior to Admission medications   Medication Sig Start Date End Date Taking? Authorizing Provider  cyclobenzaprine (FLEXERIL) 10 MG tablet Take 10 mg by mouth daily.    [provider]  divalproex (DEPAKOTE) 500 MG DR tablet Take 1 tablet (500 mg total) by mouth every 12 (twelve) hours. 05/05/23   Arthor Captain, PA-C  folic acid (FOLVITE) 1 MG tablet Take 1 tablet (1 mg total) by mouth daily. 01/25/23   Elgergawy, Leana Roe, MD  gabapentin (NEURONTIN) 300 MG capsule Take 1 capsule (300 mg total) by mouth at bedtime. 01/24/23   Elgergawy, Leana Roe, MD  Multiple Vitamin (MULTIVITAMIN WITH MINERALS) TABS tablet Take 1 tablet by mouth daily. 01/25/23   Elgergawy, Leana Roe, MD  thiamine (VITAMIN B1) 100 MG tablet Take 1 tablet (100 mg total) by mouth daily. 01/24/23   Elgergawy, Leana Roe, MD      Allergies    Patient has no known allergies.    Review of Systems   Review of Systems  Neurological:  Positive for seizures.    Physical Exam Updated Vital Signs BP (!) 142/78   Pulse 77   Temp 98.4 F (36.9 C)   Resp 18   LMP 05/17/2014   SpO2 98%  Physical Exam  ED Results / Procedures / Treatments   Labs (all labs ordered are listed, but only abnormal results are displayed) Labs Reviewed  CBC WITH DIFFERENTIAL/PLATELET - Abnormal; Notable for the following components:      Result Value   WBC 15.0 (*)    RBC 5.47 (*)     Hemoglobin 15.3 (*)    HCT 48.8 (*)    Neutro Abs 10.7 (*)    Monocytes Absolute 1.2 (*)    All other components within normal limits  COMPREHENSIVE METABOLIC PANEL - Abnormal; Notable for the following components:   Potassium 3.3 (*)    CO2 19 (*)    Glucose, Bld 150 (*)    Total Protein 8.8 (*)    Anion gap 18 (*)    All other components within normal limits  RAPID URINE DRUG SCREEN, HOSP PERFORMED - Abnormal; Notable for the following components:   Cocaine POSITIVE (*)    All other components within normal limits  ETHANOL    EKG None  Radiology CT Head Wo Contrast Result Date: 08/30/2023 CLINICAL DATA:  Provided history: Head trauma, abnormal mental status. Seizure. EXAM: CT HEAD WITHOUT CONTRAST TECHNIQUE: Contiguous axial images were obtained from the base of the skull through the vertex without intravenous contrast. RADIATION DOSE REDUCTION: This exam was performed according to the departmental dose-optimization program which includes automated exposure control, adjustment of the mA and/or kV according to patient size and/or use of iterative reconstruction technique. COMPARISON:  Head CT 07/08/2023. FINDINGS: Mildly motion degraded exam. Within this limitation, findings are as follows. Brain: No age advanced or lobar  predominant parenchymal atrophy. There is no acute intracranial hemorrhage. No demarcated cortical infarct. No extra-axial fluid collection. No evidence of an intracranial mass. No midline shift. Vascular: No hyperdense vessel.  Atherosclerotic calcifications. Skull: No calvarial fracture or aggressive osseous lesion. Sinuses/Orbits: No mass or acute finding within the imaged orbits. Known chronic medially displaced fracture deformity of the right lamina papyracea. Bilateral maxillary sinusitis (moderate-to-severe right, moderate left). Mild mucosal thickening within the left sphenoid sinus, bilateral ethmoid sinuses and bilateral frontal sinuses. Other: Known chronic  fracture deformity of the left zygomatic arch. IMPRESSION: 1.  No evidence of an acute intracranial abnormality. 2. Paranasal sinus disease as described. Electronically Signed   By: Jackey Loge D.O.   On: 08/30/2023 12:15    Procedures Procedures  {Document cardiac monitor, telemetry assessment procedure when appropriate:1}  Medications Ordered in ED Medications  potassium chloride 10 mEq in 100 mL IVPB (has no administration in time range)  lactated ringers infusion (has no administration in time range)  thiamine (VITAMIN B1) 500 mg in sodium chloride 0.9 % 50 mL IVPB (has no administration in time range)  valproate (DEPACON) 1,000 mg in dextrose 5 % 50 mL IVPB (0 mg Intravenous Stopped 08/30/23 1214)  LORazepam (ATIVAN) injection 1 mg (1 mg Intravenous Given 08/30/23 1053)    ED Course/ Medical Decision Making/ A&P  Patient had another seizure in the emergency department in the CT scanner.  She was given Ativan and Depakote. {   Click here for ABCD2, HEART and other calculatorsREFRESH Note before signing :1}                              Medical Decision Making Amount and/or Complexity of Data Reviewed Labs: ordered. Radiology: ordered.  Risk Prescription drug management. Decision regarding hospitalization.   Patient will be admitted for seizure secondary to noncompliance and substance abuse.  {Document critical care time when appropriate:1} {Document review of labs and clinical decision tools ie heart score, Chads2Vasc2 etc:1}  {Document your independent review of radiology images, and any outside records:1} {Document your discussion with family members, caretakers, and with consultants:1} {Document social determinants of health affecting pt's care:1} {Document your decision making why or why not admission, treatments were needed:1} Final Clinical Impression(s) / ED Diagnoses Final diagnoses:  Seizure (HCC)    Rx / DC Orders ED Discharge Orders     None

## 2023-08-30 NOTE — ED Notes (Signed)
PT refusing second IV line at this time.

## 2023-08-30 NOTE — H&P (Signed)
History and Physical    Patient: Theresa Rios BJS:[id number] DOB: Jan 11, 1966 DOA: 08/30/2023 DOS: the patient was seen and examined on 08/30/2023 PCP: Lavinia Sharps, NP  Patient coming from: Home  Chief Complaint:  Chief Complaint  Patient presents with   Seizures   HPI: Theresa Rios is a 57 y.o. female with medical history significant of seizure disorder, chronic cocaine abuse, history of alcohol use, history of depression, history of recurrent seizures episode, noncompliant to medication last hospitalization 58-month ago for the same. Presents with recurrent seizures episode at home.  Patient on arrival to the ED was postictal but subsequently wake up.  Patient went to CT and during CT she was noted to have another seizure episode.  She received Ativan and she will be loaded with Depakote. Patient seen in the ED, unable to provide history as she only said "mam leave me alone"  Patient present with recurrent seizure episodes at home, patient unable to provide history at this time.  She also had seizure episode while she was in radiology getting a CT head.  Episode lasted 15 seconds she had generalized tremors of foam in her mouth.  She received Ativan and she was loaded with Depakote.  Evaluation in the ED; sodium 135, potassium 3.3, CO2 19, glucose 150, anion gap 18, hemoglobin 15, white blood cell 15, UDS positive for cocaine.  CT head no acute intracranial pathology, sinusitis.  Chest x-ray mild bibasilar atelectasis.     Review of Systems: As mentioned in the history of present illness. All other systems reviewed and are negative. Past Medical History:  Diagnosis Date   Seizures (HCC)    Suicide attempt by drug ingestion (HCC) 03/19/2022   History reviewed. No pertinent surgical history. Social History:  reports that she has been smoking cigarettes. She has never used smokeless tobacco. She reports current alcohol use of about 10.0 standard drinks of alcohol per week. She  reports that she does not currently use drugs.  No Known Allergies  History reviewed. No pertinent family history.  Prior to Admission medications   Medication Sig Start Date End Date Taking? Authorizing Provider  cyclobenzaprine (FLEXERIL) 10 MG tablet Take 10 mg by mouth daily.    [provider]  divalproex (DEPAKOTE) 500 MG DR tablet Take 1 tablet (500 mg total) by mouth every 12 (twelve) hours. 05/05/23   Arthor Captain, PA-C  folic acid (FOLVITE) 1 MG tablet Take 1 tablet (1 mg total) by mouth daily. 01/25/23   Elgergawy, Leana Roe, MD  gabapentin (NEURONTIN) 300 MG capsule Take 1 capsule (300 mg total) by mouth at bedtime. 01/24/23   Elgergawy, Leana Roe, MD  Multiple Vitamin (MULTIVITAMIN WITH MINERALS) TABS tablet Take 1 tablet by mouth daily. 01/25/23   Elgergawy, Leana Roe, MD  thiamine (VITAMIN B1) 100 MG tablet Take 1 tablet (100 mg total) by mouth daily. 01/24/23   Elgergawy, Leana Roe, MD    Physical Exam: Vitals:   08/30/23 1055 08/30/23 1145 08/30/23 1200 08/30/23 1300  BP: (!) 152/109  (!) 148/88 (!) 142/78  Pulse: 96 78 78 77  Resp: 20 19 19 18   Temp:    98.4 F (36.9 C)  TempSrc:      SpO2: 100% 96% 98% 98%   General: Patient is lying down in bed in fetal position, she reposition herself multiple times. CVS; S1-S2 regular rhythm and rate Lungs; lungs bilateral rhonchus no increased work of breathing Abdomen; bowel sounds present, soft, nontender, nondistended Extremities.  No  edema Neuroexam: She keeps her eyes closed, reposition herself multiple times in bed, not answering questions, she said ma'am  leave me alone  Data Reviewed:  Labs reviewed.   Assessment and Plan: No notes have been filed under this hospital service. Service: Hospitalist  1-Recurrent Seizures Episodes in setting of possible non compliance, and drug abuse (cocaine) alcohol withdrawal >? Patient presents with multiple seizures episode.  She also had an episode in MRI department that  lasted 15 seconds.  She received Ativan and low-dose of Depakote after Dr. Russella Dar with neurology recommended resume home seizure medication and give a loading dose to patient.  Also recommended CIWA protocol -I will admit patient to [facility] in case that she developed recurrent seizures episode and required formal neurology evaluation and or continuous EEG. -CT head no acute intracranial abnormality  -NP sips with meds.  -IV Depacon  500 mg IV BID.  -Seizure precaution.  -currently post ictal, protecting airway.  Resume gabapentin/   2-History alcohol abuse;  Alcohol level less than 10.  Monitor on CIWA.  IV Thiamine 500 mg IV BID.   3-Hypokalemia; Replete IV>   Metabolic acidosis; in setting of seizures.  IV fluids.   Leukocytosis. Check UA.  Chest x ray : Mild bibasilar atelectasis.     Advance Care Planning:   Code Status: Prior full code  Consults: Phone consultation  Family Communication: no family at bedside.   Severity of Illness: The appropriate patient status for this patient is OBSERVATION. Observation status is judged to be reasonable and necessary in order to provide the required intensity of service to ensure the patient's safety. The patient's presenting symptoms, physical exam findings, and initial radiographic and laboratory data in the context of their medical condition is felt to place them at decreased risk for further clinical deterioration. Furthermore, it is anticipated that the patient will be medically stable for discharge from the hospital within 2 midnights of admission.   Author: Alba Cory, MD 08/30/2023 2:12 PM  For on call review [email or web address].

## 2023-08-31 DIAGNOSIS — R569 Unspecified convulsions: Secondary | ICD-10-CM | POA: Diagnosis not present

## 2023-08-31 LAB — CBC
HCT: 44.3 % (ref 36.0–46.0)
Hemoglobin: 14.2 g/dL (ref 12.0–15.0)
MCH: 27.5 pg (ref 26.0–34.0)
MCHC: 32.1 g/dL (ref 30.0–36.0)
MCV: 85.7 fL (ref 80.0–100.0)
Platelets: 193 10*3/uL (ref 150–400)
RBC: 5.17 MIL/uL — ABNORMAL HIGH (ref 3.87–5.11)
RDW: 14.6 % (ref 11.5–15.5)
WBC: 12.5 10*3/uL — ABNORMAL HIGH (ref 4.0–10.5)
nRBC: 0 % (ref 0.0–0.2)

## 2023-08-31 LAB — COMPREHENSIVE METABOLIC PANEL
ALT: 15 U/L (ref 0–44)
AST: 17 U/L (ref 15–41)
Albumin: 3.8 g/dL (ref 3.5–5.0)
Alkaline Phosphatase: 65 U/L (ref 38–126)
Anion gap: 10 (ref 5–15)
BUN: 8 mg/dL (ref 6–20)
CO2: 24 mmol/L (ref 22–32)
Calcium: 9.4 mg/dL (ref 8.9–10.3)
Chloride: 102 mmol/L (ref 98–111)
Creatinine, Ser: 0.52 mg/dL (ref 0.44–1.00)
GFR, Estimated: >60 mL/min (ref >60)
Glucose, Bld: 97 mg/dL (ref 70–99)
Potassium: 3.1 mmol/L — ABNORMAL LOW (ref 3.5–5.1)
Sodium: 136 mmol/L (ref 135–145)
Total Bilirubin: 1.3 mg/dL — ABNORMAL HIGH (ref <1.2)
Total Protein: 7.6 g/dL (ref 6.5–8.1)

## 2023-08-31 LAB — MAGNESIUM: Magnesium: 2.5 mg/dL — ABNORMAL HIGH (ref 1.7–2.4)

## 2023-08-31 LAB — HIV ANTIBODY (ROUTINE TESTING W REFLEX): HIV Screen 4th Generation wRfx: NONREACTIVE

## 2023-08-31 MED ORDER — NICOTINE 14 MG/24HR TD PT24: 14.0000 mg | MEDICATED_PATCH | Freq: Every day | TRANSDERMAL | 0 refills | Status: DC

## 2023-08-31 MED ORDER — DIVALPROEX SODIUM 500 MG PO DR TAB: 500.0000 mg | DELAYED_RELEASE_TABLET | Freq: Two times a day (BID) | ORAL | 2 refills | Status: DC

## 2023-08-31 MED ORDER — DIVALPROEX SODIUM 500 MG PO DR TAB: 500.0000 mg | DELAYED_RELEASE_TABLET | Freq: Two times a day (BID) | ORAL | Status: DC

## 2023-08-31 MED ORDER — ADULT MULTIVITAMIN W/MINERALS CH: 1.0000 | ORAL_TABLET | Freq: Every day | ORAL | Status: DC

## 2023-08-31 MED ORDER — POTASSIUM CHLORIDE CRYS ER 20 MEQ PO TBCR
40.0000 meq | EXTENDED_RELEASE_TABLET | Freq: Once | ORAL | Status: AC
  Administered 2023-08-31: 40 meq via ORAL
  Filled 2023-08-31: qty 2

## 2023-08-31 MED ORDER — THIAMINE HCL 100 MG PO TABS: 100.0000 mg | ORAL_TABLET | Freq: Every day | ORAL | Status: DC

## 2023-08-31 MED ORDER — FOLIC ACID 1 MG PO TABS
1.0000 mg | ORAL_TABLET | Freq: Every day | ORAL | Status: DC
  Administered 2023-08-31: 1 mg via ORAL
  Filled 2023-08-31: qty 1

## 2023-08-31 NOTE — ED Notes (Signed)
Pt ambulated to bathroom 

## 2023-08-31 NOTE — ED Notes (Signed)
Pt sleeping, but arousable to voice. Pt is alert and oriented to place, but disoriented to time or situation. AM labs drawn and sentt to the lab. Pt on cardiac monitor and VSS. PT has no questions or concers at this time and denies any pain or discomfort

## 2023-08-31 NOTE — Discharge Summary (Signed)
Physician Discharge Summary  Theresa Rios ZOX:[id number] DOB: 01/28/1966 DOA: 08/30/2023  PCP: Lavinia Sharps, NP  Admit date: 08/30/2023 Discharge date: 08/31/2023  Admitted From: Home  Discharge disposition: Home   Recommendations for Outpatient Follow-Up:   Follow up with your primary care provider in one week.  Check CBC, BMP, magnesium in the next visit Was extensively counseled regarding the need for compliance with medications, quitting cocaine and polysubstance.   Discharge Diagnosis:   Principal Problem:   Seizure [facility])   Discharge Condition: Improved.  Diet recommendation:   Regular.  Wound care: None.  Code status: Full.   History of Present Illness:   Theresa Rios is a 57 y.o. female with medical history significant of seizure disorder, chronic cocaine abuse, history of alcohol use, history of depression, history of recurrent seizures noncompliant to medication last hospitalization 39-month ago for the same.  Presented to hospital with recurrent seizures at home including in the CT unit while having CT scan.  Initial labs were notable for sodium 135, potassium 3.3, CO2 19, glucose 150, anion gap 18, hemoglobin 15, white blood cell 15, UDS positive for cocaine.  CT head no acute intracranial pathology, sinusitis.  Chest x-ray mild bibasilar atelectasis.  Patient was then considered for admission to the hospital.  Hospital Course:   Following conditions were addressed during hospitalization as listed below,  Recurrent Seizures Episodes in setting of possible non compliance with antiseizure medication, and drug abuse (cocaine)/alcohol  During hospitalization patient received Ativan and  Depakote.  Neurology Dr. Russella Dar was consulted who recommended loading dose of seizure medications and monitoring while in the hospital.   CT head scan was negative.  Patient was not taking Depakote at home.  Has not had seizure in the last 24 hours.  Will continue Depakote  on discharge.  Prescriptions have been refilled again.  Advised not to drink alcohol, use illicit substances and drive.  Recommended follow-up with outpatient PCP   Polysubstance abuse.   Alcohol use disorder, cocaine abuse, tobacco dependence. Alcohol level less than 10.  Patient states she drinks 1 or 2 beer a day.  No obvious withdrawal symptoms noted during hospitalization..  Will prescribe nicotine patch on discharge   Hypokalemia; potassium of 3.1 today potassium was replenished prior to discharge.  Recommend follow-up with PCP as outpatient.   Metabolic acidosis; in setting of seizures.  Resolved with IV fluids.   Leukocytosis likely reactive.  Trended down today. Chest x ray : Mild bibasilar atelectasis.  No need for antibiotic  Disposition.  At this time, patient is stable for disposition home with outpatient PCP follow-up.  Medical Consultants:   Verbal consult with neurology  Procedures:    None Subjective:   Today, patient was seen and examined at bedside.  No further seizures reported since admission.  Patient was extensively counseled the importance of being on medications and avoiding cocaine and illicit substances.  Discharge Exam:   Vitals:   08/31/23 0900 08/31/23 1053  BP: 128/83 135/77  Pulse: 65 72  Resp: 18   Temp:  98.3 F (36.8 C)  SpO2: 97% 98%   Vitals:   08/31/23 0800 08/31/23 0811 08/31/23 0900 08/31/23 1053  BP: 113/77  128/83 135/77  Pulse: 66  65 72  Resp: 16 13 18    Temp:  98.3 F (36.8 C)  98.3 F (36.8 C)  TempSrc:  Oral  Oral  SpO2: 94%  97% 98%   There is no height or weight on file  to calculate BMI.   General: Alert awake, not in obvious distress, Communicative HENT: pupils equally reacting to light,  No scleral pallor or icterus noted. Oral mucosa is moist.  Chest:  Clear breath sounds. No crackles or wheezes.  CVS: S1 &S2 heard. No murmur.  Regular rate and rhythm. Abdomen: Soft, nontender, nondistended.  Bowel sounds are  heard.   Extremities: No cyanosis, clubbing or edema.  Peripheral pulses are palpable. Psych: Alert, awake and Communicative. CNS:  No cranial nerve deficits.  Power equal in all extremities.   Skin: Warm and dry.  No rashes noted.  The results of significant diagnostics from this hospitalization (including imaging, microbiology, ancillary and laboratory) are listed below for reference.     Diagnostic Studies:   CT Head Wo Contrast Result Date: 08/30/2023 CLINICAL DATA:  Provided history: Head trauma, abnormal mental status. Seizure. EXAM: CT HEAD WITHOUT CONTRAST TECHNIQUE: Contiguous axial images were obtained from the base of the skull through the vertex without intravenous contrast. RADIATION DOSE REDUCTION: This exam was performed according to the departmental dose-optimization program which includes automated exposure control, adjustment of the mA and/or kV according to patient size and/or use of iterative reconstruction technique. COMPARISON:  Head CT 07/08/2023. FINDINGS: Mildly motion degraded exam. Within this limitation, findings are as follows. Brain: No age advanced or lobar predominant parenchymal atrophy. There is no acute intracranial hemorrhage. No demarcated cortical infarct. No extra-axial fluid collection. No evidence of an intracranial mass. No midline shift. Vascular: No hyperdense vessel.  Atherosclerotic calcifications. Skull: No calvarial fracture or aggressive osseous lesion. Sinuses/Orbits: No mass or acute finding within the imaged orbits. Known chronic medially displaced fracture deformity of the right lamina papyracea. Bilateral maxillary sinusitis (moderate-to-severe right, moderate left). Mild mucosal thickening within the left sphenoid sinus, bilateral ethmoid sinuses and bilateral frontal sinuses. Other: Known chronic fracture deformity of the left zygomatic arch. IMPRESSION: 1.  No evidence of an acute intracranial abnormality. 2. Paranasal sinus disease as described.  Electronically Signed   By: Jackey Loge D.O.   On: 08/30/2023 12:15     Labs:   Basic Metabolic Panel: Recent Labs  Lab 08/30/23 1019 08/31/23 0452  NA 135 136  K 3.3* 3.1*  CL 98 102  CO2 19* 24  GLUCOSE 150* 97  BUN 8 8  CREATININE 0.82 0.52  CALCIUM 8.9 9.4  MG  --  2.5*   GFR CrCl cannot be calculated (Unknown ideal weight.). Liver Function Tests: Recent Labs  Lab 08/30/23 1019 08/31/23 0452  AST 35 17  ALT 22 15  ALKPHOS 87 65  BILITOT 0.6 1.3*  PROT 8.8* 7.6  ALBUMIN 4.5 3.8   No results for input(s): "LIPASE", "AMYLASE" in the last 168 hours. No results for input(s): "AMMONIA" in the last 168 hours. Coagulation profile No results for input(s): "INR", "PROTIME" in the last 168 hours.  CBC: Recent Labs  Lab 08/30/23 1019 08/31/23 0452  WBC 15.0* 12.5*  NEUTROABS 10.7*  --   HGB 15.3* 14.2  HCT 48.8* 44.3  MCV 89.2 85.7  PLT 240 193   Cardiac Enzymes: No results for input(s): "CKTOTAL", "CKMB", "CKMBINDEX", "TROPONINI" in the last 168 hours. BNP: Invalid input(s): "POCBNP" CBG: No results for input(s): "GLUCAP" in the last 168 hours. D-Dimer No results for input(s): "DDIMER" in the last 72 hours. Hgb A1c No results for input(s): "HGBA1C" in the last 72 hours. Lipid Profile No results for input(s): "CHOL", "HDL", "LDLCALC", "TRIG", "CHOLHDL", "LDLDIRECT" in the last 72 hours. Thyroid function  studies No results for input(s): "TSH", "T4TOTAL", "T3FREE", "THYROIDAB" in the last 72 hours.  Invalid input(s): "FREET3" Anemia work up No results for input(s): "VITAMINB12", "FOLATE", "FERRITIN", "TIBC", "IRON", "RETICCTPCT" in the last 72 hours. Microbiology No results found for this or any previous visit (from the past 240 hours).   Discharge Instructions:   Discharge Instructions     Diet general   Complete by: As directed    Discharge instructions   Complete by: As directed    Follow-up with your primary care provider in 1 week.   Continue to take seizure medication without interruption at home.  Please do not smoke,use illicit substance  or drink alcohol.  Seek medical attention for worsening symptoms.   Increase activity slowly   Complete by: As directed       Allergies as of 08/31/2023   No Known Allergies      Medication List     TAKE these medications    CertaVite/Antioxidants Tabs Take 1 tablet by mouth daily.   cyclobenzaprine 10 MG tablet Commonly known as: FLEXERIL Take 10 mg by mouth daily.   divalproex 500 MG DR tablet Commonly known as: DEPAKOTE Take 1 tablet (500 mg total) by mouth every 12 (twelve) hours.   folic acid 1 MG tablet Commonly known as: FOLVITE Take 1 tablet (1 mg total) by mouth daily.   gabapentin 300 MG capsule Commonly known as: NEURONTIN Take 1 capsule (300 mg total) by mouth at bedtime.   nicotine 14 mg/24hr patch Commonly known as: NICODERM CQ - dosed in mg/24 hours Place 1 patch (14 mg total) onto the skin daily. Start taking on: September 01, 2023   thiamine 100 MG tablet Commonly known as: VITAMIN B1 Take 1 tablet (100 mg total) by mouth daily.        Follow-up Information     Placey, Chales Abrahams, NP Follow up in 1 week(s).   Contact information: [address] [place] [address] [phone]                  Time coordinating discharge: 39 minutes  Signed:  Kinya Meine  [facility] 08/31/2023, 4:54 PM

## 2023-10-17 ENCOUNTER — Other Ambulatory Visit: Payer: Self-pay | Admitting: Nurse Practitioner

## 2023-10-17 NOTE — Telephone Encounter (Signed)
 Please do not send Rx to [facility]. Pt will continue to use [facility]. Pt aware there is a refill of Depakote  at [facility].  [facility] does not accept her medicaid.Aaron Aas

## 2023-10-17 NOTE — Telephone Encounter (Unsigned)
 Copied from CRM [id number]. Topic: Clinical - Medication Refill >> Oct 17, 2023  1:50 PM Georgia RAMAN wrote: Most Recent Primary Care Visit:  Provider: CELESTIA ROSALINE SQUIBB  Department: [facility]  Visit Type: NEW PATIENT  Date: 04/22/2023  Medication: divalproex  (DEPAKOTE ) 500 MG DR tablet [[id number]]  Has the patient contacted their pharmacy? Yes (Agent: If no, request that the patient contact the pharmacy for the refill. If patient does not wish to contact the pharmacy document the reason why and proceed with request.) (Agent: If yes, when and what did the pharmacy advise?)  Is this the correct pharmacy for this prescription? Yes If no, delete pharmacy and type the correct one.  This is the patient's preferred pharmacy:  [facility] - [place], [place] - [address] [place] [address] Phone: [phone] Fax: [phone]    Has the prescription been filled recently? No  Is the patient out of the medication? Yes  Has the patient been seen for an appointment in the last year OR does the patient have an upcoming appointment? Yes  Can we respond through MyChart? Yes  Agent: Please be advised that Rx refills may take up to 3 business days. We ask that you follow-up with your pharmacy.

## 2023-10-27 ENCOUNTER — Ambulatory Visit: Payer: MEDICAID | Admitting: Neurology

## 2023-10-27 ENCOUNTER — Telehealth: Payer: Self-pay | Admitting: Neurology

## 2023-10-27 NOTE — Telephone Encounter (Signed)
Pt called in tears because transportation said they could not find her to bring to appointment today. Pt said homeless, but stayed with someone because it was raining the weekend. Pt said have had several seizures and really need to see what's going on with my head.

## 2023-10-27 NOTE — Telephone Encounter (Signed)
Pt reports that her transportation did not pick her up this morning, she is asking if she can be rescheduled.  This is her 2nd no show for her New Pt appointment, please advise.

## 2023-10-27 NOTE — Telephone Encounter (Signed)
Call to patient, rescheduled her for 2/19 at 9:15

## 2023-10-28 ENCOUNTER — Ambulatory Visit (INDEPENDENT_AMBULATORY_CARE_PROVIDER_SITE_OTHER): Payer: MEDICAID | Admitting: Neurology

## 2023-10-28 ENCOUNTER — Encounter: Payer: Self-pay | Admitting: Neurology

## 2023-10-28 VITALS — BP 100/49 | HR 98 | Ht 62.0 in | Wt 157.5 lb

## 2023-10-28 DIAGNOSIS — G40909 Epilepsy, unspecified, not intractable, without status epilepticus: Secondary | ICD-10-CM

## 2023-10-28 DIAGNOSIS — Z5181 Encounter for therapeutic drug level monitoring: Secondary | ICD-10-CM

## 2023-10-28 MED ORDER — GABAPENTIN 300 MG PO CAPS: 300.0000 mg | ORAL_CAPSULE | Freq: Every day | ORAL | 3 refills | Status: DC

## 2023-10-28 MED ORDER — ADULT MULTIVITAMIN W/MINERALS CH: 1.0000 | ORAL_TABLET | Freq: Every day | ORAL | 3 refills | Status: DC

## 2023-10-28 MED ORDER — THIAMINE HCL 100 MG PO TABS: 100.0000 mg | ORAL_TABLET | Freq: Every day | ORAL | 3 refills | Status: DC

## 2023-10-28 MED ORDER — FOLIC ACID 1 MG PO TABS: 1.0000 mg | ORAL_TABLET | Freq: Every day | ORAL | 3 refills | Status: DC

## 2023-10-28 MED ORDER — DIVALPROEX SODIUM 500 MG PO DR TAB: 500.0000 mg | DELAYED_RELEASE_TABLET | Freq: Two times a day (BID) | ORAL | 3 refills | Status: DC

## 2023-10-28 NOTE — Progress Notes (Signed)
[facility]  PATIENT: Theresa Rios DOB: 06/27/1966  REQUESTING CLINICIAN: Placey, Chales Abrahams, NP HISTORY FROM: Patient REASON FOR VISIT: Epilepsy   HISTORICAL  CHIEF COMPLAINT:  Chief Complaint  Patient presents with   Room 12    Pt is here Alone. Pt states that her last Seizure was in December 21st,2024. Pt states that she was diagnosed with Seizures 1 year ago. Pt states that she has numbness on her right side from her hand down to her ankle. Pt states that she has trouble with her eyes, and her eyes will water. Pt states that she would like to discuss disability and assistance.     HISTORY OF PRESENT ILLNESS:  This a 58 year old woman past medical history of anxiety, depression, polysubstance abuse, epilepsy who is presenting to establish care for her diagnosis.  She tells me that her seizures started in 2023.  They are described as generalized convulsion.  Her seizures usually occur during period of nonadherence or period where she is using alcohol or drugs.  Her last seizure was reported to be in December 2024 and per chart review she did have multiple ED visit for breakthrough seizures last year, last one being in December 2024.  She tells me since December 2024, she has been compliant with her Depakote 500 mg twice daily, denies any side effect from the medication.  She denies any family history of seizures, does have a history of alcohol and drug abuse and also history of concussion.  She has not tried any other medication other than Depakote.  Handedness: Right   Onset: April 2023  Seizure Type: Generalized convulsion   Current frequency: Last seizure was in December 2024  Any injuries from seizures: Bruises, tongue biting   Seizure risk factors: Polysubstance abuse, concussion  Previous ASMs: Depakote   Currenty ASMs: Depakote 500 mg twice daily   ASMs side effects: Denies   Brain Images: No acute abnormality   Previous EEGs: Beta activity     Hospital course and summary  Theresa Rios is a 58 y.o. female with medical history significant of seizure disorder, chronic cocaine abuse, history of alcohol use, history of depression, history of recurrent seizures noncompliant to medication last hospitalization 45-month ago for the same.  Presented to hospital with recurrent seizures at home including in the CT unit while having CT scan.  Initial labs were notable for sodium 135, potassium 3.3, CO2 19, glucose 150, anion gap 18, hemoglobin 15, white blood cell 15, UDS positive for cocaine.  CT head no acute intracranial pathology, sinusitis.  Chest x-ray mild bibasilar atelectasis.  Patient was then considered for admission to the hospital.   Recurrent Seizures Episodes in setting of possible non compliance with antiseizure medication, and drug abuse (cocaine)/alcohol  During hospitalization patient received Ativan and  Depakote.  Neurology Dr. Russella Dar was consulted who recommended loading dose of seizure medications and monitoring while in the hospital.   CT head scan was negative.  Patient was not taking Depakote at home.  Has not had seizure in the last 24 hours.  Will continue Depakote on discharge.  Prescriptions have been refilled again.  Advised not to drink alcohol, use illicit substances and drive.  Recommended follow-up with outpatient PCP   Polysubstance abuse.   Alcohol use disorder, cocaine abuse, tobacco dependence. Alcohol level less than 10.  Patient states she drinks 1 or 2 beer a day.  No obvious withdrawal symptoms noted during hospitalization..  Will prescribe nicotine patch on discharge  OTHER MEDICAL CONDITIONS: Epilepsy, anxiety/depression   REVIEW OF SYSTEMS: Full 14 system review of systems performed and negative with exception of: As noted in the HPI   ALLERGIES: No Known Allergies  HOME MEDICATIONS: Outpatient Medications Prior to Visit  Medication Sig Dispense Refill   divalproex (DEPAKOTE) 500 MG DR tablet  Take 1 tablet (500 mg total) by mouth every 12 (twelve) hours. 60 tablet 2   cyclobenzaprine (FLEXERIL) 10 MG tablet Take 10 mg by mouth daily. (Patient not taking: Reported on 10/28/2023)     nicotine (NICODERM CQ - DOSED IN MG/24 HOURS) 14 mg/24hr patch Place 1 patch (14 mg total) onto the skin daily. (Patient not taking: Reported on 10/28/2023) 28 patch 0   folic acid (FOLVITE) 1 MG tablet Take 1 tablet (1 mg total) by mouth daily. (Patient not taking: Reported on 10/28/2023) 30 tablet 0   gabapentin (NEURONTIN) 300 MG capsule Take 1 capsule (300 mg total) by mouth at bedtime. (Patient not taking: Reported on 10/28/2023) 30 capsule 0   Multiple Vitamin (MULTIVITAMIN WITH MINERALS) TABS tablet Take 1 tablet by mouth daily. (Patient not taking: Reported on 10/28/2023) 130 tablet 0   thiamine (VITAMIN B1) 100 MG tablet Take 1 tablet (100 mg total) by mouth daily. (Patient not taking: Reported on 10/28/2023) 30 tablet 0   No facility-administered medications prior to visit.    PAST MEDICAL HISTORY: Past Medical History:  Diagnosis Date   Seizures (HCC)    Suicide attempt by drug ingestion (HCC) 03/19/2022    PAST SURGICAL HISTORY: History reviewed. No pertinent surgical history.  FAMILY HISTORY: Family History  Problem Relation Age of Onset   Seizures Neg Hx     SOCIAL HISTORY: Social History   Socioeconomic History   Marital status: Media planner    Spouse name: Not on file   Number of children: 0   Years of education: Not on file   Highest education level: Not on file  Occupational History   Not on file  Tobacco Use   Smoking status: Every Day    Current packs/day: 0.50    Types: Cigarettes   Smokeless tobacco: Never  Vaping Use   Vaping status: Never Used  Substance and Sexual Activity   Alcohol use: Yes    Alcohol/week: 10.0 standard drinks of alcohol    Types: 10 Cans of beer per week    Comment: drinks Sundays- sometimes during the week   Drug use: Not Currently    Sexual activity: Not on file  Other Topics Concern   Not on file  Social History Narrative   Pt lives in [place] with a friend. Her husband is living with his mother. Pt recently lost her job and her place to stay.   Social Drivers of Corporate investment banker Strain: Not on file  Food Insecurity: Food Insecurity Present (11/27/2022)   Hunger Vital Sign    Worried About Running Out of Food in the Last Year: Sometimes true    Ran Out of Food in the Last Year: Sometimes true  Transportation Needs: Unmet Transportation Needs (11/27/2022)   PRAPARE - Administrator, Civil Service (Medical): Yes    Lack of Transportation (Non-Medical): Yes  Physical Activity: Not on file  Stress: Not on file  Social Connections: Not on file  Intimate Partner Violence: Not At Risk (11/27/2022)   Humiliation, Afraid, Rape, and Kick questionnaire    Fear of Current or Ex-Partner: No    Emotionally Abused: No  Physically Abused: No    Sexually Abused: No    PHYSICAL EXAM  GENERAL EXAM/CONSTITUTIONAL: Vitals:  Vitals:   10/28/23 0919  BP: (!) 100/49  Pulse: 98  Weight: 157 lb 8 oz (71.4 kg)  Height: 5\' 2"  (1.575 m)   Body mass index is 28.81 kg/m. Wt Readings from Last 3 Encounters:  10/28/23 157 lb 8 oz (71.4 kg)  05/05/23 167 lb 5.3 oz (75.9 kg)  04/22/23 167 lb 6.4 oz (75.9 kg)   Patient is in no distress; well developed, nourished and groomed; neck is supple  MUSCULOSKELETAL: Gait, strength, tone, movements noted in Neurologic exam below  NEUROLOGIC: MENTAL STATUS:      No data to display         awake, alert, oriented to person, place and time recent and remote memory intact normal attention and concentration language fluent, comprehension intact, naming intact fund of knowledge appropriate  CRANIAL NERVE:  2nd, 3rd, 4th, 6th - Visual fields full to confrontation, extraocular muscles intact, no nystagmus 5th - facial sensation symmetric 7th - facial  strength symmetric 8th - hearing intact 9th - palate elevates symmetrically, uvula midline 11th - shoulder shrug symmetric 12th - tongue protrusion midline  MOTOR:  normal bulk and tone, full strength in the BUE, BLE  SENSORY:  normal and symmetric to light touch  COORDINATION:  finger-nose-finger, fine finger movements normal  GAIT/STATION:  normal   DIAGNOSTIC DATA (LABS, IMAGING, TESTING) - I reviewed patient records, labs, notes, testing and imaging myself where available.  Lab Results  Component Value Date   WBC 12.5 (H) 08/31/2023   HGB 14.2 08/31/2023   HCT 44.3 08/31/2023   MCV 85.7 08/31/2023   PLT 193 08/31/2023      Component Value Date/Time   NA 136 08/31/2023 0452   K 3.1 (L) 08/31/2023 0452   CL 102 08/31/2023 0452   CO2 24 08/31/2023 0452   GLUCOSE 97 08/31/2023 0452   BUN 8 08/31/2023 0452   CREATININE 0.52 08/31/2023 0452   CALCIUM 9.4 08/31/2023 0452   PROT 7.6 08/31/2023 0452   ALBUMIN 3.8 08/31/2023 0452   AST 17 08/31/2023 0452   ALT 15 08/31/2023 0452   ALKPHOS 65 08/31/2023 0452   BILITOT 1.3 (H) 08/31/2023 0452   GFRNONAA >60 08/31/2023 0452   GFRAA >60 10/11/2019 2000   Lab Results  Component Value Date   CHOL 245 (H) 09/01/2022   HDL 89 09/01/2022   LDLCALC 130 (H) 09/01/2022   TRIG 128 09/01/2022   Lab Results  Component Value Date   HGBA1C 5.6 08/03/2022   No results found for: "VITAMINB12" Lab Results  Component Value Date   TSH 0.618 08/01/2022    Head CT 08/30/2023 1. No evidence of an acute intracranial abnormality.   EEG 11/27/2022 - Excessive beta, generalized    ASSESSMENT AND PLAN  58 y.o. year old female  with history of epilepsy, polysubstance abuse, anxiety/depression who is presenting for management of her epilepsy.  She is currently on Depakote 500 mg twice daily, has breakthrough seizures in the setting of Depakote noncompliance.  Plan will be for patient to continue Depakote 500 mg twice daily,  continue her current the medications, will obtain a Depakote level with CMP.  Advised her to contact me if she does have a breakthrough seizure.  Follow-up in 6 months or sooner if worse.   1. Nonintractable epilepsy without status epilepticus, unspecified epilepsy type (HCC)   2. Therapeutic drug monitoring  Patient Instructions  Continue Depakote 500 mg twice daily, will check a Depakote level with CMP, refill given Continue your other medications Follow-up in 6 months or sooner if worse Please contact me if you do have a breakthrough seizure   Per [facility] statutes, patients with seizures are not allowed to drive until they have been seizure-free for six months.  Other recommendations include using caution when using heavy equipment or power tools. Avoid working on ladders or at heights. Take showers instead of baths.  Do not swim alone.  Ensure the water temperature is not too high on the home water heater. Do not go swimming alone. Do not lock yourself in a room alone (i.e. bathroom). When caring for infants or small children, sit down when holding, feeding, or changing them to minimize risk of injury to the child in the event you have a seizure. Maintain good sleep hygiene. Avoid alcohol.  Also recommend adequate sleep, hydration, good diet and minimize stress.   During the Seizure  - First, ensure adequate ventilation and place patients on the floor on their left side  Loosen clothing around the neck and ensure the airway is patent. If the patient is clenching the teeth, do not force the mouth open with any object as this can cause severe damage - Remove all items from the surrounding that can be hazardous. The patient may be oblivious to what's happening and may not even know what he or she is doing. If the patient is confused and wandering, either gently guide him/her away and block access to outside areas - Reassure the individual and be comforting - Call 911. In most  cases, the seizure ends before EMS arrives. However, there are cases when seizures may last over 3 to 5 minutes. Or the individual may have developed breathing difficulties or severe injuries. If a pregnant patient or a person with diabetes develops a seizure, it is prudent to call an ambulance. - Finally, if the patient does not regain full consciousness, then call EMS. Most patients will remain confused for about 45 to 90 minutes after a seizure, so you must use judgment in calling for help. - Avoid restraints but make sure the patient is in a bed with padded side rails - Place the individual in a lateral position with the neck slightly flexed; this will help the saliva drain from the mouth and prevent the tongue from falling backward - Remove all nearby furniture and other hazards from the area - Provide verbal assurance as the individual is regaining consciousness - Provide the patient with privacy if possible - Call for help and start treatment as ordered by the caregiver   After the Seizure (Postictal Stage)  After a seizure, most patients experience confusion, fatigue, muscle pain and/or a headache. Thus, one should permit the individual to sleep. For the next few days, reassurance is essential. Being calm and helping reorient the person is also of importance.  Most seizures are painless and end spontaneously. Seizures are not harmful to others but can lead to complications such as stress on the lungs, brain and the heart. Individuals with prior lung problems may develop labored breathing and respiratory distress.    Discussed Patients with epilepsy have a small risk of sudden unexpected death, a condition referred to as sudden unexpected death in epilepsy (SUDEP). SUDEP is defined specifically as the sudden, unexpected, witnessed or unwitnessed, nontraumatic and nondrowning death in patients with epilepsy with or without evidence for a seizure, and excluding  documented status epilepticus, in  which post mortem examination does not reveal a structural or toxicologic cause for death     Orders Placed This Encounter  Procedures   Valproic Acid Level   CMP    Meds ordered this encounter  Medications   divalproex (DEPAKOTE) 500 MG DR tablet    Sig: Take 1 tablet (500 mg total) by mouth every 12 (twelve) hours.    Dispense:  180 tablet    Refill:  3   folic acid (FOLVITE) 1 MG tablet    Sig: Take 1 tablet (1 mg total) by mouth daily.    Dispense:  90 tablet    Refill:  3   gabapentin (NEURONTIN) 300 MG capsule    Sig: Take 1 capsule (300 mg total) by mouth at bedtime.    Dispense:  90 capsule    Refill:  3   Multiple Vitamin (MULTIVITAMIN WITH MINERALS) TABS tablet    Sig: Take 1 tablet by mouth daily.    Dispense:  90 tablet    Refill:  3   thiamine (VITAMIN B1) 100 MG tablet    Sig: Take 1 tablet (100 mg total) by mouth daily.    Dispense:  90 tablet    Refill:  3    Return in about 6 months (around 04/26/2024).    Windell Norfolk, MD 10/28/2023, 9:54 AM  [facility] [address], [address] [place], [place] [address] [phone]

## 2023-10-28 NOTE — Patient Instructions (Signed)
Continue Depakote 500 mg twice daily, will check a Depakote level with CMP, refill given Continue your other medications Follow-up in 6 months or sooner if worse Please contact me if you do have a breakthrough seizure

## 2023-10-29 ENCOUNTER — Telehealth: Payer: Self-pay

## 2023-10-29 LAB — COMPREHENSIVE METABOLIC PANEL
ALT: 14 [IU]/L (ref 0–32)
AST: 19 [IU]/L (ref 0–40)
Albumin: 4.3 g/dL (ref 3.8–4.9)
Alkaline Phosphatase: 99 [IU]/L (ref 44–121)
BUN/Creatinine Ratio: 8 — ABNORMAL LOW (ref 9–23)
BUN: 5 mg/dL — ABNORMAL LOW (ref 6–24)
Bilirubin Total: <0.2 mg/dL (ref 0.0–1.2)
CO2: 21 mmol/L (ref 20–29)
Calcium: 9.8 mg/dL (ref 8.7–10.2)
Chloride: 101 mmol/L (ref 96–106)
Creatinine, Ser: 0.66 mg/dL (ref 0.57–1.00)
Globulin, Total: 2.8 g/dL (ref 1.5–4.5)
Glucose: 105 mg/dL — ABNORMAL HIGH (ref 70–99)
Potassium: 4 mmol/L (ref 3.5–5.2)
Sodium: 143 mmol/L (ref 134–144)
Total Protein: 7.1 g/dL (ref 6.0–8.5)
eGFR: 102 mL/min/{1.73_m2} (ref >59)

## 2023-10-29 LAB — VALPROIC ACID LEVEL: Valproic Acid Lvl: 47 ug/mL — ABNORMAL LOW (ref 50–100)

## 2023-10-29 NOTE — Progress Notes (Signed)
 Please call and advise the patient that the recent labs we checked were within normal limits. No further action is required on these tests at this time. Please remind patient to keep any upcoming appointments or tests and to call us with any interim questions, concerns, problems or updates. Thanks,   Windell Norfolk, MD

## 2023-10-29 NOTE — Telephone Encounter (Addendum)
Called pt and let her know her lab results per Dr. Teresa Coombs  ----- Message from Windell Norfolk sent at 10/29/2023  9:57 AM EST ----- Please call and advise the patient that the recent labs we checked were within normal limits. No further action is required on these tests at this time. Please remind patient to keep any upcoming appointments or tests and to call us with any interim questions, concerns, problems or updates. Thanks,   Windell Norfolk, MD

## 2023-11-21 ENCOUNTER — Encounter: Payer: Self-pay | Admitting: Nurse Practitioner

## 2023-12-03 ENCOUNTER — Encounter (HOSPITAL_COMMUNITY): Payer: Self-pay

## 2023-12-03 ENCOUNTER — Other Ambulatory Visit: Payer: Self-pay

## 2023-12-03 ENCOUNTER — Emergency Department (HOSPITAL_COMMUNITY)
Admission: EM | Admit: 2023-12-03 | Discharge: 2023-12-04 | Disposition: A | Discharge status: Home / Self Care | Payer: MEDICAID | Attending: Emergency Medicine | Admitting: Emergency Medicine

## 2023-12-03 DIAGNOSIS — R569 Unspecified convulsions: Secondary | ICD-10-CM | POA: Diagnosis present

## 2023-12-03 DIAGNOSIS — Z91148 Patient's other noncompliance with medication regimen for other reason: Secondary | ICD-10-CM | POA: Diagnosis not present

## 2023-12-03 LAB — CBG MONITORING, ED: Glucose-Capillary: 143 mg/dL — ABNORMAL HIGH (ref 70–99)

## 2023-12-03 MED ORDER — LORAZEPAM 2 MG/ML IJ SOLN
1.0000 mg | Freq: Once | INTRAMUSCULAR | Status: DC
  Filled 2023-12-03: qty 1

## 2023-12-03 MED ORDER — VALPROATE SODIUM 100 MG/ML IV SOLN
500.0000 mg | Freq: Once | INTRAVENOUS | Status: AC
  Administered 2023-12-04: 500 mg via INTRAVENOUS
  Filled 2023-12-03: qty 5

## 2023-12-03 MED ORDER — LORAZEPAM 2 MG/ML IJ SOLN
2.0000 mg | Freq: Once | INTRAMUSCULAR | Status: AC
  Administered 2023-12-03: 2 mg via INTRAVENOUS
  Filled 2023-12-03: qty 1

## 2023-12-03 NOTE — ED Provider Notes (Signed)
  [place] EMERGENCY DEPARTMENT AT [facility] Provider Note   CSN: [id number] Arrival date & time: 12/03/23  2317     History {Add pertinent medical, surgical, social history, OB history to HPI:1} Chief Complaint  Patient presents with   Seizures    Theresa Rios is a 58 y.o. female.   Seizures      Home Medications Prior to Admission medications   Medication Sig Start Date End Date Taking? Authorizing Provider  cyclobenzaprine (FLEXERIL) 10 MG tablet Take 10 mg by mouth daily. Patient not taking: Reported on 10/28/2023    [provider]  divalproex (DEPAKOTE) 500 MG DR tablet Take 1 tablet (500 mg total) by mouth every 12 (twelve) hours. 10/28/23 10/22/24  Windell Norfolk, MD  folic acid (FOLVITE) 1 MG tablet Take 1 tablet (1 mg total) by mouth daily. 10/28/23 10/22/24  Windell Norfolk, MD  gabapentin (NEURONTIN) 300 MG capsule Take 1 capsule (300 mg total) by mouth at bedtime. 10/28/23 10/22/24  Windell Norfolk, MD  Multiple Vitamin (MULTIVITAMIN WITH MINERALS) TABS tablet Take 1 tablet by mouth daily. 10/28/23 10/22/24  Windell Norfolk, MD  nicotine (NICODERM CQ - DOSED IN MG/24 HOURS) 14 mg/24hr patch Place 1 patch (14 mg total) onto the skin daily. Patient not taking: Reported on 10/28/2023 09/01/23   Joycelyn Das, MD  thiamine (VITAMIN B1) 100 MG tablet Take 1 tablet (100 mg total) by mouth daily. 10/28/23 10/22/24  Windell Norfolk, MD      Allergies    Patient has no known allergies.    Review of Systems   Review of Systems  Neurological:  Positive for seizures.    Physical Exam Updated Vital Signs LMP 05/17/2014  Physical Exam  ED Results / Procedures / Treatments   Labs (all labs ordered are listed, but only abnormal results are displayed) Labs Reviewed  COMPREHENSIVE METABOLIC PANEL  CBC WITH DIFFERENTIAL/PLATELET  MAGNESIUM  URINALYSIS, ROUTINE W REFLEX MICROSCOPIC  VALPROIC ACID LEVEL    EKG None  Radiology No results  found.  Procedures Procedures  {Document cardiac monitor, telemetry assessment procedure when appropriate:1}  Medications Ordered in ED Medications - No data to display  ED Course/ Medical Decision Making/ A&P   {   Click here for ABCD2, HEART and other calculatorsREFRESH Note before signing :1}                              Medical Decision Making Amount and/or Complexity of Data Reviewed Labs: ordered.   ***  {Document critical care time when appropriate:1} {Document review of labs and clinical decision tools ie heart score, Chads2Vasc2 etc:1}  {Document your independent review of radiology images, and any outside records:1} {Document your discussion with family members, caretakers, and with consultants:1} {Document social determinants of health affecting pt's care:1} {Document your decision making why or why not admission, treatments were needed:1} Final Clinical Impression(s) / ED Diagnoses Final diagnoses:  None    Rx / DC Orders ED Discharge Orders     None

## 2023-12-03 NOTE — ED Triage Notes (Signed)
 Patient BIB [facility] from home due to seizures. Per EMS boyfriend reports 3 seizures today. 3rd seizure is when EMS was called. Patient has hx of seizures but has not taken seizure medication for 2 weeks per EMS. Patient is A&O upon arrival.

## 2023-12-03 NOTE — ED Notes (Signed)
 Patient had witnessed seizure lasting roughly 2-3 minutes. Patient turned on side, mouth suctioned, provider notified.

## 2023-12-04 LAB — COMPREHENSIVE METABOLIC PANEL WITH GFR
ALT: 17 U/L (ref 0–44)
AST: 26 U/L (ref 15–41)
Albumin: 3.3 g/dL — ABNORMAL LOW (ref 3.5–5.0)
Alkaline Phosphatase: 67 U/L (ref 38–126)
Anion gap: 9 (ref 5–15)
BUN: 5 mg/dL — ABNORMAL LOW (ref 6–20)
CO2: 24 mmol/L (ref 22–32)
Calcium: 9.1 mg/dL (ref 8.9–10.3)
Chloride: 108 mmol/L (ref 98–111)
Creatinine, Ser: 0.74 mg/dL (ref 0.44–1.00)
GFR, Estimated: >60 mL/min (ref >60)
Glucose, Bld: 118 mg/dL — ABNORMAL HIGH (ref 70–99)
Potassium: 4.2 mmol/L (ref 3.5–5.1)
Sodium: 141 mmol/L (ref 135–145)
Total Bilirubin: 0.9 mg/dL (ref 0.0–1.2)
Total Protein: 6.4 g/dL — ABNORMAL LOW (ref 6.5–8.1)

## 2023-12-04 LAB — URINALYSIS, ROUTINE W REFLEX MICROSCOPIC
Bilirubin Urine: NEGATIVE
Glucose, UA: NEGATIVE mg/dL
Hgb urine dipstick: NEGATIVE
Ketones, ur: NEGATIVE mg/dL
Leukocytes,Ua: NEGATIVE
Nitrite: NEGATIVE
Protein, ur: NEGATIVE mg/dL
Specific Gravity, Urine: 1.011 (ref 1.005–1.030)
pH: 6 (ref 5.0–8.0)

## 2023-12-04 LAB — VALPROIC ACID LEVEL: Valproic Acid Lvl: <10 ug/mL — ABNORMAL LOW (ref 50.0–100.0)

## 2023-12-04 LAB — CBC WITH DIFFERENTIAL/PLATELET
Abs Immature Granulocytes: 0.03 10*3/uL (ref 0.00–0.07)
Basophils Absolute: 0 10*3/uL (ref 0.0–0.1)
Basophils Relative: 0 %
Eosinophils Absolute: 0.1 10*3/uL (ref 0.0–0.5)
Eosinophils Relative: 1 %
HCT: 46 % (ref 36.0–46.0)
Hemoglobin: 14.7 g/dL (ref 12.0–15.0)
Immature Granulocytes: 0 %
Lymphocytes Relative: 19 %
Lymphs Abs: 1.9 10*3/uL (ref 0.7–4.0)
MCH: 28.5 pg (ref 26.0–34.0)
MCHC: 32 g/dL (ref 30.0–36.0)
MCV: 89.3 fL (ref 80.0–100.0)
Monocytes Absolute: 0.8 10*3/uL (ref 0.1–1.0)
Monocytes Relative: 8 %
Neutro Abs: 7.3 10*3/uL (ref 1.7–7.7)
Neutrophils Relative %: 72 %
Platelets: 210 10*3/uL (ref 150–400)
RBC: 5.15 MIL/uL — ABNORMAL HIGH (ref 3.87–5.11)
RDW: 14.6 % (ref 11.5–15.5)
WBC: 10.1 10*3/uL (ref 4.0–10.5)
nRBC: 0 % (ref 0.0–0.2)

## 2023-12-04 LAB — MAGNESIUM: Magnesium: 2.1 mg/dL (ref 1.7–2.4)

## 2023-12-04 MED ORDER — VALPROATE SODIUM 100 MG/ML IV SOLN
1000.0000 mg | Freq: Once | INTRAVENOUS | Status: AC
  Administered 2023-12-04: 1000 mg via INTRAVENOUS
  Filled 2023-12-04: qty 10

## 2023-12-04 NOTE — ED Notes (Signed)
 Patient ambulated with no issues or complaints. Steady on feet.

## 2023-12-04 NOTE — ED Notes (Signed)
 Notified RN of pt o2 stat

## 2023-12-24 ENCOUNTER — Ambulatory Visit: Payer: MEDICAID | Admitting: Neurology

## 2024-01-06 NOTE — Congregational Nurse Program (Signed)
 Client to RN office. She is need of case management services and with help getting disability application, getting a PCP, getting case management with [employer] case management services and wanting access for a phone. RN schedule full today but will assist client with need on April 30th at 1000. RN will continue to follow.

## 2024-01-07 NOTE — Congregational Nurse Program (Signed)
 Client to RN office for scheduled appointment. She is in need of help and has been battling addiction for many years. She states she wants to get clean in get into rehab. She states uses crack and drinks alcohol excessively. Last time using was 01/06/24. She is waiting on a phone to be delivered today and to get clothes together for rehab and will send her to [facility] tomorrow for detox from substance abuse and alcohol.  She is requesting to get into [facility] after detox and we are hoping she can get in from [facility] into [place] facility.  We also arranged a PCP appointment: [facility] on May 14th, 2025 at 2:10 pm [facility]. RN was also able to fit her reading glasses at 2.25. No further needs at this time and RN will arrange transport for client.

## 2024-01-08 ENCOUNTER — Other Ambulatory Visit (HOSPITAL_COMMUNITY)
Admission: EM | Admit: 2024-01-08 | Discharge: 2024-01-15 | Disposition: A | Discharge status: Home / Self Care | Payer: MEDICAID | Attending: Psychiatry | Admitting: Psychiatry

## 2024-01-08 ENCOUNTER — Ambulatory Visit (HOSPITAL_COMMUNITY): Payer: MEDICAID | Admitting: Mental Health

## 2024-01-08 ENCOUNTER — Ambulatory Visit (HOSPITAL_COMMUNITY)
Admission: EM | Admit: 2024-01-08 | Discharge: 2024-01-08 | Disposition: A | Discharge status: Other Acute Inpatient Hospital | Payer: MEDICAID | Attending: Psychiatry | Admitting: Psychiatry

## 2024-01-08 ENCOUNTER — Other Ambulatory Visit: Payer: Self-pay

## 2024-01-08 DIAGNOSIS — F1020 Alcohol dependence, uncomplicated: Secondary | ICD-10-CM | POA: Diagnosis not present

## 2024-01-08 DIAGNOSIS — Z5900 Homelessness unspecified: Secondary | ICD-10-CM | POA: Diagnosis not present

## 2024-01-08 DIAGNOSIS — G4700 Insomnia, unspecified: Secondary | ICD-10-CM | POA: Diagnosis present

## 2024-01-08 DIAGNOSIS — F1090 Alcohol use, unspecified, uncomplicated: Secondary | ICD-10-CM

## 2024-01-08 DIAGNOSIS — F1010 Alcohol abuse, uncomplicated: Secondary | ICD-10-CM | POA: Insufficient documentation

## 2024-01-08 DIAGNOSIS — F1420 Cocaine dependence, uncomplicated: Secondary | ICD-10-CM | POA: Diagnosis not present

## 2024-01-08 DIAGNOSIS — R7989 Other specified abnormal findings of blood chemistry: Secondary | ICD-10-CM | POA: Diagnosis present

## 2024-01-08 DIAGNOSIS — F17200 Nicotine dependence, unspecified, uncomplicated: Secondary | ICD-10-CM | POA: Diagnosis present

## 2024-01-08 DIAGNOSIS — R4701 Aphasia: Secondary | ICD-10-CM | POA: Diagnosis present

## 2024-01-08 DIAGNOSIS — F411 Generalized anxiety disorder: Secondary | ICD-10-CM | POA: Diagnosis present

## 2024-01-08 DIAGNOSIS — G40919 Epilepsy, unspecified, intractable, without status epilepticus: Secondary | ICD-10-CM | POA: Diagnosis not present

## 2024-01-08 DIAGNOSIS — F322 Major depressive disorder, single episode, severe without psychotic features: Secondary | ICD-10-CM | POA: Diagnosis present

## 2024-01-08 DIAGNOSIS — K7682 Hepatic encephalopathy: Secondary | ICD-10-CM | POA: Diagnosis present

## 2024-01-08 DIAGNOSIS — F1410 Cocaine abuse, uncomplicated: Secondary | ICD-10-CM | POA: Diagnosis present

## 2024-01-08 DIAGNOSIS — F17290 Nicotine dependence, other tobacco product, uncomplicated: Secondary | ICD-10-CM | POA: Insufficient documentation

## 2024-01-08 DIAGNOSIS — F332 Major depressive disorder, recurrent severe without psychotic features: Secondary | ICD-10-CM | POA: Diagnosis present

## 2024-01-08 DIAGNOSIS — R9431 Abnormal electrocardiogram [ECG] [EKG]: Secondary | ICD-10-CM | POA: Diagnosis present

## 2024-01-08 DIAGNOSIS — E559 Vitamin D deficiency, unspecified: Secondary | ICD-10-CM | POA: Diagnosis not present

## 2024-01-08 DIAGNOSIS — F331 Major depressive disorder, recurrent, moderate: Secondary | ICD-10-CM | POA: Insufficient documentation

## 2024-01-08 DIAGNOSIS — F333 Major depressive disorder, recurrent, severe with psychotic symptoms: Secondary | ICD-10-CM | POA: Diagnosis present

## 2024-01-08 DIAGNOSIS — E7220 Disorder of urea cycle metabolism, unspecified: Secondary | ICD-10-CM | POA: Insufficient documentation

## 2024-01-08 DIAGNOSIS — F1290 Cannabis use, unspecified, uncomplicated: Secondary | ICD-10-CM | POA: Diagnosis present

## 2024-01-08 LAB — ETHANOL: Alcohol, Ethyl (B): <15 mg/dL (ref <15)

## 2024-01-08 LAB — CBC WITH DIFFERENTIAL/PLATELET
Abs Immature Granulocytes: 0.03 10*3/uL (ref 0.00–0.07)
Basophils Absolute: 0 10*3/uL (ref 0.0–0.1)
Basophils Relative: 0 %
Eosinophils Absolute: 0.1 10*3/uL (ref 0.0–0.5)
Eosinophils Relative: 1 %
HCT: 44.6 % (ref 36.0–46.0)
Hemoglobin: 14.4 g/dL (ref 12.0–15.0)
Immature Granulocytes: 0 %
Lymphocytes Relative: 32 %
Lymphs Abs: 3.2 10*3/uL (ref 0.7–4.0)
MCH: 29 pg (ref 26.0–34.0)
MCHC: 32.3 g/dL (ref 30.0–36.0)
MCV: 89.7 fL (ref 80.0–100.0)
Monocytes Absolute: 1.4 10*3/uL — ABNORMAL HIGH (ref 0.1–1.0)
Monocytes Relative: 14 %
Neutro Abs: 5.3 10*3/uL (ref 1.7–7.7)
Neutrophils Relative %: 53 %
Platelets: 262 10*3/uL (ref 150–400)
RBC: 4.97 MIL/uL (ref 3.87–5.11)
RDW: 14.3 % (ref 11.5–15.5)
WBC: 10.1 10*3/uL (ref 4.0–10.5)
nRBC: 0 % (ref 0.0–0.2)

## 2024-01-08 LAB — COMPREHENSIVE METABOLIC PANEL WITH GFR
ALT: 22 U/L (ref 0–44)
AST: 25 U/L (ref 15–41)
Albumin: 3.6 g/dL (ref 3.5–5.0)
Alkaline Phosphatase: 57 U/L (ref 38–126)
Anion gap: 11 (ref 5–15)
BUN: 9 mg/dL (ref 6–20)
CO2: 27 mmol/L (ref 22–32)
Calcium: 9.6 mg/dL (ref 8.9–10.3)
Chloride: 101 mmol/L (ref 98–111)
Creatinine, Ser: 0.71 mg/dL (ref 0.44–1.00)
GFR, Estimated: >60 mL/min (ref >60)
Glucose, Bld: 78 mg/dL (ref 70–99)
Potassium: 4.7 mmol/L (ref 3.5–5.1)
Sodium: 139 mmol/L (ref 135–145)
Total Bilirubin: 0.6 mg/dL (ref 0.0–1.2)
Total Protein: 6.7 g/dL (ref 6.5–8.1)

## 2024-01-08 LAB — POCT URINE DRUG SCREEN - MANUAL ENTRY (I-SCREEN)
POC Amphetamine UR: NOT DETECTED
POC Buprenorphine (BUP): NOT DETECTED
POC Cocaine UR: POSITIVE — AB
POC Marijuana UR: POSITIVE — AB
POC Methadone UR: NOT DETECTED
POC Methamphetamine UR: NOT DETECTED
POC Morphine: NOT DETECTED
POC Oxazepam (BZO): NOT DETECTED
POC Oxycodone UR: NOT DETECTED
POC Secobarbital (BAR): NOT DETECTED

## 2024-01-08 LAB — HEMOGLOBIN A1C
Hgb A1c MFr Bld: 5.5 % (ref 4.8–5.6)
Mean Plasma Glucose: 111.15 mg/dL

## 2024-01-08 LAB — LIPID PANEL
Cholesterol: 214 mg/dL — ABNORMAL HIGH (ref 0–200)
HDL: 99 mg/dL (ref >40)
LDL Cholesterol: 91 mg/dL (ref 0–99)
Total CHOL/HDL Ratio: 2.2 ratio
Triglycerides: 120 mg/dL (ref <150)
VLDL: 24 mg/dL (ref 0–40)

## 2024-01-08 LAB — TSH: TSH: 1.077 u[IU]/mL (ref 0.350–4.500)

## 2024-01-08 LAB — MAGNESIUM: Magnesium: 2.4 mg/dL (ref 1.7–2.4)

## 2024-01-08 LAB — VALPROIC ACID LEVEL: Valproic Acid Lvl: 35 ug/mL — ABNORMAL LOW (ref 50–100)

## 2024-01-08 MED ORDER — LORAZEPAM 1 MG PO TABS: 1.0000 mg | ORAL_TABLET | Freq: Two times a day (BID) | ORAL | Status: DC

## 2024-01-08 MED ORDER — THIAMINE HCL 100 MG/ML IJ SOLN: 100.0000 mg | Freq: Once | INTRAMUSCULAR | Status: DC

## 2024-01-08 MED ORDER — LORAZEPAM 1 MG PO TABS: 1.0000 mg | ORAL_TABLET | Freq: Every day | ORAL | Status: DC

## 2024-01-08 MED ORDER — THIAMINE MONONITRATE 100 MG PO TABS: 100.0000 mg | ORAL_TABLET | Freq: Every day | ORAL | Status: DC

## 2024-01-08 MED ORDER — HALOPERIDOL LACTATE 5 MG/ML IJ SOLN: 10.0000 mg | Freq: Three times a day (TID) | INTRAMUSCULAR | Status: DC | PRN

## 2024-01-08 MED ORDER — LORAZEPAM 2 MG/ML IJ SOLN: 2.0000 mg | Freq: Three times a day (TID) | INTRAMUSCULAR | Status: DC | PRN

## 2024-01-08 MED ORDER — DIPHENHYDRAMINE HCL 50 MG PO CAPS: 50.0000 mg | ORAL_CAPSULE | Freq: Three times a day (TID) | ORAL | Status: DC | PRN

## 2024-01-08 MED ORDER — HYDROXYZINE HCL 25 MG PO TABS: 25.0000 mg | ORAL_TABLET | Freq: Four times a day (QID) | ORAL | Status: DC | PRN

## 2024-01-08 MED ORDER — DIPHENHYDRAMINE HCL 50 MG/ML IJ SOLN: 50.0000 mg | Freq: Three times a day (TID) | INTRAMUSCULAR | Status: DC | PRN

## 2024-01-08 MED ORDER — ONDANSETRON 4 MG PO TBDP: 4.0000 mg | ORAL_TABLET | Freq: Four times a day (QID) | ORAL | Status: DC | PRN

## 2024-01-08 MED ORDER — THIAMINE MONONITRATE 100 MG PO TABS
100.0000 mg | ORAL_TABLET | Freq: Every day | ORAL | Status: DC
  Administered 2024-01-09 – 2024-01-10 (×2): 100 mg via ORAL
  Filled 2024-01-08 (×2): qty 1

## 2024-01-08 MED ORDER — LOPERAMIDE HCL 2 MG PO CAPS: 2.0000 mg | ORAL_CAPSULE | ORAL | Status: DC | PRN

## 2024-01-08 MED ORDER — ADULT MULTIVITAMIN W/MINERALS CH
1.0000 | ORAL_TABLET | Freq: Every day | ORAL | Status: DC
  Administered 2024-01-08 – 2024-01-14 (×7): 1 via ORAL
  Filled 2024-01-08: qty 7
  Filled 2024-01-08 (×7): qty 1

## 2024-01-08 MED ORDER — HALOPERIDOL 5 MG PO TABS: 5.0000 mg | ORAL_TABLET | Freq: Three times a day (TID) | ORAL | Status: DC | PRN

## 2024-01-08 MED ORDER — LORAZEPAM 1 MG PO TABS
1.0000 mg | ORAL_TABLET | Freq: Four times a day (QID) | ORAL | Status: DC
  Administered 2024-01-08 – 2024-01-09 (×4): 1 mg via ORAL
  Filled 2024-01-08 (×4): qty 1

## 2024-01-08 MED ORDER — ONDANSETRON 4 MG PO TBDP: 4.0000 mg | ORAL_TABLET | Freq: Four times a day (QID) | ORAL | Status: AC | PRN

## 2024-01-08 MED ORDER — NICOTINE 21 MG/24HR TD PT24
21.0000 mg | MEDICATED_PATCH | Freq: Every day | TRANSDERMAL | Status: DC
Start: 2024-01-08 — End: 2024-01-08

## 2024-01-08 MED ORDER — LORAZEPAM 1 MG PO TABS: 1.0000 mg | ORAL_TABLET | Freq: Four times a day (QID) | ORAL | Status: DC

## 2024-01-08 MED ORDER — LORAZEPAM 1 MG PO TABS: 1.0000 mg | ORAL_TABLET | Freq: Three times a day (TID) | ORAL | Status: DC

## 2024-01-08 MED ORDER — DIVALPROEX SODIUM 500 MG PO DR TAB: 500.0000 mg | DELAYED_RELEASE_TABLET | Freq: Two times a day (BID) | ORAL | Status: DC

## 2024-01-08 MED ORDER — LOPERAMIDE HCL 2 MG PO CAPS
2.0000 mg | ORAL_CAPSULE | ORAL | Status: AC | PRN
Start: 2024-01-08 — End: 2024-01-11

## 2024-01-08 MED ORDER — ACETAMINOPHEN 325 MG PO TABS: 650.0000 mg | ORAL_TABLET | Freq: Four times a day (QID) | ORAL | Status: DC | PRN

## 2024-01-08 MED ORDER — GABAPENTIN 300 MG PO CAPS: 300.0000 mg | ORAL_CAPSULE | Freq: Every day | ORAL | Status: DC

## 2024-01-08 MED ORDER — THIAMINE HCL 100 MG/ML IJ SOLN
100.0000 mg | Freq: Once | INTRAMUSCULAR | Status: DC
Start: 2024-01-08 — End: 2024-01-15

## 2024-01-08 MED ORDER — ALUM & MAG HYDROXIDE-SIMETH 200-200-20 MG/5ML PO SUSP: 30.0000 mL | ORAL | Status: DC | PRN

## 2024-01-08 MED ORDER — HALOPERIDOL LACTATE 5 MG/ML IJ SOLN: 5.0000 mg | Freq: Three times a day (TID) | INTRAMUSCULAR | Status: DC | PRN

## 2024-01-08 MED ORDER — ADULT MULTIVITAMIN W/MINERALS CH: 1.0000 | ORAL_TABLET | Freq: Every day | ORAL | Status: DC

## 2024-01-08 MED ORDER — SERTRALINE HCL 25 MG PO TABS: 25.0000 mg | ORAL_TABLET | Freq: Every day | ORAL | Status: DC

## 2024-01-08 MED ORDER — FOLIC ACID 1 MG PO TABS
1.0000 mg | ORAL_TABLET | Freq: Every day | ORAL | Status: DC
  Administered 2024-01-08 – 2024-01-14 (×7): 1 mg via ORAL
  Filled 2024-01-08 (×7): qty 1
  Filled 2024-01-08: qty 7

## 2024-01-08 MED ORDER — GABAPENTIN 300 MG PO CAPS
300.0000 mg | ORAL_CAPSULE | Freq: Every day | ORAL | Status: DC
  Administered 2024-01-08: 300 mg via ORAL
  Filled 2024-01-08: qty 1

## 2024-01-08 MED ORDER — TRAZODONE HCL 50 MG PO TABS
50.0000 mg | ORAL_TABLET | Freq: Every evening | ORAL | Status: DC | PRN
  Administered 2024-01-08: 50 mg via ORAL
  Filled 2024-01-08: qty 1

## 2024-01-08 MED ORDER — HYDROXYZINE HCL 25 MG PO TABS
25.0000 mg | ORAL_TABLET | Freq: Four times a day (QID) | ORAL | Status: AC | PRN
  Administered 2024-01-08: 25 mg via ORAL
  Filled 2024-01-08: qty 1

## 2024-01-08 MED ORDER — TRAZODONE HCL 50 MG PO TABS: 50.0000 mg | ORAL_TABLET | Freq: Every evening | ORAL | Status: DC | PRN

## 2024-01-08 MED ORDER — ACETAMINOPHEN 325 MG PO TABS
650.0000 mg | ORAL_TABLET | Freq: Four times a day (QID) | ORAL | Status: DC | PRN
  Administered 2024-01-09 – 2024-01-14 (×6): 650 mg via ORAL
  Filled 2024-01-08 (×7): qty 2

## 2024-01-08 MED ORDER — MAGNESIUM HYDROXIDE 400 MG/5ML PO SUSP: 30.0000 mL | Freq: Every day | ORAL | Status: DC | PRN

## 2024-01-08 MED ORDER — DIVALPROEX SODIUM 500 MG PO DR TAB
500.0000 mg | DELAYED_RELEASE_TABLET | Freq: Two times a day (BID) | ORAL | Status: DC
  Administered 2024-01-08: 500 mg via ORAL
  Filled 2024-01-08: qty 1

## 2024-01-08 MED ORDER — ALUM & MAG HYDROXIDE-SIMETH 200-200-20 MG/5ML PO SUSP
30.0000 mL | ORAL | Status: DC | PRN
  Administered 2024-01-11 – 2024-01-14 (×5): 30 mL via ORAL
  Filled 2024-01-08 (×5): qty 30

## 2024-01-08 MED ORDER — FOLIC ACID 1 MG PO TABS: 1.0000 mg | ORAL_TABLET | Freq: Every day | ORAL | Status: DC

## 2024-01-08 NOTE — ED Notes (Signed)
 Patient is in the bedroom calm and sleeping.  NAD. Respirations even and unlabored. Will monitor for safety.

## 2024-01-08 NOTE — Progress Notes (Signed)
   01/08/24 1039  [facility] Triage Screening (Walk-ins at [facility] only)  How Did You Hear About Us ? Self  What Is the Reason for Your Visit/Call Today? Theresa Rios presents to [facility] voluntarily unaccompanied. Pt states that she needs to detox from drinking and cocaine. Pt states that she drank a lot of beer and liquor on yesterday and that she drank so much she fell down. Pt is very tearful as she shares that she needs to detox from alcohol and cocaine. Pt currently denies SI, HI, AVH and drug use.  How Long Has This Been Causing You Problems? > than 6 months  Have You Recently Had Any Thoughts About Hurting Yourself? No  Are You Planning to Commit Suicide/Harm Yourself At This time? No  Have you Recently Had Thoughts About Hurting Someone Theresa Rios? No  Are You Planning To Harm Someone At This Time? No  Physical Abuse Denies  Verbal Abuse Denies  Sexual Abuse Denies  Exploitation of patient/patient's resources Denies  Self-Neglect Denies  Are you currently experiencing any auditory, visual or other hallucinations? No  Have You Used Any Alcohol or Drugs in the Past 24 Hours? Yes  What Did You Use and How Much? yesterday - drinking (beer & liquor a lot of it, that she fell dow)  Do you have any current medical co-morbidities that require immediate attention? Yes  Please describe current medical co-morbidities that require immediate attention: Hx of seizures  Clinician description of patient physical appearance/behavior: Tearful, sad, cooperative  What Do You Feel Would Help You the Most Today? Alcohol or Drug Use Treatment  If access to [facility] was not available, would you have sought care in the Emergency Department? No  Determination of Need Routine (7 days)  Options For Referral Chemical Dependency Intensive Outpatient Therapy (CDIOP);Facility-Based Crisis

## 2024-01-08 NOTE — Discharge Instructions (Signed)
Pt transferred to [facility]. 

## 2024-01-08 NOTE — ED Notes (Signed)
 Pt sleeping in no acute distress. RR even and unlabored. Environment secured. Will continue to monitor for safety.

## 2024-01-08 NOTE — ED Notes (Addendum)
 Pt admitted to [facility] requesting detox from ETOH. Pt states, "I'm almost 58 yo and I can't keep drinking like this. I drink for bkft, lunch and dinner". Pt reports last drink last night. Pt states, "I drank almost all night until I passed out(laughing)".  Pt denies SI/HI/AVH. Writer informed pt of consequences to liver if she don't stop. Pt verbalized understanding. Pt states, "I'm thru this time and I mean it. I wish I had some kids. That would really motivate me". Calm, cooperative throughout interview process. Skin assessment completed. Oriented to unit. Meal and drink offered. At currrent, pt sitting in cafeteria drinking coffee. Pt verbally contract for safety. Will monitor for safety.

## 2024-01-08 NOTE — Congregational Nurse Program (Signed)
 Client to RN office. She states she is ready for detox services today. She is hopeful to go to [facility] after detoxing. RN will arrange car to transport her to [facility] for detox treatment.

## 2024-01-08 NOTE — ED Provider Notes (Addendum)
 Behavioral Health Urgent Care Medical Screening Exam  Patient Name: Theresa Rios MRN: [id number] Date of Evaluation: 01/08/24 Chief Complaint:  "I want to get off this alcohol and cocaine" Diagnosis:  Final diagnoses:  Moderate episode of recurrent major depressive disorder (HCC)  Alcohol use disorder  Cocaine use disorder (HCC)    History of Present illness: Theresa Rios 58 y.o., female patient presented to [facility] as a voluntary walk in unaccompanied with complaints of depression and wanting to detox from alcohol and cocaine.  Patient has a past psychiatric history of major depressive disorder, anxiety, alcohol use disorder and cocaine use.  She is not currently receiving psychiatric outpatient treatment.  Medical history is pertinent for seizure disorder.  Patient is currently prescribed Depakote  500 mg twice daily and gabapentin  300 mg nightly by her PCP.  Patient reports last seizure was about 2 months ago because she had not taken her seizure medication.  Her last inpatient hospitalization was at [facility] in 2023 for suicidal ideations. Margean Sheehan, is seen face to face by this provider, consulted with Dr. Genita Keys; and chart reviewed on 01/08/24.  On evaluation Theresa Rios reports " I have been drinking alcohol every day for the past few years and I also use cocaine a few times a week and I really just want to stop.  I feel like I need some residential treatment and discussed a place called [facility] when I was upstairs but my caseworker wanted me to come here first to get assessed.  I drink beer and liquor until a pretty much pass out and fall asleep.  I honestly could not tell you what withdrawal symptoms I have because it has been so long since I have been sober for a period of time."  Patient reports that she does not have any support system outside of her disability caseworker who is working with her to receive disability income.  She reports being homeless currently but has been living in a  shelter.  She denies current suicidal ideations and denies history of suicide attempts.  During assessment patient is very tearful and reports ongoing depressive symptoms including hopelessness, sadness, crying spells, irritability, decreased sleep, and isolation.  Patient feels that she has been using alcohol to cover up or treat her depression but wants to stop.  Discussed recommendation to admit her to the [facility] unit for detox and medication management for depression.  We discussed previous medication trials.  Plan to restart Sertraline  25 mg for mood since this was previously tolerated and effective for depression.   Name of Substance: Alcohol Amount using: Varies- " I drink until I pass out" Frequency of use: Daily How long using: " Years" Last used: Yesterday Withdrawal symptoms: Unsure but possibly seizures Longest period of sobriety: Patient unable to tell me last time she had a period of sobriety  Name of Substance: Cocaine Amount using: "I am not sure" Frequency of use: a few times a week How long using: Years  Last used: 3 days ago  During evaluation Theresa Rios is sitting up in chair in emotional distress-patient very tearful and anxious.  She is alert & oriented x 4, cooperative and attentive for this assessment.  Her mood is anxious and depressed with congruent tearful affect.  She has normal speech, and behavior.  Objectively there is no evidence of psychosis/mania or delusional thinking. Pt does not appear to be responding to internal or external stimuli.  Patient is able to converse coherently, goal directed  thoughts, no distractibility, or pre-occupation.  She also denies current suicidal/self-harm/homicidal ideation, psychosis, and paranoia.  Patient answered assessment questions appropriately.    Flowsheet Row ED from 01/08/2024 in [facility] ED from 12/03/2023 in [facility] Emergency Department at [facility] ED to Hosp-Admission  (Discharged) from 08/30/2023 in [place] LONG 4TH FLOOR PROGRESSIVE CARE AND UROLOGY  C-SSRS RISK CATEGORY No Risk No Risk No Risk       Psychiatric Specialty Exam  Presentation  General Appearance:Casual  Eye Contact:Fair  Speech:Clear and Coherent  Speech Volume:Decreased  Handedness:No data recorded  Mood and Affect  Mood: Depressed; Anxious; Hopeless  Affect: Congruent; Depressed; Tearful   Thought Process  Thought Processes: Coherent  Descriptions of Associations:Intact  Orientation:Full (Time, Place and Person)  Thought Content:WDL  Diagnosis of Schizophrenia or Schizoaffective disorder in past: No data recorded  Hallucinations:None  Ideas of Reference:None  Suicidal Thoughts:No  Homicidal Thoughts:No   Sensorium  Memory: Recent Fair; Immediate Good  Judgment: Fair  Insight: Fair   Chartered certified accountant: Fair  Attention Span: Fair  Recall: [employer] of Knowledge: Fair  Language: Fair   Psychomotor Activity  Psychomotor Activity: Restlessness   Assets  Assets: Manufacturing systems engineer; Desire for Improvement; Resilience; Financial Resources/Insurance   Sleep  Sleep: Fair  Number of hours:  6   Physical Exam: Physical Exam Vitals and nursing note reviewed.  Constitutional:      Appearance: Normal appearance.  HENT:     Head: Normocephalic.     Nose: Nose normal.  Eyes:     Extraocular Movements: Extraocular movements intact.  Cardiovascular:     Rate and Rhythm: Normal rate.  Pulmonary:     Effort: Pulmonary effort is normal.  Musculoskeletal:        General: Normal range of motion.     Cervical back: Normal range of motion.  Neurological:     General: No focal deficit present.     Mental Status: She is alert and oriented to person, place, and time.    Review of Systems  Constitutional: Negative.   HENT: Negative.    Eyes: Negative.   Respiratory: Negative.    Cardiovascular: Negative.    Gastrointestinal: Negative.   Genitourinary: Negative.   Musculoskeletal: Negative.   Neurological: Negative.   Endo/Heme/Allergies: Negative.   Psychiatric/Behavioral:  Positive for depression and substance abuse.    Blood pressure 124/84, pulse 87, temperature 98.3 F (36.8 C), temperature source Oral, resp. rate 16, last menstrual period 05/17/2014, SpO2 99%. There is no height or weight on file to calculate BMI.  Musculoskeletal: Strength & Muscle Tone: within normal limits Gait & Station: normal Patient leans: N/A   [facility] MSE Discharge Disposition for Follow up and Recommendations: Based on my evaluation I certify that psychiatric inpatient services furnished can reasonably be expected to improve the patient's condition which I recommend transfer to an appropriate accepting facility.  Patient is recommended for treatment in the facility based crisis unit for mood stabilization and detox from alcohol.  Patient is currently voluntary and reporting daily alcohol use for the past few years as well as worsening depressive symptoms. Treatment plan: - Initiate Ativan  taper for detox from alcohol - Initiate CIWA's - Continue patient's home medications - Restart patient on sertraline  25 mg daily for depression Meds ordered this encounter  Medications   acetaminophen  (TYLENOL ) tablet 650 mg   alum & mag hydroxide-simeth (MAALOX/MYLANTA) 200-200-20 MG/5ML suspension 30 mL   magnesium  hydroxide (MILK OF MAGNESIA)  suspension 30 mL   AND Linked Order Group    haloperidol  (HALDOL ) tablet 5 mg    diphenhydrAMINE  (BENADRYL ) capsule 50 mg   AND Linked Order Group    haloperidol  lactate (HALDOL ) injection 5 mg    diphenhydrAMINE  (BENADRYL ) injection 50 mg    LORazepam  (ATIVAN ) injection 2 mg   AND Linked Order Group    haloperidol  lactate (HALDOL ) injection 10 mg    diphenhydrAMINE  (BENADRYL ) injection 50 mg    LORazepam  (ATIVAN ) injection 2 mg   traZODone  (DESYREL ) tablet 50 mg    thiamine  (VITAMIN B1) injection 100 mg   thiamine  (VITAMIN B1) tablet 100 mg   multivitamin with minerals tablet 1 tablet   hydrOXYzine  (ATARAX ) tablet 25 mg   loperamide  (IMODIUM ) capsule 2-4 mg   ondansetron  (ZOFRAN -ODT) disintegrating tablet 4 mg   FOLLOWED BY Linked Order Group    LORazepam  (ATIVAN ) tablet 1 mg    LORazepam  (ATIVAN ) tablet 1 mg    LORazepam  (ATIVAN ) tablet 1 mg    LORazepam  (ATIVAN ) tablet 1 mg   divalproex  (DEPAKOTE ) DR tablet 500 mg   gabapentin  (NEURONTIN ) capsule 300 mg   folic acid  (FOLVITE ) tablet 1 mg   nicotine  (NICODERM CQ  - dosed in mg/24 hours) patch 21 mg   sertraline  (ZOLOFT ) tablet 25 mg    Lab Orders         CBC with Differential/Platelet         Comprehensive metabolic panel         Hemoglobin A1c         Magnesium          Ethanol         Lipid panel         TSH         Valproic acid  level         POCT Urine Drug Screen - (I-Screen)     Abnormal EKG was evaluated and compared with past results. Pt denies any chest pain/ tightness, heart palpitations, arm pain and vital signs are stable.   Davia Erps, NP 01/08/2024, 1:33 PM

## 2024-01-08 NOTE — BH Assessment (Signed)
 Comprehensive Clinical Assessment (CCA) Note  01/08/2024 Theresa Rios [id number]  DISPOSITION: Per Nadara Auerbach NP pt is recommended for detox in the [facility]  The patient demonstrates the following risk factors for suicide: Chronic risk factors for suicide include: psychiatric disorder of MDD and substance use disorder. Acute risk factors for suicide include: unemployment, social withdrawal/isolation, and loss (financial, interpersonal, professional). Protective factors for this patient include: hope for the future. Considering these factors, the overall suicide risk at this point appears to be low. Patient is appropriate for outpatient follow up.   Per Triage assessment: "Theresa Rios presents to [facility] voluntarily unaccompanied. Pt states that she needs to detox from drinking and cocaine. Pt states that she drank a lot of beer and liquor on yesterday and that she drank so much she fell down. Pt is very tearful as she shares that she needs to detox from alcohol and cocaine. Pt currently denies SI, HI, AVH and drug use."  With further assessment: Pt is a 58 yo female who presented voluntarily and unaccompanied after being referred to [facility] by her disability case worker. Pt stated that her case worker is trying to help her receive disability income. Pt stated that she was seeking detox from alcohol and cocaine in preparation for getting a residential placement at [facility] following detox. Hx of MDD and GAD. Pt stated that she is not prescribed any psychiatric medications, does not have a psychiatrist and does not have an OP therapist currently. Pt denied Si, HI, self-harm and paranoia. Pt reported previous inpatient psychiatric hospitalizations at [facility] in 2023 for SI. Pt denied any past suicide attempts. Pt denied any past childhood abuse or any current legal issues. Pt stated she does not have access to firearms.   Pt stated she is currently homeless and has been living in shelters for a few  years. Pt stated she has no family support and no other supports she can count on besides her disability case worker. Pt stated that for the last few years she has been drinking alcohol daily, usually until she "passes out" and falls asleep. Pt reported a hx of falls related to her alcohol consumption. Pt reported weekly cocaine use (about 2 times per week) in addition. Pt reported a history of seizures but, stated that they were unrelated to alcohol withdrawal symptoms. Pt stated that she drinks with such regularity that she has no idea what if any withdrawal symptoms she might have.   Pt was calm, cooperative, tearful, alert and seemed fully oriented. Pt's eye contact, speech and movement were within normal limits. Pt was dressed casually and seemed adequately groomed. Pt's mood was depressed and her flat, tearful affect was congruent. Pt's judgment and insight seemed impaired with some insight into her situation.    Chief Complaint:  Chief Complaint  Patient presents with   Alcohol Problem   Addiction Problem   Visit Diagnosis:  Alcohol Use d/o, Severe Stimulant Use d/o, Cocaine MDD, Recurrent, Moderate    CCA Screening, Triage and Referral (STR)  Patient Reported Information How did you hear about us ? Self  What Is the Reason for Your Visit/Call Today? [name] presents to [facility] voluntarily unaccompanied. Pt states that she needs to detox from drinking and cocaine. Pt states that she drank a lot of beer and liquor on yesterday and that she drank so much she fell down. Pt is very tearful as she shares that she needs to detox from alcohol and cocaine. Pt currently denies SI,  HI, AVH and drug use.  How Long Has This Been Causing You Problems? > than 6 months  What Do You Feel Would Help You the Most Today? Alcohol or Drug Use Treatment   Have You Recently Had Any Thoughts About Hurting Yourself? No  Are You Planning to Commit Suicide/Harm Yourself At This time? No   Flowsheet  Row ED from 01/08/2024 in [facility] ED from 12/03/2023 in [facility] Emergency Department at [facility] ED to Hosp-Admission (Discharged) from 08/30/2023 in [place] LONG 4TH FLOOR PROGRESSIVE CARE AND UROLOGY  C-SSRS RISK CATEGORY No Risk No Risk No Risk       Have you Recently Had Thoughts About Hurting Someone Marigene Shoulder? No  Are You Planning to Harm Someone at This Time? No  Explanation: na  Have You Used Any Alcohol or Drugs in the Past 24 Hours? Yes  How Long Ago Did You Use Drugs or Alcohol? yesterday What Did You Use and How Much? yesterday - drinking (beer & liquor a lot of it, that she fell dow)   Do You Currently Have a Therapist/Psychiatrist? No  Name of Therapist/Psychiatrist:    Have You Been Recently Discharged From Any Office Practice or Programs? No  Explanation of Discharge From Practice/Program: na    CCA Screening Triage Referral Assessment Type of Contact: Face-to-Face  Telemedicine Service Delivery:   Is this Initial or Reassessment?   Date Telepsych consult ordered in [facility]:    Time Telepsych consult ordered in [facility]:    Location of Assessment: [facility] Assessment Services  Provider Location: [facility] Assessment Services   Collateral Involvement: No collateral involved.   Does Patient Have a Automotive engineer Guardian? No  Legal Guardian Contact Information: na  Copy of Legal Guardianship Form: -- (na)  Legal Guardian Notified of Arrival: -- (na)  Legal Guardian Notified of Pending Discharge: -- (na)  If Minor and Not Living with Parent(s), Who has Custody? adult  Is CPS involved or ever been involved? -- (none reported)  Is APS involved or ever been involved? -- (none reported)   Patient Determined To Be At Risk for Harm To Self or Others Based on Review of Patient Reported Information or Presenting Complaint? No  Method: No Plan  Availability of Means: No access or NA  Intent: Vague intent or  NA  Notification Required: No need or identified person  Additional Information for Danger to Others Potential: -- (na)  Additional Comments for Danger to Others Potential: n/a  Are There Guns or Other Weapons in Your Home? No  Types of Guns/Weapons: no guns or weapons in her possesson  Are These Geophysical data processor Secured?                            -- (na)  Who Could Verify You Are Able To Have These Secured: n/a  Do You Have any Outstanding Charges, Pending Court Dates, Parole/Probation? denied  Contacted To Inform of Risk of Harm To Self or Others: -- (na)    Does Patient Present under Involuntary Commitment? No    [place] of Residence: [place]   Patient Currently Receiving the Following Services: Not Receiving Services   Determination of Need: Urgent (48 hours) (Per Nadara Auerbach NP pt is recommended for detox in the [facility])   Options For Referral: Facility-Based Crisis     CCA Biopsychosocial Patient Reported Schizophrenia/Schizoaffective Diagnosis in Past: No   Strengths: Able  and willing to ask for help   Mental Health Symptoms Depression:  Difficulty Concentrating; Change in energy/activity; Increase/decrease in appetite; Sleep (too much or little); Tearfulness; Hopelessness; Worthlessness; Fatigue   Duration of Depressive symptoms: Duration of Depressive Symptoms: Greater than two weeks   Mania:  None   Anxiety:   Worrying; Fatigue; Restlessness   Psychosis:  None   Duration of Psychotic symptoms:    Trauma:  None   Obsessions:  None   Compulsions:  None   Inattention:  None   Hyperactivity/Impulsivity:  None   Oppositional/Defiant Behaviors:  None   Emotional Irregularity:  Chronic feelings of emptiness; Transient, stress-related paranoia/disassociation; Potentially harmful impulsivity (per chart)   Other Mood/Personality Symptoms:  Depressed    Mental Status Exam Appearance and self-care  Stature:  Average   Weight:   Overweight   Clothing:  Casual   Grooming:  Normal   Cosmetic use:  None   Posture/gait:  Normal   Motor activity:  Slowed   Sensorium  Attention:  Distractible   Concentration:  Normal   Orientation:  Object; Person; Place; Situation; Time; X5   Recall/memory:  Normal   Affect and Mood  Affect:  Depressed; Flat; Tearful   Mood:  Worthless; Hopeless; Depressed; Angry; Anxious; Dysphoric   Relating  Eye contact:  Normal   Facial expression:  Sad; Responsive; Depressed   Attitude toward examiner:  Cooperative   Thought and Language  Speech flow: Clear and Coherent   Thought content:  Appropriate to Mood and Circumstances   Preoccupation:  None   Hallucinations:  None   Organization:  Coherent   [employer] of Knowledge:  Fair   Intelligence:  Average   Abstraction:  Functional   Judgement:  Impaired   Reality Testing:  Adequate   Insight:  Lacking   Decision Making:  Impulsive; Only simple   Social Functioning  Social Maturity:  Isolates   Social Judgement:  Victimized   Stress  Stressors:  Housing; Surveyor, quantity; Other (Comment) (No support)   Coping Ability:  Exhausted; Overwhelmed   Skill Deficits:  Decision making; Communication; Responsibility; Self-care; Self-control   Supports:  Support needed     Religion: Religion/Spirituality Are You A Religious Person?: No (UTA) How Might This Affect Treatment?: unknown  Leisure/Recreation: Leisure / Recreation Do You Have Hobbies?: No  Exercise/Diet: Exercise/Diet Do You Exercise?: Yes What Type of Exercise Do You Do?: Run/Walk How Many Times a Week Do You Exercise?: Daily Have You Gained or Lost A Significant Amount of Weight in the Past Six Months?: No Do You Follow a Special Diet?: No Do You Have Any Trouble Sleeping?: Yes Explanation of Sleeping Difficulties: Pt reports she is not sleeping well at night.   CCA Employment/Education Employment/Work  Situation: Employment / Work Systems developer: Unemployed (Applied for disability for her mental health) Patient's Job has Been Impacted by Current Illness: No Has Patient ever Been in the [employer]?: No  Education: Education Is Patient Currently Attending School?: No Last Grade Completed: 12 Did You Product manager?: No Did You Have An Individualized Education Program (IIEP): No Did You Have Any Difficulty At School?: No   CCA Family/Childhood History Family and Relationship History: Family history Marital status: Single Does patient have children?: No  Childhood History:  Childhood History By whom was/is the patient raised?: Mother Did patient suffer any verbal/emotional/physical/sexual abuse as a child?: No Has patient ever been sexually abused/assaulted/raped as an adolescent or adult?: No (States there were a lot  of people in the household.) Witnessed domestic violence?: No Has patient been affected by domestic violence as an adult?: No       CCA Substance Use Alcohol/Drug Use: Alcohol / Drug Use Pain Medications: See MAR Prescriptions: See MAR Over the Counter: See MAR History of alcohol / drug use?: Yes Longest period of sobriety (when/how long): Pt reports no sobreity Negative Consequences of Use: Personal relationships (per chart) Withdrawal Symptoms: Agitation, Seizures (per chart) Onset of Seizures: unknown (Pt thinks they are unrelated to alcohol withdrawal.) Date of most recent seizure: unknown Substance #1 Name of Substance 1: alcohol 1 - Age of First Use: teen 1 - Amount (size/oz): varies 1 - Frequency: daily 1 - Duration: ongoing 1 - Last Use / Amount: yesterday 1 - Method of Aquiring: purchase 1- Route of Use: drink, oral Substance #2 Name of Substance 2: cocaine 2 - Age of First Use: unknown 2 - Amount (size/oz): varies 2 - Frequency: 2 times per week 2 - Duration: ongoing 2 - Last Use / Amount: few days ago 2 - Method of  Aquiring: unknown 2 - Route of Substance Use: smoke                     ASAM's:  Six Dimensions of Multidimensional Assessment  Dimension 1:  Acute Intoxication and/or Withdrawal Potential:   Dimension 1:  Description of individual's past and current experiences of substance use and withdrawal: Pt denies any withdrawal symptoms because she stated she drinks regularly to keep them away.  Dimension 2:  Biomedical Conditions and Complications:   Dimension 2:  Description of patient's biomedical conditions and  complications: Pt reports hernia  Dimension 3:  Emotional, Behavioral, or Cognitive Conditions and Complications:  Dimension 3:  Description of emotional, behavioral, or cognitive conditions and complications: Major Depression  Dimension 4:  Readiness to Change:  Dimension 4:  Description of Readiness to Change criteria: contemplation  Dimension 5:  Relapse, Continued use, or Continued Problem Potential:  Dimension 5:  Relapse, continued use, or continued problem potential critiera description: continued using  Dimension 6:  Recovery/Living Environment:  Dimension 6:  Recovery/Iiving environment criteria description: Currently homeless- living in shelters  ASAM Severity Score: ASAM's Severity Rating Score: 12  ASAM Recommended Level of Treatment: ASAM Recommended Level of Treatment: Level III Residential Treatment   Substance use Disorder (SUD) Substance Use Disorder (SUD)  Checklist Symptoms of Substance Use: Continued use despite having a persistent/recurrent physical/psychological problem caused/exacerbated by use, Continued use despite persistent or recurrent social, interpersonal problems, caused or exacerbated by use, Evidence of tolerance, Presence of craving or strong urge to use, Social, occupational, recreational activities given up or reduced due to use, Recurrent use that results in a failure to fulfill major role obligations (work, school, home)  Recommendations for  Services/Supports/Treatments: Recommendations for Services/Supports/Treatments Recommendations For Services/Supports/Treatments: Insurance claims handler (for detox before longer term care)  Disposition Recommendation per psychiatric provider: We recommend transfer to [facility]. 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/02/2022   MDD (major depressive disorder), recurrent episode, moderate (HCC) 05/10/2022   MDD (major depressive disorder), recurrent severe, without psychosis (HCC) 05/06/2022   Seizure (  HCC) 04/11/2022   MDD (major depressive episode), single episode, severe, no psychosis (HCC) 03/21/2022   Alcohol use disorder 03/21/2022   Anxiety state 03/21/2022   Insomnia 03/21/2022     Referrals to Alternative Service(s): Referred to Alternative Service(s):   Place:   Date:   Time:    Referred to Alternative Service(s):   Place:   Date:   Time:    Referred to Alternative Service(s):   Place:   Date:   Time:    Referred to Alternative Service(s):   Place:   Date:   Time:     Leisa Gault T, Counselor

## 2024-01-08 NOTE — Group Note (Signed)
 Group Topic: Wellness  Group Date: 01/08/2024 Start Time: 1928 End Time: 2000 Facilitators: Wendall Halls B  Department: [facility]  Number of Participants: 3  Group Focus: abuse issues, activities of daily living skills, check in, chemical dependency issues, coping skills, daily focus, goals/reality orientation, healthy friendships, and social skills Treatment Modality:  Individual Therapy Interventions utilized were leisure development, patient education, and support Purpose: express feelings, increase insight, reinforce self-care, and relapse prevention strategies  Name: Theresa Rios Date of Birth: 1966-05-07  MR: [id number]    Level of Participation: PT DID NOT ATTEND GROUP Quality of Participation: cooperative Interactions with others: gave feedback Mood/Affect: appropriate Triggers (if applicable): NA Cognition: coherent/clear Progress: None Response: NA Plan: patient will be encouraged to keep going to group  Patients Problems:  Patient Active Problem List   Diagnosis Date Noted   Cocaine abuse (HCC) 01/21/2023   Non compliance with medical treatment 01/21/2023   Housing instability 09/06/2022   MDD (major depressive disorder), recurrent episode, severe (HCC) 08/31/2022   MDD (major depressive disorder), recurrent, severe, 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

## 2024-01-08 NOTE — Progress Notes (Signed)
 Pt presented for walk in assessment. Pt inquired if she was able to stay following intake. Therapist educated walk in appointment was for intake for therapy services and assessment for appropriate level of care for treatment. Pt stated was informed to present to [facility] for detox and residential services. Therapist educated possible service downstairs and could present to follow up. Inquired about current withdrawal sxs and pt denied. Denied SI/HI. Stated " I do not want to go back out there" and stated wanted residential services for SA- alcohol. Provided information on [facility]. Pt contacted case worker who informed her to present to [facility] for [facility].

## 2024-01-09 DIAGNOSIS — F1420 Cocaine dependence, uncomplicated: Secondary | ICD-10-CM | POA: Diagnosis not present

## 2024-01-09 DIAGNOSIS — R9431 Abnormal electrocardiogram [ECG] [EKG]: Secondary | ICD-10-CM | POA: Diagnosis present

## 2024-01-09 DIAGNOSIS — G40919 Epilepsy, unspecified, intractable, without status epilepticus: Secondary | ICD-10-CM | POA: Diagnosis not present

## 2024-01-09 DIAGNOSIS — F1010 Alcohol abuse, uncomplicated: Secondary | ICD-10-CM | POA: Diagnosis not present

## 2024-01-09 DIAGNOSIS — E559 Vitamin D deficiency, unspecified: Secondary | ICD-10-CM | POA: Diagnosis not present

## 2024-01-09 LAB — VITAMIN B12: Vitamin B-12: 507 pg/mL (ref 180–914)

## 2024-01-09 LAB — VITAMIN D 25 HYDROXY (VIT D DEFICIENCY, FRACTURES): Vit D, 25-Hydroxy: 26.24 ng/mL — ABNORMAL LOW (ref 30–100)

## 2024-01-09 MED ORDER — NICOTINE 14 MG/24HR TD PT24
14.0000 mg | MEDICATED_PATCH | Freq: Every day | TRANSDERMAL | Status: DC
  Administered 2024-01-09 – 2024-01-14 (×6): 14 mg via TRANSDERMAL
  Filled 2024-01-09 (×2): qty 1
  Filled 2024-01-09: qty 14
  Filled 2024-01-09 (×3): qty 1

## 2024-01-09 MED ORDER — GABAPENTIN 300 MG PO CAPS
300.0000 mg | ORAL_CAPSULE | Freq: Three times a day (TID) | ORAL | Status: DC
  Administered 2024-01-09 – 2024-01-14 (×18): 300 mg via ORAL
  Filled 2024-01-09 (×14): qty 1
  Filled 2024-01-09: qty 21
  Filled 2024-01-09 (×4): qty 1

## 2024-01-09 MED ORDER — DIVALPROEX SODIUM 125 MG PO DR TAB
125.0000 mg | DELAYED_RELEASE_TABLET | Freq: Two times a day (BID) | ORAL | Status: AC
  Administered 2024-01-10 (×2): 125 mg via ORAL
  Filled 2024-01-09 (×2): qty 1

## 2024-01-09 MED ORDER — MIRTAZAPINE 7.5 MG PO TABS
7.5000 mg | ORAL_TABLET | Freq: Every evening | ORAL | Status: DC | PRN
  Administered 2024-01-13 – 2024-01-14 (×2): 7.5 mg via ORAL
  Filled 2024-01-09: qty 1
  Filled 2024-01-09: qty 7
  Filled 2024-01-09: qty 1

## 2024-01-09 MED ORDER — MELATONIN 5 MG PO TABS
5.0000 mg | ORAL_TABLET | Freq: Every day | ORAL | Status: DC
  Administered 2024-01-09 – 2024-01-14 (×6): 5 mg via ORAL
  Filled 2024-01-09: qty 7
  Filled 2024-01-09 (×6): qty 1

## 2024-01-09 MED ORDER — CHLORDIAZEPOXIDE HCL 5 MG PO CAPS
10.0000 mg | ORAL_CAPSULE | Freq: Three times a day (TID) | ORAL | Status: AC
  Administered 2024-01-09 – 2024-01-10 (×3): 10 mg via ORAL
  Filled 2024-01-09 (×3): qty 2

## 2024-01-09 MED ORDER — NICOTINE 14 MG/24HR TD PT24
14.0000 mg | MEDICATED_PATCH | Freq: Once | TRANSDERMAL | Status: DC
  Filled 2024-01-09: qty 1

## 2024-01-09 MED ORDER — DIVALPROEX SODIUM 250 MG PO DR TAB
250.0000 mg | DELAYED_RELEASE_TABLET | Freq: Two times a day (BID) | ORAL | Status: AC
  Administered 2024-01-09 (×2): 250 mg via ORAL
  Filled 2024-01-09 (×2): qty 1

## 2024-01-09 MED ORDER — CHLORDIAZEPOXIDE HCL 5 MG PO CAPS
5.0000 mg | ORAL_CAPSULE | Freq: Three times a day (TID) | ORAL | Status: AC
  Administered 2024-01-10 – 2024-01-11 (×3): 5 mg via ORAL
  Filled 2024-01-09 (×3): qty 1

## 2024-01-09 NOTE — ED Notes (Signed)
 Patient in the bedroom sleeping with eyes closed. NAD. Respirations even and unlabored Will keep monitoring for safety.

## 2024-01-09 NOTE — ED Notes (Signed)
 Patient is awake and alert on unit sitting in dayroom eating breakfast and socializing with peers.  She is calm and cooperative with care.  No withdrawal.  Denies avh shi or plan. Will monitor.

## 2024-01-09 NOTE — Group Note (Signed)
 Group Topic: Recovery Basics  Group Date: 01/09/2024 Start Time: 1400 End Time: 1430 Facilitators: Arlan Belling, RN  Department: [facility]  Number of Participants: 7  Group Focus: chemical dependency education and chemical dependency issues Treatment Modality:  Behavior Modification Therapy Interventions utilized were clarification, orientation, and patient education Purpose: enhance coping skills, explore maladaptive thinking, express feelings, express irrational fears, improve communication skills, increase insight, regain self-worth, reinforce self-care, relapse prevention strategies, and trigger / craving management  Name: Theresa Rios Date of Birth: 1966-07-07  MR: [id number]    Level of Participation: minimal Quality of Participation: attentive Interactions with others: gave feedback Mood/Affect: appropriate Triggers (if applicable):   Cognition: coherent/clear Progress: Gaining insight Response:   Plan: follow-up needed  Patients Problems:  Patient Active Problem List   Diagnosis Date Noted   QT prolongation 01/09/2024   Cocaine abuse (HCC) 01/21/2023   Non compliance with medical treatment 01/21/2023   Housing instability 09/06/2022   MDD (major depressive disorder), recurrent episode, severe (HCC) 08/31/2022   MDD (major depressive disorder), recurrent, severe, 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

## 2024-01-09 NOTE — Discharge Planning (Signed)
 LCSW made call back to [facility] to check patients referral status and she was accepted but will not have a bed ready until Wednesday. This SW will make patient and provider aware and will re-connect with Moira Andrews on Monday with regards to patient's status and anticipated dc date.

## 2024-01-09 NOTE — ED Provider Notes (Signed)
 Facility Based Crisis Admission H&P  Date: 01/09/24 Patient Name: Theresa Rios MRN: [id number] Chief Complaint: "I don't want that life no more"  Diagnoses:  Final diagnoses:  None    HPI: 58 YO F with a history of depression, anxiety, insomnia, seizure disorder, alcohol and cocaine dependence who presents on a voluntary basis seeking detox and treatment of her low mood. She explains that she had been staying at the [facility] and has a SW, who advised her to come in for detox and rehab. She has been using alcohol and cocaine daily "as much as I could". She manages her own medications, and says that she has been taking it, though the VPA level was below therapeutic levels. She has had a seizure as recently as a month ago. She has been having difficulties with chronic back pain. She recently lost ability to see her previous PCP due to change in insurance, and does not appear to have a new one. She is tearful throughout, and appears to have some word finding difficulties. Overnight she thought she heard voices but it could have been people in adjoining rooms. She is not hearing the voices or seeing anything today. She has been suicidal recently, but not today on interview. No current thoughts of harm to others. She is fearful about her future and endorses helplessness with regards to her situation.   PHQ 2-9:  Flowsheet Row Office Visit from 04/22/2023 in [facility]  Thoughts that you would be better off dead, or of hurting yourself in some way Nearly every day  PHQ-9 Total Score 27       Flowsheet Row ED from 01/08/2024 in [facility] Most recent reading at 01/08/2024  4:39 PM ED from 01/08/2024 in [facility] Most recent reading at 01/08/2024 10:49 AM ED from 12/03/2023 in [facility] Emergency Department at [facility] Most recent reading at 12/03/2023 11:25 PM  C-SSRS RISK CATEGORY No Risk No Risk No Risk        Screenings    Flowsheet Row Most Recent Value  CIWA-Ar Total 0       Total Time spent with patient: 30 minutes  Musculoskeletal  Strength & Muscle Tone: within normal limits Gait & Station: normal Patient leans: N/A  Psychiatric Specialty Exam  Presentation General Appearance:  Casual  Eye Contact: Fair  Speech: Clear and Coherent  Speech Volume: Decreased  Handedness:No data recorded  Mood and Affect  Mood: Depressed; Anxious; Hopeless  Affect: Congruent; Depressed; Tearful   Thought Process  Thought Processes: Coherent  Descriptions of Associations:Intact  Orientation:Full (Time, Place and Person)  Thought Content:WDL  Diagnosis of Schizophrenia or Schizoaffective disorder in past: No   Hallucinations:Hallucinations: None  Ideas of Reference:None  Suicidal Thoughts:Suicidal Thoughts: No  Homicidal Thoughts:Homicidal Thoughts: No   Sensorium  Memory: Recent Fair; Immediate Good  Judgment: Fair  Insight: Fair   Chartered certified accountant: Fair  Attention Span: Fair  Recall: [employer] of Knowledge: Fair  Language: Fair   Psychomotor Activity  Psychomotor Activity: Psychomotor Activity: Restlessness   Assets  Assets: Communication Skills; Desire for Improvement; Resilience; Financial Resources/Insurance   Sleep  Sleep: Sleep: Fair Number of Hours of Sleep: 6   Nutritional Assessment (For OBS and FBC admissions only) Has the patient had a weight loss or gain of 10 pounds or more in the last 3 months?: No Has the patient had a decrease in food intake/or appetite?: No Does  the patient have dental problems?: No Does the patient have eating habits or behaviors that may be indicators of an eating disorder including binging or inducing vomiting?: No Has the patient recently lost weight without trying?: 0 Has the patient been eating poorly because of a decreased appetite?: 0 Malnutrition Screening Tool  Score: 0    Physical Exam Vitals and nursing note reviewed.  HENT:     Head: Normocephalic.     Nose: Nose normal.  Eyes:     Extraocular Movements: Extraocular movements intact.  Pulmonary:     Effort: Pulmonary effort is normal.  Musculoskeletal:        General: Normal range of motion.     Cervical back: Normal range of motion.  Neurological:     General: No focal deficit present.     Mental Status: She is alert and oriented to person, place, and time.  Psychiatric:        Behavior: Behavior normal.    Review of Systems  Constitutional:  Negative for chills and fever.  Respiratory:  Negative for cough and shortness of breath.   Cardiovascular:  Negative for chest pain.  Gastrointestinal:  Negative for constipation, diarrhea, nausea and vomiting.  Genitourinary:  Negative for dysuria.  Musculoskeletal:  Positive for back pain.  Skin:  Negative for rash.  Psychiatric/Behavioral:  Positive for depression and substance abuse.     Blood pressure (!) 104/90, pulse 74, temperature 98.5 F (36.9 C), temperature source Oral, resp. rate 18, last menstrual period 05/17/2014, SpO2 93%. There is no height or weight on file to calculate BMI.  Past Psychiatric History: patient has previously been to [facility].   Previously has been on depakote , but appears to be chronically non-compliant with this. Also has been on gabapentin , keppra , olanzapine , zoloft , trazodone , ativan , hydroxyzine . Has had 2 previous suicide attempts.   Is the patient at risk to self? No  Has the patient been a risk to self in the past 6 months? Yes .    Has the patient been a risk to self within the distant past? Yes   Is the patient a risk to others? No   Has the patient been a risk to others in the past 6 months? No   Has the patient been a risk to others within the distant past? No   Past Medical History:  Has a seizure disorder that is not stable due to non-compliance with medications, but also likely worsened  by substance use.   Family History: denies Social History: currently homeless and staying at the [facility]. Unemployed and seeking disability. No kids. Single. Has one sister remaining.  Substance abuse: extensive history of cocaine, alcohol and cannabis use. Has been to the [facility]. [facility] also [facility] Currently drinks at least 4-5 beers per day, using cocaine daily.   Last Labs:  Admission on 01/08/2024, Discharged on 01/08/2024  Component Date Value Ref Range Status   WBC 01/08/2024 10.1  4.0 - 10.5 K/uL Final   RBC 01/08/2024 4.97  3.87 - 5.11 MIL/uL Final   Hemoglobin 01/08/2024 14.4  12.0 - 15.0 g/dL Final   HCT 16/06/9603 44.6  36.0 - 46.0 % Final   MCV 01/08/2024 89.7  80.0 - 100.0 fL Final   MCH 01/08/2024 29.0  26.0 - 34.0 pg Final   MCHC 01/08/2024 32.3  30.0 - 36.0 g/dL Final   RDW 54/05/8118 14.3  11.5 - 15.5 % Final   Platelets 01/08/2024 262  150 - 400 K/uL Final   nRBC 01/08/2024 0.0  0.0 - 0.2 % Final   Neutrophils Relative % 01/08/2024 53  % Final   Neutro Abs 01/08/2024 5.3  1.7 - 7.7 K/uL Final   Lymphocytes Relative 01/08/2024 32  % Final   Lymphs Abs 01/08/2024 3.2  0.7 - 4.0 K/uL Final   Monocytes Relative 01/08/2024 14  % Final   Monocytes Absolute 01/08/2024 1.4 (H)  0.1 - 1.0 K/uL Final   Eosinophils Relative 01/08/2024 1  % Final   Eosinophils Absolute 01/08/2024 0.1  0.0 - 0.5 K/uL Final   Basophils Relative 01/08/2024 0  % Final   Basophils Absolute 01/08/2024 0.0  0.0 - 0.1 K/uL Final   Immature Granulocytes 01/08/2024 0  % Final   Abs Immature Granulocytes 01/08/2024 0.03  0.00 - 0.07 K/uL Final   Performed at [facility], [address] [place], [place] [address]   Sodium 01/08/2024 139  135 - 145 mmol/L Final   Potassium 01/08/2024 4.7  3.5 - 5.1 mmol/L Final   Chloride 01/08/2024 101  98 - 111 mmol/L Final   CO2 01/08/2024 27  22 - 32 mmol/L Final   Glucose, Bld 01/08/2024 78  70 - 99 mg/dL Final    Glucose reference range applies only to samples taken after fasting for at least 8 hours.   BUN 01/08/2024 9  6 - 20 mg/dL Final   Creatinine, Ser 01/08/2024 0.71  0.44 - 1.00 mg/dL Final   Calcium 60/45/4098 9.6  8.9 - 10.3 mg/dL Final   Total Protein 11/91/4782 6.7  6.5 - 8.1 g/dL Final   Albumin 95/62/1308 3.6  3.5 - 5.0 g/dL Final   AST 65/78/4696 25  15 - 41 U/L Final   ALT 01/08/2024 22  0 - 44 U/L Final   Alkaline Phosphatase 01/08/2024 57  38 - 126 U/L Final   Total Bilirubin 01/08/2024 0.6  0.0 - 1.2 mg/dL Final   GFR, Estimated 01/08/2024 >60  >60 mL/min Final   Comment: (NOTE) Calculated using the CKD-EPI Creatinine Equation (2021)    Anion gap 01/08/2024 11  5 - 15 Final   Performed at [facility], [address], [place], [place] [address]   Hgb A1c MFr Bld 01/08/2024 5.5  4.8 - 5.6 % Final   Comment: (NOTE) Pre diabetes:          5.7%-6.4%  Diabetes:              >6.4%  Glycemic control for   <7.0% adults with diabetes    Mean Plasma Glucose 01/08/2024 111.15  mg/dL Final   Performed at [facility], [address], [place], [place] [address]   Magnesium  01/08/2024 2.4  1.7 - 2.4 mg/dL Final   Performed at [facility], [address] [place], [place] [address]   Alcohol, Ethyl (B) 01/08/2024 <15  <15 mg/dL Final   Comment: Please note change in reference range. (NOTE) For medical purposes only. Performed at [facility], [address] [place], [place] [address]    Cholesterol 01/08/2024 214 (H)  0 - 200 mg/dL Final   Triglycerides 64/40/3474 120  <150 mg/dL Final   HDL 25/95/6387 99  >40 mg/dL Final   Total CHOL/HDL Ratio 01/08/2024 2.2  RATIO Final   VLDL 01/08/2024 24  0 - 40 mg/dL Final   LDL Cholesterol 01/08/2024 91  0 - 99 mg/dL Final   Comment:  Total Cholesterol/HDL:CHD Risk Coronary Heart Disease Risk Table                     Men   Women  1/2 Average Risk   3.4   3.3  Average Risk       5.0   4.4  2 X Average  Risk   9.6   7.1  3 X Average Risk  23.4   11.0        Use the calculated Patient Ratio above and the CHD Risk Table to determine the patient's CHD Risk.        ATP III CLASSIFICATION (LDL):  <100     mg/dL   Optimal  161-096  mg/dL   Near or Above                    Optimal  130-159  mg/dL   Borderline  045-409  mg/dL   High  >811     mg/dL   Very High Performed at [facility], [address], [place], [place] [address]    TSH 01/08/2024 1.077  0.350 - 4.500 uIU/mL Final   Comment: Performed by a 3rd Generation assay with a functional sensitivity of <=0.01 uIU/mL. Performed at [facility], [address], [place], [place] [address]    POC Amphetamine UR 01/08/2024 None Detected  NONE DETECTED (Cut Off Level 1000 ng/mL) Final   POC Secobarbital (BAR) 01/08/2024 None Detected  NONE DETECTED (Cut Off Level 300 ng/mL) Final   POC Buprenorphine (BUP) 01/08/2024 None Detected  NONE DETECTED (Cut Off Level 10 ng/mL) Final   POC Oxazepam (BZO) 01/08/2024 None Detected  NONE DETECTED (Cut Off Level 300 ng/mL) Final   POC Cocaine UR 01/08/2024 Positive (A)  NONE DETECTED (Cut Off Level 300 ng/mL) Final   POC Methamphetamine UR 01/08/2024 None Detected  NONE DETECTED (Cut Off Level 1000 ng/mL) Final   POC Morphine  01/08/2024 None Detected  NONE DETECTED (Cut Off Level 300 ng/mL) Final   POC Methadone UR 01/08/2024 None Detected  NONE DETECTED (Cut Off Level 300 ng/mL) Final   POC Oxycodone UR 01/08/2024 None Detected  NONE DETECTED (Cut Off Level 100 ng/mL) Final   POC Marijuana UR 01/08/2024 Positive (A)  NONE DETECTED (Cut Off Level 50 ng/mL) Final   Valproic Acid  Lvl 01/08/2024 35 (L)  50 - 100 ug/mL Final   Performed at [facility], [address] [place], [place] [address]  Admission on 12/03/2023, Discharged on 12/04/2023  Component Date Value Ref Range Status   WBC 12/03/2023 10.1  4.0 - 10.5 K/uL Final   RBC 12/03/2023 5.15 (H)  3.87 - 5.11 MIL/uL Final    Hemoglobin 12/03/2023 14.7  12.0 - 15.0 g/dL Final   HCT 57/84/6962 46.0  36.0 - 46.0 % Final   MCV 12/03/2023 89.3  80.0 - 100.0 fL Final   MCH 12/03/2023 28.5  26.0 - 34.0 pg Final   MCHC 12/03/2023 32.0  30.0 - 36.0 g/dL Final   RDW 95/28/4132 14.6  11.5 - 15.5 % Final   Platelets 12/03/2023 210  150 - 400 K/uL Final   REPEATED TO VERIFY   nRBC 12/03/2023 0.0  0.0 - 0.2 % Final   Neutrophils Relative % 12/03/2023 72  % Final   Neutro Abs 12/03/2023 7.3  1.7 - 7.7 K/uL Final   Lymphocytes Relative 12/03/2023 19  % Final   Lymphs Abs 12/03/2023 1.9  0.7 -  4.0 K/uL Final   Monocytes Relative 12/03/2023 8  % Final   Monocytes Absolute 12/03/2023 0.8  0.1 - 1.0 K/uL Final   Eosinophils Relative 12/03/2023 1  % Final   Eosinophils Absolute 12/03/2023 0.1  0.0 - 0.5 K/uL Final   Basophils Relative 12/03/2023 0  % Final   Basophils Absolute 12/03/2023 0.0  0.0 - 0.1 K/uL Final   Immature Granulocytes 12/03/2023 0  % Final   Abs Immature Granulocytes 12/03/2023 0.03  0.00 - 0.07 K/uL Final   Performed at [facility], [address], [place], [place] [address]   Color, Urine 12/04/2023 YELLOW  YELLOW Final   APPearance 12/04/2023 CLEAR  CLEAR Final   Specific Gravity, Urine 12/04/2023 1.011  1.005 - 1.030 Final   pH 12/04/2023 6.0  5.0 - 8.0 Final   Glucose, UA 12/04/2023 NEGATIVE  NEGATIVE mg/dL Final   Hgb urine dipstick 12/04/2023 NEGATIVE  NEGATIVE Final   Bilirubin Urine 12/04/2023 NEGATIVE  NEGATIVE Final   Ketones, ur 12/04/2023 NEGATIVE  NEGATIVE mg/dL Final   Protein, ur 62/13/0865 NEGATIVE  NEGATIVE mg/dL Final   Nitrite 78/46/9629 NEGATIVE  NEGATIVE Final   Leukocytes,Ua 12/04/2023 NEGATIVE  NEGATIVE Final   Performed at [facility], [address] [place], [place] [address]   Valproic Acid  Lvl 12/03/2023 <10 (L)  50.0 - 100.0 ug/mL Final   Comment: RESULT CONFIRMED BY MANUAL DILUTION Performed at [facility], [address] [place], [place] [address]     Glucose-Capillary 12/03/2023 143 (H)  70 - 99 mg/dL Final   Glucose reference range applies only to samples taken after fasting for at least 8 hours.   Sodium 12/04/2023 141  135 - 145 mmol/L Final   Potassium 12/04/2023 4.2  3.5 - 5.1 mmol/L Final   HEMOLYSIS AT THIS LEVEL MAY AFFECT RESULT   Chloride 12/04/2023 108  98 - 111 mmol/L Final   CO2 12/04/2023 24  22 - 32 mmol/L Final   Glucose, Bld 12/04/2023 118 (H)  70 - 99 mg/dL Final   Glucose reference range applies only to samples taken after fasting for at least 8 hours.   BUN 12/04/2023 5 (L)  6 - 20 mg/dL Final   Creatinine, Ser 12/04/2023 0.74  0.44 - 1.00 mg/dL Final   Calcium 07/06/2535 9.1  8.9 - 10.3 mg/dL Final   Total Protein 64/40/3474 6.4 (L)  6.5 - 8.1 g/dL Final   Albumin 25/95/6387 3.3 (L)  3.5 - 5.0 g/dL Final   AST 56/43/3295 26  15 - 41 U/L Final   HEMOLYSIS AT THIS LEVEL MAY AFFECT RESULT   ALT 12/04/2023 17  0 - 44 U/L Final   HEMOLYSIS AT THIS LEVEL MAY AFFECT RESULT   Alkaline Phosphatase 12/04/2023 67  38 - 126 U/L Final   Total Bilirubin 12/04/2023 0.9  0.0 - 1.2 mg/dL Final   HEMOLYSIS AT THIS LEVEL MAY AFFECT RESULT   GFR, Estimated 12/04/2023 >60  >60 mL/min Final   Comment: (NOTE) Calculated using the CKD-EPI Creatinine Equation (2021)    Anion gap 12/04/2023 9  5 - 15 Final   Performed at [facility], [address] [place], [place] [address]   Magnesium  12/04/2023 2.1  1.7 - 2.4 mg/dL Final   Performed at [facility], [address] [place], [place] [address]  Office Visit on 10/28/2023  Component Date Value Ref Range Status   Valproic Acid  Lvl 10/28/2023 47 (L)  50 -  100 ug/mL Final   Comment:                                 Detection Limit = 4                            <4 indicates None Detected Toxicity may occur at levels of 100-500. Measurements of free unbound valproic acid  may improve the assess- ment of clinical response.    Glucose 10/28/2023 105 (H)  70 - 99 mg/dL  Final   BUN 16/06/9603 5 (L)  6 - 24 mg/dL Final   Creatinine, Ser 10/28/2023 0.66  0.57 - 1.00 mg/dL Final   eGFR 54/05/8118 102  >59 mL/min/1.73 Final   BUN/Creatinine Ratio 10/28/2023 8 (L)  9 - 23 Final   Sodium 10/28/2023 143  134 - 144 mmol/L Final   Potassium 10/28/2023 4.0  3.5 - 5.2 mmol/L Final   Chloride 10/28/2023 101  96 - 106 mmol/L Final   CO2 10/28/2023 21  20 - 29 mmol/L Final   Calcium 10/28/2023 9.8  8.7 - 10.2 mg/dL Final   Total Protein 14/78/2956 7.1  6.0 - 8.5 g/dL Final   Albumin 21/30/8657 4.3  3.8 - 4.9 g/dL Final   Globulin, Total 10/28/2023 2.8  1.5 - 4.5 g/dL Final   Bilirubin Total 10/28/2023 <0.2  0.0 - 1.2 mg/dL Final   Alkaline Phosphatase 10/28/2023 99  44 - 121 IU/L Final   AST 10/28/2023 19  0 - 40 IU/L Final   ALT 10/28/2023 14  0 - 32 IU/L Final  Admission on 08/30/2023, Discharged on 08/31/2023  Component Date Value Ref Range Status   WBC 08/30/2023 15.0 (H)  4.0 - 10.5 K/uL Final   RBC 08/30/2023 5.47 (H)  3.87 - 5.11 MIL/uL Final   Hemoglobin 08/30/2023 15.3 (H)  12.0 - 15.0 g/dL Final   HCT 84/69/6295 48.8 (H)  36.0 - 46.0 % Final   MCV 08/30/2023 89.2  80.0 - 100.0 fL Final   MCH 08/30/2023 28.0  26.0 - 34.0 pg Final   MCHC 08/30/2023 31.4  30.0 - 36.0 g/dL Final   RDW 28/41/3244 14.8  11.5 - 15.5 % Final   Platelets 08/30/2023 240  150 - 400 K/uL Final   nRBC 08/30/2023 0.0  0.0 - 0.2 % Final   Neutrophils Relative % 08/30/2023 71  % Final   Neutro Abs 08/30/2023 10.7 (H)  1.7 - 7.7 K/uL Final   Lymphocytes Relative 08/30/2023 20  % Final   Lymphs Abs 08/30/2023 3.0  0.7 - 4.0 K/uL Final   Monocytes Relative 08/30/2023 8  % Final   Monocytes Absolute 08/30/2023 1.2 (H)  0.1 - 1.0 K/uL Final   Eosinophils Relative 08/30/2023 0  % Final   Eosinophils Absolute 08/30/2023 0.1  0.0 - 0.5 K/uL Final   Basophils Relative 08/30/2023 0  % Final   Basophils Absolute 08/30/2023 0.0  0.0 - 0.1 K/uL Final   Immature Granulocytes 08/30/2023 1  %  Final   Abs Immature Granulocytes 08/30/2023 0.07  0.00 - 0.07 K/uL Final   Performed at [facility], [address], [place], [place] [address]   Sodium 08/30/2023 135  135 - 145 mmol/L Final   Potassium 08/30/2023 3.3 (L)  3.5 - 5.1 mmol/L Final   Chloride 08/30/2023 98  98 - 111 mmol/L Final   CO2  08/30/2023 19 (L)  22 - 32 mmol/L Final   Glucose, Bld 08/30/2023 150 (H)  70 - 99 mg/dL Final   Glucose reference range applies only to samples taken after fasting for at least 8 hours.   BUN 08/30/2023 8  6 - 20 mg/dL Final   Creatinine, Ser 08/30/2023 0.82  0.44 - 1.00 mg/dL Final   Calcium 16/06/9603 8.9  8.9 - 10.3 mg/dL Final   Total Protein 54/05/8118 8.8 (H)  6.5 - 8.1 g/dL Final   Albumin 14/78/2956 4.5  3.5 - 5.0 g/dL Final   AST 21/30/8657 35  15 - 41 U/L Final   ALT 08/30/2023 22  0 - 44 U/L Final   Alkaline Phosphatase 08/30/2023 87  38 - 126 U/L Final   Total Bilirubin 08/30/2023 0.6  <1.2 mg/dL Final   GFR, Estimated 08/30/2023 >60  >60 mL/min Final   Comment: (NOTE) Calculated using the CKD-EPI Creatinine Equation (2021)    Anion gap 08/30/2023 18 (H)  5 - 15 Final   Performed at [facility], [address] [place], [place] [address]   Alcohol, Ethyl (B) 08/30/2023 <10  <10 mg/dL Final   Comment: (NOTE) Lowest detectable limit for serum alcohol is 10 mg/dL.  For medical purposes only. Performed at [facility], [address] [place], [place] [address]    Opiates 08/30/2023 NONE DETECTED  NONE DETECTED Final   Cocaine 08/30/2023 POSITIVE (A)  NONE DETECTED Final   Benzodiazepines 08/30/2023 NONE DETECTED  NONE DETECTED Final   Amphetamines 08/30/2023 NONE DETECTED  NONE DETECTED Final   Tetrahydrocannabinol 08/30/2023 NONE DETECTED  NONE DETECTED Final   Barbiturates 08/30/2023 NONE DETECTED  NONE DETECTED Final   Comment: (NOTE) DRUG SCREEN FOR MEDICAL PURPOSES ONLY.  IF CONFIRMATION IS NEEDED FOR ANY  PURPOSE, NOTIFY LAB WITHIN 5 DAYS.  LOWEST DETECTABLE LIMITS FOR URINE DRUG SCREEN Drug Class                     Cutoff (ng/mL) Amphetamine and metabolites    1000 Barbiturate and metabolites    200 Benzodiazepine                 200 Opiates and metabolites        300 Cocaine and metabolites        300 THC                            50 Performed at [facility], [address] [place], [place] [address]    HIV Screen 4th Generation wRfx 08/31/2023 Non Reactive  Non Reactive Final   Performed at [facility], [address], [place], [place] [address]   Sodium 08/31/2023 136  135 - 145 mmol/L Final   Potassium 08/31/2023 3.1 (L)  3.5 - 5.1 mmol/L Final   Chloride 08/31/2023 102  98 - 111 mmol/L Final   CO2 08/31/2023 24  22 - 32 mmol/L Final   Glucose, Bld 08/31/2023 97  70 - 99 mg/dL Final   Glucose reference range applies only to samples taken after fasting for at least 8 hours.   BUN 08/31/2023 8  6 - 20 mg/dL Final   Creatinine, Ser 08/31/2023 0.52  0.44 - 1.00 mg/dL Final   Calcium 72/53/6644 9.4  8.9 - 10.3 mg/dL Final   Total Protein 03/47/4259 7.6  6.5 - 8.1 g/dL Final   Albumin 56/38/7564 3.8  3.5 - 5.0 g/dL Final   AST 60/45/4098 17  15 - 41 U/L Final   ALT 08/31/2023 15  0 - 44 U/L Final   Alkaline Phosphatase 08/31/2023 65  38 - 126 U/L Final   Total Bilirubin 08/31/2023 1.3 (H)  <1.2 mg/dL Final   GFR, Estimated 08/31/2023 >60  >60 mL/min Final   Comment: (NOTE) Calculated using the CKD-EPI Creatinine Equation (2021)    Anion gap 08/31/2023 10  5 - 15 Final   Performed at [facility], [address] [place], [place] [address]   WBC 08/31/2023 12.5 (H)  4.0 - 10.5 K/uL Final   RBC 08/31/2023 5.17 (H)  3.87 - 5.11 MIL/uL Final   Hemoglobin 08/31/2023 14.2  12.0 - 15.0 g/dL Final   HCT 78/29/5621 44.3  36.0 - 46.0 % Final   MCV 08/31/2023 85.7  80.0 - 100.0 fL Final   MCH 08/31/2023 27.5  26.0 - 34.0 pg Final   MCHC  08/31/2023 32.1  30.0 - 36.0 g/dL Final   RDW 30/86/5784 14.6  11.5 - 15.5 % Final   Platelets 08/31/2023 193  150 - 400 K/uL Final   nRBC 08/31/2023 0.0  0.0 - 0.2 % Final   Performed at [facility], [address], [place], [place] [address]   Magnesium  08/31/2023 2.5 (H)  1.7 - 2.4 mg/dL Final   Performed at [facility], [address] [place], [place] [address]    Allergies: Patient has no known allergies.  Medications:  Facility Ordered Medications  Medication   acetaminophen  (TYLENOL ) tablet 650 mg   alum & mag hydroxide-simeth (MAALOX/MYLANTA) 200-200-20 MG/5ML suspension 30 mL   magnesium  hydroxide (MILK OF MAGNESIA) suspension 30 mL   haloperidol  (HALDOL ) tablet 5 mg   And   diphenhydrAMINE  (BENADRYL ) capsule 50 mg   haloperidol  lactate (HALDOL ) injection 5 mg   And   diphenhydrAMINE  (BENADRYL ) injection 50 mg   And   LORazepam  (ATIVAN ) injection 2 mg   haloperidol  lactate (HALDOL ) injection 10 mg   And   diphenhydrAMINE  (BENADRYL ) injection 50 mg   And   LORazepam  (ATIVAN ) injection 2 mg   traZODone  (DESYREL ) tablet 50 mg   divalproex  (DEPAKOTE ) DR tablet 500 mg   folic acid  (FOLVITE ) tablet 1 mg   gabapentin  (NEURONTIN ) capsule 300 mg   thiamine  (VITAMIN B1) injection 100 mg   thiamine  (VITAMIN B1) tablet 100 mg   multivitamin with minerals tablet 1 tablet   hydrOXYzine  (ATARAX ) tablet 25 mg   loperamide  (IMODIUM ) capsule 2-4 mg   ondansetron  (ZOFRAN -ODT) disintegrating tablet 4 mg   LORazepam  (ATIVAN ) tablet 1 mg   Followed by   Cecily Cohen ON 01/10/2024] LORazepam  (ATIVAN ) tablet 1 mg   Followed by   Cecily Cohen ON 01/11/2024] LORazepam  (ATIVAN ) tablet 1 mg   Followed by   Cecily Cohen ON 01/12/2024] LORazepam  (ATIVAN ) tablet 1 mg   PTA Medications  Medication Sig   divalproex  (DEPAKOTE ) 500 MG DR tablet Take 1 tablet (500 mg total) by mouth every 12 (twelve) hours.   folic acid  (FOLVITE ) 1 MG tablet Take 1 tablet (1 mg total) by  mouth daily.   gabapentin  (NEURONTIN ) 300 MG capsule Take 1 capsule (300 mg total) by mouth at bedtime.   thiamine  (VITAMIN B1) 100 MG tablet Take 1 tablet (100 mg total) by mouth daily.    Long Term Goals: Improvement in symptoms so as ready for discharge  Short Term Goals: Patient will verbalize feelings in meetings  with treatment team members., Patient will attend at least of 50% of the groups daily., Pt will complete the PHQ9 on admission, day 3 and discharge., Patient will participate in completing the [place] Suicide Severity Rating Scale, Patient will score a low risk of violence for 24 hours prior to discharge, and Patient will take medications as prescribed daily.  Medical Decision Making  Patient appears to be in significant distress with regards to depressed mood and is struggling with substance use, and has not been able to quit on her own.  Librium  taper as tolerated, CIWA with PRN lorazepam , thiamine  replacement for alcohol dependence Tapering depakote , patient is chronically non-compliant and using alcohol, also on an additional anti-seizure agent Increase gabapentin  300mg  PO TID for chronic pain and seizure control Nutrition labs for poor PO intake and substance use Ammonia and lipase for liver function evaluation    Recommendations  Based on my evaluation the patient does not appear to have an emergency medical condition.  Floyce Hutching, MD 01/09/24  8:16 AM

## 2024-01-09 NOTE — [facility] Group Notes (Signed)
 SPIRITUALITY GROUP NOTE  Spirituality group facilitated by Chaplain Ameliyah Sarno, MDiv, [facility].  Group Description: Group focused on topic of hope. Patients participated in facilitated discussion around topic, connecting with one another around experiences and definitions for hope. Group members engaged with visual explorer photos, reflecting on what hope looks like for them today. Group engaged in discussion around how their definitions of hope are present today in hospital.  Modalities: Psycho-social ed, Adlerian, Narrative, MI  Patient Progress: Present throughout group.  Engaged with facilitator and other group members.  Appropriate orientation to topic.

## 2024-01-09 NOTE — ED Notes (Signed)
Pt A&O x 4, awake & resting at present.  No distress noted.  Monitoring for safety.

## 2024-01-09 NOTE — Treatment Plan (Signed)
 LCSW and MD met with patient this morning to assess current status and disposition plans. 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 She reports drinking natural ice and whatever alcohol is presented by others on the streets until she is intoxicated to the point of "falling down or passing out". She stated that she has a seizure disorder and takes medications twice daily for them. Patient reports having only one support person who is her disability case worker. She reports being in process of getting her disability benefits. She remains homeless and has stayed at [facility]. Patient reports being single with no children and having only one sister living though minimally interactive. She reports smoking cigarettes with a 2 pack a day habit and is wearing a patch at present. Patient reported a history of rehab treatments at [facility] in the past and would like to return to this residential treatment program. LCSW will make referral to [facility] and will provide updates as received.

## 2024-01-09 NOTE — ED Notes (Signed)
Pt resting at present, no distress noted.  Monitoring for safety. 

## 2024-01-10 ENCOUNTER — Encounter (HOSPITAL_COMMUNITY): Payer: Self-pay | Admitting: Psychiatry

## 2024-01-10 DIAGNOSIS — F17200 Nicotine dependence, unspecified, uncomplicated: Secondary | ICD-10-CM

## 2024-01-10 DIAGNOSIS — G40919 Epilepsy, unspecified, intractable, without status epilepticus: Secondary | ICD-10-CM | POA: Diagnosis not present

## 2024-01-10 DIAGNOSIS — R4701 Aphasia: Secondary | ICD-10-CM

## 2024-01-10 DIAGNOSIS — E559 Vitamin D deficiency, unspecified: Secondary | ICD-10-CM | POA: Diagnosis not present

## 2024-01-10 DIAGNOSIS — F1290 Cannabis use, unspecified, uncomplicated: Secondary | ICD-10-CM | POA: Diagnosis present

## 2024-01-10 DIAGNOSIS — R7989 Other specified abnormal findings of blood chemistry: Secondary | ICD-10-CM | POA: Diagnosis present

## 2024-01-10 DIAGNOSIS — F1010 Alcohol abuse, uncomplicated: Secondary | ICD-10-CM | POA: Diagnosis not present

## 2024-01-10 DIAGNOSIS — F1420 Cocaine dependence, uncomplicated: Secondary | ICD-10-CM | POA: Diagnosis not present

## 2024-01-10 HISTORY — DX: Epilepsy, unspecified, intractable, without status epilepticus: G40.919

## 2024-01-10 HISTORY — DX: Aphasia: R47.01

## 2024-01-10 HISTORY — DX: Nicotine dependence, unspecified, uncomplicated: F17.200

## 2024-01-10 LAB — AMMONIA: Ammonia: 57 umol/L — ABNORMAL HIGH (ref 9–35)

## 2024-01-10 MED ORDER — ACAMPROSATE CALCIUM 333 MG PO TBEC
666.0000 mg | DELAYED_RELEASE_TABLET | Freq: Three times a day (TID) | ORAL | Status: DC
  Administered 2024-01-10 – 2024-01-14 (×13): 666 mg via ORAL
  Filled 2024-01-10 (×10): qty 2
  Filled 2024-01-10: qty 42
  Filled 2024-01-10 (×3): qty 2

## 2024-01-10 MED ORDER — THIAMINE MONONITRATE 100 MG PO TABS
100.0000 mg | ORAL_TABLET | Freq: Every day | ORAL | Status: DC
  Administered 2024-01-11 – 2024-01-14 (×4): 100 mg via ORAL
  Filled 2024-01-10 (×2): qty 1
  Filled 2024-01-10: qty 7
  Filled 2024-01-10 (×2): qty 1

## 2024-01-10 MED ORDER — LACTULOSE 10 GM/15ML PO SOLN
20.0000 g | Freq: Two times a day (BID) | ORAL | Status: DC
  Administered 2024-01-10 – 2024-01-11 (×2): 20 g via ORAL
  Filled 2024-01-10 (×2): qty 30

## 2024-01-10 MED ORDER — CHOLECALCIFEROL 10 MCG (400 UNIT) PO TABS
400.0000 [IU] | ORAL_TABLET | Freq: Every day | ORAL | Status: DC
  Administered 2024-01-10 – 2024-01-14 (×5): 400 [IU] via ORAL
  Filled 2024-01-10: qty 7
  Filled 2024-01-10: qty 1
  Filled 2024-01-10: qty 7
  Filled 2024-01-10 (×3): qty 1

## 2024-01-10 NOTE — ED Notes (Signed)
 Patient resting quietly in bed with eyes closed, Respirations equal and unlabored, skin warm and dry, no S/S of acute alcohol withdrawal. Routine safety checks conducted according to facility protocol. Will continue to monitor for safety.

## 2024-01-10 NOTE — ED Provider Notes (Signed)
 Behavioral Health Progress Note  Date and Time: 01/10/2024 8:26 PM Name: Theresa Rios MRN:  [id number] CC: detox   Subjective:  Theresa Rios is a 58 y.o. 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 Forgot her last name. Thought it was Jan. Didn't know her age.   When asked days of the week forward, had a hard time because she needed to use words already on the board in the room to answer the question. Same tactic used to answer naming questions--watch, pen. Had to put the purple folder or bottle up the the wall to tell that it was purple. Unable to differentiate green and blue clothes she was wearing.  This had been ongoing for a long time, but unsure when it started. Also has lapse in memory, where she would wake up and was in the hospital after having a seizure. Doesn't remember seeing neruo outpatient for seizure.  Doesn't know why she has seizures. Has a hard time remembering to take her meds.  Denied med side effects at this time.  Mood:Depressed, Hopeless, Labile, Anxious Tearful when talking about homelessness. Wants to be able to work one day, unsure why she keeps drinking and using.  Sleep:Good Appetite: good SI:No HI:No WUJ:WJXB  Review of Systems  Respiratory:  Negative for shortness of breath.   Cardiovascular:  Negative for chest pain.  Gastrointestinal:  Negative for nausea and vomiting.  Neurological:  Negative for dizziness and headaches.    Diagnosis:  Final diagnoses:  Alcohol use disorder  Cocaine use disorder, severe, dependence (HCC)  Cannabis use disorder  Nicotine  use disorder  Intractable  epilepsy without status epilepticus, unspecified epilepsy type (HCC)  QT prolongation  Low vitamin D level    Past Psychiatric History: See H&P Past Medical History: See H&P Family History: See H&P Family Psychiatric  History: See H&P Social History: See H&P  Total Time spent with patient: 30 minutes  Additional Social History:                         Current Medications:  Current Facility-Administered Medications  Medication Dose Route Frequency Provider Last Rate Last Admin  . acamprosate (CAMPRAL) tablet 666 mg  666 mg Oral TID with meals Anabela Crayton, DO   666 mg at 01/10/24 1722  . acetaminophen  (TYLENOL ) tablet 650 mg  650 mg Oral Q6H PRN Brent, Amanda C, NP   650 mg at 01/10/24 0740  . alum & mag hydroxide-simeth (MAALOX/MYLANTA) 200-200-20 MG/5ML suspension 30 mL  30 mL Oral Q4H PRN Brent, Amanda C, NP      . chlordiazePOXIDE  (LIBRIUM ) capsule 5 mg  5 mg Oral TID Floyce Hutching, MD   5 mg at 01/10/24 1543  . cholecalciferol (VITAMIN D3) 10 MCG (400 UNIT) tablet 400 Units  400 Units Oral Daily Floyce Hutching, MD   400 Units at 01/10/24 0949  . haloperidol  (HALDOL ) tablet 5 mg  5 mg Oral TID PRN Brent, Amanda C, NP       And  . diphenhydrAMINE  (BENADRYL ) capsule 50 mg  50 mg Oral TID PRN Davia Erps, NP      .  haloperidol  lactate (HALDOL ) injection 5 mg  5 mg Intramuscular TID PRN Brent, Amanda C, NP       And  . diphenhydrAMINE  (BENADRYL ) injection 50 mg  50 mg Intramuscular TID PRN Davia Erps, NP       And  . LORazepam  (ATIVAN ) injection 2 mg  2 mg Intramuscular TID PRN Brent, Amanda C, NP      . haloperidol  lactate (HALDOL ) injection 10 mg  10 mg Intramuscular TID PRN Davia Erps, NP       And  . diphenhydrAMINE  (BENADRYL ) injection 50 mg  50 mg Intramuscular TID PRN Davia Erps, NP       And  . LORazepam  (ATIVAN ) injection 2 mg  2 mg Intramuscular TID PRN Brent, Amanda C, NP      . divalproex  (DEPAKOTE ) DR tablet 125 mg  125 mg  Oral Q12H Hill, Alonza Arthurs, MD   125 mg at 01/10/24 0913  . folic acid  (FOLVITE ) tablet 1 mg  1 mg Oral Daily Brent, Amanda C, NP   1 mg at 01/10/24 0981  . gabapentin  (NEURONTIN ) capsule 300 mg  300 mg Oral TID Floyce Hutching, MD   300 mg at 01/10/24 1543  . hydrOXYzine  (ATARAX ) tablet 25 mg  25 mg Oral Q6H PRN Brent, Amanda C, NP   25 mg at 01/08/24 2117  . lactulose (CHRONULAC) 10 GM/15ML solution 20 g  20 g Oral BID Libbie Bartley, DO      . loperamide  (IMODIUM ) capsule 2-4 mg  2-4 mg Oral PRN Brent, Amanda C, NP      . magnesium  hydroxide (MILK OF MAGNESIA) suspension 30 mL  30 mL Oral Daily PRN Brent, Amanda C, NP      . melatonin tablet 5 mg  5 mg Oral QHS Hill, Alonza Arthurs, MD   5 mg at 01/09/24 2202  . mirtazapine (REMERON) tablet 7.5 mg  7.5 mg Oral QHS PRN Hill, Alonza Arthurs, MD      . multivitamin with minerals tablet 1 tablet  1 tablet Oral Daily Brent, Amanda C, NP   1 tablet at 01/10/24 0913  . nicotine  (NICODERM CQ  - dosed in mg/24 hours) patch 14 mg  14 mg Transdermal Daily Hill, Alonza Arthurs, MD   14 mg at 01/10/24 0947  . ondansetron  (ZOFRAN -ODT) disintegrating tablet 4 mg  4 mg Oral Q6H PRN Davia Erps, NP      . thiamine  (VITAMIN B1) injection 100 mg  100 mg Intramuscular Once Brent, Amanda C, NP      . Cecily Cohen ON 01/11/2024] thiamine  (VITAMIN B1) tablet 100 mg  100 mg Oral QHS Averey Koning, DO       Current Outpatient Medications  Medication Sig Dispense Refill  . divalproex  (DEPAKOTE ) 500 MG DR tablet Take 1 tablet (500 mg total) by mouth every 12 (twelve) hours. 180 tablet 3  . folic acid  (FOLVITE ) 1 MG tablet Take 1 tablet (1 mg total) by mouth daily. 90 tablet 3  . gabapentin  (NEURONTIN ) 300 MG capsule Take 1 capsule (300 mg total) by mouth at bedtime. 90 capsule 3  . thiamine  (VITAMIN B1) 100 MG tablet Take 1 tablet (100 mg total) by mouth daily. 90 tablet 3   Labs  Lab Results:  Admission on 01/08/2024  Component Date Value Ref Range Status   . Ammonia 01/10/2024 57 (H)  9 - 35 umol/L Final   Performed at [facility], [address] [place], [place] [address]  .  Vitamin B-12 01/09/2024 507  180 - 914 pg/mL Final   Comment: (NOTE) This assay is not validated for testing neonatal or myeloproliferative syndrome specimens for Vitamin B12 levels. Performed at [facility], [address] [place], [place] [address]   . Vit D, 25-Hydroxy 01/09/2024 26.24 (L)  30 - 100 ng/mL Final   Comment: (NOTE) Vitamin D deficiency has been defined by the Institute of Medicine  and an Endocrine Society practice guideline as a level of serum 25-OH  vitamin D less than 20 ng/mL (1,2). The Endocrine Society went on to  further define vitamin D insufficiency as a level between 21 and 29  ng/mL (2).  1. IOM (Institute of Medicine). 2010. Dietary reference intakes for  calcium and D. [place]: [employer]. 2. Holick MF, Binkley , Bischoff-Ferrari HA, et al. Evaluation,  treatment, and prevention of vitamin D deficiency: an Endocrine  Society clinical practice guideline, JCEM. 2011 Jul; 96(7): 1911-30.  Performed at [facility], [address], [place], [place] [address]   Admission on 01/08/2024, Discharged on 01/08/2024  Component Date Value Ref Range Status  . WBC 01/08/2024 10.1  4.0 - 10.5 K/uL Final  . RBC 01/08/2024 4.97  3.87 - 5.11 MIL/uL Final  . Hemoglobin 01/08/2024 14.4  12.0 - 15.0 g/dL Final  . HCT 09/81/1914 44.6  36.0 - 46.0 % Final  . MCV 01/08/2024 89.7  80.0 - 100.0 fL Final  . MCH 01/08/2024 29.0  26.0 - 34.0 pg Final  . MCHC 01/08/2024 32.3  30.0 - 36.0 g/dL Final  . RDW 78/29/5621 14.3  11.5 - 15.5 % Final  . Platelets 01/08/2024 262  150 - 400 K/uL Final  . nRBC 01/08/2024 0.0  0.0 - 0.2 % Final  . Neutrophils Relative % 01/08/2024 53  % Final  . Neutro Abs 01/08/2024 5.3  1.7 - 7.7 K/uL Final  . Lymphocytes Relative 01/08/2024 32  % Final  . Lymphs Abs 01/08/2024 3.2   0.7 - 4.0 K/uL Final  . Monocytes Relative 01/08/2024 14  % Final  . Monocytes Absolute 01/08/2024 1.4 (H)  0.1 - 1.0 K/uL Final  . Eosinophils Relative 01/08/2024 1  % Final  . Eosinophils Absolute 01/08/2024 0.1  0.0 - 0.5 K/uL Final  . Basophils Relative 01/08/2024 0  % Final  . Basophils Absolute 01/08/2024 0.0  0.0 - 0.1 K/uL Final  . Immature Granulocytes 01/08/2024 0  % Final  . Abs Immature Granulocytes 01/08/2024 0.03  0.00 - 0.07 K/uL Final   Performed at [facility], [address], [place], [place] [address]  . Sodium 01/08/2024 139  135 - 145 mmol/L Final  . Potassium 01/08/2024 4.7  3.5 - 5.1 mmol/L Final  . Chloride 01/08/2024 101  98 - 111 mmol/L Final  . CO2 01/08/2024 27  22 - 32 mmol/L Final  . Glucose, Bld 01/08/2024 78  70 - 99 mg/dL Final   Glucose reference range applies only to samples taken after fasting for at least 8 hours.  . BUN 01/08/2024 9  6 - 20 mg/dL Final  . Creatinine, Ser 01/08/2024 0.71  0.44 - 1.00 mg/dL Final  . Calcium 78/46/9629 9.6  8.9 - 10.3 mg/dL Final  . Total Protein 01/08/2024 6.7  6.5 - 8.1 g/dL Final  . Albumin 52/84/1324 3.6  3.5 - 5.0 g/dL Final  . AST 40/06/2724 25  15 - 41 U/L Final  . ALT 01/08/2024 22  0 -  44 U/L Final  . Alkaline Phosphatase 01/08/2024 57  38 - 126 U/L Final  . Total Bilirubin 01/08/2024 0.6  0.0 - 1.2 mg/dL Final  . GFR, Estimated 01/08/2024 >60  >60 mL/min Final   Comment: (NOTE) Calculated using the CKD-EPI Creatinine Equation (2021)   . Anion gap 01/08/2024 11  5 - 15 Final   Performed at [facility], [address], [place], [place] [address]  . Hgb A1c MFr Bld 01/08/2024 5.5  4.8 - 5.6 % Final   Comment: (NOTE) Pre diabetes:          5.7%-6.4%  Diabetes:              >6.4%  Glycemic control for   <7.0% adults with diabetes   . Mean Plasma Glucose 01/08/2024 111.15  mg/dL Final   Performed at [facility], [address] [place], [place] [address]  . Magnesium  01/08/2024 2.4   1.7 - 2.4 mg/dL Final   Performed at [facility], [address], [place], [place] [address]  . Alcohol, Ethyl (B) 01/08/2024 <15  <15 mg/dL Final   Comment: Please note change in reference range. (NOTE) For medical purposes only. Performed at [facility], [address] [place], [place] [address]   . Cholesterol 01/08/2024 214 (H)  0 - 200 mg/dL Final  . Triglycerides 01/08/2024 120  <150 mg/dL Final  . HDL 21/30/8657 99  >40 mg/dL Final  . Total CHOL/HDL Ratio 01/08/2024 2.2  RATIO Final  . VLDL 01/08/2024 24  0 - 40 mg/dL Final  . LDL Cholesterol 01/08/2024 91  0 - 99 mg/dL Final   Comment:        Total Cholesterol/HDL:CHD Risk Coronary Heart Disease Risk Table                     Men   Women  1/2 Average Risk   3.4   3.3  Average Risk       5.0   4.4  2 X Average Risk   9.6   7.1  3 X Average Risk  23.4   11.0        Use the calculated Patient Ratio above and the CHD Risk Table to determine the patient's CHD Risk.        ATP III CLASSIFICATION (LDL):  <100     mg/dL   Optimal  846-962  mg/dL   Near or Above                    Optimal  130-159  mg/dL   Borderline  952-841  mg/dL   High  >324     mg/dL   Very High Performed at [facility], [address], [place], [place] [address]   . TSH 01/08/2024 1.077  0.350 - 4.500 uIU/mL Final   Comment: Performed by a 3rd Generation assay with a functional sensitivity of <=0.01 uIU/mL. Performed at [facility], [address], [place], [place] [address]   . POC Amphetamine UR 01/08/2024 None Detected  NONE DETECTED (Cut Off Level 1000 ng/mL) Final  . POC Secobarbital (BAR) 01/08/2024 None Detected  NONE DETECTED (Cut Off Level 300 ng/mL) Final  . POC Buprenorphine (BUP) 01/08/2024 None Detected  NONE DETECTED (Cut Off Level 10 ng/mL) Final  . POC Oxazepam (BZO) 01/08/2024 None Detected  NONE DETECTED (Cut Off Level 300 ng/mL) Final  . POC Cocaine UR 01/08/2024  Positive (A)  NONE DETECTED (Cut Off  Level 300 ng/mL) Final  . POC Methamphetamine UR 01/08/2024 None Detected  NONE DETECTED (Cut Off Level 1000 ng/mL) Final  . POC Morphine  01/08/2024 None Detected  NONE DETECTED (Cut Off Level 300 ng/mL) Final  . POC Methadone UR 01/08/2024 None Detected  NONE DETECTED (Cut Off Level 300 ng/mL) Final  . POC Oxycodone UR 01/08/2024 None Detected  NONE DETECTED (Cut Off Level 100 ng/mL) Final  . POC Marijuana UR 01/08/2024 Positive (A)  NONE DETECTED (Cut Off Level 50 ng/mL) Final  . Valproic Acid  Lvl 01/08/2024 35 (L)  50 - 100 ug/mL Final   Performed at [facility], [address], [place], [place] [address]  Admission on 12/03/2023, Discharged on 12/04/2023  Component Date Value Ref Range Status  . WBC 12/03/2023 10.1  4.0 - 10.5 K/uL Final  . RBC 12/03/2023 5.15 (H)  3.87 - 5.11 MIL/uL Final  . Hemoglobin 12/03/2023 14.7  12.0 - 15.0 g/dL Final  . HCT 09/81/1914 46.0  36.0 - 46.0 % Final  . MCV 12/03/2023 89.3  80.0 - 100.0 fL Final  . MCH 12/03/2023 28.5  26.0 - 34.0 pg Final  . MCHC 12/03/2023 32.0  30.0 - 36.0 g/dL Final  . RDW 78/29/5621 14.6  11.5 - 15.5 % Final  . Platelets 12/03/2023 210  150 - 400 K/uL Final   REPEATED TO VERIFY  . nRBC 12/03/2023 0.0  0.0 - 0.2 % Final  . Neutrophils Relative % 12/03/2023 72  % Final  . Neutro Abs 12/03/2023 7.3  1.7 - 7.7 K/uL Final  . Lymphocytes Relative 12/03/2023 19  % Final  . Lymphs Abs 12/03/2023 1.9  0.7 - 4.0 K/uL Final  . Monocytes Relative 12/03/2023 8  % Final  . Monocytes Absolute 12/03/2023 0.8  0.1 - 1.0 K/uL Final  . Eosinophils Relative 12/03/2023 1  % Final  . Eosinophils Absolute 12/03/2023 0.1  0.0 - 0.5 K/uL Final  . Basophils Relative 12/03/2023 0  % Final  . Basophils Absolute 12/03/2023 0.0  0.0 - 0.1 K/uL Final  . Immature Granulocytes 12/03/2023 0  % Final  . Abs Immature Granulocytes 12/03/2023 0.03  0.00 - 0.07 K/uL Final   Performed at [facility], [address] [place], [place] [address]  .  Color, Urine 12/04/2023 YELLOW  YELLOW Final  . APPearance 12/04/2023 CLEAR  CLEAR Final  . Specific Gravity, Urine 12/04/2023 1.011  1.005 - 1.030 Final  . pH 12/04/2023 6.0  5.0 - 8.0 Final  . Glucose, UA 12/04/2023 NEGATIVE  NEGATIVE mg/dL Final  . Hgb urine dipstick 12/04/2023 NEGATIVE  NEGATIVE Final  . Bilirubin Urine 12/04/2023 NEGATIVE  NEGATIVE Final  . Ketones, ur 12/04/2023 NEGATIVE  NEGATIVE mg/dL Final  . Protein, ur 78/46/9629 NEGATIVE  NEGATIVE mg/dL Final  . Nitrite 52/84/1324 NEGATIVE  NEGATIVE Final  . Mickiel Albany 12/04/2023 NEGATIVE  NEGATIVE Final   Performed at [facility], [address] [place], [place] [address]  . Valproic Acid  Lvl 12/03/2023 <10 (L)  50.0 - 100.0 ug/mL Final   Comment: RESULT CONFIRMED BY MANUAL DILUTION Performed at [facility], [address], [place], [place] [address]   . Glucose-Capillary 12/03/2023 143 (H)  70 - 99 mg/dL Final   Glucose reference range applies only to samples taken after fasting for at least 8 hours.  . Sodium 12/04/2023 141  135 - 145 mmol/L Final  . Potassium 12/04/2023 4.2  3.5 - 5.1 mmol/L Final   HEMOLYSIS AT THIS LEVEL MAY AFFECT RESULT  . Chloride 12/04/2023 108  98 - 111 mmol/L Final  . CO2 12/04/2023 24  22 - 32 mmol/L Final  . Glucose, Bld 12/04/2023 118 (H)  70 - 99 mg/dL Final   Glucose reference range applies only to samples taken after fasting for at least 8 hours.  . BUN 12/04/2023 5 (L)  6 - 20 mg/dL Final  . Creatinine, Ser 12/04/2023 0.74  0.44 - 1.00 mg/dL Final  . Calcium 16/06/9603 9.1  8.9 - 10.3 mg/dL Final  . Total Protein 12/04/2023 6.4 (L)  6.5 - 8.1 g/dL Final  . Albumin 54/05/8118 3.3 (L)  3.5 - 5.0 g/dL Final  . AST 14/78/2956 26  15 - 41 U/L Final   HEMOLYSIS AT THIS LEVEL MAY AFFECT RESULT  . ALT 12/04/2023 17  0 - 44 U/L Final   HEMOLYSIS AT THIS LEVEL MAY AFFECT RESULT  . Alkaline Phosphatase 12/04/2023 67  38 - 126 U/L Final  . Total Bilirubin 12/04/2023 0.9  0.0 -  1.2 mg/dL Final   HEMOLYSIS AT THIS LEVEL MAY AFFECT RESULT  . GFR, Estimated 12/04/2023 >60  >60 mL/min Final   Comment: (NOTE) Calculated using the CKD-EPI Creatinine Equation (2021)   . Anion gap 12/04/2023 9  5 - 15 Final   Performed at [facility], [address], [place], [place] [address]  . Magnesium  12/04/2023 2.1  1.7 - 2.4 mg/dL Final   Performed at [facility], [address] [place], [place] [address]  Office Visit on 10/28/2023  Component Date Value Ref Range Status  . Valproic Acid  Lvl 10/28/2023 47 (L)  50 - 100 ug/mL Final   Comment:                                 Detection Limit = 4                            <4 indicates None Detected Toxicity may occur at levels of 100-500. Measurements of free unbound valproic acid  may improve the assess- ment of clinical response.   . Glucose 10/28/2023 105 (H)  70 - 99 mg/dL Final  . BUN 69/62/9528 5 (L)  6 - 24 mg/dL Final  . Creatinine, Ser 10/28/2023 0.66  0.57 - 1.00 mg/dL Final  . eGFR 41/32/4401 102  >59 mL/min/1.73 Final  . BUN/Creatinine Ratio 10/28/2023 8 (L)  9 - 23 Final  . Sodium 10/28/2023 143  134 - 144 mmol/L Final  . Potassium 10/28/2023 4.0  3.5 - 5.2 mmol/L Final  . Chloride 10/28/2023 101  96 - 106 mmol/L Final  . CO2 10/28/2023 21  20 - 29 mmol/L Final  . Calcium 10/28/2023 9.8  8.7 - 10.2 mg/dL Final  . Total Protein 10/28/2023 7.1  6.0 - 8.5 g/dL Final  . Albumin 02/72/5366 4.3  3.8 - 4.9 g/dL Final  . Globulin, Total 10/28/2023 2.8  1.5 - 4.5 g/dL Final  . Bilirubin Total 10/28/2023 <0.2  0.0 - 1.2 mg/dL Final  . Alkaline Phosphatase 10/28/2023 99  44 - 121 IU/L Final  . AST 10/28/2023 19  0 - 40 IU/L Final  . ALT 10/28/2023 14  0 - 32 IU/L Final  Admission on 08/30/2023, Discharged on 08/31/2023  Component Date Value Ref Range Status  .  WBC 08/30/2023 15.0 (H)  4.0 - 10.5 K/uL Final  . RBC 08/30/2023 5.47 (H)  3.87 - 5.11 MIL/uL Final  . Hemoglobin 08/30/2023 15.3 (H)  12.0 - 15.0  g/dL Final  . HCT 16/06/9603 48.8 (H)  36.0 - 46.0 % Final  . MCV 08/30/2023 89.2  80.0 - 100.0 fL Final  . MCH 08/30/2023 28.0  26.0 - 34.0 pg Final  . MCHC 08/30/2023 31.4  30.0 - 36.0 g/dL Final  . RDW 54/05/8118 14.8  11.5 - 15.5 % Final  . Platelets 08/30/2023 240  150 - 400 K/uL Final  . nRBC 08/30/2023 0.0  0.0 - 0.2 % Final  . Neutrophils Relative % 08/30/2023 71  % Final  . Neutro Abs 08/30/2023 10.7 (H)  1.7 - 7.7 K/uL Final  . Lymphocytes Relative 08/30/2023 20  % Final  . Lymphs Abs 08/30/2023 3.0  0.7 - 4.0 K/uL Final  . Monocytes Relative 08/30/2023 8  % Final  . Monocytes Absolute 08/30/2023 1.2 (H)  0.1 - 1.0 K/uL Final  . Eosinophils Relative 08/30/2023 0  % Final  . Eosinophils Absolute 08/30/2023 0.1  0.0 - 0.5 K/uL Final  . Basophils Relative 08/30/2023 0  % Final  . Basophils Absolute 08/30/2023 0.0  0.0 - 0.1 K/uL Final  . Immature Granulocytes 08/30/2023 1  % Final  . Abs Immature Granulocytes 08/30/2023 0.07  0.00 - 0.07 K/uL Final   Performed at [facility], [address], [place], [place] [address]  . Sodium 08/30/2023 135  135 - 145 mmol/L Final  . Potassium 08/30/2023 3.3 (L)  3.5 - 5.1 mmol/L Final  . Chloride 08/30/2023 98  98 - 111 mmol/L Final  . CO2 08/30/2023 19 (L)  22 - 32 mmol/L Final  . Glucose, Bld 08/30/2023 150 (H)  70 - 99 mg/dL Final   Glucose reference range applies only to samples taken after fasting for at least 8 hours.  . BUN 08/30/2023 8  6 - 20 mg/dL Final  . Creatinine, Ser 08/30/2023 0.82  0.44 - 1.00 mg/dL Final  . Calcium 95/62/1308 8.9  8.9 - 10.3 mg/dL Final  . Total Protein 08/30/2023 8.8 (H)  6.5 - 8.1 g/dL Final  . Albumin 65/78/4696 4.5  3.5 - 5.0 g/dL Final  . AST 29/52/8413 35  15 - 41 U/L Final  . ALT 08/30/2023 22  0 - 44 U/L Final  . Alkaline Phosphatase 08/30/2023 87  38 - 126 U/L Final  . Total Bilirubin 08/30/2023 0.6  <1.2 mg/dL Final  . GFR, Estimated 08/30/2023 >60  >60 mL/min Final    Comment: (NOTE) Calculated using the CKD-EPI Creatinine Equation (2021)   . Anion gap 08/30/2023 18 (H)  5 - 15 Final   Performed at [facility], [address], [place], [place] [address]  . Alcohol, Ethyl (B) 08/30/2023 <10  <10 mg/dL Final   Comment: (NOTE) Lowest detectable limit for serum alcohol is 10 mg/dL.  For medical purposes only. Performed at [facility], [address] [place], [place] [address]   . Opiates 08/30/2023 NONE DETECTED  NONE DETECTED Final  . Cocaine 08/30/2023 POSITIVE (A)  NONE DETECTED Final  . Benzodiazepines 08/30/2023 NONE DETECTED  NONE DETECTED Final  . Amphetamines 08/30/2023 NONE DETECTED  NONE DETECTED Final  . Tetrahydrocannabinol 08/30/2023 NONE DETECTED  NONE DETECTED Final  . Barbiturates 08/30/2023 NONE DETECTED  NONE DETECTED Final   Comment: (NOTE) DRUG SCREEN FOR MEDICAL PURPOSES ONLY.  IF CONFIRMATION IS NEEDED FOR ANY PURPOSE, NOTIFY LAB WITHIN 5 DAYS.  LOWEST DETECTABLE LIMITS FOR URINE DRUG SCREEN Drug Class                     Cutoff (ng/mL) Amphetamine and metabolites    1000 Barbiturate and metabolites    200 Benzodiazepine                 200 Opiates and metabolites        300 Cocaine and metabolites        300 THC                            50 Performed at [facility], [address] [place], [place] [address]   . HIV Screen 4th Generation wRfx 08/31/2023 Non Reactive  Non Reactive Final   Performed at [facility], [address], [place], [place] [address]  . Sodium 08/31/2023 136  135 - 145 mmol/L Final  . Potassium 08/31/2023 3.1 (L)  3.5 - 5.1 mmol/L Final  . Chloride 08/31/2023 102  98 - 111 mmol/L Final  . CO2 08/31/2023 24  22 - 32 mmol/L Final  . Glucose, Bld 08/31/2023 97  70 - 99 mg/dL Final   Glucose reference range applies only to samples taken after fasting for at least 8 hours.  . BUN 08/31/2023 8  6 - 20 mg/dL Final  . Creatinine, Ser  08/31/2023 0.52  0.44 - 1.00 mg/dL Final  . Calcium 09/81/1914 9.4  8.9 - 10.3 mg/dL Final  . Total Protein 08/31/2023 7.6  6.5 - 8.1 g/dL Final  . Albumin 78/29/5621 3.8  3.5 - 5.0 g/dL Final  . AST 30/86/5784 17  15 - 41 U/L Final  . ALT 08/31/2023 15  0 - 44 U/L Final  . Alkaline Phosphatase 08/31/2023 65  38 - 126 U/L Final  . Total Bilirubin 08/31/2023 1.3 (H)  <1.2 mg/dL Final  . GFR, Estimated 08/31/2023 >60  >60 mL/min Final   Comment: (NOTE) Calculated using the CKD-EPI Creatinine Equation (2021)   . Anion gap 08/31/2023 10  5 - 15 Final   Performed at [facility], [address], [place], [place] [address]  . WBC 08/31/2023 12.5 (H)  4.0 - 10.5 K/uL Final  . RBC 08/31/2023 5.17 (H)  3.87 - 5.11 MIL/uL Final  . Hemoglobin 08/31/2023 14.2  12.0 - 15.0 g/dL Final  . HCT 52/84/1324 44.3  36.0 - 46.0 % Final  . MCV 08/31/2023 85.7  80.0 - 100.0 fL Final  . MCH 08/31/2023 27.5  26.0 - 34.0 pg Final  . MCHC 08/31/2023 32.1  30.0 - 36.0 g/dL Final  . RDW 40/06/2724 14.6  11.5 - 15.5 % Final  . Platelets 08/31/2023 193  150 - 400 K/uL Final  . nRBC 08/31/2023 0.0  0.0 - 0.2 % Final   Performed at [facility], [address], [place], [place] [address]  . Magnesium  08/31/2023 2.5 (H)  1.7 - 2.4 mg/dL Final   Performed at [facility], [address], [place], [place] [address]    Blood Alcohol level:  Lab Results  Component Value Date   [facility] <15 01/08/2024   ETH <10 08/30/2023    Metabolic Disorder Labs: Lab Results  Component Value Date   HGBA1C 5.5 01/08/2024   MPG 111.15 01/08/2024   MPG 114 08/03/2022  No results found for: "PROLACTIN" Lab Results  Component Value Date   CHOL 214 (H) 01/08/2024   TRIG 120 01/08/2024   HDL 99 01/08/2024   CHOLHDL 2.2 01/08/2024   VLDL 24 01/08/2024   LDLCALC 91 01/08/2024   LDLCALC 130 (H) 09/01/2022    Therapeutic Lab Levels: No results found for: "LITHIUM" Lab  Results  Component Value Date   VALPROATE 35 (L) 01/08/2024   VALPROATE <10 (L) 12/03/2023   No results found for: "CBMZ"  Physical Findings   AIMS    Flowsheet Row [facility]  AIMS Total Score 0      AUDIT    Flowsheet Row ED from 01/08/2024 in [facility] Admission (Discharged) from OP [facility]) [facility] (Discharged) from OP [facility]Discharged[facility]  Alcohol Use Disorder Identification Test Final Score (AUDIT) 40 11 11 12 11       CAGE-AID    Flowsheet Row ED to Hosp-Admission (Discharged) from 11/27/2022 in [facility] ED to Hosp-Admission (Discharged) from 04/11/2022 in [facility]  CAGE-AID Score 1 2      GAD-7    Flowsheet Row Office Visit from 04/22/2023 in [facility]  Total GAD-7 Score 21      PHQ2-9    Flowsheet Row Office Visit from 04/22/2023 in [facility] Office Visit from 04/03/2022 in [facility]  PHQ-2 Total Score 6 0  PHQ-9 Total Score 27 --      Flowsheet Row ED from 01/08/2024 in [facility] Most recent reading at 01/08/2024  4:39 PM ED from 01/08/2024 in [facility] Most recent reading at 01/08/2024 10:49 AM ED from 12/03/2023 in [facility] Emergency Department at [facility] Most recent reading at 12/03/2023 11:25 PM  C-SSRS RISK CATEGORY No Risk No Risk No Risk        Musculoskeletal  Strength & Muscle Tone: within normal limited Gait & Station: normal Patient leans: NA   Psychiatric  Specialty Exam  Presentation General Appearance:Disheveled Eye Contact:Fair Speech:Normal Rate, Slurred (delatyed at times, spontaneous, herverbal) Volume:Decreased Handedness:Right  Mood and Affect  Mood:Depressed, Hopeless, Labile, Anxious Affect:Appropriate, Congruent, Depressed, Tearful, Labile  Thought Process  Thought Process:Goal Directed, Coherent Descriptions of Associations:Circumstantial (tagential at times)  Thought Content Suicidal Thoughts:No Homicidal Thoughts:No Hallucinations:None Ideas of Reference:None Thought Content:Rumination, Perseveration, Scattered, Tangential (perseverated on her time homeless and that she is tired of using. wants to get better and stop using.)  Sensorium  Memory:Immediate Poor, Recent Fair, Remote Fair Judgment:Fair Insight:Fair  Executive Functions  Orientation:Full (Time, Place and Person) Language:Poor (scanning speech, not using vowels, paraphasia, no fluent, a lot of content errors, perseverative. comprehension intact) Concentration:Poor (couldn't go days of the week forward when asked, but was able to do it earlier in the convo when she was just talking) Attention:Poor Recall:Fair Fund of Knowledge:Fair  Psychomotor Activity  Psychomotor Activity:Psychomotor Activity: Normal  Assets  Assets:Communication Skills, Desire for Improvement  Sleep  Quality:Good Duration:   hours  Physical Exam  Physical Exam Vitals and nursing note reviewed.  Constitutional:      General: She is not in acute distress.  Appearance: She is not ill-appearing or diaphoretic.  HENT:     Head: Normocephalic and atraumatic.  Eyes:     Conjunctiva/sclera: Conjunctivae normal.  Pulmonary:     Effort: Pulmonary effort is normal. No respiratory distress.  Neurological:     Mental Status: She is alert.     Gait: Gait normal.    Blood pressure 120/74, pulse 94, temperature 98.7 F (37.1 C), temperature source Oral, resp. rate 18, last  menstrual period 05/17/2014, SpO2 99%. There is no height or weight on file to calculate BMI.  Treatment Plan Summary: Daily contact with patient to assess and evaluate symptoms and progress in treatment and Medication management Principal Problem:   Alcohol use disorder Active Problems:   Anxiety state   Insomnia   MDD (major depressive disorder), recurrent episode, severe (HCC)   Cocaine use disorder, severe, dependence (HCC)   QT prolongation   Low vitamin D level   Epilepsy, unspecified, intractable, without status epilepticus (HCC)   Nicotine  use disorder   Cannabis use disorder   Increased ammonia level   Expressive aphasia  Tolerating librium  taper and med changes well. Still tearful.  Ammonia level returned, starting lactulose.  For AUD, would like to start naltrexone for her, however concerned liver issues, which was why depakote  is being tapered off and gabapentin  increased, so will start her on campral. Also she's already taking gabapentin  TID.  # AUD, severe # Epilepsy, intractable, unspecified # Expressive aphasia CIWA with ativan  PRN per protocol with thiamine  & MV supplement Continued librium  taper (ends 5/4) Continued VPA taper Continued home gabapentin  300 mg TID Continued home folic acid  daily Continued home melatonin 5 mg qPM Continued PRN remeron 7.5 mg qPM STARTED campral 666 mg TID  # Nicotine  use d/o NRTs Encouraged cessation   # Cocaine use d/o Encouraged cessation Comfort PRNs   # VitD Deficiency Continued VitD 400u supplement daily  # Elevated ammonium STARTED lactulose 20 g BID  PRNs: acetaminophen , 650 mg, Q6H PRN alum & mag hydroxide-simeth, 30 mL, Q4H PRN haloperidol , 5 mg, TID PRN  And diphenhydrAMINE , 50 mg, TID PRN haloperidol  lactate, 5 mg, TID PRN  And diphenhydrAMINE , 50 mg, TID PRN  And LORazepam , 2 mg, TID PRN haloperidol  lactate, 10 mg, TID PRN  And diphenhydrAMINE , 50 mg, TID PRN  And LORazepam , 2 mg, TID  PRN hydrOXYzine , 25 mg, Q6H PRN loperamide , 2-4 mg, PRN magnesium  hydroxide, 30 mL, Daily PRN mirtazapine, 7.5 mg, QHS PRN ondansetron , 4 mg, Q6H PRN     Clinical Course as of 01/11/24 0817  Sun Jan 11, 2024  0815 Ammonia(!): 57 [JN]  0816 Vitamin B12: 507 [JN]  0816 Vitamin D, 25-Hydroxy(!): 26.24 [JN]  0816 EKG 12-Lead QT/QTcB 470/499 ms [JN]    Clinical Course User Index [JN] Georges Kings, DO    Abnormal Labs Reviewed  AMMONIA - Abnormal; Notable for the following components:      Result Value   Ammonia 57 (*)    All other components within normal limits  VITAMIN D 25 HYDROXY (VIT D DEFICIENCY, FRACTURES) - Abnormal; Notable for the following components:   Vit D, 25-Hydroxy 26.24 (*)    All other components within normal limits     Georges Kings, DO Psych Resident, PGY-3 01/10/2024 8:26 PM

## 2024-01-10 NOTE — Discharge Instructions (Addendum)
 Referral was received and per Moira Andrews, patient has been accepted and can transfer to the facility on 01/14/24 by 9:00am. Update has been provided to the patient and MD made aware. Patient will need a 7-14 day supply of medication and one month refill. No nicotine  gum allowed, however 14-30 day nicotine  patches to be provided if needed. No other needs to report at this time.   Patient will be discharging to [facility] and will need cab to arrive at 8:00 am with transportation provided via Taxi. Address is [address] [place], [place] [address]. Number to call for emergency is [phone]   Liam Redhead, LCSW Clinical Social Worker [place] Ph: [phone]

## 2024-01-10 NOTE — Group Note (Signed)
 Group Topic: Feelings about Diagnosis  Group Date: 01/10/2024 Start Time: 0900 End Time: 1000 Facilitators: Journii Nierman, [place], RN  Department: [facility]  Number of Participants: 6  Group Focus: substance abuse education Treatment Modality:  Individual Therapy Interventions utilized were patient education Purpose: reasons for being medication adherent  Name: Theresa Rios Date of Birth: June 09, 1966  MR: [id number]    Level of Participation: moderate Quality of Participation: cooperative Interactions with others: gave feedback Mood/Affect: appropriate and positive Triggers (if applicable): na Cognition: goal directed Progress: Gaining insight Response: ' I am grateful Plan: follow-up needed  Patients Problems:  Patient Active Problem List   Diagnosis Date Noted   Low vitamin D level 01/10/2024   Epilepsy, unspecified, intractable, without status epilepticus (HCC) 01/10/2024   Nicotine  use disorder 01/10/2024   Cannabis use disorder 01/10/2024   Increased ammonia level 01/10/2024   QT prolongation 01/09/2024   Cocaine use disorder, severe, dependence (HCC) 01/21/2023   Non compliance with medical treatment 01/21/2023   Housing instability 09/06/2022   MDD (major depressive disorder), recurrent, severe, with psychosis (HCC) 08/02/2022   MDD (major depressive disorder), recurrent episode, severe (HCC) 05/10/2022   Seizure (HCC) 04/11/2022   MDD (major depressive episode), single episode, severe, no psychosis (HCC) 03/21/2022   Alcohol use disorder 03/21/2022   Anxiety state 03/21/2022   Insomnia 03/21/2022   MDD (major depressive disorder), recurrent severe, without psychosis (HCC) 03/21/2022

## 2024-01-10 NOTE — ED Notes (Signed)
 Patient asleep at change of shift. Currently patient is in the dayroom clam and cooperative, pt is however very needy and intrusive becoming tearful when discussing how her needs can be met. "I'm overwhelmed" she stated. pt is here for ETOH/cocaine abuse. denies si/hi/avh  at this time.  Medication education provided during med pass with pt concerns offered no concerns. Pt adheres to schedule medication. Visible at intervals on unit and keeps behavior controlled. Will continue to monitor and report changes as noted

## 2024-01-10 NOTE — ED Notes (Signed)
 Pt sleeping at present, no distress noted, monitoring for safety.

## 2024-01-11 ENCOUNTER — Encounter (HOSPITAL_COMMUNITY): Payer: Self-pay | Admitting: Psychiatry

## 2024-01-11 DIAGNOSIS — K7682 Hepatic encephalopathy: Secondary | ICD-10-CM

## 2024-01-11 DIAGNOSIS — E559 Vitamin D deficiency, unspecified: Secondary | ICD-10-CM | POA: Diagnosis not present

## 2024-01-11 DIAGNOSIS — G40919 Epilepsy, unspecified, intractable, without status epilepticus: Secondary | ICD-10-CM | POA: Diagnosis not present

## 2024-01-11 DIAGNOSIS — F1010 Alcohol abuse, uncomplicated: Secondary | ICD-10-CM | POA: Diagnosis not present

## 2024-01-11 DIAGNOSIS — F1420 Cocaine dependence, uncomplicated: Secondary | ICD-10-CM | POA: Diagnosis not present

## 2024-01-11 HISTORY — DX: Hepatic encephalopathy: K76.82

## 2024-01-11 MED ORDER — LACTULOSE 10 GM/15ML PO SOLN: 20.0000 g | Freq: Two times a day (BID) | ORAL | Status: DC

## 2024-01-11 MED ORDER — LACTULOSE 10 GM/15ML PO SOLN
20.0000 g | Freq: Two times a day (BID) | ORAL | Status: DC
Start: 2024-01-11 — End: 2024-01-14
  Administered 2024-01-11 – 2024-01-13 (×4): 20 g via ORAL
  Filled 2024-01-11 (×3): qty 30
  Filled 2024-01-11: qty 420
  Filled 2024-01-11 (×2): qty 30
  Filled 2024-01-11: qty 840

## 2024-01-11 NOTE — ED Notes (Signed)
 Patient was provided lunch

## 2024-01-11 NOTE — Group Note (Signed)
 Group Topic: Social Support  Group Date: 01/11/2024 Start Time: 1930 End Time: 2000 Facilitators: Wendall Halls B  Department: [facility]  Number of Participants: 5  Group Focus: abuse issues, acceptance, activities of daily living skills, check in, coping skills, daily focus, goals/reality orientation, personal responsibility, problem solving, self-esteem, and social skills Treatment Modality:  Individual Therapy Interventions utilized were leisure development and support Purpose: enhance coping skills, express feelings, increase insight, and relapse prevention strategies  Name: Theresa Rios Date of Birth: Apr 08, 1966  MR: [id number]    Level of Participation: active Quality of Participation: cooperative Interactions with others: gave feedback Mood/Affect: appropriate Triggers (if applicable): NA Cognition: coherent/clear Progress: Gaining insight Response: NA Plan: patient will be encouraged to keep going to groups.   Patients Problems:  Patient Active Problem List   Diagnosis Date Noted   Hepatic encephalopathy (HCC) 01/11/2024   Low vitamin D level 01/10/2024   Epilepsy, unspecified, intractable, without status epilepticus (HCC) 01/10/2024   Nicotine  use disorder 01/10/2024   Cannabis use disorder 01/10/2024   Increased ammonia level 01/10/2024   QT prolongation 01/09/2024   Cocaine use disorder, severe, dependence (HCC) 01/21/2023   Non compliance with medical treatment 01/21/2023   Housing instability 09/06/2022   MDD (major depressive disorder), recurrent, severe, with psychosis (HCC) 08/02/2022   MDD (major depressive disorder), recurrent episode, severe (HCC) 05/10/2022   Seizure (HCC) 04/11/2022   MDD (major depressive episode), single episode, severe, no psychosis (HCC) 03/21/2022   Alcohol use disorder 03/21/2022   Insomnia 03/21/2022   MDD (major depressive disorder), recurrent severe, without psychosis (HCC) 03/21/2022

## 2024-01-11 NOTE — ED Provider Notes (Signed)
 Behavioral Health Progress Note  Date and Time: 01/11/2024 3:02 PM Name: Theresa Rios MRN:  [id number] CC: detox   Subjective:  Theresa Rios is a 58 y.o. 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 No issues with naming or repeating. Pleasant and engaged with eval.  Mood: ("good"), overall much better than yesterday. She feels that she can get her words out. Did comment on how she doesn't like the lactulose because of the abdominal cramping and BM. Discussed the need for it since she is articulate now.  Sleep:Poor (because of the lactulose) Appetite: good SI:No HI:No NGE:XBMW  Still wants to go to rehab.   Review of Systems  Constitutional:  Negative for malaise/fatigue.  Respiratory:  Negative for shortness of breath.   Cardiovascular:  Negative for chest pain.  Gastrointestinal:  Positive for abdominal pain and diarrhea. Negative for blood in stool, nausea and vomiting.  Neurological:  Negative for dizziness, tremors and headaches.    Diagnosis:  Final diagnoses:  Alcohol use disorder  Cocaine use disorder, severe, dependence (HCC)  Cannabis use disorder  Nicotine  use disorder  Intractable epilepsy without status epilepticus, unspecified epilepsy type (HCC)  QT prolongation  Low vitamin D level    Past Psychiatric History: See H&P Past Medical History: See H&P Family History: See H&P Family Psychiatric  History: See H&P Social History: See H&P  Total Time spent with patient: 30 minutes  Additional Social History:                         Current Medications:  Current Facility-Administered Medications  Medication Dose Route Frequency Provider  Last Rate Last Admin   acamprosate (CAMPRAL) tablet 666 mg  666 mg Oral TID with meals Lexii Walsh, DO   666 mg at 01/11/24 1140   acetaminophen  (TYLENOL ) tablet 650 mg  650 mg Oral Q6H PRN Brent, Amanda C, NP   650 mg at 01/11/24 0930   alum & mag hydroxide-simeth (MAALOX/MYLANTA) 200-200-20 MG/5ML suspension 30 mL  30 mL Oral Q4H PRN Brent, Amanda C, NP       cholecalciferol (VITAMIN D3) 10 MCG (400 UNIT) tablet 400 Units  400 Units Oral Daily Floyce Hutching, MD   400 Units at 01/11/24 0930   haloperidol  (HALDOL ) tablet 5 mg  5 mg Oral TID PRN Brent, Amanda C, NP       And   diphenhydrAMINE  (BENADRYL ) capsule 50 mg  50 mg Oral TID PRN Brent, Amanda C, NP       haloperidol  lactate (HALDOL ) injection 5 mg  5 mg Intramuscular TID PRN Brent, Amanda C, NP       And   diphenhydrAMINE  (BENADRYL ) injection 50 mg  50 mg Intramuscular TID PRN Brent, Amanda C, NP       And   LORazepam  (ATIVAN ) injection 2 mg  2 mg Intramuscular TID PRN Brent, Amanda C, NP       haloperidol  lactate (HALDOL ) injection 10 mg  10 mg Intramuscular TID PRN Davia Erps, NP       And   diphenhydrAMINE  (BENADRYL ) injection 50 mg  50 mg Intramuscular TID PRN Brent, Amanda C, NP  And   LORazepam  (ATIVAN ) injection 2 mg  2 mg Intramuscular TID PRN Brent, Amanda C, NP       folic acid  (FOLVITE ) tablet 1 mg  1 mg Oral Daily Brent, Amanda C, NP   1 mg at 01/11/24 0930   gabapentin  (NEURONTIN ) capsule 300 mg  300 mg Oral TID Floyce Hutching, MD   300 mg at 01/11/24 0930   hydrOXYzine  (ATARAX ) tablet 25 mg  25 mg Oral Q6H PRN Brent, Amanda C, NP   25 mg at 01/08/24 2117   lactulose (CHRONULAC) 10 GM/15ML solution 20 g  20 g Oral BID Romelia Bromell, DO       loperamide  (IMODIUM ) capsule 2-4 mg  2-4 mg Oral PRN Brent, Amanda C, NP       magnesium  hydroxide (MILK OF MAGNESIA) suspension 30 mL  30 mL Oral Daily PRN Brent, Amanda C, NP       melatonin tablet 5 mg  5 mg Oral QHS Hill, Alonza Arthurs, MD   5 mg at  01/10/24 2100   mirtazapine (REMERON) tablet 7.5 mg  7.5 mg Oral QHS PRN Floyce Hutching, MD       multivitamin with minerals tablet 1 tablet  1 tablet Oral Daily Brent, Amanda C, NP   1 tablet at 01/11/24 0930   nicotine  (NICODERM CQ  - dosed in mg/24 hours) patch 14 mg  14 mg Transdermal Daily Hill, Alonza Arthurs, MD   14 mg at 01/11/24 0930   ondansetron  (ZOFRAN -ODT) disintegrating tablet 4 mg  4 mg Oral Q6H PRN Brent, Amanda C, NP       thiamine  (VITAMIN B1) injection 100 mg  100 mg Intramuscular Once Brent, Amanda C, NP       thiamine  (VITAMIN B1) tablet 100 mg  100 mg Oral QHS Lahna Nath, DO       Current Outpatient Medications  Medication Sig Dispense Refill   divalproex  (DEPAKOTE ) 500 MG DR tablet Take 1 tablet (500 mg total) by mouth every 12 (twelve) hours. 180 tablet 3   folic acid  (FOLVITE ) 1 MG tablet Take 1 tablet (1 mg total) by mouth daily. 90 tablet 3   gabapentin  (NEURONTIN ) 300 MG capsule Take 1 capsule (300 mg total) by mouth at bedtime. 90 capsule 3   thiamine  (VITAMIN B1) 100 MG tablet Take 1 tablet (100 mg total) by mouth daily. 90 tablet 3   Labs  Lab Results:  Admission on 01/08/2024  Component Date Value Ref Range Status   Ammonia 01/10/2024 57 (H)  9 - 35 umol/L Final   Performed at [facility], [address], [place], [place] [address]   Vitamin B-12 01/09/2024 507  180 - 914 pg/mL Final   Comment: (NOTE) This assay is not validated for testing neonatal or myeloproliferative syndrome specimens for Vitamin B12 levels. Performed at [facility], [address], [place], [place] [address]    Vit D, 25-Hydroxy 01/09/2024 26.24 (L)  30 - 100 ng/mL Final   Comment: (NOTE) Vitamin D deficiency has been defined by the Institute of Medicine  and an Endocrine Society practice guideline as a level of serum 25-OH  vitamin D less than 20 ng/mL (1,2). The Endocrine Society went on to  further define vitamin D insufficiency as a level between 21  and 29  ng/mL (2).  1. IOM (Institute of Medicine). 2010. Dietary reference intakes for  calcium and D. [place]: [employer]. 2. Holick MF, Binkley [place],  Bischoff-Ferrari HA, et al. Evaluation,  treatment, and prevention of vitamin D deficiency: an Endocrine  Society clinical practice guideline, JCEM. 2011 Jul; 96(7): 1911-30.  Performed at [facility], [address] [place], [place] [address]   Admission on 01/08/2024, Discharged on 01/08/2024  Component Date Value Ref Range Status   WBC 01/08/2024 10.1  4.0 - 10.5 K/uL Final   RBC 01/08/2024 4.97  3.87 - 5.11 MIL/uL Final   Hemoglobin 01/08/2024 14.4  12.0 - 15.0 g/dL Final   HCT 09/11/7251 44.6  36.0 - 46.0 % Final   MCV 01/08/2024 89.7  80.0 - 100.0 fL Final   MCH 01/08/2024 29.0  26.0 - 34.0 pg Final   MCHC 01/08/2024 32.3  30.0 - 36.0 g/dL Final   RDW 66/44/0347 14.3  11.5 - 15.5 % Final   Platelets 01/08/2024 262  150 - 400 K/uL Final   nRBC 01/08/2024 0.0  0.0 - 0.2 % Final   Neutrophils Relative % 01/08/2024 53  % Final   Neutro Abs 01/08/2024 5.3  1.7 - 7.7 K/uL Final   Lymphocytes Relative 01/08/2024 32  % Final   Lymphs Abs 01/08/2024 3.2  0.7 - 4.0 K/uL Final   Monocytes Relative 01/08/2024 14  % Final   Monocytes Absolute 01/08/2024 1.4 (H)  0.1 - 1.0 K/uL Final   Eosinophils Relative 01/08/2024 1  % Final   Eosinophils Absolute 01/08/2024 0.1  0.0 - 0.5 K/uL Final   Basophils Relative 01/08/2024 0  % Final   Basophils Absolute 01/08/2024 0.0  0.0 - 0.1 K/uL Final   Immature Granulocytes 01/08/2024 0  % Final   Abs Immature Granulocytes 01/08/2024 0.03  0.00 - 0.07 K/uL Final   Performed at [facility], [address] [place] [address], [place], [place] [address]   Sodium 01/08/2024 139  135 - 145 mmol/L Final   Potassium 01/08/2024 4.7  3.5 - 5.1 mmol/L Final   Chloride 01/08/2024 101  98 - 111 mmol/L Final   CO2 01/08/2024 27  22 - 32 mmol/L Final   Glucose, Bld 01/08/2024 78  70 - 99 mg/dL  Final   Glucose reference range applies only to samples taken after fasting for at least 8 hours.   BUN 01/08/2024 9  6 - 20 mg/dL Final   Creatinine, Ser 01/08/2024 0.71  0.44 - 1.00 mg/dL Final   Calcium 63/87/5643 9.6  8.9 - 10.3 mg/dL Final   Total Protein 32/95/1884 6.7  6.5 - 8.1 g/dL Final   Albumin 16/60/6301 3.6  3.5 - 5.0 g/dL Final   AST 60/06/9322 25  15 - 41 U/L Final   ALT 01/08/2024 22  0 - 44 U/L Final   Alkaline Phosphatase 01/08/2024 57  38 - 126 U/L Final   Total Bilirubin 01/08/2024 0.6  0.0 - 1.2 mg/dL Final   GFR, Estimated 01/08/2024 >60  >60 mL/min Final   Comment: (NOTE) Calculated using the CKD-EPI Creatinine Equation (2021)    Anion gap 01/08/2024 11  5 - 15 Final   Performed at [facility], [address], [place], [place] [address]   Hgb A1c MFr Bld 01/08/2024 5.5  4.8 - 5.6 % Final   Comment: (NOTE) Pre diabetes:          5.7%-6.4%  Diabetes:              >6.4%  Glycemic control for   <7.0% adults with diabetes    Mean Plasma Glucose 01/08/2024 111.15  mg/dL Final  Performed at [facility], [address] [place], [place] [address]   Magnesium  01/08/2024 2.4  1.7 - 2.4 mg/dL Final   Performed at [facility], [address], [place], [place] [address]   Alcohol, Ethyl (B) 01/08/2024 <15  <15 mg/dL Final   Comment: Please note change in reference range. (NOTE) For medical purposes only. Performed at [facility], [address] [place], [place] [address]    Cholesterol 01/08/2024 214 (H)  0 - 200 mg/dL Final   Triglycerides 91/47/8295 120  <150 mg/dL Final   HDL 62/13/0865 99  >40 mg/dL Final   Total CHOL/HDL Ratio 01/08/2024 2.2  RATIO Final   VLDL 01/08/2024 24  0 - 40 mg/dL Final   LDL Cholesterol 01/08/2024 91  0 - 99 mg/dL Final   Comment:        Total Cholesterol/HDL:CHD Risk Coronary Heart Disease Risk Table                     Men   Women  1/2 Average Risk   3.4   3.3  Average Risk       5.0   4.4  2 X  Average Risk   9.6   7.1  3 X Average Risk  23.4   11.0        Use the calculated Patient Ratio above and the CHD Risk Table to determine the patient's CHD Risk.        ATP III CLASSIFICATION (LDL):  <100     mg/dL   Optimal  784-696  mg/dL   Near or Above                    Optimal  130-159  mg/dL   Borderline  295-284  mg/dL   High  >132     mg/dL   Very High Performed at [facility], [address] [place], [place] [address]    TSH 01/08/2024 1.077  0.350 - 4.500 uIU/mL Final   Comment: Performed by a 3rd Generation assay with a functional sensitivity of <=0.01 uIU/mL. Performed at [facility], [address], [place], [place] [address]    POC Amphetamine UR 01/08/2024 None Detected  NONE DETECTED (Cut Off Level 1000 ng/mL) Final   POC Secobarbital (BAR) 01/08/2024 None Detected  NONE DETECTED (Cut Off Level 300 ng/mL) Final   POC Buprenorphine (BUP) 01/08/2024 None Detected  NONE DETECTED (Cut Off Level 10 ng/mL) Final   POC Oxazepam (BZO) 01/08/2024 None Detected  NONE DETECTED (Cut Off Level 300 ng/mL) Final   POC Cocaine UR 01/08/2024 Positive (A)  NONE DETECTED (Cut Off Level 300 ng/mL) Final   POC Methamphetamine UR 01/08/2024 None Detected  NONE DETECTED (Cut Off Level 1000 ng/mL) Final   POC Morphine  01/08/2024 None Detected  NONE DETECTED (Cut Off Level 300 ng/mL) Final   POC Methadone UR 01/08/2024 None Detected  NONE DETECTED (Cut Off Level 300 ng/mL) Final   POC Oxycodone UR 01/08/2024 None Detected  NONE DETECTED (Cut Off Level 100 ng/mL) Final   POC Marijuana UR 01/08/2024 Positive (A)  NONE DETECTED (Cut Off Level 50 ng/mL) Final   Valproic Acid  Lvl 01/08/2024 35 (L)  50 - 100 ug/mL Final   Performed at [facility], [address] [place], [place] [address]  Admission on 12/03/2023, Discharged on 12/04/2023  Component Date Value Ref Range Status   WBC 12/03/2023 10.1  4.0 - 10.5 K/uL Final   RBC 12/03/2023 5.15 (H)  3.87 - 5.11 MIL/uL Final    Hemoglobin 12/03/2023 14.7  12.0 - 15.0 g/dL Final   HCT 16/06/9603 46.0  36.0 - 46.0 % Final   MCV 12/03/2023 89.3  80.0 - 100.0 fL Final   MCH 12/03/2023 28.5  26.0 - 34.0 pg Final   MCHC 12/03/2023 32.0  30.0 - 36.0 g/dL Final   RDW 54/05/8118 14.6  11.5 - 15.5 % Final   Platelets 12/03/2023 210  150 - 400 K/uL Final   REPEATED TO VERIFY   nRBC 12/03/2023 0.0  0.0 - 0.2 % Final   Neutrophils Relative % 12/03/2023 72  % Final   Neutro Abs 12/03/2023 7.3  1.7 - 7.7 K/uL Final   Lymphocytes Relative 12/03/2023 19  % Final   Lymphs Abs 12/03/2023 1.9  0.7 - 4.0 K/uL Final   Monocytes Relative 12/03/2023 8  % Final   Monocytes Absolute 12/03/2023 0.8  0.1 - 1.0 K/uL Final   Eosinophils Relative 12/03/2023 1  % Final   Eosinophils Absolute 12/03/2023 0.1  0.0 - 0.5 K/uL Final   Basophils Relative 12/03/2023 0  % Final   Basophils Absolute 12/03/2023 0.0  0.0 - 0.1 K/uL Final   Immature Granulocytes 12/03/2023 0  % Final   Abs Immature Granulocytes 12/03/2023 0.03  0.00 - 0.07 K/uL Final   Performed at [facility], [address], [place], [place] [address]   Color, Urine 12/04/2023 YELLOW  YELLOW Final   APPearance 12/04/2023 CLEAR  CLEAR Final   Specific Gravity, Urine 12/04/2023 1.011  1.005 - 1.030 Final   pH 12/04/2023 6.0  5.0 - 8.0 Final   Glucose, UA 12/04/2023 NEGATIVE  NEGATIVE mg/dL Final   Hgb urine dipstick 12/04/2023 NEGATIVE  NEGATIVE Final   Bilirubin Urine 12/04/2023 NEGATIVE  NEGATIVE Final   Ketones, ur 12/04/2023 NEGATIVE  NEGATIVE mg/dL Final   Protein, ur 95/62/1308 NEGATIVE  NEGATIVE mg/dL Final   Nitrite 65/78/4696 NEGATIVE  NEGATIVE Final   Leukocytes,Ua 12/04/2023 NEGATIVE  NEGATIVE Final   Performed at [facility], [address] [place], [place] [address]   Valproic Acid  Lvl 12/03/2023 <10 (L)  50.0 - 100.0 ug/mL Final   Comment: RESULT CONFIRMED BY MANUAL DILUTION Performed at [facility], [address] [place], [place]  [address]    Glucose-Capillary 12/03/2023 143 (H)  70 - 99 mg/dL Final   Glucose reference range applies only to samples taken after fasting for at least 8 hours.   Sodium 12/04/2023 141  135 - 145 mmol/L Final   Potassium 12/04/2023 4.2  3.5 - 5.1 mmol/L Final   HEMOLYSIS AT THIS LEVEL MAY AFFECT RESULT   Chloride 12/04/2023 108  98 - 111 mmol/L Final   CO2 12/04/2023 24  22 - 32 mmol/L Final   Glucose, Bld 12/04/2023 118 (H)  70 - 99 mg/dL Final   Glucose reference range applies only to samples taken after fasting for at least 8 hours.   BUN 12/04/2023 5 (L)  6 - 20 mg/dL Final   Creatinine, Ser 12/04/2023 0.74  0.44 - 1.00 mg/dL Final   Calcium 40/06/2724 9.1  8.9 - 10.3 mg/dL Final   Total Protein 36/64/4034 6.4 (L)  6.5 - 8.1 g/dL Final   Albumin 74/25/9563 3.3 (L)  3.5 - 5.0 g/dL Final   AST 87/56/4332 26  15 - 41 U/L Final   HEMOLYSIS AT THIS LEVEL MAY  AFFECT RESULT   ALT 12/04/2023 17  0 - 44 U/L Final   HEMOLYSIS AT THIS LEVEL MAY AFFECT RESULT   Alkaline Phosphatase 12/04/2023 67  38 - 126 U/L Final   Total Bilirubin 12/04/2023 0.9  0.0 - 1.2 mg/dL Final   HEMOLYSIS AT THIS LEVEL MAY AFFECT RESULT   GFR, Estimated 12/04/2023 >60  >60 mL/min Final   Comment: (NOTE) Calculated using the CKD-EPI Creatinine Equation (2021)    Anion gap 12/04/2023 9  5 - 15 Final   Performed at [facility], [address], [place], [place] [address]   Magnesium  12/04/2023 2.1  1.7 - 2.4 mg/dL Final   Performed at [facility], [address], [place], [place] [address]  Office Visit on 10/28/2023  Component Date Value Ref Range Status   Valproic Acid  Lvl 10/28/2023 47 (L)  50 - 100 ug/mL Final   Comment:                                 Detection Limit = 4                            <4 indicates None Detected Toxicity may occur at levels of 100-500. Measurements of free unbound valproic acid  may improve the assess- ment of clinical response.    Glucose 10/28/2023 105 (H)  70 - 99  mg/dL Final   BUN 14/78/2956 5 (L)  6 - 24 mg/dL Final   Creatinine, Ser 10/28/2023 0.66  0.57 - 1.00 mg/dL Final   eGFR 21/30/8657 102  >59 mL/min/1.73 Final   BUN/Creatinine Ratio 10/28/2023 8 (L)  9 - 23 Final   Sodium 10/28/2023 143  134 - 144 mmol/L Final   Potassium 10/28/2023 4.0  3.5 - 5.2 mmol/L Final   Chloride 10/28/2023 101  96 - 106 mmol/L Final   CO2 10/28/2023 21  20 - 29 mmol/L Final   Calcium 10/28/2023 9.8  8.7 - 10.2 mg/dL Final   Total Protein 84/69/6295 7.1  6.0 - 8.5 g/dL Final   Albumin 28/41/3244 4.3  3.8 - 4.9 g/dL Final   Globulin, Total 10/28/2023 2.8  1.5 - 4.5 g/dL Final   Bilirubin Total 10/28/2023 <0.2  0.0 - 1.2 mg/dL Final   Alkaline Phosphatase 10/28/2023 99  44 - 121 IU/L Final   AST 10/28/2023 19  0 - 40 IU/L Final   ALT 10/28/2023 14  0 - 32 IU/L Final  Admission on 08/30/2023, Discharged on 08/31/2023  Component Date Value Ref Range Status   WBC 08/30/2023 15.0 (H)  4.0 - 10.5 K/uL Final   RBC 08/30/2023 5.47 (H)  3.87 - 5.11 MIL/uL Final   Hemoglobin 08/30/2023 15.3 (H)  12.0 - 15.0 g/dL Final   HCT 09/11/7251 48.8 (H)  36.0 - 46.0 % Final   MCV 08/30/2023 89.2  80.0 - 100.0 fL Final   MCH 08/30/2023 28.0  26.0 - 34.0 pg Final   MCHC 08/30/2023 31.4  30.0 - 36.0 g/dL Final   RDW 66/44/0347 14.8  11.5 - 15.5 % Final   Platelets 08/30/2023 240  150 - 400 K/uL Final   nRBC 08/30/2023 0.0  0.0 - 0.2 % Final   Neutrophils Relative % 08/30/2023 71  % Final   Neutro Abs 08/30/2023 10.7 (H)  1.7 - 7.7 K/uL Final   Lymphocytes Relative 08/30/2023 20  %  Final   Lymphs Abs 08/30/2023 3.0  0.7 - 4.0 K/uL Final   Monocytes Relative 08/30/2023 8  % Final   Monocytes Absolute 08/30/2023 1.2 (H)  0.1 - 1.0 K/uL Final   Eosinophils Relative 08/30/2023 0  % Final   Eosinophils Absolute 08/30/2023 0.1  0.0 - 0.5 K/uL Final   Basophils Relative 08/30/2023 0  % Final   Basophils Absolute 08/30/2023 0.0  0.0 - 0.1 K/uL Final   Immature Granulocytes 08/30/2023 1   % Final   Abs Immature Granulocytes 08/30/2023 0.07  0.00 - 0.07 K/uL Final   Performed at [facility], [address], [place], [place] [address]   Sodium 08/30/2023 135  135 - 145 mmol/L Final   Potassium 08/30/2023 3.3 (L)  3.5 - 5.1 mmol/L Final   Chloride 08/30/2023 98  98 - 111 mmol/L Final   CO2 08/30/2023 19 (L)  22 - 32 mmol/L Final   Glucose, Bld 08/30/2023 150 (H)  70 - 99 mg/dL Final   Glucose reference range applies only to samples taken after fasting for at least 8 hours.   BUN 08/30/2023 8  6 - 20 mg/dL Final   Creatinine, Ser 08/30/2023 0.82  0.44 - 1.00 mg/dL Final   Calcium 69/62/9528 8.9  8.9 - 10.3 mg/dL Final   Total Protein 41/32/4401 8.8 (H)  6.5 - 8.1 g/dL Final   Albumin 02/72/5366 4.5  3.5 - 5.0 g/dL Final   AST 44/11/4740 35  15 - 41 U/L Final   ALT 08/30/2023 22  0 - 44 U/L Final   Alkaline Phosphatase 08/30/2023 87  38 - 126 U/L Final   Total Bilirubin 08/30/2023 0.6  <1.2 mg/dL Final   GFR, Estimated 08/30/2023 >60  >60 mL/min Final   Comment: (NOTE) Calculated using the CKD-EPI Creatinine Equation (2021)    Anion gap 08/30/2023 18 (H)  5 - 15 Final   Performed at [facility], [address] [place], [place] [address]   Alcohol, Ethyl (B) 08/30/2023 <10  <10 mg/dL Final   Comment: (NOTE) Lowest detectable limit for serum alcohol is 10 mg/dL.  For medical purposes only. Performed at [facility], [address], [place], [place] [address]    Opiates 08/30/2023 NONE DETECTED  NONE DETECTED Final   Cocaine 08/30/2023 POSITIVE (A)  NONE DETECTED Final   Benzodiazepines 08/30/2023 NONE DETECTED  NONE DETECTED Final   Amphetamines 08/30/2023 NONE DETECTED  NONE DETECTED Final   Tetrahydrocannabinol 08/30/2023 NONE DETECTED  NONE DETECTED Final   Barbiturates 08/30/2023 NONE DETECTED  NONE DETECTED Final   Comment: (NOTE) DRUG SCREEN FOR MEDICAL PURPOSES ONLY.  IF CONFIRMATION IS NEEDED FOR  ANY PURPOSE, NOTIFY LAB WITHIN 5 DAYS.  LOWEST DETECTABLE LIMITS FOR URINE DRUG SCREEN Drug Class                     Cutoff (ng/mL) Amphetamine and metabolites    1000 Barbiturate and metabolites    200 Benzodiazepine                 200 Opiates and metabolites        300 Cocaine and metabolites        300 THC                            50 Performed at [facility], [address], [place], [place] [address]  HIV Screen 4th Generation wRfx 08/31/2023 Non Reactive  Non Reactive Final   Performed at [facility], [address] [place], [place] [address]   Sodium 08/31/2023 136  135 - 145 mmol/L Final   Potassium 08/31/2023 3.1 (L)  3.5 - 5.1 mmol/L Final   Chloride 08/31/2023 102  98 - 111 mmol/L Final   CO2 08/31/2023 24  22 - 32 mmol/L Final   Glucose, Bld 08/31/2023 97  70 - 99 mg/dL Final   Glucose reference range applies only to samples taken after fasting for at least 8 hours.   BUN 08/31/2023 8  6 - 20 mg/dL Final   Creatinine, Ser 08/31/2023 0.52  0.44 - 1.00 mg/dL Final   Calcium 60/45/4098 9.4  8.9 - 10.3 mg/dL Final   Total Protein 11/91/4782 7.6  6.5 - 8.1 g/dL Final   Albumin 95/62/1308 3.8  3.5 - 5.0 g/dL Final   AST 65/78/4696 17  15 - 41 U/L Final   ALT 08/31/2023 15  0 - 44 U/L Final   Alkaline Phosphatase 08/31/2023 65  38 - 126 U/L Final   Total Bilirubin 08/31/2023 1.3 (H)  <1.2 mg/dL Final   GFR, Estimated 08/31/2023 >60  >60 mL/min Final   Comment: (NOTE) Calculated using the CKD-EPI Creatinine Equation (2021)    Anion gap 08/31/2023 10  5 - 15 Final   Performed at [facility], [address], [place], [place] [address]   WBC 08/31/2023 12.5 (H)  4.0 - 10.5 K/uL Final   RBC 08/31/2023 5.17 (H)  3.87 - 5.11 MIL/uL Final   Hemoglobin 08/31/2023 14.2  12.0 - 15.0 g/dL Final   HCT 41/32/4401 44.3  36.0 - 46.0 % Final   MCV 08/31/2023 85.7  80.0 - 100.0 fL Final   MCH 08/31/2023 27.5  26.0 - 34.0 pg Final    MCHC 08/31/2023 32.1  30.0 - 36.0 g/dL Final   RDW 02/72/5366 14.6  11.5 - 15.5 % Final   Platelets 08/31/2023 193  150 - 400 K/uL Final   nRBC 08/31/2023 0.0  0.0 - 0.2 % Final   Performed at [facility], [address] [place], [place] [address]   Magnesium  08/31/2023 2.5 (H)  1.7 - 2.4 mg/dL Final   Performed at [facility], [address], [place], [place] [address]    Blood Alcohol level:  Lab Results  Component Value Date   [facility] <15 01/08/2024   ETH <10 08/30/2023    Metabolic Disorder Labs: Lab Results  Component Value Date   HGBA1C 5.5 01/08/2024   MPG 111.15 01/08/2024   MPG 114 08/03/2022   No results found for: "PROLACTIN" Lab Results  Component Value Date   CHOL 214 (H) 01/08/2024   TRIG 120 01/08/2024   HDL 99 01/08/2024   CHOLHDL 2.2 01/08/2024   VLDL 24 01/08/2024   LDLCALC 91 01/08/2024   LDLCALC 130 (H) 09/01/2022    Therapeutic Lab Levels: No results found for: "LITHIUM" Lab Results  Component Value Date   VALPROATE 35 (L) 01/08/2024   VALPROATE <10 (L) 12/03/2023   No results found for: "CBMZ"  Physical Findings   AIMS    Flowsheet Row [facility]  AIMS Total Score 0      AUDIT    Flowsheet Row ED from 01/08/2024 in [facility] Admission (Discharged) from [facility] (Discharged[facility] Admission (Discharged) from [facility] (Discharged[facility]  Alcohol Use Disorder Identification Test Final Score (AUDIT) 40 11 11 12 11       CAGE-AID    Flowsheet Row ED to Hosp-Admission (Discharged) from 11/27/2022 in [facility] ED to  Hosp-Admission (Discharged) from 04/11/2022 in [facility]  CAGE-AID Score 1 2      GAD-7    Flowsheet Row Office Visit from 04/22/2023 in [facility]  Total GAD-7 Score 21      PHQ2-9    Flowsheet Row Office Visit from 04/22/2023 in [facility] Office Visit from 04/03/2022 in [facility]  PHQ-2 Total Score 6 0  PHQ-9 Total Score 27 --      Flowsheet Row ED from 01/08/2024 in [facility] Most recent reading at 01/08/2024  4:39 PM ED from 01/08/2024 in [facility] Most recent reading at 01/08/2024 10:49 AM ED from 12/03/2023 in [facility] Emergency Department at [facility] Most recent reading at 12/03/2023 11:25 PM  C-SSRS RISK CATEGORY No Risk No Risk No Risk        Musculoskeletal  Strength & Muscle Tone: within normal limited Gait & Station: normal Patient leans: NA   Psychiatric Specialty Exam  Presentation General Appearance:Appropriate for Environment, Casual, Fairly Groomed Eye Contact:Good Speech:Clear and Coherent, Normal Rate (spontaneous) Volume:Normal Handedness:Right  Mood and Affect  Mood: ("good") Affect:Appropriate, Congruent, Full Range (anxious appearing at times)  Thought Process  Thought Process:Coherent, Goal Directed, Linear Descriptions of Associations:Intact  Thought Content Suicidal Thoughts:No Homicidal Thoughts:No Hallucinations:None Ideas of Reference:None Thought Content:Logical, Rumination  Sensorium  Memory:Immediate Good Judgment:Good Insight:Fair  Executive Functions  Orientation:Full (Time, Place and Person) Language:Good Concentration:Good Attention:Good Recall:Good Fund of Knowledge:Good  Psychomotor Activity  Psychomotor Activity:Psychomotor Activity: Normal  Assets  Assets:Communication Skills, Desire for Improvement, Resilience  Sleep  Quality:Poor  (because of the lactulose) Duration:   hours  Physical Exam  Physical Exam Vitals and nursing note reviewed.  Constitutional:      General: She is not in acute distress.    Appearance: She is not ill-appearing or diaphoretic.  HENT:     Head: Normocephalic and atraumatic.  Eyes:     Conjunctiva/sclera: Conjunctivae normal.  Pulmonary:     Effort: Pulmonary effort is normal. No respiratory distress.  Neurological:     Mental Status: She is alert.     Gait: Gait normal.    Blood pressure 124/84, pulse 92, temperature 98 F (36.7 C), temperature source Oral, resp. rate 18, last menstrual period 05/17/2014, SpO2 100%. There is no height or weight on file to calculate BMI.  Treatment Plan Summary: Daily contact with patient to assess and evaluate symptoms and progress in treatment and Medication management Principal Problem:   Alcohol use disorder Active Problems:   Insomnia   MDD (major depressive disorder), recurrent episode, severe (HCC)   Cocaine use disorder, severe, dependence (HCC)   QT prolongation   Low vitamin D level   Epilepsy, unspecified, intractable, without status epilepticus (HCC)   Nicotine  use disorder   Cannabis use disorder   Increased ammonia level   Hepatic encephalopathy (HCC)  Patient's mentation and much better today after starting lactulose and decreasing depakote , consistent with hepatic  encephalopathy, suspect 2/2 EtOH + depakote . Concern for possible liver cirrhosis.  Today is last day for librium  taper. She still wants to go to rehab  # AUD, severe # Epilepsy, intractable, unspecified # Expressive aphasia CIWA with ativan  PRN per protocol with thiamine  & MV supplement Continued librium  taper (ends 5/4) Completed depakote  taper 5/3 Continued home gabapentin  300 mg TID Continued home folic acid  daily Continued home melatonin 5 mg qPM Continued PRN remeron 7.5 mg qPM Continued campral 666 mg TID (s5/11/2023)  # Nicotine  use  d/o NRTs Encouraged cessation   # Cocaine use d/o Encouraged cessation Comfort PRNs   # VitD Deficiency Continued VitD 400u supplement daily  # Hepatic encephalopathy # Elevated ammonium Continued lactulose 20 g BID (s5/11/2023) Recommend GI outpatient for cirrhosis workup Avoid hepatotoxic rx  PRNs: acetaminophen , 650 mg, Q6H PRN alum & mag hydroxide-simeth, 30 mL, Q4H PRN haloperidol , 5 mg, TID PRN  And diphenhydrAMINE , 50 mg, TID PRN haloperidol  lactate, 5 mg, TID PRN  And diphenhydrAMINE , 50 mg, TID PRN  And LORazepam , 2 mg, TID PRN haloperidol  lactate, 10 mg, TID PRN  And diphenhydrAMINE , 50 mg, TID PRN  And LORazepam , 2 mg, TID PRN hydrOXYzine , 25 mg, Q6H PRN loperamide , 2-4 mg, PRN magnesium  hydroxide, 30 mL, Daily PRN mirtazapine, 7.5 mg, QHS PRN ondansetron , 4 mg, Q6H PRN     Clinical Course as of 01/11/24 1502  Sun Jan 11, 2024  0815 Ammonia(!): 57 [JN]  0816 Vitamin B12: 507 [JN]  0816 Vitamin D, 25-Hydroxy(!): 26.24 [JN]  0816 EKG 12-Lead QT/QTcB 470/499 ms [JN]    Clinical Course User Index [JN] Georges Kings, DO    Abnormal Labs Reviewed  AMMONIA - Abnormal; Notable for the following components:      Result Value   Ammonia 57 (*)    All other components within normal limits  VITAMIN D 25 HYDROXY (VIT D DEFICIENCY, FRACTURES) - Abnormal; Notable for the following components:   Vit D, 25-Hydroxy 26.24 (*)    All other components within normal limits     Georges Kings, DO Psych Resident, PGY-3 01/11/2024 3:02 PM

## 2024-01-11 NOTE — ED Notes (Signed)
 Patient was provided breakfast

## 2024-01-11 NOTE — Group Note (Signed)
 Group Topic: Social Support  Group Date: 01/11/2024 Start Time: 1230 End Time: 1330 Facilitators: Danaysha Kirn, RN  Department: [facility]  Number of Participants: 8  Group Focus: community group, problem solving, and social skills Treatment Modality:  Skills Training Interventions utilized were group exercise, leisure development, and problem solving Purpose: enhance coping skills, improve communication skills, and reinforce self-care  Name: Theresa Rios Date of Birth: [birth year]  MR: [id number]    Level of Participation: moderate Quality of Participation: attentive and cooperative Interactions with others: gave feedback Mood/Affect: appropriate Triggers (if applicable):   Cognition: concrete Progress: Moderate Response:   Plan: follow-up needed  Patients Problems:  Patient Active Problem List   Diagnosis Date Noted   Low vitamin D level 01/10/2024   Epilepsy, unspecified, intractable, without status epilepticus (HCC) 01/10/2024   Nicotine  use disorder 01/10/2024   Cannabis use disorder 01/10/2024   Increased ammonia level 01/10/2024   Expressive aphasia 01/10/2024   QT prolongation 01/09/2024   Cocaine use disorder, severe, dependence (HCC) 01/21/2023   Non compliance with medical treatment 01/21/2023   Housing instability 09/06/2022   MDD (major depressive disorder), recurrent, severe, with psychosis (HCC) 08/02/2022   MDD (major depressive disorder), recurrent episode, severe (HCC) 05/10/2022   Seizure (HCC) 04/11/2022   MDD (major depressive episode), single episode, severe, no psychosis (HCC) 03/21/2022   Alcohol use disorder 03/21/2022   Anxiety state 03/21/2022   Insomnia 03/21/2022   MDD (major depressive disorder), recurrent severe, without psychosis (HCC) 03/21/2022

## 2024-01-11 NOTE — ED Notes (Signed)
 Patient was provided dinner

## 2024-01-11 NOTE — ED Notes (Signed)
 Patient is asleep in bed without issue or complaint.  No distress.  Will monitor.

## 2024-01-11 NOTE — ED Notes (Signed)
 Pt is in the dayroom calm and pleasant watching TV with other patients. NAD, denies needing anything atm. Respirations even and unlabored. Will continue to monitor for safety.

## 2024-01-11 NOTE — Group Note (Signed)
 Group Topic: Relaxation  Group Date: 01/11/2024 Start Time: 1245 End Time: 1330 Facilitators: Rozetta Corns  Department: [facility]  Number of Participants: 4  Group Focus: relaxation Treatment Modality:  Psychoeducation Interventions utilized were patient education Purpose: increase insight  Name: Theresa Rios Date of Birth: 11/26/1965  MR: [id number]    Level of Participation: minimal Quality of Participation: distractible Interactions with others: gave feedback Mood/Affect: restless Triggers (if applicable): N/A Cognition: loose and not focused Progress: Minimal Response: Patient was offered to go outside for relaxation and gardening time. Patient was not focused and making sarcastic comments until she returned back inside Plan: follow-up needed  Patients Problems:  Patient Active Problem List   Diagnosis Date Noted   Hepatic encephalopathy (HCC) 01/11/2024   Low vitamin D level 01/10/2024   Epilepsy, unspecified, intractable, without status epilepticus (HCC) 01/10/2024   Nicotine  use disorder 01/10/2024   Cannabis use disorder 01/10/2024   Increased ammonia level 01/10/2024   QT prolongation 01/09/2024   Cocaine use disorder, severe, dependence (HCC) 01/21/2023   Non compliance with medical treatment 01/21/2023   Housing instability 09/06/2022   MDD (major depressive disorder), recurrent, severe, with psychosis (HCC) 08/02/2022   MDD (major depressive disorder), recurrent episode, severe (HCC) 05/10/2022   Seizure (HCC) 04/11/2022   MDD (major depressive episode), single episode, severe, no psychosis (HCC) 03/21/2022   Alcohol use disorder 03/21/2022   Insomnia 03/21/2022   MDD (major depressive disorder), recurrent severe, without psychosis (HCC) 03/21/2022

## 2024-01-12 DIAGNOSIS — F1010 Alcohol abuse, uncomplicated: Secondary | ICD-10-CM | POA: Diagnosis not present

## 2024-01-12 DIAGNOSIS — E559 Vitamin D deficiency, unspecified: Secondary | ICD-10-CM | POA: Diagnosis not present

## 2024-01-12 DIAGNOSIS — G40919 Epilepsy, unspecified, intractable, without status epilepticus: Secondary | ICD-10-CM | POA: Diagnosis not present

## 2024-01-12 DIAGNOSIS — F1420 Cocaine dependence, uncomplicated: Secondary | ICD-10-CM | POA: Diagnosis not present

## 2024-01-12 NOTE — ED Notes (Signed)
 Pt calm and sleeping in the bedroom, NAD. Respirations even and unlabored. Will monitor for safety.

## 2024-01-12 NOTE — ED Notes (Addendum)
 Pt calm and sleeping in the bedroom, NAD.  Will monitor for safety.

## 2024-01-12 NOTE — ED Notes (Signed)
 Patient is lying in her room reading, patient appears to be in no acute distress, respirations are even and unlabored, will continue to monitor patient for safety.,

## 2024-01-12 NOTE — Group Note (Signed)
 Group Topic: Recovery Basics  Group Date: 01/12/2024 Start Time: 1130 End Time: 1200 Facilitators: Arlan Belling, RN  Department: [facility]  Number of Participants: 9  Group Focus: chemical dependency education and chemical dependency issues Treatment Modality:  Cognitive Behavioral Therapy Interventions utilized were clarification, exploration, group exercise, and patient education Purpose: enhance coping skills, explore maladaptive thinking, express feelings, express irrational fears, improve communication skills, increase insight, regain self-worth, reinforce self-care, and relapse prevention strategies  Name: Theresa Rios Date of Birth: 10/20/1965  MR: [id number]    Level of Participation: active Quality of Participation: attention seeking Interactions with others: monopolizing Mood/Affect: labile Triggers (if applicable):   Cognition: no insight Progress: Minimal Response:   Plan: follow-up needed  Patients Problems:  Patient Active Problem List   Diagnosis Date Noted   Hepatic encephalopathy (HCC) 01/11/2024   Low vitamin D level 01/10/2024   Epilepsy, unspecified, intractable, without status epilepticus (HCC) 01/10/2024   Nicotine  use disorder 01/10/2024   Cannabis use disorder 01/10/2024   Increased ammonia level 01/10/2024   QT prolongation 01/09/2024   Cocaine use disorder, severe, dependence (HCC) 01/21/2023   Non compliance with medical treatment 01/21/2023   Housing instability 09/06/2022   MDD (major depressive disorder), recurrent, severe, with psychosis (HCC) 08/02/2022   MDD (major depressive disorder), recurrent episode, severe (HCC) 05/10/2022   Seizure (HCC) 04/11/2022   MDD (major depressive episode), single episode, severe, no psychosis (HCC) 03/21/2022   Alcohol use disorder 03/21/2022   Insomnia 03/21/2022   MDD (major depressive disorder), recurrent severe, without psychosis (HCC) 03/21/2022

## 2024-01-12 NOTE — Discharge Planning (Signed)
 LCSW met with patient to check in and reassure her she did still have bed for her that would be ready on Wednesday at [facility] and specifics related to cab transporting approximately 8 AM and provided her with the list of items needed for [facility] she could have someone bring her after she arrives. Will continue to follow and assist as indicated.

## 2024-01-12 NOTE — Group Note (Signed)
 Group Topic: Relapse and Recovery  Group Date: 01/12/2024 Start Time: 1000 End Time: 1100 Facilitators: Milan Alfred, NT 3. MHT Department: [facility]  Number of Participants: 7  Group Focus: relapse prevention Treatment Modality:  Solution-Focused Therapy Interventions utilized were problem solving Purpose: relapse prevention strategies  Name: Theresa Rios Date of Birth: September 20, 1965  MR: [id number]    Level of Participation: Patient did not attend group. Quality of Participation: N/A Interactions with others: N/A Mood/Affect: N/A Triggers (if applicable): N/A Cognition: N/A Progress: N/A Response: N/A Plan: N/A  Patients Problems:  Patient Active Problem List   Diagnosis Date Noted   Hepatic encephalopathy (HCC) 01/11/2024   Low vitamin D level 01/10/2024   Epilepsy, unspecified, intractable, without status epilepticus (HCC) 01/10/2024   Nicotine  use disorder 01/10/2024   Cannabis use disorder 01/10/2024   Increased ammonia level 01/10/2024   QT prolongation 01/09/2024   Cocaine use disorder, severe, dependence (HCC) 01/21/2023   Non compliance with medical treatment 01/21/2023   Housing instability 09/06/2022   MDD (major depressive disorder), recurrent, severe, with psychosis (HCC) 08/02/2022   MDD (major depressive disorder), recurrent episode, severe (HCC) 05/10/2022   Seizure (HCC) 04/11/2022   MDD (major depressive episode), single episode, severe, no psychosis (HCC) 03/21/2022   Alcohol use disorder 03/21/2022   Insomnia 03/21/2022   MDD (major depressive disorder), recurrent severe, without psychosis (HCC) 03/21/2022

## 2024-01-12 NOTE — ED Provider Notes (Signed)
 Behavioral Health Progress Note  Date and Time: 01/12/2024 2:47 PM Name: BRIAUNNA MABEY MRN:  [id number] CC: detox   Subjective:  SHAWNTEL CANCHE is a 58 y.o. 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  Case discussed with Child psychotherapist and RN on the unit.  Patient continues to report depressed mood.  Per staff report patient has been staying irritable on the unit.  Patient's ammonia level was high on 01/10/2024.  Patient was started on lactulose.  At the same time patient is also on Depakote .  Will repeat ammonia level tomorrow morning.  Will consider tapering off and discontinuing Depakote  if ammonia level is still high.  Patient today denies thoughts of harming  herself or others.  She denies auditory visual hallucinations.  Patient is hopeful to go to [facility].   Review of Systems  Constitutional:  Negative for malaise/fatigue.  Respiratory:  Negative for shortness of breath.   Cardiovascular:  Negative for chest pain.  Gastrointestinal:  Negative for abdominal pain, blood in stool, diarrhea, nausea and vomiting.  Neurological:  Negative for dizziness, tremors and headaches.    Diagnosis:  Final diagnoses:  Alcohol use disorder  Cocaine use disorder, severe, dependence (HCC)  Cannabis use disorder  Nicotine  use disorder  Intractable epilepsy without status epilepticus, unspecified epilepsy type (HCC)  QT prolongation  Low vitamin D level  Increased ammonia level  Hepatic encephalopathy (HCC)    Past Psychiatric History: See H&P Past Medical History: See H&P Family History: See H&P Family Psychiatric  History: See H&P Social History: See H&P                  Current Medications:  Current Facility-Administered Medications  Medication Dose Route Frequency Provider Last Rate Last Admin   acamprosate (CAMPRAL) tablet 666 mg  666 mg Oral TID with meals Nguyen, Julie, DO   666 mg at 01/12/24 1135   acetaminophen  (TYLENOL ) tablet 650 mg  650 mg Oral Q6H PRN Brent, Amanda C, NP   650 mg at 01/11/24 2111   alum & mag hydroxide-simeth (MAALOX/MYLANTA) 200-200-20 MG/5ML suspension 30 mL  30 mL Oral Q4H PRN Davia Erps, NP   30 mL at 01/12/24 0950   cholecalciferol (VITAMIN D3) 10 MCG (400 UNIT) tablet 400 Units  400 Units Oral Daily Floyce Hutching, MD   400 Units at 01/12/24 7829   haloperidol  (HALDOL ) tablet 5 mg  5 mg Oral TID PRN Brent, Amanda C, NP       And   diphenhydrAMINE  (BENADRYL ) capsule 50 mg  50 mg Oral TID PRN Brent, Amanda C, NP       haloperidol  lactate (HALDOL ) injection 5 mg  5 mg Intramuscular TID PRN Davia Erps, NP       And   diphenhydrAMINE  (BENADRYL ) injection 50 mg  50 mg Intramuscular TID PRN Brent, Amanda C, NP       And   LORazepam  (ATIVAN ) injection 2 mg  2 mg Intramuscular TID PRN Brent, Amanda C, NP       haloperidol  lactate (HALDOL ) injection 10 mg  10 mg Intramuscular TID PRN Davia Erps, NP       And   diphenhydrAMINE  (BENADRYL ) injection  50 mg  50 mg Intramuscular TID PRN Brent, Amanda C, NP       And   LORazepam  (ATIVAN ) injection 2 mg  2 mg Intramuscular TID PRN Brent, Amanda C, NP       folic acid  (FOLVITE ) tablet 1 mg  1 mg Oral Daily Brent, Amanda C, NP   1 mg at 01/12/24 1610   gabapentin  (NEURONTIN ) capsule 300 mg  300 mg Oral TID Floyce Hutching, MD   300 mg at 01/12/24 0947   lactulose (CHRONULAC) 10 GM/15ML solution 20 g  20 g Oral BID Nguyen, Julie, DO   20 g at 01/12/24 9604   magnesium  hydroxide (MILK OF MAGNESIA) suspension 30 mL  30 mL Oral Daily PRN Brent, Amanda C, NP       melatonin tablet 5 mg  5 mg Oral QHS Hill, Alonza Arthurs, MD   5 mg at 01/11/24 2111    mirtazapine (REMERON) tablet 7.5 mg  7.5 mg Oral QHS PRN Floyce Hutching, MD       multivitamin with minerals tablet 1 tablet  1 tablet Oral Daily Brent, Amanda C, NP   1 tablet at 01/12/24 5409   nicotine  (NICODERM CQ  - dosed in mg/24 hours) patch 14 mg  14 mg Transdermal Daily Floyce Hutching, MD   14 mg at 01/12/24 8119   thiamine  (VITAMIN B1) injection 100 mg  100 mg Intramuscular Once Brent, Amanda C, NP       thiamine  (VITAMIN B1) tablet 100 mg  100 mg Oral QHS Nguyen, Julie, DO   100 mg at 01/11/24 2111   Current Outpatient Medications  Medication Sig Dispense Refill   divalproex  (DEPAKOTE ) 500 MG DR tablet Take 1 tablet (500 mg total) by mouth every 12 (twelve) hours. 180 tablet 3   folic acid  (FOLVITE ) 1 MG tablet Take 1 tablet (1 mg total) by mouth daily. 90 tablet 3   gabapentin  (NEURONTIN ) 300 MG capsule Take 1 capsule (300 mg total) by mouth at bedtime. 90 capsule 3   thiamine  (VITAMIN B1) 100 MG tablet Take 1 tablet (100 mg total) by mouth daily. 90 tablet 3   Labs  Lab Results:  Admission on 01/08/2024  Component Date Value Ref Range Status   Ammonia 01/10/2024 57 (H)  9 - 35 umol/L Final   Performed at [facility], [address], [place], [place] [address]   Vitamin B-12 01/09/2024 507  180 - 914 pg/mL Final   Comment: (NOTE) This assay is not validated for testing neonatal or myeloproliferative syndrome specimens for Vitamin B12 levels. Performed at [facility], [address], [place], [place] [address]    Vit D, 25-Hydroxy 01/09/2024 26.24 (L)  30 - 100 ng/mL Final   Comment: (NOTE) Vitamin D deficiency has been defined by the Institute of Medicine  and an Endocrine Society practice guideline as a level of serum 25-OH  vitamin D less than 20 ng/mL (1,2). The Endocrine Society went on to  further define vitamin D insufficiency as a level between 21 and 29  ng/mL (2).  1. IOM (Institute of Medicine). 2010. Dietary reference intakes for   calcium and D. [place]: [employer]. 2. Holick MF, Binkley [place], Bischoff-Ferrari HA, et al. Evaluation,  treatment, and prevention of vitamin D deficiency: an Endocrine  Society clinical practice guideline, JCEM. 2011 Jul; 96(7): 1911-30.  Performed at [facility], [address], [place], [place] [address]   Admission  on 01/08/2024, Discharged on 01/08/2024  Component Date Value Ref Range Status   WBC 01/08/2024 10.1  4.0 - 10.5 K/uL Final   RBC 01/08/2024 4.97  3.87 - 5.11 MIL/uL Final   Hemoglobin 01/08/2024 14.4  12.0 - 15.0 g/dL Final   HCT 09/81/1914 44.6  36.0 - 46.0 % Final   MCV 01/08/2024 89.7  80.0 - 100.0 fL Final   MCH 01/08/2024 29.0  26.0 - 34.0 pg Final   MCHC 01/08/2024 32.3  30.0 - 36.0 g/dL Final   RDW 78/29/5621 14.3  11.5 - 15.5 % Final   Platelets 01/08/2024 262  150 - 400 K/uL Final   nRBC 01/08/2024 0.0  0.0 - 0.2 % Final   Neutrophils Relative % 01/08/2024 53  % Final   Neutro Abs 01/08/2024 5.3  1.7 - 7.7 K/uL Final   Lymphocytes Relative 01/08/2024 32  % Final   Lymphs Abs 01/08/2024 3.2  0.7 - 4.0 K/uL Final   Monocytes Relative 01/08/2024 14  % Final   Monocytes Absolute 01/08/2024 1.4 (H)  0.1 - 1.0 K/uL Final   Eosinophils Relative 01/08/2024 1  % Final   Eosinophils Absolute 01/08/2024 0.1  0.0 - 0.5 K/uL Final   Basophils Relative 01/08/2024 0  % Final   Basophils Absolute 01/08/2024 0.0  0.0 - 0.1 K/uL Final   Immature Granulocytes 01/08/2024 0  % Final   Abs Immature Granulocytes 01/08/2024 0.03  0.00 - 0.07 K/uL Final   Performed at [facility], [address] [place], [place] [address]   Sodium 01/08/2024 139  135 - 145 mmol/L Final   Potassium 01/08/2024 4.7  3.5 - 5.1 mmol/L Final   Chloride 01/08/2024 101  98 - 111 mmol/L Final   CO2 01/08/2024 27  22 - 32 mmol/L Final   Glucose, Bld 01/08/2024 78  70 - 99 mg/dL Final   Glucose reference range applies only to samples taken after fasting for at least 8  hours.   BUN 01/08/2024 9  6 - 20 mg/dL Final   Creatinine, Ser 01/08/2024 0.71  0.44 - 1.00 mg/dL Final   Calcium 78/46/9629 9.6  8.9 - 10.3 mg/dL Final   Total Protein 52/84/1324 6.7  6.5 - 8.1 g/dL Final   Albumin 40/06/2724 3.6  3.5 - 5.0 g/dL Final   AST 36/64/4034 25  15 - 41 U/L Final   ALT 01/08/2024 22  0 - 44 U/L Final   Alkaline Phosphatase 01/08/2024 57  38 - 126 U/L Final   Total Bilirubin 01/08/2024 0.6  0.0 - 1.2 mg/dL Final   GFR, Estimated 01/08/2024 >60  >60 mL/min Final   Comment: (NOTE) Calculated using the CKD-EPI Creatinine Equation (2021)    Anion gap 01/08/2024 11  5 - 15 Final   Performed at [facility], [address] [place], [place] [address]   Hgb A1c MFr Bld 01/08/2024 5.5  4.8 - 5.6 % Final   Comment: (NOTE) Pre diabetes:          5.7%-6.4%  Diabetes:              >6.4%  Glycemic control for   <7.0% adults with diabetes    Mean Plasma Glucose 01/08/2024 111.15  mg/dL Final   Performed at [facility], [address] [place], [place] [address]   Magnesium  01/08/2024 2.4  1.7 - 2.4 mg/dL Final   Performed at [facility], [address] [place], [place] [address]  Alcohol, Ethyl (B) 01/08/2024 <15  <15 mg/dL Final   Comment: Please note change in reference range. (NOTE) For medical purposes only. Performed at [facility], [address], [place], [place] [address]    Cholesterol 01/08/2024 214 (H)  0 - 200 mg/dL Final   Triglycerides 40/34/7425 120  <150 mg/dL Final   HDL 95/63/8756 99  >40 mg/dL Final   Total CHOL/HDL Ratio 01/08/2024 2.2  RATIO Final   VLDL 01/08/2024 24  0 - 40 mg/dL Final   LDL Cholesterol 01/08/2024 91  0 - 99 mg/dL Final   Comment:        Total Cholesterol/HDL:CHD Risk Coronary Heart Disease Risk Table                     Men   Women  1/2 Average Risk   3.4   3.3  Average Risk       5.0   4.4  2 X Average Risk   9.6   7.1  3 X Average Risk  23.4   11.0        Use the calculated Patient  Ratio above and the CHD Risk Table to determine the patient's CHD Risk.        ATP III CLASSIFICATION (LDL):  <100     mg/dL   Optimal  433-295  mg/dL   Near or Above                    Optimal  130-159  mg/dL   Borderline  188-416  mg/dL   High  >606     mg/dL   Very High Performed at [facility], [address], [place], [place] [address]    TSH 01/08/2024 1.077  0.350 - 4.500 uIU/mL Final   Comment: Performed by a 3rd Generation assay with a functional sensitivity of <=0.01 uIU/mL. Performed at [facility], [address], [place], [place] [address]    POC Amphetamine UR 01/08/2024 None Detected  NONE DETECTED (Cut Off Level 1000 ng/mL) Final   POC Secobarbital (BAR) 01/08/2024 None Detected  NONE DETECTED (Cut Off Level 300 ng/mL) Final   POC Buprenorphine (BUP) 01/08/2024 None Detected  NONE DETECTED (Cut Off Level 10 ng/mL) Final   POC Oxazepam (BZO) 01/08/2024 None Detected  NONE DETECTED (Cut Off Level 300 ng/mL) Final   POC Cocaine UR 01/08/2024 Positive (A)  NONE DETECTED (Cut Off Level 300 ng/mL) Final   POC Methamphetamine UR 01/08/2024 None Detected  NONE DETECTED (Cut Off Level 1000 ng/mL) Final   POC Morphine  01/08/2024 None Detected  NONE DETECTED (Cut Off Level 300 ng/mL) Final   POC Methadone UR 01/08/2024 None Detected  NONE DETECTED (Cut Off Level 300 ng/mL) Final   POC Oxycodone UR 01/08/2024 None Detected  NONE DETECTED (Cut Off Level 100 ng/mL) Final   POC Marijuana UR 01/08/2024 Positive (A)  NONE DETECTED (Cut Off Level 50 ng/mL) Final   Valproic Acid  Lvl 01/08/2024 35 (L)  50 - 100 ug/mL Final   Performed at [facility], [address], [place], [place] [address]  Admission on 12/03/2023, Discharged on 12/04/2023  Component Date Value Ref Range Status   WBC 12/03/2023 10.1  4.0 - 10.5 K/uL Final   RBC 12/03/2023 5.15 (H)  3.87 - 5.11 MIL/uL Final   Hemoglobin 12/03/2023 14.7  12.0 - 15.0 g/dL Final   HCT 22/10/5425 46.0  36.0 - 46.0 %  Final  MCV 12/03/2023 89.3  80.0 - 100.0 fL Final   MCH 12/03/2023 28.5  26.0 - 34.0 pg Final   MCHC 12/03/2023 32.0  30.0 - 36.0 g/dL Final   RDW 40/98/1191 14.6  11.5 - 15.5 % Final   Platelets 12/03/2023 210  150 - 400 K/uL Final   REPEATED TO VERIFY   nRBC 12/03/2023 0.0  0.0 - 0.2 % Final   Neutrophils Relative % 12/03/2023 72  % Final   Neutro Abs 12/03/2023 7.3  1.7 - 7.7 K/uL Final   Lymphocytes Relative 12/03/2023 19  % Final   Lymphs Abs 12/03/2023 1.9  0.7 - 4.0 K/uL Final   Monocytes Relative 12/03/2023 8  % Final   Monocytes Absolute 12/03/2023 0.8  0.1 - 1.0 K/uL Final   Eosinophils Relative 12/03/2023 1  % Final   Eosinophils Absolute 12/03/2023 0.1  0.0 - 0.5 K/uL Final   Basophils Relative 12/03/2023 0  % Final   Basophils Absolute 12/03/2023 0.0  0.0 - 0.1 K/uL Final   Immature Granulocytes 12/03/2023 0  % Final   Abs Immature Granulocytes 12/03/2023 0.03  0.00 - 0.07 K/uL Final   Performed at [facility], [address] [place], [place] [address]   Color, Urine 12/04/2023 YELLOW  YELLOW Final   APPearance 12/04/2023 CLEAR  CLEAR Final   Specific Gravity, Urine 12/04/2023 1.011  1.005 - 1.030 Final   pH 12/04/2023 6.0  5.0 - 8.0 Final   Glucose, UA 12/04/2023 NEGATIVE  NEGATIVE mg/dL Final   Hgb urine dipstick 12/04/2023 NEGATIVE  NEGATIVE Final   Bilirubin Urine 12/04/2023 NEGATIVE  NEGATIVE Final   Ketones, ur 12/04/2023 NEGATIVE  NEGATIVE mg/dL Final   Protein, ur 56/21/3086 NEGATIVE  NEGATIVE mg/dL Final   Nitrite 57/84/6962 NEGATIVE  NEGATIVE Final   Leukocytes,Ua 12/04/2023 NEGATIVE  NEGATIVE Final   Performed at [facility], [address], [place], [place] [address]   Valproic Acid  Lvl 12/03/2023 <10 (L)  50.0 - 100.0 ug/mL Final   Comment: RESULT CONFIRMED BY MANUAL DILUTION Performed at [facility], [address] [place], [place] [address]    Glucose-Capillary 12/03/2023 143 (H)  70 - 99 mg/dL Final   Glucose reference range  applies only to samples taken after fasting for at least 8 hours.   Sodium 12/04/2023 141  135 - 145 mmol/L Final   Potassium 12/04/2023 4.2  3.5 - 5.1 mmol/L Final   HEMOLYSIS AT THIS LEVEL MAY AFFECT RESULT   Chloride 12/04/2023 108  98 - 111 mmol/L Final   CO2 12/04/2023 24  22 - 32 mmol/L Final   Glucose, Bld 12/04/2023 118 (H)  70 - 99 mg/dL Final   Glucose reference range applies only to samples taken after fasting for at least 8 hours.   BUN 12/04/2023 5 (L)  6 - 20 mg/dL Final   Creatinine, Ser 12/04/2023 0.74  0.44 - 1.00 mg/dL Final   Calcium 09/11/7251 9.1  8.9 - 10.3 mg/dL Final   Total Protein 66/44/0347 6.4 (L)  6.5 - 8.1 g/dL Final   Albumin 42/59/5638 3.3 (L)  3.5 - 5.0 g/dL Final   AST 75/64/3329 26  15 - 41 U/L Final   HEMOLYSIS AT THIS LEVEL MAY AFFECT RESULT   ALT 12/04/2023 17  0 - 44 U/L Final   HEMOLYSIS AT THIS LEVEL MAY AFFECT RESULT   Alkaline Phosphatase 12/04/2023 67  38 - 126 U/L Final   Total Bilirubin 12/04/2023 0.9  0.0 -  1.2 mg/dL Final   HEMOLYSIS AT THIS LEVEL MAY AFFECT RESULT   GFR, Estimated 12/04/2023 >60  >60 mL/min Final   Comment: (NOTE) Calculated using the CKD-EPI Creatinine Equation (2021)    Anion gap 12/04/2023 9  5 - 15 Final   Performed at [facility], [address], [place], [place] [address]   Magnesium  12/04/2023 2.1  1.7 - 2.4 mg/dL Final   Performed at [facility], [address] [place], [place] [address]  Office Visit on 10/28/2023  Component Date Value Ref Range Status   Valproic Acid  Lvl 10/28/2023 47 (L)  50 - 100 ug/mL Final   Comment:                                 Detection Limit = 4                            <4 indicates None Detected Toxicity may occur at levels of 100-500. Measurements of free unbound valproic acid  may improve the assess- ment of clinical response.    Glucose 10/28/2023 105 (H)  70 - 99 mg/dL Final   BUN 95/28/4132 5 (L)  6 - 24 mg/dL Final   Creatinine, Ser 10/28/2023 0.66  0.57  - 1.00 mg/dL Final   eGFR 44/09/270 102  >59 mL/min/1.73 Final   BUN/Creatinine Ratio 10/28/2023 8 (L)  9 - 23 Final   Sodium 10/28/2023 143  134 - 144 mmol/L Final   Potassium 10/28/2023 4.0  3.5 - 5.2 mmol/L Final   Chloride 10/28/2023 101  96 - 106 mmol/L Final   CO2 10/28/2023 21  20 - 29 mmol/L Final   Calcium 10/28/2023 9.8  8.7 - 10.2 mg/dL Final   Total Protein 53/66/4403 7.1  6.0 - 8.5 g/dL Final   Albumin 47/42/5956 4.3  3.8 - 4.9 g/dL Final   Globulin, Total 10/28/2023 2.8  1.5 - 4.5 g/dL Final   Bilirubin Total 10/28/2023 <0.2  0.0 - 1.2 mg/dL Final   Alkaline Phosphatase 10/28/2023 99  44 - 121 IU/L Final   AST 10/28/2023 19  0 - 40 IU/L Final   ALT 10/28/2023 14  0 - 32 IU/L Final  Admission on 08/30/2023, Discharged on 08/31/2023  Component Date Value Ref Range Status   WBC 08/30/2023 15.0 (H)  4.0 - 10.5 K/uL Final   RBC 08/30/2023 5.47 (H)  3.87 - 5.11 MIL/uL Final   Hemoglobin 08/30/2023 15.3 (H)  12.0 - 15.0 g/dL Final   HCT 38/75/6433 48.8 (H)  36.0 - 46.0 % Final   MCV 08/30/2023 89.2  80.0 - 100.0 fL Final   MCH 08/30/2023 28.0  26.0 - 34.0 pg Final   MCHC 08/30/2023 31.4  30.0 - 36.0 g/dL Final   RDW 29/51/8841 14.8  11.5 - 15.5 % Final   Platelets 08/30/2023 240  150 - 400 K/uL Final   nRBC 08/30/2023 0.0  0.0 - 0.2 % Final   Neutrophils Relative % 08/30/2023 71  % Final   Neutro Abs 08/30/2023 10.7 (H)  1.7 - 7.7 K/uL Final   Lymphocytes Relative 08/30/2023 20  % Final   Lymphs Abs 08/30/2023 3.0  0.7 - 4.0 K/uL Final   Monocytes Relative 08/30/2023 8  % Final   Monocytes Absolute 08/30/2023 1.2 (H)  0.1 - 1.0 K/uL Final   Eosinophils Relative 08/30/2023 0  %  Final   Eosinophils Absolute 08/30/2023 0.1  0.0 - 0.5 K/uL Final   Basophils Relative 08/30/2023 0  % Final   Basophils Absolute 08/30/2023 0.0  0.0 - 0.1 K/uL Final   Immature Granulocytes 08/30/2023 1  % Final   Abs Immature Granulocytes 08/30/2023 0.07  0.00 - 0.07 K/uL Final   Performed at  [facility], [address], [place], [place] [address]   Sodium 08/30/2023 135  135 - 145 mmol/L Final   Potassium 08/30/2023 3.3 (L)  3.5 - 5.1 mmol/L Final   Chloride 08/30/2023 98  98 - 111 mmol/L Final   CO2 08/30/2023 19 (L)  22 - 32 mmol/L Final   Glucose, Bld 08/30/2023 150 (H)  70 - 99 mg/dL Final   Glucose reference range applies only to samples taken after fasting for at least 8 hours.   BUN 08/30/2023 8  6 - 20 mg/dL Final   Creatinine, Ser 08/30/2023 0.82  0.44 - 1.00 mg/dL Final   Calcium 60/45/4098 8.9  8.9 - 10.3 mg/dL Final   Total Protein 11/91/4782 8.8 (H)  6.5 - 8.1 g/dL Final   Albumin 95/62/1308 4.5  3.5 - 5.0 g/dL Final   AST 65/78/4696 35  15 - 41 U/L Final   ALT 08/30/2023 22  0 - 44 U/L Final   Alkaline Phosphatase 08/30/2023 87  38 - 126 U/L Final   Total Bilirubin 08/30/2023 0.6  <1.2 mg/dL Final   GFR, Estimated 08/30/2023 >60  >60 mL/min Final   Comment: (NOTE) Calculated using the CKD-EPI Creatinine Equation (2021)    Anion gap 08/30/2023 18 (H)  5 - 15 Final   Performed at [facility], [address] [place], [place] [address]   Alcohol, Ethyl (B) 08/30/2023 <10  <10 mg/dL Final   Comment: (NOTE) Lowest detectable limit for serum alcohol is 10 mg/dL.  For medical purposes only. Performed at [facility], [address], [place], [place] [address]    Opiates 08/30/2023 NONE DETECTED  NONE DETECTED Final   Cocaine 08/30/2023 POSITIVE (A)  NONE DETECTED Final   Benzodiazepines 08/30/2023 NONE DETECTED  NONE DETECTED Final   Amphetamines 08/30/2023 NONE DETECTED  NONE DETECTED Final   Tetrahydrocannabinol 08/30/2023 NONE DETECTED  NONE DETECTED Final   Barbiturates 08/30/2023 NONE DETECTED  NONE DETECTED Final   Comment: (NOTE) DRUG SCREEN FOR MEDICAL PURPOSES ONLY.  IF CONFIRMATION IS NEEDED FOR ANY PURPOSE, NOTIFY LAB WITHIN 5 DAYS.  LOWEST DETECTABLE LIMITS FOR URINE DRUG SCREEN Drug  Class                     Cutoff (ng/mL) Amphetamine and metabolites    1000 Barbiturate and metabolites    200 Benzodiazepine                 200 Opiates and metabolites        300 Cocaine and metabolites        300 THC                            50 Performed at [facility] [address], [place], [place] [address]    HIV Screen 4th Generation wRfx 08/31/2023 Non Reactive  Non Reactive Final   Performed at [facility], [address], [place], [place] [address]   Sodium 08/31/2023 136  135 - 145 mmol/L Final   Potassium 08/31/2023 3.1 (  L)  3.5 - 5.1 mmol/L Final   Chloride 08/31/2023 102  98 - 111 mmol/L Final   CO2 08/31/2023 24  22 - 32 mmol/L Final   Glucose, Bld 08/31/2023 97  70 - 99 mg/dL Final   Glucose reference range applies only to samples taken after fasting for at least 8 hours.   BUN 08/31/2023 8  6 - 20 mg/dL Final   Creatinine, Ser 08/31/2023 0.52  0.44 - 1.00 mg/dL Final   Calcium 41/32/4401 9.4  8.9 - 10.3 mg/dL Final   Total Protein 02/72/5366 7.6  6.5 - 8.1 g/dL Final   Albumin 44/11/4740 3.8  3.5 - 5.0 g/dL Final   AST 59/56/3875 17  15 - 41 U/L Final   ALT 08/31/2023 15  0 - 44 U/L Final   Alkaline Phosphatase 08/31/2023 65  38 - 126 U/L Final   Total Bilirubin 08/31/2023 1.3 (H)  <1.2 mg/dL Final   GFR, Estimated 08/31/2023 >60  >60 mL/min Final   Comment: (NOTE) Calculated using the CKD-EPI Creatinine Equation (2021)    Anion gap 08/31/2023 10  5 - 15 Final   Performed at [facility], [address], [place], [place] [address]   WBC 08/31/2023 12.5 (H)  4.0 - 10.5 K/uL Final   RBC 08/31/2023 5.17 (H)  3.87 - 5.11 MIL/uL Final   Hemoglobin 08/31/2023 14.2  12.0 - 15.0 g/dL Final   HCT 95/18/8416 44.3  36.0 - 46.0 % Final   MCV 08/31/2023 85.7  80.0 - 100.0 fL Final   MCH 08/31/2023 27.5  26.0 - 34.0 pg Final   MCHC 08/31/2023 32.1  30.0 - 36.0 g/dL Final   RDW 60/63/0160 14.6  11.5 - 15.5 % Final    Platelets 08/31/2023 193  150 - 400 K/uL Final   nRBC 08/31/2023 0.0  0.0 - 0.2 % Final   Performed at [facility], [address], [place], [place] [address]   Magnesium  08/31/2023 2.5 (H)  1.7 - 2.4 mg/dL Final   Performed at [facility], [address], [place], [place] [address]    Blood Alcohol level:  Lab Results  Component Value Date   [facility] <15 01/08/2024   ETH <10 08/30/2023    Metabolic Disorder Labs: Lab Results  Component Value Date   HGBA1C 5.5 01/08/2024   MPG 111.15 01/08/2024   MPG 114 08/03/2022   No results found for: "PROLACTIN" Lab Results  Component Value Date   CHOL 214 (H) 01/08/2024   TRIG 120 01/08/2024   HDL 99 01/08/2024   CHOLHDL 2.2 01/08/2024   VLDL 24 01/08/2024   LDLCALC 91 01/08/2024   LDLCALC 130 (H) 09/01/2022    Therapeutic Lab Levels: No results found for: "LITHIUM" Lab Results  Component Value Date   VALPROATE 35 (L) 01/08/2024   VALPROATE <10 (L) 12/03/2023   No results found for: "CBMZ"  Physical Findings   AIMS    Flowsheet Row [facility]  AIMS Total Score 0      AUDIT    Flowsheet Row ED from 01/08/2024 in [facility] Admission (Discharged) from OP [facility] Ad[facility] (Discharged[facility] from 08[facility]02/[facility]22 [facility] Admission (Discharged) from [facility]Discharged[facility]  Alcohol Use Disorder  Identification Test Final Score (AUDIT) 40 11 11 12 11       CAGE-AID    Flowsheet Row ED to Hosp-Admission (Discharged) from 11/27/2022 in [facility] ED to Hosp-Admission (Discharged) from 04/11/2022 in [facility]  CAGE-AID Score 1 2       GAD-7    Flowsheet Row Office Visit from 04/22/2023 in [facility]  Total GAD-7 Score 21      PHQ2-9    Flowsheet Row ED from 01/08/2024 in [facility] Office Visit from 04/22/2023 in [facility] Office Visit from 04/03/2022 in [facility] [facility]  PHQ-2 Total Score 6 6 0  PHQ-9 Total Score 24 27 --      Flowsheet Row ED from 01/08/2024 in [facility] Most recent reading at 01/08/2024  4:39 PM ED from 01/08/2024 in [facility] Most recent reading at 01/08/2024 10:49 AM ED from 12/03/2023 in [facility] Emergency Department at [facility] Most recent reading at 12/03/2023 11:25 PM  C-SSRS RISK CATEGORY No Risk No Risk No Risk        Musculoskeletal  Strength & Muscle Tone: within normal limited Gait & Station: normal Patient leans: NA   Psychiatric Specialty Exam  Presentation General Appearance:Appropriate for Environment, Casual, Fairly Groomed Eye Contact:Good Speech:Clear and Coherent, Normal Rate (spontaneous) Volume:Normal Handedness:Right  Mood and Affect  Mood: ("good") Affect:Appropriate, Congruent, Full Range (anxious appearing at times)  Thought Process  Thought Process:Coherent, Goal Directed, Linear Descriptions of Associations:Intact  Thought Content Suicidal Thoughts:No Homicidal Thoughts:No Hallucinations:None Ideas of Reference:None Thought Content:Logical, Rumination  Sensorium  Memory:Immediate Good Judgment:Good Insight:Fair  Executive Functions  Orientation:Full (Time, Place and Person) Language:Good Concentration:Good Attention:Good Recall:Good Fund of Knowledge:Good  Psychomotor Activity  Psychomotor Activity:Psychomotor Activity: Normal  Assets  Assets:Communication Skills, Desire for Improvement, Resilience  Sleep  Quality:Poor (because of the  lactulose) Duration:   hours  Physical Exam  Physical Exam Vitals and nursing note reviewed.  Constitutional:      General: She is not in acute distress.    Appearance: She is not ill-appearing or diaphoretic.  HENT:     Head: Normocephalic and atraumatic.  Eyes:     Conjunctiva/sclera: Conjunctivae normal.  Pulmonary:     Effort: Pulmonary effort is normal. No respiratory distress.  Neurological:     Mental Status: She is alert.     Gait: Gait normal.    Blood pressure 107/83, pulse 90, temperature 98.4 F (36.9 C), temperature source Oral, resp. rate 18, last menstrual period 05/17/2014, SpO2 100%. There is no height or weight on file to calculate BMI.  Treatment Plan Summary: Daily contact with patient to assess and evaluate symptoms and progress in treatment and Medication management Principal Problem:   Alcohol use disorder Active Problems:   Insomnia   MDD (major depressive disorder), recurrent episode, severe (HCC)   Cocaine use disorder, severe, dependence (HCC)   QT prolongation   Low vitamin D level   Epilepsy, unspecified, intractable, without status epilepticus (HCC)   Nicotine  use disorder   Cannabis use disorder   Increased ammonia level   Hepatic encephalopathy (HCC)  Patient's mentation and much better today after starting lactulose and decreasing depakote , consistent with hepatic encephalopathy, suspect 2/2 EtOH + depakote . Concern for possible liver cirrhosis.  Today is last day for librium  taper. She still wants to go to rehab  # AUD, severe # Epilepsy,  intractable, unspecified # Expressive aphasia CIWA with ativan  PRN per protocol with thiamine  & MV supplement Continued librium  taper (ends 5/4) Completed depakote  taper 5/3 Continued home gabapentin  300 mg TID Continued home folic acid  daily Continued home melatonin 5 mg qPM Continued PRN remeron 7.5 mg qPM Continued campral 666 mg TID (s5/11/2023)  # Nicotine  use d/o NRTs Encouraged cessation    # Cocaine use d/o Encouraged cessation Comfort PRNs   # VitD Deficiency Continued VitD 400u supplement daily  # Hepatic encephalopathy # Elevated ammonium Continued lactulose 20 g BID (s5/11/2023) Recommend GI outpatient for cirrhosis workup Avoid hepatotoxic rx  PRNs: acetaminophen , 650 mg, Q6H PRN alum & mag hydroxide-simeth, 30 mL, Q4H PRN haloperidol , 5 mg, TID PRN  And diphenhydrAMINE , 50 mg, TID PRN haloperidol  lactate, 5 mg, TID PRN  And diphenhydrAMINE , 50 mg, TID PRN  And LORazepam , 2 mg, TID PRN haloperidol  lactate, 10 mg, TID PRN  And diphenhydrAMINE , 50 mg, TID PRN  And LORazepam , 2 mg, TID PRN magnesium  hydroxide, 30 mL, Daily PRN mirtazapine, 7.5 mg, QHS PRN     Clinical Course as of 01/12/24 1447  Sun Jan 11, 2024  0815 Ammonia(!): 57 [JN]  0816 Vitamin B12: 507 [JN]  0816 Vitamin D, 25-Hydroxy(!): 26.24 [JN]  0816 EKG 12-Lead QT/QTcB 470/499 ms [JN]    Clinical Course User Index [JN] Georges Kings, DO    Abnormal Labs Reviewed  AMMONIA - Abnormal; Notable for the following components:      Result Value   Ammonia 57 (*)    All other components within normal limits  VITAMIN D 25 HYDROXY (VIT D DEFICIENCY, FRACTURES) - Abnormal; Notable for the following components:   Vit D, 25-Hydroxy 26.24 (*)    All other components within normal limits     Silas Drivers, MD  01/12/2024 2:47 PM

## 2024-01-12 NOTE — ED Notes (Signed)
 Patient is awake and alert on unit.  She is calm at present however can be irritable and demanding.  Patient can be intrusive with mood lability.  Will monitor and provide support and set limits as needed.

## 2024-01-12 NOTE — Group Note (Signed)
 Group Topic: Emotional Regulation  Group Date: 01/12/2024 Start Time: 2030 End Time: 2100 Facilitators: Alvino Joseph, NT  Department: [facility]  Number of Participants: 9  Group Focus: coping skills Treatment Modality:  Individual Therapy Interventions utilized were assignment Purpose: enhance coping skills  Name: Theresa Rios Date of Birth: September 16, 1965  MR: [id number]    Level of Participation: active Quality of Participation: cooperative Interactions with others: gave feedback Mood/Affect: appropriate Triggers (if applicable): job,superviser,boyfriend,drink, friend Cognition: coherent/clear Progress: Moderate Response: coping- ignore them, runaway,dont talk, fight them, hide Plan: follow-up needed  Patients Problems:  Patient Active Problem List   Diagnosis Date Noted   Hepatic encephalopathy (HCC) 01/11/2024   Low vitamin D level 01/10/2024   Epilepsy, unspecified, intractable, without status epilepticus (HCC) 01/10/2024   Nicotine  use disorder 01/10/2024   Cannabis use disorder 01/10/2024   Increased ammonia level 01/10/2024   QT prolongation 01/09/2024   Cocaine use disorder, severe, dependence (HCC) 01/21/2023   Non compliance with medical treatment 01/21/2023   Housing instability 09/06/2022   MDD (major depressive disorder), recurrent, severe, with psychosis (HCC) 08/02/2022   MDD (major depressive disorder), recurrent episode, severe (HCC) 05/10/2022   Seizure (HCC) 04/11/2022   MDD (major depressive episode), single episode, severe, no psychosis (HCC) 03/21/2022   Alcohol use disorder 03/21/2022   Insomnia 03/21/2022   MDD (major depressive disorder), recurrent severe, without psychosis (HCC) 03/21/2022

## 2024-01-12 NOTE — ED Notes (Signed)
 Patient is currently sitting in the dining room watching television with other patients, patients respirations are even and unlabored, patient does not appear to be in any acute distress, will continue to monitor patient for safety.

## 2024-01-13 DIAGNOSIS — F1420 Cocaine dependence, uncomplicated: Secondary | ICD-10-CM | POA: Diagnosis not present

## 2024-01-13 DIAGNOSIS — F1010 Alcohol abuse, uncomplicated: Secondary | ICD-10-CM | POA: Diagnosis not present

## 2024-01-13 DIAGNOSIS — E559 Vitamin D deficiency, unspecified: Secondary | ICD-10-CM | POA: Diagnosis not present

## 2024-01-13 DIAGNOSIS — G40919 Epilepsy, unspecified, intractable, without status epilepticus: Secondary | ICD-10-CM | POA: Diagnosis not present

## 2024-01-13 LAB — AMMONIA: Ammonia: 14 umol/L (ref 9–35)

## 2024-01-13 MED ORDER — GABAPENTIN 300 MG PO CAPS: 300.0000 mg | ORAL_CAPSULE | Freq: Three times a day (TID) | ORAL | 0 refills | Status: DC

## 2024-01-13 MED ORDER — MIRTAZAPINE 7.5 MG PO TABS: 7.5000 mg | ORAL_TABLET | Freq: Every day | ORAL | 0 refills | Status: AC

## 2024-01-13 MED ORDER — MELATONIN 5 MG PO TABS: 5.0000 mg | ORAL_TABLET | Freq: Every day | ORAL | 0 refills | Status: AC

## 2024-01-13 MED ORDER — FOLIC ACID 1 MG PO TABS: 1.0000 mg | ORAL_TABLET | Freq: Every day | ORAL | 0 refills | Status: AC

## 2024-01-13 MED ORDER — ADULT MULTIVITAMIN W/MINERALS CH: 1.0000 | ORAL_TABLET | Freq: Every day | ORAL | 0 refills | Status: AC

## 2024-01-13 MED ORDER — CHOLECALCIFEROL 10 MCG (400 UNIT) PO TABS: 400.0000 [IU] | ORAL_TABLET | Freq: Every day | ORAL | 0 refills | Status: AC

## 2024-01-13 MED ORDER — NICOTINE 14 MG/24HR TD PT24: 14.0000 mg | MEDICATED_PATCH | Freq: Every day | TRANSDERMAL | 0 refills | Status: DC

## 2024-01-13 MED ORDER — ACAMPROSATE CALCIUM 333 MG PO TBEC: 666.0000 mg | DELAYED_RELEASE_TABLET | Freq: Three times a day (TID) | ORAL | 0 refills | Status: DC

## 2024-01-13 MED ORDER — THIAMINE HCL 100 MG PO TABS: 100.0000 mg | ORAL_TABLET | Freq: Every day | ORAL | 0 refills | Status: AC

## 2024-01-13 NOTE — Group Note (Signed)
 Group Topic: Fears and Unhealthy Coping Skills  Group Date: 01/13/2024 Start Time: 0945 End Time: 1100 Facilitators: Dennis Fitting, NT  Department: [facility]  Number of Participants: 9  Group Focus: coping skills, personal responsibility, self-awareness, and social skills Treatment Modality:  Psychoeducation Interventions utilized were clarification, exploration, problem solving, and support Purpose: enhance coping skills, explore maladaptive thinking, regain self-worth, and reinforce self-care  Name: Theresa Rios Date of Birth: [birth year]  MR: [id number]    Level of Participation: active Quality of Participation: attentive, cooperative, initiates communication, and supportive Interactions with others: gave feedback Mood/Affect: appropriate, brightens with interaction, and positive Triggers (if applicable): N/A Cognition: coherent/clear Progress: Gaining insight Response: Patient explains that when she gets angry, she physically feels it in her body. She feels hot and has pressure in her head. Patient said she drinks water and chews on ice as coping mechanisms.  Plan: patient will be encouraged to explore proactive coping skills   Patients Problems:  Patient Active Problem List   Diagnosis Date Noted   Hepatic encephalopathy (HCC) 01/11/2024   Low vitamin D level 01/10/2024   Epilepsy, unspecified, intractable, without status epilepticus (HCC) 01/10/2024   Nicotine  use disorder 01/10/2024   Cannabis use disorder 01/10/2024   Increased ammonia level 01/10/2024   QT prolongation 01/09/2024   Cocaine use disorder, severe, dependence (HCC) 01/21/2023   Non compliance with medical treatment 01/21/2023   Housing instability 09/06/2022   MDD (major depressive disorder), recurrent, severe, with psychosis (HCC) 08/02/2022   MDD (major depressive disorder), recurrent episode, severe (HCC) 05/10/2022   Seizure (HCC) 04/11/2022   MDD (major depressive  episode), single episode, severe, no psychosis (HCC) 03/21/2022   Alcohol use disorder 03/21/2022   Insomnia 03/21/2022   MDD (major depressive disorder), recurrent severe, without psychosis (HCC) 03/21/2022

## 2024-01-13 NOTE — ED Notes (Signed)
 Blood draw from (L) antecubital space x 1 introduction of needle d successful for ammonia level re-check. Patient tolerated procedure well. Patient A&O x 4, calm, cooperative. Patient reports feeling very good today, denies SI, HI, AVH. Patient appears to appreciate being addressed as Ms. Patient c/o lower back pain earlier, was administered APAP 650 mg with effective results.

## 2024-01-13 NOTE — Group Note (Signed)
 Group Topic: Social Support  Group Date: 01/13/2024 Start Time: 2030 End Time: 2100 Facilitators: Alvino Joseph, NT  Department: [facility]  Number of Participants: 8  Group Focus: individual meeting Treatment Modality:  Solution-Focused Therapy Interventions utilized were support Purpose: relapse prevention strategies  Name: Theresa Rios Date of Birth: 30-Jun-1966  MR: [id number]    Level of Participation: active Quality of Participation: cooperative Interactions with others: gave feedback Mood/Affect: appropriate Triggers (if applicable): n/a Cognition: coherent/clear Progress: Moderate Response: support- start by getting my money, getting rehabilitation,getting housing, no more drinking,no more drugs, no more violence Plan: follow-up needed  Patients Problems:  Patient Active Problem List   Diagnosis Date Noted   Hepatic encephalopathy (HCC) 01/11/2024   Low vitamin D level 01/10/2024   Epilepsy, unspecified, intractable, without status epilepticus (HCC) 01/10/2024   Nicotine  use disorder 01/10/2024   Cannabis use disorder 01/10/2024   Increased ammonia level 01/10/2024   QT prolongation 01/09/2024   Cocaine use disorder, severe, dependence (HCC) 01/21/2023   Non compliance with medical treatment 01/21/2023   Housing instability 09/06/2022   MDD (major depressive disorder), recurrent, severe, with psychosis (HCC) 08/02/2022   MDD (major depressive disorder), recurrent episode, severe (HCC) 05/10/2022   Seizure (HCC) 04/11/2022   MDD (major depressive episode), single episode, severe, no psychosis (HCC) 03/21/2022   Alcohol use disorder 03/21/2022   Insomnia 03/21/2022   MDD (major depressive disorder), recurrent severe, without psychosis (HCC) 03/21/2022

## 2024-01-13 NOTE — Group Note (Signed)
 Group Topic: Understanding Self  Group Date: 01/13/2024 Start Time: 1600 End Time: 1635 Facilitators: Ardie Kras L, RN  Department: [facility]  Number of Participants: 7  Group Focus: check in, clarity of thought, coping skills, goals/reality orientation, nursing group, and self-awareness Treatment Modality:  Psychoeducation Interventions utilized were clarification, exploration, group exercise, problem solving, and story telling Purpose: enhance coping skills, express feelings, express irrational fears, increase insight, regain self-worth, and reinforce self-care  Name: Theresa Rios Date of Birth: [birth year]  MR: [id number]    Level of Participation: active Quality of Participation: cooperative, engaged, and offered feedback Interactions with others: gave feedback Mood/Affect: appropriate and bright Triggers (if applicable): n/a Cognition: coherent/clear, goal directed, insightful, and logical Progress: Gaining insight Response: Patient answered the question she received from the grab bag openly. Patient also gave feedback and support to the other participants in the group. Plan: follow-up needed  Patients Problems:  Patient Active Problem List   Diagnosis Date Noted   Hepatic encephalopathy (HCC) 01/11/2024   Low vitamin D level 01/10/2024   Epilepsy, unspecified, intractable, without status epilepticus (HCC) 01/10/2024   Nicotine  use disorder 01/10/2024   Cannabis use disorder 01/10/2024   Increased ammonia level 01/10/2024   QT prolongation 01/09/2024   Cocaine use disorder, severe, dependence (HCC) 01/21/2023   Non compliance with medical treatment 01/21/2023   Housing instability 09/06/2022   MDD (major depressive disorder), recurrent, severe, with psychosis (HCC) 08/02/2022   MDD (major depressive disorder), recurrent episode, severe (HCC) 05/10/2022   Seizure (HCC) 04/11/2022   MDD (major depressive episode), single episode, severe,  no psychosis (HCC) 03/21/2022   Alcohol use disorder 03/21/2022   Insomnia 03/21/2022   MDD (major depressive disorder), recurrent severe, without psychosis (HCC) 03/21/2022

## 2024-01-13 NOTE — ED Notes (Signed)
 Patient is sleeping. Respirations equal and unlabored, skin warm and dry. No change in assessment or acuity. Routine safety checks conducted according to facility protocol. Will continue to monitor for safety.

## 2024-01-13 NOTE — ED Notes (Addendum)
 Pt is in her room laying her bed. Pt denies SI/HI/AVH. Pt expressed excitement about going to [facility] and asked staff to wake her up at 6am for her to shower and get ready. Snacks offered. No acute distress noted. Will continue to monitor for safety.

## 2024-01-13 NOTE — ED Notes (Signed)
 Pt is currently sleeping, no distress noted, environmental check complete, will continue to monitor patient for safety.

## 2024-01-13 NOTE — ED Provider Notes (Signed)
 Behavioral Health Progress Note  Date and Time: 01/13/2024 7:23 PM Name: Theresa Rios MRN:  [id number] CC: detox   Subjective:  Theresa Rios is a 58 y.o. 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  Case discussed with Child psychotherapist and RN on the unit. Today, Patient's ammonia level came back at 14 patient is scheduled to go to [facility] tomorrow a.m.  Depakote  has been tapered off.   Patient today denies thoughts of harming  herself or others.  She denies auditory visual hallucinations.     Review of Systems  Constitutional:  Negative for malaise/fatigue.  Respiratory:  Negative for shortness of breath.   Cardiovascular:  Negative for chest pain.  Gastrointestinal:  Negative for abdominal pain, blood in stool, diarrhea, nausea and vomiting.  Neurological:  Negative for dizziness, tremors and headaches.    Diagnosis:  Final diagnoses:  Alcohol use disorder  Cocaine use disorder, severe, dependence (HCC)  Cannabis use disorder  Nicotine  use disorder  Intractable epilepsy without status epilepticus, unspecified epilepsy type (HCC)  QT prolongation  Low vitamin D level  Increased ammonia level  Hepatic encephalopathy (HCC)    Past Psychiatric History: See H&P Past Medical History: See H&P Family History: See H&P Family Psychiatric  History: See H&P Social History: See H&P                 Current Medications:  Current Facility-Administered Medications  Medication Dose Route Frequency Provider Last Rate Last Admin   acamprosate (CAMPRAL) tablet 666 mg  666 mg Oral TID with meals Nguyen, Julie, DO   666 mg at 01/13/24 1713   acetaminophen  (TYLENOL )  tablet 650 mg  650 mg Oral Q6H PRN Brent, Amanda C, NP   650 mg at 01/13/24 0907   alum & mag hydroxide-simeth (MAALOX/MYLANTA) 200-200-20 MG/5ML suspension 30 mL  30 mL Oral Q4H PRN Davia Erps, NP   30 mL at 01/13/24 8841   cholecalciferol (VITAMIN D3) 10 MCG (400 UNIT) tablet 400 Units  400 Units Oral Daily Floyce Hutching, MD   400 Units at 01/13/24 6606   haloperidol  (HALDOL ) tablet 5 mg  5 mg Oral TID PRN Brent, Amanda C, NP       And   diphenhydrAMINE  (BENADRYL ) capsule 50 mg  50 mg Oral TID PRN Brent, Amanda C, NP       haloperidol  lactate (HALDOL ) injection 5 mg  5 mg Intramuscular TID PRN Davia Erps, NP       And   diphenhydrAMINE  (BENADRYL ) injection 50 mg  50 mg Intramuscular TID PRN Davia Erps, NP       And   LORazepam  (ATIVAN ) injection 2 mg  2 mg Intramuscular TID PRN Brent, Amanda C, NP       haloperidol  lactate (HALDOL ) injection 10 mg  10 mg Intramuscular TID PRN Brent, Amanda C, NP       And   diphenhydrAMINE  (BENADRYL ) injection 50 mg  50 mg Intramuscular TID PRN Davia Erps, NP       And   LORazepam  (ATIVAN ) injection 2 mg  2 mg Intramuscular TID PRN Brent, Amanda C, NP       folic acid  (FOLVITE ) tablet  1 mg  1 mg Oral Daily Brent, Amanda C, NP   1 mg at 01/13/24 0454   gabapentin  (NEURONTIN ) capsule 300 mg  300 mg Oral TID Floyce Hutching, MD   300 mg at 01/13/24 1536   lactulose (CHRONULAC) 10 GM/15ML solution 20 g  20 g Oral BID Nguyen, Julie, DO   20 g at 01/13/24 0981   magnesium  hydroxide (MILK OF MAGNESIA) suspension 30 mL  30 mL Oral Daily PRN Brent, Amanda C, NP       melatonin tablet 5 mg  5 mg Oral QHS Hill, Alonza Arthurs, MD   5 mg at 01/12/24 2141   mirtazapine (REMERON) tablet 7.5 mg  7.5 mg Oral QHS PRN Floyce Hutching, MD       multivitamin with minerals tablet 1 tablet  1 tablet Oral Daily Brent, Amanda C, NP   1 tablet at 01/13/24 1914   nicotine  (NICODERM CQ  - dosed in mg/24 hours) patch 14 mg  14 mg Transdermal  Daily Floyce Hutching, MD   14 mg at 01/13/24 0945   thiamine  (VITAMIN B1) injection 100 mg  100 mg Intramuscular Once Brent, Amanda C, NP       thiamine  (VITAMIN B1) tablet 100 mg  100 mg Oral QHS Nguyen, Julie, DO   100 mg at 01/12/24 2141   Current Outpatient Medications  Medication Sig Dispense Refill   acamprosate (CAMPRAL) 333 MG tablet Take 2 tablets (666 mg total) by mouth with breakfast, with lunch, and with evening meal. 180 tablet 0   [START ON 01/14/2024] cholecalciferol (VITAMIN D3) 10 MCG (400 UNIT) TABS tablet Take 1 tablet (400 Units total) by mouth daily. 30 tablet 0   folic acid  (FOLVITE ) 1 MG tablet Take 1 tablet (1 mg total) by mouth daily. 30 tablet 0   gabapentin  (NEURONTIN ) 300 MG capsule Take 1 capsule (300 mg total) by mouth 3 (three) times daily. 90 capsule 0   melatonin 5 MG TABS Take 1 tablet (5 mg total) by mouth at bedtime. 30 tablet 0   mirtazapine (REMERON) 7.5 MG tablet Take 1 tablet (7.5 mg total) by mouth at bedtime. 30 tablet 0   [START ON 01/14/2024] Multiple Vitamin (MULTIVITAMIN WITH MINERALS) TABS tablet Take 1 tablet by mouth daily. 30 tablet 0   [START ON 01/14/2024] nicotine  (NICODERM CQ  - DOSED IN MG/24 HOURS) 14 mg/24hr patch Place 1 patch (14 mg total) onto the skin daily. 28 patch 0   thiamine  (VITAMIN B1) 100 MG tablet Take 1 tablet (100 mg total) by mouth daily. 30 tablet 0   Labs  Lab Results:  Admission on 01/08/2024  Component Date Value Ref Range Status   Ammonia 01/10/2024 57 (H)  9 - 35 umol/L Final   Performed at [facility], [address], [place], [place] [address]   Vitamin B-12 01/09/2024 507  180 - 914 pg/mL Final   Comment: (NOTE) This assay is not validated for testing neonatal or myeloproliferative syndrome specimens for Vitamin B12 levels. Performed at [facility], [address] [place], [place] [address]    Vit D, 25-Hydroxy 01/09/2024 26.24 (L)  30 - 100 ng/mL Final   Comment: (NOTE) Vitamin D  deficiency has been defined by the Institute of Medicine  and an Endocrine Society practice guideline as a level of serum 25-OH  vitamin D less than 20 ng/mL (1,2). The Endocrine Society went on to  further define vitamin D insufficiency as a level between  21 and 29  ng/mL (2).  1. IOM (Institute of Medicine). 2010. Dietary reference intakes for  calcium and D. [place]: [employer]. 2. Holick MF, [place], Bischoff-Ferrari HA, et al. Evaluation,  treatment, and prevention of vitamin D deficiency: an Endocrine  Society clinical practice guideline, JCEM. 2011 Jul; 96(7): 1911-30.  Performed at [facility], [address], [place], [place] [address]    Ammonia 01/13/2024 14  9 - 35 umol/L Final   Performed at [facility], [address], [place], [place] [address]  Admission on 01/08/2024, Discharged on 01/08/2024  Component Date Value Ref Range Status   WBC 01/08/2024 10.1  4.0 - 10.5 K/uL Final   RBC 01/08/2024 4.97  3.87 - 5.11 MIL/uL Final   Hemoglobin 01/08/2024 14.4  12.0 - 15.0 g/dL Final   HCT 09/81/1914 44.6  36.0 - 46.0 % Final   MCV 01/08/2024 89.7  80.0 - 100.0 fL Final   MCH 01/08/2024 29.0  26.0 - 34.0 pg Final   MCHC 01/08/2024 32.3  30.0 - 36.0 g/dL Final   RDW 78/29/5621 14.3  11.5 - 15.5 % Final   Platelets 01/08/2024 262  150 - 400 K/uL Final   nRBC 01/08/2024 0.0  0.0 - 0.2 % Final   Neutrophils Relative % 01/08/2024 53  % Final   Neutro Abs 01/08/2024 5.3  1.7 - 7.7 K/uL Final   Lymphocytes Relative 01/08/2024 32  % Final   Lymphs Abs 01/08/2024 3.2  0.7 - 4.0 K/uL Final   Monocytes Relative 01/08/2024 14  % Final   Monocytes Absolute 01/08/2024 1.4 (H)  0.1 - 1.0 K/uL Final   Eosinophils Relative 01/08/2024 1  % Final   Eosinophils Absolute 01/08/2024 0.1  0.0 - 0.5 K/uL Final   Basophils Relative 01/08/2024 0  % Final   Basophils Absolute 01/08/2024 0.0  0.0 - 0.1 K/uL Final   Immature Granulocytes 01/08/2024 0  % Final    Abs Immature Granulocytes 01/08/2024 0.03  0.00 - 0.07 K/uL Final   Performed at [facility], [address], [place], [place] [address]   Sodium 01/08/2024 139  135 - 145 mmol/L Final   Potassium 01/08/2024 4.7  3.5 - 5.1 mmol/L Final   Chloride 01/08/2024 101  98 - 111 mmol/L Final   CO2 01/08/2024 27  22 - 32 mmol/L Final   Glucose, Bld 01/08/2024 78  70 - 99 mg/dL Final   Glucose reference range applies only to samples taken after fasting for at least 8 hours.   BUN 01/08/2024 9  6 - 20 mg/dL Final   Creatinine, Ser 01/08/2024 0.71  0.44 - 1.00 mg/dL Final   Calcium 78/46/9629 9.6  8.9 - 10.3 mg/dL Final   Total Protein 52/84/1324 6.7  6.5 - 8.1 g/dL Final   Albumin 40/06/2724 3.6  3.5 - 5.0 g/dL Final   AST 36/64/4034 25  15 - 41 U/L Final   ALT 01/08/2024 22  0 - 44 U/L Final   Alkaline Phosphatase 01/08/2024 57  38 - 126 U/L Final   Total Bilirubin 01/08/2024 0.6  0.0 - 1.2 mg/dL Final   GFR, Estimated 01/08/2024 >60  >60 mL/min Final   Comment: (NOTE) Calculated using the CKD-EPI Creatinine Equation (2021)    Anion gap 01/08/2024 11  5 - 15 Final   Performed at [facility], [address], [place], [place] [address]   Hgb A1c MFr Bld 01/08/2024 5.5  4.8 - 5.6 % Final   Comment: (NOTE) Pre diabetes:          5.7%-6.4%  Diabetes:              >6.4%  Glycemic control for   <7.0% adults with diabetes    Mean Plasma Glucose 01/08/2024 111.15  mg/dL Final   Performed at [facility], [address], [place], [place] [address]   Magnesium  01/08/2024 2.4  1.7 - 2.4 mg/dL Final   Performed at [facility], [address], [place], [place] [address]   Alcohol, Ethyl (B) 01/08/2024 <15  <15 mg/dL Final   Comment: Please note change in reference range. (NOTE) For medical purposes only. Performed at [facility], [address], [place], [place] [address]    Cholesterol 01/08/2024 214 (H)  0 - 200 mg/dL Final   Triglycerides 91/47/8295 120   <150 mg/dL Final   HDL 62/13/0865 99  >40 mg/dL Final   Total CHOL/HDL Ratio 01/08/2024 2.2  RATIO Final   VLDL 01/08/2024 24  0 - 40 mg/dL Final   LDL Cholesterol 01/08/2024 91  0 - 99 mg/dL Final   Comment:        Total Cholesterol/HDL:CHD Risk Coronary Heart Disease Risk Table                     Men   Women  1/2 Average Risk   3.4   3.3  Average Risk       5.0   4.4  2 X Average Risk   9.6   7.1  3 X Average Risk  23.4   11.0        Use the calculated Patient Ratio above and the CHD Risk Table to determine the patient's CHD Risk.        ATP III CLASSIFICATION (LDL):  <100     mg/dL   Optimal  784-696  mg/dL   Near or Above                    Optimal  130-159  mg/dL   Borderline  295-284  mg/dL   High  >132     mg/dL   Very High Performed at [facility], [address], [place], [place] [address]    TSH 01/08/2024 1.077  0.350 - 4.500 uIU/mL Final   Comment: Performed by a 3rd Generation assay with a functional sensitivity of <=0.01 uIU/mL. Performed at [facility], [address], [place], [place] [address]    POC Amphetamine UR 01/08/2024 None Detected  NONE DETECTED (Cut Off Level 1000 ng/mL) Final   POC Secobarbital (BAR) 01/08/2024 None Detected  NONE DETECTED (Cut Off Level 300 ng/mL) Final   POC Buprenorphine (BUP) 01/08/2024 None Detected  NONE DETECTED (Cut Off Level 10 ng/mL) Final   POC Oxazepam (BZO) 01/08/2024 None Detected  NONE DETECTED (Cut Off Level 300 ng/mL) Final   POC Cocaine UR 01/08/2024 Positive (A)  NONE DETECTED (Cut Off Level 300 ng/mL) Final   POC Methamphetamine UR 01/08/2024 None Detected  NONE DETECTED (Cut Off Level 1000 ng/mL) Final   POC Morphine  01/08/2024 None Detected  NONE DETECTED (Cut Off Level 300 ng/mL) Final   POC Methadone UR 01/08/2024 None Detected  NONE DETECTED (Cut Off Level 300 ng/mL) Final   POC Oxycodone UR 01/08/2024 None Detected  NONE DETECTED (Cut Off Level 100 ng/mL) Final   POC Marijuana UR 01/08/2024  Positive (A)  NONE DETECTED (Cut Off Level 50 ng/mL) Final   Valproic Acid  Lvl 01/08/2024 35 (L)  50 - 100 ug/mL Final   Performed at [facility], [address], [place], [place] [address]  Admission on 12/03/2023, Discharged on 12/04/2023  Component Date Value Ref Range Status   WBC 12/03/2023 10.1  4.0 - 10.5 K/uL Final   RBC 12/03/2023 5.15 (H)  3.87 - 5.11 MIL/uL Final   Hemoglobin 12/03/2023 14.7  12.0 - 15.0 g/dL Final   HCT 60/45/4098 46.0  36.0 - 46.0 % Final   MCV 12/03/2023 89.3  80.0 - 100.0 fL Final   MCH 12/03/2023 28.5  26.0 - 34.0 pg Final   MCHC 12/03/2023 32.0  30.0 - 36.0 g/dL Final   RDW 11/91/4782 14.6  11.5 - 15.5 % Final   Platelets 12/03/2023 210  150 - 400 K/uL Final   REPEATED TO VERIFY   nRBC 12/03/2023 0.0  0.0 - 0.2 % Final   Neutrophils Relative % 12/03/2023 72  % Final   Neutro Abs 12/03/2023 7.3  1.7 - 7.7 K/uL Final   Lymphocytes Relative 12/03/2023 19  % Final   Lymphs Abs 12/03/2023 1.9  0.7 - 4.0 K/uL Final   Monocytes Relative 12/03/2023 8  % Final   Monocytes Absolute 12/03/2023 0.8  0.1 - 1.0 K/uL Final   Eosinophils Relative 12/03/2023 1  % Final   Eosinophils Absolute 12/03/2023 0.1  0.0 - 0.5 K/uL Final   Basophils Relative 12/03/2023 0  % Final   Basophils Absolute 12/03/2023 0.0  0.0 - 0.1 K/uL Final   Immature Granulocytes 12/03/2023 0  % Final   Abs Immature Granulocytes 12/03/2023 0.03  0.00 - 0.07 K/uL Final   Performed at [facility], [address] [place], [place] [address]   Color, Urine 12/04/2023 YELLOW  YELLOW Final   APPearance 12/04/2023 CLEAR  CLEAR Final   Specific Gravity, Urine 12/04/2023 1.011  1.005 - 1.030 Final   pH 12/04/2023 6.0  5.0 - 8.0 Final   Glucose, UA 12/04/2023 NEGATIVE  NEGATIVE mg/dL Final   Hgb urine dipstick 12/04/2023 NEGATIVE  NEGATIVE Final   Bilirubin Urine 12/04/2023 NEGATIVE  NEGATIVE Final   Ketones, ur 12/04/2023 NEGATIVE  NEGATIVE mg/dL Final   Protein, ur 30/86/5784 NEGATIVE   NEGATIVE mg/dL Final   Nitrite 69/62/9528 NEGATIVE  NEGATIVE Final   Leukocytes,Ua 12/04/2023 NEGATIVE  NEGATIVE Final   Performed at [facility], [address], [place], [place] [address]   Valproic Acid  Lvl 12/03/2023 <10 (L)  50.0 - 100.0 ug/mL Final   Comment: RESULT CONFIRMED BY MANUAL DILUTION Performed at [facility], [address] [place], [place] [address]    Glucose-Capillary 12/03/2023 143 (H)  70 - 99 mg/dL Final   Glucose reference range applies only to samples taken after fasting for at least 8 hours.   Sodium 12/04/2023 141  135 - 145 mmol/L Final   Potassium 12/04/2023 4.2  3.5 - 5.1 mmol/L Final   HEMOLYSIS AT THIS LEVEL MAY AFFECT RESULT   Chloride 12/04/2023 108  98 - 111 mmol/L Final   CO2 12/04/2023 24  22 - 32 mmol/L Final   Glucose, Bld 12/04/2023 118 (H)  70 - 99 mg/dL Final   Glucose reference range applies only to samples taken after fasting for at least 8 hours.   BUN 12/04/2023 5 (L)  6 - 20 mg/dL Final   Creatinine, Ser 12/04/2023 0.74  0.44 - 1.00 mg/dL Final   Calcium 19/14/7829 9.1  8.9 - 10.3 mg/dL Final   Total Protein 56/21/3086 6.4 (L)  6.5 - 8.1 g/dL Final   Albumin 57/84/6962 3.3 (L)  3.5 - 5.0 g/dL Final   AST 95/28/4132 26  15 - 41 U/L Final   HEMOLYSIS AT THIS LEVEL MAY AFFECT RESULT   ALT 12/04/2023 17  0 - 44 U/L Final   HEMOLYSIS AT THIS LEVEL MAY AFFECT RESULT   Alkaline Phosphatase 12/04/2023 67  38 - 126 U/L Final   Total Bilirubin 12/04/2023 0.9  0.0 - 1.2 mg/dL Final   HEMOLYSIS AT THIS LEVEL MAY AFFECT RESULT   GFR, Estimated 12/04/2023 >60  >60 mL/min Final   Comment: (NOTE) Calculated using the CKD-EPI Creatinine Equation (2021)    Anion gap 12/04/2023 9  5 - 15 Final   Performed at [facility], [address] [place], [place] [address]   Magnesium  12/04/2023 2.1  1.7 - 2.4 mg/dL Final   Performed at [facility], [address], [place], [place] [address]  Office Visit on 10/28/2023  Component  Date Value Ref Range Status   Valproic Acid  Lvl 10/28/2023 47 (L)  50 - 100 ug/mL Final   Comment:                                 Detection Limit = 4                            <4 indicates None Detected Toxicity may occur at levels of 100-500. Measurements of free unbound valproic acid  may improve the assess- ment of clinical response.    Glucose 10/28/2023 105 (H)  70 - 99 mg/dL Final   BUN 66/44/0347 5 (L)  6 - 24 mg/dL Final   Creatinine, Ser 10/28/2023 0.66  0.57 - 1.00 mg/dL Final   eGFR 42/59/5638 102  >59 mL/min/1.73 Final   BUN/Creatinine Ratio 10/28/2023 8 (L)  9 - 23 Final   Sodium 10/28/2023 143  134 - 144 mmol/L Final   Potassium 10/28/2023 4.0  3.5 - 5.2 mmol/L Final   Chloride 10/28/2023 101  96 - 106 mmol/L Final   CO2 10/28/2023 21  20 - 29 mmol/L Final   Calcium 10/28/2023 9.8  8.7 - 10.2 mg/dL Final   Total Protein 75/64/3329 7.1  6.0 - 8.5 g/dL Final   Albumin 51/88/4166 4.3  3.8 - 4.9 g/dL Final   Globulin, Total 10/28/2023 2.8  1.5 - 4.5 g/dL Final   Bilirubin Total 10/28/2023 <0.2  0.0 - 1.2 mg/dL Final   Alkaline Phosphatase 10/28/2023 99  44 - 121 IU/L Final   AST 10/28/2023 19  0 - 40 IU/L Final   ALT 10/28/2023 14  0 - 32 IU/L Final  Admission on 08/30/2023, Discharged on 08/31/2023  Component Date Value Ref Range Status   WBC 08/30/2023 15.0 (H)  4.0 - 10.5 K/uL Final   RBC 08/30/2023 5.47 (H)  3.87 - 5.11 MIL/uL Final   Hemoglobin 08/30/2023 15.3 (H)  12.0 - 15.0 g/dL Final   HCT 03/08/1600 48.8 (H)  36.0 - 46.0 % Final   MCV 08/30/2023 89.2  80.0 - 100.0 fL Final   MCH 08/30/2023 28.0  26.0 - 34.0 pg Final   MCHC 08/30/2023 31.4  30.0 - 36.0 g/dL Final   RDW 09/32/3557 14.8  11.5 - 15.5 % Final   Platelets 08/30/2023 240  150 - 400 K/uL Final   nRBC 08/30/2023 0.0  0.0 - 0.2 % Final   Neutrophils Relative % 08/30/2023 71  % Final   Neutro Abs 08/30/2023 10.7 (H)  1.7 - 7.7 K/uL Final   Lymphocytes Relative 08/30/2023 20  % Final   Lymphs Abs  08/30/2023 3.0  0.7 - 4.0 K/uL Final   Monocytes Relative 08/30/2023 8  % Final   Monocytes Absolute 08/30/2023 1.2 (H)  0.1 - 1.0 K/uL Final   Eosinophils Relative 08/30/2023 0  % Final   Eosinophils Absolute 08/30/2023 0.1  0.0 - 0.5 K/uL Final   Basophils Relative 08/30/2023 0  % Final   Basophils Absolute 08/30/2023 0.0  0.0 - 0.1 K/uL Final   Immature Granulocytes 08/30/2023 1  % Final   Abs Immature Granulocytes 08/30/2023 0.07  0.00 - 0.07 K/uL Final   Performed at [facility], [address] [place], [place] [address]   Sodium 08/30/2023 135  135 - 145 mmol/L Final   Potassium 08/30/2023 3.3 (L)  3.5 - 5.1 mmol/L Final   Chloride 08/30/2023 98  98 - 111 mmol/L Final   CO2 08/30/2023 19 (L)  22 - 32 mmol/L Final   Glucose, Bld 08/30/2023 150 (H)  70 - 99 mg/dL Final   Glucose reference range applies only to samples taken after fasting for at least 8 hours.   BUN 08/30/2023 8  6 - 20 mg/dL Final   Creatinine, Ser 08/30/2023 0.82  0.44 - 1.00 mg/dL Final   Calcium 19/14/7829 8.9  8.9 - 10.3 mg/dL Final   Total Protein 56/21/3086 8.8 (H)  6.5 - 8.1 g/dL Final   Albumin 57/84/6962 4.5  3.5 - 5.0 g/dL Final   AST 95/28/4132 35  15 - 41 U/L Final   ALT 08/30/2023 22  0 - 44 U/L Final   Alkaline Phosphatase 08/30/2023 87  38 - 126 U/L Final   Total Bilirubin 08/30/2023 0.6  <1.2 mg/dL Final   GFR, Estimated 08/30/2023 >60  >60 mL/min Final   Comment: (NOTE) Calculated using the CKD-EPI Creatinine Equation (2021)    Anion gap 08/30/2023 18 (H)  5 - 15 Final   Performed at [facility], [address], [place], [place] [address]   Alcohol, Ethyl (B) 08/30/2023 <10  <10 mg/dL Final   Comment: (NOTE) Lowest detectable limit for serum alcohol is 10 mg/dL.  For medical purposes only. Performed at [facility], [address] [place], [place] [address]    Opiates 08/30/2023 NONE DETECTED  NONE DETECTED Final   Cocaine  08/30/2023 POSITIVE (A)  NONE DETECTED Final   Benzodiazepines 08/30/2023 NONE DETECTED  NONE DETECTED Final   Amphetamines 08/30/2023 NONE DETECTED  NONE DETECTED Final   Tetrahydrocannabinol 08/30/2023 NONE DETECTED  NONE DETECTED Final   Barbiturates 08/30/2023 NONE DETECTED  NONE DETECTED Final   Comment: (NOTE) DRUG SCREEN FOR MEDICAL PURPOSES ONLY.  IF CONFIRMATION IS NEEDED FOR ANY PURPOSE, NOTIFY LAB WITHIN 5 DAYS.  LOWEST DETECTABLE LIMITS FOR URINE DRUG SCREEN Drug Class                     Cutoff (ng/mL) Amphetamine and metabolites    1000 Barbiturate and metabolites    200 Benzodiazepine                 200 Opiates and metabolites  300 Cocaine and metabolites        300 THC                            50 Performed at [facility], [address], [place], [place] [address]    HIV Screen 4th Generation wRfx 08/31/2023 Non Reactive  Non Reactive Final   Performed at [facility], [address] [place], [place] [address]   Sodium 08/31/2023 136  135 - 145 mmol/L Final   Potassium 08/31/2023 3.1 (L)  3.5 - 5.1 mmol/L Final   Chloride 08/31/2023 102  98 - 111 mmol/L Final   CO2 08/31/2023 24  22 - 32 mmol/L Final   Glucose, Bld 08/31/2023 97  70 - 99 mg/dL Final   Glucose reference range applies only to samples taken after fasting for at least 8 hours.   BUN 08/31/2023 8  6 - 20 mg/dL Final   Creatinine, Ser 08/31/2023 0.52  0.44 - 1.00 mg/dL Final   Calcium 84/69/6295 9.4  8.9 - 10.3 mg/dL Final   Total Protein 28/41/3244 7.6  6.5 - 8.1 g/dL Final   Albumin 09/11/7251 3.8  3.5 - 5.0 g/dL Final   AST 66/44/0347 17  15 - 41 U/L Final   ALT 08/31/2023 15  0 - 44 U/L Final   Alkaline Phosphatase 08/31/2023 65  38 - 126 U/L Final   Total Bilirubin 08/31/2023 1.3 (H)  <1.2 mg/dL Final   GFR, Estimated 08/31/2023 >60  >60 mL/min Final   Comment: (NOTE) Calculated using the CKD-EPI Creatinine Equation (2021)    Anion gap 08/31/2023 10  5 -  15 Final   Performed at [facility], [address] [place], [place] [address]   WBC 08/31/2023 12.5 (H)  4.0 - 10.5 K/uL Final   RBC 08/31/2023 5.17 (H)  3.87 - 5.11 MIL/uL Final   Hemoglobin 08/31/2023 14.2  12.0 - 15.0 g/dL Final   HCT 63/87/5643 44.3  36.0 - 46.0 % Final   MCV 08/31/2023 85.7  80.0 - 100.0 fL Final   MCH 08/31/2023 27.5  26.0 - 34.0 pg Final   MCHC 08/31/2023 32.1  30.0 - 36.0 g/dL Final   RDW 32/95/1884 14.6  11.5 - 15.5 % Final   Platelets 08/31/2023 193  150 - 400 K/uL Final   nRBC 08/31/2023 0.0  0.0 - 0.2 % Final   Performed at [facility], [address], [place], [place] [address]   Magnesium  08/31/2023 2.5 (H)  1.7 - 2.4 mg/dL Final   Performed at [facility], [address] [place], [place] [address]    Blood Alcohol level:  Lab Results  Component Value Date   [facility] <15 01/08/2024   ETH <10 08/30/2023    Metabolic Disorder Labs: Lab Results  Component Value Date   HGBA1C 5.5 01/08/2024   MPG 111.15 01/08/2024   MPG 114 08/03/2022   No results found for: "PROLACTIN" Lab Results  Component Value Date   CHOL 214 (H) 01/08/2024   TRIG 120 01/08/2024   HDL 99 01/08/2024   CHOLHDL 2.2 01/08/2024   VLDL 24 01/08/2024   LDLCALC 91 01/08/2024   LDLCALC 130 (H) 09/01/2022    Therapeutic Lab Levels: No results found for: "LITHIUM" Lab Results  Component Value Date   VALPROATE 35 (L) 01/08/2024   VALPROATE <10 (L) 12/03/2023   No results found for: "CBMZ"  Physical Findings   AIMS    Flowsheet Row [facility]) [facility]  AIMS Total Score 0      AUDIT    Flowsheet Row ED from 01/08/2024 in [facility] Admission (Discharged) from OP Visit [facility] Ad[facility] (Discharged[facility]  Admission (Discharged) from OP Visit [facility] Ad[facility] (Discharged[facility]  Alcohol Use Disorder Identification Test Final Score (AUDIT) 40 11 11 12 11       CAGE-AID    Flowsheet Row ED to Hosp-Admission (Discharged) from 11/27/2022 in [facility] ED to Hosp-Admission (Discharged) from 04/11/2022 in [facility]  CAGE-AID Score 1 2      GAD-7    Flowsheet Row Office Visit from 04/22/2023 in [facility]  Total GAD-7 Score 21      PHQ2-9    Flowsheet Row ED from 01/08/2024 in [facility] Office Visit from 04[facility]22/2023 in [facility] Office Visit from 04/03/2022 in [facility]  PHQ-2 Total Score 6 6 0  PHQ-9 Total Score 24 27 --      Flowsheet Row ED from 01/08/2024 in [facility] Most recent reading at 01/08/2024  4:39 PM ED from 01/08/2024 in [facility] Most recent reading at 01/08/2024 10:49 AM ED from 12/03/2023 in [facility] Emergency Department at [facility] Most recent reading at 12/03/2023 11:25 PM  C-SSRS RISK CATEGORY No Risk No Risk No Risk        Musculoskeletal  Strength & Muscle Tone: within normal limited Gait & Station: normal Patient leans: NA   Psychiatric Specialty Exam  Presentation General Appearance:Appropriate for Environment, Casual, Fairly Groomed Eye Contact:Good Speech:Clear and Coherent, Normal Rate (spontaneous) Volume:Normal Handedness:Right   Mood and Affect  Mood: ("good") Affect:Appropriate, Congruent, Full Range (anxious appearing at times)   Thought Process  Thought Process:Coherent, Goal Directed, Linear Descriptions of Associations:Intact   Thought Content Suicidal Thoughts:No Homicidal  Thoughts:No Hallucinations:None Ideas of Reference:None Thought Content:Logical,   Sensorium  Memory:Immediate Good Judgment:Good Insight:Fair   Executive Functions  Orientation:Full (Time, Place and Person) Language:Good Concentration:Good Attention:Good Recall:Good Fund of Knowledge:Good   Psychomotor Activity  Psychomotor Activity:Psychomotor Activity: Normal   Assets  Assets:Communication Skills, Desire for Improvement, Resilience   Sleep  Fair  Physical Exam  Physical Exam Vitals and nursing note reviewed.  Constitutional:      General: She is not in acute distress.    Appearance: She is not ill-appearing or diaphoretic.  HENT:     Head: Normocephalic and atraumatic.  Eyes:     Conjunctiva/sclera: Conjunctivae normal.  Pulmonary:     Effort: Pulmonary effort is normal. No respiratory distress.  Neurological:     Mental Status: She is alert.     Gait: Gait normal.    Blood pressure 111/73, pulse 88, temperature 98.2 F (36.8 C), temperature source Oral, resp. rate 16, last menstrual period 05/17/2014, SpO2 100%. There is no height or weight on file to calculate BMI.  Treatment Plan Summary: Daily contact with patient to assess and evaluate symptoms and progress in treatment and Medication management Principal Problem:   Alcohol use disorder Active Problems:   Insomnia   MDD (major depressive disorder),  recurrent episode, severe (HCC)   Cocaine use disorder, severe, dependence (HCC)   QT prolongation   Low vitamin D level   Epilepsy, unspecified, intractable, without status epilepticus (HCC)   Nicotine  use disorder   Cannabis use disorder   Increased ammonia level   Hepatic encephalopathy (HCC)  Patient's mentation and much better today after starting lactulose and decreasing depakote , consistent with hepatic encephalopathy, suspect 2/2 EtOH + depakote . Concern for possible liver cirrhosis.  Today is last day for librium  taper. She still wants to go  to rehab  # AUD, severe # Epilepsy, intractable, unspecified # Expressive aphasia CIWA with ativan  PRN per protocol with thiamine  & MV supplement Continued librium  taper (ends 5/4) Completed depakote  taper 5/3 Continued home gabapentin  300 mg TID Continued home folic acid  daily Continued home melatonin 5 mg qPM Continued PRN remeron 7.5 mg qPM Continued campral 666 mg TID (s5/11/2023)  # Nicotine  use d/o NRTs Encouraged cessation   # Cocaine use d/o Encouraged cessation Comfort PRNs   # VitD Deficiency Continued VitD 400u supplement daily  # Hepatic encephalopathy # Elevated ammonium Level today is at 14 Continued lactulose 20 g BID (s5/11/2023) Recommend GI outpatient for cirrhosis workup Avoid hepatotoxic rx  PRNs: acetaminophen , 650 mg, Q6H PRN alum & mag hydroxide-simeth, 30 mL, Q4H PRN haloperidol , 5 mg, TID PRN  And diphenhydrAMINE , 50 mg, TID PRN haloperidol  lactate, 5 mg, TID PRN  And diphenhydrAMINE , 50 mg, TID PRN  And LORazepam , 2 mg, TID PRN haloperidol  lactate, 10 mg, TID PRN  And diphenhydrAMINE , 50 mg, TID PRN  And LORazepam , 2 mg, TID PRN magnesium  hydroxide, 30 mL, Daily PRN mirtazapine, 7.5 mg, QHS PRN     Ammonia level: 14, WNL  Silas Drivers, MD

## 2024-01-14 DIAGNOSIS — G40919 Epilepsy, unspecified, intractable, without status epilepticus: Secondary | ICD-10-CM | POA: Diagnosis not present

## 2024-01-14 DIAGNOSIS — F1010 Alcohol abuse, uncomplicated: Secondary | ICD-10-CM | POA: Diagnosis not present

## 2024-01-14 DIAGNOSIS — E559 Vitamin D deficiency, unspecified: Secondary | ICD-10-CM | POA: Diagnosis not present

## 2024-01-14 DIAGNOSIS — F1420 Cocaine dependence, uncomplicated: Secondary | ICD-10-CM | POA: Diagnosis not present

## 2024-01-14 NOTE — ED Notes (Signed)
 Patient is sleeping. Respirations equal and unlabored, skin warm and dry. No change in assessment or acuity. Routine safety checks conducted according to facility protocol. Will continue to monitor for safety.

## 2024-01-14 NOTE — Group Note (Signed)
 Group Topic: Balance in Life  Group Date: 01/14/2024 Start Time: 1000 End Time: 1030 Facilitators: Emelia Hang  Department: [facility]  Number of Participants: 9  Group Focus: personal responsibility Treatment Modality:  Psychoeducation Interventions utilized were support Purpose: reinforce self-care  Name: Theresa Rios Date of Birth: 08-Apr-1966  MR: [id number]    Level of Participation: active Quality of Participation: attentive Interactions with others: gave feedback Mood/Affect: appropriate Triggers (if applicable): NA Cognition: coherent/clear Progress: Moderate Response: Pt shared during the ice breaker her favorite ice cream and if she had a super power she'd want to be really fast like [employer] Flash Plan: follow-up needed  Patients Problems:  Patient Active Problem List   Diagnosis Date Noted   Hepatic encephalopathy (HCC) 01/11/2024   Low vitamin D level 01/10/2024   Epilepsy, unspecified, intractable, without status epilepticus (HCC) 01/10/2024   Nicotine  use disorder 01/10/2024   Cannabis use disorder 01/10/2024   Increased ammonia level 01/10/2024   QT prolongation 01/09/2024   Cocaine use disorder, severe, dependence (HCC) 01/21/2023   Non compliance with medical treatment 01/21/2023   Housing instability 09/06/2022   MDD (major depressive disorder), recurrent, severe, with psychosis (HCC) 08/02/2022   MDD (major depressive disorder), recurrent episode, severe (HCC) 05/10/2022   Seizure (HCC) 04/11/2022   MDD (major depressive episode), single episode, severe, no psychosis (HCC) 03/21/2022   Alcohol use disorder 03/21/2022   Insomnia 03/21/2022   MDD (major depressive disorder), recurrent severe, without psychosis (HCC) 03/21/2022

## 2024-01-14 NOTE — ED Notes (Signed)
 Patient alert & oriented x4. Denies SI, patient initially denied HI then approached nurses' station stating they "can't lie" and then endorsed HI with a plan to "choke the shit out" of a loved one whom they were trying to call. Patient reported wanting to "hurt them like they're hurting me". Patient endorsing intent on these thoughts. Provider Silas Drivers, MD made aware. Patient's planned discharge to [facility] held pending further assessment by the provider. Denies A/VH. Patient denies any physical complaints when asked. No acute distress noted. Support and encouragement provided. Routine safety checks conducted per facility protocol. Encouraged patient to notify staff if any thoughts of harm towards self or others arise. Patient verbalizes understanding and agreement.

## 2024-01-14 NOTE — ED Provider Notes (Signed)
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 Eye Contact:Good Speech:Clear and Coherent, Normal Rate (spontaneous) Volume:Normal Handedness:Right   Mood and Affect  Mood: ("good") Affect:Appropriate, Congruent, Full Range (anxious appearing at times)   Thought Process  Thought Process:Coherent, Goal Directed, Linear Descriptions of Associations:Intact   Thought Content Suicidal Thoughts:No Homicidal Thoughts:No Hallucinations:None Ideas of Reference:None Thought Content:Logical,   Sensorium  Memory:Immediate Good Judgment:Good Insight:Fair   Executive Functions  Orientation:Full (Time, Place and Person) Language:Good Concentration:Good Attention:Good Recall:Good Fund of Knowledge:Good   Psychomotor Activity  Psychomotor Activity:Psychomotor Activity: Normal   Assets  Assets:Communication Skills, Desire for Improvement, Resilience   Sleep  Fair  Physical Exam  Physical Exam Vitals and nursing note reviewed.  Constitutional:      General: She is not in acute distress.    Appearance: She is not ill-appearing or diaphoretic.  HENT:     Head: Normocephalic and atraumatic.  Eyes:     Conjunctiva/sclera: Conjunctivae normal.  Pulmonary:     Effort: Pulmonary effort is normal. No respiratory distress.  Neurological:     Mental Status: She is alert.  Gait: Gait normal.    Blood pressure 97/66, pulse 71, temperature 97.8 F (36.6 C), temperature source Oral, resp. rate 17, last menstrual period 05/17/2014, SpO2 100%. There is no height or weight on file to calculate BMI.  Treatment Plan Summary: Daily contact with patient to assess and evaluate symptoms and progress in treatment and Medication management Principal Problem:   Alcohol use disorder Active Problems:   Insomnia   MDD (major depressive disorder), recurrent episode, severe (HCC)   Cocaine use disorder, severe, dependence (HCC)   QT prolongation   Low  vitamin D level   Epilepsy, unspecified, intractable, without status epilepticus (HCC)   Nicotine  use disorder   Cannabis use disorder   Increased ammonia level   Hepatic encephalopathy (HCC)  Patient's mentation and much better today after starting lactulose and decreasing depakote , consistent with hepatic encephalopathy, suspect 2/2 EtOH + depakote . Concern for possible liver cirrhosis.  Today is last day for librium  taper. She still wants to go to rehab  # AUD, severe # Epilepsy, intractable, unspecified # Expressive aphasia CIWA with ativan  PRN per protocol with thiamine  & MV supplement Continued librium  taper (ends 5/4) Completed depakote  taper 5/3 Continued home gabapentin  300 mg TID Continued home folic acid  daily Continued home melatonin 5 mg qPM Continued PRN remeron 7.5 mg qPM Continued campral 666 mg TID (s5/11/2023)  # Nicotine  use d/o NRTs Encouraged cessation   # Cocaine use d/o Encouraged cessation Comfort PRNs   # VitD Deficiency Continued VitD 400u supplement daily  # Hepatic encephalopathy # Elevated ammonium Level today is at 14 Continued lactulose 20 g BID (s5/11/2023) Recommend GI outpatient for cirrhosis workup Avoid hepatotoxic rx  PRNs: acetaminophen , 650 mg, Q6H PRN alum & mag hydroxide-simeth, 30 mL, Q4H PRN haloperidol , 5 mg, TID PRN  And diphenhydrAMINE , 50 mg, TID PRN haloperidol  lactate, 5 mg, TID PRN  And diphenhydrAMINE , 50 mg, TID PRN  And LORazepam , 2 mg, TID PRN haloperidol  lactate, 10 mg, TID PRN  And diphenhydrAMINE , 50 mg, TID PRN  And LORazepam , 2 mg, TID PRN magnesium  hydroxide, 30 mL, Daily PRN mirtazapine, 7.5 mg, QHS PRN     Ammonia level: 14, WNL  Silas Drivers, MD

## 2024-01-14 NOTE — ED Notes (Signed)
 Patient sitting in dayroom interacting with peers. No acute distress noted. No concerns voiced. Informed patient to notify staff with any needs or assistance. Patient verbalized understanding or agreement. Safety checks in place per facility policy.

## 2024-01-14 NOTE — Discharge Planning (Signed)
 SW spoke with RN regarding phone call with patient and statement made, I'm gonna strangle him" and SW spoke with patient concerning this conversation and she stated that she was misunderstood and only made statement in a joking manner with no true meaning of this statement. SW also spoke with Moira Andrews at [facility] to confirm plans still in place with patient to DC to [facility] Thursday morning with cab to transport at 8AM.

## 2024-01-15 DIAGNOSIS — G40919 Epilepsy, unspecified, intractable, without status epilepticus: Secondary | ICD-10-CM | POA: Diagnosis not present

## 2024-01-15 DIAGNOSIS — F1010 Alcohol abuse, uncomplicated: Secondary | ICD-10-CM | POA: Diagnosis not present

## 2024-01-15 DIAGNOSIS — F1420 Cocaine dependence, uncomplicated: Secondary | ICD-10-CM | POA: Diagnosis not present

## 2024-01-15 DIAGNOSIS — E559 Vitamin D deficiency, unspecified: Secondary | ICD-10-CM | POA: Diagnosis not present

## 2024-01-15 NOTE — ED Notes (Signed)
 Patient d/c to [facility] in [place] in stable condition. Patient denies SI, HI, AVH. Patient mode of transportation is cab.

## 2024-01-15 NOTE — ED Provider Notes (Signed)
 [facility]/OBS ASAP Discharge Summary  Date and Time: 01/15/2024 9:48 AM  Name: Theresa Rios  MRN:  [id number]   Discharge Diagnoses:  Final diagnoses:  Alcohol use disorder  Cocaine use disorder, severe, dependence (HCC)  Cannabis use disorder  Nicotine  use disorder  Intractable epilepsy without status epilepticus, unspecified epilepsy type (HCC)  QT prolongation  Low vitamin D level  Increased ammonia level  Hepatic encephalopathy (HCC)   Theresa Rios is a 58 y.o. female, with PMH of  MDD, GAD, AUD, cocaine use d/o, nicotine  use d/o, inpatient psych admission (08/31/2022-09/06/2022) + suicide attempt, OSA on CPAP, non-intractable epilepsy on AED, housing instability, who presented to [facility] (01/08/2024) unaccompanied, then admitted to [facility] the same day for substance use treatment and detox    Subjective: Patient was assessed prior to discharge.  Patient denying thoughts of harming Herself or others.  She denied any psychotic symptoms.  Patient was excited to go to this program and work on her sobriety.  Stay Summary: [facility]  Patient was detoxed from alcohol using CIWA protocol and Librium  taper off.  Patient's ammonia level on 01/10/2024 came back high at 47.  Depakote  was tapered off and discontinued.  Patient was started on lactulose.  Ammonia level was repeated on 01/13/2024.  It came back at 14.  Patient was encouraged to get another ammonia level in 2 weeks.  Patient was originally scheduled to be discharged on 01/14/2024.  Reportedly in the morning patient tried to reach her family member via phone.  She was not able to reach the family member and expressed thoughts of strangling the family member because they did not answer the phone call.  Later on patient said that she did not mean to strangle them.  She was just expressing her anger.  Patient was encouraged to use healthy coping strategies and ways to express her emotions.  She agreed  to do so.  On the day of discharge patient denies any intention to harm herself or others.   Past Psychiatric History: See H&P Past Medical History: See H&P Family History: See H&P Family Psychiatric  History: See H&P Social History: See H&P  Tobacco Cessation:  A prescription for an FDA-approved tobacco cessation medication provided at discharge  Current Medications:  Current Facility-Administered Medications  Medication Dose Route Frequency Provider Last Rate Last Admin   acamprosate (CAMPRAL) tablet 666 mg  666 mg Oral TID with meals Nguyen, Julie, DO   666 mg at 01/14/24 1717   acetaminophen  (TYLENOL ) tablet 650 mg  650 mg Oral Q6H PRN Brent, Amanda C, NP   650 mg at 01/14/24 0936   alum & mag hydroxide-simeth (MAALOX/MYLANTA) 200-200-20 MG/5ML suspension 30 mL  30 mL Oral Q4H PRN Brent, Amanda C, NP   30 mL at 01/14/24 1516   cholecalciferol (VITAMIN D3) 10 MCG (400 UNIT) tablet 400 Units  400 Units Oral Daily Floyce Hutching, MD   400 Units at 01/14/24 4098   haloperidol  (HALDOL ) tablet 5 mg  5 mg Oral TID PRN Brent, Amanda C, NP       And   diphenhydrAMINE  (BENADRYL ) capsule 50 mg  50 mg Oral TID PRN Brent, Amanda C, NP       haloperidol  lactate (HALDOL ) injection 5 mg  5 mg Intramuscular TID PRN Davia Erps, NP       And   diphenhydrAMINE  (BENADRYL ) injection 50 mg  50 mg Intramuscular TID PRN Fawn Hooks,  Lavena Posner, NP       And   LORazepam  (ATIVAN ) injection 2 mg  2 mg Intramuscular TID PRN Brent, Amanda C, NP       haloperidol  lactate (HALDOL ) injection 10 mg  10 mg Intramuscular TID PRN Brent, Amanda C, NP       And   diphenhydrAMINE  (BENADRYL ) injection 50 mg  50 mg Intramuscular TID PRN Brent, Amanda C, NP       And   LORazepam  (ATIVAN ) injection 2 mg  2 mg Intramuscular TID PRN Brent, Amanda C, NP       folic acid  (FOLVITE ) tablet 1 mg  1 mg Oral Daily Brent, Amanda C, NP   1 mg at 01/14/24 2956   gabapentin  (NEURONTIN ) capsule 300 mg  300 mg Oral TID Floyce Hutching, MD   300 mg at 01/14/24 2201   magnesium  hydroxide (MILK OF MAGNESIA) suspension 30 mL  30 mL Oral Daily PRN Brent, Amanda C, NP       melatonin tablet 5 mg  5 mg Oral QHS Hill, Alonza Arthurs, MD   5 mg at 01/14/24 2201   mirtazapine (REMERON) tablet 7.5 mg  7.5 mg Oral QHS PRN Floyce Hutching, MD   7.5 mg at 01/14/24 2201   multivitamin with minerals tablet 1 tablet  1 tablet Oral Daily Brent, Amanda C, NP   1 tablet at 01/14/24 2130   nicotine  (NICODERM CQ  - dosed in mg/24 hours) patch 14 mg  14 mg Transdermal Daily Floyce Hutching, MD   14 mg at 01/14/24 8657   thiamine  (VITAMIN B1) injection 100 mg  100 mg Intramuscular Once Brent, Amanda C, NP       thiamine  (VITAMIN B1) tablet 100 mg  100 mg Oral QHS Nguyen, Julie, DO   100 mg at 01/14/24 2201   Current Outpatient Medications  Medication Sig Dispense Refill   acamprosate (CAMPRAL) 333 MG tablet Take 2 tablets (666 mg total) by mouth with breakfast, with lunch, and with evening meal. 180 tablet 0   cholecalciferol (VITAMIN D3) 10 MCG (400 UNIT) TABS tablet Take 1 tablet (400 Units total) by mouth daily. 30 tablet 0   folic acid  (FOLVITE ) 1 MG tablet Take 1 tablet (1 mg total) by mouth daily. 30 tablet 0   gabapentin  (NEURONTIN ) 300 MG capsule Take 1 capsule (300 mg total) by mouth 3 (three) times daily. 90 capsule 0   melatonin 5 MG TABS Take 1 tablet (5 mg total) by mouth at bedtime. 30 tablet 0   mirtazapine (REMERON) 7.5 MG tablet Take 1 tablet (7.5 mg total) by mouth at bedtime. 30 tablet 0   Multiple Vitamin (MULTIVITAMIN WITH MINERALS) TABS tablet Take 1 tablet by mouth daily. 30 tablet 0   nicotine  (NICODERM CQ  - DOSED IN MG/24 HOURS) 14 mg/24hr patch Place 1 patch (14 mg total) onto the skin daily. 28 patch 0   thiamine  (VITAMIN B1) 100 MG tablet Take 1 tablet (100 mg total) by mouth daily. 30 tablet 0    PTA Medications:  PTA Medications  Medication Sig   acamprosate (CAMPRAL) 333 MG tablet Take 2 tablets  (666 mg total) by mouth with breakfast, with lunch, and with evening meal.   mirtazapine (REMERON) 7.5 MG tablet Take 1 tablet (7.5 mg total) by mouth at bedtime.   nicotine  (NICODERM CQ  - DOSED IN MG/24 HOURS) 14 mg/24hr patch Place 1 patch (14 mg total) onto the skin daily.   folic acid  (FOLVITE )  1 MG tablet Take 1 tablet (1 mg total) by mouth daily.   melatonin 5 MG TABS Take 1 tablet (5 mg total) by mouth at bedtime.   gabapentin  (NEURONTIN ) 300 MG capsule Take 1 capsule (300 mg total) by mouth 3 (three) times daily.   cholecalciferol (VITAMIN D3) 10 MCG (400 UNIT) TABS tablet Take 1 tablet (400 Units total) by mouth daily.   Multiple Vitamin (MULTIVITAMIN WITH MINERALS) TABS tablet Take 1 tablet by mouth daily.   thiamine  (VITAMIN B1) 100 MG tablet Take 1 tablet (100 mg total) by mouth daily.   Facility Ordered Medications  Medication   acetaminophen  (TYLENOL ) tablet 650 mg   alum & mag hydroxide-simeth (MAALOX/MYLANTA) 200-200-20 MG/5ML suspension 30 mL   magnesium  hydroxide (MILK OF MAGNESIA) suspension 30 mL   haloperidol  (HALDOL ) tablet 5 mg   And   diphenhydrAMINE  (BENADRYL ) capsule 50 mg   haloperidol  lactate (HALDOL ) injection 5 mg   And   diphenhydrAMINE  (BENADRYL ) injection 50 mg   And   LORazepam  (ATIVAN ) injection 2 mg   haloperidol  lactate (HALDOL ) injection 10 mg   And   diphenhydrAMINE  (BENADRYL ) injection 50 mg   And   LORazepam  (ATIVAN ) injection 2 mg   folic acid  (FOLVITE ) tablet 1 mg   thiamine  (VITAMIN B1) injection 100 mg   multivitamin with minerals tablet 1 tablet   [EXPIRED] hydrOXYzine  (ATARAX ) tablet 25 mg   [EXPIRED] loperamide  (IMODIUM ) capsule 2-4 mg   [EXPIRED] ondansetron  (ZOFRAN -ODT) disintegrating tablet 4 mg   gabapentin  (NEURONTIN ) capsule 300 mg   [COMPLETED] divalproex  (DEPAKOTE ) DR tablet 250 mg   Followed by   [COMPLETED] divalproex  (DEPAKOTE ) DR tablet 125 mg   melatonin tablet 5 mg   mirtazapine (REMERON) tablet 7.5 mg   nicotine   (NICODERM CQ  - dosed in mg/24 hours) patch 14 mg   [COMPLETED] chlordiazePOXIDE  (LIBRIUM ) capsule 10 mg   Followed by   [COMPLETED] chlordiazePOXIDE  (LIBRIUM ) capsule 5 mg   cholecalciferol (VITAMIN D3) 10 MCG (400 UNIT) tablet 400 Units   thiamine  (VITAMIN B1) tablet 100 mg   acamprosate (CAMPRAL) tablet 666 mg       01/15/2024    9:47 AM 01/12/2024    2:46 PM 04/22/2023    9:11 AM  Depression screen PHQ 2/9  Decreased Interest 1 3 3   Down, Depressed, Hopeless 1 3 3   PHQ - 2 Score 2 6 6   Altered sleeping 0 3 3  Tired, decreased energy 1 2 3   Change in appetite 0 1 3  Feeling bad or failure about yourself  0 3 3  Trouble concentrating 0 3 3  Moving slowly or fidgety/restless 0 3 3  Suicidal thoughts 0 3 3  PHQ-9 Score 3 24 27   Difficult doing work/chores Not difficult at all Somewhat difficult     Flowsheet Row ED from 01/08/2024 in [facility] Most recent reading at 01/08/2024  4:39 PM ED from 01/08/2024 in [facility] Most recent reading at 01/08/2024 10:49 AM ED from 12/03/2023 in [facility] Department at [facility] Most recent reading at 12/03/2023 11:25 PM  C-SSRS RISK CATEGORY No Risk No Risk No Risk       Musculoskeletal  Strength & Muscle Tone: within normal limits Gait & Station: normal Patient leans: N/A  Psychiatric Specialty Exam  Presentation General Appearance:Appropriate for Environment, Casual, Fairly Groomed Eye Contact:Good Speech:Clear and Coherent, Normal Rate (spontaneous) Volume:Normal Handedness:Right   Mood and Affect  Mood: ("good") Affect:Appropriate, Congruent,  Full Range (anxious appearing at times)   Thought Process  Thought Process:Coherent, Goal Directed, Linear Descriptions of Associations:Intact   Thought Content Suicidal Thoughts:No Homicidal Thoughts:No Hallucinations:None Ideas of Reference:None Thought Content:Logical,   Sensorium  Memory:Immediate  Good Judgment:Good Insight:Fair   Executive Functions  Orientation:Full (Time, Place and Person) Language:Good Concentration:Good Attention:Good Recall:Good Fund of Knowledge:Good   Psychomotor Activity  Psychomotor Activity:Psychomotor Activity: Normal   Assets  Assets:Communication Skills, Desire for Improvement, Resilience   Sleep  Fair   Physical Exam  Physical Exam Vitals and nursing note reviewed.  Constitutional:      General: She is not in acute distress.    Appearance: She is not ill-appearing or diaphoretic.  HENT:     Head: Normocephalic and atraumatic.  Eyes:     Conjunctiva/sclera: Conjunctivae normal.  Pulmonary:     Effort: Pulmonary effort is normal. No respiratory distress.  Neurological:     Mental Status: She is alert.     Gait: Gait normal.    Review of Systems  Constitutional:  Negative for chills and fever.  HENT:  Negative for hearing loss and sore throat.   Eyes:  Negative for blurred vision and double vision.  Respiratory:  Negative for cough and shortness of breath.   Cardiovascular:  Negative for chest pain and palpitations.  Gastrointestinal:  Negative for nausea and vomiting.  Neurological:  Negative for dizziness and speech change.   Blood pressure 103/67, pulse 77, temperature 97.9 F (36.6 C), temperature source Oral, resp. rate 18, last menstrual period 05/17/2014, SpO2 100%. There is no height or weight on file to calculate BMI.  Demographic Factors:  Low socioeconomic status and Unemployed  Loss Factors: Decrease in vocational status and Financial problems/change in socioeconomic status  Historical Factors: Impulsivity  Risk Reduction Factors:   Religious beliefs about death and Positive therapeutic relationship  Continued Clinical Symptoms:  Alcohol/Substance Abuse/Dependencies Previous Psychiatric Diagnoses and Treatments  Cognitive Features That Contribute To Risk:  Thought constriction (tunnel vision)     Suicide Risk:  Minimal: No identifiable suicidal ideation.   Plan Of Care/Follow-up recommendations:  Activity:  As tolerated Diet:  Cardiac healthy Other:  Patient was encouraged to work on her sobriety and coping strategies.  She was encouraged to be compliant with outpatient treatment  Disposition: [facility]  Silas Drivers, MD 01/15/2024, 9:48 AM

## 2024-01-20 ENCOUNTER — Telehealth (INDEPENDENT_AMBULATORY_CARE_PROVIDER_SITE_OTHER): Payer: Self-pay | Admitting: Primary Care

## 2024-01-20 NOTE — Telephone Encounter (Signed)
 Called pt to confirm appt. Pt's spouse or significant other stated that pt had gone to rehab and will be away for the next few months. Please advise.

## 2024-01-21 ENCOUNTER — Ambulatory Visit (INDEPENDENT_AMBULATORY_CARE_PROVIDER_SITE_OTHER): Payer: MEDICAID | Admitting: Primary Care

## 2024-02-13 ENCOUNTER — Other Ambulatory Visit: Payer: Self-pay

## 2024-02-13 ENCOUNTER — Emergency Department (HOSPITAL_BASED_OUTPATIENT_CLINIC_OR_DEPARTMENT_OTHER)
Admission: EM | Admit: 2024-02-13 | Discharge: 2024-02-13 | Disposition: A | Discharge status: Home / Self Care | Payer: MEDICAID | Attending: Emergency Medicine | Admitting: Emergency Medicine

## 2024-02-13 ENCOUNTER — Encounter (HOSPITAL_BASED_OUTPATIENT_CLINIC_OR_DEPARTMENT_OTHER): Payer: Self-pay | Admitting: Emergency Medicine

## 2024-02-13 DIAGNOSIS — M79641 Pain in right hand: Secondary | ICD-10-CM

## 2024-02-13 MED ORDER — MELOXICAM 7.5 MG PO TABS: 7.5000 mg | ORAL_TABLET | Freq: Every day | ORAL | 0 refills | Status: DC

## 2024-02-13 NOTE — ED Notes (Signed)
 Pt alert and oriented X 4 at the time of discharge. RR even and unlabored. No acute distress noted. Pt verbalized understanding of discharge instructions as discussed. Pt ambulatory to lobby at time of discharge.

## 2024-02-13 NOTE — Discharge Instructions (Addendum)
 Make an appointment to follow-up with your primary care doctor.  Return to the emergency room if you have any worsening symptoms.

## 2024-02-13 NOTE — ED Provider Notes (Signed)
 [place] [facility] AT [facility] Provider Note   CSN: [id number] Arrival date & time: 02/13/24  1424     History  Chief Complaint  Patient presents with   Hand Pain    Theresa Rios is a 58 y.o. female.  Patient is a 58 year old female who presents with pain in her right hand.  She says she has had trouble with it for over a year.  She says the joints in her hand hurt.  It pretty much hurts every day.  She denies any recent injuries.  She feels like her joints get swollen and tight.  She has not seen anybody for it.  Usually at home she will eat some mustard and that seems to help.  She also wraps it up which seems to help but currently she is at [facility] for alcohol use disorder treatment and does not have access to anything.  They were not able to give her any other medications so she came here for symptomatic treatment.       Home Medications Prior to Admission medications   Medication Sig Start Date End Date Taking? Authorizing Provider  meloxicam (MOBIC) 7.5 MG tablet Take 1 tablet (7.5 mg total) by mouth daily. 02/13/24  Yes Hershel Los, MD  acamprosate  (CAMPRAL ) 333 MG tablet Take 2 tablets (666 mg total) by mouth with breakfast, with lunch, and with evening meal. 01/13/24   Silas Drivers, MD  cholecalciferol  (VITAMIN D3) 10 MCG (400 UNIT) TABS tablet Take 1 tablet (400 Units total) by mouth daily. 01/14/24   Silas Drivers, MD  folic acid  (FOLVITE ) 1 MG tablet Take 1 tablet (1 mg total) by mouth daily. 01/13/24 01/07/25  Silas Drivers, MD  gabapentin  (NEURONTIN ) 300 MG capsule Take 1 capsule (300 mg total) by mouth 3 (three) times daily. 01/13/24   Silas Drivers, MD  melatonin 5 MG TABS Take 1 tablet (5 mg total) by mouth at bedtime. 01/13/24   Silas Drivers, MD  mirtazapine  (REMERON ) 7.5 MG tablet Take 1 tablet (7.5 mg total) by mouth at bedtime. 01/13/24   Silas Drivers, MD  Multiple Vitamin (MULTIVITAMIN WITH MINERALS) TABS tablet Take 1  tablet by mouth daily. 01/14/24   Silas Drivers, MD  nicotine  (NICODERM CQ  - DOSED IN MG/24 HOURS) 14 mg/24hr patch Place 1 patch (14 mg total) onto the skin daily. 01/14/24   Silas Drivers, MD  thiamine  (VITAMIN B1) 100 MG tablet Take 1 tablet (100 mg total) by mouth daily. 01/13/24 01/07/25  Silas Drivers, MD      Allergies    Patient has no known allergies.    Review of Systems   Review of Systems  Constitutional:  Negative for fever.  Gastrointestinal:  Negative for nausea and vomiting.  Musculoskeletal:  Positive for arthralgias and joint swelling. Negative for back pain and neck pain.  Skin:  Negative for wound.  Neurological:  Negative for weakness, numbness and headaches.    Physical Exam Updated Vital Signs BP 118/78 (BP Location: Left Arm)   Pulse 82   Temp 97.6 F (36.4 C)   Resp 16   Ht 5\' 2"  (1.575 m)   Wt 71.4 kg   LMP 05/17/2014   SpO2 98%   BMI 28.79 kg/m  Physical Exam Constitutional:      Appearance: She is well-developed.  HENT:     Head: Normocephalic and atraumatic.  Cardiovascular:     Rate and Rhythm: Normal rate.  Pulmonary:     Effort: Pulmonary effort is  normal.  Musculoskeletal:        General: Tenderness present.     Cervical back: Normal range of motion and neck supple.     Comments: Patient has some mild diffuse tenderness to her right hand.  I do not appreciate any swelling to the hand.  There is no warmth or erythema.  Her joints are overall a bit enlarged which could indicate some arthritic changes but I do not see any specific joint inflammation.  No wounds.  She has normal sensation and motor function in the hand.  Radial pulses intact.  Capillary refills less than 2.  She denies other joint issues.  Skin:    General: Skin is warm and dry.  Neurological:     Mental Status: She is alert and oriented to person, place, and time.     ED Results / Procedures / Treatments   Labs (all labs ordered are listed, but only abnormal  results are displayed) Labs Reviewed - No data to display  EKG None  Radiology No results found.  Procedures Procedures    Medications Ordered in ED Medications - No data to display  ED Course/ Medical Decision Making/ A&P                                 Medical Decision Making Risk Prescription drug management.   Patient presents with right hand pain.  This has been going on for about a year.  She usually treats it at home with medicines like ibuprofen  or an Ace wrap but does not have access to anything at [facility].  I do not see any clinical concerns for septic arthritis or cellulitis.  She has not had any recent injury which would be more concerning for an underlying fracture.  She does not have other joint or muscle aches which would be more concerning for possible electrolyte abnormality/muscle spasms.  I did review her chart and she had labs about 1 month ago which were normal which is when she went in for alcohol treatment.  Will give her an Ace wrap.  Will give her a prescription for Mobic.  She is on gabapentin  already per review of her medications.  She was discharged home in good condition.  She was encouraged to follow-up with her primary care doctor when able, return precautions were given.  Final Clinical Impression(s) / ED Diagnoses Final diagnoses:  Right hand pain    Rx / DC Orders ED Discharge Orders          Ordered    meloxicam (MOBIC) 7.5 MG tablet  Daily        02/13/24 1533              Hershel Los, MD 02/13/24 1539

## 2024-02-13 NOTE — ED Triage Notes (Signed)
 Hand pain rt side for over a year is from [facility] today for ETOH has had some swelling

## 2024-02-23 ENCOUNTER — Encounter: Payer: Self-pay | Admitting: Physician Assistant

## 2024-02-23 ENCOUNTER — Ambulatory Visit: Payer: MEDICAID | Admitting: Physician Assistant

## 2024-02-23 VITALS — BP 131/81 | HR 86 | Ht 62.5 in | Wt 170.0 lb

## 2024-02-23 DIAGNOSIS — F322 Major depressive disorder, single episode, severe without psychotic features: Secondary | ICD-10-CM | POA: Diagnosis not present

## 2024-02-23 DIAGNOSIS — F1090 Alcohol use, unspecified, uncomplicated: Secondary | ICD-10-CM

## 2024-02-23 DIAGNOSIS — M65341 Trigger finger, right ring finger: Secondary | ICD-10-CM | POA: Diagnosis not present

## 2024-02-23 DIAGNOSIS — Z09 Encounter for follow-up examination after completed treatment for conditions other than malignant neoplasm: Secondary | ICD-10-CM | POA: Diagnosis not present

## 2024-02-23 MED ORDER — GABAPENTIN 300 MG PO CAPS: 300.0000 mg | ORAL_CAPSULE | Freq: Three times a day (TID) | ORAL | 1 refills | Status: DC

## 2024-02-23 MED ORDER — MELOXICAM 7.5 MG PO TABS
7.5000 mg | ORAL_TABLET | Freq: Every day | ORAL | 1 refills | Status: DC
Start: 2024-02-23 — End: 2024-04-08

## 2024-02-23 NOTE — Progress Notes (Signed)
 Patient ID: Theresa Rios, female   DOB: Feb 28, 1966, 58 y.o.   MRN: [id number]   Theresa Rios, is a 58 y.o. female  [id number]  [id number]  DOB - [birth year]  Chief Complaint  Patient presents with   Hand Pain    Right hand was swollen, and knots appeared around knuckles, unable to straighten out fingers. Patient states when she takes off bandage she feels tingling in hand.    Medication Problem    Patient has concerns on if Multi vitamin is needed.        Subjective:   Theresa Rios is a 58 y.o. female here today for continued R hand pain which has gotten some better with using an ace wrap and mbic since being seen in the ED 02/13/2024.  She has been having intermittent hand pain in R hand for about 1 year now.  Sometimes the mid hand swells.  Does not occur on the L.  No fevers.  No tick bites.  Also R ring and pinky finger won't straighten completely.  NKI  No problems updated.  ALLERGIES: Allergies  Allergen Reactions   Milk Protein Other (See Comments)    Milk intolerance, Gas     PAST MEDICAL HISTORY: Past Medical History:  Diagnosis Date   Anxiety state 03/21/2022   Cocaine use disorder, severe, dependence (HCC) 01/21/2023   Epilepsy, unspecified, intractable, without status epilepticus (HCC) 01/10/2024   Expressive aphasia 01/10/2024   Hepatic encephalopathy (HCC) 01/11/2024   Nicotine  use disorder 01/10/2024   Seizures (HCC)    Suicide attempt by drug ingestion (HCC) 03/19/2022    MEDICATIONS AT HOME: Prior to Admission medications   Medication Sig Start Date End Date Taking? Authorizing Provider  acamprosate  (CAMPRAL ) 333 MG tablet Take 2 tablets (666 mg total) by mouth with breakfast, with lunch, and with evening meal. 01/13/24  Yes Silas Drivers, MD  cholecalciferol  (VITAMIN D3) 10 MCG (400 UNIT) TABS tablet Take 1 tablet (400 Units total) by mouth daily. 01/14/24  Yes Silas Drivers, MD  folic acid  (FOLVITE ) 1 MG tablet Take 1 tablet (1 mg total) by  mouth daily. 01/13/24 01/07/25 Yes Silas Drivers, MD  melatonin 5 MG TABS Take 1 tablet (5 mg total) by mouth at bedtime. 01/13/24  Yes Silas Drivers, MD  mirtazapine  (REMERON ) 7.5 MG tablet Take 1 tablet (7.5 mg total) by mouth at bedtime. 01/13/24  Yes Silas Drivers, MD  Multiple Vitamin (MULTIVITAMIN WITH MINERALS) TABS tablet Take 1 tablet by mouth daily. 01/14/24  Yes Silas Drivers, MD  nicotine  (NICODERM CQ  - DOSED IN MG/24 HOURS) 14 mg/24hr patch Place 1 patch (14 mg total) onto the skin daily. 01/14/24  Yes Silas Drivers, MD  thiamine  (VITAMIN B1) 100 MG tablet Take 1 tablet (100 mg total) by mouth daily. 01/13/24 01/07/25 Yes Silas Drivers, MD  gabapentin  (NEURONTIN ) 300 MG capsule Take 1 capsule (300 mg total) by mouth 3 (three) times daily. 02/23/24   Hassie Lint, PA-C  meloxicam  (MOBIC ) 7.5 MG tablet Take 1 tablet (7.5 mg total) by mouth daily. Prn pain 02/23/24   Luree Palla, Stan Eans, PA-C    ROS: Neg HEENT Neg resp Neg cardiac Neg GI Neg GU Neg MS Neg psych Neg neuro  Objective:   Vitals:   02/23/24 1145  BP: 131/81  Pulse: 86  Weight: 170 lb (77.1 kg)  Height: 5' 2.5 (1.588 m)   Exam General appearance : Awake, alert, not in any distress. Speech Clear. Not toxic looking HEENT: Atraumatic and Normocephalic Neck:  Supple, no JVD. No cervical lymphadenopathy.  Chest: Good air entry bilaterally, CTAB.  No rales/rhonchi/wheezing CVS: S1 S2 regular, no murmurs.  R vs L hand examined.  Normal grip, strength and ROM on L.  No swelling.  On R she has minimal swelling over distal metacarpals.  No erythema.  No pint tenderness.   Trigger finger DIP on R ring finger and possibly starting on R pinky finger.   Neurology: Awake alert, and oriented X 3, CN II-XII intact, Non focal Skin: No Rash  Data Review Lab Results  Component Value Date   HGBA1C 5.5 01/08/2024   HGBA1C 5.6 08/03/2022   HGBA1C 5.7 (H) 03/22/2022    Assessment & Plan   1. Trigger ring finger  of right hand (Primary) - meloxicam  (MOBIC ) 7.5 MG tablet; Take 1 tablet (7.5 mg total) by mouth daily. Prn pain  Dispense: 30 tablet; Refill: 1 - Ambulatory referral to Hand Surgery  2. MDD (major depressive episode), single episode, severe, no psychosis (HCC) Being follwed by psych - gabapentin  (NEURONTIN ) 300 MG capsule; Take 1 capsule (300 mg total) by mouth 3 (three) times daily.  Dispense: 90 capsule; Refill: 1  3. Encounter for examination following treatment at hospital  4. Alcohol use disorder Currently in treatment at [facility]    Return if symptoms worsen or fail to improve.  The patient was given clear instructions to go to ER or return to medical center if symptoms don't improve, worsen or new problems develop. The patient verbalized understanding. The patient was told to call to get lab results if they haven't heard anything in the next week.      Dulce Gibbs, PA-C [facility] [place], [place] [phone]   02/23/2024, 12:08 PM

## 2024-02-23 NOTE — Patient Instructions (Signed)
Trigger Finger  Trigger finger, also called stenosing tenosynovitis, is a condition that causes a finger or thumb to get stuck in a bent position. Each finger has tough, cord-like tissue (tendon) that connects muscle to bone, and each tendon passes through a tunnel of tissue (tendon sheath). The tendon sheaths are held close to the bone by a pulley. There is a pulley called the A1 pulley that is involved in the triggering of a finger or thumb. To move your finger, your tendon needs to glide freely through the sheath. Trigger finger happens when the tendon or the sheath thickens, making it difficult to bend or straighten your finger as the thickened tendon gets stuck in the A1 pulley. Trigger finger can affect any of the fingers or the thumbs. Mild cases may clear up with rest and medicine. Severe cases require more treatment. What are the causes? Trigger finger or thumb is caused by a thickened finger tendon or tendon sheath. The cause of this thickening is not known. What increases the risk? The following factors may make you more likely to develop this condition: Doing the same movements many times (repetitive activity) that require a strong grip. Having certain health conditions. These include rheumatoid arthritis, gout, carpal tunnel syndrome, or diabetes. Being 40-60 years old. Being female. What are the signs or symptoms? Symptoms of this condition include: Pain when bending or straightening your finger. Tenderness, swelling, or a lump in the palm of your hand just below the finger joint. Hearing a noise like a pop or a snap when you try to straighten your finger. Feeling a catch or locked feeling when you try to straighten your finger. Being unable to straighten your finger without help from your other hand. How is this diagnosed? This condition is diagnosed based on your symptoms and a physical exam. How is this treated? This condition may be treated by: Resting your finger and  avoiding activities that make symptoms worse. Wearing a finger splint to keep your finger extended. Taking NSAIDs, such as ibuprofen, to relieve pain and swelling around the tendon. Doing gentle exercises to stretch the finger as told by your health care provider. Having medicine that reduces swelling and inflammation (steroids) injected into the tendon sheath. Injections may need to be repeated. Trigger finger release. This surgery is done to open the pulley. This may be done if other treatments do not work and you cannot straighten or bend your finger. You may need hand therapy after surgery. Follow these instructions at home: If you have a removable splint: Wear the splint as told by your provider. Remove it only as told by your provider. Check the skin around the splint every day. Tell your provider about any concerns. Loosen the splint if your fingers tingle, become numb, or turn cold and blue. Keep the splint clean and dry. If the splint is not waterproof: Do not let it get wet. Cover it with a watertight covering when you take a bath or shower. Managing pain, stiffness, and swelling     If told, put ice on the painful area. If you have a removable splint, remove it as told by your health care provider. Put ice in a plastic bag. Place a towel between your skin and the bag or between your splint and the bag. Leave the ice on for 20 minutes, 2-3 times a day. If told, apply heat to the affected area as often as told by your provider. Use the heat source that your provider recommends, such as   a moist heat pack or a heating pad. Place a towel between your skin and the heat source. Leave the heat on for 20-30 minutes. If your skin turns bright red, remove the ice or heat right away to prevent skin damage. The risk of damage is higher if you cannot feel pain, heat, or cold. Activity Rest your finger as told by your provider. Avoid activities that make the pain worse. Return to your  normal activities as told by your provider. Ask your provider what activities are safe for you. Do exercises as told by your provider. Ask your provider when it is safe to drive if you have a splint on your hand. General instructions Take over-the-counter and prescription medicines only as told by your provider. Keep all follow-up visits. These are needed to see how you are progressing. Contact a health care provider if: Your symptoms are not improving with home care. This information is not intended to replace advice given to you by your health care provider. Make sure you discuss any questions you have with your health care provider. Document Revised: 04/12/2022 Document Reviewed: 04/12/2022 Elsevier Patient Education  2024 Elsevier Inc.  

## 2024-03-29 ENCOUNTER — Ambulatory Visit: Payer: MEDICAID | Admitting: Physician Assistant

## 2024-03-29 DIAGNOSIS — F1920 Other psychoactive substance dependence, uncomplicated: Secondary | ICD-10-CM

## 2024-03-29 DIAGNOSIS — G40919 Epilepsy, unspecified, intractable, without status epilepticus: Secondary | ICD-10-CM | POA: Diagnosis not present

## 2024-03-29 DIAGNOSIS — Z87898 Personal history of other specified conditions: Secondary | ICD-10-CM

## 2024-03-29 DIAGNOSIS — R569 Unspecified convulsions: Secondary | ICD-10-CM

## 2024-03-29 DIAGNOSIS — E559 Vitamin D deficiency, unspecified: Secondary | ICD-10-CM

## 2024-03-29 DIAGNOSIS — F1090 Alcohol use, unspecified, uncomplicated: Secondary | ICD-10-CM

## 2024-03-29 DIAGNOSIS — F1420 Cocaine dependence, uncomplicated: Secondary | ICD-10-CM

## 2024-03-29 MED ORDER — DIVALPROEX SODIUM 500 MG PO DR TAB: 500.0000 mg | DELAYED_RELEASE_TABLET | Freq: Two times a day (BID) | ORAL | 1 refills | Status: DC

## 2024-03-29 NOTE — Progress Notes (Unsigned)
 Established Patient Office Visit  Subjective   Patient ID: Theresa Rios, female    DOB: 07-02-1966  Age: 58 y.o. MRN: [id number]  Chief Complaint  Patient presents with   Seizures    Patient has hx of seizures, she was not given her medication.    Discussed the use of AI scribe software for clinical note transcription with the patient, who gave verbal consent to proceed.  History of Present Illness  12/2021  History of Present Illness Theresa Rios is a 58 year old female with a history of seizures who presents with concerns about medication management.  She is currently at [facility] for substance abuse treatment and plans to continue with a 90-day program of classes upon discharge. She is not entering a long-term program due to family responsibilities.  She experienced a seizure last month, lasting four minutes and resulting in a three-day coma, attributed to not receiving her medication, Depakote , upon entering the facility. She is now on Depakote , 500 mg twice daily, since approximately June 27th.  She was admitted to the treatment facility on May 8th and experienced the seizure after June 16th, requiring ambulance transport to [facility]. She has been back at the facility for almost a month and is committed to completing her treatment program.  Her past medical history includes substance abuse, primarily alcohol, and she has undergone detoxification at a behavioral health facility prior to her current treatment. She plans to continue seeing her primary care provider, Ronal Caldron, after her treatment.  Physical Exam GENERAL: Alert, cooperative, well developed, no acute distress HEENT: Normocephalic, normal oropharynx, moist mucous membranes CHEST: Clear to auscultation bilaterally, no wheezes, rhonchi, or crackles CARDIOVASCULAR: Normal heart rate and rhythm, S1 and S2 normal without murmurs ABDOMEN: Soft, non-tender, non-distended, without organomegaly, normal  bowel sounds EXTREMITIES: No cyanosis or edema NEUROLOGICAL: Cranial nerves grossly intact, moves all extremities without gross motor or sensory deficit  Results LABS   Renal function: Normal (01/28/2024)   Hepatic function: Normal (01/28/2024)   Thyroid  function: Normal (01/28/2024)  Assessment and Plan Seizure disorder Resumed Depakote  500 mg BID on March 05, 2024. Ensuring therapeutic levels is crucial to prevent seizures. - Check valproic  acid level. - Ensure Depakote  refill availability.  Substance use disorder (alcohol) Undergoing treatment at [facility] for nearly 90 days. Plans to continue with 90 in 90 program post-discharge. - Continue 90 in 90 program post-discharge. - Encourage ongoing support and class attendance in [place].  General Health Maintenance Kidney and liver function tests normal two months ago. No anemia, normal thyroid  function.  Follow-up Plans to continue care with primary care provider Ronal Caldron post-discharge. - Encourage follow-up with primary care provider Ronal Caldron. - Offer alternative care options in [place] if needed.   HPI  {History (Optional):23778}  ROS    Objective:     BP 112/66 (BP Location: Left Arm, Patient Position: Sitting, Cuff Size: Large)   Pulse 84   Ht 5' 2 (1.575 m)   Wt 176 lb (79.8 kg)   LMP 05/17/2014   SpO2 97%   BMI 32.19 kg/m  {Vitals History (Optional):23777}  Physical Exam   No results found for any visits on 03/29/24.  {Labs (Optional):23779}  The 10-year ASCVD risk score (Arnett DK, et al., 2019) is: 3.9%    Assessment & Plan:   Problem List Items Addressed This Visit       Nervous and Auditory   Epilepsy, unspecified, intractable, without status epilepticus (HCC) - Primary  Other   Alcohol use disorder   Seizure (HCC)   Other Visit Diagnoses       Vitamin D  deficiency           No follow-ups on file.    Kirk RAMAN Mayers, PA-C

## 2024-03-29 NOTE — Patient Instructions (Signed)
 VISIT SUMMARY:  Today, we discussed your recent seizure and your ongoing treatment for substance abuse. You are currently taking Depakote  for your seizure disorder and are committed to completing your 90-day program at [facility]. We also reviewed your general health maintenance, including your normal kidney and liver function tests.  YOUR PLAN:  -SEIZURE DISORDER: A seizure disorder is a condition where you experience seizures, which are sudden, uncontrolled electrical disturbances in the brain. You have resumed taking Depakote  500 mg twice daily since March 05, 2024, to help prevent seizures. We will check your valproic  acid level to ensure the medication is at a therapeutic level and make sure you have enough refills of Depakote .  -SUBSTANCE USE DISORDER (ALCOHOL): Substance use disorder is a condition where the use of one or more substances leads to a clinically significant impairment or distress. You are currently undergoing treatment at [facility] and plan to continue with a 90 in 90 program after discharge. It is important to continue attending support classes and seek ongoing support in [place].

## 2024-03-30 ENCOUNTER — Ambulatory Visit: Payer: Self-pay | Admitting: Physician Assistant

## 2024-03-30 ENCOUNTER — Encounter: Payer: Self-pay | Admitting: Physician Assistant

## 2024-03-30 LAB — VALPROIC ACID LEVEL: Valproic Acid Lvl: 58 ug/mL (ref 50–100)

## 2024-04-07 ENCOUNTER — Other Ambulatory Visit: Payer: Self-pay

## 2024-04-07 ENCOUNTER — Encounter (HOSPITAL_BASED_OUTPATIENT_CLINIC_OR_DEPARTMENT_OTHER): Payer: Self-pay | Admitting: Radiology

## 2024-04-07 ENCOUNTER — Inpatient Hospital Stay (HOSPITAL_BASED_OUTPATIENT_CLINIC_OR_DEPARTMENT_OTHER)
Admission: EM | Admit: 2024-04-07 | Discharge: 2024-04-16 | DRG: 871 | Disposition: A | Discharge status: Home / Self Care | Payer: MEDICAID | Attending: Family Medicine | Admitting: Family Medicine

## 2024-04-07 ENCOUNTER — Emergency Department (HOSPITAL_BASED_OUTPATIENT_CLINIC_OR_DEPARTMENT_OTHER): Payer: MEDICAID

## 2024-04-07 DIAGNOSIS — D649 Anemia, unspecified: Secondary | ICD-10-CM | POA: Diagnosis present

## 2024-04-07 DIAGNOSIS — J9601 Acute respiratory failure with hypoxia: Principal | ICD-10-CM | POA: Diagnosis present

## 2024-04-07 DIAGNOSIS — F17210 Nicotine dependence, cigarettes, uncomplicated: Secondary | ICD-10-CM | POA: Diagnosis present

## 2024-04-07 DIAGNOSIS — R569 Unspecified convulsions: Secondary | ICD-10-CM

## 2024-04-07 DIAGNOSIS — G9341 Metabolic encephalopathy: Secondary | ICD-10-CM | POA: Diagnosis present

## 2024-04-07 DIAGNOSIS — G9340 Encephalopathy, unspecified: Secondary | ICD-10-CM

## 2024-04-07 DIAGNOSIS — D696 Thrombocytopenia, unspecified: Secondary | ICD-10-CM | POA: Diagnosis present

## 2024-04-07 DIAGNOSIS — J0100 Acute maxillary sinusitis, unspecified: Secondary | ICD-10-CM | POA: Diagnosis present

## 2024-04-07 DIAGNOSIS — Z1624 Resistance to multiple antibiotics: Secondary | ICD-10-CM | POA: Diagnosis present

## 2024-04-07 DIAGNOSIS — R6520 Severe sepsis without septic shock: Secondary | ICD-10-CM | POA: Diagnosis present

## 2024-04-07 DIAGNOSIS — N179 Acute kidney failure, unspecified: Secondary | ICD-10-CM | POA: Diagnosis present

## 2024-04-07 DIAGNOSIS — G5601 Carpal tunnel syndrome, right upper limb: Secondary | ICD-10-CM | POA: Diagnosis present

## 2024-04-07 DIAGNOSIS — Z1152 Encounter for screening for COVID-19: Secondary | ICD-10-CM

## 2024-04-07 DIAGNOSIS — A4151 Sepsis due to Escherichia coli [E. coli]: Principal | ICD-10-CM | POA: Diagnosis present

## 2024-04-07 DIAGNOSIS — I5031 Acute diastolic (congestive) heart failure: Secondary | ICD-10-CM | POA: Diagnosis present

## 2024-04-07 DIAGNOSIS — E871 Hypo-osmolality and hyponatremia: Secondary | ICD-10-CM | POA: Diagnosis present

## 2024-04-07 DIAGNOSIS — Z79899 Other long term (current) drug therapy: Secondary | ICD-10-CM

## 2024-04-07 DIAGNOSIS — F322 Major depressive disorder, single episode, severe without psychotic features: Secondary | ICD-10-CM

## 2024-04-07 DIAGNOSIS — Z9151 Personal history of suicidal behavior: Secondary | ICD-10-CM

## 2024-04-07 DIAGNOSIS — F419 Anxiety disorder, unspecified: Secondary | ICD-10-CM | POA: Diagnosis present

## 2024-04-07 DIAGNOSIS — K089 Disorder of teeth and supporting structures, unspecified: Secondary | ICD-10-CM

## 2024-04-07 DIAGNOSIS — Z22358 Carrier of other enterobacterales: Secondary | ICD-10-CM

## 2024-04-07 DIAGNOSIS — N12 Tubulo-interstitial nephritis, not specified as acute or chronic: Secondary | ICD-10-CM | POA: Diagnosis present

## 2024-04-07 DIAGNOSIS — F1010 Alcohol abuse, uncomplicated: Secondary | ICD-10-CM | POA: Diagnosis present

## 2024-04-07 DIAGNOSIS — A419 Sepsis, unspecified organism: Secondary | ICD-10-CM

## 2024-04-07 DIAGNOSIS — F32A Depression, unspecified: Secondary | ICD-10-CM | POA: Diagnosis present

## 2024-04-07 DIAGNOSIS — D509 Iron deficiency anemia, unspecified: Secondary | ICD-10-CM | POA: Diagnosis present

## 2024-04-07 DIAGNOSIS — G40909 Epilepsy, unspecified, not intractable, without status epilepticus: Secondary | ICD-10-CM | POA: Diagnosis present

## 2024-04-07 DIAGNOSIS — K047 Periapical abscess without sinus: Secondary | ICD-10-CM | POA: Diagnosis present

## 2024-04-07 DIAGNOSIS — D72829 Elevated white blood cell count, unspecified: Secondary | ICD-10-CM | POA: Diagnosis present

## 2024-04-07 DIAGNOSIS — J189 Pneumonia, unspecified organism: Secondary | ICD-10-CM | POA: Diagnosis present

## 2024-04-07 LAB — IRON AND TIBC
Iron: 9 ug/dL — ABNORMAL LOW (ref 28–170)
Saturation Ratios: 4 % — ABNORMAL LOW (ref 10.4–31.8)
TIBC: 227 ug/dL — ABNORMAL LOW (ref 250–450)
UIBC: 218 ug/dL

## 2024-04-07 LAB — OCCULT BLOOD X 1 CARD TO LAB, STOOL: Fecal Occult Bld: NEGATIVE

## 2024-04-07 LAB — COMPREHENSIVE METABOLIC PANEL WITH GFR
ALT: 12 U/L (ref 0–44)
AST: 16 U/L (ref 15–41)
Albumin: 3.1 g/dL — ABNORMAL LOW (ref 3.5–5.0)
Alkaline Phosphatase: 193 U/L — ABNORMAL HIGH (ref 38–126)
Anion gap: 12 (ref 5–15)
BUN: 39 mg/dL — ABNORMAL HIGH (ref 6–20)
CO2: 22 mmol/L (ref 22–32)
Calcium: 8.4 mg/dL — ABNORMAL LOW (ref 8.9–10.3)
Chloride: 93 mmol/L — ABNORMAL LOW (ref 98–111)
Creatinine, Ser: 2.12 mg/dL — ABNORMAL HIGH (ref 0.44–1.00)
GFR, Estimated: 26 mL/min — ABNORMAL LOW (ref >60)
Glucose, Bld: 118 mg/dL — ABNORMAL HIGH (ref 70–99)
Potassium: 3.6 mmol/L (ref 3.5–5.1)
Sodium: 127 mmol/L — ABNORMAL LOW (ref 135–145)
Total Bilirubin: 0.8 mg/dL (ref 0.0–1.2)
Total Protein: 6.2 g/dL — ABNORMAL LOW (ref 6.5–8.1)

## 2024-04-07 LAB — CBC WITH DIFFERENTIAL/PLATELET
Abs Immature Granulocytes: 1.42 K/uL — ABNORMAL HIGH (ref 0.00–0.07)
Basophils Absolute: 0.2 K/uL — ABNORMAL HIGH (ref 0.0–0.1)
Basophils Relative: 1 %
Eosinophils Absolute: 0.2 K/uL (ref 0.0–0.5)
Eosinophils Relative: 1 %
HCT: 25.7 % — ABNORMAL LOW (ref 36.0–46.0)
Hemoglobin: 8.5 g/dL — ABNORMAL LOW (ref 12.0–15.0)
Immature Granulocytes: 7 %
Lymphocytes Relative: 7 %
Lymphs Abs: 1.4 K/uL (ref 0.7–4.0)
MCH: 26.3 pg (ref 26.0–34.0)
MCHC: 33.1 g/dL (ref 30.0–36.0)
MCV: 79.6 fL — ABNORMAL LOW (ref 80.0–100.0)
Monocytes Absolute: 2.4 K/uL — ABNORMAL HIGH (ref 0.1–1.0)
Monocytes Relative: 12 %
Neutro Abs: 14.7 K/uL — ABNORMAL HIGH (ref 1.7–7.7)
Neutrophils Relative %: 72 %
Platelets: 114 K/uL — ABNORMAL LOW (ref 150–400)
RBC: 3.23 MIL/uL — ABNORMAL LOW (ref 3.87–5.11)
RDW: 14.5 % (ref 11.5–15.5)
Smear Review: NORMAL
WBC: 20.3 K/uL — ABNORMAL HIGH (ref 4.0–10.5)
nRBC: 0 % (ref 0.0–0.2)

## 2024-04-07 LAB — I-STAT VENOUS BLOOD GAS, ED
Acid-base deficit: 2 mmol/L (ref 0.0–2.0)
Bicarbonate: 23.1 mmol/L (ref 20.0–28.0)
Calcium, Ion: 1.18 mmol/L (ref 1.15–1.40)
HCT: 28 % — ABNORMAL LOW (ref 36.0–46.0)
Hemoglobin: 9.5 g/dL — ABNORMAL LOW (ref 12.0–15.0)
O2 Saturation: 83 %
Potassium: 3.5 mmol/L (ref 3.5–5.1)
Sodium: 127 mmol/L — ABNORMAL LOW (ref 135–145)
TCO2: 24 mmol/L (ref 22–32)
pCO2, Ven: 38.3 mmHg — ABNORMAL LOW (ref 44–60)
pH, Ven: 7.389 (ref 7.25–7.43)
pO2, Ven: 48 mmHg — ABNORMAL HIGH (ref 32–45)

## 2024-04-07 LAB — URINE DRUG SCREEN
Amphetamines: NOT DETECTED
Barbiturates: NOT DETECTED
Benzodiazepines: NOT DETECTED
Cocaine: NOT DETECTED
Fentanyl: NOT DETECTED
Methadone Scn, Ur: NOT DETECTED
Opiates: NOT DETECTED
Tetrahydrocannabinol: NOT DETECTED

## 2024-04-07 LAB — URINALYSIS, ROUTINE W REFLEX MICROSCOPIC
Bilirubin Urine: NEGATIVE
Glucose, UA: NEGATIVE mg/dL
Ketones, ur: NEGATIVE mg/dL
Nitrite: NEGATIVE
Protein, ur: 100 mg/dL — AB
Specific Gravity, Urine: <=1.005 (ref 1.005–1.030)
pH: 6 (ref 5.0–8.0)

## 2024-04-07 LAB — RETICULOCYTES
Immature Retic Fract: 5.1 % (ref 2.3–15.9)
RBC.: 3.27 MIL/uL — ABNORMAL LOW (ref 3.87–5.11)
Retic Count, Absolute: 14.8 K/uL — ABNORMAL LOW (ref 19.0–186.0)
Retic Ct Pct: 0.5 % (ref 0.4–3.1)

## 2024-04-07 LAB — VALPROIC ACID LEVEL: Valproic Acid Lvl: 52 ug/mL (ref 50–100)

## 2024-04-07 LAB — FOLATE: Folate: 31.9 ng/mL (ref >5.9)

## 2024-04-07 LAB — AMMONIA: Ammonia: 41 umol/L — ABNORMAL HIGH (ref 9–35)

## 2024-04-07 LAB — SARS CORONAVIRUS 2 BY RT PCR: SARS Coronavirus 2 by RT PCR: NEGATIVE

## 2024-04-07 LAB — VITAMIN B12: Vitamin B-12: 464 pg/mL (ref 180–914)

## 2024-04-07 LAB — URINALYSIS, MICROSCOPIC (REFLEX): WBC, UA: >50 WBC/hpf (ref 0–5)

## 2024-04-07 LAB — ETHANOL: Alcohol, Ethyl (B): <15 mg/dL (ref <15)

## 2024-04-07 LAB — PRO BRAIN NATRIURETIC PEPTIDE: Pro Brain Natriuretic Peptide: 5136 pg/mL — ABNORMAL HIGH (ref <300.0)

## 2024-04-07 LAB — FERRITIN: Ferritin: 238 ng/mL (ref 11–307)

## 2024-04-07 LAB — TROPONIN T, HIGH SENSITIVITY
Troponin T High Sensitivity: 16 ng/L (ref <19)
Troponin T High Sensitivity: 16 ng/L (ref <19)

## 2024-04-07 MED ORDER — ALBUTEROL SULFATE HFA 108 (90 BASE) MCG/ACT IN AERS
2.0000 | INHALATION_SPRAY | Freq: Once | RESPIRATORY_TRACT | Status: AC
  Administered 2024-04-07: 2 via RESPIRATORY_TRACT
  Filled 2024-04-07: qty 6.7

## 2024-04-07 NOTE — ED Triage Notes (Signed)
 Pt was brought here from [facility] for slurred speech and cognitive changes. Pt does endorse feeling like she is having a hard time talking and states she does endorse feeling confused. Pt endorses she has had a seizure since being at [facility] but doesn't think she has had one today. Pt states that staff say she has been slower than normal since yesterday. Pt is alert and oriented. Pt denies having access to any type of substances while at [facility].

## 2024-04-07 NOTE — ED Notes (Signed)
Butler MD at bedside 

## 2024-04-07 NOTE — ED Provider Notes (Signed)
 [place] [facility] AT [facility] Provider Note   CSN: [id number] Arrival date & time: 04/07/24  1519     Patient presents with: Shortness of Breath and Dysarthria   Theresa Rios is a 58 y.o. female.  She has a history of alcohol use, seizures, substance use, hepatic encephalopathy.  She is currently at [facility] for rehab and has been in recovery for 2 months.  She was sent in from staff there due to slurred speech and cognitively slower since yesterday.  Patient denies any complaints other than feeling a little short of breath.  She is a former smoker.  Denies any cough or chest pain headache fevers nausea vomiting.  Denies any recent substance use.  Denies any change in medications.   The history is provided by the patient.  Shortness of Breath Severity:  Moderate Onset quality:  Gradual Timing:  Intermittent Progression:  Unchanged Chronicity:  New Worsened by:  Activity Associated symptoms: no abdominal pain, no chest pain, no cough, no fever, no headaches, no hemoptysis, no rash, no sore throat, no sputum production and no vomiting        Prior to Admission medications   Medication Sig Start Date End Date Taking? Authorizing Provider  acamprosate  (CAMPRAL ) 333 MG tablet Take 2 tablets (666 mg total) by mouth with breakfast, with lunch, and with evening meal. 01/13/24   Victoria Ruts, MD  cholecalciferol  (VITAMIN D3) 10 MCG (400 UNIT) TABS tablet Take 1 tablet (400 Units total) by mouth daily. 01/14/24   Victoria Ruts, MD  divalproex  (DEPAKOTE ) 500 MG DR tablet Take 1 tablet (500 mg total) by mouth 2 (two) times daily. 03/29/24   Mayers, Cari S, PA-C  folic acid  (FOLVITE ) 1 MG tablet Take 1 tablet (1 mg total) by mouth daily. 01/13/24 01/07/25  Victoria Ruts, MD  gabapentin  (NEURONTIN ) 300 MG capsule Take 1 capsule (300 mg total) by mouth 3 (three) times daily. 02/23/24   Danton Jon HERO, PA-C  melatonin 5 MG TABS Take 1 tablet (5 mg total) by mouth  at bedtime. 01/13/24   Victoria Ruts, MD  meloxicam  (MOBIC ) 7.5 MG tablet Take 1 tablet (7.5 mg total) by mouth daily. Prn pain 02/23/24   Danton Jon HERO, PA-C  mirtazapine  (REMERON ) 7.5 MG tablet Take 1 tablet (7.5 mg total) by mouth at bedtime. 01/13/24   Victoria Ruts, MD  Multiple Vitamin (MULTIVITAMIN WITH MINERALS) TABS tablet Take 1 tablet by mouth daily. 01/14/24   Victoria Ruts, MD  nicotine  (NICODERM CQ  - DOSED IN MG/24 HOURS) 14 mg/24hr patch Place 1 patch (14 mg total) onto the skin daily. 01/14/24   Victoria Ruts, MD  thiamine  (VITAMIN B1) 100 MG tablet Take 1 tablet (100 mg total) by mouth daily. 01/13/24 01/07/25  Victoria Ruts, MD    Allergies: Milk protein    Review of Systems  Constitutional:  Negative for fever.  HENT:  Negative for sore throat.   Respiratory:  Positive for shortness of breath. Negative for cough, hemoptysis and sputum production.   Cardiovascular:  Negative for chest pain.  Gastrointestinal:  Negative for abdominal pain and vomiting.  Genitourinary:  Negative for dysuria.  Skin:  Negative for rash.  Neurological:  Negative for headaches.    Updated Vital Signs BP (!) 109/59 (BP Location: Right Arm)   Pulse (!) 111   Temp 98.7 F (37.1 C) (Oral)   Resp 19   Ht 5' 2 (1.575 m)   Wt 79.8 kg   LMP 05/17/2014  SpO2 93%   BMI 32.19 kg/m   Physical Exam Vitals and nursing note reviewed.  Constitutional:      General: She is not in acute distress.    Appearance: Normal appearance. She is well-developed.  HENT:     Head: Normocephalic and atraumatic.  Eyes:     Conjunctiva/sclera: Conjunctivae normal.  Cardiovascular:     Rate and Rhythm: Normal rate and regular rhythm.     Heart sounds: No murmur heard. Pulmonary:     Effort: Pulmonary effort is normal. No respiratory distress.     Breath sounds: Normal breath sounds. No stridor. No wheezing.  Abdominal:     Palpations: Abdomen is soft.     Tenderness: There is no abdominal  tenderness. There is no guarding or rebound.  Musculoskeletal:        General: No tenderness or deformity. Normal range of motion.     Cervical back: Neck supple.  Skin:    General: Skin is warm and dry.  Neurological:     General: No focal deficit present.     Mental Status: She is alert and oriented to person, place, and time.     GCS: GCS eye subscore is 4. GCS verbal subscore is 5. GCS motor subscore is 6.     Cranial Nerves: No cranial nerve deficit.     Sensory: No sensory deficit.     Motor: No weakness.     Gait: Gait normal.     (all labs ordered are listed, but only abnormal results are displayed) Labs Reviewed  COMPREHENSIVE METABOLIC PANEL WITH GFR - Abnormal; Notable for the following components:      Result Value   Sodium 127 (*)    Chloride 93 (*)    Glucose, Bld 118 (*)    BUN 39 (*)    Creatinine, Ser 2.12 (*)    Calcium  8.4 (*)    Total Protein 6.2 (*)    Albumin 3.1 (*)    Alkaline Phosphatase 193 (*)    GFR, Estimated 26 (*)    All other components within normal limits  CBC WITH DIFFERENTIAL/PLATELET - Abnormal; Notable for the following components:   WBC 20.3 (*)    RBC 3.23 (*)    Hemoglobin 8.5 (*)    HCT 25.7 (*)    MCV 79.6 (*)    Platelets 114 (*)    Neutro Abs 14.7 (*)    Monocytes Absolute 2.4 (*)    Basophils Absolute 0.2 (*)    Abs Immature Granulocytes 1.42 (*)    All other components within normal limits  PRO BRAIN NATRIURETIC PEPTIDE - Abnormal; Notable for the following components:   Pro Brain Natriuretic Peptide 5,136.0 (*)    All other components within normal limits  URINALYSIS, ROUTINE W REFLEX MICROSCOPIC - Abnormal; Notable for the following components:   APPearance HAZY (*)    Hgb urine dipstick MODERATE (*)    Protein, ur 100 (*)    Leukocytes,Ua SMALL (*)    All other components within normal limits  AMMONIA - Abnormal; Notable for the following components:   Ammonia 41 (*)    All other components within normal limits   IRON AND TIBC - Abnormal; Notable for the following components:   Iron 9 (*)    TIBC 227 (*)    Saturation Ratios 4 (*)    All other components within normal limits  RETICULOCYTES - Abnormal; Notable for the following components:   RBC. 3.27 (*)  Retic Count, Absolute 14.8 (*)    All other components within normal limits  URINALYSIS, MICROSCOPIC (REFLEX) - Abnormal; Notable for the following components:   Bacteria, UA FEW (*)    All other components within normal limits  I-STAT VENOUS BLOOD GAS, ED - Abnormal; Notable for the following components:   pCO2, Ven 38.3 (*)    pO2, Ven 48 (*)    Sodium 127 (*)    HCT 28.0 (*)    Hemoglobin 9.5 (*)    All other components within normal limits  SARS CORONAVIRUS 2 BY RT PCR  RESPIRATORY PANEL BY PCR  CULTURE, BLOOD (ROUTINE X 2)  CULTURE, BLOOD (ROUTINE X 2)  URINE CULTURE  ETHANOL  VALPROIC  ACID LEVEL  OCCULT BLOOD X 1 CARD TO LAB, STOOL  URINE DRUG SCREEN  VITAMIN B12  FOLATE  FERRITIN  STREP PNEUMONIAE URINARY ANTIGEN  COMPREHENSIVE METABOLIC PANEL WITH GFR  CBC  GAMMA GT  PROCALCITONIN  LEGIONELLA PNEUMOPHILA SEROGP 1 UR AG  AMMONIA  OSMOLALITY  LACTIC ACID, PLASMA  TROPONIN T, HIGH SENSITIVITY  TROPONIN T, HIGH SENSITIVITY    EKG: EKG Interpretation Date/Time:  Wednesday April 07 2024 15:32:32 EDT Ventricular Rate:  110 PR Interval:  173 QRS Duration:  81 QT Interval:  350 QTC Calculation: 474 R Axis:   -25  Text Interpretation: Sinus tachycardia Borderline left axis deviation Low voltage, precordial leads Borderline T abnormalities, inferior leads increased rate from prior 5/25 Confirmed by Towana Sharper [phone]) on 04/07/2024 3:44:12 PM  Radiology: CT Head Wo Contrast Result Date: 04/07/2024 CLINICAL DATA:  Mental status change, unknown cause EXAM: CT HEAD WITHOUT CONTRAST TECHNIQUE: Contiguous axial images were obtained from the base of the skull through the vertex without intravenous contrast. RADIATION  DOSE REDUCTION: This exam was performed according to the departmental dose-optimization program which includes automated exposure control, adjustment of the mA and/or kV according to patient size and/or use of iterative reconstruction technique. COMPARISON:  Multiple, most recently August 30, 2023 FINDINGS: Brain: The ventricles appear age appropriate. No mass effect or midline shift. Gray-white differentiation is preserved without focal attenuation abnormality.No evidence of acute territorial infarction, extra-axial fluid collection, hemorrhage, or mass lesion. The basilar cisterns are patent without downward herniation. The cerebellar hemispheres and vermis are well formed without mass lesion or focal attenuation abnormality. Vascular: No hyperdense vessel. Skull: Normal. Negative for fracture or focal lesion. Chronically depressed fracture of the right lamina papyracea. Chronic, healed fracture deformity of the left zygomatic arch. Sinuses/Orbits: Frothy secretions in the left maxillary sinus. A few air-fluid levels present in the ethmoid sinuses. The mastoids are clear.The globes appear intact. No retrobulbar hematoma. Other: None. IMPRESSION: 1. No acute intracranial abnormality, specifically, no acute hemorrhage, territorial infarction, or intracranial mass. 2. Frothy secretions in the left maxillary sinus. Correlation for possible acute sinusitis recommended. Electronically Signed   By: Rogelia Myers M.D.   On: 04/07/2024 16:51   DG Chest 1 View Result Date: 04/07/2024 CLINICAL DATA:  sob EXAM: CHEST  1 VIEW COMPARISON:  July 08, 2023 FINDINGS: Lower lung volumes. Elevation of the right hemidiaphragm. Interstitial opacities throughout both lungs. No lobar consolidation or pneumothorax. Blunting of both costophrenic sulci. Moderate cardiomegaly. Tortuous aorta. No acute fracture or destructive lesions. Multilevel thoracic osteophytosis. IMPRESSION: 1. Bilateral perihilar interstitial opacities,  likely reflecting bronchovascular crowding due to low lung volumes. Alternatively, atypical/viral infection or interstitial edema could have this appearance, in the correct clinical context. 2. Findings suggestive of trace bilateral pleural effusions. Electronically Signed  By: Rogelia Myers M.D.   On: 04/07/2024 16:07     .Critical Care  Performed by: Towana Ozell BROCKS, MD Authorized by: Towana Ozell BROCKS, MD   Critical care provider statement:    Critical care time (minutes):  45   Critical care time was exclusive of:  Separately billable procedures and treating other patients   Critical care was necessary to treat or prevent imminent or life-threatening deterioration of the following conditions:  Sepsis, respiratory failure and metabolic crisis   Critical care was time spent personally by me on the following activities:  Development of treatment plan with patient or surrogate, discussions with consultants, evaluation of patient's response to treatment, examination of patient, obtaining history from patient or surrogate, ordering and performing treatments and interventions, ordering and review of laboratory studies, ordering and review of radiographic studies, pulse oximetry and re-evaluation of patient's condition   I assumed direction of critical care for this patient from another provider in my specialty: no      Medications Ordered in the ED  acetaminophen  (TYLENOL ) tablet 650 mg (has no administration in time range)    Or  acetaminophen  (TYLENOL ) suppository 650 mg (has no administration in time range)  cefTRIAXone  (ROCEPHIN ) 1 g in sodium chloride  0.9 % 100 mL IVPB (has no administration in time range)  doxycycline  (VIBRAMYCIN ) 100 mg in sodium chloride  0.9 % 250 mL IVPB (has no administration in time range)  albuterol  (VENTOLIN  HFA) 108 (90 Base) MCG/ACT inhaler 2 puff (2 puffs Inhalation Given 04/07/24 1612)  furosemide  (LASIX ) injection 20 mg (20 mg Intravenous Given 04/08/24 0647)   cefTRIAXone  (ROCEPHIN ) 1 g in sodium chloride  0.9 % 100 mL IVPB (1 g Intravenous New Bag/Given 04/08/24 0701)    Clinical Course as of 04/08/24 0903  Wed Apr 07, 2024  1542 Oxygen saturation was mildly low at 89% and placed on 2 L of oxygen.  Mildly tachycardic here.  Has a nonfocal neuroexam. [MB]  1614 Chest x-ray showing some bibasilar haziness question effusions versus infiltrate.  Awaiting radiology reading. [MB]  1632 BNP markedly elevated no priors.  No prior cardiac echoes. [MB]  1705 Rectal exam done with nurse as chaperone.  Normal tone no masses.  Sample sent to lab for guaiac. [MB]  1741 I reached out to [facility] and let the staff know that patient was going to be admitted.  I discussed with patient and she is agreeable to admission.  Discussed with Dr. Verdene [facility]  hospitalist who will put her in for a bed. [MB]    Clinical Course User Index [MB] Towana Ozell BROCKS, MD                                 Medical Decision Making Amount and/or Complexity of Data Reviewed Labs: ordered. Radiology: ordered.  Risk Prescription drug management. Decision regarding hospitalization.   This patient complains of cough, possible slurred speech and cognitive changes; this involves an extensive number of treatment Options and is a complaint that carries with it a high risk of complications and morbidity. The differential includes stroke, intoxication, metabolic derangement, infection, hypoxia  I ordered, reviewed and interpreted labs, which included CBC with elevated white count and low hemoglobin, chemistries with low sodium elevated creatinine, troponins mildly elevated, BNP significantly elevated with no priors, VBG without significant acidosis, alcohol and tox screen negative, urinalysis possible infection I ordered medication oxygen and breathing treatments and reviewed PMP when indicated. I  ordered imaging studies which included chest x-ray head CT and I independently    visualized  and interpreted imaging which showed bibasilar opacification question edema versus infection Previous records obtained and reviewed in epic, significant prior history of alcohol use I consulted [facility]  hospitalist Dr. Verdene and discussed lab and imaging findings and discussed disposition.  Cardiac monitoring reviewed, sinus tachycardia Social determinants considered, significant including food housing transportation utilities, alcohol use tobacco use Critical Interventions: Initiation of oxygen and breathing treatments, workup  After the interventions stated above, I reevaluated the patient and found patient to be doing better on oxygen Admission and further testing considered, feel she would benefit from mission the hospital as is possibly an new onset CHF.  Also has a new anemia and new AKI and has a possible ongoing infection.  Patient in agreement with plan for admission.      Final diagnoses:  Acute respiratory failure with hypoxia (HCC)  AKI (acute kidney injury) (HCC)  Symptomatic anemia    ED Discharge Orders     None          Towana Ozell BROCKS, MD 04/08/24 [phone]

## 2024-04-08 ENCOUNTER — Encounter (HOSPITAL_COMMUNITY): Payer: Self-pay | Admitting: Internal Medicine

## 2024-04-08 ENCOUNTER — Inpatient Hospital Stay (HOSPITAL_COMMUNITY): Payer: MEDICAID

## 2024-04-08 DIAGNOSIS — I509 Heart failure, unspecified: Secondary | ICD-10-CM | POA: Diagnosis not present

## 2024-04-08 DIAGNOSIS — D696 Thrombocytopenia, unspecified: Secondary | ICD-10-CM | POA: Diagnosis present

## 2024-04-08 DIAGNOSIS — F419 Anxiety disorder, unspecified: Secondary | ICD-10-CM | POA: Diagnosis present

## 2024-04-08 DIAGNOSIS — Z9151 Personal history of suicidal behavior: Secondary | ICD-10-CM | POA: Diagnosis not present

## 2024-04-08 DIAGNOSIS — J189 Pneumonia, unspecified organism: Secondary | ICD-10-CM | POA: Diagnosis present

## 2024-04-08 DIAGNOSIS — E871 Hypo-osmolality and hyponatremia: Secondary | ICD-10-CM | POA: Diagnosis present

## 2024-04-08 DIAGNOSIS — I5031 Acute diastolic (congestive) heart failure: Secondary | ICD-10-CM | POA: Diagnosis present

## 2024-04-08 DIAGNOSIS — G5601 Carpal tunnel syndrome, right upper limb: Secondary | ICD-10-CM | POA: Diagnosis present

## 2024-04-08 DIAGNOSIS — N179 Acute kidney failure, unspecified: Secondary | ICD-10-CM | POA: Diagnosis present

## 2024-04-08 DIAGNOSIS — J0100 Acute maxillary sinusitis, unspecified: Secondary | ICD-10-CM | POA: Diagnosis present

## 2024-04-08 DIAGNOSIS — F32A Depression, unspecified: Secondary | ICD-10-CM | POA: Diagnosis present

## 2024-04-08 DIAGNOSIS — N12 Tubulo-interstitial nephritis, not specified as acute or chronic: Secondary | ICD-10-CM | POA: Diagnosis present

## 2024-04-08 DIAGNOSIS — G40909 Epilepsy, unspecified, not intractable, without status epilepticus: Secondary | ICD-10-CM | POA: Diagnosis present

## 2024-04-08 DIAGNOSIS — K089 Disorder of teeth and supporting structures, unspecified: Secondary | ICD-10-CM | POA: Diagnosis not present

## 2024-04-08 DIAGNOSIS — R6520 Severe sepsis without septic shock: Secondary | ICD-10-CM | POA: Diagnosis present

## 2024-04-08 DIAGNOSIS — Z1624 Resistance to multiple antibiotics: Secondary | ICD-10-CM | POA: Diagnosis present

## 2024-04-08 DIAGNOSIS — D72829 Elevated white blood cell count, unspecified: Secondary | ICD-10-CM | POA: Diagnosis not present

## 2024-04-08 DIAGNOSIS — J9601 Acute respiratory failure with hypoxia: Secondary | ICD-10-CM | POA: Diagnosis present

## 2024-04-08 DIAGNOSIS — F1010 Alcohol abuse, uncomplicated: Secondary | ICD-10-CM | POA: Diagnosis present

## 2024-04-08 DIAGNOSIS — R569 Unspecified convulsions: Secondary | ICD-10-CM | POA: Diagnosis not present

## 2024-04-08 DIAGNOSIS — F1090 Alcohol use, unspecified, uncomplicated: Secondary | ICD-10-CM | POA: Diagnosis not present

## 2024-04-08 DIAGNOSIS — G9341 Metabolic encephalopathy: Secondary | ICD-10-CM | POA: Diagnosis present

## 2024-04-08 DIAGNOSIS — D509 Iron deficiency anemia, unspecified: Secondary | ICD-10-CM | POA: Diagnosis present

## 2024-04-08 DIAGNOSIS — D649 Anemia, unspecified: Secondary | ICD-10-CM | POA: Diagnosis not present

## 2024-04-08 DIAGNOSIS — Z1152 Encounter for screening for COVID-19: Secondary | ICD-10-CM | POA: Diagnosis not present

## 2024-04-08 DIAGNOSIS — F17210 Nicotine dependence, cigarettes, uncomplicated: Secondary | ICD-10-CM | POA: Diagnosis present

## 2024-04-08 DIAGNOSIS — G9340 Encephalopathy, unspecified: Secondary | ICD-10-CM

## 2024-04-08 DIAGNOSIS — A4151 Sepsis due to Escherichia coli [E. coli]: Secondary | ICD-10-CM | POA: Diagnosis present

## 2024-04-08 DIAGNOSIS — A419 Sepsis, unspecified organism: Secondary | ICD-10-CM

## 2024-04-08 DIAGNOSIS — D72825 Bandemia: Secondary | ICD-10-CM | POA: Diagnosis not present

## 2024-04-08 DIAGNOSIS — K047 Periapical abscess without sinus: Secondary | ICD-10-CM | POA: Diagnosis present

## 2024-04-08 DIAGNOSIS — Z79899 Other long term (current) drug therapy: Secondary | ICD-10-CM | POA: Diagnosis not present

## 2024-04-08 LAB — ECHOCARDIOGRAM COMPLETE
AR max vel: 2.37 cm2
AV Area VTI: 2.54 cm2
AV Area mean vel: 2.4 cm2
AV Mean grad: 7 mmHg
AV Peak grad: 12.4 mmHg
Ao pk vel: 1.76 m/s
Area-P 1/2: 5.02 cm2
Height: 62 in
S' Lateral: 3.5 cm
Weight: 3086.44 [oz_av]

## 2024-04-08 LAB — COMPREHENSIVE METABOLIC PANEL WITH GFR
ALT: 12 U/L (ref 0–44)
AST: 14 U/L — ABNORMAL LOW (ref 15–41)
Albumin: 2 g/dL — ABNORMAL LOW (ref 3.5–5.0)
Alkaline Phosphatase: 165 U/L — ABNORMAL HIGH (ref 38–126)
Anion gap: 11 (ref 5–15)
BUN: 38 mg/dL — ABNORMAL HIGH (ref 6–20)
CO2: 21 mmol/L — ABNORMAL LOW (ref 22–32)
Calcium: 8.1 mg/dL — ABNORMAL LOW (ref 8.9–10.3)
Chloride: 95 mmol/L — ABNORMAL LOW (ref 98–111)
Creatinine, Ser: 2.3 mg/dL — ABNORMAL HIGH (ref 0.44–1.00)
GFR, Estimated: 24 mL/min — ABNORMAL LOW (ref >60)
Glucose, Bld: 138 mg/dL — ABNORMAL HIGH (ref 70–99)
Potassium: 3.7 mmol/L (ref 3.5–5.1)
Sodium: 127 mmol/L — ABNORMAL LOW (ref 135–145)
Total Bilirubin: 1 mg/dL (ref 0.0–1.2)
Total Protein: 5.7 g/dL — ABNORMAL LOW (ref 6.5–8.1)

## 2024-04-08 LAB — CBC
HCT: 26.4 % — ABNORMAL LOW (ref 36.0–46.0)
Hemoglobin: 8.4 g/dL — ABNORMAL LOW (ref 12.0–15.0)
MCH: 25.9 pg — ABNORMAL LOW (ref 26.0–34.0)
MCHC: 31.8 g/dL (ref 30.0–36.0)
MCV: 81.5 fL (ref 80.0–100.0)
Platelets: 116 K/uL — ABNORMAL LOW (ref 150–400)
RBC: 3.24 MIL/uL — ABNORMAL LOW (ref 3.87–5.11)
RDW: 14.6 % (ref 11.5–15.5)
WBC: 25.5 K/uL — ABNORMAL HIGH (ref 4.0–10.5)
nRBC: 0 % (ref 0.0–0.2)

## 2024-04-08 LAB — RESPIRATORY PANEL BY PCR

## 2024-04-08 LAB — PROCALCITONIN: Procalcitonin: 5.84 ng/mL

## 2024-04-08 LAB — STREP PNEUMONIAE URINARY ANTIGEN: Strep Pneumo Urinary Antigen: NEGATIVE

## 2024-04-08 LAB — OSMOLALITY: Osmolality: 280 mosm/kg (ref 275–295)

## 2024-04-08 LAB — GAMMA GT: GGT: 48 U/L (ref 7–50)

## 2024-04-08 LAB — LACTIC ACID, PLASMA: Lactic Acid, Venous: 1.1 mmol/L (ref 0.5–1.9)

## 2024-04-08 LAB — AMMONIA: Ammonia: 45 umol/L — ABNORMAL HIGH (ref 9–35)

## 2024-04-08 MED ORDER — DOXYCYCLINE HYCLATE 100 MG PO TABS: 100.0000 mg | ORAL_TABLET | Freq: Two times a day (BID) | ORAL | Status: DC

## 2024-04-08 MED ORDER — SODIUM CHLORIDE 0.9 % IV SOLN
1.0000 g | INTRAVENOUS | Status: DC
  Administered 2024-04-09 – 2024-04-10 (×2): 1 g via INTRAVENOUS
  Filled 2024-04-08 (×2): qty 10

## 2024-04-08 MED ORDER — FUROSEMIDE 10 MG/ML IJ SOLN
20.0000 mg | Freq: Once | INTRAMUSCULAR | Status: AC
  Administered 2024-04-08: 20 mg via INTRAVENOUS
  Filled 2024-04-08: qty 2

## 2024-04-08 MED ORDER — CHOLECALCIFEROL 10 MCG (400 UNIT) PO TABS
400.0000 [IU] | ORAL_TABLET | Freq: Every day | ORAL | Status: DC
  Administered 2024-04-08 – 2024-04-16 (×9): 400 [IU] via ORAL
  Filled 2024-04-08 (×9): qty 1

## 2024-04-08 MED ORDER — ACETAMINOPHEN 325 MG PO TABS
650.0000 mg | ORAL_TABLET | Freq: Four times a day (QID) | ORAL | Status: DC | PRN
  Administered 2024-04-10 – 2024-04-11 (×2): 650 mg via ORAL
  Filled 2024-04-08 (×2): qty 2

## 2024-04-08 MED ORDER — DOXYCYCLINE HYCLATE 100 MG PO TABS
100.0000 mg | ORAL_TABLET | Freq: Two times a day (BID) | ORAL | Status: DC
  Administered 2024-04-08 – 2024-04-10 (×4): 100 mg via ORAL
  Filled 2024-04-08 (×4): qty 1

## 2024-04-08 MED ORDER — ACETAMINOPHEN 650 MG RE SUPP: 650.0000 mg | Freq: Four times a day (QID) | RECTAL | Status: DC | PRN

## 2024-04-08 MED ORDER — DIVALPROEX SODIUM 500 MG PO DR TAB
500.0000 mg | DELAYED_RELEASE_TABLET | Freq: Two times a day (BID) | ORAL | Status: DC
Start: 2024-04-08 — End: 2024-04-16
  Administered 2024-04-08 – 2024-04-16 (×17): 500 mg via ORAL
  Filled 2024-04-08 (×18): qty 1

## 2024-04-08 MED ORDER — THIAMINE MONONITRATE 100 MG PO TABS
100.0000 mg | ORAL_TABLET | Freq: Every day | ORAL | Status: DC
  Administered 2024-04-08 – 2024-04-16 (×9): 100 mg via ORAL
  Filled 2024-04-08 (×16): qty 1

## 2024-04-08 MED ORDER — SODIUM CHLORIDE 0.9 % IV SOLN
1.0000 g | Freq: Once | INTRAVENOUS | Status: AC
  Administered 2024-04-08: 1 g via INTRAVENOUS
  Filled 2024-04-08: qty 10

## 2024-04-08 MED ORDER — FOLIC ACID 1 MG PO TABS
1.0000 mg | ORAL_TABLET | Freq: Every day | ORAL | Status: DC
  Administered 2024-04-08 – 2024-04-16 (×9): 1 mg via ORAL
  Filled 2024-04-08 (×9): qty 1

## 2024-04-08 MED ORDER — SODIUM CHLORIDE 0.9 % IV SOLN
100.0000 mg | Freq: Two times a day (BID) | INTRAVENOUS | Status: DC
  Administered 2024-04-08: 100 mg via INTRAVENOUS
  Filled 2024-04-08 (×2): qty 100

## 2024-04-08 NOTE — Discharge Instructions (Signed)
Outpatient Substance Use Treatment Services   [facility] [address] [place], [place] [address]  [phone] Private insurance, Medicare A&B, and GCCN   [facility]  [address]  [place], [place] [address] [phone] Medicaid, Self Pay   [facility]      [address]  [place], [place] [phone] Medicaid and Private Insurance, Self Pay   [facility] [address]  [place], [place]  [phone] Private Insurance, and Self Pay  [facility]      [address]    [place], [place] [address]  [phone] or [phone] Private Insurance Only                 [facility] [address]  [place], [place] [address] [phone] Medicaid, Medicare, Private Insurance  [facility] [address]  [place], [place] [address] [phone] Services are free or reduced  [facility]  [address] [phone]  Self Pay only, sliding scale  [facility]  [address]  [place], [place] [address] [phone] ([facility]) Self Pay, Medicaid Only   [facility] [address]  [place], [place] [address] [phone] Medicaid, Medicare, Private Insurance                     Adolescent Substance Use Treatment Services    [facility] [address]  [place], [place]  [phone] Self Pay Offer scholarships from the [employer] to help pay for treatment  Website: www.[facility].com  [facility] Males ages: 12-17 Adolescent Substance use Program Females: 12-17  [place] Office [address]  [place], [place] [address] (ph) [phone]  (fax) [phone]  [place]  Office  [address]  [place], [place] [address] (ph) [phone]  (fax) [phone]  [place] Office [address]  [place], [place] [address] (ph) [phone]  (fax) [phone]  [place] Office [address], [place], [place] [address] (ph) [phone]   (fax) [phone]  Website: https://youthhavenservices.com/          [facility] for Adolescents Phone: [phone] Address: [address], [place], [place] Website: [email or web address]/[email or web address]-[email or web address]-[email or web address]-health-[email or web address]    Residential Substance Use Treatment Services   [facility]  [address]  [place], [place] [address]  [phone] or [phone] Detox (Medicare, Medicaid, private insurance, and self pay)  Residential Rehab 14 days (Medicare, Medicaid, private insurance, and self pay)   [facility]  [address] [place], [place]  [phone]  Female and Female Detox (Self Pay and Medicaid limited availability)  Rehab only Female (Medicaid and self pay only)   [facility]      [address]  , [place] [address]  [phone] or [phone] Detox and Residential Treatment Private Insurance Only   [facility]  [address]  [place], [place] [address]  [phone]  Treatment Only, must make assessment appointment, and must be sober for assessment appointment.  Self Pay Only, Medicare A&B, [place] Medicaid, [place] ID only! *Transportation assistance offered from [employer] [address]  [facility]     [address] [place], [place] [address] Walk in interviews M-Sat 8-4p No   pending legal charges [phone]     [facility]:  [facility] Referral  [address] [place], [place] [phone] (Self Pay, Medicaid)  [facility] [address] [place], [place] [address] [phone] Detox and Residential Treatment Medicare and Private Insurance  [facility] [address]  [place], [place] [address] 28 Day Women's Facility: [phone] 28 Day Men's Facility: [phone] Long-term Residential Program:  [phone] Males 25 and Over (No Insurance, upfront fee)  [facility]  [address] [place], [place] [address] [phone] Private Insurance with [employer], Private Pay  [facility] [address] [place], [place] [address] Local [phone] Private Insurance Only  [facility] [address]  [place], [place] [address]  [phone] (Males, upfront fee)  [facility] [place] [address]  [place], [address] [phone] Private Insurance      [facility] Locations  [facility]  [address]  [place], [place]  [phone] Christian Based Program for individuals experiencing homelessness Self Pay, No insurance  [facility]  Men's program: [facility] [address]  [place], [place] [address] [phone]  [facility] Women's program: [facility] [address] [place], [place] [address] [phone] Christian Based Program for individuals experiencing homelessness Self Pay, No insurance  [facility] [address]  [place], [place] [address]  [phone] Christian Based Program for individuals experiencing homelessness Self Pay, No insurance  [facility] [address]  [place], [place] [address] [phone] Christian Based Program for individuals experiencing homelessness Self Pay, No insurance  [facility] [address] [place],  [phone] Christian Based Program for males experiencing homelessness Self Pay, No insurance 

## 2024-04-08 NOTE — Plan of Care (Signed)
 ?  Problem: Clinical Measurements: ?Goal: Respiratory complications will improve ?Outcome: Progressing ?Goal: Cardiovascular complication will be avoided ?Outcome: Progressing ?  ?Problem: Safety: ?Goal: Ability to remain free from injury will improve ?Outcome: Progressing ?  ?

## 2024-04-08 NOTE — H&P (Signed)
 History and Physical    SATORI KRABILL FMW:[id number] DOB: 1966-01-19 DOA: 04/07/2024  PCP: Scarlett Ronal Caldron, NP  Patient coming from: [facility] ED  Chief Complaint: Slurred speech, confusion  HPI: Theresa Rios is a 58 y.o. female with medical history significant of depression, anxiety, cocaine abuse, prior suicide attempt, epilepsy, expressive aphasia, hepatic encephalopathy, tobacco abuse, alcohol use disorder currently undergoing treatment at [facility] rehab.  She was sent to the ED yesterday from rehab for evaluation of slurred speech and confusion.  SpO2 89% on room air on arrival and placed on 2 L [place].  Patient tachycardic to the 110s but afebrile.  Not hypotensive.  Labs notable for WBC count 20.3, hemoglobin 8.5 (previously 14.4 on labs 3 months ago), MCV 79.6, platelet count 114k, sodium 127 (previously normal 3 months ago), chloride 93, glucose 118, BUN 39, creatinine 2.1 (baseline 0.5-0.7), alk phos 193, transaminases and T. bili normal, ethanol level <15, proBNP 5136, valproic  acid level normal, ammonia level 41, SARS-CoV-2 PCR negative, VBG with pH 7.38 and pCO2 38.3, troponin negative x 2, FOBT negative, B12 and folate normal, iron and TIBC low, ferritin normal, UDS negative.  UA with 100 protein, negative nitrate, small amount of leukocytes, and microscopy showing 21-50 RBCs, >50 WBCs, and few bacteria.  Chest x-ray showing bilateral perihilar interstitial opacities, likely reflecting bronchovascular crowding due to low lung volumes but could possibly represent atypical/viral infection or interstitial edema.  Also showing trace bilateral pleural effusions.  CT head negative for acute intracranial abnormality.  Showing frothy secretions in the left maxillary sinus.  Patient is currently AAO x 3 and answering questions but appears slightly confused.  States she has been in rehab for the past 9 months and has not used any alcohol or drugs.  She is not able to tell me why she came to the hospital.   Denies fevers, cough, shortness of breath, lower extremity edema, nausea, vomiting, abdominal pain, or diarrhea.  Reports urinary frequency and urgency which she attributes to drinking a lot of water.  Denies hematemesis, hematochezia, melena, or hematuria.  Review of Systems:  Review of Systems  All other systems reviewed and are negative.   Past Medical History:  Diagnosis Date   Anxiety state 03/21/2022   Cocaine use disorder, severe, dependence (HCC) 01/21/2023   Epilepsy, unspecified, intractable, without status epilepticus (HCC) 01/10/2024   Expressive aphasia 01/10/2024   Hepatic encephalopathy (HCC) 01/11/2024   Nicotine  use disorder 01/10/2024   Seizures (HCC)    Suicide attempt by drug ingestion (HCC) 03/19/2022    History reviewed. No pertinent surgical history.   reports that she has been smoking cigarettes. She has never used smokeless tobacco. She reports that she does not currently use alcohol after a past usage of about 10.0 standard drinks of alcohol per week. She reports that she does not currently use drugs.  Allergies  Allergen Reactions   Milk Protein Other (See Comments)    Milk intolerance, Gas     Family History  Problem Relation Age of Onset   Seizures Neg Hx     Prior to Admission medications   Medication Sig Start Date End Date Taking? Authorizing Provider  acamprosate  (CAMPRAL ) 333 MG tablet Take 2 tablets (666 mg total) by mouth with breakfast, with lunch, and with evening meal. 01/13/24   Victoria Ruts, MD  cholecalciferol  (VITAMIN D3) 10 MCG (400 UNIT) TABS tablet Take 1 tablet (400 Units total) by mouth daily. 01/14/24   Victoria Ruts, MD  divalproex  (DEPAKOTE )  500 MG DR tablet Take 1 tablet (500 mg total) by mouth 2 (two) times daily. 03/29/24   Mayers, Cari S, PA-C  folic acid  (FOLVITE ) 1 MG tablet Take 1 tablet (1 mg total) by mouth daily. 01/13/24 01/07/25  Victoria Ruts, MD  gabapentin  (NEURONTIN ) 300 MG capsule Take 1 capsule (300 mg  total) by mouth 3 (three) times daily. 02/23/24   Danton Jon HERO, PA-C  melatonin 5 MG TABS Take 1 tablet (5 mg total) by mouth at bedtime. 01/13/24   Victoria Ruts, MD  meloxicam  (MOBIC ) 7.5 MG tablet Take 1 tablet (7.5 mg total) by mouth daily. Prn pain 02/23/24   Danton Jon HERO, PA-C  mirtazapine  (REMERON ) 7.5 MG tablet Take 1 tablet (7.5 mg total) by mouth at bedtime. 01/13/24   Victoria Ruts, MD  Multiple Vitamin (MULTIVITAMIN WITH MINERALS) TABS tablet Take 1 tablet by mouth daily. 01/14/24   Victoria Ruts, MD  nicotine  (NICODERM CQ  - DOSED IN MG/24 HOURS) 14 mg/24hr patch Place 1 patch (14 mg total) onto the skin daily. 01/14/24   Victoria Ruts, MD  thiamine  (VITAMIN B1) 100 MG tablet Take 1 tablet (100 mg total) by mouth daily. 01/13/24 01/07/25  Victoria Ruts, MD    Physical Exam: Vitals:   04/07/24 2200 04/08/24 0100 04/08/24 0107 04/08/24 0251  BP: 110/82 (!) 120/95  127/84  Pulse: (!) 112 (!) 108  (!) 115  Resp: (!) 21 (!) 25  20  Temp:   98.9 F (37.2 C) 98.9 F (37.2 C)  TempSrc:   Oral Oral  SpO2: 98% 98%  94%  Weight:    87.5 kg  Height:    5' 2 (1.575 m)    Physical Exam Vitals reviewed.  Constitutional:      General: She is not in acute distress. HENT:     Head: Normocephalic and atraumatic.  Eyes:     Extraocular Movements: Extraocular movements intact.  Cardiovascular:     Rate and Rhythm: Regular rhythm. Tachycardia present.     Heart sounds: Normal heart sounds.  Pulmonary:     Effort: Pulmonary effort is normal. No respiratory distress.     Breath sounds: Normal breath sounds. No wheezing or rales.  Abdominal:     General: Bowel sounds are normal. There is no distension.     Palpations: Abdomen is soft.     Tenderness: There is no abdominal tenderness. There is no guarding.  Musculoskeletal:     Cervical back: Normal range of motion.     Comments: Trace bilateral pedal edema  Skin:    General: Skin is warm and dry.  Neurological:      General: No focal deficit present.     Mental Status: She is alert and oriented to person, place, and time.     Cranial Nerves: No cranial nerve deficit.     Sensory: No sensory deficit.     Motor: No weakness.     Comments: Following commands appropriately     Labs on Admission: I have personally reviewed following labs and imaging studies  CBC: Recent Labs  Lab 04/07/24 1539 04/07/24 1605  WBC 20.3*  --   NEUTROABS 14.7*  --   HGB 8.5* 9.5*  HCT 25.7* 28.0*  MCV 79.6*  --   PLT 114*  --    Basic Metabolic Panel: Recent Labs  Lab 04/07/24 1539 04/07/24 1605  NA 127* 127*  K 3.6 3.5  CL 93*  --   CO2 22  --  GLUCOSE 118*  --   BUN 39*  --   CREATININE 2.12*  --   CALCIUM  8.4*  --    GFR: Estimated Creatinine Clearance: 29.7 mL/min (A) (by C-G formula based on SCr of 2.12 mg/dL (H)). Liver Function Tests: Recent Labs  Lab 04/07/24 1539  AST 16  ALT 12  ALKPHOS 193*  BILITOT 0.8  PROT 6.2*  ALBUMIN 3.1*   No results for input(s): LIPASE, AMYLASE in the last 168 hours. Recent Labs  Lab 04/07/24 1541  AMMONIA 41*   Coagulation Profile: No results for input(s): INR, PROTIME in the last 168 hours. Cardiac Enzymes: No results for input(s): CKTOTAL, CKMB, CKMBINDEX, TROPONINI in the last 168 hours. BNP (last 3 results) Recent Labs    04/07/24 1539  PROBNP 5,136.0*   HbA1C: No results for input(s): HGBA1C in the last 72 hours. CBG: No results for input(s): GLUCAP in the last 168 hours. Lipid Profile: No results for input(s): CHOL, HDL, LDLCALC, TRIG, CHOLHDL, LDLDIRECT in the last 72 hours. Thyroid  Function Tests: No results for input(s): TSH, T4TOTAL, FREET4, T3FREE, THYROIDAB in the last 72 hours. Anemia Panel: Recent Labs    04/07/24 1539 04/07/24 1810  VITAMINB12  --  464  FOLATE  --  31.9  FERRITIN  --  238  TIBC  --  227*  IRON  --  9*  RETICCTPCT 0.5  --    Urine analysis:    Component  Value Date/Time   COLORURINE YELLOW 04/07/2024 2240   APPEARANCEUR HAZY (A) 04/07/2024 2240   LABSPEC <=1.005 04/07/2024 2240   PHURINE 6.0 04/07/2024 2240   GLUCOSEU NEGATIVE 04/07/2024 2240   HGBUR MODERATE (A) 04/07/2024 2240   BILIRUBINUR NEGATIVE 04/07/2024 2240   KETONESUR NEGATIVE 04/07/2024 2240   PROTEINUR 100 (A) 04/07/2024 2240   UROBILINOGEN 1.0 10/26/2008 1226   NITRITE NEGATIVE 04/07/2024 2240   LEUKOCYTESUR SMALL (A) 04/07/2024 2240    Radiological Exams on Admission: CT Head Wo Contrast Result Date: 04/07/2024 CLINICAL DATA:  Mental status change, unknown cause EXAM: CT HEAD WITHOUT CONTRAST TECHNIQUE: Contiguous axial images were obtained from the base of the skull through the vertex without intravenous contrast. RADIATION DOSE REDUCTION: This exam was performed according to the departmental dose-optimization program which includes automated exposure control, adjustment of the mA and/or kV according to patient size and/or use of iterative reconstruction technique. COMPARISON:  Multiple, most recently August 30, 2023 FINDINGS: Brain: The ventricles appear age appropriate. No mass effect or midline shift. Gray-white differentiation is preserved without focal attenuation abnormality.No evidence of acute territorial infarction, extra-axial fluid collection, hemorrhage, or mass lesion. The basilar cisterns are patent without downward herniation. The cerebellar hemispheres and vermis are well formed without mass lesion or focal attenuation abnormality. Vascular: No hyperdense vessel. Skull: Normal. Negative for fracture or focal lesion. Chronically depressed fracture of the right lamina papyracea. Chronic, healed fracture deformity of the left zygomatic arch. Sinuses/Orbits: Frothy secretions in the left maxillary sinus. A few air-fluid levels present in the ethmoid sinuses. The mastoids are clear.The globes appear intact. No retrobulbar hematoma. Other: None. IMPRESSION: 1. No acute  intracranial abnormality, specifically, no acute hemorrhage, territorial infarction, or intracranial mass. 2. Frothy secretions in the left maxillary sinus. Correlation for possible acute sinusitis recommended. Electronically Signed   By: Rogelia Myers M.D.   On: 04/07/2024 16:51   DG Chest 1 View Result Date: 04/07/2024 CLINICAL DATA:  sob EXAM: CHEST  1 VIEW COMPARISON:  July 08, 2023 FINDINGS: Lower lung volumes. Elevation  of the right hemidiaphragm. Interstitial opacities throughout both lungs. No lobar consolidation or pneumothorax. Blunting of both costophrenic sulci. Moderate cardiomegaly. Tortuous aorta. No acute fracture or destructive lesions. Multilevel thoracic osteophytosis. IMPRESSION: 1. Bilateral perihilar interstitial opacities, likely reflecting bronchovascular crowding due to low lung volumes. Alternatively, atypical/viral infection or interstitial edema could have this appearance, in the correct clinical context. 2. Findings suggestive of trace bilateral pleural effusions. Electronically Signed   By: Rogelia Myers M.D.   On: 04/07/2024 16:07    EKG: Independently reviewed.  Sinus tachycardia, borderline T wave abnormalities, QTc 474.  No acute ischemic changes.  Assessment and Plan  Sepsis secondary to possible CAP Meets SIRS criteria with tachycardia and leukocytosis. Chest x-ray showing bilateral perihilar interstitial opacities suspicious for possible pneumonia.  SARS-CoV-2 PCR negative.  Blood cultures ordered and start antibiotics including ceftriaxone  and doxycycline  (avoiding macrolides due to borderline QT prolongation and EKG).  Check MRSA PCR screen, strep pneumo/Legionella urinary antigens, and procalcitonin level.  Trend WBC count and check lactate.  New onset CHF No documented history of CHF or previous echo results in the chart.  proBNP 5136 and chest x-ray with possible pulmonary edema.  Blood pressure stable.  IV Lasix  20 mg x 1 and echocardiogram ordered.   Reassess in the morning and order additional doses of Lasix  accordingly.  Monitor intake and output, daily weights, renal function.  Acute hypoxemic respiratory failure Likely multifactorial from pneumonia and acute CHF.  SPO2 89% on room air, currently stable on 2 L [place].  VBG without evidence of hypercarbia.  AKI ?Pre-renal vs cardiorenal in the setting of acute CHF.  Creatinine 2.1 (baseline 0.5-0.7).  A dose of IV Lasix  given for diuresis and repeat labs to check renal function.  Renal ultrasound ordered.  Acute encephalopathy CT head negative for acute intracranial abnormality and no focal neurodeficit on exam.  She is on Depakote  due to known history of seizure disorder and valproic  acid level within normal range.  ?Hepatic encephalopathy but ammonia only borderline elevated, continue to trend.  UDS negative.  UA with negative nitrite, small amount of leukocytes, and microscopy with >50 WBCs and few bacteria. ?UTI as patient is endorsing urinary frequency and urgency.  Continue ceftriaxone  and urine culture ordered.  Microcytic anemia Hemoglobin 8.5 (previously 14.4 on labs 3 months ago).  Iron and TIBC low with normal ferritin suspicious for likely anemia of chronic disease.  No overt bleeding and FOBT negative.  Monitor H&H and transfuse PRBCs if hemoglobin is less than 7.  Mild thrombocytopenia No signs of bleeding, monitor platelet count.  Hyponatremia Suspect this is due to hypervolemia given elevated proBNP.  Sodium 127, previously normal 3 months ago.  A dose of IV Lasix  given for diuresis.  Check serum osmolarity and repeat labs to check sodium level.  Elevated alkaline phosphatase Transaminases and T. bili normal.  Check GGT.  Seizure disorder Continue Depakote  after pharmacy med rec is done.  Alcohol use disorder No ongoing alcohol use as patient is currently undergoing treatment at [facility] rehab and per recent PCP note, she has been there for for the past 3 months.  She is  tachycardic but no other signs of withdrawal at this time.  ?Acute left maxillary sinusitis CT showing frothy secretions in the left maxillary sinus.  Does have significant leukocytosis on labs, however, patient denies any sinus pain/pressure or purulent nasal discharge.  Depression/anxiety Pharmacy med rec pending.  DVT prophylaxis: SCDs Code Status: Full Code (discussed with the patient) Family Communication:  No family available at this time. Level of care: Telemetry bed Admission status: It is my clinical opinion that admission to INPATIENT is reasonable and necessary because of the expectation that this patient will require hospital care that crosses at least 2 midnights to treat this condition based on the medical complexity of the problems presented.  Given the aforementioned information, the predictability of an adverse outcome is felt to be significant.  Editha Ram MD [facility]  If 7PM-7AM, please contact night-coverage [email or web address]  04/08/2024, 5:02 AM

## 2024-04-08 NOTE — Plan of Care (Signed)

## 2024-04-08 NOTE — Progress Notes (Addendum)
 PROGRESS NOTE    Theresa Rios  FMW:[id number] DOB: 10-Jun-1966 DOA: 04/07/2024 PCP: Scarlett Ronal Caldron, NP   Brief Narrative:     Assessment & Plan:  Principal Problem:   CAP (community acquired pneumonia) Active Problems:   Acute hypoxemic respiratory failure (HCC)   Acute anemia   Sepsis (HCC)   AKI (acute kidney injury) (HCC)   Acute encephalopathy   Thrombocytopenia (HCC)   Sepsis secondary to possible bacterial CAP: Chest x-ray showing bilateral perihilar interstitial opacities suspicious for possible pneumonia.   SARS-CoV-2 PCR negative.  Blood cultures ordered  Continue antibiotics including ceftriaxone  and doxycycline  (avoiding macrolides due to borderline QT prolongation and EKG).   Check MRSA PCR screen, strep pneumo (negative), Legionella urinary antigens, and procalcitonin level.    Likely New onset CHF,POA:   No documented history of CHF or previous echo results in the chart.   proBNP 5136 and chest x-ray with possible pulmonary edema.   Echocardiogram ordered.   Monitor intake and output, daily weights, renal function. Fluid restriction   Acute hypoxemic respiratory failure,POA:  Likely multifactorial from pneumonia and acute CHF. Currently, stable on 2 L [place].   VBG without evidence of hypercarbia.   AKI ?Pre-renal vs cardiorenal in the setting of acute CHF.  Creatinine 2.1 (baseline 0.5-0.7).  A dose of IV Lasix  given for diuresis and repeat labs to check renal function.  Renal ultrasound ordered. F/u urine sodium and osmolality   Acute metabolic encephalopathy CT head negative for acute intracranial abnormality and no focal neurodeficit on exam.  She is on Depakote  due to known history of seizure disorder and valproic  acid level within normal range.    UDS negative.   UA with negative nitrite, small amount of leukocytes, and microscopy with >50 WBCs and few bacteria. ?UTI as patient is endorsing urinary frequency and urgency.   Continue ceftriaxone  and  urine culture ordered.   Microcytic anemia Hemoglobin 8.5 (previously 14.4 on labs 3 months ago).  Iron and TIBC low with normal ferritin suspicious for likely anemia of chronic disease.  No overt bleeding and FOBT negative.  Monitor H&H and transfuse PRBCs if hemoglobin is less than 7.   Mild thrombocytopenia No signs of bleeding, monitor platelet count.   Hyponatremia Sodium 127, previously normal 3 months ago.  A dose of IV Lasix  given for diuresis.   F/u repeat sodium level    Elevated alkaline phosphatase Transaminases and T. bili normal.  F/u GGT.   Seizure disorder Continue Depakote    Alcohol use disorder No ongoing alcohol use as patient is currently undergoing treatment at [facility] rehab and per recent PCP note, she has been there for for the past 3 months.  She is tachycardic but no other signs of withdrawal at this time. Continue with thiamine  and folate supplementation.   ?Acute left maxillary sinusitis CT showing frothy secretions in the left maxillary sinus.  Does have significant leukocytosis on labs, however, patient denies any sinus pain/pressure or purulent nasal discharge.   Major moderate Depression/anxiety Pharmacy med rec pending.  Disposition: Home   DVT prophylaxis: SCDs Start: 04/08/24 0616     Code Status: Full Code Family Communication:  None at the bedside Status is: Inpatient Remains inpatient appropriate because: CHF    Subjective:  No acute events overnight. Pending renal ultrasound and ECHO.  Examination:  General exam: Appears calm and comfortable  Respiratory system: Clear to auscultation. Respiratory effort normal. Cardiovascular system: S1 & S2 heard, RRR. No JVD, murmurs, rubs, gallops or  clicks. Trace pedal edema. Gastrointestinal system: Abdomen is nondistended, soft and nontender. No organomegaly or masses felt. Normal bowel sounds heard. Central nervous system: Alert and oriented. No focal neurological deficits. Extremities:  Symmetric 5 x 5 power. Skin: No rashes, lesions or ulcers Psychiatry: Judgement and insight appear normal. Mood & affect appropriate.      Diet Orders (From admission, onward)     Start     Ordered   04/08/24 0616  Diet Heart Room service appropriate? Yes; Fluid consistency: Thin  Diet effective now       Question Answer Comment  Room service appropriate? Yes   Fluid consistency: Thin      04/08/24 0621            Objective: Vitals:   04/08/24 0100 04/08/24 0107 04/08/24 0251 04/08/24 0736  BP: (!) 120/95  127/84 111/75  Pulse: (!) 108  (!) 115 (!) 104  Resp: (!) 25  20 20   Temp:  98.9 F (37.2 C) 98.9 F (37.2 C) 98.8 F (37.1 C)  TempSrc:  Oral Oral Oral  SpO2: 98%  94% 99%  Weight:   87.5 kg   Height:   5' 2 (1.575 m)     Intake/Output Summary (Last 24 hours) at 04/08/2024 1141 Last data filed at 04/08/2024 1131 Gross per 24 hour  Intake 1663 ml  Output 1650 ml  Net 13 ml   Filed Weights   04/07/24 1531 04/08/24 0251  Weight: 79.8 kg 87.5 kg    Scheduled Meds: Continuous Infusions:  [START ON 04/09/2024] cefTRIAXone  (ROCEPHIN )  IV     doxycycline  (VIBRAMYCIN ) IV 100 mg (04/08/24 1038)    Nutritional status     Body mass index is 35.28 kg/m.  Data Reviewed:   CBC: Recent Labs  Lab 04/07/24 1539 04/07/24 1605  WBC 20.3*  --   NEUTROABS 14.7*  --   HGB 8.5* 9.5*  HCT 25.7* 28.0*  MCV 79.6*  --   PLT 114*  --    Basic Metabolic Panel: Recent Labs  Lab 04/07/24 1539 04/07/24 1605  NA 127* 127*  K 3.6 3.5  CL 93*  --   CO2 22  --   GLUCOSE 118*  --   BUN 39*  --   CREATININE 2.12*  --   CALCIUM  8.4*  --    GFR: Estimated Creatinine Clearance: 29.7 mL/min (A) (by C-G formula based on SCr of 2.12 mg/dL (H)). Liver Function Tests: Recent Labs  Lab 04/07/24 1539  AST 16  ALT 12  ALKPHOS 193*  BILITOT 0.8  PROT 6.2*  ALBUMIN 3.1*   No results for input(s): LIPASE, AMYLASE in the last 168 hours. Recent Labs  Lab  04/07/24 1541  AMMONIA 41*   Coagulation Profile: No results for input(s): INR, PROTIME in the last 168 hours. Cardiac Enzymes: No results for input(s): CKTOTAL, CKMB, CKMBINDEX, TROPONINI in the last 168 hours. BNP (last 3 results) Recent Labs    04/07/24 1539  PROBNP 5,136.0*   HbA1C: No results for input(s): HGBA1C in the last 72 hours. CBG: No results for input(s): GLUCAP in the last 168 hours. Lipid Profile: No results for input(s): CHOL, HDL, LDLCALC, TRIG, CHOLHDL, LDLDIRECT in the last 72 hours. Thyroid  Function Tests: No results for input(s): TSH, T4TOTAL, FREET4, T3FREE, THYROIDAB in the last 72 hours. Anemia Panel: Recent Labs    04/07/24 1539 04/07/24 1810  VITAMINB12  --  464  FOLATE  --  31.9  FERRITIN  --  238  TIBC  --  227*  IRON  --  9*  RETICCTPCT 0.5  --    Sepsis Labs: No results for input(s): PROCALCITON, LATICACIDVEN in the last 168 hours.  Recent Results (from the past 240 hours)  SARS Coronavirus 2 by RT PCR (hospital order, performed in [facility] hospital lab) *cepheid single result test* Anterior Nasal Swab     Status: None   Collection Time: 04/07/24  3:49 PM   Specimen: Anterior Nasal Swab  Result Value Ref Range Status   SARS Coronavirus 2 by RT PCR NEGATIVE NEGATIVE Final    Comment: (NOTE) SARS-CoV-2 target nucleic acids are NOT DETECTED.  The SARS-CoV-2 RNA is generally detectable in upper and lower respiratory specimens during the acute phase of infection. The lowest concentration of SARS-CoV-2 viral copies this assay can detect is 250 copies / mL. A negative result does not preclude SARS-CoV-2 infection and should not be used as the sole basis for treatment or other patient management decisions.  A negative result may occur with improper specimen collection / handling, submission of specimen other than nasopharyngeal swab, presence of viral mutation(s) within the areas targeted by this  assay, and inadequate number of viral copies (<250 copies / mL). A negative result must be combined with clinical observations, patient history, and epidemiological information.  Fact Sheet for Patients:   RoadLapTop.co.za  Fact Sheet for Healthcare Providers: http://kim-miller.com/  This test is not yet approved or  cleared by the [place]  FDA and has been authorized for detection and/or diagnosis of SARS-CoV-2 by FDA under an Emergency Use Authorization (EUA).  This EUA will remain in effect (meaning this test can be used) for the duration of the COVID-19 declaration under Section 564(b)(1) of the Act, 21 U.S.C. section 360bbb-3(b)(1), unless the authorization is terminated or revoked sooner.  Performed at [facility], [address] [place], [place] [address]   Respiratory (~20 pathogens) panel by PCR     Status: None   Collection Time: 04/08/24  7:00 AM   Specimen: Nasopharyngeal Swab; Respiratory  Result Value Ref Range Status   Adenovirus NOT DETECTED NOT DETECTED Final   Coronavirus 229E NOT DETECTED NOT DETECTED Final    Comment: (NOTE) The Coronavirus on the Respiratory Panel, DOES NOT test for the novel  Coronavirus (2019 nCoV)    Coronavirus HKU1 NOT DETECTED NOT DETECTED Final   Coronavirus NL63 NOT DETECTED NOT DETECTED Final   Coronavirus OC43 NOT DETECTED NOT DETECTED Final   Metapneumovirus NOT DETECTED NOT DETECTED Final   Rhinovirus / Enterovirus NOT DETECTED NOT DETECTED Final   Influenza A NOT DETECTED NOT DETECTED Final   Influenza B NOT DETECTED NOT DETECTED Final   Parainfluenza Virus 1 NOT DETECTED NOT DETECTED Final   Parainfluenza Virus 2 NOT DETECTED NOT DETECTED Final   Parainfluenza Virus 3 NOT DETECTED NOT DETECTED Final   Parainfluenza Virus 4 NOT DETECTED NOT DETECTED Final   Respiratory Syncytial Virus NOT DETECTED NOT DETECTED Final   Bordetella pertussis NOT DETECTED NOT  DETECTED Final   Bordetella Parapertussis NOT DETECTED NOT DETECTED Final   Chlamydophila pneumoniae NOT DETECTED NOT DETECTED Final   Mycoplasma pneumoniae NOT DETECTED NOT DETECTED Final    Comment: Performed at [facility], [address], [place], [place] [address]         Radiology Studies: CT Head Wo Contrast Result Date: 04/07/2024 CLINICAL DATA:  Mental status change, unknown cause EXAM: CT HEAD WITHOUT CONTRAST TECHNIQUE: Contiguous axial images  were obtained from the base of the skull through the vertex without intravenous contrast. RADIATION DOSE REDUCTION: This exam was performed according to the departmental dose-optimization program which includes automated exposure control, adjustment of the mA and/or kV according to patient size and/or use of iterative reconstruction technique. COMPARISON:  Multiple, most recently August 30, 2023 FINDINGS: Brain: The ventricles appear age appropriate. No mass effect or midline shift. Gray-white differentiation is preserved without focal attenuation abnormality.No evidence of acute territorial infarction, extra-axial fluid collection, hemorrhage, or mass lesion. The basilar cisterns are patent without downward herniation. The cerebellar hemispheres and vermis are well formed without mass lesion or focal attenuation abnormality. Vascular: No hyperdense vessel. Skull: Normal. Negative for fracture or focal lesion. Chronically depressed fracture of the right lamina papyracea. Chronic, healed fracture deformity of the left zygomatic arch. Sinuses/Orbits: Frothy secretions in the left maxillary sinus. A few air-fluid levels present in the ethmoid sinuses. The mastoids are clear.The globes appear intact. No retrobulbar hematoma. Other: None. IMPRESSION: 1. No acute intracranial abnormality, specifically, no acute hemorrhage, territorial infarction, or intracranial mass. 2. Frothy secretions in the left maxillary sinus. Correlation for possible acute  sinusitis recommended. Electronically Signed   By: Rogelia Myers M.D.   On: 04/07/2024 16:51   DG Chest 1 View Result Date: 04/07/2024 CLINICAL DATA:  sob EXAM: CHEST  1 VIEW COMPARISON:  July 08, 2023 FINDINGS: Lower lung volumes. Elevation of the right hemidiaphragm. Interstitial opacities throughout both lungs. No lobar consolidation or pneumothorax. Blunting of both costophrenic sulci. Moderate cardiomegaly. Tortuous aorta. No acute fracture or destructive lesions. Multilevel thoracic osteophytosis. IMPRESSION: 1. Bilateral perihilar interstitial opacities, likely reflecting bronchovascular crowding due to low lung volumes. Alternatively, atypical/viral infection or interstitial edema could have this appearance, in the correct clinical context. 2. Findings suggestive of trace bilateral pleural effusions. Electronically Signed   By: Rogelia Myers M.D.   On: 04/07/2024 16:07        LOS: 0 days   Time spent= 40 mins    Deliliah Room, MD [facility]  If 7PM-7AM, please contact night-coverage  04/08/2024, 11:41 AM

## 2024-04-08 NOTE — TOC Initial Note (Signed)
 Transition of Care [facility]) - Initial/Assessment Note    Patient Details  Name: Theresa Rios MRN: [id number] Date of Birth: 07/05/1966  [facility]) CM/SW Contact:    Sudie Erminio Deems, RN Phone Number: 04/08/2024, 2:47 PM  Clinical Narrative: Patient presented for slurred speech and confusion. PTA patient states she was at [facility]. Patient states she has family support in the home and will return home once discharged. Patient does not use any DME in the home. Patient reports that she has a PCP and has transportation to appointments. Case Manager will continue to follow for transition of care needs as the patient progresses.             Expected Discharge Plan: Home/Self Care Barriers to Discharge: Continued Medical Work up   Patient Goals and CMS Choice Patient states their goals for this hospitalization and ongoing recovery are:: Patient states she will return home once stable-previously at [facility]   Choice offered to / list presented to : NA      Expected Discharge Plan and Services In-house Referral: Clinical Social Work Discharge Planning Services: CM Consult Post Acute Care Choice: NA Living arrangements for the past 2 months: Single Family Home                                      Prior Living Arrangements/Services Living arrangements for the past 2 months: Single Family Home Lives with:: Relatives Patient language and need for interpreter reviewed:: Yes Do you feel safe going back to the place where you live?: Yes      Need for Family Participation in Patient Care: No (Comment) Care giver support system in place?: No (comment)   Criminal Activity/Legal Involvement Pertinent to Current Situation/Hospitalization: No - Comment as needed  Activities of Daily Living   ADL Screening (condition at time of admission) Is the patient deaf or have difficulty hearing?: No Does the patient have difficulty seeing, even when wearing  glasses/contacts?: No Does the patient have difficulty concentrating, remembering, or making decisions?: Yes  Permission Sought/Granted Permission sought to share information with : Family Supports, Case Manager                Emotional Assessment Appearance:: Appears stated age Attitude/Demeanor/Rapport: Engaged Affect (typically observed): Appropriate Orientation: : Oriented to Self Alcohol / Substance Use: Not Applicable Psych Involvement: No (comment)  Admission diagnosis:  Acute respiratory failure with hypoxia (HCC) [J96.01] AKI (acute kidney injury) (HCC) [N17.9] Symptomatic anemia [D64.9] Acute anemia [D64.9] Patient Active Problem List   Diagnosis Date Noted   Sepsis (HCC) 04/08/2024   CAP (community acquired pneumonia) 04/08/2024   AKI (acute kidney injury) (HCC) 04/08/2024   Acute encephalopathy 04/08/2024   Thrombocytopenia (HCC) 04/08/2024   Acute anemia 04/07/2024   Hepatic encephalopathy (HCC) 01/11/2024   Low vitamin D  level 01/10/2024   Epilepsy, unspecified, intractable, without status epilepticus (HCC) 01/10/2024   Nicotine  use disorder 01/10/2024   Cannabis use disorder 01/10/2024   Increased ammonia level 01/10/2024   QT prolongation 01/09/2024   Cocaine use disorder, severe, dependence (HCC) 01/21/2023   Non compliance with medical treatment 01/21/2023   Acute hypoxemic respiratory failure (HCC) 09/27/2022   Housing instability 09/06/2022   MDD (major depressive disorder), recurrent, severe, 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 ([address]), [place] - [address] [place] ([place]) [place] [address] Phone: [phone] Fax: [phone]  [facility] - [place], [place] - [address] [place] [address] Phone: [phone] Fax: [phone]  [facility] [address] [place] [address] Phone: [phone] Fax: [phone]     Social Drivers of Health (SDOH) Social History: SDOH Screenings   Food Insecurity: Food Insecurity Present (01/08/2024)  Housing: High Risk (11/27/2022)  Transportation Needs: Unmet Transportation Needs (01/08/2024)  Utilities: At Risk (01/08/2024)  Alcohol Screen: High Risk (01/08/2024)  Depression (PHQ2-9): Low Risk  (01/15/2024)  Recent Concern: Depression (PHQ2-9) - High Risk (01/12/2024)  Tobacco Use: High Risk (04/07/2024)   SDOH Interventions:     Readmission Risk Interventions     No data to display

## 2024-04-08 NOTE — TOC Progression Note (Signed)
 Transition of Care [facility]) - Progression Note    Patient Details  Name: Theresa Rios MRN: [id number] Date of Birth: Jan 14, 1966  Transition of Care [facility]) CM/SW Contact  Isaiah Public, LCSWA Phone Number: 04/08/2024, 2:59 PM  Clinical Narrative:     CSW spoke with patient. Patient reports PTA she comes from [facility] in [place]. Patient informed CSW she plans on returning home when medically ready for dc. Patient reports she will have transportation when medically ready for dc. CSW informed patient that CSW added outpatient substance use treatment services resources to her AVS. Patient thanked CSW. All questions answered. No further questions reported at this time.  Expected Discharge Plan: Home/Self Care Barriers to Discharge: Continued Medical Work up               Expected Discharge Plan and Services In-house Referral: Clinical Social Work Discharge Planning Services: CM Consult Post Acute Care Choice: NA Living arrangements for the past 2 months: Single Family Home                                       Social Drivers of Health (SDOH) Interventions SDOH Screenings   Food Insecurity: Food Insecurity Present (04/08/2024)  Housing: Low Risk  (04/08/2024)  Transportation Needs: Unmet Transportation Needs (04/08/2024)  Utilities: At Risk (04/08/2024)  Alcohol Screen: High Risk (01/08/2024)  Depression (PHQ2-9): Low Risk  (01/15/2024)  Recent Concern: Depression (PHQ2-9) - High Risk (01/12/2024)  Tobacco Use: High Risk (04/08/2024)    Readmission Risk Interventions     No data to display

## 2024-04-08 NOTE — TOC CM/SW Note (Addendum)
 CSW attached outpatient substance use treatment services resources to patients AVS.

## 2024-04-08 NOTE — ED Notes (Signed)
 Called and informed [facility] that Pt. Is being transferred to [facility] [phone]

## 2024-04-08 NOTE — Progress Notes (Signed)
 Received patient from [facility]. Patient is alert but appears to be confused. She is oriented only to self and is unable to correctly answer question regarding the current year or her location. Patient, however, is able to follow simple commands. Slurred speech is noted. Patient reports that she came from [facility] and is currently on therapy for alcohol use disorder. Paged admitting hospitalist for patient arrival in [place].   Clydie Dillen, RN

## 2024-04-09 DIAGNOSIS — N179 Acute kidney failure, unspecified: Secondary | ICD-10-CM | POA: Diagnosis not present

## 2024-04-09 LAB — CBC WITH DIFFERENTIAL/PLATELET
Abs Immature Granulocytes: 1.4 K/uL — ABNORMAL HIGH (ref 0.00–0.07)
Band Neutrophils: 15 %
Basophils Absolute: 0 K/uL (ref 0.0–0.1)
Basophils Relative: 0 %
Eosinophils Absolute: 0.3 K/uL (ref 0.0–0.5)
Eosinophils Relative: 1 %
HCT: 25 % — ABNORMAL LOW (ref 36.0–46.0)
Hemoglobin: 8.2 g/dL — ABNORMAL LOW (ref 12.0–15.0)
Lymphocytes Relative: 5 %
Lymphs Abs: 1.4 K/uL (ref 0.7–4.0)
MCH: 26.3 pg (ref 26.0–34.0)
MCHC: 32.8 g/dL (ref 30.0–36.0)
MCV: 80.1 fL (ref 80.0–100.0)
Metamyelocytes Relative: 3 %
Monocytes Absolute: 1.4 K/uL — ABNORMAL HIGH (ref 0.1–1.0)
Monocytes Relative: 5 %
Myelocytes: 2 %
Neutro Abs: 23 K/uL — ABNORMAL HIGH (ref 1.7–7.7)
Neutrophils Relative %: 69 %
Platelets: 125 K/uL — ABNORMAL LOW (ref 150–400)
RBC: 3.12 MIL/uL — ABNORMAL LOW (ref 3.87–5.11)
RDW: 14.7 % (ref 11.5–15.5)
Smear Review: NORMAL
WBC: 27.4 K/uL — ABNORMAL HIGH (ref 4.0–10.5)
nRBC: 0 % (ref 0.0–0.2)

## 2024-04-09 LAB — BASIC METABOLIC PANEL WITH GFR
Anion gap: 11 (ref 5–15)
BUN: 48 mg/dL — ABNORMAL HIGH (ref 6–20)
CO2: 21 mmol/L — ABNORMAL LOW (ref 22–32)
Calcium: 8.2 mg/dL — ABNORMAL LOW (ref 8.9–10.3)
Chloride: 99 mmol/L (ref 98–111)
Creatinine, Ser: 2.61 mg/dL — ABNORMAL HIGH (ref 0.44–1.00)
GFR, Estimated: 21 mL/min — ABNORMAL LOW (ref >60)
Glucose, Bld: 117 mg/dL — ABNORMAL HIGH (ref 70–99)
Potassium: 3.4 mmol/L — ABNORMAL LOW (ref 3.5–5.1)
Sodium: 131 mmol/L — ABNORMAL LOW (ref 135–145)

## 2024-04-09 LAB — SODIUM, URINE, RANDOM: Sodium, Ur: <10 mmol/L

## 2024-04-09 LAB — LEGIONELLA PNEUMOPHILA SEROGP 1 UR AG: L. pneumophila Serogp 1 Ur Ag: NEGATIVE

## 2024-04-09 LAB — OSMOLALITY, URINE: Osmolality, Ur: 136 mosm/kg — ABNORMAL LOW (ref 300–900)

## 2024-04-09 LAB — MRSA NEXT GEN BY PCR, NASAL: MRSA by PCR Next Gen: NOT DETECTED

## 2024-04-09 MED ORDER — FERROUS SULFATE 325 (65 FE) MG PO TABS
325.0000 mg | ORAL_TABLET | Freq: Every day | ORAL | Status: DC
  Administered 2024-04-09 – 2024-04-14 (×6): 325 mg via ORAL
  Filled 2024-04-09 (×6): qty 1

## 2024-04-09 NOTE — Plan of Care (Signed)
   Problem: Education: Goal: Knowledge of General Education information will improve Description Including pain rating scale, medication(s)/side effects and non-pharmacologic comfort measures Outcome: Progressing

## 2024-04-09 NOTE — Plan of Care (Signed)
  Problem: Clinical Measurements: Goal: Respiratory complications will improve Outcome: Progressing Goal: Cardiovascular complication will be avoided Outcome: Progressing   Problem: Activity: Goal: Risk for activity intolerance will decrease Outcome: Progressing   Problem: Nutrition: Goal: Adequate nutrition will be maintained Outcome: Progressing   Problem: Safety: Goal: Ability to remain free from injury will improve Outcome: Progressing   Problem: Activity: Goal: Capacity to carry out activities will improve Outcome: Progressing   Problem: Cardiac: Goal: Ability to achieve and maintain adequate cardiopulmonary perfusion will improve Outcome: Progressing

## 2024-04-09 NOTE — Progress Notes (Addendum)
 PROGRESS NOTE    Theresa Rios  FMW:[id number] DOB: 1965/10/28 DOA: 04/07/2024 PCP: Scarlett Ronal Caldron, NP   Brief Narrative:     Assessment & Plan:  Principal Problem:   CAP (community acquired pneumonia) Active Problems:   Acute hypoxemic respiratory failure (HCC)   Acute anemia   Sepsis (HCC)   AKI (acute kidney injury) (HCC)   Acute encephalopathy   Thrombocytopenia (HCC)   Sepsis secondary to possible bacterial CAP/UTI: Chest x-ray showing bilateral perihilar interstitial opacities suspicious for possible pneumonia.   SARS-CoV-2 PCR negative.  Blood cultures ordered  Continue antibiotics including ceftriaxone  and doxycycline  (avoiding macrolides due to borderline QT prolongation and EKG).   Check MRSA PCR screen, strep pneumo (negative), Legionella urinary antigens, and procalcitonin level.  Urine culture grew 50000/ml colonies of E.coli   Likely New onset acute diastolic CHF,POA:   No documented history of CHF or previous echo results in the chart.   proBNP 5136 and chest x-ray with possible pulmonary edema.   Echocardiogram showed normal EF and no significant valvular abnormalities.   Monitor intake and output, daily weights, renal function. Fluid restriction of 1.5 L per day   Acute hypoxemic respiratory failure,POA:  Likely multifactorial from pneumonia and acute CHF. Currently, stable on 2 L [place].   VBG without evidence of hypercarbia.   AKI ?Pre-renal vs cardiorenal in the setting of acute CHF.  Creatinine 2.6 today (baseline 0.5-0.7).  A dose of IV Lasix  given on 7/31. Renal ultrasound is unremarkable. F/u urine sodium and osmolality F/u renal panel in am   Acute metabolic encephalopathy: Likely in the setting of UTI CT head negative for acute intracranial abnormality and no focal neurodeficit on exam.  She is on Depakote  due to known history of seizure disorder and valproic  acid level within normal range.    UDS negative.   UA with negative nitrite, small  amount of leukocytes, and microscopy with >50 WBCs and few bacteria. Continue ceftriaxone    Microcytic anemia/iron deficiency anemia: Hemoglobin 8.5 (previously 14.4 on labs 3 months ago). No overt bleeding and FOBT negative.  Monitor H&H and transfuse PRBCs if hemoglobin is less than 7. Started on ferrous sulfate supplementation.   Mild thrombocytopenia No signs of bleeding, monitor platelet count.   Hyponatremia Sodium 127, previously normal 3 months ago.  A dose of IV Lasix  given for diuresis.   F/u repeat sodium level    Elevated alkaline phosphatase Transaminases and T. bili normal.  F/u GGT.   Seizure disorder Continue Depakote    Alcohol use disorder No ongoing alcohol use as patient is currently undergoing treatment at [facility] rehab and per recent PCP note, she has been there for for the past 3 months.  She is tachycardic but no other signs of withdrawal at this time. Continue with thiamine  and folate supplementation.   ?Acute left maxillary sinusitis CT showing frothy secretions in the left maxillary sinus.  Does have significant leukocytosis on labs, however, patient denies any sinus pain/pressure or purulent nasal discharge.   Major moderate Depression/anxiety Pharmacy med rec pending.  Disposition: Home   DVT prophylaxis: SCDs Start: 04/08/24 0616     Code Status: Full Code Family Communication:  None at the bedside Status is: Inpatient Remains inpatient appropriate because: CHF    Subjective:  No acute events overnight. Appears drowsy this morning but arousable. Denies chest pain or shortness of breath.  Examination:  General exam: Appears drowsy, opens eyes to commands Respiratory system: Clear to auscultation. Respiratory effort normal. Cardiovascular system:  S1 & S2 heard, RRR. No JVD, murmurs, rubs, gallops or clicks. Trace pedal edema. Gastrointestinal system: Abdomen is nondistended, soft and nontender. No organomegaly or masses felt. Normal bowel  sounds heard. Central nervous system: Alert and oriented. No focal neurological deficits. Extremities: Symmetric 5 x 5 power. Skin: No rashes, lesions or ulcers       Diet Orders (From admission, onward)     Start     Ordered   04/08/24 0616  Diet Heart Room service appropriate? Yes; Fluid consistency: Thin  Diet effective now       Question Answer Comment  Room service appropriate? Yes   Fluid consistency: Thin      04/08/24 0621            Objective: Vitals:   04/08/24 2333 04/09/24 0327 04/09/24 0757 04/09/24 1209  BP: 105/61 130/75 90/61 110/68  Pulse: (!) 103 (!) 104 96 96  Resp: 18 20 15 12   Temp: 98.9 F (37.2 C) 98.7 F (37.1 C) 98.7 F (37.1 C) 98.8 F (37.1 C)  TempSrc: Oral Oral Oral Oral  SpO2: 96% 95% 95% 95%  Weight:  85.8 kg    Height:        Intake/Output Summary (Last 24 hours) at 04/09/2024 1422 Last data filed at 04/09/2024 1012 Gross per 24 hour  Intake 830 ml  Output 1750 ml  Net -920 ml   Filed Weights   04/07/24 1531 04/08/24 0251 04/09/24 0327  Weight: 79.8 kg 87.5 kg 85.8 kg    Scheduled Meds:  cholecalciferol   400 Units Oral Daily   divalproex   500 mg Oral BID   doxycycline   100 mg Oral Q12H   folic acid   1 mg Oral Daily   thiamine   100 mg Oral Daily   Continuous Infusions:  cefTRIAXone  (ROCEPHIN )  IV 1 g (04/09/24 0513)    Nutritional status     Body mass index is 34.59 kg/m.  Data Reviewed:   CBC: Recent Labs  Lab 04/07/24 1539 04/07/24 1605 04/08/24 1150 04/09/24 0452  WBC 20.3*  --  25.5* 27.4*  NEUTROABS 14.7*  --   --  23.0*  HGB 8.5* 9.5* 8.4* 8.2*  HCT 25.7* 28.0* 26.4* 25.0*  MCV 79.6*  --  81.5 80.1  PLT 114*  --  116* 125*   Basic Metabolic Panel: Recent Labs  Lab 04/07/24 1539 04/07/24 1605 04/08/24 1150 04/09/24 0452  NA 127* 127* 127* 131*  K 3.6 3.5 3.7 3.4*  CL 93*  --  95* 99  CO2 22  --  21* 21*  GLUCOSE 118*  --  138* 117*  BUN 39*  --  38* 48*  CREATININE 2.12*  --  2.30*  2.61*  CALCIUM  8.4*  --  8.1* 8.2*   GFR: Estimated Creatinine Clearance: 23.9 mL/min (A) (by C-G formula based on SCr of 2.61 mg/dL (H)). Liver Function Tests: Recent Labs  Lab 04/07/24 1539 04/08/24 1150  AST 16 14*  ALT 12 12  ALKPHOS 193* 165*  BILITOT 0.8 1.0  PROT 6.2* 5.7*  ALBUMIN 3.1* 2.0*   No results for input(s): LIPASE, AMYLASE in the last 168 hours. Recent Labs  Lab 04/07/24 1541 04/08/24 1150  AMMONIA 41* 45*   Coagulation Profile: No results for input(s): INR, PROTIME in the last 168 hours. Cardiac Enzymes: No results for input(s): CKTOTAL, CKMB, CKMBINDEX, TROPONINI in the last 168 hours. BNP (last 3 results) Recent Labs    04/07/24 1539  PROBNP 5,136.0*   HbA1C:  No results for input(s): HGBA1C in the last 72 hours. CBG: No results for input(s): GLUCAP in the last 168 hours. Lipid Profile: No results for input(s): CHOL, HDL, LDLCALC, TRIG, CHOLHDL, LDLDIRECT in the last 72 hours. Thyroid  Function Tests: No results for input(s): TSH, T4TOTAL, FREET4, T3FREE, THYROIDAB in the last 72 hours. Anemia Panel: Recent Labs    04/07/24 1539 04/07/24 1810  VITAMINB12  --  464  FOLATE  --  31.9  FERRITIN  --  238  TIBC  --  227*  IRON  --  9*  RETICCTPCT 0.5  --    Sepsis Labs: Recent Labs  Lab 04/08/24 1150  PROCALCITON 5.84  LATICACIDVEN 1.1    Recent Results (from the past 240 hours)  SARS Coronavirus 2 by RT PCR (hospital order, performed in [facility] hospital lab) *cepheid single result test* Anterior Nasal Swab     Status: None   Collection Time: 04/07/24  3:49 PM   Specimen: Anterior Nasal Swab  Result Value Ref Range Status   SARS Coronavirus 2 by RT PCR NEGATIVE NEGATIVE Final    Comment: (NOTE) SARS-CoV-2 target nucleic acids are NOT DETECTED.  The SARS-CoV-2 RNA is generally detectable in upper and lower respiratory specimens during the acute phase of infection. The  lowest concentration of SARS-CoV-2 viral copies this assay can detect is 250 copies / mL. A negative result does not preclude SARS-CoV-2 infection and should not be used as the sole basis for treatment or other patient management decisions.  A negative result may occur with improper specimen collection / handling, submission of specimen other than nasopharyngeal swab, presence of viral mutation(s) within the areas targeted by this assay, and inadequate number of viral copies (<250 copies / mL). A negative result must be combined with clinical observations, patient history, and epidemiological information.  Fact Sheet for Patients:   RoadLapTop.co.za  Fact Sheet for Healthcare Providers: http://kim-miller.com/  This test is not yet approved or  cleared by the [place]  FDA and has been authorized for detection and/or diagnosis of SARS-CoV-2 by FDA under an Emergency Use Authorization (EUA).  This EUA will remain in effect (meaning this test can be used) for the duration of the COVID-19 declaration under Section 564(b)(1) of the Act, 21 U.S.C. section 360bbb-3(b)(1), unless the authorization is terminated or revoked sooner.  Performed at [facility], [address] [place], [place] [address]   Urine Culture (for pregnant, neutropenic or urologic patients or patients with an indwelling urinary catheter)     Status: Abnormal (Preliminary result)   Collection Time: 04/08/24  6:22 AM   Specimen: Urine, Clean Catch  Result Value Ref Range Status   Specimen Description URINE, CLEAN CATCH  Final   Special Requests NONE  Final   Culture (A)  Final    50,000 COLONIES/mL ESCHERICHIA COLI SUSCEPTIBILITIES TO FOLLOW Performed at [facility], [address] [place], [place] [address]    Report Status PENDING  Incomplete  Respiratory (~20 pathogens) panel by PCR     Status: None   Collection Time: 04/08/24  7:00 AM    Specimen: Nasopharyngeal Swab; Respiratory  Result Value Ref Range Status   Adenovirus NOT DETECTED NOT DETECTED Final   Coronavirus 229E NOT DETECTED NOT DETECTED Final    Comment: (NOTE) The Coronavirus on the Respiratory Panel, DOES NOT test for the novel  Coronavirus (2019 nCoV)    Coronavirus HKU1 NOT DETECTED NOT DETECTED Final   Coronavirus NL63 NOT DETECTED  NOT DETECTED Final   Coronavirus OC43 NOT DETECTED NOT DETECTED Final   Metapneumovirus NOT DETECTED NOT DETECTED Final   Rhinovirus / Enterovirus NOT DETECTED NOT DETECTED Final   Influenza A NOT DETECTED NOT DETECTED Final   Influenza B NOT DETECTED NOT DETECTED Final   Parainfluenza Virus 1 NOT DETECTED NOT DETECTED Final   Parainfluenza Virus 2 NOT DETECTED NOT DETECTED Final   Parainfluenza Virus 3 NOT DETECTED NOT DETECTED Final   Parainfluenza Virus 4 NOT DETECTED NOT DETECTED Final   Respiratory Syncytial Virus NOT DETECTED NOT DETECTED Final   Bordetella pertussis NOT DETECTED NOT DETECTED Final   Bordetella Parapertussis NOT DETECTED NOT DETECTED Final   Chlamydophila pneumoniae NOT DETECTED NOT DETECTED Final   Mycoplasma pneumoniae NOT DETECTED NOT DETECTED Final    Comment: Performed at [facility], [address], [place], [place] [address]  Culture, blood (routine x 2) Call MD if unable to obtain prior to antibiotics being given     Status: None (Preliminary result)   Collection Time: 04/08/24 11:31 AM   Specimen: BLOOD  Result Value Ref Range Status   Specimen Description BLOOD SITE NOT SPECIFIED  Final   Special Requests   Final    BOTTLES DRAWN AEROBIC AND ANAEROBIC Blood Culture adequate volume   Culture   Final    NO GROWTH < 24 HOURS Performed at [facility], [address] [place], [place] [address]    Report Status PENDING  Incomplete  Culture, blood (routine x 2) Call MD if unable to obtain prior to antibiotics being given     Status: None (Preliminary result)   Collection Time:  04/08/24 11:53 AM   Specimen: BLOOD  Result Value Ref Range Status   Specimen Description BLOOD SITE NOT SPECIFIED  Final   Special Requests   Final    BOTTLES DRAWN AEROBIC AND ANAEROBIC Blood Culture adequate volume   Culture   Final    NO GROWTH < 24 HOURS Performed at [facility], [address] [place], [place] [address]    Report Status PENDING  Incomplete  MRSA Next Gen by PCR, Nasal     Status: None   Collection Time: 04/08/24 11:45 PM   Specimen: Nasal Mucosa; Nasal Swab  Result Value Ref Range Status   MRSA by PCR Next Gen NOT DETECTED NOT DETECTED Final    Comment: (NOTE) The GeneXpert MRSA Assay (FDA approved for NASAL specimens only), is one component of a comprehensive MRSA colonization surveillance program. It is not intended to diagnose MRSA infection nor to guide or monitor treatment for MRSA infections. Test performance is not FDA approved in patients less than 24 years old. Performed at [facility], [address] [place], [place] [address]          Radiology Studies: ECHOCARDIOGRAM COMPLETE Result Date: 04/08/2024    ECHOCARDIOGRAM REPORT   Patient Name:   TOINETTE LACKIE Date of Exam: 04/08/2024 Medical Rec #:  [id number]      Height:       62.0 in Accession #:    [id number]     Weight:       192.9 lb Date of Birth:  1966/07/24     BSA:          1.883 m Patient Age:    57 years       BP:           111/75 mmHg Patient Gender: F  HR:           100 bpm. Exam Location:  Inpatient Procedure: 2D Echo, Cardiac Doppler and Color Doppler (Both Spectral and Color            Flow Doppler were utilized during procedure). Indications:    CHF  History:        Patient has no prior history of Echocardiogram examinations.  Sonographer:    Carmelita Hartshorn RDCS, FE, PE Referring Phys: [id number] VASUNDHRA RATHORE IMPRESSIONS  1. Left ventricular ejection fraction, by estimation, is 60 to 65%. The left ventricle has normal function. The left ventricle has no  regional wall motion abnormalities. Left ventricular diastolic parameters were normal.  2. Right ventricular systolic function is normal. The right ventricular size is normal.  3. Left atrial size was mildly dilated.  4. The mitral valve is normal in structure. Mild mitral valve regurgitation. No evidence of mitral stenosis.  5. The aortic valve is normal in structure. Aortic valve regurgitation is not visualized. No aortic stenosis is present.  6. The inferior vena cava is normal in size with greater than 50% respiratory variability, suggesting right atrial pressure of 3 mmHg. Conclusion(s)/Recommendation(s): Technically limited study due to poor sound wave transmission. FINDINGS  Left Ventricle: Left ventricular ejection fraction, by estimation, is 60 to 65%. The left ventricle has normal function. The left ventricle has no regional wall motion abnormalities. The left ventricular internal cavity size was normal in size. There is  no left ventricular hypertrophy. Left ventricular diastolic parameters were normal. Right Ventricle: The right ventricular size is normal. No increase in right ventricular wall thickness. Right ventricular systolic function is normal. Left Atrium: Left atrial size was mildly dilated. Right Atrium: Right atrial size was normal in size. Pericardium: There is no evidence of pericardial effusion. Mitral Valve: The mitral valve is normal in structure. Mild mitral valve regurgitation. No evidence of mitral valve stenosis. Tricuspid Valve: The tricuspid valve is normal in structure. Tricuspid valve regurgitation is mild . No evidence of tricuspid stenosis. Aortic Valve: The aortic valve is normal in structure. Aortic valve regurgitation is not visualized. No aortic stenosis is present. Aortic valve mean gradient measures 7.0 mmHg. Aortic valve peak gradient measures 12.4 mmHg. Aortic valve area, by VTI measures 2.54 cm. Pulmonic Valve: The pulmonic valve was normal in structure. Pulmonic valve  regurgitation is trivial. No evidence of pulmonic stenosis. Aorta: The aortic root is normal in size and structure. Venous: The inferior vena cava is normal in size with greater than 50% respiratory variability, suggesting right atrial pressure of 3 mmHg. IAS/Shunts: No atrial level shunt detected by color flow Doppler.  LEFT VENTRICLE PLAX 2D LVIDd:         5.00 cm   Diastology LVIDs:         3.50 cm   LV e' medial:    9.36 cm/s LV PW:         1.00 cm   LV E/e' medial:  13.7 LV IVS:        0.90 cm   LV e' lateral:   11.40 cm/s LVOT diam:     2.00 cm   LV E/e' lateral: 11.2 LV SV:         68 LV SV Index:   36 LVOT Area:     3.14 cm  RIGHT VENTRICLE RV S prime:     10.00 cm/s TAPSE (M-mode): 1.8 cm LEFT ATRIUM  Index        RIGHT ATRIUM           Index LA diam:        4.40 cm 2.34 cm/m   RA Area:     19.10 cm LA Vol (A2C):   58.4 ml 31.02 ml/m  RA Volume:   47.70 ml  25.34 ml/m LA Vol (A4C):   73.1 ml 38.83 ml/m LA Biplane Vol: 66.6 ml 35.38 ml/m  AORTIC VALVE AV Area (Vmax):    2.37 cm AV Area (Vmean):   2.40 cm AV Area (VTI):     2.54 cm AV Vmax:           176.00 cm/s AV Vmean:          118.000 cm/s AV VTI:            0.270 m AV Peak Grad:      12.4 mmHg AV Mean Grad:      7.0 mmHg LVOT Vmax:         133.00 cm/s LVOT Vmean:        90.100 cm/s LVOT VTI:          0.218 m LVOT/AV VTI ratio: 0.81  AORTA Ao Root diam: 2.50 cm MITRAL VALVE MV Area (PHT): 5.02 cm     SHUNTS MV Decel Time: 151 msec     Systemic VTI:  0.22 m MV E velocity: 128.00 cm/s  Systemic Diam: 2.00 cm MV A velocity: 92.20 cm/s MV E/A ratio:  1.39 Toribio Fuel MD Electronically signed by Toribio Fuel MD Signature Date/Time: 04/08/2024/6:27:43 PM    Final    US  RENAL Result Date: 04/08/2024 CLINICAL DATA:  Acute renal insufficiency. EXAM: RENAL / URINARY TRACT ULTRASOUND COMPLETE COMPARISON:  CT abdomen pelvis dated 05/06/2022. FINDINGS: Right Kidney: Renal measurements: 10.6 x 5.0 x 5.8 cm = volume: 163 mL. Echogenicity  within normal limits. No mass or hydronephrosis visualized. Left Kidney: Renal measurements: 11.1 x 6.6 x 7.9 cm = volume: 305 mL. Echogenicity within normal limits. No mass or hydronephrosis visualized. Bladder: Appears normal for degree of bladder distention. Other: None. IMPRESSION: Unremarkable renal ultrasound. Electronically Signed   By: Vanetta Chou M.D.   On: 04/08/2024 11:48   CT Head Wo Contrast Result Date: 04/07/2024 CLINICAL DATA:  Mental status change, unknown cause EXAM: CT HEAD WITHOUT CONTRAST TECHNIQUE: Contiguous axial images were obtained from the base of the skull through the vertex without intravenous contrast. RADIATION DOSE REDUCTION: This exam was performed according to the departmental dose-optimization program which includes automated exposure control, adjustment of the mA and/or kV according to patient size and/or use of iterative reconstruction technique. COMPARISON:  Multiple, most recently August 30, 2023 FINDINGS: Brain: The ventricles appear age appropriate. No mass effect or midline shift. Gray-white differentiation is preserved without focal attenuation abnormality.No evidence of acute territorial infarction, extra-axial fluid collection, hemorrhage, or mass lesion. The basilar cisterns are patent without downward herniation. The cerebellar hemispheres and vermis are well formed without mass lesion or focal attenuation abnormality. Vascular: No hyperdense vessel. Skull: Normal. Negative for fracture or focal lesion. Chronically depressed fracture of the right lamina papyracea. Chronic, healed fracture deformity of the left zygomatic arch. Sinuses/Orbits: Frothy secretions in the left maxillary sinus. A few air-fluid levels present in the ethmoid sinuses. The mastoids are clear.The globes appear intact. No retrobulbar hematoma. Other: None. IMPRESSION: 1. No acute intracranial abnormality, specifically, no acute hemorrhage, territorial infarction, or intracranial mass. 2.  Frothy secretions in the left  maxillary sinus. Correlation for possible acute sinusitis recommended. Electronically Signed   By: Rogelia Myers M.D.   On: 04/07/2024 16:51   DG Chest 1 View Result Date: 04/07/2024 CLINICAL DATA:  sob EXAM: CHEST  1 VIEW COMPARISON:  July 08, 2023 FINDINGS: Lower lung volumes. Elevation of the right hemidiaphragm. Interstitial opacities throughout both lungs. No lobar consolidation or pneumothorax. Blunting of both costophrenic sulci. Moderate cardiomegaly. Tortuous aorta. No acute fracture or destructive lesions. Multilevel thoracic osteophytosis. IMPRESSION: 1. Bilateral perihilar interstitial opacities, likely reflecting bronchovascular crowding due to low lung volumes. Alternatively, atypical/viral infection or interstitial edema could have this appearance, in the correct clinical context. 2. Findings suggestive of trace bilateral pleural effusions. Electronically Signed   By: Rogelia Myers M.D.   On: 04/07/2024 16:07        LOS: 1 day   Time spent= 40 mins    Deliliah Room, MD [facility]  If 7PM-7AM, please contact night-coverage  04/09/2024, 2:22 PM

## 2024-04-10 DIAGNOSIS — J189 Pneumonia, unspecified organism: Secondary | ICD-10-CM | POA: Diagnosis not present

## 2024-04-10 LAB — COMPREHENSIVE METABOLIC PANEL WITH GFR
ALT: 11 U/L (ref 0–44)
AST: 14 U/L — ABNORMAL LOW (ref 15–41)
Albumin: 1.9 g/dL — ABNORMAL LOW (ref 3.5–5.0)
Alkaline Phosphatase: 157 U/L — ABNORMAL HIGH (ref 38–126)
Anion gap: 13 (ref 5–15)
BUN: 51 mg/dL — ABNORMAL HIGH (ref 6–20)
CO2: 19 mmol/L — ABNORMAL LOW (ref 22–32)
Calcium: 8.3 mg/dL — ABNORMAL LOW (ref 8.9–10.3)
Chloride: 103 mmol/L (ref 98–111)
Creatinine, Ser: 2.57 mg/dL — ABNORMAL HIGH (ref 0.44–1.00)
GFR, Estimated: 21 mL/min — ABNORMAL LOW (ref >60)
Glucose, Bld: 122 mg/dL — ABNORMAL HIGH (ref 70–99)
Potassium: 3.7 mmol/L (ref 3.5–5.1)
Sodium: 135 mmol/L (ref 135–145)
Total Bilirubin: 0.7 mg/dL (ref 0.0–1.2)
Total Protein: 5.8 g/dL — ABNORMAL LOW (ref 6.5–8.1)

## 2024-04-10 LAB — CBC WITH DIFFERENTIAL/PLATELET
Abs Immature Granulocytes: 4.2 K/uL — ABNORMAL HIGH (ref 0.00–0.07)
Basophils Absolute: 0 K/uL (ref 0.0–0.1)
Basophils Relative: 0 %
Eosinophils Absolute: 0 K/uL (ref 0.0–0.5)
Eosinophils Relative: 0 %
HCT: 24.3 % — ABNORMAL LOW (ref 36.0–46.0)
Hemoglobin: 7.9 g/dL — ABNORMAL LOW (ref 12.0–15.0)
Lymphocytes Relative: 5 %
Lymphs Abs: 1.5 K/uL (ref 0.7–4.0)
MCH: 26.2 pg (ref 26.0–34.0)
MCHC: 32.5 g/dL (ref 30.0–36.0)
MCV: 80.5 fL (ref 80.0–100.0)
Metamyelocytes Relative: 3 %
Monocytes Absolute: 1.2 K/uL — ABNORMAL HIGH (ref 0.1–1.0)
Monocytes Relative: 4 %
Myelocytes: 10 %
Neutro Abs: 22.9 K/uL — ABNORMAL HIGH (ref 1.7–7.7)
Neutrophils Relative %: 77 %
Platelets: 169 K/uL (ref 150–400)
Promyelocytes Relative: 1 %
RBC: 3.02 MIL/uL — ABNORMAL LOW (ref 3.87–5.11)
RDW: 14.8 % (ref 11.5–15.5)
WBC: 29.8 K/uL — ABNORMAL HIGH (ref 4.0–10.5)
nRBC: 0 % (ref 0.0–0.2)

## 2024-04-10 LAB — URINE CULTURE: Culture: 50000 — AB

## 2024-04-10 MED ORDER — POTASSIUM CHLORIDE CRYS ER 20 MEQ PO TBCR
40.0000 meq | EXTENDED_RELEASE_TABLET | Freq: Once | ORAL | Status: AC
  Administered 2024-04-10: 40 meq via ORAL
  Filled 2024-04-10: qty 2

## 2024-04-10 MED ORDER — LEVOFLOXACIN 750 MG PO TABS
750.0000 mg | ORAL_TABLET | ORAL | Status: DC
  Administered 2024-04-10 – 2024-04-12 (×2): 750 mg via ORAL
  Filled 2024-04-10 (×2): qty 1

## 2024-04-10 NOTE — Evaluation (Signed)
 Occupational Therapy Evaluation Patient Details Name: Theresa Rios MRN: [id number] DOB: 1966-07-06 Today's Date: 04/10/2024   History of Present Illness   Theresa Rios is a 58 y.o. female admitted 04/07/24 for sepsis secondary to possible bacterial CAP. Pt presented with slurred speech and confusion. Found to have new onset CHF, acute hypoxemic respiratory failure, AKI, acute metabolic encephalopathy, and  E.coli UTI. PMHx: depression, anxiety, cocaine abuse, prior suicide attempt, epilepsy, expressive aphasia, hepatic encephalopathy, tobacco abuse, alcohol use disorder currently undergoing treatment at [facility].     Clinical Impressions PTA, pt recently at rehab for sobriety and reports being able to perform her ADL/IADL. Upon eval, pt with mild decr balance, activity tolerance, problem solving. Pt needing up to CGA for BADL. Pt needing intermittent cues for problem solving in light of use of items in room not being like those she has at home (the phone/call bell). Pt is eager to finish her stay at rehab and is excited to graduate the program soon. Recommend dc back to sobriety program and initiate HHOT for initial home safety eval afterward. Will continue to follow.      If plan is discharge home, recommend the following:   A little help with walking and/or transfers;A little help with bathing/dressing/bathroom;Assistance with cooking/housework;Assist for transportation;Help with stairs or ramp for entrance;Direct supervision/assist for financial management;Direct supervision/assist for medications management     Functional Status Assessment   Patient has had a recent decline in their functional status and demonstrates the ability to make significant improvements in function in a reasonable and predictable amount of time.     Equipment Recommendations   None recommended by OT     Recommendations for Other Services         Precautions/Restrictions    Precautions Precautions: Fall Recall of Precautions/Restrictions: Impaired Restrictions Weight Bearing Restrictions Per Provider Order: No     Mobility Bed Mobility Overal bed mobility: Modified Independent             General bed mobility comments: sit to supine    Transfers Overall transfer level: Needs assistance Equipment used: None Transfers: Sit to/from Stand, Bed to chair/wheelchair/BSC Sit to Stand: Contact guard assist     Step pivot transfers: Contact guard assist     General transfer comment: Pt stood from toilet, BSC, and recliner chair by pushing up with  BUE support. No physical assist to stand. CGA for safety/stability. Good eccentric control.      Balance Overall balance assessment: Needs assistance Sitting-balance support: No upper extremity supported, Feet supported Sitting balance-Leahy Scale: Fair Sitting balance - Comments: Pt able to reach outside her BOS and complete pericare in bathroom with close supervision for safety.   Standing balance support: No upper extremity supported, During functional activity Standing balance-Leahy Scale: Fair                             ADL either performed or assessed with clinical judgement   ADL Overall ADL's : Needs assistance/impaired Eating/Feeding: Independent;Sitting   Grooming: Contact guard assist;Standing   Upper Body Bathing: Set up;Sitting   Lower Body Bathing: Contact guard assist;Sit to/from stand   Upper Body Dressing : Set up;Sitting   Lower Body Dressing: Contact guard assist;Sit to/from stand   Toilet Transfer: Contact guard assist;Ambulation           Functional mobility during ADLs: Contact guard assist       Vision Patient Visual Report: No  change from baseline Vision Assessment?: No apparent visual deficits     Perception         Praxis         Pertinent Vitals/Pain Pain Assessment Pain Assessment: No/denies pain     Extremity/Trunk Assessment  Upper Extremity Assessment Upper Extremity Assessment: Overall WFL for tasks assessed;RUE deficits/detail RUE Deficits / Details: wrist cock up splint on RUE; pt reports carpal tunnel and does exhibit minimcally decr grip strength   Lower Extremity Assessment Lower Extremity Assessment: Defer to PT evaluation   Cervical / Trunk Assessment Cervical / Trunk Assessment: Normal   Communication Communication Communication: No apparent difficulties   Cognition Arousal: Alert Behavior During Therapy: WFL for tasks assessed/performed Cognition: Cognition impaired       Memory impairment (select all impairments): Short-term memory   Executive functioning impairment (select all impairments): Reasoning, Problem solving, Organization                   Following commands: Impaired Following commands impaired: Only follows one step commands consistently, Follows multi-step commands with increased time, Follows multi-step commands inconsistently     Cueing  General Comments   Cueing Techniques: Verbal cues;Gestural cues;Visual cues  vss   Exercises     Shoulder Instructions      Home Living Family/patient expects to be discharged to:: Other (Comment) [facility]) Living Arrangements: Non-relatives/Friends (Friend Willard) and his mother) Available Help at Discharge: Friend(s);Available PRN/intermittently Type of Home: House       Home Layout: One level     Bathroom Shower/Tub: Chief Strategy Officer: Standard     Home Equipment: None   Additional Comments: Pt plans to go back to [facility] to recieve her 90 day chip and belonings then stay with at her friend's place. She is unsure if he has any steps to enter his home.      Prior Functioning/Environment Prior Level of Function : Patient poor historian/Family not available;Independent/Modified Independent;History of Falls (last six months)             Mobility Comments: Ambulates without AD. Reports  falls in the past 19mo, but can't recall the exact number. ADLs Comments: Indep with ADLs/IADLs.    OT Problem List: Decreased strength;Decreased activity tolerance;Impaired balance (sitting and/or standing);Decreased cognition   OT Treatment/Interventions: Self-care/ADL training;Therapeutic exercise;DME and/or AE instruction;Therapeutic activities;Cognitive remediation/compensation;Patient/family education      OT Goals(Current goals can be found in the care plan section)   Acute Rehab OT Goals Patient Stated Goal: graduate from [facility] OT Goal Formulation: With patient Time For Goal Achievement: 04/24/24 Potential to Achieve Goals: Good   OT Frequency:  Min 2X/week    Co-evaluation              AM-PAC OT 6 Clicks Daily Activity     Outcome Measure Help from another person eating meals?: None Help from another person taking care of personal grooming?: A Little Help from another person toileting, which includes using toliet, bedpan, or urinal?: A Little Help from another person bathing (including washing, rinsing, drying)?: A Little Help from another person to put on and taking off regular upper body clothing?: A Little Help from another person to put on and taking off regular lower body clothing?: A Little 6 Click Score: 19   End of Session Nurse Communication: Mobility status  Activity Tolerance: Patient tolerated treatment well Patient left: in bed;with call bell/phone within reach;with bed alarm set;with nursing/sitter in room  OT Visit Diagnosis: Unsteadiness on  feet (R26.81);Muscle weakness (generalized) (M62.81)                Time: 8750-8694 OT Time Calculation (min): 16 min Charges:  OT General Charges $OT Visit: 1 Visit OT Evaluation $OT Eval Low Complexity: 1 Low  Elma JONETTA Lebron FREDERICK, OTR/L [facility] Acute Rehabilitation Office: [phone]   Elma JONETTA Lebron 04/10/2024, 2:58 PM

## 2024-04-10 NOTE — Plan of Care (Signed)
   Problem: Education: Goal: Knowledge of General Education information will improve Description Including pain rating scale, medication(s)/side effects and non-pharmacologic comfort measures Outcome: Progressing

## 2024-04-10 NOTE — Plan of Care (Signed)
  Problem: Clinical Measurements: Goal: Respiratory complications will improve Outcome: Progressing   Problem: Nutrition: Goal: Adequate nutrition will be maintained Outcome: Progressing   Problem: Elimination: Goal: Will not experience complications related to urinary retention Outcome: Progressing   Problem: Safety: Goal: Ability to remain free from injury will improve Outcome: Progressing   Problem: Activity: Goal: Capacity to carry out activities will improve Outcome: Progressing   Problem: Respiratory: Goal: Ability to maintain adequate ventilation will improve Outcome: Progressing Goal: Ability to maintain a clear airway will improve Outcome: Progressing

## 2024-04-10 NOTE — Progress Notes (Addendum)
 PROGRESS NOTE    Theresa Rios  FMW:[id number] DOB: 1965/10/24 DOA: 04/07/2024 PCP: Scarlett Ronal Caldron, NP   Brief Narrative:   58 y.o. female with medical history significant of depression, anxiety, cocaine abuse, prior suicide attempt, epilepsy, expressive aphasia, hepatic encephalopathy, tobacco abuse, alcohol use disorder currently undergoing treatment at [facility] rehab.  She was sent to the ED yesterday from rehab for evaluation of slurred speech and confusion. Being treate for possible pneumonia and ESBL. E.coli UTI.  Assessment & Plan:  Principal Problem:   CAP (community acquired pneumonia) Active Problems:   Acute hypoxemic respiratory failure (HCC)   Acute anemia   Sepsis (HCC)   AKI (acute kidney injury) (HCC)   Acute encephalopathy   Thrombocytopenia (HCC)   Sepsis secondary to possible bacterial CAP/UTI: Chest x-ray showing bilateral perihilar interstitial opacities suspicious for possible pneumonia.   SARS-CoV-2 PCR negative.  Blood cultures ordered-NGTD Continue antibiotics including levofloxacin  and doxycycline  (avoiding macrolides due to borderline QT prolongation and EKG).  Dced Rocephin  on 8/2. Check MRSA PCR screen, strep pneumo (negative), Legionella urinary antigens, and procalcitonin level.  Urine culture grew 50000/ml colonies of ESBL. E.coli  ESBL. E.coli: Started on oral levofloxacin  750 mg Q48 hrs on 8/2   Likely New onset acute diastolic CHF,POA:   No documented history of CHF or previous echo results in the chart.   proBNP 5136 and chest x-ray with possible pulmonary edema.   Echocardiogram showed normal EF and no significant valvular abnormalities.   Monitor intake and output, daily weights, renal function. Fluid restriction of 1.5 L per day   Acute hypoxemic respiratory failure,POA:  Likely multifactorial from pneumonia and acute CHF. Currently, stable on 2 L [place].   VBG without evidence of hypercarbia.   AKI ?Pre-renal vs cardiorenal in the  setting of acute CHF.  Creatinine 2.6 today (baseline 0.5-0.7).  A dose of IV Lasix  given on 7/31. Renal ultrasound is unremarkable. F/u urine sodium and osmolality F/u renal panel in am   Acute metabolic encephalopathy: Likely in the setting of UTI CT head negative for acute intracranial abnormality and no focal neurodeficit on exam.  She is on Depakote  due to known history of seizure disorder and valproic  acid level within normal range.    UDS negative.   UA with negative nitrite, small amount of leukocytes, and microscopy with >50 WBCs and few bacteria. Continue ceftriaxone    Microcytic anemia/iron deficiency anemia: Hemoglobin 8.5 (previously 14.4 on labs 3 months ago). No overt bleeding and FOBT negative.  Monitor H&H and transfuse PRBCs if hemoglobin is less than 7. Started on ferrous sulfate  supplementation.   Mild thrombocytopenia No signs of bleeding, monitor platelet count.   Hyponatremia Sodium 127, previously normal 3 months ago.  A dose of IV Lasix  given for diuresis.   F/u repeat sodium level    Elevated alkaline phosphatase Transaminases and T. bili normal.  F/u GGT.   Seizure disorder Continue Depakote    Alcohol use disorder No ongoing alcohol use as patient is currently undergoing treatment at [facility] rehab and per recent PCP note, she has been there for for the past 3 months.  She is tachycardic but no other signs of withdrawal at this time. Continue with thiamine  and folate supplementation.   ?Acute left maxillary sinusitis CT showing frothy secretions in the left maxillary sinus.  Does have significant leukocytosis on labs, however, patient denies any sinus pain/pressure or purulent nasal discharge.   Major moderate Depression/anxiety No acute issues  Disposition: Home. She came from  [facility] rehab but wants to go home on dishcarge. She would be living with a friend.   DVT prophylaxis: SCDs Start: 04/08/24 9383     Code Status: Full Code Family  Communication:  None at the bedside Status is: Inpatient Remains inpatient appropriate because: E.coli UTI    Subjective:  No acute events overnight. Appears alert and awake, denies chest pain or shortness of breath. We discussed about her leukocytosis and management plan. She had a long history of tobacco abuse (1 ppd for almost 30 years) but quit 3 months back.  Examination:  General exam: AAO X 3 Respiratory system: Clear to auscultation. Respiratory effort normal. Cardiovascular system: S1 & S2 heard, RRR. No JVD, murmurs, rubs, gallops or clicks. Trace pedal edema. Gastrointestinal system: Abdomen is nondistended, soft and nontender. No organomegaly or masses felt. Normal bowel sounds heard. Central nervous system: Alert and oriented. No focal neurological deficits. Extremities: Symmetric 5 x 5 power. Skin: No rashes, lesions or ulcers       Diet Orders (From admission, onward)     Start     Ordered   04/08/24 0616  Diet Heart Room service appropriate? Yes; Fluid consistency: Thin  Diet effective now       Question Answer Comment  Room service appropriate? Yes   Fluid consistency: Thin      04/08/24 0621            Objective: Vitals:   04/09/24 2034 04/10/24 0003 04/10/24 0434 04/10/24 0809  BP: (!) 124/58 119/63 116/70 (!) 108/56  Pulse: 100 98 (!) 107   Resp: 16 20 18 16   Temp: 99.8 F (37.7 C) 99.4 F (37.4 C) 98.1 F (36.7 C) 99 F (37.2 C)  TempSrc: Oral Oral Oral Oral  SpO2: 97% 96% 100%   Weight:   84.5 kg   Height:        Intake/Output Summary (Last 24 hours) at 04/10/2024 1007 Last data filed at 04/10/2024 9394 Gross per 24 hour  Intake 1437 ml  Output 1000 ml  Net 437 ml   Filed Weights   04/08/24 0251 04/09/24 0327 04/10/24 0434  Weight: 87.5 kg 85.8 kg 84.5 kg    Scheduled Meds:  cholecalciferol   400 Units Oral Daily   divalproex   500 mg Oral BID   doxycycline   100 mg Oral Q12H   ferrous sulfate   325 mg Oral Daily   folic acid   1  mg Oral Daily   levofloxacin   750 mg Oral Q48H   thiamine   100 mg Oral Daily   Continuous Infusions:  cefTRIAXone  (ROCEPHIN )  IV 1 g (04/10/24 0544)    Nutritional status     Body mass index is 34.07 kg/m.  Data Reviewed:   CBC: Recent Labs  Lab 04/07/24 1539 04/07/24 1605 04/08/24 1150 04/09/24 0452  WBC 20.3*  --  25.5* 27.4*  NEUTROABS 14.7*  --   --  23.0*  HGB 8.5* 9.5* 8.4* 8.2*  HCT 25.7* 28.0* 26.4* 25.0*  MCV 79.6*  --  81.5 80.1  PLT 114*  --  116* 125*   Basic Metabolic Panel: Recent Labs  Lab 04/07/24 1539 04/07/24 1605 04/08/24 1150 04/09/24 0452  NA 127* 127* 127* 131*  K 3.6 3.5 3.7 3.4*  CL 93*  --  95* 99  CO2 22  --  21* 21*  GLUCOSE 118*  --  138* 117*  BUN 39*  --  38* 48*  CREATININE 2.12*  --  2.30* 2.61*  CALCIUM  8.4*  --  8.1* 8.2*   GFR: Estimated Creatinine Clearance: 23.7 mL/min (A) (by C-G formula based on SCr of 2.61 mg/dL (H)). Liver Function Tests: Recent Labs  Lab 04/07/24 1539 04/08/24 1150  AST 16 14*  ALT 12 12  ALKPHOS 193* 165*  BILITOT 0.8 1.0  PROT 6.2* 5.7*  ALBUMIN 3.1* 2.0*   No results for input(s): LIPASE, AMYLASE in the last 168 hours. Recent Labs  Lab 04/07/24 1541 04/08/24 1150  AMMONIA 41* 45*   Coagulation Profile: No results for input(s): INR, PROTIME in the last 168 hours. Cardiac Enzymes: No results for input(s): CKTOTAL, CKMB, CKMBINDEX, TROPONINI in the last 168 hours. BNP (last 3 results) Recent Labs    04/07/24 1539  PROBNP 5,136.0*   HbA1C: No results for input(s): HGBA1C in the last 72 hours. CBG: No results for input(s): GLUCAP in the last 168 hours. Lipid Profile: No results for input(s): CHOL, HDL, LDLCALC, TRIG, CHOLHDL, LDLDIRECT in the last 72 hours. Thyroid  Function Tests: No results for input(s): TSH, T4TOTAL, FREET4, T3FREE, THYROIDAB in the last 72 hours. Anemia Panel: Recent Labs    04/07/24 1539 04/07/24 1810   VITAMINB12  --  464  FOLATE  --  31.9  FERRITIN  --  238  TIBC  --  227*  IRON  --  9*  RETICCTPCT 0.5  --    Sepsis Labs: Recent Labs  Lab 04/08/24 1150  PROCALCITON 5.84  LATICACIDVEN 1.1    Recent Results (from the past 240 hours)  SARS Coronavirus 2 by RT PCR (hospital order, performed in [facility] hospital lab) *cepheid single result test* Anterior Nasal Swab     Status: None   Collection Time: 04/07/24  3:49 PM   Specimen: Anterior Nasal Swab  Result Value Ref Range Status   SARS Coronavirus 2 by RT PCR NEGATIVE NEGATIVE Final    Comment: (NOTE) SARS-CoV-2 target nucleic acids are NOT DETECTED.  The SARS-CoV-2 RNA is generally detectable in upper and lower respiratory specimens during the acute phase of infection. The lowest concentration of SARS-CoV-2 viral copies this assay can detect is 250 copies / mL. A negative result does not preclude SARS-CoV-2 infection and should not be used as the sole basis for treatment or other patient management decisions.  A negative result may occur with improper specimen collection / handling, submission of specimen other than nasopharyngeal swab, presence of viral mutation(s) within the areas targeted by this assay, and inadequate number of viral copies (<250 copies / mL). A negative result must be combined with clinical observations, patient history, and epidemiological information.  Fact Sheet for Patients:   RoadLapTop.co.za  Fact Sheet for Healthcare Providers: http://kim-miller.com/  This test is not yet approved or  cleared by the [place]  FDA and has been authorized for detection and/or diagnosis of SARS-CoV-2 by FDA under an Emergency Use Authorization (EUA).  This EUA will remain in effect (meaning this test can be used) for the duration of the COVID-19 declaration under Section 564(b)(1) of the Act, 21 U.S.C. section 360bbb-3(b)(1), unless the authorization is  terminated or revoked sooner.  Performed at [facility], [address] [place], [place] [address]   Urine Culture (for pregnant, neutropenic or urologic patients or patients with an indwelling urinary catheter)     Status: Abnormal   Collection Time: 04/08/24  6:22 AM   Specimen: Urine, Clean Catch  Result Value Ref Range Status   Specimen Description URINE, CLEAN CATCH  Final   Special Requests  Final    NONE Performed at [facility], [address] [place], [place] [address]    Culture (A)  Final    50,000 COLONIES/mL ESCHERICHIA COLI Confirmed Extended Spectrum Beta-Lactamase Producer (ESBL).  In bloodstream infections from ESBL organisms, carbapenems are preferred over piperacillin/tazobactam. They are shown to have a lower risk of mortality.    Report Status 04/10/2024 FINAL  Final   Organism ID, Bacteria ESCHERICHIA COLI (A)  Final      Susceptibility   Escherichia coli - MIC*    AMPICILLIN  >=32 RESISTANT Resistant     CEFAZOLIN >=64 RESISTANT Resistant     CEFEPIME 16 RESISTANT Resistant     CEFTRIAXONE  >=64 RESISTANT Resistant     CIPROFLOXACIN <=0.25 SENSITIVE Sensitive     GENTAMICIN >=16 RESISTANT Resistant     IMIPENEM <=0.25 SENSITIVE Sensitive     NITROFURANTOIN  <=16 SENSITIVE Sensitive     TRIMETH/SULFA >=320 RESISTANT Resistant     AMPICILLIN /SULBACTAM >=32 RESISTANT Resistant     PIP/TAZO <=4 SENSITIVE Sensitive ug/mL    * 50,000 COLONIES/mL ESCHERICHIA COLI  Respiratory (~20 pathogens) panel by PCR     Status: None   Collection Time: 04/08/24  7:00 AM   Specimen: Nasopharyngeal Swab; Respiratory  Result Value Ref Range Status   Adenovirus NOT DETECTED NOT DETECTED Final   Coronavirus 229E NOT DETECTED NOT DETECTED Final    Comment: (NOTE) The Coronavirus on the Respiratory Panel, DOES NOT test for the novel  Coronavirus (2019 nCoV)    Coronavirus HKU1 NOT DETECTED NOT DETECTED Final   Coronavirus NL63 NOT DETECTED NOT DETECTED  Final   Coronavirus OC43 NOT DETECTED NOT DETECTED Final   Metapneumovirus NOT DETECTED NOT DETECTED Final   Rhinovirus / Enterovirus NOT DETECTED NOT DETECTED Final   Influenza A NOT DETECTED NOT DETECTED Final   Influenza B NOT DETECTED NOT DETECTED Final   Parainfluenza Virus 1 NOT DETECTED NOT DETECTED Final   Parainfluenza Virus 2 NOT DETECTED NOT DETECTED Final   Parainfluenza Virus 3 NOT DETECTED NOT DETECTED Final   Parainfluenza Virus 4 NOT DETECTED NOT DETECTED Final   Respiratory Syncytial Virus NOT DETECTED NOT DETECTED Final   Bordetella pertussis NOT DETECTED NOT DETECTED Final   Bordetella Parapertussis NOT DETECTED NOT DETECTED Final   Chlamydophila pneumoniae NOT DETECTED NOT DETECTED Final   Mycoplasma pneumoniae NOT DETECTED NOT DETECTED Final    Comment: Performed at [facility], [address], [place], [place] [address]  Culture, blood (routine x 2) Call MD if unable to obtain prior to antibiotics being given     Status: None (Preliminary result)   Collection Time: 04/08/24 11:31 AM   Specimen: BLOOD  Result Value Ref Range Status   Specimen Description BLOOD SITE NOT SPECIFIED  Final   Special Requests   Final    BOTTLES DRAWN AEROBIC AND ANAEROBIC Blood Culture adequate volume   Culture   Final    NO GROWTH 2 DAYS Performed at [facility], [address] [place], [place] [address]    Report Status PENDING  Incomplete  Culture, blood (routine x 2) Call MD if unable to obtain prior to antibiotics being given     Status: None (Preliminary result)   Collection Time: 04/08/24 11:53 AM   Specimen: BLOOD  Result Value Ref Range Status   Specimen Description BLOOD SITE NOT SPECIFIED  Final   Special Requests   Final    BOTTLES DRAWN AEROBIC AND ANAEROBIC Blood Culture  adequate volume   Culture   Final    NO GROWTH 2 DAYS Performed at [facility], [address] [place], [place] [address]    Report Status PENDING  Incomplete  MRSA Next Gen  by PCR, Nasal     Status: None   Collection Time: 04/08/24 11:45 PM   Specimen: Nasal Mucosa; Nasal Swab  Result Value Ref Range Status   MRSA by PCR Next Gen NOT DETECTED NOT DETECTED Final    Comment: (NOTE) The GeneXpert MRSA Assay (FDA approved for NASAL specimens only), is one component of a comprehensive MRSA colonization surveillance program. It is not intended to diagnose MRSA infection nor to guide or monitor treatment for MRSA infections. Test performance is not FDA approved in patients less than 87 years old. Performed at [facility], [address], [place], [place] [address]          Radiology Studies: ECHOCARDIOGRAM COMPLETE Result Date: 04/08/2024    ECHOCARDIOGRAM REPORT   Patient Name:   DARE SPILLMAN Date of Exam: 04/08/2024 Medical Rec #:  [id number]      Height:       62.0 in Accession #:    [id number]     Weight:       192.9 lb Date of Birth:  1966/04/07     BSA:          1.883 m Patient Age:    57 years       BP:           111/75 mmHg Patient Gender: F              HR:           100 bpm. Exam Location:  Inpatient Procedure: 2D Echo, Cardiac Doppler and Color Doppler (Both Spectral and Color            Flow Doppler were utilized during procedure). Indications:    CHF  History:        Patient has no prior history of Echocardiogram examinations.  Sonographer:    Carmelita Hartshorn RDCS, FE, PE Referring Phys: [id number] VASUNDHRA RATHORE IMPRESSIONS  1. Left ventricular ejection fraction, by estimation, is 60 to 65%. The left ventricle has normal function. The left ventricle has no regional wall motion abnormalities. Left ventricular diastolic parameters were normal.  2. Right ventricular systolic function is normal. The right ventricular size is normal.  3. Left atrial size was mildly dilated.  4. The mitral valve is normal in structure. Mild mitral valve regurgitation. No evidence of mitral stenosis.  5. The aortic valve is normal in structure. Aortic valve regurgitation is  not visualized. No aortic stenosis is present.  6. The inferior vena cava is normal in size with greater than 50% respiratory variability, suggesting right atrial pressure of 3 mmHg. Conclusion(s)/Recommendation(s): Technically limited study due to poor sound wave transmission. FINDINGS  Left Ventricle: Left ventricular ejection fraction, by estimation, is 60 to 65%. The left ventricle has normal function. The left ventricle has no regional wall motion abnormalities. The left ventricular internal cavity size was normal in size. There is  no left ventricular hypertrophy. Left ventricular diastolic parameters were normal. Right Ventricle: The right ventricular size is normal. No increase in right ventricular wall thickness. Right ventricular systolic function is normal. Left Atrium: Left atrial size was mildly dilated. Right Atrium: Right atrial size was normal in size. Pericardium: There is no evidence of pericardial effusion. Mitral Valve: The mitral valve is  normal in structure. Mild mitral valve regurgitation. No evidence of mitral valve stenosis. Tricuspid Valve: The tricuspid valve is normal in structure. Tricuspid valve regurgitation is mild . No evidence of tricuspid stenosis. Aortic Valve: The aortic valve is normal in structure. Aortic valve regurgitation is not visualized. No aortic stenosis is present. Aortic valve mean gradient measures 7.0 mmHg. Aortic valve peak gradient measures 12.4 mmHg. Aortic valve area, by VTI measures 2.54 cm. Pulmonic Valve: The pulmonic valve was normal in structure. Pulmonic valve regurgitation is trivial. No evidence of pulmonic stenosis. Aorta: The aortic root is normal in size and structure. Venous: The inferior vena cava is normal in size with greater than 50% respiratory variability, suggesting right atrial pressure of 3 mmHg. IAS/Shunts: No atrial level shunt detected by color flow Doppler.  LEFT VENTRICLE PLAX 2D LVIDd:         5.00 cm   Diastology LVIDs:         3.50  cm   LV e' medial:    9.36 cm/s LV PW:         1.00 cm   LV E/e' medial:  13.7 LV IVS:        0.90 cm   LV e' lateral:   11.40 cm/s LVOT diam:     2.00 cm   LV E/e' lateral: 11.2 LV SV:         68 LV SV Index:   36 LVOT Area:     3.14 cm  RIGHT VENTRICLE RV S prime:     10.00 cm/s TAPSE (M-mode): 1.8 cm LEFT ATRIUM             Index        RIGHT ATRIUM           Index LA diam:        4.40 cm 2.34 cm/m   RA Area:     19.10 cm LA Vol (A2C):   58.4 ml 31.02 ml/m  RA Volume:   47.70 ml  25.34 ml/m LA Vol (A4C):   73.1 ml 38.83 ml/m LA Biplane Vol: 66.6 ml 35.38 ml/m  AORTIC VALVE AV Area (Vmax):    2.37 cm AV Area (Vmean):   2.40 cm AV Area (VTI):     2.54 cm AV Vmax:           176.00 cm/s AV Vmean:          118.000 cm/s AV VTI:            0.270 m AV Peak Grad:      12.4 mmHg AV Mean Grad:      7.0 mmHg LVOT Vmax:         133.00 cm/s LVOT Vmean:        90.100 cm/s LVOT VTI:          0.218 m LVOT/AV VTI ratio: 0.81  AORTA Ao Root diam: 2.50 cm MITRAL VALVE MV Area (PHT): 5.02 cm     SHUNTS MV Decel Time: 151 msec     Systemic VTI:  0.22 m MV E velocity: 128.00 cm/s  Systemic Diam: 2.00 cm MV A velocity: 92.20 cm/s MV E/A ratio:  1.39 Toribio Fuel MD Electronically signed by Toribio Fuel MD Signature Date/Time: 04/08/2024/6:27:43 PM    Final    US  RENAL Result Date: 04/08/2024 CLINICAL DATA:  Acute renal insufficiency. EXAM: RENAL / URINARY TRACT ULTRASOUND COMPLETE COMPARISON:  CT abdomen pelvis dated 05/06/2022. FINDINGS: Right Kidney: Renal measurements: 10.6 x 5.0 x  5.8 cm = volume: 163 mL. Echogenicity within normal limits. No mass or hydronephrosis visualized. Left Kidney: Renal measurements: 11.1 x 6.6 x 7.9 cm = volume: 305 mL. Echogenicity within normal limits. No mass or hydronephrosis visualized. Bladder: Appears normal for degree of bladder distention. Other: None. IMPRESSION: Unremarkable renal ultrasound. Electronically Signed   By: Vanetta Chou M.D.   On: 04/08/2024 11:48         LOS: 2 days   Time spent= 40 mins    Deliliah Room, MD [facility]  If 7PM-7AM, please contact night-coverage  04/10/2024, 10:07 AM

## 2024-04-10 NOTE — Evaluation (Signed)
 Physical Therapy Evaluation Patient Details Name: Theresa Rios MRN: [id number] DOB: 05-12-1966 Today's Date: 04/10/2024  History of Present Illness  Theresa Rios is a 58 y.o. female admitted 04/07/24 for sepsis secondary to possible bacterial CAP. Pt presented with slurred speech and confusion. Found to have new onset CHF, acute hypoxemic respiratory failure, AKI, acute metabolic encephalopathy, and  E.coli UTI. PMHx: depression, anxiety, cocaine abuse, prior suicide attempt, epilepsy, expressive aphasia, hepatic encephalopathy, tobacco abuse, alcohol use disorder currently undergoing treatment at [facility].   Clinical Impression  Pt admitted with above diagnosis. PTA, pt was independent with functional mobility, ADLs, and IADLs. She has been residing at [facility] as part of a sobriety program, but plans to d/c to her friend's home. Pt currently with functional limitations due to the deficits listed below (see PT Problem List). She required CGA for transfers, gait, and stairs without AD. Pt demonstrated decreased cognition, poor safety awareness, and unsteadiness. She completed the DGI and scored 14/24, which is below the cut-off for increased fall risk (<19/24). Pt will benefit from acute skilled PT to increase her independence and safety with mobility to allow discharge. Recommend HHPT to improve balance, decrease fall risk, and optimize safety within the home environment.       If plan is discharge home, recommend the following: Supervision due to cognitive status;Help with stairs or ramp for entrance;Assist for transportation;Assistance with cooking/housework;Direct supervision/assist for medications management   Can travel by private vehicle        Equipment Recommendations None recommended by PT  Recommendations for Other Services       Functional Status Assessment Patient has had a recent decline in their functional status and demonstrates the ability to make significant  improvements in function in a reasonable and predictable amount of time.     Precautions / Restrictions Precautions Precautions: Fall Recall of Precautions/Restrictions: Impaired Restrictions Weight Bearing Restrictions Per Provider Order: No      Mobility  Bed Mobility               General bed mobility comments: Not assessed. Pt greeted in the bathroom with IV pole left plugged in next to the bed and line pulling on her arm as she sat on the commode.    Transfers Overall transfer level: Needs assistance Equipment used: None Transfers: Sit to/from Stand, Bed to chair/wheelchair/BSC Sit to Stand: Contact guard assist   Step pivot transfers: Contact guard assist       General transfer comment: Pt stood from toilet, BSC, and recliner chair by pushing up with  BUE support. No physical assist to stand. CGA for safety/stability. Good eccentric control.    Ambulation/Gait Ambulation/Gait assistance: Contact guard assist Gait Distance (Feet): 250 Feet Assistive device: None Gait Pattern/deviations: Step-through pattern, Decreased stride length, Drifts right/left Gait velocity: reduced Gait velocity interpretation: <1.8 ft/sec, indicate of risk for recurrent falls   General Gait Details: Pt ambulated with a reciprocal gait pattern, even weight shift, and good foot clearence. She demonstrated limited arm swing and would frequently pause in the hallway to use hand sanitizer. Pt unsteady, requiring CGA for safety/stability. Cues for safety awaress while PT managed IV.  Stairs Stairs: Yes Stairs assistance: Contact guard assist Stair Management: One rail Right, Forwards, Step to pattern Number of Stairs: 3 General stair comments: Pt ascended and descended leading with RLE. Cues on which direction to turn given IV. CGA for safety.  Wheelchair Mobility     Tilt Bed    Modified  Rankin (Stroke Patients Only)       Balance Overall balance assessment: Needs  assistance Sitting-balance support: No upper extremity supported, Feet supported Sitting balance-Leahy Scale: Fair Sitting balance - Comments: Pt able to reach outside her BOS and complete pericare in bathroom with close supervision for safety.   Standing balance support: No upper extremity supported, During functional activity Standing balance-Leahy Scale: Fair Standing balance comment: Pt stood at the sink to was her hands. She was unsteady during gait requiring CGA for safety.                 Standardized Balance Assessment Standardized Balance Assessment : Dynamic Gait Index   Dynamic Gait Index Level Surface: Normal Change in Gait Speed: Mild Impairment Gait with Horizontal Head Turns: Moderate Impairment Gait with Vertical Head Turns: Moderate Impairment Gait and Pivot Turn: Mild Impairment Step Over Obstacle: Mild Impairment Step Around Obstacles: Mild Impairment Steps: Moderate Impairment Total Score: 14       Pertinent Vitals/Pain Pain Assessment Pain Assessment: No/denies pain    Home Living Family/patient expects to be discharged to:: Private residence Living Arrangements: Non-relatives/Friends (Friend Willard) and his mother) Available Help at Discharge: Friend(s);Available PRN/intermittently Type of Home: House         Home Layout: One level Home Equipment: None Additional Comments: Pt plans to go back to [facility] to recieve her 90 day chip and belonings then stay with at her friend's place. She is unsure if he has any steps to enter his home.    Prior Function Prior Level of Function : Patient poor historian/Family not available;Independent/Modified Independent;History of Falls (last six months)             Mobility Comments: Ambulates without AD. Reports falls in the past 37mo, but can't recall the exact number. ADLs Comments: Indep with ADLs/IADLs.     Extremity/Trunk Assessment   Upper Extremity Assessment Upper Extremity Assessment:  Defer to OT evaluation    Lower Extremity Assessment Lower Extremity Assessment: Overall WFL for tasks assessed    Cervical / Trunk Assessment Cervical / Trunk Assessment: Normal  Communication   Communication Communication: No apparent difficulties    Cognition Arousal: Alert Behavior During Therapy: WFL for tasks assessed/performed   PT - Cognitive impairments: No family/caregiver present to determine baseline, Attention, Awareness, Safety/Judgement                       PT - Cognition Comments: Pt A,Ox4. She is easily distracted and required frequent redirection to focus on task. Pt with decreased insight into current situtation and poor safety awareness. Limited ability to engage in dual-tasking activities. Following commands: Impaired Following commands impaired: Only follows one step commands consistently, Follows multi-step commands with increased time, Follows multi-step commands inconsistently     Cueing Cueing Techniques: Verbal cues, Gestural cues, Visual cues     General Comments      Exercises     Assessment/Plan    PT Assessment Patient needs continued PT services  PT Problem List Decreased balance;Decreased mobility;Decreased cognition;Decreased safety awareness       PT Treatment Interventions DME instruction;Gait training;Stair training;Functional mobility training;Therapeutic activities;Therapeutic exercise;Balance training;Cognitive remediation;Patient/family education    PT Goals (Current goals can be found in the Care Plan section)  Acute Rehab PT Goals Patient Stated Goal: Be able to recieve all my belongings and 90 day chip before going to my friend's house PT Goal Formulation: With patient Time For Goal Achievement: 04/24/24 Potential to Achieve  Goals: Good    Frequency Min 2X/week     Co-evaluation               AM-PAC PT 6 Clicks Mobility  Outcome Measure Help needed turning from your back to your side while in a flat  bed without using bedrails?: A Little Help needed moving from lying on your back to sitting on the side of a flat bed without using bedrails?: A Little Help needed moving to and from a bed to a chair (including a wheelchair)?: A Little Help needed standing up from a chair using your arms (e.g., wheelchair or bedside chair)?: A Little Help needed to walk in hospital room?: A Little Help needed climbing 3-5 steps with a railing? : A Little 6 Click Score: 18    End of Session Equipment Utilized During Treatment: Gait belt Activity Tolerance: Patient tolerated treatment well Patient left: in chair;with call bell/phone within reach;with chair alarm set Nurse Communication: Mobility status;Other (comment) (Location of pt upon entering room, unsteadiness) PT Visit Diagnosis: Unsteadiness on feet (R26.81);Other abnormalities of gait and mobility (R26.89)    Time: 8957-8886 PT Time Calculation (min) (ACUTE ONLY): 31 min   Charges:   PT Evaluation $PT Eval Moderate Complexity: 1 Mod PT Treatments $Gait Training: 8-22 mins PT General Charges $$ ACUTE PT VISIT: 1 Visit         Randall SAUNDERS, PT, DPT Acute Rehabilitation Services Office: [phone] Secure Chat Preferred  Delon CHRISTELLA Callander 04/10/2024, 12:30 PM

## 2024-04-11 ENCOUNTER — Inpatient Hospital Stay (HOSPITAL_COMMUNITY): Payer: MEDICAID

## 2024-04-11 DIAGNOSIS — J189 Pneumonia, unspecified organism: Secondary | ICD-10-CM | POA: Diagnosis not present

## 2024-04-11 LAB — CBC WITH DIFFERENTIAL/PLATELET
Abs Granulocyte: 17.3 K/uL — ABNORMAL HIGH (ref 1.5–6.5)
Abs Immature Granulocytes: 9.32 K/uL — ABNORMAL HIGH (ref 0.00–0.07)
Basophils Absolute: 0 K/uL (ref 0.0–0.1)
Basophils Relative: 0 %
Eosinophils Absolute: 0.1 K/uL (ref 0.0–0.5)
Eosinophils Relative: 0 %
HCT: 23.4 % — ABNORMAL LOW (ref 36.0–46.0)
Hemoglobin: 7.7 g/dL — ABNORMAL LOW (ref 12.0–15.0)
Immature Granulocytes: 29 %
Lymphocytes Relative: 7 %
Lymphs Abs: 2.4 K/uL (ref 0.7–4.0)
MCH: 26.3 pg (ref 26.0–34.0)
MCHC: 32.9 g/dL (ref 30.0–36.0)
MCV: 79.9 fL — ABNORMAL LOW (ref 80.0–100.0)
Monocytes Absolute: 3 K/uL — ABNORMAL HIGH (ref 0.1–1.0)
Monocytes Relative: 9 %
Neutro Abs: 17.3 K/uL — ABNORMAL HIGH (ref 1.7–7.7)
Neutrophils Relative %: 55 %
Platelets: 190 K/uL (ref 150–400)
RBC: 2.93 MIL/uL — ABNORMAL LOW (ref 3.87–5.11)
RDW: 14.9 % (ref 11.5–15.5)
Smear Review: NORMAL
WBC: 32.1 K/uL — ABNORMAL HIGH (ref 4.0–10.5)
nRBC: 0 % (ref 0.0–0.2)

## 2024-04-11 LAB — BASIC METABOLIC PANEL WITH GFR
Anion gap: 11 (ref 5–15)
BUN: 44 mg/dL — ABNORMAL HIGH (ref 6–20)
CO2: 19 mmol/L — ABNORMAL LOW (ref 22–32)
Calcium: 8.1 mg/dL — ABNORMAL LOW (ref 8.9–10.3)
Chloride: 107 mmol/L (ref 98–111)
Creatinine, Ser: 2.39 mg/dL — ABNORMAL HIGH (ref 0.44–1.00)
GFR, Estimated: 23 mL/min — ABNORMAL LOW (ref >60)
Glucose, Bld: 120 mg/dL — ABNORMAL HIGH (ref 70–99)
Potassium: 4.1 mmol/L (ref 3.5–5.1)
Sodium: 137 mmol/L (ref 135–145)

## 2024-04-11 LAB — MAGNESIUM: Magnesium: 2.6 mg/dL — ABNORMAL HIGH (ref 1.7–2.4)

## 2024-04-11 NOTE — Plan of Care (Signed)
   Problem: Activity: Goal: Risk for activity intolerance will decrease Outcome: Progressing   Problem: Nutrition: Goal: Adequate nutrition will be maintained Outcome: Progressing

## 2024-04-11 NOTE — Progress Notes (Signed)
 PROGRESS NOTE    Theresa Rios  FMW:[id number] DOB: 03/08/1966 DOA: 04/07/2024 PCP: Scarlett Ronal Caldron, NP   Brief Narrative:   58 y.o. female with medical history significant of depression, anxiety, cocaine abuse, prior suicide attempt, epilepsy, expressive aphasia, hepatic encephalopathy, tobacco abuse, alcohol use disorder currently undergoing treatment at [facility] rehab.  She was sent to the ED yesterday from rehab for evaluation of slurred speech and confusion. Being treate for possible pneumonia and ESBL. E.coli UTI.  Assessment & Plan:  Principal Problem:   CAP (community acquired pneumonia) Active Problems:   Acute hypoxemic respiratory failure (HCC)   Acute anemia   Sepsis (HCC)   AKI (acute kidney injury) (HCC)   Acute encephalopathy   Thrombocytopenia (HCC)   Sepsis secondary to possible bacterial CAP/UTI: upward trending WBC in the absence of fever Chest x-ray showing bilateral perihilar interstitial opacities suspicious for possible pneumonia.   SARS-CoV-2 PCR negative.  Blood cultures ordered-NGTD Continue antibiotics including levofloxacin  and doxycycline  (avoiding macrolides due to borderline QT prolongation and EKG).  Dced Rocephin  on 8/2. MRSA PCR screen negative, strep pneumo (negative), Legionella urinary antigens, and procalcitonin level (5.8).  Urine culture grew 50000/ml colonies of ESBL. E.coli  ESBL. E.coli: Started on oral levofloxacin  750 mg Q48 hrs on 8/2. Contact precautions.   Likely New onset acute diastolic CHF,POA:   No documented history of CHF or previous echo results in the chart.   proBNP 5136 and chest x-ray with possible pulmonary edema.   Echocardiogram showed normal EF and no significant valvular abnormalities.   Monitor intake and output, daily weights, renal function. Fluid restriction of 1.5 L per day   Acute hypoxemic respiratory failure,POA:  Likely multifactorial from pneumonia and acute CHF. Currently, stable on 2 L [place].   VBG  without evidence of hypercarbia.   AKI ?Pre-renal vs cardiorenal in the setting of acute CHF.  Creatinine 2.3 today down from 2.7 (baseline 0.5-0.7).  A dose of IV Lasix  given on 7/31. Renal ultrasound is unremarkable. F/u urine sodium (less than 10) and osmolality(136) F/u renal panel in am   Acute metabolic encephalopathy: Likely in the setting of UTI, resolved CT head negative for acute intracranial abnormality and no focal neurodeficit on exam.  She is on Depakote  due to known history of seizure disorder and valproic  acid level within normal range.    UDS negative.   UA with negative nitrite, small amount of leukocytes, and microscopy with >50 WBCs and few bacteria. Continue antibiotics   Microcytic anemia/iron deficiency anemia: Hemoglobin 8.5 (previously 14.4 on labs 3 months ago). No overt bleeding and FOBT negative.  Monitor H&H and transfuse PRBCs if hemoglobin is less than 7. Started on ferrous sulfate  supplementation.   Mild thrombocytopenia No signs of bleeding, monitor platelet count.   Hyponatremia Sodium 127, previously normal 3 months ago.  A dose of IV Lasix  given for diuresis.   F/u repeat sodium level    Elevated alkaline phosphatase Transaminases and T. bili normal.  F/u GGT.   Seizure disorder Continue Depakote    Alcohol use disorder No ongoing alcohol use as patient is currently undergoing treatment at [facility] rehab and per recent PCP note, she has been there for for the past 3 months.  She is tachycardic but no other signs of withdrawal at this time. Continue with thiamine  and folate supplementation.   Possible Acute left maxillary sinusitis CT showing frothy secretions in the left maxillary sinus.  Does have significant leukocytosis on labs, however, patient denies any sinus  pain/pressure or purulent nasal discharge.   Major moderate Depression/anxiety No acute issues  Disposition: Home. She came from [facility] rehab but wants to go home on dishcarge.  She would be living with a friend.   DVT prophylaxis: SCDs Start: 04/08/24 9383     Code Status: Full Code Family Communication:  None at the bedside Status is: Inpatient Remains inpatient appropriate because: E.coli UTI    Subjective:  No acute events overnight. Denies chest pain, shortness of breath, vomiting or diarrhea. She denies dysuria, increased urinary frequency or lower abdominal pain.  Examination:  General exam: AAO X 3 Respiratory system: Clear to auscultation. Respiratory effort normal. Cardiovascular system: S1 & S2 heard, RRR. No JVD, murmurs, rubs, gallops or clicks. Trace pedal edema. Gastrointestinal system: Abdomen is nondistended, soft and nontender. No organomegaly or masses felt. Normal bowel sounds heard. Central nervous system: Alert and oriented. No focal neurological deficits. Extremities: Symmetric 5 x 5 power. Skin: No rashes, lesions or ulcers       Diet Orders (From admission, onward)     Start     Ordered   04/08/24 0616  Diet Heart Room service appropriate? Yes; Fluid consistency: Thin  Diet effective now       Question Answer Comment  Room service appropriate? Yes   Fluid consistency: Thin      04/08/24 0621            Objective: Vitals:   04/11/24 0356 04/11/24 0419 04/11/24 0500 04/11/24 0816  BP:  107/71  (!) 122/57  Pulse:  94    Resp: 18 (!) 22    Temp:  98.3 F (36.8 C)  98.3 F (36.8 C)  TempSrc:  Oral  Oral  SpO2:  94%  95%  Weight:   83.1 kg   Height:        Intake/Output Summary (Last 24 hours) at 04/11/2024 0901 Last data filed at 04/11/2024 0816 Gross per 24 hour  Intake 1020 ml  Output 1700 ml  Net -680 ml   Filed Weights   04/09/24 0327 04/10/24 0434 04/11/24 0500  Weight: 85.8 kg 84.5 kg 83.1 kg    Scheduled Meds:  cholecalciferol   400 Units Oral Daily   divalproex   500 mg Oral BID   ferrous sulfate   325 mg Oral Daily   folic acid   1 mg Oral Daily   levofloxacin   750 mg Oral Q48H   thiamine    100 mg Oral Daily   Continuous Infusions:    Nutritional status     Body mass index is 33.51 kg/m.  Data Reviewed:   CBC: Recent Labs  Lab 04/07/24 1539 04/07/24 1605 04/08/24 1150 04/09/24 0452 04/10/24 0928 04/11/24 0406  WBC 20.3*  --  25.5* 27.4* 29.8* 32.1*  NEUTROABS 14.7*  --   --  23.0* 22.9* 17.3*  HGB 8.5* 9.5* 8.4* 8.2* 7.9* 7.7*  HCT 25.7* 28.0* 26.4* 25.0* 24.3* 23.4*  MCV 79.6*  --  81.5 80.1 80.5 79.9*  PLT 114*  --  116* 125* 169 190   Basic Metabolic Panel: Recent Labs  Lab 04/07/24 1539 04/07/24 1605 04/08/24 1150 04/09/24 0452 04/10/24 0928 04/11/24 0406  NA 127* 127* 127* 131* 135 137  K 3.6 3.5 3.7 3.4* 3.7 4.1  CL 93*  --  95* 99 103 107  CO2 22  --  21* 21* 19* 19*  GLUCOSE 118*  --  138* 117* 122* 120*  BUN 39*  --  38* 48* 51* 44*  CREATININE 2.12*  --  2.30* 2.61* 2.57* 2.39*  CALCIUM  8.4*  --  8.1* 8.2* 8.3* 8.1*  MG  --   --   --   --   --  2.6*   GFR: Estimated Creatinine Clearance: 25.6 mL/min (A) (by C-G formula based on SCr of 2.39 mg/dL (H)). Liver Function Tests: Recent Labs  Lab 04/07/24 1539 04/08/24 1150 04/10/24 0928  AST 16 14* 14*  ALT 12 12 11   ALKPHOS 193* 165* 157*  BILITOT 0.8 1.0 0.7  PROT 6.2* 5.7* 5.8*  ALBUMIN 3.1* 2.0* 1.9*   No results for input(s): LIPASE, AMYLASE in the last 168 hours. Recent Labs  Lab 04/07/24 1541 04/08/24 1150  AMMONIA 41* 45*   Coagulation Profile: No results for input(s): INR, PROTIME in the last 168 hours. Cardiac Enzymes: No results for input(s): CKTOTAL, CKMB, CKMBINDEX, TROPONINI in the last 168 hours. BNP (last 3 results) Recent Labs    04/07/24 1539  PROBNP 5,136.0*   HbA1C: No results for input(s): HGBA1C in the last 72 hours. CBG: No results for input(s): GLUCAP in the last 168 hours. Lipid Profile: No results for input(s): CHOL, HDL, LDLCALC, TRIG, CHOLHDL, LDLDIRECT in the last 72 hours. Thyroid  Function Tests: No  results for input(s): TSH, T4TOTAL, FREET4, T3FREE, THYROIDAB in the last 72 hours. Anemia Panel: No results for input(s): VITAMINB12, FOLATE, FERRITIN, TIBC, IRON, RETICCTPCT in the last 72 hours.  Sepsis Labs: Recent Labs  Lab 04/08/24 1150  PROCALCITON 5.84  LATICACIDVEN 1.1    Recent Results (from the past 240 hours)  SARS Coronavirus 2 by RT PCR (hospital order, performed in [facility] hospital lab) *cepheid single result test* Anterior Nasal Swab     Status: None   Collection Time: 04/07/24  3:49 PM   Specimen: Anterior Nasal Swab  Result Value Ref Range Status   SARS Coronavirus 2 by RT PCR NEGATIVE NEGATIVE Final    Comment: (NOTE) SARS-CoV-2 target nucleic acids are NOT DETECTED.  The SARS-CoV-2 RNA is generally detectable in upper and lower respiratory specimens during the acute phase of infection. The lowest concentration of SARS-CoV-2 viral copies this assay can detect is 250 copies / mL. A negative result does not preclude SARS-CoV-2 infection and should not be used as the sole basis for treatment or other patient management decisions.  A negative result may occur with improper specimen collection / handling, submission of specimen other than nasopharyngeal swab, presence of viral mutation(s) within the areas targeted by this assay, and inadequate number of viral copies (<250 copies / mL). A negative result must be combined with clinical observations, patient history, and epidemiological information.  Fact Sheet for Patients:   RoadLapTop.co.za  Fact Sheet for Healthcare Providers: http://kim-miller.com/  This test is not yet approved or  cleared by the [place]  FDA and has been authorized for detection and/or diagnosis of SARS-CoV-2 by FDA under an Emergency Use Authorization (EUA).  This EUA will remain in effect (meaning this test can be used) for the duration of the COVID-19 declaration  under Section 564(b)(1) of the Act, 21 U.S.C. section 360bbb-3(b)(1), unless the authorization is terminated or revoked sooner.  Performed at [facility], [address] [place], [place] [address]   Urine Culture (for pregnant, neutropenic or urologic patients or patients with an indwelling urinary catheter)     Status: Abnormal   Collection Time: 04/08/24  6:22 AM   Specimen: Urine, Clean Catch  Result Value Ref Range Status   Specimen Description URINE, CLEAN CATCH  Final   Special Requests   Final    NONE Performed at [facility], [address] [place], [place] [address]    Culture (A)  Final    50,000 COLONIES/mL ESCHERICHIA COLI Confirmed Extended Spectrum Beta-Lactamase Producer (ESBL).  In bloodstream infections from ESBL organisms, carbapenems are preferred over piperacillin/tazobactam. They are shown to have a lower risk of mortality.    Report Status 04/10/2024 FINAL  Final   Organism ID, Bacteria ESCHERICHIA COLI (A)  Final      Susceptibility   Escherichia coli - MIC*    AMPICILLIN  >=32 RESISTANT Resistant     CEFAZOLIN >=64 RESISTANT Resistant     CEFEPIME 16 RESISTANT Resistant     CEFTRIAXONE  >=64 RESISTANT Resistant     CIPROFLOXACIN <=0.25 SENSITIVE Sensitive     GENTAMICIN >=16 RESISTANT Resistant     IMIPENEM <=0.25 SENSITIVE Sensitive     NITROFURANTOIN  <=16 SENSITIVE Sensitive     TRIMETH/SULFA >=320 RESISTANT Resistant     AMPICILLIN /SULBACTAM >=32 RESISTANT Resistant     PIP/TAZO <=4 SENSITIVE Sensitive ug/mL    * 50,000 COLONIES/mL ESCHERICHIA COLI  Respiratory (~20 pathogens) panel by PCR     Status: None   Collection Time: 04/08/24  7:00 AM   Specimen: Nasopharyngeal Swab; Respiratory  Result Value Ref Range Status   Adenovirus NOT DETECTED NOT DETECTED Final   Coronavirus 229E NOT DETECTED NOT DETECTED Final    Comment: (NOTE) The Coronavirus on the Respiratory Panel, DOES NOT test for the novel  Coronavirus (2019 nCoV)     Coronavirus HKU1 NOT DETECTED NOT DETECTED Final   Coronavirus NL63 NOT DETECTED NOT DETECTED Final   Coronavirus OC43 NOT DETECTED NOT DETECTED Final   Metapneumovirus NOT DETECTED NOT DETECTED Final   Rhinovirus / Enterovirus NOT DETECTED NOT DETECTED Final   Influenza A NOT DETECTED NOT DETECTED Final   Influenza B NOT DETECTED NOT DETECTED Final   Parainfluenza Virus 1 NOT DETECTED NOT DETECTED Final   Parainfluenza Virus 2 NOT DETECTED NOT DETECTED Final   Parainfluenza Virus 3 NOT DETECTED NOT DETECTED Final   Parainfluenza Virus 4 NOT DETECTED NOT DETECTED Final   Respiratory Syncytial Virus NOT DETECTED NOT DETECTED Final   Bordetella pertussis NOT DETECTED NOT DETECTED Final   Bordetella Parapertussis NOT DETECTED NOT DETECTED Final   Chlamydophila pneumoniae NOT DETECTED NOT DETECTED Final   Mycoplasma pneumoniae NOT DETECTED NOT DETECTED Final    Comment: Performed at [facility], [address], [place], [place] [address]  Culture, blood (routine x 2) Call MD if unable to obtain prior to antibiotics being given     Status: None (Preliminary result)   Collection Time: 04/08/24 11:31 AM   Specimen: BLOOD  Result Value Ref Range Status   Specimen Description BLOOD SITE NOT SPECIFIED  Final   Special Requests   Final    BOTTLES DRAWN AEROBIC AND ANAEROBIC Blood Culture adequate volume   Culture   Final    NO GROWTH 2 DAYS Performed at [facility], [address], [place], [place] [address]    Report Status PENDING  Incomplete  Culture, blood (routine x 2) Call MD if unable to obtain prior to antibiotics being given     Status: None (Preliminary result)   Collection Time: 04/08/24 11:53 AM   Specimen: BLOOD  Result Value Ref Range Status   Specimen Description BLOOD SITE NOT SPECIFIED  Final   Special Requests   Final  BOTTLES DRAWN AEROBIC AND ANAEROBIC Blood Culture adequate volume   Culture   Final    NO GROWTH 2 DAYS Performed at [facility]  Lab, [address] [place], [place] [address]    Report Status PENDING  Incomplete  MRSA Next Gen by PCR, Nasal     Status: None   Collection Time: 04/08/24 11:45 PM   Specimen: Nasal Mucosa; Nasal Swab  Result Value Ref Range Status   MRSA by PCR Next Gen NOT DETECTED NOT DETECTED Final    Comment: (NOTE) The GeneXpert MRSA Assay (FDA approved for NASAL specimens only), is one component of a comprehensive MRSA colonization surveillance program. It is not intended to diagnose MRSA infection nor to guide or monitor treatment for MRSA infections. Test performance is not FDA approved in patients less than 69 years old. Performed at [facility], [address] [place], [place] [address]          Radiology Studies: No results found.       LOS: 3 days   Time spent= 40 mins    Deliliah Room, MD [facility]  If 7PM-7AM, please contact night-coverage  04/11/2024, 9:01 AM

## 2024-04-11 NOTE — Plan of Care (Signed)
   Problem: Education: Goal: Knowledge of General Education information will improve Description Including pain rating scale, medication(s)/side effects and non-pharmacologic comfort measures Outcome: Progressing   Problem: Health Behavior/Discharge Planning: Goal: Ability to manage health-related needs will improve Outcome: Progressing

## 2024-04-12 ENCOUNTER — Encounter (HOSPITAL_COMMUNITY): Payer: Self-pay | Admitting: Internal Medicine

## 2024-04-12 ENCOUNTER — Inpatient Hospital Stay (HOSPITAL_COMMUNITY): Payer: MEDICAID

## 2024-04-12 DIAGNOSIS — N179 Acute kidney failure, unspecified: Secondary | ICD-10-CM | POA: Diagnosis not present

## 2024-04-12 DIAGNOSIS — R569 Unspecified convulsions: Secondary | ICD-10-CM

## 2024-04-12 DIAGNOSIS — K089 Disorder of teeth and supporting structures, unspecified: Secondary | ICD-10-CM

## 2024-04-12 DIAGNOSIS — F1090 Alcohol use, unspecified, uncomplicated: Secondary | ICD-10-CM

## 2024-04-12 DIAGNOSIS — Z22358 Carrier of other enterobacterales: Secondary | ICD-10-CM

## 2024-04-12 DIAGNOSIS — D72829 Elevated white blood cell count, unspecified: Secondary | ICD-10-CM | POA: Diagnosis present

## 2024-04-12 LAB — CBC WITH DIFFERENTIAL/PLATELET
Abs Immature Granulocytes: 10.84 K/uL — ABNORMAL HIGH (ref 0.00–0.07)
Basophils Absolute: 0 K/uL (ref 0.0–0.1)
Basophils Relative: 0 %
Eosinophils Absolute: 0.1 K/uL (ref 0.0–0.5)
Eosinophils Relative: 0 %
HCT: 23.8 % — ABNORMAL LOW (ref 36.0–46.0)
Hemoglobin: 7.8 g/dL — ABNORMAL LOW (ref 12.0–15.0)
Immature Granulocytes: 30 %
Lymphocytes Relative: 7 %
Lymphs Abs: 2.4 K/uL (ref 0.7–4.0)
MCH: 26.2 pg (ref 26.0–34.0)
MCHC: 32.8 g/dL (ref 30.0–36.0)
MCV: 79.9 fL — ABNORMAL LOW (ref 80.0–100.0)
Monocytes Absolute: 3.7 K/uL — ABNORMAL HIGH (ref 0.1–1.0)
Monocytes Relative: 10 %
Neutro Abs: 19.3 K/uL — ABNORMAL HIGH (ref 1.7–7.7)
Neutrophils Relative %: 53 %
Platelets: 250 K/uL (ref 150–400)
RBC: 2.98 MIL/uL — ABNORMAL LOW (ref 3.87–5.11)
RDW: 15.3 % (ref 11.5–15.5)
Smear Review: NORMAL
WBC: 36.4 K/uL — ABNORMAL HIGH (ref 4.0–10.5)
nRBC: 0 % (ref 0.0–0.2)

## 2024-04-12 LAB — BASIC METABOLIC PANEL WITH GFR
Anion gap: 11 (ref 5–15)
BUN: 37 mg/dL — ABNORMAL HIGH (ref 6–20)
CO2: 21 mmol/L — ABNORMAL LOW (ref 22–32)
Calcium: 8.1 mg/dL — ABNORMAL LOW (ref 8.9–10.3)
Chloride: 108 mmol/L (ref 98–111)
Creatinine, Ser: 2.47 mg/dL — ABNORMAL HIGH (ref 0.44–1.00)
GFR, Estimated: 22 mL/min — ABNORMAL LOW (ref >60)
Glucose, Bld: 203 mg/dL — ABNORMAL HIGH (ref 70–99)
Potassium: 4.8 mmol/L (ref 3.5–5.1)
Sodium: 140 mmol/L (ref 135–145)

## 2024-04-12 NOTE — Progress Notes (Signed)
 Physical Therapy Treatment Patient Details Name: Theresa Rios MRN: [id number] DOB: 1966-05-23 Today's Date: 04/12/2024   History of Present Illness Theresa Rios is a 58 y.o. female admitted 04/07/24 for sepsis secondary to possible bacterial CAP. Pt presented with slurred speech and confusion. Found to have new onset CHF, acute hypoxemic respiratory failure, AKI, acute metabolic encephalopathy, and  E.coli UTI. PMHx: depression, anxiety, cocaine abuse, prior suicide attempt, epilepsy, expressive aphasia, hepatic encephalopathy, tobacco abuse, alcohol use disorder currently undergoing treatment at [facility].    PT Comments  Pt doing very well with mobility and likely very close to her baseline. From PT standpoint ready for dc when medically ready. Does not need any PT follow up after DC.     If plan is discharge home, recommend the following:     Can travel by private vehicle        Equipment Recommendations  None recommended by PT    Recommendations for Other Services       Precautions / Restrictions Precautions Precautions: None Restrictions Weight Bearing Restrictions Per Provider Order: No     Mobility  Bed Mobility Overal bed mobility: Independent                  Transfers Overall transfer level: Modified independent Equipment used: None Transfers: Sit to/from Stand Sit to Stand: Modified independent (Device/Increase time)                Ambulation/Gait Ambulation/Gait assistance: Supervision Gait Distance (Feet): 150 Feet Assistive device: None Gait Pattern/deviations: Decreased stride length Gait velocity: decr Gait velocity interpretation: >2.62 ft/sec, indicative of community ambulatory   General Gait Details: Steady gait with supervision only for lines.   Stairs             Wheelchair Mobility     Tilt Bed    Modified Rankin (Stroke Patients Only)       Balance Overall balance assessment: Needs  assistance Sitting-balance support: No upper extremity supported Sitting balance-Leahy Scale: Normal       Standing balance-Leahy Scale: Good               High level balance activites: Side stepping, Backward walking, Direction changes, Turns High Level Balance Comments: No loss of balance or instability            Communication Communication Communication: No apparent difficulties  Cognition Arousal: Alert Behavior During Therapy: WFL for tasks assessed/performed   PT - Cognitive impairments: No apparent impairments                         Following commands: Intact      Cueing    Exercises      General Comments        Pertinent Vitals/Pain Pain Assessment Pain Assessment: No/denies pain    Home Living                          Prior Function            PT Goals (current goals can now be found in the care plan section) Acute Rehab PT Goals Patient Stated Goal: go to friend's home Progress towards PT goals: Progressing toward goals    Frequency    Min 2X/week      PT Plan      Co-evaluation              AM-PAC PT 6 Clicks  Mobility   Outcome Measure  Help needed turning from your back to your side while in a flat bed without using bedrails?: None Help needed moving from lying on your back to sitting on the side of a flat bed without using bedrails?: None Help needed moving to and from a bed to a chair (including a wheelchair)?: None Help needed standing up from a chair using your arms (e.g., wheelchair or bedside chair)?: None Help needed to walk in hospital room?: None Help needed climbing 3-5 steps with a railing? : A Little 6 Click Score: 23    End of Session Equipment Utilized During Treatment: Gait belt Activity Tolerance: Patient tolerated treatment well Patient left: in chair;with call bell/phone within reach   PT Visit Diagnosis: Other abnormalities of gait and mobility (R26.89)     Time:  8697-8671 PT Time Calculation (min) (ACUTE ONLY): 26 min  Charges:    $Gait Training: 23-37 mins PT General Charges $$ ACUTE PT VISIT: 1 Visit                     [facility] PT Acute Rehabilitation Services Office [phone]    [facility] 04/12/2024, 2:25 PM

## 2024-04-12 NOTE — Progress Notes (Signed)
 PROGRESS NOTE    Theresa Rios  FMW:[id number] DOB: 1965-12-21 DOA: 04/07/2024 PCP: Scarlett Ronal Caldron, NP   Brief Narrative:   58 y.o. female with medical history significant of depression, anxiety, cocaine abuse, prior suicide attempt, epilepsy, expressive aphasia, hepatic encephalopathy, tobacco abuse, alcohol use disorder currently undergoing treatment at [facility] rehab.  She was sent to the ED yesterday from rehab for evaluation of slurred speech and confusion. Being treate for possible pneumonia and ESBL. E.coli UTI.  Assessment & Plan:  Principal Problem:   CAP (community acquired pneumonia) Active Problems:   Acute hypoxemic respiratory failure (HCC)   Acute anemia   Sepsis (HCC)   AKI (acute kidney injury) (HCC)   Acute encephalopathy   Thrombocytopenia (HCC)   Sepsis secondary to possible bacterial CAP/UTI: upward trending WBC in the absence of fever Chest x-ray showing bilateral perihilar interstitial opacities suspicious for possible pneumonia.   SARS-CoV-2 PCR negative.  Blood cultures ordered-NGTD Continue antibiotics including levofloxacin  and doxycycline  (avoiding macrolides due to borderline QT prolongation and EKG).  Dced Rocephin  on 8/2. MRSA PCR screen negative, strep pneumo (negative), Legionella urinary antigens, and procalcitonin level (5.8).  Urine culture grew 50000/ml colonies of ESBL. E.coli Case discussed with ID Dr Fleeta Rothman.   ESBL. E.coli, asymptomatic: Started on oral levofloxacin  750 mg Q48 hrs on 8/2. Contact precautions.   Likely New onset acute diastolic CHF,POA:   No documented history of CHF or previous echo results in the chart.   proBNP 5136 and chest x-ray with possible pulmonary edema.   Echocardiogram showed normal EF and no significant valvular abnormalities.   Monitor intake and output, daily weights, renal function. Fluid restriction of 1.5 L per day   Acute hypoxemic respiratory failure,POA:  Likely multifactorial from pneumonia and  acute CHF. Currently, stable on 2 L [place].   VBG without evidence of hypercarbia.   AKI ?Pre-renal vs cardiorenal in the setting of acute CHF.  Creatinine 2.3 down from 2.7 (baseline 0.5-0.7).  A dose of IV Lasix  given on 7/31. Renal ultrasound is unremarkable. F/u urine sodium (less than 10) and osmolality(136) F/u renal panel in am   Acute metabolic encephalopathy: Likely in the setting of UTI, resolved CT head negative for acute intracranial abnormality and no focal neurodeficit on exam.  She is on Depakote  due to known history of seizure disorder and valproic  acid level within normal range.    UDS negative.   UA with negative nitrite, small amount of leukocytes, and microscopy with >50 WBCs and few bacteria. Continue antibiotics   Microcytic anemia/iron deficiency anemia: Hemoglobin 8.5 (previously 14.4 on labs 3 months ago). No overt bleeding and FOBT negative.  Monitor H&H and transfuse PRBCs if hemoglobin is less than 7. Started on ferrous sulfate  supplementation.   Mild thrombocytopenia No signs of bleeding, monitor platelet count.   Hyponatremia Sodium 127, previously normal 3 months ago.  A dose of IV Lasix  given for diuresis.   F/u repeat sodium level    Elevated alkaline phosphatase Transaminases and T. bili normal.  F/u GGT.   Seizure disorder Continue Depakote    Alcohol use disorder No ongoing alcohol use as patient is currently undergoing treatment at [facility] rehab and per recent PCP note, she has been there for for the past 3 months.  She is tachycardic but no other signs of withdrawal at this time. Continue with thiamine  and folate supplementation.   Possible Acute left maxillary sinusitis CT showing frothy secretions in the left maxillary sinus.  Does have significant  leukocytosis on labs, however, patient denies any sinus pain/pressure or purulent nasal discharge.   Major moderate Depression/anxiety No acute issues  Disposition: Home. She came from [facility]  rehab but wants to go home on dishcarge. She would be living with a friend.   DVT prophylaxis: SCDs Start: 04/08/24 9383     Code Status: Full Code Family Communication:  None at the bedside Status is: Inpatient Remains inpatient appropriate because: E.coli UTI    Subjective:  No acute events overnight. She feels great. Denies chest pain, shortness of breath, diarrhea or abdominal pain. We discussed about her upwards trending WBC count and need for ID evaluation.   Examination:  General exam: AAO X 3 Respiratory system: Clear to auscultation. Respiratory effort normal. Cardiovascular system: S1 & S2 heard, RRR. No JVD, murmurs, rubs, gallops or clicks. Trace pedal edema. Gastrointestinal system: Abdomen is nondistended, soft and nontender. No organomegaly or masses felt. Normal bowel sounds heard. Central nervous system: Alert and oriented. No focal neurological deficits. Extremities: Symmetric 5 x 5 power. Skin: No rashes, lesions or ulcers       Diet Orders (From admission, onward)     Start     Ordered   04/08/24 0616  Diet Heart Room service appropriate? Yes; Fluid consistency: Thin  Diet effective now       Question Answer Comment  Room service appropriate? Yes   Fluid consistency: Thin      04/08/24 0621            Objective: Vitals:   04/11/24 2355 04/12/24 0326 04/12/24 0500 04/12/24 0844  BP: (!) 112/56 125/69  123/64  Pulse: (!) 106 (!) 107  (!) 106  Resp: 18 (!) 21  10  Temp: 98.9 F (37.2 C) 98.7 F (37.1 C)  98.2 F (36.8 C)  TempSrc: Oral Oral  Oral  SpO2: 97% 97%  98%  Weight:   82.3 kg   Height:        Intake/Output Summary (Last 24 hours) at 04/12/2024 0954 Last data filed at 04/12/2024 0851 Gross per 24 hour  Intake 900 ml  Output 4550 ml  Net -3650 ml   Filed Weights   04/10/24 0434 04/11/24 0500 04/12/24 0500  Weight: 84.5 kg 83.1 kg 82.3 kg    Scheduled Meds:  cholecalciferol   400 Units Oral Daily   divalproex   500 mg Oral  BID   ferrous sulfate   325 mg Oral Daily   folic acid   1 mg Oral Daily   levofloxacin   750 mg Oral Q48H   thiamine   100 mg Oral Daily   Continuous Infusions:    Nutritional status     Body mass index is 33.2 kg/m.  Data Reviewed:   CBC: Recent Labs  Lab 04/07/24 1539 04/07/24 1605 04/08/24 1150 04/09/24 0452 04/10/24 0928 04/11/24 0406 04/12/24 0508  WBC 20.3*  --  25.5* 27.4* 29.8* 32.1* 36.4*  NEUTROABS 14.7*  --   --  23.0* 22.9* 17.3* 19.3*  HGB 8.5*   < > 8.4* 8.2* 7.9* 7.7* 7.8*  HCT 25.7*   < > 26.4* 25.0* 24.3* 23.4* 23.8*  MCV 79.6*  --  81.5 80.1 80.5 79.9* 79.9*  PLT 114*  --  116* 125* 169 190 250   < > = values in this interval not displayed.   Basic Metabolic Panel: Recent Labs  Lab 04/07/24 1539 04/07/24 1605 04/08/24 1150 04/09/24 0452 04/10/24 0928 04/11/24 0406  NA 127* 127* 127* 131* 135 137  K 3.6 3.5  3.7 3.4* 3.7 4.1  CL 93*  --  95* 99 103 107  CO2 22  --  21* 21* 19* 19*  GLUCOSE 118*  --  138* 117* 122* 120*  BUN 39*  --  38* 48* 51* 44*  CREATININE 2.12*  --  2.30* 2.61* 2.57* 2.39*  CALCIUM  8.4*  --  8.1* 8.2* 8.3* 8.1*  MG  --   --   --   --   --  2.6*   GFR: Estimated Creatinine Clearance: 25.5 mL/min (A) (by C-G formula based on SCr of 2.39 mg/dL (H)). Liver Function Tests: Recent Labs  Lab 04/07/24 1539 04/08/24 1150 04/10/24 0928  AST 16 14* 14*  ALT 12 12 11   ALKPHOS 193* 165* 157*  BILITOT 0.8 1.0 0.7  PROT 6.2* 5.7* 5.8*  ALBUMIN 3.1* 2.0* 1.9*   No results for input(s): LIPASE, AMYLASE in the last 168 hours. Recent Labs  Lab 04/07/24 1541 04/08/24 1150  AMMONIA 41* 45*   Coagulation Profile: No results for input(s): INR, PROTIME in the last 168 hours. Cardiac Enzymes: No results for input(s): CKTOTAL, CKMB, CKMBINDEX, TROPONINI in the last 168 hours. BNP (last 3 results) Recent Labs    04/07/24 1539  PROBNP 5,136.0*   HbA1C: No results for input(s): HGBA1C in the last 72  hours. CBG: No results for input(s): GLUCAP in the last 168 hours. Lipid Profile: No results for input(s): CHOL, HDL, LDLCALC, TRIG, CHOLHDL, LDLDIRECT in the last 72 hours. Thyroid  Function Tests: No results for input(s): TSH, T4TOTAL, FREET4, T3FREE, THYROIDAB in the last 72 hours. Anemia Panel: No results for input(s): VITAMINB12, FOLATE, FERRITIN, TIBC, IRON, RETICCTPCT in the last 72 hours.  Sepsis Labs: Recent Labs  Lab 04/08/24 1150  PROCALCITON 5.84  LATICACIDVEN 1.1    Recent Results (from the past 240 hours)  SARS Coronavirus 2 by RT PCR (hospital order, performed in [facility] hospital lab) *cepheid single result test* Anterior Nasal Swab     Status: None   Collection Time: 04/07/24  3:49 PM   Specimen: Anterior Nasal Swab  Result Value Ref Range Status   SARS Coronavirus 2 by RT PCR NEGATIVE NEGATIVE Final    Comment: (NOTE) SARS-CoV-2 target nucleic acids are NOT DETECTED.  The SARS-CoV-2 RNA is generally detectable in upper and lower respiratory specimens during the acute phase of infection. The lowest concentration of SARS-CoV-2 viral copies this assay can detect is 250 copies / mL. A negative result does not preclude SARS-CoV-2 infection and should not be used as the sole basis for treatment or other patient management decisions.  A negative result may occur with improper specimen collection / handling, submission of specimen other than nasopharyngeal swab, presence of viral mutation(s) within the areas targeted by this assay, and inadequate number of viral copies (<250 copies / mL). A negative result must be combined with clinical observations, patient history, and epidemiological information.  Fact Sheet for Patients:   RoadLapTop.co.za  Fact Sheet for Healthcare Providers: http://kim-miller.com/  This test is not yet approved or  cleared by the [place]  FDA and has  been authorized for detection and/or diagnosis of SARS-CoV-2 by FDA under an Emergency Use Authorization (EUA).  This EUA will remain in effect (meaning this test can be used) for the duration of the COVID-19 declaration under Section 564(b)(1) of the Act, 21 U.S.C. section 360bbb-3(b)(1), unless the authorization is terminated or revoked sooner.  Performed at [facility], [address] [place], [place] [address]  Urine Culture (for pregnant, neutropenic or urologic patients or patients with an indwelling urinary catheter)     Status: Abnormal   Collection Time: 04/08/24  6:22 AM   Specimen: Urine, Clean Catch  Result Value Ref Range Status   Specimen Description URINE, CLEAN CATCH  Final   Special Requests   Final    NONE Performed at [facility], [address], [place], [place] [address]    Culture (A)  Final    50,000 COLONIES/mL ESCHERICHIA COLI Confirmed Extended Spectrum Beta-Lactamase Producer (ESBL).  In bloodstream infections from ESBL organisms, carbapenems are preferred over piperacillin/tazobactam. They are shown to have a lower risk of mortality.    Report Status 04/10/2024 FINAL  Final   Organism ID, Bacteria ESCHERICHIA COLI (A)  Final      Susceptibility   Escherichia coli - MIC*    AMPICILLIN  >=32 RESISTANT Resistant     CEFAZOLIN >=64 RESISTANT Resistant     CEFEPIME 16 RESISTANT Resistant     CEFTRIAXONE  >=64 RESISTANT Resistant     CIPROFLOXACIN <=0.25 SENSITIVE Sensitive     GENTAMICIN >=16 RESISTANT Resistant     IMIPENEM <=0.25 SENSITIVE Sensitive     NITROFURANTOIN  <=16 SENSITIVE Sensitive     TRIMETH/SULFA >=320 RESISTANT Resistant     AMPICILLIN /SULBACTAM >=32 RESISTANT Resistant     PIP/TAZO <=4 SENSITIVE Sensitive ug/mL    * 50,000 COLONIES/mL ESCHERICHIA COLI  Respiratory (~20 pathogens) panel by PCR     Status: None   Collection Time: 04/08/24  7:00 AM   Specimen: Nasopharyngeal Swab; Respiratory  Result Value Ref Range  Status   Adenovirus NOT DETECTED NOT DETECTED Final   Coronavirus 229E NOT DETECTED NOT DETECTED Final    Comment: (NOTE) The Coronavirus on the Respiratory Panel, DOES NOT test for the novel  Coronavirus (2019 nCoV)    Coronavirus HKU1 NOT DETECTED NOT DETECTED Final   Coronavirus NL63 NOT DETECTED NOT DETECTED Final   Coronavirus OC43 NOT DETECTED NOT DETECTED Final   Metapneumovirus NOT DETECTED NOT DETECTED Final   Rhinovirus / Enterovirus NOT DETECTED NOT DETECTED Final   Influenza A NOT DETECTED NOT DETECTED Final   Influenza B NOT DETECTED NOT DETECTED Final   Parainfluenza Virus 1 NOT DETECTED NOT DETECTED Final   Parainfluenza Virus 2 NOT DETECTED NOT DETECTED Final   Parainfluenza Virus 3 NOT DETECTED NOT DETECTED Final   Parainfluenza Virus 4 NOT DETECTED NOT DETECTED Final   Respiratory Syncytial Virus NOT DETECTED NOT DETECTED Final   Bordetella pertussis NOT DETECTED NOT DETECTED Final   Bordetella Parapertussis NOT DETECTED NOT DETECTED Final   Chlamydophila pneumoniae NOT DETECTED NOT DETECTED Final   Mycoplasma pneumoniae NOT DETECTED NOT DETECTED Final    Comment: Performed at [facility], [address], [place], [place] [address]  Culture, blood (routine x 2) Call MD if unable to obtain prior to antibiotics being given     Status: None (Preliminary result)   Collection Time: 04/08/24 11:31 AM   Specimen: BLOOD  Result Value Ref Range Status   Specimen Description BLOOD SITE NOT SPECIFIED  Final   Special Requests   Final    BOTTLES DRAWN AEROBIC AND ANAEROBIC Blood Culture adequate volume   Culture   Final    NO GROWTH 4 DAYS Performed at [facility], [address] [place], [place] [address]    Report Status PENDING  Incomplete  Culture, blood (routine x 2) Call MD if unable to obtain prior  to antibiotics being given     Status: None (Preliminary result)   Collection Time: 04/08/24 11:53 AM   Specimen: BLOOD  Result Value Ref Range Status    Specimen Description BLOOD SITE NOT SPECIFIED  Final   Special Requests   Final    BOTTLES DRAWN AEROBIC AND ANAEROBIC Blood Culture adequate volume   Culture   Final    NO GROWTH 4 DAYS Performed at [facility], [address], [place], [place] [address]    Report Status PENDING  Incomplete  MRSA Next Gen by PCR, Nasal     Status: None   Collection Time: 04/08/24 11:45 PM   Specimen: Nasal Mucosa; Nasal Swab  Result Value Ref Range Status   MRSA by PCR Next Gen NOT DETECTED NOT DETECTED Final    Comment: (NOTE) The GeneXpert MRSA Assay (FDA approved for NASAL specimens only), is one component of a comprehensive MRSA colonization surveillance program. It is not intended to diagnose MRSA infection nor to guide or monitor treatment for MRSA infections. Test performance is not FDA approved in patients less than 81 years old. Performed at [facility], [address] [place], [place] [address]          Radiology Studies: DG CHEST PORT 1 VIEW Result Date: 04/11/2024 CLINICAL DATA:  Dyspnea. EXAM: PORTABLE CHEST 1 VIEW COMPARISON:  04/07/2024 FINDINGS: Lungs are hypoinflated demonstrate hazy prominence of the central pulmonary vessels compatible with mild interstitial edema. No significant effusion. Stable cardiomegaly. Remainder of the exam is unchanged. IMPRESSION: Cardiomegaly with findings suggesting mild interstitial edema. Electronically Signed   By: Toribio Agreste M.D.   On: 04/11/2024 09:52         LOS: 4 days   Time spent= 41 mins    Deliliah Room, MD [facility]  If 7PM-7AM, please contact night-coverage  04/12/2024, 9:54 AM

## 2024-04-12 NOTE — Plan of Care (Signed)
  Problem: Clinical Measurements: Goal: Ability to maintain clinical measurements within normal limits will improve Outcome: Progressing   Problem: Nutrition: Goal: Adequate nutrition will be maintained Outcome: Completed/Met

## 2024-04-12 NOTE — Progress Notes (Signed)
 Mobility Specialist Progress Note:   04/12/24 1105  Mobility  Activity Ambulated with assistance  Level of Assistance Contact guard assist, steadying assist  Assistive Device None;Other (Comment) (HHA)  Distance Ambulated (ft) 400 ft  Activity Response Tolerated well  Mobility Referral Yes  Mobility visit 1 Mobility  Mobility Specialist Start Time (ACUTE ONLY) 1105  Mobility Specialist Stop Time (ACUTE ONLY) 1120  Mobility Specialist Time Calculation (min) (ACUTE ONLY) 15 min    Pt agreeable to mobility session. Ambulated with and without HHA / assistive device. No overt unsteadiness noted without. Pt with no c/o. Back in bed with all needs met.   Therisa Rana Mobility Specialist Please contact via SecureChat or  Rehab office at [phone]

## 2024-04-12 NOTE — TOC Progression Note (Signed)
 Transition of Care [facility]) - Progression Note    Patient Details  Name: Theresa Rios MRN: [id number] Date of Birth: 11/19/1965  [facility] CM/SW Contact  Graves-Bigelow, Erminio Deems, RN Phone Number: 04/12/2024, 2:36 PM  Clinical Narrative:  Patient was discussed in progression rounds today. PT did consult and the patient will not need any HH PT at this time. CSW to provide community resources to the patient and bus passes for HF follow up appointments. No further needs identified by Case Manager     Expected Discharge Plan: Home/Self Care Barriers to Discharge: Continued Medical Work up  Expected Discharge Plan and Services In-house Referral: Clinical Social Work Discharge Planning Services: CM Consult Post Acute Care Choice: NA Living arrangements for the past 2 months: Single Family Home    Social Drivers of Health (SDOH) Interventions SDOH Screenings   Food Insecurity: Food Insecurity Present (04/08/2024)  Housing: Unknown (04/12/2024)  Transportation Needs: Unmet Transportation Needs (04/12/2024)  Utilities: At Risk (04/08/2024)  Alcohol Screen: High Risk (01/08/2024)  Depression (PHQ2-9): Low Risk  (01/15/2024)  Recent Concern: Depression (PHQ2-9) - High Risk (01/12/2024)  Financial Resource Strain: Low Risk  (04/12/2024)  Tobacco Use: High Risk (04/08/2024)    Readmission Risk Interventions     No data to display

## 2024-04-12 NOTE — Consult Note (Signed)
 [facility]    Date of Admission:  04/07/2024     Total days of antibiotics 4   Doxycycline  7/31 >> 8/02  Ceftriaxone  7/31 >> 8/01  Levaquin  8/02 >> 8/04            Reason for Consult: Leukocytosis, Fever    Referring Provider: Dino Primary Care Provider: Scarlett Ronal Caldron, NP    Assessment: Theresa Rios is a 58 y.o. female admitted from Rehab Facility with slurred speech   Fever, Leukocytosis -  Unclear as to the cause and she is quite well appearing without focal complaints.  Recommend chest / abdomen pelvis CT scan (non-contrasted w/ AKI that has not really improved much) to investigate.  Order panorex to rule out any dental abscesses as she has pretty poor dentition - no complaints.  Initially thought to have been provoked by seizure, however I think it was more remote than what she initially told us  about when we looked at the chart timeline - this seems to have been back in June where admitted to [facility] - valproate levels WNL on 7/22 noted. She feels clear and can communicate well with normal thought process.  Echo shows no vegetation technically poor study. She is not bacteremic.  Does not seem to be PNA process - serial CXR seem more c/w edema process. RVP negative. COVID/Flu negative.   Bacteriuria -  Low colony count of ESBL E coli. She reports no concerns for symptoms - stop abx for now and follow.   Substance Abuse -  currently in a program with [facility] and has the bed on hold for her until Friday.   New onset CHF -  BNP 5136 on admission. Responded well to lasix . Clinically does not seem to fit with pneumonia - no purulent sputum or chest pain. No fevers / sweats prior to admission.   AKI -  Baseline creatinine 0.5 - 0.7, acutely elevated to > 2. Defer contrasted study   Anemia -  Iron Deficient -  Acutely different vs May of 2025 - 14.4 >> 7.7 as well as new thrombocytopenia - suspect the latter is reactive as this has  rebounded back to normal range.  Iron stores low and being supplemented.  No bleeding  Contact Precautions -  Recommended for ESBL producing organisms colonizing urine    Plan: Contact precautions throughout hospital stay for ESBL colonized urine  Panorex ordered  Non-contrasted chest/abd/pelvis CT ordered.  Stop antibiotics and follow  Follow temp curve, pending studies and wbc trend   Principal Problem:   CAP (community acquired pneumonia) Active Problems:   Acute hypoxemic respiratory failure (HCC)   Acute anemia   Sepsis (HCC)   AKI (acute kidney injury) (HCC)   Acute encephalopathy   Thrombocytopenia (HCC)    cholecalciferol   400 Units Oral Daily   divalproex   500 mg Oral BID   ferrous sulfate   325 mg Oral Daily   folic acid   1 mg Oral Daily   levofloxacin   750 mg Oral Q48H   thiamine   100 mg Oral Daily    HPI: Theresa Rios is a 58 y.o. female here for slurred speech / AMS in the setting of hypoxemia and BNP > 5000.   She was at [facility] working on alcohol rehab. Recalls being sent to the hospital for slurred speech. Recalls she had trouble with words at one point and felt fuzzy/slow mentally.   No urinary symptoms to  what she reports or recalls. She says she has a small hernia in the left groin that does not always bother her.   She has no physical pain - she has a right wrist brace on for carpal tunnel syndrome. She has been getting her depakote  at [facility] - she felt as if something was not right while she was over there.  RVP is negative   She has no rashes. No blood stream infection. No coughing or chest pains. She said she had some seizures more recently. The last 1 year she had 11 seizures. She was only getting gabapentin  not the depakote  at the center which triggered a seizure.  No acute dental problems -    Review of Systems: Review of Systems  Constitutional:  Positive for fever. Negative for chills, diaphoresis and malaise/fatigue.  Eyes:   Positive for blurred vision.  Respiratory:  Negative for cough and sputum production.   Cardiovascular:  Negative for chest pain and leg swelling.  Gastrointestinal:  Negative for abdominal pain, diarrhea and vomiting.  Genitourinary:  Negative for dysuria, frequency and urgency.  Musculoskeletal:  Negative for back pain, joint pain and neck pain.  Skin:  Negative for rash.  Neurological:  Negative for dizziness and headaches.    Past Medical History:  Diagnosis Date   Anxiety state 03/21/2022   Cocaine use disorder, severe, dependence (HCC) 01/21/2023   Epilepsy, unspecified, intractable, without status epilepticus (HCC) 01/10/2024   Expressive aphasia 01/10/2024   Hepatic encephalopathy (HCC) 01/11/2024   Nicotine  use disorder 01/10/2024   Seizures (HCC)    Suicide attempt by drug ingestion (HCC) 03/19/2022    Social History   Tobacco Use   Smoking status: Every Day    Current packs/day: 0.50    Types: Cigarettes   Smokeless tobacco: Never  Vaping Use   Vaping status: Never Used  Substance Use Topics   Alcohol use: Not Currently    Alcohol/week: 10.0 standard drinks of alcohol    Types: 10 Cans of beer per week    Comment: Last Date of consumption 47 days ago   Drug use: Not Currently    Family History  Problem Relation Age of Onset   Seizures Neg Hx    Allergies  Allergen Reactions   Lactose Intolerance (Gi) Other (See Comments)    Gas  Bloating     OBJECTIVE: Blood pressure 123/64, pulse (!) 106, temperature 98.2 F (36.8 C), temperature source Oral, resp. rate 10, height 5' 2 (1.575 m), weight 82.3 kg, last menstrual period 05/17/2014, SpO2 98%.  Physical Exam  Lab Results Lab Results  Component Value Date   WBC 36.4 (H) 04/12/2024   HGB 7.8 (L) 04/12/2024   HCT 23.8 (L) 04/12/2024   MCV 79.9 (L) 04/12/2024   PLT 250 04/12/2024    Lab Results  Component Value Date   CREATININE 2.39 (H) 04/11/2024   BUN 44 (H) 04/11/2024   NA 137 04/11/2024    K 4.1 04/11/2024   CL 107 04/11/2024   CO2 19 (L) 04/11/2024    Lab Results  Component Value Date   ALT 11 04/10/2024   AST 14 (L) 04/10/2024   GGT 48 04/08/2024   ALKPHOS 157 (H) 04/10/2024   BILITOT 0.7 04/10/2024     Microbiology: Recent Results (from the past 240 hours)  SARS Coronavirus 2 by RT PCR (hospital order, performed in [facility] hospital lab) *cepheid single result test* Anterior Nasal Swab     Status: None   Collection Time:  04/07/24  3:49 PM   Specimen: Anterior Nasal Swab  Result Value Ref Range Status   SARS Coronavirus 2 by RT PCR NEGATIVE NEGATIVE Final    Comment: (NOTE) SARS-CoV-2 target nucleic acids are NOT DETECTED.  The SARS-CoV-2 RNA is generally detectable in upper and lower respiratory specimens during the acute phase of infection. The lowest concentration of SARS-CoV-2 viral copies this assay can detect is 250 copies / mL. A negative result does not preclude SARS-CoV-2 infection and should not be used as the sole basis for treatment or other patient management decisions.  A negative result may occur with improper specimen collection / handling, submission of specimen other than nasopharyngeal swab, presence of viral mutation(s) within the areas targeted by this assay, and inadequate number of viral copies (<250 copies / mL). A negative result must be combined with clinical observations, patient history, and epidemiological information.  Fact Sheet for Patients:   RoadLapTop.co.za  Fact Sheet for Healthcare Providers: http://kim-miller.com/  This test is not yet approved or  cleared by the [place]  FDA and has been authorized for detection and/or diagnosis of SARS-CoV-2 by FDA under an Emergency Use Authorization (EUA).  This EUA will remain in effect (meaning this test can be used) for the duration of the COVID-19 declaration under Section 564(b)(1) of the Act, 21 U.S.C. section  360bbb-3(b)(1), unless the authorization is terminated or revoked sooner.  Performed at [facility], [address] [place], [place] [address]   Urine Culture (for pregnant, neutropenic or urologic patients or patients with an indwelling urinary catheter)     Status: Abnormal   Collection Time: 04/08/24  6:22 AM   Specimen: Urine, Clean Catch  Result Value Ref Range Status   Specimen Description URINE, CLEAN CATCH  Final   Special Requests   Final    NONE Performed at [facility], [address] [place], [place] [address]    Culture (A)  Final    50,000 COLONIES/mL ESCHERICHIA COLI Confirmed Extended Spectrum Beta-Lactamase Producer (ESBL).  In bloodstream infections from ESBL organisms, carbapenems are preferred over piperacillin/tazobactam. They are shown to have a lower risk of mortality.    Report Status 04/10/2024 FINAL  Final   Organism ID, Bacteria ESCHERICHIA COLI (A)  Final      Susceptibility   Escherichia coli - MIC*    AMPICILLIN  >=32 RESISTANT Resistant     CEFAZOLIN >=64 RESISTANT Resistant     CEFEPIME 16 RESISTANT Resistant     CEFTRIAXONE  >=64 RESISTANT Resistant     CIPROFLOXACIN <=0.25 SENSITIVE Sensitive     GENTAMICIN >=16 RESISTANT Resistant     IMIPENEM <=0.25 SENSITIVE Sensitive     NITROFURANTOIN  <=16 SENSITIVE Sensitive     TRIMETH/SULFA >=320 RESISTANT Resistant     AMPICILLIN /SULBACTAM >=32 RESISTANT Resistant     PIP/TAZO <=4 SENSITIVE Sensitive ug/mL    * 50,000 COLONIES/mL ESCHERICHIA COLI  Respiratory (~20 pathogens) panel by PCR     Status: None   Collection Time: 04/08/24  7:00 AM   Specimen: Nasopharyngeal Swab; Respiratory  Result Value Ref Range Status   Adenovirus NOT DETECTED NOT DETECTED Final   Coronavirus 229E NOT DETECTED NOT DETECTED Final    Comment: (NOTE) The Coronavirus on the Respiratory Panel, DOES NOT test for the novel  Coronavirus (2019 nCoV)    Coronavirus HKU1 NOT DETECTED NOT DETECTED Final    Coronavirus NL63 NOT DETECTED NOT DETECTED Final   Coronavirus OC43 NOT DETECTED NOT DETECTED Final  Metapneumovirus NOT DETECTED NOT DETECTED Final   Rhinovirus / Enterovirus NOT DETECTED NOT DETECTED Final   Influenza A NOT DETECTED NOT DETECTED Final   Influenza B NOT DETECTED NOT DETECTED Final   Parainfluenza Virus 1 NOT DETECTED NOT DETECTED Final   Parainfluenza Virus 2 NOT DETECTED NOT DETECTED Final   Parainfluenza Virus 3 NOT DETECTED NOT DETECTED Final   Parainfluenza Virus 4 NOT DETECTED NOT DETECTED Final   Respiratory Syncytial Virus NOT DETECTED NOT DETECTED Final   Bordetella pertussis NOT DETECTED NOT DETECTED Final   Bordetella Parapertussis NOT DETECTED NOT DETECTED Final   Chlamydophila pneumoniae NOT DETECTED NOT DETECTED Final   Mycoplasma pneumoniae NOT DETECTED NOT DETECTED Final    Comment: Performed at [facility], [address] [place], [place] [address]  Culture, blood (routine x 2) Call MD if unable to obtain prior to antibiotics being given     Status: None (Preliminary result)   Collection Time: 04/08/24 11:31 AM   Specimen: BLOOD  Result Value Ref Range Status   Specimen Description BLOOD SITE NOT SPECIFIED  Final   Special Requests   Final    BOTTLES DRAWN AEROBIC AND ANAEROBIC Blood Culture adequate volume   Culture   Final    NO GROWTH 4 DAYS Performed at [facility], [address] [place], [place] [address]    Report Status PENDING  Incomplete  Culture, blood (routine x 2) Call MD if unable to obtain prior to antibiotics being given     Status: None (Preliminary result)   Collection Time: 04/08/24 11:53 AM   Specimen: BLOOD  Result Value Ref Range Status   Specimen Description BLOOD SITE NOT SPECIFIED  Final   Special Requests   Final    BOTTLES DRAWN AEROBIC AND ANAEROBIC Blood Culture adequate volume   Culture   Final    NO GROWTH 4 DAYS Performed at [facility], [address], [place], [place] [address]    Report  Status PENDING  Incomplete  MRSA Next Gen by PCR, Nasal     Status: None   Collection Time: 04/08/24 11:45 PM   Specimen: Nasal Mucosa; Nasal Swab  Result Value Ref Range Status   MRSA by PCR Next Gen NOT DETECTED NOT DETECTED Final    Comment: (NOTE) The GeneXpert MRSA Assay (FDA approved for NASAL specimens only), is one component of a comprehensive MRSA colonization surveillance program. It is not intended to diagnose MRSA infection nor to guide or monitor treatment for MRSA infections. Test performance is not FDA approved in patients less than 22 years old. Performed at [facility], [address] [place], [place] [address]     Corean Fireman, MSN, NP-C [facility] Pager: [phone]  04/12/2024 10:22 AM    Total Encounter Time: 18 min

## 2024-04-12 NOTE — TOC Progression Note (Signed)
 Transition of Care [facility]) - Progression Note    Patient Details  Name: THEONE BOWELL MRN: [id number] Date of Birth: 02-19-1966  Transition of Care [facility]) CM/SW Contact  Isaiah Public, LCSWA Phone Number: 04/12/2024, 2:36 PM  Clinical Narrative:     Stephane RN with heart failure request bus passes for patient to go to  HF [facility] appointment on 04/19/2024. CSW provided patient with bus passes and offered patient [facility]. Patient accepted. All questions answered. No further questions reported at this time.  Expected Discharge Plan: Home/Self Care Barriers to Discharge: Continued Medical Work up               Expected Discharge Plan and Services In-house Referral: Clinical Social Work Discharge Planning Services: CM Consult Post Acute Care Choice: NA Living arrangements for the past 2 months: Single Family Home                                       Social Drivers of Health (SDOH) Interventions SDOH Screenings   Food Insecurity: Food Insecurity Present (04/08/2024)  Housing: Unknown (04/12/2024)  Transportation Needs: Unmet Transportation Needs (04/12/2024)  Utilities: At Risk (04/08/2024)  Alcohol Screen: High Risk (01/08/2024)  Depression (PHQ2-9): Low Risk  (01/15/2024)  Recent Concern: Depression (PHQ2-9) - High Risk (01/12/2024)  Financial Resource Strain: Low Risk  (04/12/2024)  Tobacco Use: High Risk (04/08/2024)    Readmission Risk Interventions     No data to display

## 2024-04-12 NOTE — Progress Notes (Signed)
 Heart Failure Nurse Navigator Progress Note  PCP: Scarlett Ronal Caldron, NP PCP-Cardiologist: None Admission Diagnosis: Acute respiratory failure with hypoxia, AKI, Symptomatic anemia.  Admitted from: [facility]   Presentation:   Theresa Rios presented with slurred speech and cognitive changes from  [facility] .BP 109/59, HR 111, O2 saturations 89 %, placed on 2 L of [place]. ProBNP 5,136. Chest x-ray showing bilateral perihilar interstitial opacities, and possible pulmonary edema. CT head negative for acute intracranial abnormality. Being treate for possible pneumonia and ESBL. E.coli UTI. ID consulted.   Pateint was educated on the sign and symptoms of heart failure, daily weights, when ot call her doctor or go to the ED. Diet/ fluid restrictions reported to drinking a lot of water daily while at [employer]. Continued education on taking all medications as prescribed and attending all medical appointments. Patient stated that at discharge she was going to go to a [employer] with her friend. CSW was notified and Isaiah Public, LCSWA was going to bring the patient some bus passes for her HF [facility] appointment and community resources . Patient verbalized her understanding of all education. A HF TOC appointment was scheduled for 04/19/2024 @ 9:45 am.    ECHO/ LVEF: 60-65% New  Clinical Course:  Past Medical History:  Diagnosis Date   Anxiety state 03/21/2022   Cocaine use disorder, severe, dependence (HCC) 01/21/2023   Epilepsy, unspecified, intractable, without status epilepticus (HCC) 01/10/2024   Expressive aphasia 01/10/2024   Hepatic encephalopathy (HCC) 01/11/2024   Nicotine  use disorder 01/10/2024   Seizures (HCC)    Suicide attempt by drug ingestion (HCC) 03/19/2022     Social History   Socioeconomic History   Marital status: Media planner    Spouse name: Not on file   Number of children: 0   Years of education: Not on file   Highest education level: Not on file  Occupational  History   Not on file  Tobacco Use   Smoking status: Every Day    Current packs/day: 0.50    Types: Cigarettes   Smokeless tobacco: Never  Vaping Use   Vaping status: Never Used  Substance and Sexual Activity   Alcohol use: Not Currently    Alcohol/week: 10.0 standard drinks of alcohol    Types: 10 Cans of beer per week    Comment: Last Date of consumption 47 days ago   Drug use: Not Currently   Sexual activity: Not on file  Other Topics Concern   Not on file  Social History Narrative   [place] lives in [place] with a friend. Her husband is living with his mother. Pt recently lost her job and her place to stay.   Social Drivers of Corporate investment banker Strain: Not on file  Food Insecurity: Food Insecurity Present (04/08/2024)   Hunger Vital Sign    Worried About Running Out of Food in the Last Year: Sometimes true    Ran Out of Food in the Last Year: Never true  Transportation Needs: Unmet Transportation Needs (04/08/2024)   PRAPARE - Administrator, Civil Service (Medical): Yes    Lack of Transportation (Non-Medical): Yes  Physical Activity: Not on file  Stress: Not on file  Social Connections: Not on file   Education Assessment and Provision:  Detailed education and instructions provided on heart failure disease management including the following:  Signs and symptoms of Heart Failure When to call the physician Importance of daily weights Low sodium diet Fluid  restriction Medication management Anticipated future follow-up appointments  Patient education given on each of the above topics.  Patient acknowledges understanding via teach back method and acceptance of all instructions.  Education Materials:  Living Better With Heart Failure Booklet, HF zone tool, & Daily Weight Tracker Tool.  Patient has scale at home: yes Patient has pill box at home: Na    High Risk Criteria for Readmission and/or Poor Patient Outcomes: Heart failure hospital  admissions (last 6 months): 0  No Show rate: 17%  Difficult social situation: Yes, was in [facility] the last 3 months, plans to go to Extended Stay at Discharge ( CSW gave resources)  Demonstrates medication adherence: yes while at [facility].  Primary Language: English  Literacy level: Reading, writing, comprehension ( slower to respond)   Barriers of Care:   Diet/ fluid restrictions/ daily weights At times medication compliance New HF   Considerations/Referrals:   Referral made to Heart Failure Pharmacist Stewardship: No Referral made to Heart Failure CSW/NCM TOC: Yes, Unit LCSWA, Isaiah Public was notified of patients needs for bus passes for HF [facility] appointment and need for housing resources)  Referral made to Heart & Vascular TOC clinic: Yes, 04/19/2024 @ 9:45 am   Items for Follow-up on DC/TOC: Continued New HF education Diet/ fluid restrictions/ daily weights   Stephane Haddock, BSN, RN Heart Failure Teacher, adult education Only

## 2024-04-13 DIAGNOSIS — K047 Periapical abscess without sinus: Secondary | ICD-10-CM

## 2024-04-13 DIAGNOSIS — R569 Unspecified convulsions: Secondary | ICD-10-CM

## 2024-04-13 DIAGNOSIS — D72825 Bandemia: Secondary | ICD-10-CM

## 2024-04-13 DIAGNOSIS — K089 Disorder of teeth and supporting structures, unspecified: Secondary | ICD-10-CM

## 2024-04-13 LAB — BASIC METABOLIC PANEL WITH GFR
Anion gap: 9 (ref 5–15)
BUN: 33 mg/dL — ABNORMAL HIGH (ref 6–20)
CO2: 21 mmol/L — ABNORMAL LOW (ref 22–32)
Calcium: 8.1 mg/dL — ABNORMAL LOW (ref 8.9–10.3)
Chloride: 109 mmol/L (ref 98–111)
Creatinine, Ser: 2.34 mg/dL — ABNORMAL HIGH (ref 0.44–1.00)
GFR, Estimated: 24 mL/min — ABNORMAL LOW (ref >60)
Glucose, Bld: 111 mg/dL — ABNORMAL HIGH (ref 70–99)
Potassium: 4.9 mmol/L (ref 3.5–5.1)
Sodium: 139 mmol/L (ref 135–145)

## 2024-04-13 LAB — CBC WITH DIFFERENTIAL/PLATELET
Basophils Absolute: 0 K/uL (ref 0.0–0.1)
Basophils Relative: 0 %
Eosinophils Absolute: 0.7 K/uL — ABNORMAL HIGH (ref 0.0–0.5)
Eosinophils Relative: 2 %
HCT: 23.4 % — ABNORMAL LOW (ref 36.0–46.0)
Hemoglobin: 7.6 g/dL — ABNORMAL LOW (ref 12.0–15.0)
Lymphocytes Relative: 10 %
Lymphs Abs: 3.6 K/uL (ref 0.7–4.0)
MCH: 26.1 pg (ref 26.0–34.0)
MCHC: 32.5 g/dL (ref 30.0–36.0)
MCV: 80.4 fL (ref 80.0–100.0)
Monocytes Absolute: 1.5 K/uL — ABNORMAL HIGH (ref 0.1–1.0)
Monocytes Relative: 4 %
Neutro Abs: 30.5 K/uL — ABNORMAL HIGH (ref 1.7–7.7)
Neutrophils Relative %: 84 %
Platelets: 312 K/uL (ref 150–400)
RBC: 2.91 MIL/uL — ABNORMAL LOW (ref 3.87–5.11)
RDW: 15.4 % (ref 11.5–15.5)
WBC: 36.3 K/uL — ABNORMAL HIGH (ref 4.0–10.5)
nRBC: 0 % (ref 0.0–0.2)

## 2024-04-13 LAB — CULTURE, BLOOD (ROUTINE X 2)
Culture: NO GROWTH
Culture: NO GROWTH
Special Requests: ADEQUATE
Special Requests: ADEQUATE

## 2024-04-13 MED ORDER — AMOXICILLIN-POT CLAVULANATE 500-125 MG PO TABS
1.0000 | ORAL_TABLET | Freq: Two times a day (BID) | ORAL | Status: DC
  Administered 2024-04-13 – 2024-04-16 (×7): 1 via ORAL
  Filled 2024-04-13 (×7): qty 1

## 2024-04-13 NOTE — Progress Notes (Signed)
 Mobility Specialist: Progress Note   04/13/24 1500  Mobility  Activity Ambulated with assistance  Level of Assistance Contact guard assist, steadying assist  Assistive Device Other (Comment) (HHA)  Distance Ambulated (ft) 500 ft  Activity Response Tolerated well  Mobility Referral Yes  Mobility visit 1 Mobility  Mobility Specialist Start Time (ACUTE ONLY) 1028  Mobility Specialist Stop Time (ACUTE ONLY) 1046  Mobility Specialist Time Calculation (min) (ACUTE ONLY) 18 min    Pt received in bed, agreeable to mobility session. No complaints. Ambulated down the hallway and back without fault. Returned to room and agreed to sit in the chair for awhile. Left in chair with all needs met, call bell in reach.   Theresa Rios Mobility Specialist Please contact via SecureChat or Rehab office at [phone]

## 2024-04-13 NOTE — Care Management Important Message (Signed)
 Important Message  Patient Details  Name: Theresa Rios MRN: [id number] Date of Birth: 1966-04-12   Important Message Given:  Yes - Medicare IM     Vonzell Arrie Sharps 04/13/2024, 8:59 AM

## 2024-04-13 NOTE — Plan of Care (Signed)
  Problem: Education: Goal: Knowledge of General Education information will improve Description: Including pain rating scale, medication(s)/side effects and non-pharmacologic comfort measures Outcome: Completed/Met   Problem: Health Behavior/Discharge Planning: Goal: Ability to manage health-related needs will improve Outcome: Completed/Met   Problem: Clinical Measurements: Goal: Ability to maintain clinical measurements within normal limits will improve Outcome: Progressing Goal: Will remain free from infection Outcome: Progressing Goal: Diagnostic test results will improve Outcome: Progressing Goal: Respiratory complications will improve Outcome: Progressing Goal: Cardiovascular complication will be avoided Outcome: Progressing   Problem: Activity: Goal: Risk for activity intolerance will decrease Outcome: Progressing   Problem: Coping: Goal: Level of anxiety will decrease Outcome: Progressing   Problem: Elimination: Goal: Will not experience complications related to bowel motility Outcome: Completed/Met Goal: Will not experience complications related to urinary retention Outcome: Completed/Met   Problem: Pain Managment: Goal: General experience of comfort will improve and/or be controlled Outcome: Completed/Met   Problem: Safety: Goal: Ability to remain free from injury will improve Outcome: Progressing   Problem: Skin Integrity: Goal: Risk for impaired skin integrity will decrease Outcome: Completed/Met   Problem: Education: Goal: Ability to demonstrate management of disease process will improve Outcome: Progressing Goal: Ability to verbalize understanding of medication therapies will improve Outcome: Progressing Goal: Individualized Educational Video(s) Outcome: Progressing   Problem: Activity: Goal: Capacity to carry out activities will improve Outcome: Progressing   Problem: Cardiac: Goal: Ability to achieve and maintain adequate cardiopulmonary  perfusion will improve Outcome: Progressing   Problem: Activity: Goal: Ability to tolerate increased activity will improve Outcome: Progressing   Problem: Respiratory: Goal: Ability to maintain adequate ventilation will improve Outcome: Progressing Goal: Ability to maintain a clear airway will improve Outcome: Progressing

## 2024-04-13 NOTE — Progress Notes (Signed)
 Subjective: No new complaints   Antibiotics:  Anti-infectives (From admission, onward)    Start     Dose/Rate Route Frequency Ordered Stop   04/13/24 1200  amoxicillin -clavulanate (AUGMENTIN ) 500-125 MG per tablet 1 tablet        1 tablet Oral 2 times daily 04/13/24 1101     04/10/24 1100  levofloxacin  (LEVAQUIN ) tablet 750 mg  Status:  Discontinued        750 mg Oral Every 48 hours 04/10/24 1007 04/12/24 1026   04/09/24 0600  cefTRIAXone  (ROCEPHIN ) 1 g in sodium chloride  0.9 % 100 mL IVPB  Status:  Discontinued        1 g 200 mL/hr over 30 Minutes Intravenous Every 24 hours 04/08/24 0621 04/10/24 1008   04/08/24 2000  doxycycline  (VIBRA -TABS) tablet 100 mg  Status:  Discontinued        100 mg Oral Every 12 hours 04/08/24 1405 04/08/24 1408   04/08/24 2000  doxycycline  (VIBRA -TABS) tablet 100 mg  Status:  Discontinued        100 mg Oral Every 12 hours 04/08/24 1408 04/10/24 1050   04/08/24 0800  doxycycline  (VIBRAMYCIN ) 100 mg in sodium chloride  0.9 % 250 mL IVPB  Status:  Discontinued        100 mg 125 mL/hr over 120 Minutes Intravenous Every 12 hours 04/08/24 0621 04/08/24 1409   04/08/24 0715  cefTRIAXone  (ROCEPHIN ) 1 g in sodium chloride  0.9 % 100 mL IVPB        1 g 200 mL/hr over 30 Minutes Intravenous  Once 04/08/24 0622 04/08/24 1448       Medications: Scheduled Meds:  amoxicillin -clavulanate  1 tablet Oral BID   cholecalciferol   400 Units Oral Daily   divalproex   500 mg Oral BID   ferrous sulfate   325 mg Oral Daily   folic acid   1 mg Oral Daily   thiamine   100 mg Oral Daily   Continuous Infusions: PRN Meds:.acetaminophen  **OR** acetaminophen     Objective: Weight change: 2.772 kg  Intake/Output Summary (Last 24 hours) at 04/13/2024 1653 Last data filed at 04/13/2024 1601 Gross per 24 hour  Intake 1020 ml  Output 1150 ml  Net -130 ml   Blood pressure 114/69, pulse 100, temperature 99 F (37.2 C), temperature source Oral, resp. rate 20, height 5'  2 (1.575 m), weight 85.1 kg, last menstrual period 05/17/2014, SpO2 98%. Temp:  [98.9 F (37.2 C)-99.8 F (37.7 C)] 99 F (37.2 C) (08/05 1353) Pulse Rate:  [100-107] 100 (08/05 1353) Resp:  [17-24] 20 (08/05 1353) BP: (114-122)/(58-69) 114/69 (08/05 1353) SpO2:  [98 %-100 %] 98 % (08/05 1353) Weight:  [85.1 kg] 85.1 kg (08/05 0424)  Physical Exam: Physical Exam Constitutional:      General: She is not in acute distress.    Appearance: She is well-developed. She is not diaphoretic.  HENT:     Head: Normocephalic and atraumatic.     Right Ear: External ear normal.     Left Ear: External ear normal.     Mouth/Throat:     Dentition: Abnormal dentition.     Pharynx: No oropharyngeal exudate.  Eyes:     General: No scleral icterus.    Conjunctiva/sclera: Conjunctivae normal.     Pupils: Pupils are equal, round, and reactive to light.  Cardiovascular:     Rate and Rhythm: Normal rate and regular rhythm.     Heart sounds:     No gallop.  Pulmonary:  Effort: Pulmonary effort is normal. No respiratory distress.     Breath sounds: No wheezing.  Abdominal:     General: There is no distension.     Palpations: Abdomen is soft.  Musculoskeletal:        General: No tenderness. Normal range of motion.  Lymphadenopathy:     Cervical: No cervical adenopathy.  Skin:    General: Skin is warm and dry.     Coloration: Skin is not pale.     Findings: No erythema or rash.  Neurological:     General: No focal deficit present.     Mental Status: She is alert and oriented to person, place, and time.     Motor: No abnormal muscle tone.     Coordination: Coordination normal.  Psychiatric:        Mood and Affect: Mood normal.        Behavior: Behavior normal.        Thought Content: Thought content normal.        Judgment: Judgment normal.      CBC:    BMET Recent Labs    04/12/24 1005 04/13/24 0556  NA 140 139  K 4.8 4.9  CL 108 109  CO2 21* 21*  GLUCOSE 203* 111*  BUN  37* 33*  CREATININE 2.47* 2.34*  CALCIUM  8.1* 8.1*     Liver Panel  No results for input(s): PROT, ALBUMIN, AST, ALT, ALKPHOS, BILITOT, BILIDIR, IBILI in the last 72 hours.     Sedimentation Rate No results for input(s): ESRSEDRATE in the last 72 hours. C-Reactive Protein No results for input(s): CRP in the last 72 hours.  Micro Results: Recent Results (from the past 720 hours)  SARS Coronavirus 2 by RT PCR (hospital order, performed in [facility] hospital lab) *cepheid single result test* Anterior Nasal Swab     Status: None   Collection Time: 04/07/24  3:49 PM   Specimen: Anterior Nasal Swab  Result Value Ref Range Status   SARS Coronavirus 2 by RT PCR NEGATIVE NEGATIVE Final    Comment: (NOTE) SARS-CoV-2 target nucleic acids are NOT DETECTED.  The SARS-CoV-2 RNA is generally detectable in upper and lower respiratory specimens during the acute phase of infection. The lowest concentration of SARS-CoV-2 viral copies this assay can detect is 250 copies / mL. A negative result does not preclude SARS-CoV-2 infection and should not be used as the sole basis for treatment or other patient management decisions.  A negative result may occur with improper specimen collection / handling, submission of specimen other than nasopharyngeal swab, presence of viral mutation(s) within the areas targeted by this assay, and inadequate number of viral copies (<250 copies / mL). A negative result must be combined with clinical observations, patient history, and epidemiological information.  Fact Sheet for Patients:   RoadLapTop.co.za  Fact Sheet for Healthcare Providers: http://kim-miller.com/  This test is not yet approved or  cleared by the [place]  FDA and has been authorized for detection and/or diagnosis of SARS-CoV-2 by FDA under an Emergency Use Authorization (EUA).  This EUA will remain in effect (meaning this  test can be used) for the duration of the COVID-19 declaration under Section 564(b)(1) of the Act, 21 U.S.C. section 360bbb-3(b)(1), unless the authorization is terminated or revoked sooner.  Performed at [facility], [address] [place], [place] [address]   Urine Culture (for pregnant, neutropenic or urologic patients or patients with an indwelling urinary catheter)  Status: Abnormal   Collection Time: 04/08/24  6:22 AM   Specimen: Urine, Clean Catch  Result Value Ref Range Status   Specimen Description URINE, CLEAN CATCH  Final   Special Requests   Final    NONE Performed at [facility], [address] [place], [place] [address]    Culture (A)  Final    50,000 COLONIES/mL ESCHERICHIA COLI Confirmed Extended Spectrum Beta-Lactamase Producer (ESBL).  In bloodstream infections from ESBL organisms, carbapenems are preferred over piperacillin/tazobactam. They are shown to have a lower risk of mortality.    Report Status 04/10/2024 FINAL  Final   Organism ID, Bacteria ESCHERICHIA COLI (A)  Final      Susceptibility   Escherichia coli - MIC*    AMPICILLIN  >=32 RESISTANT Resistant     CEFAZOLIN >=64 RESISTANT Resistant     CEFEPIME 16 RESISTANT Resistant     CEFTRIAXONE  >=64 RESISTANT Resistant     CIPROFLOXACIN <=0.25 SENSITIVE Sensitive     GENTAMICIN >=16 RESISTANT Resistant     IMIPENEM <=0.25 SENSITIVE Sensitive     NITROFURANTOIN  <=16 SENSITIVE Sensitive     TRIMETH/SULFA >=320 RESISTANT Resistant     AMPICILLIN /SULBACTAM >=32 RESISTANT Resistant     PIP/TAZO <=4 SENSITIVE Sensitive ug/mL    * 50,000 COLONIES/mL ESCHERICHIA COLI  Respiratory (~20 pathogens) panel by PCR     Status: None   Collection Time: 04/08/24  7:00 AM   Specimen: Nasopharyngeal Swab; Respiratory  Result Value Ref Range Status   Adenovirus NOT DETECTED NOT DETECTED Final   Coronavirus 229E NOT DETECTED NOT DETECTED Final    Comment: (NOTE) The Coronavirus on the Respiratory  Panel, DOES NOT test for the novel  Coronavirus (2019 nCoV)    Coronavirus HKU1 NOT DETECTED NOT DETECTED Final   Coronavirus NL63 NOT DETECTED NOT DETECTED Final   Coronavirus OC43 NOT DETECTED NOT DETECTED Final   Metapneumovirus NOT DETECTED NOT DETECTED Final   Rhinovirus / Enterovirus NOT DETECTED NOT DETECTED Final   Influenza A NOT DETECTED NOT DETECTED Final   Influenza B NOT DETECTED NOT DETECTED Final   Parainfluenza Virus 1 NOT DETECTED NOT DETECTED Final   Parainfluenza Virus 2 NOT DETECTED NOT DETECTED Final   Parainfluenza Virus 3 NOT DETECTED NOT DETECTED Final   Parainfluenza Virus 4 NOT DETECTED NOT DETECTED Final   Respiratory Syncytial Virus NOT DETECTED NOT DETECTED Final   Bordetella pertussis NOT DETECTED NOT DETECTED Final   Bordetella Parapertussis NOT DETECTED NOT DETECTED Final   Chlamydophila pneumoniae NOT DETECTED NOT DETECTED Final   Mycoplasma pneumoniae NOT DETECTED NOT DETECTED Final    Comment: Performed at [facility], [address] [place], [place] [address]  Culture, blood (routine x 2) Call MD if unable to obtain prior to antibiotics being given     Status: None   Collection Time: 04/08/24 11:31 AM   Specimen: BLOOD  Result Value Ref Range Status   Specimen Description BLOOD SITE NOT SPECIFIED  Final   Special Requests   Final    BOTTLES DRAWN AEROBIC AND ANAEROBIC Blood Culture adequate volume   Culture   Final    NO GROWTH 5 DAYS Performed at [facility], [address], [place], [place] [address]    Report Status 04/13/2024 FINAL  Final  Culture, blood (routine x 2) Call MD if unable to obtain prior to antibiotics being given     Status: None   Collection Time: 04/08/24 11:53 AM   Specimen:  BLOOD  Result Value Ref Range Status   Specimen Description BLOOD SITE NOT SPECIFIED  Final   Special Requests   Final    BOTTLES DRAWN AEROBIC AND ANAEROBIC Blood Culture adequate volume   Culture   Final    NO GROWTH 5  DAYS Performed at [facility], [address] [place], [place] [address]    Report Status 04/13/2024 FINAL  Final  MRSA Next Gen by PCR, Nasal     Status: None   Collection Time: 04/08/24 11:45 PM   Specimen: Nasal Mucosa; Nasal Swab  Result Value Ref Range Status   MRSA by PCR Next Gen NOT DETECTED NOT DETECTED Final    Comment: (NOTE) The GeneXpert MRSA Assay (FDA approved for NASAL specimens only), is one component of a comprehensive MRSA colonization surveillance program. It is not intended to diagnose MRSA infection nor to guide or monitor treatment for MRSA infections. Test performance is not FDA approved in patients less than 30 years old. Performed at [facility], [address] [place], [place] [address]     Studies/Results: CT CHEST ABDOMEN PELVIS WO CONTRAST Result Date: 04/13/2024 CLINICAL DATA:  Fever short of breath EXAM: CT CHEST, ABDOMEN AND PELVIS WITHOUT CONTRAST TECHNIQUE: Multidetector CT imaging of the chest, abdomen and pelvis was performed following the standard protocol without IV contrast. RADIATION DOSE REDUCTION: This exam was performed according to the departmental dose-optimization program which includes automated exposure control, adjustment of the mA and/or kV according to patient size and/or use of iterative reconstruction technique. COMPARISON:  CT 05/06/2022, chest x-ray 04/11/2024 FINDINGS: CT CHEST FINDINGS Cardiovascular: Limited evaluation without intravenous contrast. Mild atherosclerosis. No aneurysm. Mild cardiomegaly. No pericardial effusion Mediastinum/Nodes: Patent trachea. No thyroid  mass. No suspicious lymph nodes. Esophagus within normal limits. Lungs/Pleura: No acute airspace disease, pleural effusion, or pneumothorax Musculoskeletal: The liver is enlarged measuring 21 cm. Diffuse decreased density consistent with steatosis. No calcified gallstone or biliary dilatation CT ABDOMEN PELVIS FINDINGS Hepatobiliary: No focal liver  abnormality is seen. No gallstones, gallbladder wall thickening, or biliary dilatation. Pancreas: Unremarkable. No pancreatic ductal dilatation or surrounding inflammatory changes. Spleen: Negative Adrenals/Urinary Tract: Thickened adrenals without dominant mass. Edematous appearing kidneys with prominent perinephric fat stranding. No hydronephrosis or obstructing stone. Slightly thick walled appearance of the bladder Stomach/Bowel: Stomach is nonenlarged. No dilated small bowel. Negative appendix. No acute bowel wall thickening Vascular/Lymphatic: Aortic atherosclerosis. No enlarged abdominal or pelvic lymph nodes. Reproductive: Uterus and bilateral adnexa are unremarkable. Other: Small volume free fluid in the pelvis. No free air. Fat containing left greater than right inguinal hernias. Soft tissue stranding and small volume fluid within the left inguinal hernia. Musculoskeletal: No acute osseous abnormality IMPRESSION: 1. No CT evidence for acute intrathoracic abnormality.  Cardiomegaly 2. Edematous appearing kidneys with prominent perinephric fat stranding. Slightly thick-walled appearance of the bladder. Imaging appearance suggest possible UTI and pyelonephritis. 3. Hepatomegaly with hepatic steatosis. 4. Fat containing left greater than right inguinal hernias. Soft tissue stranding and small volume fluid within the left inguinal hernia, cannot exclude incarcerated fat containing left inguinal hernia. 5. Aortic atherosclerosis. Aortic Atherosclerosis (ICD10-I70.0). Electronically Signed   By: Luke Bun M.D.   On: 04/13/2024 01:41   DG Orthopantogram Result Date: 04/12/2024 CLINICAL DATA:  [id number] Poor dentition requiring referral to dentistry [id number] EXAM: ORTHOPANTOGRAM/PANORAMIC COMPARISON:  None Available. FINDINGS: Patient missing multiple upper and lower teeth. Remaining teeth demonstrate numerous dental caries. Lucency around the remaining left lower premolar concerning for periapical abscess. No  mandibular fracture. Visualized paranasal sinuses clear. IMPRESSION: Numerous dental caries in remaining teeth. Concern for periapical abscess around the remaining left lower premolar. Electronically Signed   By: Franky Crease M.D.   On: 04/12/2024 12:56      Assessment/Plan:  INTERVAL HISTORY: CT chest abdomen pelvis unrevealing but part panorex shows a dental abscess   Principal Problem:   Leukocytosis Active Problems:   Acute hypoxemic respiratory failure (HCC)   Acute anemia   Sepsis (HCC)   CAP (community acquired pneumonia)   AKI (acute kidney injury) (HCC)   Acute encephalopathy   Thrombocytopenia (HCC)   ESBL E. coli carrier   Poor dentition    Theresa Rios is a 58 y.o. 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  She was being treated for urinary tract infection though he only had low colony counts of bacteria in the urine and did not appear to have any urinary specific symptoms.  On imaging she has now been found to have an abscess left pretty molar on the lower jaw.  I have started on Augmentin  renally dosed and would plan on her leaving with 2-week supply of this.  Would repeat CBC in the morning.  Would want to make sure she is connected to a dentist) she does not have a dentist at present) who can see her in the outpatient world and address this abscess.    I have personally spent 52 minutes involved in face-to-face and non-face-to-face activities for this patient on the day of the visit. Professional time spent includes the following activities: Preparing to see the patient (review of tests), Obtaining and/or reviewing separately obtained history (admission/discharge record), Performing a medically appropriate examination and/or evaluation , Ordering  medications/tests/procedures, referring and communicating with other health care professionals, Documenting clinical information in the EMR, Independently interpreting results (not separately reported), Communicating results to the patient/family/caregiver, Counseling and educating the patient/family/caregiver and Care coordination (not separately reported).   Evaluation of the patient requires complex antimicrobial therapy evaluation, counseling , isolation needs to reduce disease transmission and risk assessment and mitigation.   I will keep her on the rounding list to follow-up on her CBC with differential but provide is trending the right direction she otherwise seems stable and wants dental appointment is arranged prior to discharge she can be safely discharged.   LOS: 5 days   Jomarie Fleeta Rothman 04/13/2024, 4:53 PM

## 2024-04-13 NOTE — Plan of Care (Signed)
  Problem: Health Behavior/Discharge Planning: Goal: Ability to manage health-related needs will improve Outcome: Progressing   Problem: Clinical Measurements: Goal: Ability to maintain clinical measurements within normal limits will improve Outcome: Progressing Goal: Diagnostic test results will improve Outcome: Progressing   Problem: Activity: Goal: Risk for activity intolerance will decrease Outcome: Progressing   Problem: Coping: Goal: Level of anxiety will decrease Outcome: Progressing   Problem: Pain Managment: Goal: General experience of comfort will improve and/or be controlled Outcome: Progressing   Problem: Activity: Goal: Capacity to carry out activities will improve Outcome: Progressing

## 2024-04-13 NOTE — Progress Notes (Addendum)
 PROGRESS NOTE    Theresa Rios  FMW:[id number] DOB: 10/30/1965 DOA: 04/07/2024 PCP: Scarlett Ronal Caldron, NP   Brief Narrative:   58 y.o. female with medical history significant of depression, anxiety, cocaine abuse, prior suicide attempt, epilepsy, expressive aphasia, hepatic encephalopathy, tobacco abuse, alcohol use disorder currently undergoing treatment at [facility] rehab.  She was sent to the ED yesterday from rehab for evaluation of slurred speech and confusion. Being treated for possible pneumonia and ESBL. E.coli UTI. ID consulted for worsening leukocytosis in the absence of fever. CT Abdomen showed pyelonephritis and Odontogram showed periapical abscess.  Assessment & Plan:  Principal Problem:   Leukocytosis Active Problems:   Acute hypoxemic respiratory failure (HCC)   Acute anemia   Sepsis (HCC)   CAP (community acquired pneumonia)   AKI (acute kidney injury) (HCC)   Acute encephalopathy   Thrombocytopenia (HCC)   ESBL E. coli carrier   Poor dentition   Sepsis secondary to possible bacterial CAP/UTI: upward trending WBC in the absence of fever Chest x-ray showing bilateral perihilar interstitial opacities suspicious for possible pneumonia.   SARS-CoV-2 PCR negative.  Blood cultures ordered-NGTD  Dced Rocephin  on 8/2. Dced doxycycline . Dced levofloxaxin on 8/4. Started on augmentin  on 8/5. MRSA PCR screen negative, strep pneumo (negative), Legionella urinary antigens, and procalcitonin level (5.8).  Urine culture grew 50000/ml colonies of ESBL. E.coli but asymptomatic. ID Dr Fleeta Rothman on board [facility] showed evidence of periapical abscess of left lower premolar teeth. CT chest/abdomen/pelvis done on 8/4 showed evidence of pyelonephritis.   ESBL. E.coli, asymptomatic: Started on oral levofloxacin  750 mg Q48 hrs on 8/2, dced on 8/4 as per ID   Likely New onset acute diastolic CHF,POA:   No documented history of CHF or previous echo results in the chart.   proBNP 5136 and  chest x-ray with possible pulmonary edema.   Echocardiogram showed normal EF and no significant valvular abnormalities.   Monitor intake and output, daily weights, renal function. Fluid restriction of 1.5 L per day   Acute hypoxemic respiratory failure,POA:  Likely multifactorial from pneumonia and acute CHF. Currently, not requiring any supplemental oxygen.   VBG without evidence of hypercarbia.   AKI ?Pre-renal vs cardiorenal in the setting of acute CHF.  Creatinine 2.3 down from 2.7 (baseline 0.5-0.7).  A dose of IV Lasix  given on 7/31. Renal ultrasound is unremarkable. F/u urine sodium (less than 10) and osmolality(136) F/u renal panel in am   Acute metabolic encephalopathy: Likely in the setting of UTI, resolved CT head negative for acute intracranial abnormality and no focal neurodeficit on exam.  She is on Depakote  due to known history of seizure disorder and valproic  acid level within normal range.    UDS negative.   UA with negative nitrite, small amount of leukocytes, and microscopy with >50 WBCs and few bacteria. Continue antibiotics   Microcytic anemia/iron deficiency anemia: Hemoglobin 8.5 (previously 14.4 on labs 3 months ago). No overt bleeding and FOBT negative.  Monitor H&H and transfuse PRBCs if hemoglobin is less than 7. Started on ferrous sulfate  supplementation.   Mild thrombocytopenia No signs of bleeding, monitor platelet count.   Hyponatremia Sodium 127, previously normal 3 months ago.  A dose of IV Lasix  given for diuresis.   F/u repeat sodium level    Elevated alkaline phosphatase Transaminases and T. bili normal.  F/u GGT.   Seizure disorder Continue Depakote    Alcohol use disorder No ongoing alcohol use as patient is currently undergoing treatment at [facility] rehab and  per recent PCP note, she has been there for for the past 3 months.  She is tachycardic but no other signs of withdrawal at this time. Continue with thiamine  and folate  supplementation.   Possible Acute left maxillary sinusitis CT showing frothy secretions in the left maxillary sinus.  Does have significant leukocytosis on labs, however, patient denies any sinus pain/pressure or purulent nasal discharge.   Major moderate Depression/anxiety No acute issues  Disposition: Home. She came from [facility] rehab but wants to go home on dishcarge. She would be living with a friend.   DVT prophylaxis: SCDs Start: 04/08/24 0616. Patient is ambulatory.     Code Status: Full Code Family Communication:  None at the bedside Status is: Inpatient Remains inpatient appropriate because:Sepsis, leukocytosis    Subjective:  No acute events overnight.On contact precautions for ESBL E.coli. Denies chest pain or shortness of breath. She asked about her discharge planning.  Examination:  General exam: AAO X 3 Respiratory system: Clear to auscultation. Respiratory effort normal. Cardiovascular system: S1 & S2 heard, RRR. No JVD, murmurs, rubs, gallops or clicks. Trace pedal edema. Gastrointestinal system: Abdomen is nondistended, soft and nontender. No organomegaly or masses felt. Normal bowel sounds heard. Central nervous system: Alert and oriented. No focal neurological deficits. Extremities: Symmetric 5 x 5 power. Skin: No rashes, lesions or ulcers       Diet Orders (From admission, onward)     Start     Ordered   04/08/24 0616  Diet Heart Room service appropriate? Yes; Fluid consistency: Thin  Diet effective now       Question Answer Comment  Room service appropriate? Yes   Fluid consistency: Thin      04/08/24 0621            Objective: Vitals:   04/12/24 2000 04/12/24 2200 04/12/24 2350 04/13/24 0424  BP:   (!) 120/58 121/65  Pulse:   (!) 105 (!) 106  Resp: 19 (!) 24 17 20   Temp:   99.5 F (37.5 C) 99.8 F (37.7 C)  TempSrc:   Oral Oral  SpO2:   98% 100%  Weight:    85.1 kg  Height:        Intake/Output Summary (Last 24 hours) at  04/13/2024 0910 Last data filed at 04/13/2024 0800 Gross per 24 hour  Intake 1240 ml  Output 2250 ml  Net -1010 ml   Filed Weights   04/11/24 0500 04/12/24 0500 04/13/24 0424  Weight: 83.1 kg 82.3 kg 85.1 kg    Scheduled Meds:  cholecalciferol   400 Units Oral Daily   divalproex   500 mg Oral BID   ferrous sulfate   325 mg Oral Daily   folic acid   1 mg Oral Daily   thiamine   100 mg Oral Daily   Continuous Infusions:    Nutritional status     Body mass index is 34.31 kg/m.  Data Reviewed:   CBC: Recent Labs  Lab 04/09/24 0452 04/10/24 0928 04/11/24 0406 04/12/24 0508 04/13/24 0556  WBC 27.4* 29.8* 32.1* 36.4* 36.3*  NEUTROABS 23.0* 22.9* 17.3* 19.3* PENDING  HGB 8.2* 7.9* 7.7* 7.8* 7.6*  HCT 25.0* 24.3* 23.4* 23.8* 23.4*  MCV 80.1 80.5 79.9* 79.9* 80.4  PLT 125* 169 190 250 312   Basic Metabolic Panel: Recent Labs  Lab 04/09/24 0452 04/10/24 0928 04/11/24 0406 04/12/24 1005 04/13/24 0556  NA 131* 135 137 140 139  K 3.4* 3.7 4.1 4.8 4.9  CL 99 103 107 108 109  CO2  21* 19* 19* 21* 21*  GLUCOSE 117* 122* 120* 203* 111*  BUN 48* 51* 44* 37* 33*  CREATININE 2.61* 2.57* 2.39* 2.47* 2.34*  CALCIUM  8.2* 8.3* 8.1* 8.1* 8.1*  MG  --   --  2.6*  --   --    GFR: Estimated Creatinine Clearance: 26.5 mL/min (A) (by C-G formula based on SCr of 2.34 mg/dL (H)). Liver Function Tests: Recent Labs  Lab 04/07/24 1539 04/08/24 1150 04/10/24 0928  AST 16 14* 14*  ALT 12 12 11   ALKPHOS 193* 165* 157*  BILITOT 0.8 1.0 0.7  PROT 6.2* 5.7* 5.8*  ALBUMIN 3.1* 2.0* 1.9*   No results for input(s): LIPASE, AMYLASE in the last 168 hours. Recent Labs  Lab 04/07/24 1541 04/08/24 1150  AMMONIA 41* 45*   Coagulation Profile: No results for input(s): INR, PROTIME in the last 168 hours. Cardiac Enzymes: No results for input(s): CKTOTAL, CKMB, CKMBINDEX, TROPONINI in the last 168 hours. BNP (last 3 results) Recent Labs    04/07/24 1539  PROBNP 5,136.0*    HbA1C: No results for input(s): HGBA1C in the last 72 hours. CBG: No results for input(s): GLUCAP in the last 168 hours. Lipid Profile: No results for input(s): CHOL, HDL, LDLCALC, TRIG, CHOLHDL, LDLDIRECT in the last 72 hours. Thyroid  Function Tests: No results for input(s): TSH, T4TOTAL, FREET4, T3FREE, THYROIDAB in the last 72 hours. Anemia Panel: No results for input(s): VITAMINB12, FOLATE, FERRITIN, TIBC, IRON, RETICCTPCT in the last 72 hours.  Sepsis Labs: Recent Labs  Lab 04/08/24 1150  PROCALCITON 5.84  LATICACIDVEN 1.1    Recent Results (from the past 240 hours)  SARS Coronavirus 2 by RT PCR (hospital order, performed in [facility] hospital lab) *cepheid single result test* Anterior Nasal Swab     Status: None   Collection Time: 04/07/24  3:49 PM   Specimen: Anterior Nasal Swab  Result Value Ref Range Status   SARS Coronavirus 2 by RT PCR NEGATIVE NEGATIVE Final    Comment: (NOTE) SARS-CoV-2 target nucleic acids are NOT DETECTED.  The SARS-CoV-2 RNA is generally detectable in upper and lower respiratory specimens during the acute phase of infection. The lowest concentration of SARS-CoV-2 viral copies this assay can detect is 250 copies / mL. A negative result does not preclude SARS-CoV-2 infection and should not be used as the sole basis for treatment or other patient management decisions.  A negative result may occur with improper specimen collection / handling, submission of specimen other than nasopharyngeal swab, presence of viral mutation(s) within the areas targeted by this assay, and inadequate number of viral copies (<250 copies / mL). A negative result must be combined with clinical observations, patient history, and epidemiological information.  Fact Sheet for Patients:   RoadLapTop.co.za  Fact Sheet for Healthcare Providers: http://kim-miller.com/  This test is not  yet approved or  cleared by the [place]  FDA and has been authorized for detection and/or diagnosis of SARS-CoV-2 by FDA under an Emergency Use Authorization (EUA).  This EUA will remain in effect (meaning this test can be used) for the duration of the COVID-19 declaration under Section 564(b)(1) of the Act, 21 U.S.C. section 360bbb-3(b)(1), unless the authorization is terminated or revoked sooner.  Performed at [facility], [address] [place], [place] [address]   Urine Culture (for pregnant, neutropenic or urologic patients or patients with an indwelling urinary catheter)     Status: Abnormal   Collection Time: 04/08/24  6:22 AM   Specimen:  Urine, Clean Catch  Result Value Ref Range Status   Specimen Description URINE, CLEAN CATCH  Final   Special Requests   Final    NONE Performed at [facility], [address], [place], [place] [address]    Culture (A)  Final    50,000 COLONIES/mL ESCHERICHIA COLI Confirmed Extended Spectrum Beta-Lactamase Producer (ESBL).  In bloodstream infections from ESBL organisms, carbapenems are preferred over piperacillin/tazobactam. They are shown to have a lower risk of mortality.    Report Status 04/10/2024 FINAL  Final   Organism ID, Bacteria ESCHERICHIA COLI (A)  Final      Susceptibility   Escherichia coli - MIC*    AMPICILLIN  >=32 RESISTANT Resistant     CEFAZOLIN >=64 RESISTANT Resistant     CEFEPIME 16 RESISTANT Resistant     CEFTRIAXONE  >=64 RESISTANT Resistant     CIPROFLOXACIN <=0.25 SENSITIVE Sensitive     GENTAMICIN >=16 RESISTANT Resistant     IMIPENEM <=0.25 SENSITIVE Sensitive     NITROFURANTOIN  <=16 SENSITIVE Sensitive     TRIMETH/SULFA >=320 RESISTANT Resistant     AMPICILLIN /SULBACTAM >=32 RESISTANT Resistant     PIP/TAZO <=4 SENSITIVE Sensitive ug/mL    * 50,000 COLONIES/mL ESCHERICHIA COLI  Respiratory (~20 pathogens) panel by PCR     Status: None   Collection Time: 04/08/24  7:00 AM   Specimen:  Nasopharyngeal Swab; Respiratory  Result Value Ref Range Status   Adenovirus NOT DETECTED NOT DETECTED Final   Coronavirus 229E NOT DETECTED NOT DETECTED Final    Comment: (NOTE) The Coronavirus on the Respiratory Panel, DOES NOT test for the novel  Coronavirus (2019 nCoV)    Coronavirus HKU1 NOT DETECTED NOT DETECTED Final   Coronavirus NL63 NOT DETECTED NOT DETECTED Final   Coronavirus OC43 NOT DETECTED NOT DETECTED Final   Metapneumovirus NOT DETECTED NOT DETECTED Final   Rhinovirus / Enterovirus NOT DETECTED NOT DETECTED Final   Influenza A NOT DETECTED NOT DETECTED Final   Influenza B NOT DETECTED NOT DETECTED Final   Parainfluenza Virus 1 NOT DETECTED NOT DETECTED Final   Parainfluenza Virus 2 NOT DETECTED NOT DETECTED Final   Parainfluenza Virus 3 NOT DETECTED NOT DETECTED Final   Parainfluenza Virus 4 NOT DETECTED NOT DETECTED Final   Respiratory Syncytial Virus NOT DETECTED NOT DETECTED Final   Bordetella pertussis NOT DETECTED NOT DETECTED Final   Bordetella Parapertussis NOT DETECTED NOT DETECTED Final   Chlamydophila pneumoniae NOT DETECTED NOT DETECTED Final   Mycoplasma pneumoniae NOT DETECTED NOT DETECTED Final    Comment: Performed at [facility], [address] [place], [place], [place] [address]  Culture, blood (routine x 2) Call MD if unable to obtain prior to antibiotics being given     Status: None   Collection Time: 04/08/24 11:31 AM   Specimen: BLOOD  Result Value Ref Range Status   Specimen Description BLOOD SITE NOT SPECIFIED  Final   Special Requests   Final    BOTTLES DRAWN AEROBIC AND ANAEROBIC Blood Culture adequate volume   Culture   Final    NO GROWTH 5 DAYS Performed at [facility], [address], [place], [place] [address]    Report Status 04/13/2024 FINAL  Final  Culture, blood (routine x 2) Call MD if unable to obtain prior to antibiotics being given     Status: None   Collection Time: 04/08/24 11:53 AM   Specimen: BLOOD  Result Value  Ref Range Status   Specimen Description BLOOD  SITE NOT SPECIFIED  Final   Special Requests   Final    BOTTLES DRAWN AEROBIC AND ANAEROBIC Blood Culture adequate volume   Culture   Final    NO GROWTH 5 DAYS Performed at [facility], [address] [place], [place] [address]    Report Status 04/13/2024 FINAL  Final  MRSA Next Gen by PCR, Nasal     Status: None   Collection Time: 04/08/24 11:45 PM   Specimen: Nasal Mucosa; Nasal Swab  Result Value Ref Range Status   MRSA by PCR Next Gen NOT DETECTED NOT DETECTED Final    Comment: (NOTE) The GeneXpert MRSA Assay (FDA approved for NASAL specimens only), is one component of a comprehensive MRSA colonization surveillance program. It is not intended to diagnose MRSA infection nor to guide or monitor treatment for MRSA infections. Test performance is not FDA approved in patients less than 31 years old. Performed at [facility], [address], [place], [place] [address]          Radiology Studies: CT CHEST ABDOMEN PELVIS WO CONTRAST Result Date: 04/13/2024 CLINICAL DATA:  Fever short of breath EXAM: CT CHEST, ABDOMEN AND PELVIS WITHOUT CONTRAST TECHNIQUE: Multidetector CT imaging of the chest, abdomen and pelvis was performed following the standard protocol without IV contrast. RADIATION DOSE REDUCTION: This exam was performed according to the departmental dose-optimization program which includes automated exposure control, adjustment of the mA and/or kV according to patient size and/or use of iterative reconstruction technique. COMPARISON:  CT 05/06/2022, chest x-ray 04/11/2024 FINDINGS: CT CHEST FINDINGS Cardiovascular: Limited evaluation without intravenous contrast. Mild atherosclerosis. No aneurysm. Mild cardiomegaly. No pericardial effusion Mediastinum/Nodes: Patent trachea. No thyroid  mass. No suspicious lymph nodes. Esophagus within normal limits. Lungs/Pleura: No acute airspace disease, pleural effusion, or pneumothorax  Musculoskeletal: The liver is enlarged measuring 21 cm. Diffuse decreased density consistent with steatosis. No calcified gallstone or biliary dilatation CT ABDOMEN PELVIS FINDINGS Hepatobiliary: No focal liver abnormality is seen. No gallstones, gallbladder wall thickening, or biliary dilatation. Pancreas: Unremarkable. No pancreatic ductal dilatation or surrounding inflammatory changes. Spleen: Negative Adrenals/Urinary Tract: Thickened adrenals without dominant mass. Edematous appearing kidneys with prominent perinephric fat stranding. No hydronephrosis or obstructing stone. Slightly thick walled appearance of the bladder Stomach/Bowel: Stomach is nonenlarged. No dilated small bowel. Negative appendix. No acute bowel wall thickening Vascular/Lymphatic: Aortic atherosclerosis. No enlarged abdominal or pelvic lymph nodes. Reproductive: Uterus and bilateral adnexa are unremarkable. Other: Small volume free fluid in the pelvis. No free air. Fat containing left greater than right inguinal hernias. Soft tissue stranding and small volume fluid within the left inguinal hernia. Musculoskeletal: No acute osseous abnormality IMPRESSION: 1. No CT evidence for acute intrathoracic abnormality.  Cardiomegaly 2. Edematous appearing kidneys with prominent perinephric fat stranding. Slightly thick-walled appearance of the bladder. Imaging appearance suggest possible UTI and pyelonephritis. 3. Hepatomegaly with hepatic steatosis. 4. Fat containing left greater than right inguinal hernias. Soft tissue stranding and small volume fluid within the left inguinal hernia, cannot exclude incarcerated fat containing left inguinal hernia. 5. Aortic atherosclerosis. Aortic Atherosclerosis (ICD10-I70.0). Electronically Signed   By: Luke Bun M.D.   On: 04/13/2024 01:41   DG Orthopantogram Result Date: 04/12/2024 CLINICAL DATA:  [id number] Poor dentition requiring referral to dentistry [id number] EXAM: ORTHOPANTOGRAM/PANORAMIC COMPARISON:  None  Available. FINDINGS: Patient missing multiple upper and lower teeth. Remaining teeth demonstrate numerous dental caries. Lucency around the remaining left lower premolar concerning for periapical abscess. No mandibular fracture. Visualized paranasal sinuses clear.  IMPRESSION: Numerous dental caries in remaining teeth. Concern for periapical abscess around the remaining left lower premolar. Electronically Signed   By: Franky Crease M.D.   On: 04/12/2024 12:56   DG CHEST PORT 1 VIEW Result Date: 04/11/2024 CLINICAL DATA:  Dyspnea. EXAM: PORTABLE CHEST 1 VIEW COMPARISON:  04/07/2024 FINDINGS: Lungs are hypoinflated demonstrate hazy prominence of the central pulmonary vessels compatible with mild interstitial edema. No significant effusion. Stable cardiomegaly. Remainder of the exam is unchanged. IMPRESSION: Cardiomegaly with findings suggesting mild interstitial edema. Electronically Signed   By: Toribio Agreste M.D.   On: 04/11/2024 09:52         LOS: 5 days   Time spent= 42 mins    Deliliah Room, MD [facility]  If 7PM-7AM, please contact night-coverage  04/13/2024, 9:10 AM

## 2024-04-13 NOTE — Progress Notes (Signed)
 Occupational Therapy Treatment Patient Details Name: Theresa Rios MRN: [id number] DOB: 03-18-1966 Today's Date: 04/13/2024   History of present illness 58 y.o. female admitted 04/07/24 for sepsis secondary to possible bacterial CAP. Pt presented with slurred speech and confusion. Found to have new onset CHF, acute hypoxemic respiratory failure, AKI, acute metabolic encephalopathy, and  E.coli UTI. PMHx: depression, anxiety, cocaine abuse, prior suicide attempt, epilepsy, expressive aphasia, hepatic encephalopathy, tobacco abuse, alcohol use disorder currently undergoing treatment at [facility].   OT comments  Pt progressing with grooming task to sink level. Pt demonstrates balance deficits and benefits from RW during session. Pt with poor safety awareness with RW. Pt demonstrates cognitive deficits. Recommendation for HHOT.       If plan is discharge home, recommend the following:  A little help with walking and/or transfers;A little help with bathing/dressing/bathroom;Assistance with cooking/housework;Assist for transportation;Help with stairs or ramp for entrance;Direct supervision/assist for financial management;Direct supervision/assist for medications management   Equipment Recommendations  None recommended by OT    Recommendations for Other Services      Precautions / Restrictions Precautions Precautions: Fall Recall of Precautions/Restrictions: Impaired Restrictions Weight Bearing Restrictions Per Provider Order: No       Mobility Bed Mobility Overal bed mobility: Independent                  Transfers Overall transfer level: Modified independent                       Balance                                           ADL either performed or assessed with clinical judgement   ADL Overall ADL's : Needs assistance/impaired Eating/Feeding: Independent   Grooming: Supervision/safety;Standing Grooming Details (indicate cue type and  reason): oral care hand hygiene                 Toilet Transfer: Contact guard Armed forces operational officer Details (indicate cue type and reason): cues for safety due to balance initially Toileting- Clothing Manipulation and Hygiene: Contact guard assist       Functional mobility during ADLs: Supervision/safety;Rolling walker (2 wheels) General ADL Comments: pt with increased balance with RW but decreased safety awareness . pt abandons RW    Extremity/Trunk Assessment              Vision       Perception     Praxis     Communication     Cognition Arousal: Alert Behavior During Therapy: WFL for tasks assessed/performed Cognition: Cognition impaired       Memory impairment (select all impairments): Short-term memory     OT - Cognition Comments: pt completed adls automatically but with dual task requires increased time and pausing loosing place in task                 Following commands: Intact        Cueing      Exercises      Shoulder Instructions       General Comments VSS on RA. RN made aware pt was able to void bladder    Pertinent Vitals/ Pain          Home Living  Prior Functioning/Environment              Frequency  Min 2X/week        Progress Toward Goals  OT Goals(current goals can now be found in the care plan section)  Progress towards OT goals: Progressing toward goals  Acute Rehab OT Goals Patient Stated Goal: get a hot blanket OT Goal Formulation: With patient Time For Goal Achievement: 04/24/24 Potential to Achieve Goals: Good ADL Goals Pt Will Perform Grooming: with modified independence;standing Pt Will Perform Lower Body Dressing: with modified independence;sit to/from stand Pt Will Transfer to Toilet: with modified independence;ambulating Additional ADL Goal #1: pt will follow 2 step commands during ADL with mod I  Plan       Co-evaluation                 AM-PAC OT 6 Clicks Daily Activity     Outcome Measure   Help from another person eating meals?: None Help from another person taking care of personal grooming?: None Help from another person toileting, which includes using toliet, bedpan, or urinal?: A Little Help from another person bathing (including washing, rinsing, drying)?: A Little Help from another person to put on and taking off regular upper body clothing?: A Little Help from another person to put on and taking off regular lower body clothing?: A Little 6 Click Score: 20    End of Session Equipment Utilized During Treatment: Rolling walker (2 wheels)  OT Visit Diagnosis: Unsteadiness on feet (R26.81);Muscle weakness (generalized) (M62.81)   Activity Tolerance Patient tolerated treatment well   Patient Left in chair;with call bell/phone within reach (RN provided chair alarm as pt is contact and OT needed to get chair alarm and RN was gown to enter room with meds. pt was not alone in the chair)   Nurse Communication Mobility status;Precautions        Time: 1310-1330 OT Time Calculation (min): 20 min  Charges: OT General Charges $OT Visit: 1 Visit OT Treatments $Self Care/Home Management : 8-22 mins   Brynn, OTR/L  Acute Rehabilitation Services Office: [phone] .   Ely Molt 04/13/2024, 1:40 PM

## 2024-04-14 DIAGNOSIS — J9601 Acute respiratory failure with hypoxia: Secondary | ICD-10-CM | POA: Diagnosis not present

## 2024-04-14 LAB — BASIC METABOLIC PANEL WITH GFR
Anion gap: 10 (ref 5–15)
BUN: 30 mg/dL — ABNORMAL HIGH (ref 6–20)
CO2: 20 mmol/L — ABNORMAL LOW (ref 22–32)
Calcium: 8.4 mg/dL — ABNORMAL LOW (ref 8.9–10.3)
Chloride: 108 mmol/L (ref 98–111)
Creatinine, Ser: 2.34 mg/dL — ABNORMAL HIGH (ref 0.44–1.00)
GFR, Estimated: 24 mL/min — ABNORMAL LOW (ref >60)
Glucose, Bld: 153 mg/dL — ABNORMAL HIGH (ref 70–99)
Potassium: 4.7 mmol/L (ref 3.5–5.1)
Sodium: 138 mmol/L (ref 135–145)

## 2024-04-14 LAB — CBC WITH DIFFERENTIAL/PLATELET
Basophils Absolute: 0 K/uL (ref 0.0–0.1)
Basophils Relative: 0 %
Eosinophils Absolute: 0 K/uL (ref 0.0–0.5)
Eosinophils Relative: 0 %
HCT: 23.8 % — ABNORMAL LOW (ref 36.0–46.0)
Hemoglobin: 7.7 g/dL — ABNORMAL LOW (ref 12.0–15.0)
Lymphocytes Relative: 6 %
Lymphs Abs: 2.1 K/uL (ref 0.7–4.0)
MCH: 26.4 pg (ref 26.0–34.0)
MCHC: 32.4 g/dL (ref 30.0–36.0)
MCV: 81.5 fL (ref 80.0–100.0)
Monocytes Absolute: 2.1 K/uL — ABNORMAL HIGH (ref 0.1–1.0)
Monocytes Relative: 6 %
Neutro Abs: 30.7 K/uL — ABNORMAL HIGH (ref 1.7–7.7)
Neutrophils Relative %: 88 %
Platelets: 366 K/uL (ref 150–400)
RBC: 2.92 MIL/uL — ABNORMAL LOW (ref 3.87–5.11)
RDW: 15.4 % (ref 11.5–15.5)
WBC: 34.9 K/uL — ABNORMAL HIGH (ref 4.0–10.5)
nRBC: 0 % (ref 0.0–0.2)

## 2024-04-14 LAB — URINALYSIS, ROUTINE W REFLEX MICROSCOPIC
Bilirubin Urine: NEGATIVE
Glucose, UA: NEGATIVE mg/dL
Ketones, ur: NEGATIVE mg/dL
Nitrite: NEGATIVE
Protein, ur: 30 mg/dL — AB
Specific Gravity, Urine: 1.005 (ref 1.005–1.030)
WBC, UA: >50 WBC/hpf (ref 0–5)
pH: 6 (ref 5.0–8.0)

## 2024-04-14 LAB — CREATININE, URINE, RANDOM: Creatinine, Urine: 46 mg/dL

## 2024-04-14 LAB — SODIUM, URINE, RANDOM: Sodium, Ur: 22 mmol/L

## 2024-04-14 MED ORDER — FERROUS SULFATE 325 (65 FE) MG PO TABS
325.0000 mg | ORAL_TABLET | ORAL | Status: DC
  Administered 2024-04-16: 325 mg via ORAL
  Filled 2024-04-14: qty 1

## 2024-04-14 NOTE — Plan of Care (Signed)
  Problem: Clinical Measurements: Goal: Ability to maintain clinical measurements within normal limits will improve Outcome: Progressing Goal: Diagnostic test results will improve Outcome: Progressing   Problem: Activity: Goal: Risk for activity intolerance will decrease Outcome: Progressing   Problem: Coping: Goal: Level of anxiety will decrease Outcome: Progressing   Problem: Education: Goal: Ability to demonstrate management of disease process will improve Outcome: Progressing   Problem: Activity: Goal: Capacity to carry out activities will improve Outcome: Progressing   Problem: Activity: Goal: Ability to tolerate increased activity will improve Outcome: Progressing

## 2024-04-14 NOTE — Plan of Care (Signed)
  Problem: Clinical Measurements: Goal: Ability to maintain clinical measurements within normal limits will improve Outcome: Progressing Goal: Will remain free from infection Outcome: Progressing Goal: Diagnostic test results will improve Outcome: Progressing Goal: Respiratory complications will improve Outcome: Progressing Goal: Cardiovascular complication will be avoided Outcome: Progressing   Problem: Activity: Goal: Risk for activity intolerance will decrease Outcome: Progressing   Problem: Coping: Goal: Level of anxiety will decrease Outcome: Progressing   Problem: Safety: Goal: Ability to remain free from injury will improve Outcome: Progressing   Problem: Education: Goal: Ability to demonstrate management of disease process will improve Outcome: Progressing Goal: Ability to verbalize understanding of medication therapies will improve Outcome: Progressing Goal: Individualized Educational Video(s) Outcome: Progressing   Problem: Activity: Goal: Capacity to carry out activities will improve Outcome: Progressing   Problem: Cardiac: Goal: Ability to achieve and maintain adequate cardiopulmonary perfusion will improve Outcome: Progressing   Problem: Activity: Goal: Ability to tolerate increased activity will improve Outcome: Progressing   Problem: Clinical Measurements: Goal: Ability to maintain a body temperature in the normal range will improve Outcome: Progressing   Problem: Respiratory: Goal: Ability to maintain adequate ventilation will improve Outcome: Progressing Goal: Ability to maintain a clear airway will improve Outcome: Progressing

## 2024-04-14 NOTE — Progress Notes (Signed)
 PROGRESS NOTE    Theresa Rios  FMW:[id number] DOB: 1966-03-31 DOA: 04/07/2024 PCP: Scarlett Ronal Caldron, NP  Chief Complaint  Patient presents with   Shortness of Breath   Dysarthria    Brief Narrative:   58 y.o. female with medical history significant of depression, anxiety, cocaine abuse, prior suicide attempt, epilepsy, expressive aphasia, hepatic encephalopathy, tobacco abuse, alcohol use disorder currently undergoing treatment at [facility] rehab.  She was sent to the ED yesterday from rehab for evaluation of slurred speech and confusion. Being treated for possible pneumonia and ESBL. E.coli UTI. ID consulted for worsening leukocytosis in the absence of fever. CT Abdomen showed pyelonephritis and Odontogram showed periapical abscess.   Assessment & Plan:   Principal Problem:   Leukocytosis Active Problems:   Acute hypoxemic respiratory failure (HCC)   Acute anemia   Sepsis (HCC)   CAP (community acquired pneumonia)   AKI (acute kidney injury) (HCC)   Acute encephalopathy   Thrombocytopenia (HCC)   ESBL E. coli carrier   Poor dentition   Dental abscess   Seizures (HCC)  Sepsis  Possible UTI and Pyelonephritis  Periapical Abscess  Perihilar Interstitial Opacities, possible Pneumonia Several possible sources  White count remains significantly elevated today, 34.9 - continue to trend Urine culture with ESBL e. Coli Blood cultures NGTD Negative RVP  SARS-CoV-2 PCR negative CT with findings concerning for pyelo - orthopantogram with periapical abscess - head CT with frothy secretions in L maxillary sinus ID recommending 2 weeks agumentin    ESBL. E.coli, asymptomatic:  Abx for UTI d/c'd with lack of specific symptoms   AKI Baseline creatinine 0.71, creatinine peaked at 2.61  CT without hydro Will repeat UA  Not clear to me why her renal function hasn't recovered further yet.  Urine sodium low at presentation.   Consider repeat imaging, esp with white count above.     Interstitial edema Volume Overload HFpEF  Currently appears euvolemic, recent cehst CT without acute intrathoracic abnormality   Acute hypoxemic respiratory failure,POA:  Likely multifactorial from pneumonia and acute CHF.  Resolved  Acute metabolic encephalopathy Resolved, in the setting of infection above? Head CT without acute intracranial abnormality  Delirium precautions   Microcytic anemia/iron deficiency anemia: 7.7 relatively stable Low iron, normal ferritin - normal b12 and folate Every other day iron   Mild thrombocytopenia resolved   Hyponatremia resolved   Elevated alkaline phosphatase Transaminases and T. bili normal.  F/u GGT (wnl).   Seizure disorder Continue Depakote    Alcohol use disorder No ongoing alcohol use as patient is currently undergoing treatment at [facility] rehab and per recent PCP note, she has been there for for the past 3 months.  She is tachycardic but no other signs of withdrawal at this time. Continue with thiamine  and folate supplementation.   Possible Acute left maxillary sinusitis CT showing frothy secretions in the left maxillary sinus.  Does have significant leukocytosis on labs, however, patient denies any sinus pain/pressure or purulent nasal discharge. On abx as above   Major moderate Depression/anxiety Home remeron  on hold     DVT prophylaxis: SCD Code Status: full Family Communication: none Disposition:   Status is: Inpatient Remains inpatient appropriate because: need for continued inpatient care   Consultants:  Infectious disease  Procedures:  Echo IMPRESSIONS     1. Left ventricular ejection fraction, by estimation, is 60 to 65%. The  left ventricle has normal function. The left ventricle has no regional  wall motion abnormalities. Left ventricular diastolic parameters were  normal.   2. Right ventricular systolic function is normal. The right ventricular  size is normal.   3. Left atrial size was  mildly dilated.   4. The mitral valve is normal in structure. Mild mitral valve  regurgitation. No evidence of mitral stenosis.   5. The aortic valve is normal in structure. Aortic valve regurgitation is  not visualized. No aortic stenosis is present.   6. The inferior vena cava is normal in size with greater than 50%  respiratory variability, suggesting right atrial pressure of 3 mmHg.   Conclusion(s)/Recommendation(s): Technically limited study due to poor  sound wave transmission.    Antimicrobials:  Anti-infectives (From admission, onward)    Start     Dose/Rate Route Frequency Ordered Stop   04/13/24 1200  amoxicillin -clavulanate (AUGMENTIN ) 500-125 MG per tablet 1 tablet        1 tablet Oral 2 times daily 04/13/24 1101     04/10/24 1100  levofloxacin  (LEVAQUIN ) tablet 750 mg  Status:  Discontinued        750 mg Oral Every 48 hours 04/10/24 1007 04/12/24 1026   04/09/24 0600  cefTRIAXone  (ROCEPHIN ) 1 g in sodium chloride  0.9 % 100 mL IVPB  Status:  Discontinued        1 g 200 mL/hr over 30 Minutes Intravenous Every 24 hours 04/08/24 0621 04/10/24 1008   04/08/24 2000  doxycycline  (VIBRA -TABS) tablet 100 mg  Status:  Discontinued        100 mg Oral Every 12 hours 04/08/24 1405 04/08/24 1408   04/08/24 2000  doxycycline  (VIBRA -TABS) tablet 100 mg  Status:  Discontinued        100 mg Oral Every 12 hours 04/08/24 1408 04/10/24 1050   04/08/24 0800  doxycycline  (VIBRAMYCIN ) 100 mg in sodium chloride  0.9 % 250 mL IVPB  Status:  Discontinued        100 mg 125 mL/hr over 120 Minutes Intravenous Every 12 hours 04/08/24 0621 04/08/24 1409   04/08/24 0715  cefTRIAXone  (ROCEPHIN ) 1 g in sodium chloride  0.9 % 100 mL IVPB        1 g 200 mL/hr over 30 Minutes Intravenous  Once 04/08/24 0622 04/08/24 1448       Subjective: No complaints  Objective: Vitals:   04/14/24 0020 04/14/24 0415 04/14/24 0804 04/14/24 1201  BP: 97/66 102/68 96/73   Pulse: (!) 104 (!) 105 100 100  Resp: 20 19     Temp: 99.5 F (37.5 C) 99.5 F (37.5 C) 99 F (37.2 C) 98.5 F (36.9 C)  TempSrc: Oral Oral Oral Oral  SpO2: 99% 98%    Weight:  81.2 kg    Height:        Intake/Output Summary (Last 24 hours) at 04/14/2024 1343 Last data filed at 04/14/2024 0412 Gross per 24 hour  Intake 600 ml  Output 2195 ml  Net -1595 ml   Filed Weights   04/12/24 0500 04/13/24 0424 04/14/24 0415  Weight: 82.3 kg 85.1 kg 81.2 kg    Examination:  General exam: Appears calm and comfortable  Respiratory system: Clear to auscultation. Respiratory effort normal. Cardiovascular system: RRR Gastrointestinal system: Abdomen is nondistended, soft and nontender. Central nervous system: Alert and oriented. No focal neurological deficits. Extremities: no LEE  Data Reviewed: I have personally reviewed following labs and imaging studies  CBC: Recent Labs  Lab 04/10/24 0928 04/11/24 0406 04/12/24 0508 04/13/24 0556 04/14/24 0509  WBC 29.8* 32.1* 36.4* 36.3* 34.9*  NEUTROABS 22.9* 17.3* 19.3* 30.5*  30.7*  HGB 7.9* 7.7* 7.8* 7.6* 7.7*  HCT 24.3* 23.4* 23.8* 23.4* 23.8*  MCV 80.5 79.9* 79.9* 80.4 81.5  PLT 169 190 250 312 366    Basic Metabolic Panel: Recent Labs  Lab 04/10/24 0928 04/11/24 0406 04/12/24 1005 04/13/24 0556 04/14/24 0509  NA 135 137 140 139 138  K 3.7 4.1 4.8 4.9 4.7  CL 103 107 108 109 108  CO2 19* 19* 21* 21* 20*  GLUCOSE 122* 120* 203* 111* 153*  BUN 51* 44* 37* 33* 30*  CREATININE 2.57* 2.39* 2.47* 2.34* 2.34*  CALCIUM  8.3* 8.1* 8.1* 8.1* 8.4*  MG  --  2.6*  --   --   --     GFR: Estimated Creatinine Clearance: 25.9 mL/min (Tramon Crescenzo) (by C-G formula based on SCr of 2.34 mg/dL (H)).  Liver Function Tests: Recent Labs  Lab 04/07/24 1539 04/08/24 1150 04/10/24 0928  AST 16 14* 14*  ALT 12 12 11   ALKPHOS 193* 165* 157*  BILITOT 0.8 1.0 0.7  PROT 6.2* 5.7* 5.8*  ALBUMIN 3.1* 2.0* 1.9*    CBG: No results for input(s): GLUCAP in the last 168 hours.   Recent Results  (from the past 240 hours)  SARS Coronavirus 2 by RT PCR (hospital order, performed in [facility] hospital lab) *cepheid single result test* Anterior Nasal Swab     Status: None   Collection Time: 04/07/24  3:49 PM   Specimen: Anterior Nasal Swab  Result Value Ref Range Status   SARS Coronavirus 2 by RT PCR NEGATIVE NEGATIVE Final    Comment: (NOTE) SARS-CoV-2 target nucleic acids are NOT DETECTED.  The SARS-CoV-2 RNA is generally detectable in upper and lower respiratory specimens during the acute phase of infection. The lowest concentration of SARS-CoV-2 viral copies this assay can detect is 250 copies / mL. Stanislav Gervase negative result does not preclude SARS-CoV-2 infection and should not be used as the sole basis for treatment or other patient management decisions.  Lanesha Azzaro negative result may occur with improper specimen collection / handling, submission of specimen other than nasopharyngeal swab, presence of viral mutation(s) within the areas targeted by this assay, and inadequate number of viral copies (<250 copies / mL). Krysten Veronica negative result must be combined with clinical observations, patient history, and epidemiological information.  Fact Sheet for Patients:   RoadLapTop.co.za  Fact Sheet for Healthcare Providers: http://kim-miller.com/  This test is not yet approved or  cleared by the [place]  FDA and has been authorized for detection and/or diagnosis of SARS-CoV-2 by FDA under an Emergency Use Authorization (EUA).  This EUA will remain in effect (meaning this test can be used) for the duration of the COVID-19 declaration under Section 564(b)(1) of the Act, 21 U.S.C. section 360bbb-3(b)(1), unless the authorization is terminated or revoked sooner.  Performed at [facility], [address] [place], [place] [address]   Urine Culture (for pregnant, neutropenic or urologic patients or patients with an indwelling urinary catheter)      Status: Abnormal   Collection Time: 04/08/24  6:22 AM   Specimen: Urine, Clean Catch  Result Value Ref Range Status   Specimen Description URINE, CLEAN CATCH  Final   Special Requests   Final    NONE Performed at [facility], [address], [place], [place] [address]    Culture (Tarea Skillman)  Final    50,000 COLONIES/mL ESCHERICHIA COLI Confirmed Extended Spectrum Beta-Lactamase Producer (ESBL).  In bloodstream infections from ESBL organisms, carbapenems are preferred  over piperacillin/tazobactam. They are shown to have Peaches Vanoverbeke lower risk of mortality.    Report Status 04/10/2024 FINAL  Final   Organism ID, Bacteria ESCHERICHIA COLI (Beverly Suriano)  Final      Susceptibility   Escherichia coli - MIC*    AMPICILLIN  >=32 RESISTANT Resistant     CEFAZOLIN >=64 RESISTANT Resistant     CEFEPIME 16 RESISTANT Resistant     CEFTRIAXONE  >=64 RESISTANT Resistant     CIPROFLOXACIN <=0.25 SENSITIVE Sensitive     GENTAMICIN >=16 RESISTANT Resistant     IMIPENEM <=0.25 SENSITIVE Sensitive     NITROFURANTOIN  <=16 SENSITIVE Sensitive     TRIMETH/SULFA >=320 RESISTANT Resistant     AMPICILLIN /SULBACTAM >=32 RESISTANT Resistant     PIP/TAZO <=4 SENSITIVE Sensitive ug/mL    * 50,000 COLONIES/mL ESCHERICHIA COLI  Respiratory (~20 pathogens) panel by PCR     Status: None   Collection Time: 04/08/24  7:00 AM   Specimen: Nasopharyngeal Swab; Respiratory  Result Value Ref Range Status   Adenovirus NOT DETECTED NOT DETECTED Final   Coronavirus 229E NOT DETECTED NOT DETECTED Final    Comment: (NOTE) The Coronavirus on the Respiratory Panel, DOES NOT test for the novel  Coronavirus (2019 nCoV)    Coronavirus HKU1 NOT DETECTED NOT DETECTED Final   Coronavirus NL63 NOT DETECTED NOT DETECTED Final   Coronavirus OC43 NOT DETECTED NOT DETECTED Final   Metapneumovirus NOT DETECTED NOT DETECTED Final   Rhinovirus / Enterovirus NOT DETECTED NOT DETECTED Final   Influenza Zevin Nevares NOT DETECTED NOT DETECTED Final   Influenza B NOT  DETECTED NOT DETECTED Final   Parainfluenza Virus 1 NOT DETECTED NOT DETECTED Final   Parainfluenza Virus 2 NOT DETECTED NOT DETECTED Final   Parainfluenza Virus 3 NOT DETECTED NOT DETECTED Final   Parainfluenza Virus 4 NOT DETECTED NOT DETECTED Final   Respiratory Syncytial Virus NOT DETECTED NOT DETECTED Final   Bordetella pertussis NOT DETECTED NOT DETECTED Final   Bordetella Parapertussis NOT DETECTED NOT DETECTED Final   Chlamydophila pneumoniae NOT DETECTED NOT DETECTED Final   Mycoplasma pneumoniae NOT DETECTED NOT DETECTED Final    Comment: Performed at [facility], [address] [place], [place] [address]  Culture, blood (routine x 2) Call MD if unable to obtain prior to antibiotics being given     Status: None   Collection Time: 04/08/24 11:31 AM   Specimen: BLOOD  Result Value Ref Range Status   Specimen Description BLOOD SITE NOT SPECIFIED  Final   Special Requests   Final    BOTTLES DRAWN AEROBIC AND ANAEROBIC Blood Culture adequate volume   Culture   Final    NO GROWTH 5 DAYS Performed at [facility], [address] [place], [place] [address]    Report Status 04/13/2024 FINAL  Final  Culture, blood (routine x 2) Call MD if unable to obtain prior to antibiotics being given     Status: None   Collection Time: 04/08/24 11:53 AM   Specimen: BLOOD  Result Value Ref Range Status   Specimen Description BLOOD SITE NOT SPECIFIED  Final   Special Requests   Final    BOTTLES DRAWN AEROBIC AND ANAEROBIC Blood Culture adequate volume   Culture   Final    NO GROWTH 5 DAYS Performed at [facility], [address], [place], [place] [address]    Report Status 04/13/2024 FINAL  Final  MRSA Next Gen by PCR, Nasal     Status: None  Collection Time: 04/08/24 11:45 PM   Specimen: Nasal Mucosa; Nasal Swab  Result Value Ref Range Status   MRSA by PCR Next Gen NOT DETECTED NOT DETECTED Final    Comment: (NOTE) The GeneXpert MRSA Assay (FDA approved for NASAL  specimens only), is one component of Jahdai Padovano comprehensive MRSA colonization surveillance program. It is not intended to diagnose MRSA infection nor to guide or monitor treatment for MRSA infections. Test performance is not FDA approved in patients less than 38 years old. Performed at [facility], [address] [place], [place] [address]          Radiology Studies: CT CHEST ABDOMEN PELVIS WO CONTRAST Result Date: 04/13/2024 CLINICAL DATA:  Fever short of breath EXAM: CT CHEST, ABDOMEN AND PELVIS WITHOUT CONTRAST TECHNIQUE: Multidetector CT imaging of the chest, abdomen and pelvis was performed following the standard protocol without IV contrast. RADIATION DOSE REDUCTION: This exam was performed according to the departmental dose-optimization program which includes automated exposure control, adjustment of the mA and/or kV according to patient size and/or use of iterative reconstruction technique. COMPARISON:  CT 05/06/2022, chest x-ray 04/11/2024 FINDINGS: CT CHEST FINDINGS Cardiovascular: Limited evaluation without intravenous contrast. Mild atherosclerosis. No aneurysm. Mild cardiomegaly. No pericardial effusion Mediastinum/Nodes: Patent trachea. No thyroid  mass. No suspicious lymph nodes. Esophagus within normal limits. Lungs/Pleura: No acute airspace disease, pleural effusion, or pneumothorax Musculoskeletal: The liver is enlarged measuring 21 cm. Diffuse decreased density consistent with steatosis. No calcified gallstone or biliary dilatation CT ABDOMEN PELVIS FINDINGS Hepatobiliary: No focal liver abnormality is seen. No gallstones, gallbladder wall thickening, or biliary dilatation. Pancreas: Unremarkable. No pancreatic ductal dilatation or surrounding inflammatory changes. Spleen: Negative Adrenals/Urinary Tract: Thickened adrenals without dominant mass. Edematous appearing kidneys with prominent perinephric fat stranding. No hydronephrosis or obstructing stone. Slightly thick walled  appearance of the bladder Stomach/Bowel: Stomach is nonenlarged. No dilated small bowel. Negative appendix. No acute bowel wall thickening Vascular/Lymphatic: Aortic atherosclerosis. No enlarged abdominal or pelvic lymph nodes. Reproductive: Uterus and bilateral adnexa are unremarkable. Other: Small volume free fluid in the pelvis. No free air. Fat containing left greater than right inguinal hernias. Soft tissue stranding and small volume fluid within the left inguinal hernia. Musculoskeletal: No acute osseous abnormality IMPRESSION: 1. No CT evidence for acute intrathoracic abnormality.  Cardiomegaly 2. Edematous appearing kidneys with prominent perinephric fat stranding. Slightly thick-walled appearance of the bladder. Imaging appearance suggest possible UTI and pyelonephritis. 3. Hepatomegaly with hepatic steatosis. 4. Fat containing left greater than right inguinal hernias. Soft tissue stranding and small volume fluid within the left inguinal hernia, cannot exclude incarcerated fat containing left inguinal hernia. 5. Aortic atherosclerosis. Aortic Atherosclerosis (ICD10-I70.0). Electronically Signed   By: Luke Bun M.D.   On: 04/13/2024 01:41        Scheduled Meds:  amoxicillin -clavulanate  1 tablet Oral BID   cholecalciferol   400 Units Oral Daily   divalproex   500 mg Oral BID   ferrous sulfate   325 mg Oral Daily   folic acid   1 mg Oral Daily   thiamine   100 mg Oral Daily   Continuous Infusions:   LOS: 6 days    Time spent: over 30 min    Meliton Monte, MD [facility]   To contact the attending provider between 7A-7P or the covering provider during after hours 7P-7A, please log into the web site [email or web address] and access using universal [place] password for that web site. If you do not have the password, please call the hospital operator.  04/14/2024, 1:43 PM

## 2024-04-14 NOTE — Progress Notes (Signed)
 Mobility Specialist Progress Note;   04/14/24 1122  Mobility  Activity Ambulated with assistance  Level of Assistance Contact guard assist, steadying assist  Assistive Device None  Distance Ambulated (ft) 350 ft  Activity Response Tolerated well  Mobility Referral Yes  Mobility visit 1 Mobility  Mobility Specialist Start Time (ACUTE ONLY) 1122  Mobility Specialist Stop Time (ACUTE ONLY) 1141  Mobility Specialist Time Calculation (min) (ACUTE ONLY) 19 min   Pt eager for mobility. Requested to use BR at [facility], void successful. Required MinG assistance for safety, 1x LOB noted. VSS throughout, only c/o being cold. Requested to sit in chair at [facility]. Pt left in chair with all needs met, alarm on.   Lauraine Erm Mobility Specialist Please contact via SecureChat or [employer] [phone]

## 2024-04-14 NOTE — TOC Progression Note (Signed)
 Transition of Care [facility]) - Progression Note    Patient Details  Name: Theresa Rios MRN: [id number] Date of Birth: 1966-05-18  [facility] CM/SW Contact  Graves-Bigelow, Erminio Deems, RN Phone Number: 04/14/2024, 11:17 AM  Clinical Narrative:  Case Manager spoke with patient regarding dental needs and the patient is without a dentist. Patient is agreeable for Case Manager to call [facility] on [address] and they can see the patient August 13 th at 1300. Information has been placed on the AVS. Patient is agreeable to appointment. Patient does not have a cell phone so the office can call her. Patient will provide Case Manager with a friends number to provide to the office. Call placed to PCP to see if the patient can be seen in the office. Case Manager awaiting call back to see if an appointment can be scheduled.   Expected Discharge Plan: Home/Self Care Barriers to Discharge: No Barriers Identified  Expected Discharge Plan and Services In-house Referral: Clinical Social Work Discharge Planning Services: CM Consult, Follow-up appt scheduled, Other - See comment (Dentist appointment scheduled.) Post Acute Care Choice: NA Living arrangements for the past 2 months: No permanent address                   DME Agency: NA       HH Arranged: NA  Social Drivers of Health (SDOH) Interventions SDOH Screenings   Food Insecurity: Food Insecurity Present (04/08/2024)  Housing: Unknown (04/12/2024)  Transportation Needs: Unmet Transportation Needs (04/12/2024)  Utilities: At Risk (04/08/2024)  Alcohol Screen: High Risk (01/08/2024)  Depression (PHQ2-9): Low Risk  (01/15/2024)  Recent Concern: Depression (PHQ2-9) - High Risk (01/12/2024)  Financial Resource Strain: Low Risk  (04/12/2024)  Tobacco Use: High Risk (04/08/2024)    Readmission Risk Interventions     No data to display

## 2024-04-14 NOTE — Plan of Care (Signed)
 ?  Problem: Clinical Measurements: ?Goal: Ability to maintain clinical measurements within normal limits will improve ?Outcome: Progressing ?Goal: Will remain free from infection ?Outcome: Progressing ?Goal: Diagnostic test results will improve ?Outcome: Progressing ?  ?

## 2024-04-15 DIAGNOSIS — D649 Anemia, unspecified: Secondary | ICD-10-CM

## 2024-04-15 LAB — CBC WITH DIFFERENTIAL/PLATELET
Abs Immature Granulocytes: 3.93 K/uL — ABNORMAL HIGH (ref 0.00–0.07)
Basophils Absolute: 0 K/uL (ref 0.0–0.1)
Basophils Relative: 0 %
Eosinophils Absolute: 0.2 K/uL (ref 0.0–0.5)
Eosinophils Relative: 1 %
HCT: 24.7 % — ABNORMAL LOW (ref 36.0–46.0)
Hemoglobin: 7.9 g/dL — ABNORMAL LOW (ref 12.0–15.0)
Immature Granulocytes: 13 %
Lymphocytes Relative: 8 %
Lymphs Abs: 2.5 K/uL (ref 0.7–4.0)
MCH: 25.8 pg — ABNORMAL LOW (ref 26.0–34.0)
MCHC: 32 g/dL (ref 30.0–36.0)
MCV: 80.7 fL (ref 80.0–100.0)
Monocytes Absolute: 2.7 K/uL — ABNORMAL HIGH (ref 0.1–1.0)
Monocytes Relative: 9 %
Neutro Abs: 20.3 K/uL — ABNORMAL HIGH (ref 1.7–7.7)
Neutrophils Relative %: 69 %
Platelets: 438 K/uL — ABNORMAL HIGH (ref 150–400)
RBC: 3.06 MIL/uL — ABNORMAL LOW (ref 3.87–5.11)
RDW: 15.7 % — ABNORMAL HIGH (ref 11.5–15.5)
Smear Review: NORMAL
WBC: 29.6 K/uL — ABNORMAL HIGH (ref 4.0–10.5)
nRBC: 0 % (ref 0.0–0.2)

## 2024-04-15 LAB — COMPREHENSIVE METABOLIC PANEL WITH GFR
ALT: 51 U/L — ABNORMAL HIGH (ref 0–44)
AST: 62 U/L — ABNORMAL HIGH (ref 15–41)
Albumin: 2.1 g/dL — ABNORMAL LOW (ref 3.5–5.0)
Alkaline Phosphatase: 109 U/L (ref 38–126)
Anion gap: 9 (ref 5–15)
BUN: 27 mg/dL — ABNORMAL HIGH (ref 6–20)
CO2: 21 mmol/L — ABNORMAL LOW (ref 22–32)
Calcium: 8.5 mg/dL — ABNORMAL LOW (ref 8.9–10.3)
Chloride: 109 mmol/L (ref 98–111)
Creatinine, Ser: 2.2 mg/dL — ABNORMAL HIGH (ref 0.44–1.00)
GFR, Estimated: 25 mL/min — ABNORMAL LOW (ref >60)
Glucose, Bld: 105 mg/dL — ABNORMAL HIGH (ref 70–99)
Potassium: 4.7 mmol/L (ref 3.5–5.1)
Sodium: 139 mmol/L (ref 135–145)
Total Bilirubin: 0.6 mg/dL (ref 0.0–1.2)
Total Protein: 6.8 g/dL (ref 6.5–8.1)

## 2024-04-15 LAB — MAGNESIUM: Magnesium: 2.2 mg/dL (ref 1.7–2.4)

## 2024-04-15 LAB — PHOSPHORUS: Phosphorus: 5.6 mg/dL — ABNORMAL HIGH (ref 2.5–4.6)

## 2024-04-15 MED ORDER — LACTATED RINGERS IV BOLUS
1000.0000 mL | Freq: Once | INTRAVENOUS | Status: AC
  Administered 2024-04-15: 1000 mL via INTRAVENOUS

## 2024-04-15 NOTE — Progress Notes (Signed)
 PROGRESS NOTE    Theresa Rios  FMW:[id number] DOB: 07/14/1966 DOA: 04/07/2024 PCP: Scarlett Ronal Caldron, NP  Chief Complaint  Patient presents with   Shortness of Breath   Dysarthria    Brief Narrative:   58 y.o. female with medical history significant of depression, anxiety, cocaine abuse, prior suicide attempt, epilepsy, expressive aphasia, hepatic encephalopathy, tobacco abuse, alcohol use disorder currently undergoing treatment at [facility] rehab.  She was sent to the ED yesterday from rehab for evaluation of slurred speech and confusion. Being treated for possible pneumonia and ESBL. E.coli UTI. ID consulted for worsening leukocytosis in the absence of fever. CT Abdomen showed pyelonephritis and Odontogram showed periapical abscess.   Assessment & Plan:   Principal Problem:   Leukocytosis Active Problems:   Acute respiratory failure with hypoxia (HCC)   Acute anemia   Sepsis (HCC)   CAP (community acquired pneumonia)   AKI (acute kidney injury) (HCC)   Acute encephalopathy   Thrombocytopenia (HCC)   ESBL E. coli carrier   Poor dentition   Dental abscess   Seizures (HCC)  Sepsis  Possible UTI and Pyelonephritis  Periapical Abscess  Perihilar Interstitial Opacities, possible Pneumonia Several possible sources  White count mildly improved, will trend Urine culture with ESBL e. Coli Blood cultures NGTD Negative RVP  SARS-CoV-2 PCR negative CT with findings concerning for pyelo - orthopantogram with periapical abscess - head CT with frothy secretions in L maxillary sinus ID recommending 2 weeks agumentin    ESBL. E.coli, asymptomatic:  Abx for UTI d/c'd with lack of specific symptoms   AKI Baseline creatinine 0.71, creatinine peaked at 2.61  CT without hydro Fena suggestive of prerenal cause, will bolus 1 L and follow  Not clear to me why her renal function hasn't recovered further yet.  Urine sodium low at presentation.    Interstitial edema Volume Overload HFpEF   Currently appears euvolemic, recent cehst CT without acute intrathoracic abnormality   Acute hypoxemic respiratory failure,POA:  Likely multifactorial from pneumonia and acute CHF.  Resolved  Acute metabolic encephalopathy Resolved, in the setting of infection above? Head CT without acute intracranial abnormality  Delirium precautions   Microcytic anemia/iron deficiency anemia: 7.7 relatively stable Low iron, normal ferritin - normal b12 and folate Every other day iron   Mild thrombocytopenia resolved   Hyponatremia resolved   Elevated alkaline phosphatase Transaminases and T. bili normal.  F/u GGT (wnl).   Seizure disorder Continue Depakote    Alcohol use disorder No ongoing alcohol use as patient is currently undergoing treatment at [facility] rehab and per recent PCP note, she has been there for for the past 3 months.  She is tachycardic but no other signs of withdrawal at this time. Continue with thiamine  and folate supplementation.   Possible Acute left maxillary sinusitis CT showing frothy secretions in the left maxillary sinus.  Does have significant leukocytosis on labs, however, patient denies any sinus pain/pressure or purulent nasal discharge. On abx as above   Major moderate Depression/anxiety Home remeron  on hold     DVT prophylaxis: SCD Code Status: full Family Communication: none Disposition:   Status is: Inpatient Remains inpatient appropriate because: need for continued inpatient care   Consultants:  Infectious disease  Procedures:  Echo IMPRESSIONS     1. Left ventricular ejection fraction, by estimation, is 60 to 65%. The  left ventricle has normal function. The left ventricle has no regional  wall motion abnormalities. Left ventricular diastolic parameters were  normal.   2. Right  ventricular systolic function is normal. The right ventricular  size is normal.   3. Left atrial size was mildly dilated.   4. The mitral valve is normal in  structure. Mild mitral valve  regurgitation. No evidence of mitral stenosis.   5. The aortic valve is normal in structure. Aortic valve regurgitation is  not visualized. No aortic stenosis is present.   6. The inferior vena cava is normal in size with greater than 50%  respiratory variability, suggesting right atrial pressure of 3 mmHg.   Conclusion(s)/Recommendation(s): Technically limited study due to poor  sound wave transmission.    Antimicrobials:  Anti-infectives (From admission, onward)    Start     Dose/Rate Route Frequency Ordered Stop   04/13/24 1200  amoxicillin -clavulanate (AUGMENTIN ) 500-125 MG per tablet 1 tablet        1 tablet Oral 2 times daily 04/13/24 1101     04/10/24 1100  levofloxacin  (LEVAQUIN ) tablet 750 mg  Status:  Discontinued        750 mg Oral Every 48 hours 04/10/24 1007 04/12/24 1026   04/09/24 0600  cefTRIAXone  (ROCEPHIN ) 1 g in sodium chloride  0.9 % 100 mL IVPB  Status:  Discontinued        1 g 200 mL/hr over 30 Minutes Intravenous Every 24 hours 04/08/24 0621 04/10/24 1008   04/08/24 2000  doxycycline  (VIBRA -TABS) tablet 100 mg  Status:  Discontinued        100 mg Oral Every 12 hours 04/08/24 1405 04/08/24 1408   04/08/24 2000  doxycycline  (VIBRA -TABS) tablet 100 mg  Status:  Discontinued        100 mg Oral Every 12 hours 04/08/24 1408 04/10/24 1050   04/08/24 0800  doxycycline  (VIBRAMYCIN ) 100 mg in sodium chloride  0.9 % 250 mL IVPB  Status:  Discontinued        100 mg 125 mL/hr over 120 Minutes Intravenous Every 12 hours 04/08/24 0621 04/08/24 1409   04/08/24 0715  cefTRIAXone  (ROCEPHIN ) 1 g in sodium chloride  0.9 % 100 mL IVPB        1 g 200 mL/hr over 30 Minutes Intravenous  Once 04/08/24 0622 04/08/24 1448       Subjective: No complaints  Objective: Vitals:   04/15/24 0358 04/15/24 0500 04/15/24 0800 04/15/24 1153  BP: 121/75  106/62 104/67  Pulse: (!) 105  95 95  Resp: 18  18 18   Temp: 98.6 F (37 C)  98.4 F (36.9 C) 97.9 F  (36.6 C)  TempSrc: Oral  Oral Oral  SpO2: 96%  97% 98%  Weight:  81 kg    Height:        Intake/Output Summary (Last 24 hours) at 04/15/2024 1347 Last data filed at 04/15/2024 1000 Gross per 24 hour  Intake 240 ml  Output 200 ml  Net 40 ml   Filed Weights   04/13/24 0424 04/14/24 0415 04/15/24 0500  Weight: 85.1 kg 81.2 kg 81 kg    Examination:  General: No acute distress. Cardiovascular: RRR Lungs: unlabored Abdomen: Soft, nontender, nondistended  Neurological: Alert and oriented 3. Moves all extremities 4 with equal strength. Cranial nerves II through XII grossly intact. Extremities: No clubbing or cyanosis. No edema.   Data Reviewed: I have personally reviewed following labs and imaging studies  CBC: Recent Labs  Lab 04/11/24 0406 04/12/24 0508 04/13/24 0556 04/14/24 0509 04/15/24 0527  WBC 32.1* 36.4* 36.3* 34.9* 29.6*  NEUTROABS 17.3* 19.3* 30.5* 30.7* 20.3*  HGB 7.7* 7.8* 7.6* 7.7* 7.9*  HCT 23.4* 23.8* 23.4* 23.8* 24.7*  MCV 79.9* 79.9* 80.4 81.5 80.7  PLT 190 250 312 366 438*    Basic Metabolic Panel: Recent Labs  Lab 04/11/24 0406 04/12/24 1005 04/13/24 0556 04/14/24 0509 04/15/24 0527  NA 137 140 139 138 139  K 4.1 4.8 4.9 4.7 4.7  CL 107 108 109 108 109  CO2 19* 21* 21* 20* 21*  GLUCOSE 120* 203* 111* 153* 105*  BUN 44* 37* 33* 30* 27*  CREATININE 2.39* 2.47* 2.34* 2.34* 2.20*  CALCIUM  8.1* 8.1* 8.1* 8.4* 8.5*  MG 2.6*  --   --   --  2.2  PHOS  --   --   --   --  5.6*    GFR: Estimated Creatinine Clearance: 27.5 mL/min (Claus Silvestro) (by C-G formula based on SCr of 2.2 mg/dL (H)).  Liver Function Tests: Recent Labs  Lab 04/10/24 0928 04/15/24 0527  AST 14* 62*  ALT 11 51*  ALKPHOS 157* 109  BILITOT 0.7 0.6  PROT 5.8* 6.8  ALBUMIN 1.9* 2.1*    CBG: No results for input(s): GLUCAP in the last 168 hours.   Recent Results (from the past 240 hours)  SARS Coronavirus 2 by RT PCR (hospital order, performed in [facility] hospital lab)  *cepheid single result test* Anterior Nasal Swab     Status: None   Collection Time: 04/07/24  3:49 PM   Specimen: Anterior Nasal Swab  Result Value Ref Range Status   SARS Coronavirus 2 by RT PCR NEGATIVE NEGATIVE Final    Comment: (NOTE) SARS-CoV-2 target nucleic acids are NOT DETECTED.  The SARS-CoV-2 RNA is generally detectable in upper and lower respiratory specimens during the acute phase of infection. The lowest concentration of SARS-CoV-2 viral copies this assay can detect is 250 copies / mL. Gahel Safley negative result does not preclude SARS-CoV-2 infection and should not be used as the sole basis for treatment or other patient management decisions.  Larue Lightner negative result may occur with improper specimen collection / handling, submission of specimen other than nasopharyngeal swab, presence of viral mutation(s) within the areas targeted by this assay, and inadequate number of viral copies (<250 copies / mL). Marcella Dunnaway negative result must be combined with clinical observations, patient history, and epidemiological information.  Fact Sheet for Patients:   RoadLapTop.co.za  Fact Sheet for Healthcare Providers: http://kim-miller.com/  This test is not yet approved or  cleared by the [place]  FDA and has been authorized for detection and/or diagnosis of SARS-CoV-2 by FDA under an Emergency Use Authorization (EUA).  This EUA will remain in effect (meaning this test can be used) for the duration of the COVID-19 declaration under Section 564(b)(1) of the Act, 21 U.S.C. section 360bbb-3(b)(1), unless the authorization is terminated or revoked sooner.  Performed at [facility], [address] [place], [place] [address]   Urine Culture (for pregnant, neutropenic or urologic patients or patients with an indwelling urinary catheter)     Status: Abnormal   Collection Time: 04/08/24  6:22 AM   Specimen: Urine, Clean Catch  Result Value Ref  Range Status   Specimen Description URINE, CLEAN CATCH  Final   Special Requests   Final    NONE Performed at [facility], [address] [place], [place] [address]    Culture (Askia Hazelip)  Final    50,000 COLONIES/mL ESCHERICHIA COLI Confirmed Extended Spectrum Beta-Lactamase Producer (ESBL).  In bloodstream infections from ESBL organisms, carbapenems are preferred over piperacillin/tazobactam. They are  shown to have Kennah Hehr lower risk of mortality.    Report Status 04/10/2024 FINAL  Final   Organism ID, Bacteria ESCHERICHIA COLI (Haillee Johann)  Final      Susceptibility   Escherichia coli - MIC*    AMPICILLIN  >=32 RESISTANT Resistant     CEFAZOLIN >=64 RESISTANT Resistant     CEFEPIME 16 RESISTANT Resistant     CEFTRIAXONE  >=64 RESISTANT Resistant     CIPROFLOXACIN <=0.25 SENSITIVE Sensitive     GENTAMICIN >=16 RESISTANT Resistant     IMIPENEM <=0.25 SENSITIVE Sensitive     NITROFURANTOIN  <=16 SENSITIVE Sensitive     TRIMETH/SULFA >=320 RESISTANT Resistant     AMPICILLIN /SULBACTAM >=32 RESISTANT Resistant     PIP/TAZO <=4 SENSITIVE Sensitive ug/mL    * 50,000 COLONIES/mL ESCHERICHIA COLI  Respiratory (~20 pathogens) panel by PCR     Status: None   Collection Time: 04/08/24  7:00 AM   Specimen: Nasopharyngeal Swab; Respiratory  Result Value Ref Range Status   Adenovirus NOT DETECTED NOT DETECTED Final   Coronavirus 229E NOT DETECTED NOT DETECTED Final    Comment: (NOTE) The Coronavirus on the Respiratory Panel, DOES NOT test for the novel  Coronavirus (2019 nCoV)    Coronavirus HKU1 NOT DETECTED NOT DETECTED Final   Coronavirus NL63 NOT DETECTED NOT DETECTED Final   Coronavirus OC43 NOT DETECTED NOT DETECTED Final   Metapneumovirus NOT DETECTED NOT DETECTED Final   Rhinovirus / Enterovirus NOT DETECTED NOT DETECTED Final   Influenza Nile Prisk NOT DETECTED NOT DETECTED Final   Influenza B NOT DETECTED NOT DETECTED Final   Parainfluenza Virus 1 NOT DETECTED NOT DETECTED Final   Parainfluenza Virus 2  NOT DETECTED NOT DETECTED Final   Parainfluenza Virus 3 NOT DETECTED NOT DETECTED Final   Parainfluenza Virus 4 NOT DETECTED NOT DETECTED Final   Respiratory Syncytial Virus NOT DETECTED NOT DETECTED Final   Bordetella pertussis NOT DETECTED NOT DETECTED Final   Bordetella Parapertussis NOT DETECTED NOT DETECTED Final   Chlamydophila pneumoniae NOT DETECTED NOT DETECTED Final   Mycoplasma pneumoniae NOT DETECTED NOT DETECTED Final    Comment: Performed at [facility], [address], [place], [place] [address]  Culture, blood (routine x 2) Call MD if unable to obtain prior to antibiotics being given     Status: None   Collection Time: 04/08/24 11:31 AM   Specimen: BLOOD  Result Value Ref Range Status   Specimen Description BLOOD SITE NOT SPECIFIED  Final   Special Requests   Final    BOTTLES DRAWN AEROBIC AND ANAEROBIC Blood Culture adequate volume   Culture   Final    NO GROWTH 5 DAYS Performed at [facility], [address], [place], [place] [address]    Report Status 04/13/2024 FINAL  Final  Culture, blood (routine x 2) Call MD if unable to obtain prior to antibiotics being given     Status: None   Collection Time: 04/08/24 11:53 AM   Specimen: BLOOD  Result Value Ref Range Status   Specimen Description BLOOD SITE NOT SPECIFIED  Final   Special Requests   Final    BOTTLES DRAWN AEROBIC AND ANAEROBIC Blood Culture adequate volume   Culture   Final    NO GROWTH 5 DAYS Performed at [facility], [address], [place], [place] [address]    Report Status 04/13/2024 FINAL  Final  MRSA Next Gen by PCR, Nasal     Status: None   Collection Time: 04/08/24  11:45 PM   Specimen: Nasal Mucosa; Nasal Swab  Result Value Ref Range Status   MRSA by PCR Next Gen NOT DETECTED NOT DETECTED Final    Comment: (NOTE) The GeneXpert MRSA Assay (FDA approved for NASAL specimens only), is one component of Lynn Recendiz comprehensive MRSA colonization surveillance program. It is not intended  to diagnose MRSA infection nor to guide or monitor treatment for MRSA infections. Test performance is not FDA approved in patients less than 83 years old. Performed at [facility], [address], [place], [place] [address]          Radiology Studies: No results found.       Scheduled Meds:  amoxicillin -clavulanate  1 tablet Oral BID   cholecalciferol   400 Units Oral Daily   divalproex   500 mg Oral BID   ferrous sulfate   325 mg Oral QODAY   folic acid   1 mg Oral Daily   thiamine   100 mg Oral Daily   Continuous Infusions:   LOS: 7 days    Time spent: over 30 min    Meliton Monte, MD [facility]   To contact the attending provider between 7A-7P or the covering provider during after hours 7P-7A, please log into the web site [email or web address] and access using universal Gibson password for that web site. If you do not have the password, please call the hospital operator.  04/15/2024, 1:47 PM

## 2024-04-15 NOTE — TOC Progression Note (Signed)
 Transition of Care [facility]) - Progression Note    Patient Details  Name: Theresa Rios MRN: [id number] Date of Birth: Jan 13, 1966  Transition of Care [facility]) CM/SW Contact  Graves-Bigelow, Erminio Deems, RN Phone Number: 04/15/2024, 3:35 PM  Clinical Narrative:  Case Manager has called the [facility] clinic to see if they can schedule a hospital follow up appointment for this patient. Patient will not be able to schedule another appointment since she has insurance now. CMA will attempt to schedule an appointment with a new provider before she discharges.   Expected Discharge Plan: Home/Self Care Barriers to Discharge: No Barriers Identified  Expected Discharge Plan and Services In-house Referral: Clinical Social Work Discharge Planning Services: CM Consult, Follow-up appt scheduled, Other - See comment (Dentist appointment scheduled.) Post Acute Care Choice: NA Living arrangements for the past 2 months: No permanent address                   DME Agency: NA      HH Arranged: NA  Social Drivers of Health (SDOH) Interventions SDOH Screenings   Food Insecurity: Food Insecurity Present (04/08/2024)  Housing: Unknown (04/12/2024)  Transportation Needs: Unmet Transportation Needs (04/12/2024)  Utilities: At Risk (04/08/2024)  Alcohol Screen: High Risk (01/08/2024)  Depression (PHQ2-9): Low Risk  (01/15/2024)  Recent Concern: Depression (PHQ2-9) - High Risk (01/12/2024)  Financial Resource Strain: Low Risk  (04/12/2024)  Tobacco Use: High Risk (04/08/2024)    Readmission Risk Interventions     No data to display

## 2024-04-15 NOTE — Plan of Care (Signed)
  Problem: Activity: Goal: Risk for activity intolerance will decrease Outcome: Progressing   Problem: Safety: Goal: Ability to remain free from injury will improve Outcome: Progressing   Problem: Education: Goal: Ability to demonstrate management of disease process will improve Outcome: Progressing Goal: Ability to verbalize understanding of medication therapies will improve Outcome: Progressing Goal: Individualized Educational Video(s) Outcome: Progressing   Problem: Activity: Goal: Ability to tolerate increased activity will improve Outcome: Progressing   Problem: Cardiac: Goal: Ability to achieve and maintain adequate cardiopulmonary perfusion will improve Outcome: Progressing

## 2024-04-15 NOTE — Progress Notes (Signed)
 Per handoff report from [place], RN & Sophie, RN, MD wants pt to now be non-tele.

## 2024-04-15 NOTE — Progress Notes (Signed)
 Physical Therapy Treatment Patient Details Name: Theresa Rios MRN: [id number] DOB: 07/04/1966 Today's Date: 04/15/2024   History of Present Illness 58 y.o. female admitted 04/07/24 for sepsis secondary to possible bacterial CAP. Pt presented with slurred speech and confusion. Found to have new onset CHF, acute hypoxemic respiratory failure, AKI, acute metabolic encephalopathy, and  E.coli UTI. PMHx: depression, anxiety, cocaine abuse, prior suicide attempt, epilepsy, expressive aphasia, hepatic encephalopathy, tobacco abuse, alcohol use disorder currently undergoing treatment at [facility].    PT Comments  Pt has met all goals, She is mobilizing safely at a supervision to mostly Independent level.    If plan is discharge home, recommend the following:  (PRN)   Can travel by private vehicle        Equipment Recommendations  None recommended by PT    Recommendations for Other Services       Precautions / Restrictions Precautions Precautions:  (lower fall risk) Recall of Precautions/Restrictions: Intact     Mobility  Bed Mobility Overal bed mobility: Independent                  Transfers       Sit to Stand: Modified independent (Device/Increase time)                Ambulation/Gait Ambulation/Gait assistance: Supervision Gait Distance (Feet): 800 Feet Assistive device: None Gait Pattern/deviations: Step-through pattern, Decreased stride length   Gait velocity interpretation: 1.31 - 2.62 ft/sec, indicative of limited community ambulator   General Gait Details: steady gait, age appropriate speed attained, but pt prefers slower, not deviation with abrupt turns, scanning.   Stairs Stairs: Yes Stairs assistance: Contact guard assist Stair Management: One rail Left, Alternating pattern, Forwards Number of Stairs: 3 General stair comments: safe with the rail   Wheelchair Mobility     Tilt Bed    Modified Rankin (Stroke Patients Only)        Balance     Sitting balance-Leahy Scale: Normal       Standing balance-Leahy Scale: Good                              Communication Communication Communication: No apparent difficulties  Cognition Arousal: Alert Behavior During Therapy: WFL for tasks assessed/performed   PT - Cognitive impairments: No apparent impairments                       PT - Cognition Comments: agree is easily distracted, but very redirectable Following commands: Intact      Cueing Cueing Techniques: Verbal cues  Exercises      General Comments        Pertinent Vitals/Pain Pain Assessment Pain Assessment: Faces Faces Pain Scale: No hurt Pain Intervention(s): Monitored during session    Home Living                          Prior Function            PT Goals (current goals can now be found in the care plan section) Acute Rehab PT Goals PT Goal Formulation: With patient Time For Goal Achievement: 04/24/24 Potential to Achieve Goals: Good Progress towards PT goals: Progressing toward goals    Frequency    Min 2X/week      PT Plan      Co-evaluation  AM-PAC PT 6 Clicks Mobility   Outcome Measure  Help needed turning from your back to your side while in a flat bed without using bedrails?: None Help needed moving from lying on your back to sitting on the side of a flat bed without using bedrails?: None Help needed moving to and from a bed to a chair (including a wheelchair)?: None Help needed standing up from a chair using your arms (e.g., wheelchair or bedside chair)?: None Help needed to walk in hospital room?: A Little Help needed climbing 3-5 steps with a railing? : A Little 6 Click Score: 22    End of Session   Activity Tolerance: Patient tolerated treatment well Patient left: in bed;with call bell/phone within reach Nurse Communication: Mobility status PT Visit Diagnosis: Other abnormalities of gait and mobility  (R26.89)     Time: 8588-8570 PT Time Calculation (min) (ACUTE ONLY): 18 min  Charges:    $Gait Training: 8-22 mins PT General Charges $$ ACUTE PT VISIT: 1 Visit                     04/15/2024  India HERO., PT Acute Rehabilitation Services [phone]  (office)   Vinie GAILS Jin Shockley 04/15/2024, 2:36 PM

## 2024-04-15 NOTE — Progress Notes (Signed)
 Mobility Specialist Progress Note;   04/15/24 0946  Mobility  Activity Ambulated with assistance;Pivoted/transferred from bed to chair  Level of Assistance Contact guard assist, steadying assist  Assistive Device None  Distance Ambulated (ft) 350 ft  Activity Response Tolerated well  Mobility Referral Yes  Mobility visit 1 Mobility  Mobility Specialist Start Time (ACUTE ONLY) 0946  Mobility Specialist Stop Time (ACUTE ONLY) 1000  Mobility Specialist Time Calculation (min) (ACUTE ONLY) 14 min   Pt eager for mobility. Required light MinG assistance for safety. VSS throughout and no c/o when asked. Requested to sit in chair at [facility]. Pt left in chair with all needs met, call bell in reach.   Lauraine Erm Mobility Specialist Please contact via SecureChat or [employer] [phone]

## 2024-04-16 ENCOUNTER — Other Ambulatory Visit (HOSPITAL_COMMUNITY): Payer: Self-pay

## 2024-04-16 DIAGNOSIS — D72829 Elevated white blood cell count, unspecified: Secondary | ICD-10-CM

## 2024-04-16 LAB — COMPREHENSIVE METABOLIC PANEL WITH GFR
ALT: 52 U/L — ABNORMAL HIGH (ref 0–44)
AST: 49 U/L — ABNORMAL HIGH (ref 15–41)
Albumin: 2 g/dL — ABNORMAL LOW (ref 3.5–5.0)
Alkaline Phosphatase: 83 U/L (ref 38–126)
Anion gap: 10 (ref 5–15)
BUN: 22 mg/dL — ABNORMAL HIGH (ref 6–20)
CO2: 21 mmol/L — ABNORMAL LOW (ref 22–32)
Calcium: 8.4 mg/dL — ABNORMAL LOW (ref 8.9–10.3)
Chloride: 110 mmol/L (ref 98–111)
Creatinine, Ser: 2.06 mg/dL — ABNORMAL HIGH (ref 0.44–1.00)
GFR, Estimated: 27 mL/min — ABNORMAL LOW (ref >60)
Glucose, Bld: 104 mg/dL — ABNORMAL HIGH (ref 70–99)
Potassium: 4.5 mmol/L (ref 3.5–5.1)
Sodium: 141 mmol/L (ref 135–145)
Total Bilirubin: 0.4 mg/dL (ref 0.0–1.2)
Total Protein: 6.3 g/dL — ABNORMAL LOW (ref 6.5–8.1)

## 2024-04-16 LAB — CBC WITH DIFFERENTIAL/PLATELET
Abs Immature Granulocytes: 2.02 K/uL — ABNORMAL HIGH (ref 0.00–0.07)
Basophils Absolute: 0.1 K/uL (ref 0.0–0.1)
Basophils Relative: 1 %
Eosinophils Absolute: 0.1 K/uL (ref 0.0–0.5)
Eosinophils Relative: 1 %
HCT: 23.9 % — ABNORMAL LOW (ref 36.0–46.0)
Hemoglobin: 7.4 g/dL — ABNORMAL LOW (ref 12.0–15.0)
Immature Granulocytes: 9 %
Lymphocytes Relative: 11 %
Lymphs Abs: 2.5 K/uL (ref 0.7–4.0)
MCH: 25.4 pg — ABNORMAL LOW (ref 26.0–34.0)
MCHC: 31 g/dL (ref 30.0–36.0)
MCV: 82.1 fL (ref 80.0–100.0)
Monocytes Absolute: 2.3 K/uL — ABNORMAL HIGH (ref 0.1–1.0)
Monocytes Relative: 10 %
Neutro Abs: 15.3 K/uL — ABNORMAL HIGH (ref 1.7–7.7)
Neutrophils Relative %: 68 %
Platelets: 436 K/uL — ABNORMAL HIGH (ref 150–400)
RBC: 2.91 MIL/uL — ABNORMAL LOW (ref 3.87–5.11)
RDW: 15.7 % — ABNORMAL HIGH (ref 11.5–15.5)
Smear Review: NORMAL
WBC: 22.4 K/uL — ABNORMAL HIGH (ref 4.0–10.5)
nRBC: 0 % (ref 0.0–0.2)

## 2024-04-16 LAB — PHOSPHORUS: Phosphorus: 5.3 mg/dL — ABNORMAL HIGH (ref 2.5–4.6)

## 2024-04-16 LAB — MAGNESIUM: Magnesium: 2 mg/dL (ref 1.7–2.4)

## 2024-04-16 MED ORDER — GABAPENTIN 300 MG PO CAPS: 300.0000 mg | ORAL_CAPSULE | Freq: Two times a day (BID) | ORAL | Status: DC

## 2024-04-16 MED ORDER — ACETAMINOPHEN 500 MG PO TABS: 500.0000 mg | ORAL_TABLET | Freq: Four times a day (QID) | ORAL | Status: AC | PRN

## 2024-04-16 MED ORDER — FERROUS SULFATE 325 (65 FE) MG PO TABS
325.0000 mg | ORAL_TABLET | ORAL | 1 refills | Status: AC
  Filled 2024-04-16: qty 15, 30d supply, fill #0

## 2024-04-16 MED ORDER — ACETAMINOPHEN 500 MG PO TABS: 1000.0000 mg | ORAL_TABLET | Freq: Three times a day (TID) | ORAL | Status: DC | PRN

## 2024-04-16 MED ORDER — AMOXICILLIN-POT CLAVULANATE 500-125 MG PO TABS
1.0000 | ORAL_TABLET | Freq: Two times a day (BID) | ORAL | 0 refills | Status: AC
  Filled 2024-04-16: qty 22, 11d supply, fill #0

## 2024-04-16 NOTE — Discharge Summary (Signed)
 Physician Discharge Summary  Theresa Rios FMW:[id number] DOB: 09-Sep-1966 DOA: 04/07/2024  PCP: Scarlett Ronal Caldron, NP  Admit date: 04/07/2024 Discharge date: 04/16/2024  Time spent: 40 minutes  Recommendations for Outpatient Follow-up:  Follow outpatient CBC/CMP  Follow AKI outpatient, trending towards improvement on day of discharge - consider additional workup if persistent Follow leukocytosis outpatient, trending towards improvement, but if persistent, would consider additional workup Encourage alcohol abstinence Follow fatty liver disease outpatient Inguinal hernias need outpatient surgical follow up    Discharge Diagnoses:  Principal Problem:   Leukocytosis Active Problems:   Acute respiratory failure with hypoxia (HCC)   Symptomatic anemia   Sepsis (HCC)   CAP (community acquired pneumonia)   AKI (acute kidney injury) (HCC)   Acute encephalopathy   Thrombocytopenia (HCC)   ESBL E. coli carrier   Poor dentition   Dental abscess   Seizures (HCC)   Discharge Condition: stable  Diet recommendation: heart healthy  Filed Weights   04/14/24 0415 04/15/24 0500 04/16/24 0500  Weight: 81.2 kg 81 kg 77.1 kg    History of present illness:   58 y.o. female with medical history significant of depression, anxiety, cocaine abuse, prior suicide attempt, epilepsy, expressive aphasia, hepatic encephalopathy, tobacco abuse, alcohol use disorder currently undergoing treatment at [facility] rehab.  She was sent to the ED yesterday from rehab for evaluation of slurred speech and confusion. Being treated for possible pneumonia and ESBL. E.coli UTI. ID consulted for worsening leukocytosis in the absence of fever. CT Abdomen showed pyelonephritis and Odontogram showed periapical abscess.    ID was consulted and recommended treatment of periapical abscess.  Plan for abx therapy with augmentin .    Stable for discharge on 8/8.    Hospital Course:  Assessment and Plan:  Sepsis  Possible UTI  and Pyelonephritis  Periapical Abscess  Perihilar Interstitial Opacities, possible Pneumonia Several possible sources  White count mildly improved, will trend Urine culture with ESBL e. Coli Blood cultures NGTD Negative RVP  SARS-CoV-2 PCR negative CT with findings concerning for pyelo - orthopantogram with periapical abscess - head CT with frothy secretions in L maxillary sinus ID recommending 2 weeks agumentin    ESBL. E.coli, asymptomatic:  Abx for UTI d/c'd with lack of specific symptoms   AKI Baseline creatinine 0.71, creatinine peaked at 2.61  CT without hydro Fena suggestive of prerenal cause 2.06 today, Improved with IVF, continue to monitor outpatient    Interstitial edema Volume Overload HFpEF  Currently appears euvolemic, recent chest CT without acute intrathoracic abnormality Follow outpatient   Acute hypoxemic respiratory failure,POA:  Likely multifactorial from pneumonia and acute CHF.  Resolved   Acute metabolic encephalopathy Resolved, in the setting of infection above? Head CT without acute intracranial abnormality  Delirium precautions   Microcytic anemia/iron deficiency anemia: 7.4 relatively stable Low iron, normal ferritin - normal b12 and folate Every other day iron  Follow outpatient    Mild thrombocytopenia resolved   Hyponatremia resolved   Elevated alkaline phosphatase Transaminases and T. bili normal.  F/u GGT (wnl).   Seizure disorder Continue Depakote    Alcohol use disorder No ongoing alcohol use as patient is currently undergoing treatment at [facility] rehab and per recent PCP note, she has been there for for the past 3 months.  Encourage continued abstinence   Possible Acute left maxillary sinusitis CT showing frothy secretions in the left maxillary sinus.  Does have significant leukocytosis on labs, however, patient denies any sinus pain/pressure or purulent nasal discharge. On abx as  above   Major moderate  Depression/anxiety Remeron       Procedures:  Echo IMPRESSIONS     1. Left ventricular ejection fraction, by estimation, is 60 to 65%. The  left ventricle has normal function. The left ventricle has no regional  wall motion abnormalities. Left ventricular diastolic parameters were  normal.   2. Right ventricular systolic function is normal. The right ventricular  size is normal.   3. Left atrial size was mildly dilated.   4. The mitral valve is normal in structure. Mild mitral valve  regurgitation. No evidence of mitral stenosis.   5. The aortic valve is normal in structure. Aortic valve regurgitation is  not visualized. No aortic stenosis is present.   6. The inferior vena cava is normal in size with greater than 50%  respiratory variability, suggesting right atrial pressure of 3 mmHg.   Conclusion(s)/Recommendation(s): Technically limited study due to poor  sound wave transmission.   Consultations: ID  Discharge Exam: Vitals:   04/15/24 2343 04/16/24 0454  BP: 102/63 (!) 103/57  Pulse: (!) 105 85  Resp: 16 18  Temp: 99.2 F (37.3 C) 98.6 F (37 C)  SpO2: 97% 98%   No complaints Eager to discharge Discussed importance of outpatient follow up  General: No acute distress. Lungs: unlabored Neurological: Alert and oriented 3. Moves all extremities 4 with equal strength. Cranial nerves II through XII grossly intact. Extremities: No clubbing or cyanosis. No edema.   Discharge Instructions   Discharge Instructions     Call MD for:  difficulty breathing, headache or visual disturbances   Complete by: As directed    Call MD for:  extreme fatigue   Complete by: As directed    Call MD for:  hives   Complete by: As directed    Call MD for:  persistant dizziness or light-headedness   Complete by: As directed    Call MD for:  persistant nausea and vomiting   Complete by: As directed    Call MD for:  redness, tenderness, or signs of infection (pain, swelling,  redness, odor or green/yellow discharge around incision site)   Complete by: As directed    Call MD for:  severe uncontrolled pain   Complete by: As directed    Call MD for:  temperature >100.4   Complete by: As directed    Diet - low sodium heart healthy   Complete by: As directed    Discharge instructions   Complete by: As directed    You were seen for confusion.  This was in the setting of infection.    Your white count is severely elevated (suggesting an infection).  We've now got you on augmentin  to treat your dental issue.  It's extremely important you follow up with the dentist as an outpatient to get this definitively managed (or else things could worsen again).  Your kidney function is not at its normal, but appears to be improving.  Avoid NSAIDs (ibuprofen , aleve , naproxen , mobic , etc).  You should follow up repeat labs with your PCP within Tsugio Elison week.  If things haven't further improved, you may need additional workup.  You have inguinal hernias that warrant outpatient surgery follow up.   Abstinence from alcohol remains extremely important.  You have evidence of fatty liver disease on imaging and should follow this with your outpatient doctor.  Abstinence is important to prevent progression of your liver disease.  Return for new, recurrent, or worsening symptoms.  Please ask your PCP to request  records from this hospitalization so they know what was done and what the next steps will be.   Increase activity slowly   Complete by: As directed       Allergies as of 04/16/2024       Reactions   Lactose Intolerance (gi) Other (See Comments)   Gas  Bloating         Medication List     STOP taking these medications    ibuprofen  200 MG tablet Commonly known as: ADVIL    naproxen  sodium 220 MG tablet Commonly known as: ALEVE        TAKE these medications    acetaminophen  500 MG tablet Commonly known as: TYLENOL  Take 2 tablets (1,000 mg total) by mouth every 8 (eight)  hours as needed (pain). What changed: when to take this   amoxicillin -clavulanate 500-125 MG tablet Commonly known as: AUGMENTIN  Take 1 tablet by mouth 2 (two) times daily for 11 days.   cholecalciferol  10 MCG (400 UNIT) Tabs tablet Commonly known as: VITAMIN D3 Take 1 tablet (400 Units total) by mouth daily.   diphenhydrAMINE  25 MG tablet Commonly known as: BENADRYL  Take 25 mg by mouth every 6 (six) hours as needed for allergies (mild anxiety).   divalproex  500 MG DR tablet Commonly known as: DEPAKOTE  Take 1 tablet (500 mg total) by mouth 2 (two) times daily.   ferrous sulfate  325 (65 FE) MG tablet Take 1 tablet (325 mg total) by mouth every other day. Follow with your PCP outpatient to follow up your iron def and anemia.   folic acid  1 MG tablet Commonly known as: FOLVITE  Take 1 tablet (1 mg total) by mouth daily.   gabapentin  300 MG capsule Commonly known as: NEURONTIN  Take 1 capsule (300 mg total) by mouth 2 (two) times daily. This medicine may need to be adjusted based on your kidney function - follow with your PCP outpatient What changed:  when to take this additional instructions   guaiFENesin  600 MG 12 hr tablet Commonly known as: MUCINEX  Take 600 mg by mouth every 12 (twelve) hours as needed for cough (chest congestion).   hydrocortisone cream 1 % Apply 1 Application topically 4 (four) times daily as needed for itching (minor rash).   melatonin 5 MG Tabs Take 1 tablet (5 mg total) by mouth at bedtime. What changed:  when to take this reasons to take this   mirtazapine  7.5 MG tablet Commonly known as: REMERON  Take 1 tablet (7.5 mg total) by mouth at bedtime.   multivitamin with minerals Tabs tablet Take 1 tablet by mouth daily.   nicotine  21 mg/24hr patch Commonly known as: NICODERM CQ  - dosed in mg/24 hours Place 21 mg onto the skin daily as needed (smoking cessation).   simethicone  125 MG chewable tablet Commonly known as: MYLICON Chew 125 mg by  mouth daily as needed (gas, bloating).   thiamine  100 MG tablet Commonly known as: VITAMIN B1 Take 1 tablet (100 mg total) by mouth daily.       Allergies  Allergen Reactions   Lactose Intolerance (Gi) Other (See Comments)    Gas  Bloating     Follow-up Information     [facility]. Go in 7 day(s).   Specialty: Cardiology Why: Hospital follow up 04/19/2024 @ 9:45 am  Please bring Shaquel Josephson current medication list to appointment Free valet parking, Entrance C, off [employer]  Look for [employer] and [facility] entrance [employer] information: [address] [place]  [place]  [address] [phone]        [facility] Follow up on 04/21/2024.   Why: @ 1:00 pm for dental appointment- Please bring insurance cared and ID. Co pay will be $4.00 for each visit. If you cannot make this scheduled visit please call the office to reschedule. Contact information: [address] [place] [address] [phone]         Scarlett Ronal Caldron, NP Follow up.   Contact information: [address]  [place] [address] [phone]                  The results of significant diagnostics from this hospitalization (including imaging, microbiology, ancillary and laboratory) are listed below for reference.    Significant Diagnostic Studies: CT CHEST ABDOMEN PELVIS WO CONTRAST Result Date: 04/13/2024 CLINICAL DATA:  Fever short of breath EXAM: CT CHEST, ABDOMEN AND PELVIS WITHOUT CONTRAST TECHNIQUE: Multidetector CT imaging of the chest, abdomen and pelvis was performed following the standard protocol without IV contrast. RADIATION DOSE REDUCTION: This exam was performed according to the departmental dose-optimization program which includes automated exposure control, adjustment of the mA and/or kV according to patient size and/or use of iterative reconstruction technique. COMPARISON:  CT 05/06/2022, chest x-ray  04/11/2024 FINDINGS: CT CHEST FINDINGS Cardiovascular: Limited evaluation without intravenous contrast. Mild atherosclerosis. No aneurysm. Mild cardiomegaly. No pericardial effusion Mediastinum/Nodes: Patent trachea. No thyroid  mass. No suspicious lymph nodes. Esophagus within normal limits. Lungs/Pleura: No acute airspace disease, pleural effusion, or pneumothorax Musculoskeletal: The liver is enlarged measuring 21 cm. Diffuse decreased density consistent with steatosis. No calcified gallstone or biliary dilatation CT ABDOMEN PELVIS FINDINGS Hepatobiliary: No focal liver abnormality is seen. No gallstones, gallbladder wall thickening, or biliary dilatation. Pancreas: Unremarkable. No pancreatic ductal dilatation or surrounding inflammatory changes. Spleen: Negative Adrenals/Urinary Tract: Thickened adrenals without dominant mass. Edematous appearing kidneys with prominent perinephric fat stranding. No hydronephrosis or obstructing stone. Slightly thick walled appearance of the bladder Stomach/Bowel: Stomach is nonenlarged. No dilated small bowel. Negative appendix. No acute bowel wall thickening Vascular/Lymphatic: Aortic atherosclerosis. No enlarged abdominal or pelvic lymph nodes. Reproductive: Uterus and bilateral adnexa are unremarkable. Other: Small volume free fluid in the pelvis. No free air. Fat containing left greater than right inguinal hernias. Soft tissue stranding and small volume fluid within the left inguinal hernia. Musculoskeletal: No acute osseous abnormality IMPRESSION: 1. No CT evidence for acute intrathoracic abnormality.  Cardiomegaly 2. Edematous appearing kidneys with prominent perinephric fat stranding. Slightly thick-walled appearance of the bladder. Imaging appearance suggest possible UTI and pyelonephritis. 3. Hepatomegaly with hepatic steatosis. 4. Fat containing left greater than right inguinal hernias. Soft tissue stranding and small volume fluid within the left inguinal hernia,  cannot exclude incarcerated fat containing left inguinal hernia. 5. Aortic atherosclerosis. Aortic Atherosclerosis (ICD10-I70.0). Electronically Signed   By: Luke Bun M.D.   On: 04/13/2024 01:41   DG Orthopantogram Result Date: 04/12/2024 CLINICAL DATA:  [id number] Poor dentition requiring referral to dentistry [id number] EXAM: ORTHOPANTOGRAM/PANORAMIC COMPARISON:  None Available. FINDINGS: Patient missing multiple upper and lower teeth. Remaining teeth demonstrate numerous dental caries. Lucency around the remaining left lower premolar concerning for periapical abscess. No mandibular fracture. Visualized paranasal sinuses clear. IMPRESSION: Numerous dental caries in remaining teeth. Concern for periapical abscess around the remaining left lower premolar. Electronically Signed   By: Franky Crease M.D.   On: 04/12/2024 12:56   DG CHEST PORT 1 VIEW Result Date: 04/11/2024 CLINICAL DATA:  Dyspnea. EXAM: PORTABLE CHEST  1 VIEW COMPARISON:  04/07/2024 FINDINGS: Lungs are hypoinflated demonstrate hazy prominence of the central pulmonary vessels compatible with mild interstitial edema. No significant effusion. Stable cardiomegaly. Remainder of the exam is unchanged. IMPRESSION: Cardiomegaly with findings suggesting mild interstitial edema. Electronically Signed   By: Toribio Agreste M.D.   On: 04/11/2024 09:52   ECHOCARDIOGRAM COMPLETE Result Date: 04/08/2024    ECHOCARDIOGRAM REPORT   Patient Name:   Theresa Rios Date of Exam: 04/08/2024 Medical Rec #:  [id number]      Height:       62.0 in Accession #:    [id number]     Weight:       192.9 lb Date of Birth:  01/12/1966     BSA:          1.883 m Patient Age:    57 years       BP:           111/75 mmHg Patient Gender: F              HR:           100 bpm. Exam Location:  Inpatient Procedure: 2D Echo, Cardiac Doppler and Color Doppler (Both Spectral and Color            Flow Doppler were utilized during procedure). Indications:    CHF  History:        Patient has no  prior history of Echocardiogram examinations.  Sonographer:    Carmelita Hartshorn RDCS, FE, PE Referring Phys: [id number] VASUNDHRA RATHORE IMPRESSIONS  1. Left ventricular ejection fraction, by estimation, is 60 to 65%. The left ventricle has normal function. The left ventricle has no regional wall motion abnormalities. Left ventricular diastolic parameters were normal.  2. Right ventricular systolic function is normal. The right ventricular size is normal.  3. Left atrial size was mildly dilated.  4. The mitral valve is normal in structure. Mild mitral valve regurgitation. No evidence of mitral stenosis.  5. The aortic valve is normal in structure. Aortic valve regurgitation is not visualized. No aortic stenosis is present.  6. The inferior vena cava is normal in size with greater than 50% respiratory variability, suggesting right atrial pressure of 3 mmHg. Conclusion(s)/Recommendation(s): Technically limited study due to poor sound wave transmission. FINDINGS  Left Ventricle: Left ventricular ejection fraction, by estimation, is 60 to 65%. The left ventricle has normal function. The left ventricle has no regional wall motion abnormalities. The left ventricular internal cavity size was normal in size. There is  no left ventricular hypertrophy. Left ventricular diastolic parameters were normal. Right Ventricle: The right ventricular size is normal. No increase in right ventricular wall thickness. Right ventricular systolic function is normal. Left Atrium: Left atrial size was mildly dilated. Right Atrium: Right atrial size was normal in size. Pericardium: There is no evidence of pericardial effusion. Mitral Valve: The mitral valve is normal in structure. Mild mitral valve regurgitation. No evidence of mitral valve stenosis. Tricuspid Valve: The tricuspid valve is normal in structure. Tricuspid valve regurgitation is mild . No evidence of tricuspid stenosis. Aortic Valve: The aortic valve is normal in structure. Aortic  valve regurgitation is not visualized. No aortic stenosis is present. Aortic valve mean gradient measures 7.0 mmHg. Aortic valve peak gradient measures 12.4 mmHg. Aortic valve area, by VTI measures 2.54 cm. Pulmonic Valve: The pulmonic valve was normal in structure. Pulmonic valve regurgitation is trivial. No evidence of pulmonic stenosis. Aorta: The aortic root is normal in size and  structure. Venous: The inferior vena cava is normal in size with greater than 50% respiratory variability, suggesting right atrial pressure of 3 mmHg. IAS/Shunts: No atrial level shunt detected by color flow Doppler.  LEFT VENTRICLE PLAX 2D LVIDd:         5.00 cm   Diastology LVIDs:         3.50 cm   LV e' medial:    9.36 cm/s LV PW:         1.00 cm   LV E/e' medial:  13.7 LV IVS:        0.90 cm   LV e' lateral:   11.40 cm/s LVOT diam:     2.00 cm   LV E/e' lateral: 11.2 LV SV:         68 LV SV Index:   36 LVOT Area:     3.14 cm  RIGHT VENTRICLE RV S prime:     10.00 cm/s TAPSE (M-mode): 1.8 cm LEFT ATRIUM             Index        RIGHT ATRIUM           Index LA diam:        4.40 cm 2.34 cm/m   RA Area:     19.10 cm LA Vol (A2C):   58.4 ml 31.02 ml/m  RA Volume:   47.70 ml  25.34 ml/m LA Vol (A4C):   73.1 ml 38.83 ml/m LA Biplane Vol: 66.6 ml 35.38 ml/m  AORTIC VALVE AV Area (Vmax):    2.37 cm AV Area (Vmean):   2.40 cm AV Area (VTI):     2.54 cm AV Vmax:           176.00 cm/s AV Vmean:          118.000 cm/s AV VTI:            0.270 m AV Peak Grad:      12.4 mmHg AV Mean Grad:      7.0 mmHg LVOT Vmax:         133.00 cm/s LVOT Vmean:        90.100 cm/s LVOT VTI:          0.218 m LVOT/AV VTI ratio: 0.81  AORTA Ao Root diam: 2.50 cm MITRAL VALVE MV Area (PHT): 5.02 cm     SHUNTS MV Decel Time: 151 msec     Systemic VTI:  0.22 m MV E velocity: 128.00 cm/s  Systemic Diam: 2.00 cm MV Vila Dory velocity: 92.20 cm/s MV E/Sehaj Kolden ratio:  1.39 Toribio Fuel MD Electronically signed by Toribio Fuel MD Signature Date/Time: 04/08/2024/6:27:43 PM     Final    US  RENAL Result Date: 04/08/2024 CLINICAL DATA:  Acute renal insufficiency. EXAM: RENAL / URINARY TRACT ULTRASOUND COMPLETE COMPARISON:  CT abdomen pelvis dated 05/06/2022. FINDINGS: Right Kidney: Renal measurements: 10.6 x 5.0 x 5.8 cm = volume: 163 mL. Echogenicity within normal limits. No mass or hydronephrosis visualized. Left Kidney: Renal measurements: 11.1 x 6.6 x 7.9 cm = volume: 305 mL. Echogenicity within normal limits. No mass or hydronephrosis visualized. Bladder: Appears normal for degree of bladder distention. Other: None. IMPRESSION: Unremarkable renal ultrasound. Electronically Signed   By: Vanetta Chou M.D.   On: 04/08/2024 11:48   CT Head Wo Contrast Result Date: 04/07/2024 CLINICAL DATA:  Mental status change, unknown cause EXAM: CT HEAD WITHOUT CONTRAST TECHNIQUE: Contiguous axial images were obtained from the base of the skull through the vertex  without intravenous contrast. RADIATION DOSE REDUCTION: This exam was performed according to the departmental dose-optimization program which includes automated exposure control, adjustment of the mA and/or kV according to patient size and/or use of iterative reconstruction technique. COMPARISON:  Multiple, most recently August 30, 2023 FINDINGS: Brain: The ventricles appear age appropriate. No mass effect or midline shift. Gray-white differentiation is preserved without focal attenuation abnormality.No evidence of acute territorial infarction, extra-axial fluid collection, hemorrhage, or mass lesion. The basilar cisterns are patent without downward herniation. The cerebellar hemispheres and vermis are well formed without mass lesion or focal attenuation abnormality. Vascular: No hyperdense vessel. Skull: Normal. Negative for fracture or focal lesion. Chronically depressed fracture of the right lamina papyracea. Chronic, healed fracture deformity of the left zygomatic arch. Sinuses/Orbits: Frothy secretions in the left maxillary  sinus. Elma Shands few air-fluid levels present in the ethmoid sinuses. The mastoids are clear.The globes appear intact. No retrobulbar hematoma. Other: None. IMPRESSION: 1. No acute intracranial abnormality, specifically, no acute hemorrhage, territorial infarction, or intracranial mass. 2. Frothy secretions in the left maxillary sinus. Correlation for possible acute sinusitis recommended. Electronically Signed   By: Rogelia Myers M.D.   On: 04/07/2024 16:51   DG Chest 1 View Result Date: 04/07/2024 CLINICAL DATA:  sob EXAM: CHEST  1 VIEW COMPARISON:  July 08, 2023 FINDINGS: Lower lung volumes. Elevation of the right hemidiaphragm. Interstitial opacities throughout both lungs. No lobar consolidation or pneumothorax. Blunting of both costophrenic sulci. Moderate cardiomegaly. Tortuous aorta. No acute fracture or destructive lesions. Multilevel thoracic osteophytosis. IMPRESSION: 1. Bilateral perihilar interstitial opacities, likely reflecting bronchovascular crowding due to low lung volumes. Alternatively, atypical/viral infection or interstitial edema could have this appearance, in the correct clinical context. 2. Findings suggestive of trace bilateral pleural effusions. Electronically Signed   By: Rogelia Myers M.D.   On: 04/07/2024 16:07    Microbiology: Recent Results (from the past 240 hours)  SARS Coronavirus 2 by RT PCR (hospital order, performed in [facility] hospital lab) *cepheid single result test* Anterior Nasal Swab     Status: None   Collection Time: 04/07/24  3:49 PM   Specimen: Anterior Nasal Swab  Result Value Ref Range Status   SARS Coronavirus 2 by RT PCR NEGATIVE NEGATIVE Final    Comment: (NOTE) SARS-CoV-2 target nucleic acids are NOT DETECTED.  The SARS-CoV-2 RNA is generally detectable in upper and lower respiratory specimens during the acute phase of infection. The lowest concentration of SARS-CoV-2 viral copies this assay can detect is 250 copies / mL. Elenor Wildes negative result does  not preclude SARS-CoV-2 infection and should not be used as the sole basis for treatment or other patient management decisions.  Tamecca Artiga negative result may occur with improper specimen collection / handling, submission of specimen other than nasopharyngeal swab, presence of viral mutation(s) within the areas targeted by this assay, and inadequate number of viral copies (<250 copies / mL). Caitlyn Buchanan negative result must be combined with clinical observations, patient history, and epidemiological information.  Fact Sheet for Patients:   RoadLapTop.co.za  Fact Sheet for Healthcare Providers: http://kim-miller.com/  This test is not yet approved or  cleared by the [place]  FDA and has been authorized for detection and/or diagnosis of SARS-CoV-2 by FDA under an Emergency Use Authorization (EUA).  This EUA will remain in effect (meaning this test can be used) for the duration of the COVID-19 declaration under Section 564(b)(1) of the Act, 21 U.S.C. section 360bbb-3(b)(1), unless the authorization is terminated or revoked sooner.  Performed at  [facility], [address] [place], [place] [address]   Urine Culture (for pregnant, neutropenic or urologic patients or patients with an indwelling urinary catheter)     Status: Abnormal   Collection Time: 04/08/24  6:22 AM   Specimen: Urine, Clean Catch  Result Value Ref Range Status   Specimen Description URINE, CLEAN CATCH  Final   Special Requests   Final    NONE Performed at [facility], [address] [place], [place] [address]    Culture (Lety Cullens)  Final    50,000 COLONIES/mL ESCHERICHIA COLI Confirmed Extended Spectrum Beta-Lactamase Producer (ESBL).  In bloodstream infections from ESBL organisms, carbapenems are preferred over piperacillin/tazobactam. They are shown to have Tomiko Schoon lower risk of mortality.    Report Status 04/10/2024 FINAL  Final   Organism ID, Bacteria ESCHERICHIA COLI  (Kien Mirsky)  Final      Susceptibility   Escherichia coli - MIC*    AMPICILLIN  >=32 RESISTANT Resistant     CEFAZOLIN >=64 RESISTANT Resistant     CEFEPIME 16 RESISTANT Resistant     CEFTRIAXONE  >=64 RESISTANT Resistant     CIPROFLOXACIN <=0.25 SENSITIVE Sensitive     GENTAMICIN >=16 RESISTANT Resistant     IMIPENEM <=0.25 SENSITIVE Sensitive     NITROFURANTOIN  <=16 SENSITIVE Sensitive     TRIMETH/SULFA >=320 RESISTANT Resistant     AMPICILLIN /SULBACTAM >=32 RESISTANT Resistant     PIP/TAZO <=4 SENSITIVE Sensitive ug/mL    * 50,000 COLONIES/mL ESCHERICHIA COLI  Respiratory (~20 pathogens) panel by PCR     Status: None   Collection Time: 04/08/24  7:00 AM   Specimen: Nasopharyngeal Swab; Respiratory  Result Value Ref Range Status   Adenovirus NOT DETECTED NOT DETECTED Final   Coronavirus 229E NOT DETECTED NOT DETECTED Final    Comment: (NOTE) The Coronavirus on the Respiratory Panel, DOES NOT test for the novel  Coronavirus (2019 nCoV)    Coronavirus HKU1 NOT DETECTED NOT DETECTED Final   Coronavirus NL63 NOT DETECTED NOT DETECTED Final   Coronavirus OC43 NOT DETECTED NOT DETECTED Final   Metapneumovirus NOT DETECTED NOT DETECTED Final   Rhinovirus / Enterovirus NOT DETECTED NOT DETECTED Final   Influenza Bandy Honaker NOT DETECTED NOT DETECTED Final   Influenza B NOT DETECTED NOT DETECTED Final   Parainfluenza Virus 1 NOT DETECTED NOT DETECTED Final   Parainfluenza Virus 2 NOT DETECTED NOT DETECTED Final   Parainfluenza Virus 3 NOT DETECTED NOT DETECTED Final   Parainfluenza Virus 4 NOT DETECTED NOT DETECTED Final   Respiratory Syncytial Virus NOT DETECTED NOT DETECTED Final   Bordetella pertussis NOT DETECTED NOT DETECTED Final   Bordetella Parapertussis NOT DETECTED NOT DETECTED Final   Chlamydophila pneumoniae NOT DETECTED NOT DETECTED Final   Mycoplasma pneumoniae NOT DETECTED NOT DETECTED Final    Comment: Performed at [facility], [address], [place], [place] [address]   Culture, blood (routine x 2) Call MD if unable to obtain prior to antibiotics being given     Status: None   Collection Time: 04/08/24 11:31 AM   Specimen: BLOOD  Result Value Ref Range Status   Specimen Description BLOOD SITE NOT SPECIFIED  Final   Special Requests   Final    BOTTLES DRAWN AEROBIC AND ANAEROBIC Blood Culture adequate volume   Culture   Final    NO GROWTH 5 DAYS Performed at [facility], [address], [place], [place] [address]    Report Status 04/13/2024 FINAL  Final  Culture, blood (routine x 2) Call MD if unable to obtain prior to antibiotics being given     Status: None   Collection Time: 04/08/24 11:53 AM   Specimen: BLOOD  Result Value Ref Range Status   Specimen Description BLOOD SITE NOT SPECIFIED  Final   Special Requests   Final    BOTTLES DRAWN AEROBIC AND ANAEROBIC Blood Culture adequate volume   Culture   Final    NO GROWTH 5 DAYS Performed at [facility], [address] [place], [place] [address]    Report Status 04/13/2024 FINAL  Final  MRSA Next Gen by PCR, Nasal     Status: None   Collection Time: 04/08/24 11:45 PM   Specimen: Nasal Mucosa; Nasal Swab  Result Value Ref Range Status   MRSA by PCR Next Gen NOT DETECTED NOT DETECTED Final    Comment: (NOTE) The GeneXpert MRSA Assay (FDA approved for NASAL specimens only), is one component of Baraka Klatt comprehensive MRSA colonization surveillance program. It is not intended to diagnose MRSA infection nor to guide or monitor treatment for MRSA infections. Test performance is not FDA approved in patients less than 32 years old. Performed at [facility], [address] [place], [place] [address]      Labs: Basic Metabolic Panel: Recent Labs  Lab 04/11/24 0406 04/12/24 1005 04/13/24 0556 04/14/24 0509 04/15/24 0527 04/16/24 0451  NA 137 140 139 138 139 141  K 4.1 4.8 4.9 4.7 4.7 4.5  CL 107 108 109 108 109 110  CO2 19* 21* 21* 20* 21* 21*  GLUCOSE 120* 203* 111* 153*  105* 104*  BUN 44* 37* 33* 30* 27* 22*  CREATININE 2.39* 2.47* 2.34* 2.34* 2.20* 2.06*  CALCIUM  8.1* 8.1* 8.1* 8.4* 8.5* 8.4*  MG 2.6*  --   --   --  2.2 2.0  PHOS  --   --   --   --  5.6* 5.3*   Liver Function Tests: Recent Labs  Lab 04/10/24 0928 04/15/24 0527 04/16/24 0451  AST 14* 62* 49*  ALT 11 51* 52*  ALKPHOS 157* 109 83  BILITOT 0.7 0.6 0.4  PROT 5.8* 6.8 6.3*  ALBUMIN 1.9* 2.1* 2.0*   No results for input(s): LIPASE, AMYLASE in the last 168 hours. No results for input(s): AMMONIA in the last 168 hours. CBC: Recent Labs  Lab 04/12/24 0508 04/13/24 0556 04/14/24 0509 04/15/24 0527 04/16/24 0451  WBC 36.4* 36.3* 34.9* 29.6* 22.4*  NEUTROABS 19.3* 30.5* 30.7* 20.3* 15.3*  HGB 7.8* 7.6* 7.7* 7.9* 7.4*  HCT 23.8* 23.4* 23.8* 24.7* 23.9*  MCV 79.9* 80.4 81.5 80.7 82.1  PLT 250 312 366 438* 436*   Cardiac Enzymes: No results for input(s): CKTOTAL, CKMB, CKMBINDEX, TROPONINI in the last 168 hours. BNP: BNP (last 3 results) No results for input(s): BNP in the last 8760 hours.  ProBNP (last 3 results) Recent Labs    04/07/24 1539  PROBNP 5,136.0*    CBG: No results for input(s): GLUCAP in the last 168 hours.     Signed:  Meliton Monte MD.  [facility] 04/16/2024, 7:49 AM

## 2024-04-16 NOTE — TOC Transition Note (Signed)
 Transition of Care [facility]) - Discharge Note   Patient Details  Name: Theresa Rios MRN: [id number] Date of Birth: September 20, 1965  [facility]) CM/SW Contact:  Sudie Erminio Deems, RN Phone Number: 04/16/2024, 10:19 AM   Clinical Narrative: Patient will transition home today. PCP and Dentist appointment has been established. CSW has provided bus passes for dentist and PCP appointments. No further needs identified at this time.   Final next level of care: Home/Self Care Barriers to Discharge: No Barriers Identified   Patient Goals and CMS Choice Patient states their goals for this hospitalization and ongoing recovery are:: Patient states she graduates from [employer] on Friday and then will be staying in [place]   Choice offered to / list presented to : NA   Discharge Plan and Services Additional resources added to the After Visit Summary for   In-house Referral: Clinical Social Work Discharge Planning Services: CM Consult, Follow-up appt scheduled, Other - See comment (Dentist appointment scheduled.) Post Acute Care Choice: NA            DME Agency: NA       HH Arranged: NA  Social Drivers of Health (SDOH) Interventions SDOH Screenings   Food Insecurity: Food Insecurity Present (04/08/2024)  Housing: Unknown (04/12/2024)  Transportation Needs: Unmet Transportation Needs (04/12/2024)  Utilities: At Risk (04/08/2024)  Alcohol Screen: High Risk (01/08/2024)  Depression (PHQ2-9): Low Risk  (01/15/2024)  Recent Concern: Depression (PHQ2-9) - High Risk (01/12/2024)  Financial Resource Strain: Low Risk  (04/12/2024)  Tobacco Use: High Risk (04/08/2024)    Readmission Risk Interventions     No data to display

## 2024-04-16 NOTE — Progress Notes (Signed)
 CSW received consult from RN patient requesting clothes. CSW provided patient with t-shirt,sweatshirt,and socks from social work closest. All questions answered. No further questions reported at this time.

## 2024-05-04 ENCOUNTER — Ambulatory Visit (INDEPENDENT_AMBULATORY_CARE_PROVIDER_SITE_OTHER): Payer: MEDICAID | Admitting: Primary Care

## 2024-05-04 ENCOUNTER — Encounter (INDEPENDENT_AMBULATORY_CARE_PROVIDER_SITE_OTHER): Payer: Self-pay | Admitting: Primary Care

## 2024-05-04 VITALS — BP 100/68 | HR 85 | Temp 98.6°F | Resp 16 | Ht 62.0 in | Wt 159.0 lb

## 2024-05-04 DIAGNOSIS — Z09 Encounter for follow-up examination after completed treatment for conditions other than malignant neoplasm: Secondary | ICD-10-CM

## 2024-05-04 NOTE — Progress Notes (Signed)
 Subjective:   Theresa Rios is a 58 y.o. female presents for hospital follow up and establish care. Admit date to the hospital was 04/07/24, patient was discharged from the hospital on 04/16/24, patient was admitted for:   Acute respiratory failure with hypoxia (HCC) Symptomatic anemia,Sepsis (HCC),CAP (community acquired pneumonia)AKI (acute kidney injury) (HCC),Acute encephalopathy,Thrombocytopenia (HCC), ESBL E. coli carrier, Poor dentition,Dental abscess and seizures.  Patient did not go to [facility] scheduled for 04/19/2024.  Patient will continue to follow with her primary care Maring and Placey at there are [facility].  She was not aware this appointment was hospitalization follow-up and establish care.  Advised her to call primary care for an appointment as soon as possible  Past Medical History:  Diagnosis Date   Anxiety state 03/21/2022   Cocaine use disorder, severe, dependence (HCC) 01/21/2023   Epilepsy, unspecified, intractable, without status epilepticus (HCC) 01/10/2024   Expressive aphasia 01/10/2024   Hepatic encephalopathy (HCC) 01/11/2024   Nicotine  use disorder 01/10/2024   Seizures (HCC)    Suicide attempt by drug ingestion (HCC) 03/19/2022     Allergies  Allergen Reactions   Lactose Intolerance (Gi) Other (See Comments)    Gas  Bloating     Current Outpatient Medications on File Prior to Visit  Medication Sig Dispense Refill   acetaminophen  (TYLENOL ) 500 MG tablet Take 1 tablet (500 mg total) by mouth every 6 (six) hours as needed (pain).     cholecalciferol  (VITAMIN D3) 10 MCG (400 UNIT) TABS tablet Take 1 tablet (400 Units total) by mouth daily. 30 tablet 0   diphenhydrAMINE  (BENADRYL ) 25 MG tablet Take 25 mg by mouth every 6 (six) hours as needed for allergies (mild anxiety).     divalproex  (DEPAKOTE ) 500 MG DR tablet Take 1 tablet (500 mg total) by mouth 2 (two) times daily. 60 tablet 1   ferrous sulfate  325 (65 FE) MG tablet Take 1 tablet (325 mg total) by mouth  every other day. Follow with your PCP outpatient to follow up your iron def and anemia. 15 tablet 1   folic acid  (FOLVITE ) 1 MG tablet Take 1 tablet (1 mg total) by mouth daily. 30 tablet 0   gabapentin  (NEURONTIN ) 300 MG capsule Take 1 capsule (300 mg total) by mouth 2 (two) times daily. This medicine may need to be adjusted based on your kidney function - follow with your PCP outpatient     guaiFENesin  (MUCINEX ) 600 MG 12 hr tablet Take 600 mg by mouth every 12 (twelve) hours as needed for cough (chest congestion).     hydrocortisone cream 1 % Apply 1 Application topically 4 (four) times daily as needed for itching (minor rash).     melatonin 5 MG TABS Take 1 tablet (5 mg total) by mouth at bedtime. (Patient taking differently: Take 5 mg by mouth at bedtime as needed (insomnia).) 30 tablet 0   mirtazapine  (REMERON ) 7.5 MG tablet Take 1 tablet (7.5 mg total) by mouth at bedtime. 30 tablet 0   Multiple Vitamin (MULTIVITAMIN WITH MINERALS) TABS tablet Take 1 tablet by mouth daily. 30 tablet 0   nicotine  (NICODERM CQ  - DOSED IN MG/24 HOURS) 21 mg/24hr patch Place 21 mg onto the skin daily as needed (smoking cessation).     simethicone  (MYLICON) 125 MG chewable tablet Chew 125 mg by mouth daily as needed (gas, bloating).     thiamine  (VITAMIN B1) 100 MG tablet Take 1 tablet (100 mg total) by mouth daily. 30 tablet 0  No current facility-administered medications on file prior to visit.    Review of System: ROS Comprehensive ROS Pertinent positive and negative noted in HPI   Objective:  BP 100/68 (BP Location: Left Arm, Patient Position: Sitting, Cuff Size: Small)   Pulse 85   Temp 98.6 F (37 C) (Oral)   Resp 16   Ht 5' 2 (1.575 m)   Wt 159 lb (72.1 kg)   LMP 05/17/2014   SpO2 97%   BMI 29.08 kg/m   Filed Weights   05/04/24 0910  Weight: 159 lb (72.1 kg)    Physical Exam Vitals reviewed.  Constitutional:      Appearance: Normal appearance.  HENT:     Head: Normocephalic.      Right Ear: External ear normal.     Left Ear: External ear normal.     Nose: Nose normal.     Mouth/Throat:     Mouth: Mucous membranes are moist.  Eyes:     Extraocular Movements: Extraocular movements intact.  Cardiovascular:     Rate and Rhythm: Normal rate.  Pulmonary:     Effort: Pulmonary effort is normal.     Breath sounds: Normal breath sounds.  Abdominal:     General: Bowel sounds are normal.     Palpations: Abdomen is soft.  Musculoskeletal:        General: Normal range of motion.     Cervical back: Normal range of motion.  Skin:    General: Skin is warm and dry.  Neurological:     Mental Status: She is alert and oriented to person, place, and time.  Psychiatric:        Mood and Affect: Mood normal.        Behavior: Behavior normal.        Thought Content: Thought content normal.      Assess  Diagnoses and all orders for this visit:  Hospital discharge follow-up Hospital discharge will be with primary care and she will continue her care [facility] care at [facility]    This note has been created with Dragon speech recognition software and smart phrase technology. Any transcriptional errors are unintentional.   No follow-ups on file.  Theresa SHAUNNA Bohr, NP 05/04/2024, 9:54 AM

## 2024-07-01 ENCOUNTER — Telehealth: Payer: MEDICAID | Admitting: Nurse Practitioner

## 2024-07-01 DIAGNOSIS — M545 Low back pain, unspecified: Secondary | ICD-10-CM

## 2024-07-01 DIAGNOSIS — G8929 Other chronic pain: Secondary | ICD-10-CM

## 2024-07-01 MED ORDER — GABAPENTIN 300 MG PO CAPS: 300.0000 mg | ORAL_CAPSULE | Freq: Three times a day (TID) | ORAL | 0 refills | Status: DC | PRN

## 2024-07-01 MED ORDER — IBUPROFEN 800 MG PO TABS: 800.0000 mg | ORAL_TABLET | Freq: Three times a day (TID) | ORAL | 0 refills | Status: DC | PRN

## 2024-07-01 MED ORDER — CYCLOBENZAPRINE HCL 10 MG PO TABS: 10.0000 mg | ORAL_TABLET | Freq: Every day | ORAL | 0 refills | Status: AC

## 2024-07-01 NOTE — Progress Notes (Signed)
 Acute Video Visit    Virtual Visit Consent:   Theresa Rios, you are scheduled for a virtual visit with a [facility] provider today.     Just as with appointments in the office, your consent must be obtained to participate.  Your consent will be active for this visit and any virtual visit you may have with one of our providers in the next 365 days.     If you have a MyChart account, a copy of this consent can be sent to you electronically.  All virtual visits are billed to your insurance company just like a traditional visit in the office.    If the connection with a video visit is poor, the visit may have to be switched to a telephone visit.  With either a video or telephone visit, we are not always able to ensure that we have a secure connection.     I need to obtain your verbal consent now.   Are you willing to proceed with your visit today?    Theresa Rios has provided verbal consent on 07/01/2024 for a virtual visit (video or telephone).   Theresa Kitty, FNP  Date: 07/01/2024 10:37 AM  Subjective:     Patient ID: Theresa Rios, female    DOB: 16-Mar-1966, 58 y.o.   MRN: [id number]  ILauraine Rios, connected with  Theresa Rios  ([id number], March 09, 1966) on 07/01/24 at 12:15 PM EDT by a video-enabled telemedicine application and verified that I am speaking with the correct person using two identifiers.   Location: Patient: [facility]  Provider: Virtual Visit Location Provider: Home Office   I discussed the limitations of evaluation and management by telemedicine and the availability of in person appointments. The patient expressed understanding and agreed to proceed.     HPI  Theresa Rios is a 58 y.o. who identifies as a female who was assigned female at birth, and is being seen today for pain in her lower back that radiates into her right buttock.  This is a chronic recurrent pain that she has dealt with for years.   She has been receiving her medical care at the [facility] for  the past years/ she is currently homeless. She also received SDOH needs at the [facility]. Her PCP is Theresa Rios and she is awaiting an appointment next month for follow up with her        Objective:      Physical Exam Constitutional:      General: She is not in acute distress.    Appearance: Normal appearance. She is not ill-appearing.  HENT:     Nose: Nose normal.     Mouth/Throat:     Mouth: Mucous membranes are moist.  Pulmonary:     Effort: Pulmonary effort is normal.  Musculoskeletal:       Legs:  Neurological:     Mental Status: She is alert and oriented to person, place, and time.  Psychiatric:        Mood and Affect: Mood normal.           Assessment & Plan:   Encouraged to use [facility] for any acute needs while awaiting PCP appointment next month    Chronic right-sided low back pain without sciatica (Primary)  - gabapentin  (NEURONTIN ) 300 MG capsule; Take 1 capsule (300 mg total) by mouth 3 (three) times daily as needed.  Dispense: 90 capsule; Refill: 0 - cyclobenzaprine  (FLEXERIL ) 10 MG tablet; Take 1 tablet (10  mg total) by mouth at bedtime.  Dispense: 30 tablet; Refill: 0 - ibuprofen  (ADVIL ) 800 MG tablet; Take 1 tablet (800 mg total) by mouth every 8 (eight) hours as needed for moderate pain (pain score 4-6).  Dispense: 30 tablet; Refill: 0   Follow Up Instructions: I discussed the assessment and treatment plan with the patient. The patient was provided an opportunity to ask questions and all were answered. The patient agreed with the plan and demonstrated an understanding of the instructions.  A copy of instructions were sent to the patient via MyChart unless otherwise noted below.     The patient was advised to call back or seek an in-person evaluation if the symptoms worsen or if the condition fails to improve as anticipated.    Theresa Kitty, FNP  **Disclaimer: This note may have been dictated with voice recognition software. Similar sounding words can  inadvertently be transcribed and this note may contain transcription errors which may not have been corrected upon publication of note.**

## 2024-07-07 ENCOUNTER — Other Ambulatory Visit: Payer: Self-pay | Admitting: Nurse Practitioner

## 2024-07-07 DIAGNOSIS — K047 Periapical abscess without sinus: Secondary | ICD-10-CM

## 2024-07-07 MED ORDER — AMOXICILLIN 500 MG PO CAPS: 500.0000 mg | ORAL_CAPSULE | Freq: Three times a day (TID) | ORAL | 0 refills | Status: AC

## 2024-07-07 NOTE — Progress Notes (Signed)
 Patient messaged requesting Rx for recurrent dental infection. Currently without transportation to PCP or UC.   Advised to follow up at [facility] within the week and to schedule with dentist if able   No fever/swelling or systemic symptoms

## 2024-09-15 NOTE — Progress Notes (Signed)
 Pt attended 07/01/2024 [facility] Apppoinment.  Per initial f/u pt was reached out via phone and calls could not be completed. During appt provider noted, She has been receiving her medical care at the [facility] for the past years/ she is currently homeless. She also received SDOH needs at the [facility]. Her PCP is Ronal Jenkins Houseman and she is awaiting an appointment next month for follow up with her. Pt was mailed letter with food, transportation, & transportation resources, Get Care Now flyer, [facility] flyer, & [place] 211 card.  Per chart review pt does have a PCP (Mary An Placey; [facility] - no [facility]-visible visits), insurance, and is a smoker. Pt does indicate food, transportation, & utility SDOH needs at this time.  Additional pt f/u to be scheduled at this time per health equity protocol.

## 2024-09-20 ENCOUNTER — Other Ambulatory Visit: Payer: Self-pay | Admitting: *Deleted

## 2024-09-20 DIAGNOSIS — G8929 Other chronic pain: Secondary | ICD-10-CM

## 2024-09-20 DIAGNOSIS — G40919 Epilepsy, unspecified, intractable, without status epilepticus: Secondary | ICD-10-CM

## 2024-09-20 DIAGNOSIS — M545 Low back pain, unspecified: Secondary | ICD-10-CM

## 2024-09-20 MED ORDER — DIVALPROEX SODIUM 500 MG PO DR TAB: 500.0000 mg | DELAYED_RELEASE_TABLET | Freq: Two times a day (BID) | ORAL | 1 refills | Status: DC

## 2024-09-20 MED ORDER — AMOXICILLIN 500 MG PO CAPS: 500.0000 mg | ORAL_CAPSULE | Freq: Three times a day (TID) | ORAL | 0 refills | Status: AC

## 2024-09-20 MED ORDER — GABAPENTIN 300 MG PO CAPS: 300.0000 mg | ORAL_CAPSULE | Freq: Three times a day (TID) | ORAL | 0 refills | Status: DC | PRN

## 2024-09-20 MED ORDER — IBUPROFEN 800 MG PO TABS: 800.0000 mg | ORAL_TABLET | Freq: Three times a day (TID) | ORAL | 0 refills | Status: AC | PRN

## 2024-09-20 NOTE — Progress Notes (Signed)
 Seen for dental pain (d/t poor dentition).  Ok for round of antibiotics and Ibuprofen .  Needs to see the dentist.  CM will help her make an appointment.  She also needs a note to take to [facility] so that she can donate.  They usually have a form to complete. She will return tomorrow with it for me to complete

## 2024-09-28 ENCOUNTER — Other Ambulatory Visit: Payer: Self-pay | Admitting: *Deleted

## 2024-09-28 DIAGNOSIS — G8929 Other chronic pain: Secondary | ICD-10-CM

## 2024-09-28 MED ORDER — GABAPENTIN 300 MG PO CAPS: 300.0000 mg | ORAL_CAPSULE | Freq: Three times a day (TID) | ORAL | 0 refills | Status: DC | PRN

## 2024-09-28 NOTE — Progress Notes (Signed)
 She came by for a refill of Gaba (for chronic pain). Weight 148 with clothes on. Got contact info vision and dental referral. Ok for 30 day FU

## 2024-09-28 NOTE — Congregational Nurse Program (Signed)
" °  Dept: [phone]   Congregational Nurse Program Note  Date of Encounter: 09/28/2024  Past Medical History: Past Medical History:  Diagnosis Date   Anxiety state 03/21/2022   Cocaine use disorder, severe, dependence (HCC) 01/21/2023   Epilepsy, unspecified, intractable, without status epilepticus (HCC) 01/10/2024   Expressive aphasia 01/10/2024   Hepatic encephalopathy (HCC) 01/11/2024   Nicotine  use disorder 01/10/2024   Seizures (HCC)    Suicide attempt by drug ingestion (HCC) 03/19/2022    Encounter Details:  Community Questionnaire - 09/28/24 0940       Questionnaire   Ask client: Do you give verbal consent for me to treat you today? Yes    Student Assistance N/A    Location Patient Served  [facility]    Encounter Setting CN site    Population Status Unhoused    Insurance Medicaid    Insurance/Financial Assistance Referral N/A    Medication N/A    Medical Provider Yes    Medical Referrals Made NA    Medical Appointment Completed [facility]    Screenings CN Performed (remember to also record results) NA    CNP Interventions Case Management;Navigate Healthcare System;Advocate/Support;Health Counseling    ED Visit Averted N/A          RN met with client to discuss current needs. Client is in need of dentures and RN able to give client information on A1 dentures. Client also stated she is looking/interviewing for jobs, RN able to refer client to [employer]. All contact written down for client along with Number for medicaid medical rides for dental appointment. Client denies any other acute needs at this time. RN able to provide spiritual and emotional support.  "

## 2024-10-05 NOTE — Congregational Nurse Program (Signed)
" °  Dept: [phone]   Congregational Nurse Program Note  Date of Encounter: 10/05/2024  Past Medical History: Past Medical History:  Diagnosis Date   Anxiety state 03/21/2022   Cocaine use disorder, severe, dependence (HCC) 01/21/2023   Epilepsy, unspecified, intractable, without status epilepticus (HCC) 01/10/2024   Expressive aphasia 01/10/2024   Hepatic encephalopathy (HCC) 01/11/2024   Nicotine  use disorder 01/10/2024   Seizures (HCC)    Suicide attempt by drug ingestion (HCC) 03/19/2022    Encounter Details:  Community Questionnaire - 10/05/24 1330       Questionnaire   Ask client: Do you give verbal consent for me to treat you today? Yes    Student Assistance N/A    Location Patient Served  [facility]    Encounter Setting CN site    Population Status Unhoused    Insurance Medicaid    Insurance/Financial Assistance Referral N/A    Medication N/A    Medical Provider Yes    Medical Referrals Made NA    Medical Appointment Completed [facility] PCP[facility]inic    Screenings CN Performed (remember to also record results) NA    CN Interventions Case Management;Navigate Healthcare System;Advocate/Support;Health Counseling    ED Visit Averted N/A         Client to RN office needing assistance with medical records for applying for disability. RN able to print out last AVS from ER. RN also assisted client in filling out [facility] medical record form. RN faxed form to [facility] Records. Client aware this process can take up to 30 days. Client denies any other acute needs at this time. RN able to provide client with Spiritual and Emotional support.   "

## 2024-10-11 ENCOUNTER — Other Ambulatory Visit: Payer: Self-pay

## 2024-10-12 ENCOUNTER — Other Ambulatory Visit: Payer: Self-pay | Admitting: *Deleted

## 2024-10-12 DIAGNOSIS — G8929 Other chronic pain: Secondary | ICD-10-CM

## 2024-10-12 DIAGNOSIS — G40919 Epilepsy, unspecified, intractable, without status epilepticus: Secondary | ICD-10-CM

## 2024-10-12 DIAGNOSIS — M545 Low back pain, unspecified: Secondary | ICD-10-CM

## 2024-10-12 MED ORDER — DIVALPROEX SODIUM 500 MG PO DR TAB: 500.0000 mg | DELAYED_RELEASE_TABLET | Freq: Two times a day (BID) | ORAL | 1 refills | Status: AC

## 2024-10-12 MED ORDER — GABAPENTIN 300 MG PO CAPS: 300.0000 mg | ORAL_CAPSULE | Freq: Three times a day (TID) | ORAL | 0 refills | Status: AC | PRN

## 2024-10-12 MED ORDER — AMOXICILLIN 500 MG PO CAPS: 500.0000 mg | ORAL_CAPSULE | Freq: Three times a day (TID) | ORAL | 0 refills | Status: AC

## 2024-10-12 NOTE — Progress Notes (Unsigned)
 Requested refill for divalproex , and gaba. Has dental pain. Ok for amoxicillin .  Needs dental
